# Patient Record
Sex: Male | Born: 1965
Health system: Southern US, Community
[De-identification: ages and names within clinical notes are randomized; demographics above are authoritative.]

## PROBLEM LIST (undated history)

## (undated) DIAGNOSIS — I6523 Occlusion and stenosis of bilateral carotid arteries: Secondary | ICD-10-CM

## (undated) DIAGNOSIS — M170 Bilateral primary osteoarthritis of knee: Secondary | ICD-10-CM

## (undated) DIAGNOSIS — C801 Malignant (primary) neoplasm, unspecified: Secondary | ICD-10-CM

## (undated) DIAGNOSIS — R7303 Prediabetes: Secondary | ICD-10-CM

## (undated) DIAGNOSIS — K760 Fatty (change of) liver, not elsewhere classified: Secondary | ICD-10-CM

## (undated) DIAGNOSIS — F32A Depression, unspecified: Secondary | ICD-10-CM

## (undated) DIAGNOSIS — E119 Type 2 diabetes mellitus without complications: Secondary | ICD-10-CM

## (undated) DIAGNOSIS — F419 Anxiety disorder, unspecified: Secondary | ICD-10-CM

## (undated) DIAGNOSIS — G43909 Migraine, unspecified, not intractable, without status migrainosus: Secondary | ICD-10-CM

## (undated) DIAGNOSIS — G4730 Sleep apnea, unspecified: Secondary | ICD-10-CM

## (undated) DIAGNOSIS — R0789 Other chest pain: Secondary | ICD-10-CM

## (undated) DIAGNOSIS — K2970 Gastritis, unspecified, without bleeding: Secondary | ICD-10-CM

## (undated) DIAGNOSIS — K219 Gastro-esophageal reflux disease without esophagitis: Secondary | ICD-10-CM

## (undated) DIAGNOSIS — I428 Other cardiomyopathies: Secondary | ICD-10-CM

## (undated) DIAGNOSIS — E785 Hyperlipidemia, unspecified: Secondary | ICD-10-CM

## (undated) DIAGNOSIS — I509 Heart failure, unspecified: Secondary | ICD-10-CM

## (undated) DIAGNOSIS — D131 Benign neoplasm of stomach: Secondary | ICD-10-CM

## (undated) DIAGNOSIS — I10 Essential (primary) hypertension: Secondary | ICD-10-CM

## (undated) DIAGNOSIS — G894 Chronic pain syndrome: Secondary | ICD-10-CM

## (undated) DIAGNOSIS — R0602 Shortness of breath: Secondary | ICD-10-CM

## (undated) DIAGNOSIS — E669 Obesity, unspecified: Secondary | ICD-10-CM

## (undated) DIAGNOSIS — R7989 Other specified abnormal findings of blood chemistry: Secondary | ICD-10-CM

## (undated) DIAGNOSIS — I2510 Atherosclerotic heart disease of native coronary artery without angina pectoris: Secondary | ICD-10-CM

## (undated) DIAGNOSIS — I639 Cerebral infarction, unspecified: Secondary | ICD-10-CM

## (undated) DIAGNOSIS — G4733 Obstructive sleep apnea (adult) (pediatric): Secondary | ICD-10-CM

## (undated) DIAGNOSIS — R945 Abnormal results of liver function studies: Secondary | ICD-10-CM

## (undated) HISTORY — DX: Essential (primary) hypertension: I10

## (undated) HISTORY — DX: Fatty (change of) liver, not elsewhere classified: K76.0

## (undated) HISTORY — DX: Type 2 diabetes mellitus without complications: E11.9

## (undated) HISTORY — PX: CARDIAC CATHETERIZATION: SHX172

## (undated) HISTORY — PX: COLONOSCOPY: SHX174

## (undated) HISTORY — DX: Benign neoplasm of stomach: D13.1

## (undated) HISTORY — DX: Malignant (primary) neoplasm, unspecified: C80.1

## (undated) HISTORY — DX: Other specified abnormal findings of blood chemistry: R79.89

## (undated) HISTORY — DX: Gastritis, unspecified, without bleeding: K29.70

## (undated) HISTORY — DX: Other cardiomyopathies: I42.8

## (undated) HISTORY — DX: Gastro-esophageal reflux disease without esophagitis: K21.9

## (undated) HISTORY — DX: Abnormal results of liver function studies: R94.5

## (undated) HISTORY — DX: Obesity, unspecified: E66.9

## (undated) HISTORY — DX: Sleep apnea, unspecified: G47.30

## (undated) HISTORY — DX: Hyperlipidemia, unspecified: E78.5

---

## 2003-06-09 ENCOUNTER — Inpatient Hospital Stay (HOSPITAL_COMMUNITY): Admission: EM | Admit: 2003-06-09 | Discharge: 2003-06-13 | Payer: Self-pay | Admitting: *Deleted

## 2003-08-31 ENCOUNTER — Emergency Department (HOSPITAL_COMMUNITY): Admission: EM | Admit: 2003-08-31 | Discharge: 2003-08-31 | Payer: Self-pay | Admitting: Emergency Medicine

## 2006-02-03 DIAGNOSIS — I219 Acute myocardial infarction, unspecified: Secondary | ICD-10-CM

## 2006-02-03 HISTORY — DX: Acute myocardial infarction, unspecified: I21.9

## 2007-03-24 ENCOUNTER — Ambulatory Visit: Payer: Self-pay | Admitting: Gastroenterology

## 2008-05-14 ENCOUNTER — Emergency Department: Payer: Self-pay | Admitting: Emergency Medicine

## 2008-12-20 ENCOUNTER — Ambulatory Visit: Payer: Self-pay | Admitting: Cardiovascular Disease

## 2008-12-20 ENCOUNTER — Observation Stay (HOSPITAL_COMMUNITY): Admission: EM | Admit: 2008-12-20 | Discharge: 2009-01-01 | Payer: Self-pay | Admitting: Emergency Medicine

## 2008-12-22 ENCOUNTER — Encounter (INDEPENDENT_AMBULATORY_CARE_PROVIDER_SITE_OTHER): Payer: Self-pay | Admitting: Internal Medicine

## 2008-12-22 ENCOUNTER — Ambulatory Visit: Payer: Self-pay | Admitting: Vascular Surgery

## 2008-12-25 ENCOUNTER — Encounter: Admission: RE | Admit: 2008-12-25 | Discharge: 2008-12-25 | Payer: Self-pay | Admitting: Internal Medicine

## 2008-12-26 ENCOUNTER — Encounter: Admission: RE | Admit: 2008-12-26 | Discharge: 2008-12-26 | Payer: Self-pay | Admitting: Internal Medicine

## 2009-02-03 ENCOUNTER — Emergency Department: Payer: Self-pay | Admitting: Internal Medicine

## 2009-03-14 ENCOUNTER — Emergency Department: Payer: Self-pay | Admitting: Emergency Medicine

## 2009-04-02 ENCOUNTER — Ambulatory Visit: Payer: Self-pay | Admitting: Gastroenterology

## 2009-04-30 ENCOUNTER — Ambulatory Visit: Payer: Self-pay | Admitting: Gastroenterology

## 2009-10-09 ENCOUNTER — Observation Stay: Payer: Self-pay | Admitting: Internal Medicine

## 2009-10-29 ENCOUNTER — Ambulatory Visit: Payer: Self-pay | Admitting: Gastroenterology

## 2009-10-31 LAB — PATHOLOGY REPORT

## 2010-05-08 LAB — BASIC METABOLIC PANEL
BUN: 14 mg/dL (ref 6–23)
CO2: 26 mEq/L (ref 19–32)
CO2: 27 mEq/L (ref 19–32)
Calcium: 8.6 mg/dL (ref 8.4–10.5)
Calcium: 9.1 mg/dL (ref 8.4–10.5)
GFR calc Af Amer: >60 mL/min (ref >60)
GFR calc non Af Amer: >60 mL/min (ref >60)
GFR calc non Af Amer: >60 mL/min (ref >60)
Glucose, Bld: 155 mg/dL — ABNORMAL HIGH (ref 70–99)
Glucose, Bld: 99 mg/dL (ref 70–99)
Potassium: 4.2 mEq/L (ref 3.5–5.1)
Potassium: 4.5 mEq/L (ref 3.5–5.1)
Sodium: 138 mEq/L (ref 135–145)

## 2010-05-08 LAB — DIFFERENTIAL
Basophils Relative: 1 % (ref 0–1)
Eosinophils Absolute: 0.1 10*3/uL (ref 0.0–0.7)
Lymphocytes Relative: 32 % (ref 12–46)
Lymphs Abs: 2.5 10*3/uL (ref 0.7–4.0)
Monocytes Absolute: 0.7 10*3/uL (ref 0.1–1.0)
Monocytes Relative: 9 % (ref 3–12)

## 2010-05-08 LAB — COMPREHENSIVE METABOLIC PANEL
AST: 115 U/L — ABNORMAL HIGH (ref 0–37)
AST: 45 U/L — ABNORMAL HIGH (ref 0–37)
Albumin: 3.4 g/dL — ABNORMAL LOW (ref 3.5–5.2)
Albumin: 3.6 g/dL (ref 3.5–5.2)
Alkaline Phosphatase: 77 U/L (ref 39–117)
BUN: 14 mg/dL (ref 6–23)
BUN: 15 mg/dL (ref 6–23)
CO2: 24 mEq/L (ref 19–32)
Calcium: 8.4 mg/dL (ref 8.4–10.5)
Calcium: 9.3 mg/dL (ref 8.4–10.5)
Creatinine, Ser: 0.9 mg/dL (ref 0.4–1.5)
Creatinine, Ser: 1.06 mg/dL (ref 0.4–1.5)
Creatinine, Ser: 1.13 mg/dL (ref 0.4–1.5)
GFR calc Af Amer: >60 mL/min (ref >60)
GFR calc non Af Amer: >60 mL/min (ref >60)
Glucose, Bld: 167 mg/dL — ABNORMAL HIGH (ref 70–99)
Glucose, Bld: 92 mg/dL (ref 70–99)
Potassium: 4.7 mEq/L (ref 3.5–5.1)
Sodium: 135 mEq/L (ref 135–145)
Total Bilirubin: 1 mg/dL (ref 0.3–1.2)
Total Protein: 6.4 g/dL (ref 6.0–8.3)
Total Protein: 7.1 g/dL (ref 6.0–8.3)

## 2010-05-08 LAB — CBC
HCT: 40 % (ref 39.0–52.0)
HCT: 41.4 % (ref 39.0–52.0)
HCT: 42.2 % (ref 39.0–52.0)
HCT: 43 % (ref 39.0–52.0)
Hemoglobin: 13.9 g/dL (ref 13.0–17.0)
Hemoglobin: 14.5 g/dL (ref 13.0–17.0)
Hemoglobin: 14.5 g/dL (ref 13.0–17.0)
Hemoglobin: 14.6 g/dL (ref 13.0–17.0)
Hemoglobin: 15.2 g/dL (ref 13.0–17.0)
MCHC: 34.4 g/dL (ref 30.0–36.0)
MCHC: 34.5 g/dL (ref 30.0–36.0)
MCHC: 34.7 g/dL (ref 30.0–36.0)
MCHC: 34.7 g/dL (ref 30.0–36.0)
MCV: 88.9 fL (ref 78.0–100.0)
MCV: 89.2 fL (ref 78.0–100.0)
MCV: 89.2 fL (ref 78.0–100.0)
Platelets: 225 10*3/uL (ref 150–400)
Platelets: 256 10*3/uL (ref 150–400)
RBC: 4.5 MIL/uL (ref 4.22–5.81)
RBC: 4.7 MIL/uL (ref 4.22–5.81)
RBC: 4.73 MIL/uL (ref 4.22–5.81)
RBC: 4.92 MIL/uL (ref 4.22–5.81)
RDW: 13.6 % (ref 11.5–15.5)
RDW: 13.7 % (ref 11.5–15.5)
RDW: 13.9 % (ref 11.5–15.5)
RDW: 14 % (ref 11.5–15.5)
RDW: 14.2 % (ref 11.5–15.5)
WBC: 7.9 10*3/uL (ref 4.0–10.5)

## 2010-05-08 LAB — HEMOGLOBIN A1C: Mean Plasma Glucose: 128 mg/dL

## 2010-05-08 LAB — CARDIAC PANEL(CRET KIN+CKTOT+MB+TROPI)
CK, MB: 1.2 ng/mL (ref 0.3–4.0)
Relative Index: 0.9 (ref 0.0–2.5)
Relative Index: 1.1 (ref 0.0–2.5)
Troponin I: 0.01 ng/mL (ref 0.00–0.06)

## 2010-05-08 LAB — HEPATIC FUNCTION PANEL
ALT: 133 U/L — ABNORMAL HIGH (ref 0–53)
ALT: 190 U/L — ABNORMAL HIGH (ref 0–53)
AST: 107 U/L — ABNORMAL HIGH (ref 0–37)
AST: 46 U/L — ABNORMAL HIGH (ref 0–37)
Albumin: 3.8 g/dL (ref 3.5–5.2)
Alkaline Phosphatase: 78 U/L (ref 39–117)
Bilirubin, Direct: <0.1 mg/dL (ref 0.0–0.3)
Total Bilirubin: 0.7 mg/dL (ref 0.3–1.2)
Total Protein: 6.8 g/dL (ref 6.0–8.3)

## 2010-05-08 LAB — TSH: TSH: 6.901 u[IU]/mL — ABNORMAL HIGH (ref 0.350–4.500)

## 2010-05-08 LAB — LIPID PANEL
HDL: 34 mg/dL — ABNORMAL LOW (ref >39)
LDL Cholesterol: 146 mg/dL — ABNORMAL HIGH (ref 0–99)
Triglycerides: 144 mg/dL (ref <150)

## 2010-05-08 LAB — POCT CARDIAC MARKERS: Troponin i, poc: <0.05 ng/mL (ref 0.00–0.09)

## 2010-05-08 LAB — GAMMA GT: GGT: 45 U/L (ref 7–51)

## 2010-05-08 LAB — CK TOTAL AND CKMB (NOT AT ARMC)
CK, MB: 1.8 ng/mL (ref 0.3–4.0)
Relative Index: 1.2 (ref 0.0–2.5)
Total CK: 152 U/L (ref 7–232)

## 2010-06-21 NOTE — Discharge Summary (Signed)
NAME:  Gerald [name], Gerald [name] NO.:  [id number]   MEDICAL RECORD NO.:  [id number]                   PATIENT TYPE:  INP   LOCATION:  5036                                 FACILITY:  [facility]   PHYSICIAN:  Michael L. Thad Ranger, M.D.           DATE OF BIRTH:  Dec 05, 1965   DATE OF ADMISSION:  06/08/2003  DATE OF DISCHARGE:  06/13/2003                                 DISCHARGE SUMMARY   ADMISSION DIAGNOSES:  1. Headache, nausea, and visual changes, possible subarachnoid hemorrhage     versus migraine.  2. Subconjunctival hemorrhage.   DISCHARGE DIAGNOSES:  1. Paroxysmal unilateral headache, precise etiology uncertain.  Subarachnoid     hemorrhage excluded.  2. Subconjunctival hemorrhage.   CONDITION AT DISCHARGE:  Improved.   DIET:  Regular.   ACTIVITY:  Ad lib.   MEDICATIONS AT DISCHARGE:  1. Depakote ER 500 mg p.o. q.h.s.  2. Indocin 50 mg t.i.d. with food x 10 days.  3. Vicodin one to two q.4-6h. p.r.n. as needed.   STUDIES:  1. CT of the head, noncontrast, on Jun 08, 2003, with no abnormalities.  2. MRI of the brain on Jun 08, 2003, negative.  3. __________ intracranial circulation on Jun 08, 2003.  Slight narrowing in     the left ICA just below the skull base, otherwise negative.  4. Cerebral angiogram on Jun 09, 2003, negative.  5. EKG on Jun 10, 2003, normal.   LABORATORY REVIEW:  CBC unremarkable.  Sedimentation rate 6.  Coagulation  times unremarkable.  Chemistries unremarkable, except for an elevated  glucose likely due to steroids and elevated AST and ALT.  Compliment studies  were normal.  Rickettsia titers negative.  ANA negative.   PROCEDURES:  Lumbar puncture performed in the ER on Jun 08, 2003, with clear  fluid, 0-6 red cells, 1 white cell, and normal protein and glucose.   HOSPITAL COURSE:  Please see the admission H&P for full admission details.  Briefly, this is a 45 year old male who was admitted after experiencing  sudden onset of a  right hemicranial headache.  He simultaneously noted a  subconjunctival hemorrhage in the right eye.  His initially workup in the ER  included an extensive workup for subarachnoid hemorrhage which was negative.  Nevertheless, he was admitted and then underwent cerebral angiography the  next day which demonstrated no evidence of a cerebral aneurysm.  He was  placed on intravenous steroid and valproic with relief of his pain.  He  continued to have problems with severe headaches over the next couple of  days.  He was empirically tried on Indocin, which did result in some  improvement.  He was also felt to have significant underlying anxiety, which  was treated symptomatically.  By Jun 11, 2003, his headaches were getting  somewhat milder.  On Jun 11, 2003, because his headaches persisted, a trial  of DHU was undertaken and this resulted in considerable  improvement.  By Jun 13, 2003, his headache syndrome was well controlled and he was felt stable  for discharge.   FOLLOWUP:  He was advised to call [facility] for an appointment  with Darrol Angel, N.P./Dr. Thad Ranger in two weeks and to call sooner if  need be.                                                Michael L. Thad Ranger, M.D.    MLR/MEDQ  D:  07/28/2003  T:  07/30/2003  Job:  [id number]

## 2010-09-04 HISTORY — PX: CARDIAC CATHETERIZATION: SHX172

## 2010-09-09 ENCOUNTER — Observation Stay: Payer: Self-pay | Admitting: Student

## 2011-03-12 ENCOUNTER — Ambulatory Visit: Payer: Self-pay | Admitting: Cardiology

## 2011-04-15 ENCOUNTER — Ambulatory Visit: Payer: Self-pay | Admitting: Family Medicine

## 2011-05-25 ENCOUNTER — Emergency Department: Payer: Self-pay | Admitting: Emergency Medicine

## 2011-09-04 HISTORY — PX: CARDIAC CATHETERIZATION: SHX172

## 2011-09-12 ENCOUNTER — Observation Stay: Payer: Self-pay | Admitting: Internal Medicine

## 2011-09-12 LAB — CBC
MCH: 29.6 pg (ref 26.0–34.0)
MCHC: 35.1 g/dL (ref 32.0–36.0)
MCV: 84 fL (ref 80–100)
Platelet: 232 10*3/uL (ref 150–440)
RBC: 4.91 10*6/uL (ref 4.40–5.90)
RDW: 14.6 % — ABNORMAL HIGH (ref 11.5–14.5)

## 2011-09-12 LAB — CK TOTAL AND CKMB (NOT AT ARMC)
CK, Total: 164 U/L (ref 35–232)
CK, Total: 101 U/L (ref 35–232)
CK-MB: 1 ng/mL (ref 0.5–3.6)
CK-MB: 0.5 ng/mL (ref 0.5–3.6)

## 2011-09-12 LAB — BASIC METABOLIC PANEL
Calcium, Total: 9 mg/dL (ref 8.5–10.1)
EGFR (African American): >60
EGFR (Non-African Amer.): >60
Glucose: 131 mg/dL — ABNORMAL HIGH (ref 65–99)
Potassium: 3.7 mmol/L (ref 3.5–5.1)
Sodium: 140 mmol/L (ref 136–145)

## 2011-09-12 LAB — TROPONIN I
Troponin-I: <0.02 ng/mL
Troponin-I: <0.02 ng/mL

## 2011-09-12 LAB — APTT: Activated PTT: 30.6 secs (ref 23.6–35.9)

## 2011-09-13 LAB — CBC WITH DIFFERENTIAL/PLATELET
Basophil #: 0.1 10*3/uL (ref 0.0–0.1)
Basophil %: 0.7 %
Eosinophil %: 1 %
Lymphocyte #: 2.5 10*3/uL (ref 1.0–3.6)
MCH: 29.8 pg (ref 26.0–34.0)
MCHC: 34.9 g/dL (ref 32.0–36.0)
MCV: 85 fL (ref 80–100)
Monocyte %: 6.8 %
Neutrophil #: 5.3 10*3/uL (ref 1.4–6.5)
Neutrophil %: 61.6 %
Platelet: 218 10*3/uL (ref 150–440)
RBC: 4.83 10*6/uL (ref 4.40–5.90)
RDW: 15.1 % — ABNORMAL HIGH (ref 11.5–14.5)
WBC: 8.5 10*3/uL (ref 3.8–10.6)

## 2011-09-13 LAB — LIPID PANEL
Ldl Cholesterol, Calc: 115 mg/dL — ABNORMAL HIGH (ref 0–100)
Triglycerides: 137 mg/dL (ref 0–200)

## 2011-09-13 LAB — HEMOGLOBIN A1C: Hemoglobin A1C: 6.8 % — ABNORMAL HIGH (ref 4.2–6.3)

## 2011-09-13 LAB — BASIC METABOLIC PANEL
Anion Gap: 7 (ref 7–16)
Calcium, Total: 8.3 mg/dL — ABNORMAL LOW (ref 8.5–10.1)
Co2: 28 mmol/L (ref 21–32)
EGFR (African American): >60
EGFR (Non-African Amer.): >60
Glucose: 114 mg/dL — ABNORMAL HIGH (ref 65–99)
Osmolality: 272 (ref 275–301)

## 2011-09-13 LAB — MAGNESIUM: Magnesium: 1.9 mg/dL

## 2011-09-13 LAB — APTT: Activated PTT: 68.2 secs — ABNORMAL HIGH (ref 23.6–35.9)

## 2012-03-30 ENCOUNTER — Encounter: Payer: Self-pay | Admitting: Family Medicine

## 2012-03-30 ENCOUNTER — Ambulatory Visit (INDEPENDENT_AMBULATORY_CARE_PROVIDER_SITE_OTHER): Payer: 59 | Admitting: Family Medicine

## 2012-03-30 VITALS — BP 140/90 | HR 76 | Temp 97.7°F | Ht 72.5 in | Wt 323.0 lb

## 2012-03-30 DIAGNOSIS — Z136 Encounter for screening for cardiovascular disorders: Secondary | ICD-10-CM

## 2012-03-30 DIAGNOSIS — G5790 Unspecified mononeuropathy of unspecified lower limb: Secondary | ICD-10-CM

## 2012-03-30 DIAGNOSIS — G894 Chronic pain syndrome: Secondary | ICD-10-CM

## 2012-03-30 DIAGNOSIS — M792 Neuralgia and neuritis, unspecified: Secondary | ICD-10-CM

## 2012-03-30 DIAGNOSIS — I2510 Atherosclerotic heart disease of native coronary artery without angina pectoris: Secondary | ICD-10-CM | POA: Insufficient documentation

## 2012-03-30 DIAGNOSIS — I10 Essential (primary) hypertension: Secondary | ICD-10-CM

## 2012-03-30 LAB — COMPREHENSIVE METABOLIC PANEL
AST: 30 U/L (ref 0–37)
BUN: 15 mg/dL (ref 6–23)
Calcium: 8.8 mg/dL (ref 8.4–10.5)
Chloride: 104 mEq/L (ref 96–112)
Creatinine, Ser: 1 mg/dL (ref 0.4–1.5)
GFR: 82.85 mL/min (ref >60.00)

## 2012-03-30 LAB — LIPID PANEL
Cholesterol: 199 mg/dL (ref 0–200)
Triglycerides: 276 mg/dL — ABNORMAL HIGH (ref 0.0–149.0)
VLDL: 55.2 mg/dL — ABNORMAL HIGH (ref 0.0–40.0)

## 2012-03-30 LAB — LDL CHOLESTEROL, DIRECT: Direct LDL: 132.1 mg/dL

## 2012-03-30 MED ORDER — METOPROLOL SUCCINATE ER 25 MG PO TB24: 25.0000 mg | ORAL_TABLET | Freq: Every day | ORAL | Status: DC

## 2012-03-30 MED ORDER — ASPIRIN 81 MG PO TBEC: 81.0000 mg | DELAYED_RELEASE_TABLET | Freq: Every day | ORAL | Status: DC

## 2012-03-30 NOTE — Patient Instructions (Addendum)
We are starting Metoprolol 25 mg daily and Aspirin 81 mg daily( over the counter).  I will call you with your lab results. We will likely start cholesterol medication.  We will call you with your lab results.  Please come see me in 2 weeks.

## 2012-03-30 NOTE — Progress Notes (Signed)
Subjective:    Patient ID: Gerald [name], male    DOB: 1965/10/16, 47 y.o.   MRN: [id number]  HPI 47 yo male here to establish care.  Has not been to a PCP in years.  CAD- per pt, presented to [facility] in 09/2011.  He was told he had a heart attack and had a heart cath which he states looked "pretty good." Never followed up with cardiology and is not taking any medication.  Since then, he has had intermittent episodes of DOE, fleeting exertional CP. Pos FH of CAD- dad had MI in his 5s. Non smoker.  Under significant stress- wife is on disability for bipolar disorder.  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 They have a 51 year old son.  He denies feeling depressed or anxious- has a strong faith.  Does complain of tingling in his feet bilaterally for years.  Patient Active Problem List  Diagnosis  . CAD (coronary artery disease)  . Chronic pain syndrome  . HTN (hypertension)   Past Medical History  Diagnosis Date  . Myocardial infarction 09/2011  . Hypertension    Past Surgical History  Procedure Laterality Date  . Cardiac catheterization N/A 09/2011   History  Substance Use Topics  . Smoking status: Never Smoker   . Smokeless tobacco: Not on file  . Alcohol Use: Not on file   Family History  Problem Relation Age of Onset  . Heart disease Father 6    MI   No Known Allergies No current outpatient prescriptions on file prior to visit.   No current facility-administered medications on file prior to visit.   The PMH, PSH, Social History, Family History, Medications, and allergies have been reviewed in [facility], and have been updated if relevant.      Review of Systems See HPI Patient reports no  vision/ hearing changes,anorexia, weight change, fever ,adenopathy, persistant / recurrent hoarseness, swallowing issues, , edema,persistant / recurrent cough, hemoptysis, dyspnea(rest, exertional, paroxysmal nocturnal),  gastrointestinal  bleeding (melena, rectal bleeding), abdominal pain, excessive heart burn, GU symptoms(dysuria, hematuria, pyuria, voiding/incontinence  Issues) syncope, focal weakness, severe memory loss, concerning skin lesions, depression, anxiety, abnormal bruising/bleeding, major joint swelling.       Objective:   Physical Exam BP 140/90  Pulse 76  Temp(Src) 97.7 F (36.5 C)  Ht 6' 0.5" (1.842 m)  Wt 323 lb (146.512 kg)  BMI 43.18 kg/m2 General:  overweght male in NAD Eyes:  PERRL Ears:  External ear exam shows no significant lesions or deformities.  Otoscopic examination reveals clear canals, tympanic membranes are intact bilaterally without bulging, retraction, inflammation or discharge. Hearing is grossly normal bilaterally. Nose:  External nasal examination shows no deformity or inflammation. Nasal mucosa are pink and moist without lesions or exudates. Mouth:  Oral mucosa and oropharynx without lesions or exudates.  Teeth in good repair. Neck:  no carotid bruit or thyromegaly no cervical or supraclavicular lymphadenopathy  Lungs:  Normal respiratory effort, chest expands symmetrically. Lungs are clear to auscultation, no crackles or wheezes. Heart:  Normal rate and regular rhythm. S1 and S2 normal without gallop, murmur, click, rub or other extra sounds. Abdomen:  Bowel sounds positive,abdomen soft and non-tender without masses, organomegaly or hernias noted. Pulses:  R and L posterior tibial pulses are full and equal bilaterally  Extremities:  no edema     Assessment & Plan:  1. CAD (coronary artery disease) On no RX! Will start metoprolol 25  mg ER daily, ASA 81 mg daily.  Lipid panel today.  Records from [facility] requested. - Lipid Panel - Comprehensive metabolic panel  2. Screening for ischemic heart disease - Lipid Panel  3. Neuropathic pain of foot, unspecified laterality - Hemoglobin A1c  4. Chronic pain syndrome On no rx  5. HTN (hypertension) On no rx.  Start  Metoprolol today.  Follow up with me in 2 weeks. The patient indicates understanding of these issues and agrees with the plan.

## 2012-04-15 ENCOUNTER — Telehealth: Payer: Self-pay

## 2012-04-15 ENCOUNTER — Ambulatory Visit (INDEPENDENT_AMBULATORY_CARE_PROVIDER_SITE_OTHER): Payer: 59 | Admitting: Family Medicine

## 2012-04-15 ENCOUNTER — Encounter: Payer: Self-pay | Admitting: Family Medicine

## 2012-04-15 VITALS — BP 140/80 | HR 83 | Temp 98.0°F | Ht 72.5 in | Wt 323.0 lb

## 2012-04-15 DIAGNOSIS — E119 Type 2 diabetes mellitus without complications: Secondary | ICD-10-CM

## 2012-04-15 DIAGNOSIS — I10 Essential (primary) hypertension: Secondary | ICD-10-CM

## 2012-04-15 DIAGNOSIS — I2510 Atherosclerotic heart disease of native coronary artery without angina pectoris: Secondary | ICD-10-CM

## 2012-04-15 MED ORDER — METFORMIN HCL 500 MG PO TABS: 500.0000 mg | ORAL_TABLET | Freq: Two times a day (BID) | ORAL | Status: DC

## 2012-04-15 MED ORDER — METOPROLOL SUCCINATE ER 25 MG PO TB24: 25.0000 mg | ORAL_TABLET | Freq: Every day | ORAL | Status: DC

## 2012-04-15 NOTE — Telephone Encounter (Signed)
Noted! Thank you

## 2012-04-15 NOTE — Patient Instructions (Signed)
Good to see you. We are starting Metformin 500 mg twice daily. Continue Metoprolol. Please come back in 3 months- we will recheck you a1c and cholesterol prior to that appointment.

## 2012-04-15 NOTE — Progress Notes (Signed)
Subjective:    Patient ID: Gerald [name], male    DOB: 1965-04-07, 47 y.o.   MRN: [id number]  HPI 47 yo male with h/o CAD and HTN, here for two week follow up.  Established care with me after not having seen a PCP in years.    HTN- BP only mildly improved since we started metoprolol 25 mg daily but he has not taken his medication today and rushed to get here. Denies any HA or blurred vision and states that he feels his BP is better controlled.   CAD- per pt, presented to [facility] in 09/2011. He was told he had a heart attack and had a heart cath which he states looked "pretty good."  Never followed up with cardiology and is not taking any medication. Since then, he has had intermittent episodes of DOE, fleeting exertional CP.  Pos FH of CAD- dad had MI in his 44s.  Non smoker. Was previously on a statin, cannot remember which one- gave him a rash. Lab Results  Component Value Date   CHOL 199 03/30/2012   HDL 30.10* 03/30/2012   LDLCALC  Value: 146        Total Cholesterol/HDL:CHD Risk Coronary Heart Disease Risk Table                     Men   Women  1/2 Average Risk   3.4   3.3  Average Risk       5.0   4.4  2 X Average Risk   9.6   7.1  3 X Average Risk  23.4   11.0        Use the calculated Patient Ratio above and the CHD Risk Table to determine the patient's CHD Risk.        ATP III CLASSIFICATION (LDL):  <100     mg/dL   Optimal  409-811  mg/dL   Near or Above                    Optimal  130-159  mg/dL   Borderline  914-782  mg/dL   High  >956     mg/dL   Very High* 21/30/8657   LDLDIRECT 132.1 03/30/2012   TRIG 276.0* 03/30/2012   CHOLHDL 7 03/30/2012     BP was elevated and he was on no rx for CAD.  We started Metoprolol 25 mg ER daily and ASA.  Here is here for follow up.    DM- now onset.  a1c two weeks ago was 6.8.  Denies any increased thirst or urination. Wife is a diabetic.  He has an extra meter and test strips at home.    Patient Active Problem List  Diagnosis  . CAD (coronary  artery disease)  . Chronic pain syndrome  . HTN (hypertension)   Past Medical History  Diagnosis Date  . Myocardial infarction 09/2011  . Hypertension    Past Surgical History  Procedure Laterality Date  . Cardiac catheterization N/A 09/2011   History  Substance Use Topics  . Smoking status: Never Smoker   . Smokeless tobacco: Not on file  . Alcohol Use: Not on file   Family History  Problem Relation Age of Onset  . Heart disease Father 56    MI   No Known Allergies Current Outpatient Prescriptions on File Prior to Visit  Medication Sig Dispense Refill  . aspirin 81 MG EC tablet Take 1 tablet (81 mg total) by mouth daily. Swallow  whole.  30 tablet  12  . metoprolol succinate (TOPROL-XL) 25 MG 24 hr tablet Take 1 tablet (25 mg total) by mouth daily.  90 tablet  3   No current facility-administered medications on file prior to visit.   The PMH, PSH, Social History, Family History, Medications, and allergies have been reviewed in [facility], and have been updated if relevant.      Review of Systems See HPI See HPI    Objective:   Physical Exam  BP 140/80  Pulse 83  Temp(Src) 98 F (36.7 C) (Oral)  Ht 6' 0.5" (1.842 m)  Wt 323 lb (146.512 kg)  BMI 43.18 kg/m2  SpO2 97%  General:  overweght male in NAD Eyes:  PERRL Ears:  External ear exam shows no significant lesions or deformities.  Otoscopic examination reveals clear canals, tympanic membranes are intact bilaterally without bulging, retraction, inflammation or discharge. Hearing is grossly normal bilaterally. Nose:  External nasal examination shows no deformity or inflammation. Nasal mucosa are pink and moist without lesions or exudates. Mouth:  Oral mucosa and oropharynx without lesions or exudates.  Teeth in good repair. Neck:  no carotid bruit or thyromegaly no cervical or supraclavicular lymphadenopathy  Lungs:  Normal respiratory effort, chest expands symmetrically. Lungs are clear to auscultation, no crackles or  wheezes. Heart:  Normal rate and regular rhythm. S1 and S2 normal without gallop, murmur, click, rub or other extra sounds. Abdomen:  Bowel sounds positive,abdomen soft and non-tender without masses, organomegaly or hernias noted. Pulses:  R and L posterior tibial pulses are full and equal bilaterally  Extremities:  no edema     Assessment & Plan:  1. CAD (coronary artery disease) Continue  metoprolol 25 mg ER daily, ASA 81 mg daily.   Will hold off on starting statin since he had a rash with one- he will call me back once he finds out which one it was.  2. HTN (hypertension) Stable on current dose of Metoprolol.   3. Diabetes mellitus, new onset New- declined diabetic teaching.  Discussed ranges and checking CBGs 3 times week and keeping log. Start Metformin 500 mg twice daily.  Follow up in 3 months. The patient indicates understanding of these issues and agrees with the plan.  - Hemoglobin A1c; Future

## 2012-04-15 NOTE — Telephone Encounter (Signed)
Pt seen this morning and when got home pt said already has Metformin 500 mg at home and will begin to take as instructed. No call back needed.

## 2012-05-07 ENCOUNTER — Encounter: Payer: Self-pay | Admitting: Family Medicine

## 2012-05-07 ENCOUNTER — Ambulatory Visit (INDEPENDENT_AMBULATORY_CARE_PROVIDER_SITE_OTHER): Payer: 59 | Admitting: Family Medicine

## 2012-05-07 ENCOUNTER — Telehealth: Payer: Self-pay

## 2012-05-07 VITALS — BP 130/72 | HR 80 | Temp 97.9°F | Ht 72.5 in | Wt 321.8 lb

## 2012-05-07 DIAGNOSIS — J209 Acute bronchitis, unspecified: Secondary | ICD-10-CM | POA: Insufficient documentation

## 2012-05-07 MED ORDER — AZITHROMYCIN 250 MG PO TABS: ORAL_TABLET | ORAL | Status: DC

## 2012-05-07 MED ORDER — BENZONATATE 100 MG PO CAPS: 200.0000 mg | ORAL_CAPSULE | Freq: Three times a day (TID) | ORAL | Status: DC | PRN

## 2012-05-07 NOTE — Telephone Encounter (Signed)
For 1 1/2 weeks pt had cough; OTC meds not helping. Today pt has consistent prod cough with yellow phlegm and pain on lt side of chest when coughs.No S/T or earache; ? If fever. Pt thinks may have gone to bronchitis and request appt. Dr Dayton Martes does not have available appt today and pt scheduled appt Dr Ermalene Searing today at 10:45 am.

## 2012-05-07 NOTE — Assessment & Plan Note (Addendum)
Treat with antibiotics since > 1 week of symptoms.  Tessalon perles for symptoms.  Zyrtec for any allergy contribution.

## 2012-05-07 NOTE — Patient Instructions (Addendum)
Complete a course of antibiotics.   Use tessalon perles for cough Try claritin or zyrtec fpr possible allergy contribution. Avoid decongestant. Call or follow up with PCP if not improving in next 5-7 days.

## 2012-05-07 NOTE — Progress Notes (Signed)
  Subjective:    Patient ID: Gerald [name], male    DOB: February 14, 1965, 47 y.o.   MRN: [id number]  Cough This is a new problem. The current episode started in the past 7 days (1 week). The problem has been gradually worsening. The problem occurs constantly. The cough is productive of sputum. Pertinent negatives include no chills, ear congestion, ear pain, fever, headaches, nasal congestion, postnasal drip, rhinorrhea, sore throat, shortness of breath or wheezing. Associated symptoms comments: Some SOB with coughing fits where he cannot stop . Exacerbated by: keeping him up when trying to sleep. Risk factors: Has flu in January. Treatments tried: Delsym, took some decongestant.. had BP elevation with this. The treatment provided mild relief. There is no history of asthma, COPD, emphysema, environmental allergies or pneumonia.    When he has been sick in past he gets some wheeze... Tried old albuterol inhaler that did not help with coughing fits.  No reflux, no allergies this time of years. Review of Systems  Constitutional: Negative for fever and chills.  HENT: Negative for ear pain, sore throat, rhinorrhea and postnasal drip.   Respiratory: Positive for cough. Negative for shortness of breath and wheezing.   Allergic/Immunologic: Negative for environmental allergies.  Neurological: Negative for headaches.       Objective:   Physical Exam  Constitutional: Vital signs are normal. He appears well-developed and well-nourished.  Non-toxic appearance. He does not appear ill. No distress.  HENT:  Head: Normocephalic and atraumatic.  Right Ear: Hearing, tympanic membrane, external ear and ear canal normal. No tenderness. No foreign bodies. Tympanic membrane is not retracted and not bulging.  Left Ear: Hearing, tympanic membrane, external ear and ear canal normal. No tenderness. No foreign bodies. Tympanic membrane is not retracted and not bulging.  Nose: Nose normal. No mucosal edema or rhinorrhea.  Right sinus exhibits no maxillary sinus tenderness and no frontal sinus tenderness. Left sinus exhibits no maxillary sinus tenderness and no frontal sinus tenderness.  Mouth/Throat: Uvula is midline, oropharynx is clear and moist and mucous membranes are normal. Normal dentition. No dental caries. No oropharyngeal exudate or tonsillar abscesses.  Eyes: Conjunctivae, EOM and lids are normal. Pupils are equal, round, and reactive to light. No foreign bodies found.  Neck: Trachea normal, normal range of motion and phonation normal. Neck supple. Carotid bruit is not present. No mass and no thyromegaly present.  Cardiovascular: Normal rate, regular rhythm, S1 normal, S2 normal, normal heart sounds, intact distal pulses and normal pulses.  Exam reveals no gallop.   No murmur heard. Pulmonary/Chest: Effort normal and breath sounds normal. No respiratory distress. He has no wheezes. He has no rhonchi. He has no rales.  Abdominal: Soft. Normal appearance and bowel sounds are normal. There is no hepatosplenomegaly. There is no tenderness. There is no rebound, no guarding and no CVA tenderness. No hernia.  Neurological: He is alert. He has normal reflexes.  Skin: Skin is warm, dry and intact. No rash noted.  Psychiatric: He has a normal mood and affect. His speech is normal and behavior is normal. Judgment normal.          Assessment & Plan:

## 2012-05-11 ENCOUNTER — Ambulatory Visit: Payer: 59 | Admitting: Family Medicine

## 2012-05-14 ENCOUNTER — Emergency Department: Payer: Self-pay | Admitting: Emergency Medicine

## 2012-05-14 ENCOUNTER — Telehealth: Payer: Self-pay | Admitting: Family Medicine

## 2012-05-14 NOTE — Telephone Encounter (Signed)
Yes ok to accommodate sooner.

## 2012-05-14 NOTE — Telephone Encounter (Signed)
Pt sch for CPE 05/28/2012

## 2012-05-14 NOTE — Telephone Encounter (Signed)
Pt needs to have a CPE and some paperwork completed for his employer (he works for [employer]) by no later than June 4th, however, your first available CPE is not until 07/08/2012. Can you accommodate him an apptmt prior to June 4th? Thank you.

## 2012-05-20 ENCOUNTER — Other Ambulatory Visit (INDEPENDENT_AMBULATORY_CARE_PROVIDER_SITE_OTHER): Payer: 59

## 2012-05-20 DIAGNOSIS — I2510 Atherosclerotic heart disease of native coronary artery without angina pectoris: Secondary | ICD-10-CM

## 2012-05-20 DIAGNOSIS — E119 Type 2 diabetes mellitus without complications: Secondary | ICD-10-CM

## 2012-05-20 LAB — LIPID PANEL
LDL Cholesterol: 132 mg/dL — ABNORMAL HIGH (ref 0–99)
Total CHOL/HDL Ratio: 7
Triglycerides: 145 mg/dL (ref 0.0–149.0)
VLDL: 29 mg/dL (ref 0.0–40.0)

## 2012-05-24 ENCOUNTER — Encounter: Payer: Self-pay | Admitting: *Deleted

## 2012-05-28 ENCOUNTER — Encounter: Payer: Self-pay | Admitting: Family Medicine

## 2012-05-28 ENCOUNTER — Telehealth: Payer: Self-pay

## 2012-05-28 ENCOUNTER — Ambulatory Visit (INDEPENDENT_AMBULATORY_CARE_PROVIDER_SITE_OTHER): Payer: 59 | Admitting: Family Medicine

## 2012-05-28 VITALS — BP 138/94 | HR 84 | Temp 98.0°F | Ht 73.0 in | Wt 320.0 lb

## 2012-05-28 DIAGNOSIS — E782 Mixed hyperlipidemia: Secondary | ICD-10-CM | POA: Insufficient documentation

## 2012-05-28 DIAGNOSIS — G894 Chronic pain syndrome: Secondary | ICD-10-CM

## 2012-05-28 DIAGNOSIS — E785 Hyperlipidemia, unspecified: Secondary | ICD-10-CM

## 2012-05-28 DIAGNOSIS — I10 Essential (primary) hypertension: Secondary | ICD-10-CM

## 2012-05-28 DIAGNOSIS — Z0000 Encounter for general adult medical examination without abnormal findings: Secondary | ICD-10-CM

## 2012-05-28 DIAGNOSIS — E119 Type 2 diabetes mellitus without complications: Secondary | ICD-10-CM

## 2012-05-28 DIAGNOSIS — I2510 Atherosclerotic heart disease of native coronary artery without angina pectoris: Secondary | ICD-10-CM

## 2012-05-28 MED ORDER — GLUCOSE BLOOD VI STRP: ORAL_STRIP | Status: DC

## 2012-05-28 MED ORDER — ONETOUCH ULTRA SYSTEM W/DEVICE KIT: 1.0000 | PACK | Freq: Once | Status: DC

## 2012-05-28 NOTE — Telephone Encounter (Signed)
Advised patient that he needs to see cardiologist to get cardiac clearance.  Dr. Dayton Martes can then finish filling out the forms.  Pt agreed to go to cardiologist.

## 2012-05-28 NOTE — Progress Notes (Signed)
Subjective:    Patient ID: Gerald [name], male    DOB: December 09, 1965, 47 y.o.   MRN: [id number]  HPI 47 yo male with h/o DM, CAD and HTN, here for CPX.  No family history of colon CA or prostate CA.  HTN- BP improved since we started metoprolol 25 mg daily, almost at goal for diabetic. Denies any HA or blurred vision and states that he feels his BP is better controlled.  CAD- per pt, presented to [facility] in 09/2011. He was told he had a heart attack and had a heart cath which he states looked "pretty good."   Pos FH of CAD- dad had MI in his 32s.   No recent CP or DOE.   Non smoker. Was previously on a statin, cannot remember which one- gave him a rash.  He is now taking ASA. Lab Results  Component Value Date   CHOL 190 05/20/2012   HDL 28.70* 05/20/2012   LDLCALC 132* 05/20/2012   LDLDIRECT 132.1 03/30/2012   TRIG 145.0 05/20/2012   CHOLHDL 7 05/20/2012     DM- now onset.  a1c was 6.8 at diagnosis in March, improved to 6.6 this month.   Denies any increased thirst or urination. On Metformin 500 mg twice daily. Has not been checking his CBGs. Denies episodes of hypoglycemia.  Patient Active Problem List  Diagnosis  . CAD (coronary artery disease)  . Chronic pain syndrome  . HTN (hypertension)  . Diabetes mellitus, new onset  . Routine general medical examination at a health care facility   Past Medical History  Diagnosis Date  . Myocardial infarction 09/2011  . Hypertension    Past Surgical History  Procedure Laterality Date  . Cardiac catheterization N/A 09/2011   History  Substance Use Topics  . Smoking status: Never Smoker   . Smokeless tobacco: Not on file  . Alcohol Use: Not on file   Family History  Problem Relation Age of Onset  . Heart disease Father 86    MI   Allergies  Allergen Reactions  . Statins     Rash    Current Outpatient Prescriptions on File Prior to Visit  Medication Sig Dispense Refill  . aspirin 81 MG EC tablet Take 1 tablet (81 mg total)  by mouth daily. Swallow whole.  30 tablet  12  . azithromycin (ZITHROMAX) 250 MG tablet 2 tab po x 1 day then 1 tab po daily  6 each  0  . benzonatate (TESSALON) 100 MG capsule Take 2 capsules (200 mg total) by mouth 3 (three) times daily as needed for cough.  30 capsule  0  . metFORMIN (GLUCOPHAGE) 500 MG tablet Take 1 tablet (500 mg total) by mouth 2 (two) times daily with a meal.  180 tablet  3  . metoprolol succinate (TOPROL-XL) 25 MG 24 hr tablet Take 1 tablet (25 mg total) by mouth daily.  90 tablet  3   No current facility-administered medications on file prior to visit.   The PMH, PSH, Social History, Family History, Medications, and allergies have been reviewed in [facility], and have been updated if relevant.      Review of Systems See HPI Patient reports no  vision/ hearing changes,anorexia, weight change, fever ,adenopathy, persistant / recurrent hoarseness, swallowing issues, chest pain, edema,persistant / recurrent cough, hemoptysis, dyspnea(rest, exertional, paroxysmal nocturnal), gastrointestinal  bleeding (melena, rectal bleeding), abdominal pain, excessive heart burn, GU symptoms(dysuria, hematuria, pyuria, voiding/incontinence  Issues) syncope, focal weakness, severe memory loss, concerning skin lesions,  depression, anxiety, abnormal bruising/bleeding, major joint swelling.       Objective:   Physical Exam  BP 138/94  Pulse 84  Temp(Src) 98 F (36.7 C)  Ht 6\' 1"  (1.854 m)  Wt 320 lb (145.151 kg)  BMI 42.23 kg/m2  General:  overweght male in NAD Eyes:  PERRL Ears:  External ear exam shows no significant lesions or deformities.  Otoscopic examination reveals clear canals, tympanic membranes are intact bilaterally without bulging, retraction, inflammation or discharge. Hearing is grossly normal bilaterally. Nose:  External nasal examination shows no deformity or inflammation. Nasal mucosa are pink and moist without lesions or exudates. Mouth:  Oral mucosa and oropharynx  without lesions or exudates.  Teeth in good repair. Neck:  no carotid bruit or thyromegaly no cervical or supraclavicular lymphadenopathy  Lungs:  Normal respiratory effort, chest expands symmetrically. Lungs are clear to auscultation, no crackles or wheezes. Heart:  Normal rate and regular rhythm. S1 and S2 normal without gallop, murmur, click, rub or other extra sounds. Abdomen:  Bowel sounds positive,abdomen soft and non-tender without masses, organomegaly or hernias noted. Pulses:  R and L posterior tibial pulses are full and equal bilaterally  Extremities:  no edema     Assessment & Plan:   1. Routine general medical examination at a health care facility Reviewed preventive care protocols, scheduled due services, and updated immunizations Discussed nutrition, exercise, diet, and healthy lifestyle. 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   2. Diabetes mellitus, new onset Continue Metformin 500 mg twice daily. a1c in July. Not checking CBGs because he states he does not have enough strips ( was using his wife's strips)- he will call back with name of meter to get test strips sent in.  3. HTN (hypertension) Almost at goal for diabetic.  Continue Metoprolol at current dose.  4. CAD (coronary artery disease) Continue  metoprolol 25 mg ER daily, ASA 81 mg daily.   Will hold off on starting statin since he had a rash with statins previously.  Refer to cardiology for further management.  - Ambulatory referral to Cardiology  5. HLD (hyperlipidemia) Not at goal for diabetic or CAD but intolerant to statins.  Referred to cards for CAD.  Will await their recs. The patient indicates understanding of these issues and agrees with the plan.  - Ambulatory referral to Cardiology

## 2012-05-28 NOTE — Telephone Encounter (Signed)
Gerald [name] wants to talk with Dr Dayton Martes about paperwork Gerald [name] brought in today; Gerald [name] said had spoken with [employer] about paperwork and big difference in what Gerald [name] has to do depending if box is marked yes or no. Several boxes Dr Dayton Martes did not place a mark and Gerald [name] wants to speak with Dr Dayton Martes or Gerald [name] can bring paperwork back to office to discuss. Gerald [name] request call back ASAP.

## 2012-05-28 NOTE — Telephone Encounter (Signed)
Gerald [name], please call pt to find out what we need to do. Thanks

## 2012-05-28 NOTE — Patient Instructions (Addendum)
Good to see you. Please start checking your blood sugars three times a week if possible. See Shirlee Limerick about your referral before you leave today. Once you see the cardiologist, bring your form back.

## 2012-05-31 ENCOUNTER — Encounter: Payer: Self-pay | Admitting: *Deleted

## 2012-06-01 ENCOUNTER — Encounter: Payer: Self-pay | Admitting: Cardiovascular Disease

## 2012-06-01 ENCOUNTER — Ambulatory Visit (INDEPENDENT_AMBULATORY_CARE_PROVIDER_SITE_OTHER): Payer: 59 | Admitting: Cardiovascular Disease

## 2012-06-01 VITALS — BP 132/100 | HR 73 | Ht 76.0 in | Wt 327.5 lb

## 2012-06-01 DIAGNOSIS — I428 Other cardiomyopathies: Secondary | ICD-10-CM | POA: Insufficient documentation

## 2012-06-01 DIAGNOSIS — E785 Hyperlipidemia, unspecified: Secondary | ICD-10-CM

## 2012-06-01 DIAGNOSIS — I2510 Atherosclerotic heart disease of native coronary artery without angina pectoris: Secondary | ICD-10-CM

## 2012-06-01 DIAGNOSIS — I10 Essential (primary) hypertension: Secondary | ICD-10-CM

## 2012-06-01 NOTE — Assessment & Plan Note (Signed)
Lab Results  Component Value Date   CHOL 190 05/20/2012   HDL 28.70* 05/20/2012   LDLCALC 132* 05/20/2012   LDLDIRECT 132.1 03/30/2012   TRIG 145.0 05/20/2012   CHOLHDL 7 05/20/2012   Unfortunately, he is allergic to statins due to rash. Continue with lifestyle changes at this point. I don't think there is strong evidence for benefit from non-statin medications in this situation.

## 2012-06-01 NOTE — Assessment & Plan Note (Signed)
He was reported to have moderately reduced LV systolic function but there is discrepancy in estimation of this between echocardiogram and cardiac catheterization. He is currently not having any symptoms suggestive of heart failure and does not seem to be fluid overloaded. Continue treatment with Toprol. Consider adding an ACE inhibitor given that he is also diabetic. I will plan on repeating his echocardiogram in about 6 months to evaluate his LV systolic function. He currently appears to be in [place] Heart Association class II with no significant limitations.

## 2012-06-01 NOTE — Patient Instructions (Addendum)
Continue same medications.  Follow up in 6 months.  

## 2012-06-01 NOTE — Progress Notes (Signed)
HPI  This is a 47 year old male who was referred by Dr. Dayton Martes to establish cardiovascular care. He use to see Dr.Khan in the past. He had cardiac catheterization done twice one in August of 2012 and most recently in August of 2013 for atypical chest pain. Both showed mild mid LAD stenosis without evidence of obstructive disease. He was reported to have reduced ejection fraction. Echocardiogram from last year showed an ejection fraction of 35-40%. By cardiac cath ejection fraction was 50%. The patient denies any chest pain, dyspnea, orthopnea or PND. There is no lower extremity edema. He is not aware of any previous history of congestive heart. He was diagnosed recently with diabetes. He has known history of hypertension and currently on small dose Toprol.  Allergies  Allergen Reactions  . Statins     Rash      Current Outpatient Prescriptions on File Prior to Visit  Medication Sig Dispense Refill  . aspirin 81 MG EC tablet Take 1 tablet (81 mg total) by mouth daily. Swallow whole.  30 tablet  12  . Blood Glucose Monitoring Suppl (ONE TOUCH ULTRA SYSTEM KIT) W/DEVICE KIT 1 kit by Does not apply route once.  1 each  0  . glucose blood test strip Use as instructed  100 each  12  . metFORMIN (GLUCOPHAGE) 500 MG tablet Take 1 tablet (500 mg total) by mouth 2 (two) times daily with a meal.  180 tablet  3  . metoprolol succinate (TOPROL-XL) 25 MG 24 hr tablet Take 1 tablet (25 mg total) by mouth daily.  90 tablet  3   No current facility-administered medications on file prior to visit.     Past Medical History  Diagnosis Date  . Myocardial infarction 09/2011  . Hypertension   . Hyperlipidemia   . Obesity   . Diabetes mellitus without complication     borderline  . Sleep apnea   . Coronary artery disease     Mild nonobstructive coronary artery disease on previous cardiac catheterization most recently in August of 2013  . Nonischemic cardiomyopathy     Previous ejection fraction of  35-40% on echo. 50% on cardiac catheterization in August of 2013     Past Surgical History  Procedure Laterality Date  . Colonoscopy    . Cardiac catheterization N/A 09/2011    [facility]; EF 50% with 30% mid LAD stenosis and no obstructive disease.  . Cardiac catheterization  09/2010    [facility]; Mid LAD 40% stenosis; Mid Circumflex:Normal; Mid RCA; Normal     Family History  Problem Relation Age of Onset  . Heart disease Father 74    MI  . Heart attack Father   . Hypertension Mother      History   Social History  . Marital Status: Single    Spouse Name: N/A    Number of Children: N/A  . Years of Education: N/A   Occupational History  . Not on file.   Social History Main Topics  . Smoking status: Never Smoker   . Smokeless tobacco: Not on file  . Alcohol Use: Yes     Comment: rare  . Drug Use: No  . Sexually Active: Not on file   Other Topics Concern  . Not on file   Social History Narrative   Married.  35 yo son.   Wife on disability for bipolar disorder.       ROS Constitutional: Negative for fever, chills, diaphoresis, activity change, appetite change and fatigue.  HENT: Negative for hearing loss, nosebleeds, congestion, sore throat, facial swelling, drooling, trouble swallowing, neck pain, voice change, sinus pressure and tinnitus.  Eyes: Negative for photophobia, pain, discharge and visual disturbance.  Respiratory: Negative for apnea, cough, chest tightness, shortness of breath and wheezing.  Cardiovascular: Negative for chest pain, palpitations and leg swelling.  Gastrointestinal: Negative for nausea, vomiting, abdominal pain, diarrhea, constipation, blood in stool and abdominal distention.  Genitourinary: Negative for dysuria, urgency, frequency, hematuria and decreased urine volume.  Musculoskeletal: Negative for myalgias, back pain, joint swelling, arthralgias and gait problem.  Skin: Negative for color change, pallor, rash and wound.  Neurological:  Negative for dizziness, tremors, seizures, syncope, speech difficulty, weakness, light-headedness, numbness and headaches.  Psychiatric/Behavioral: Negative for suicidal ideas, hallucinations, behavioral problems and agitation. The patient is not nervous/anxious.     PHYSICAL EXAM   BP 132/100  Pulse 73  Ht 6\' 4"  (1.93 m)  Wt 327 lb 8 oz (148.553 kg)  BMI 39.88 kg/m2 Constitutional: He is oriented to person, place, and time. He appears well-developed and well-nourished. No distress.  HENT: No nasal discharge.  Head: Normocephalic and atraumatic.  Eyes: Pupils are equal and round. Right eye exhibits no discharge. Left eye exhibits no discharge.  Neck: Normal range of motion. Neck supple. No JVD present. No thyromegaly present.  Cardiovascular: Normal rate, regular rhythm, normal heart sounds and. Exam reveals no gallop and no friction rub. No murmur heard.  Pulmonary/Chest: Effort normal and breath sounds normal. No stridor. No respiratory distress. He has no wheezes. He has no rales. He exhibits no tenderness.  Abdominal: Soft. Bowel sounds are normal. He exhibits no distension. There is no tenderness. There is no rebound and no guarding.  Musculoskeletal: Normal range of motion. He exhibits no edema and no tenderness.  Neurological: He is alert and oriented to person, place, and time. Coordination normal.  Skin: Skin is warm and dry. No rash noted. He is not diaphoretic. No erythema. No pallor.  Psychiatric: He has a normal mood and affect. His behavior is normal. Judgment and thought content normal.       EKG: Normal sinus rhythm with no significant ST or T wave changes.   ASSESSMENT AND PLAN

## 2012-06-01 NOTE — Assessment & Plan Note (Signed)
The patient had mild disease in the LAD on previous cardiac catheterization. Continue small dose aspirin. Unfortunately, he is allergic to statins due to rash reported with 3 different statins. He is currently not having any symptoms suggestive of angina.

## 2012-06-01 NOTE — Assessment & Plan Note (Signed)
Consider adding an ACE inhibitor or an ARB as outlined above

## 2012-06-03 ENCOUNTER — Emergency Department (HOSPITAL_COMMUNITY): Payer: 59

## 2012-06-03 ENCOUNTER — Encounter (HOSPITAL_COMMUNITY): Payer: Self-pay

## 2012-06-03 ENCOUNTER — Observation Stay (HOSPITAL_COMMUNITY)
Admission: EM | Admit: 2012-06-03 | Discharge: 2012-06-04 | Disposition: A | Discharge status: Home / Self Care | Payer: 59 | Attending: Cardiology | Admitting: Cardiology

## 2012-06-03 ENCOUNTER — Telehealth: Payer: Self-pay | Admitting: *Deleted

## 2012-06-03 DIAGNOSIS — I10 Essential (primary) hypertension: Secondary | ICD-10-CM | POA: Diagnosis present

## 2012-06-03 DIAGNOSIS — R0609 Other forms of dyspnea: Secondary | ICD-10-CM

## 2012-06-03 DIAGNOSIS — I5023 Acute on chronic systolic (congestive) heart failure: Secondary | ICD-10-CM

## 2012-06-03 DIAGNOSIS — I2510 Atherosclerotic heart disease of native coronary artery without angina pectoris: Secondary | ICD-10-CM | POA: Diagnosis present

## 2012-06-03 DIAGNOSIS — Z6841 Body Mass Index (BMI) 40.0 and over, adult: Secondary | ICD-10-CM | POA: Insufficient documentation

## 2012-06-03 DIAGNOSIS — E669 Obesity, unspecified: Secondary | ICD-10-CM | POA: Insufficient documentation

## 2012-06-03 DIAGNOSIS — E785 Hyperlipidemia, unspecified: Secondary | ICD-10-CM | POA: Insufficient documentation

## 2012-06-03 DIAGNOSIS — I509 Heart failure, unspecified: Secondary | ICD-10-CM

## 2012-06-03 DIAGNOSIS — R0989 Other specified symptoms and signs involving the circulatory and respiratory systems: Secondary | ICD-10-CM | POA: Insufficient documentation

## 2012-06-03 DIAGNOSIS — I428 Other cardiomyopathies: Secondary | ICD-10-CM | POA: Insufficient documentation

## 2012-06-03 DIAGNOSIS — R079 Chest pain, unspecified: Principal | ICD-10-CM | POA: Diagnosis present

## 2012-06-03 DIAGNOSIS — I252 Old myocardial infarction: Secondary | ICD-10-CM | POA: Insufficient documentation

## 2012-06-03 DIAGNOSIS — R609 Edema, unspecified: Secondary | ICD-10-CM | POA: Insufficient documentation

## 2012-06-03 DIAGNOSIS — E782 Mixed hyperlipidemia: Secondary | ICD-10-CM | POA: Diagnosis present

## 2012-06-03 DIAGNOSIS — E119 Type 2 diabetes mellitus without complications: Secondary | ICD-10-CM | POA: Insufficient documentation

## 2012-06-03 LAB — COMPREHENSIVE METABOLIC PANEL
ALT: 57 U/L — ABNORMAL HIGH (ref 0–53)
AST: 30 U/L (ref 0–37)
Albumin: 3.7 g/dL (ref 3.5–5.2)
Alkaline Phosphatase: 90 U/L (ref 39–117)
Potassium: 3.9 mEq/L (ref 3.5–5.1)
Sodium: 140 mEq/L (ref 135–145)
Total Protein: 7 g/dL (ref 6.0–8.3)

## 2012-06-03 LAB — URINALYSIS, ROUTINE W REFLEX MICROSCOPIC
Glucose, UA: NEGATIVE mg/dL
Hgb urine dipstick: NEGATIVE
Ketones, ur: NEGATIVE mg/dL
Leukocytes, UA: NEGATIVE
pH: 5 (ref 5.0–8.0)

## 2012-06-03 LAB — GLUCOSE, CAPILLARY: Glucose-Capillary: 128 mg/dL — ABNORMAL HIGH (ref 70–99)

## 2012-06-03 LAB — CBC
MCHC: 35.6 g/dL (ref 30.0–36.0)
Platelets: 211 10*3/uL (ref 150–400)
RDW: 14 % (ref 11.5–15.5)
WBC: 7 10*3/uL (ref 4.0–10.5)

## 2012-06-03 LAB — POCT I-STAT TROPONIN I

## 2012-06-03 MED ORDER — ASPIRIN 81 MG PO TBEC
81.0000 mg | DELAYED_RELEASE_TABLET | Freq: Every day | ORAL | Status: DC
  Administered 2012-06-04: 81 mg via ORAL
  Filled 2012-06-03: qty 1

## 2012-06-03 MED ORDER — ENOXAPARIN SODIUM 40 MG/0.4ML ~~LOC~~ SOLN
40.0000 mg | SUBCUTANEOUS | Status: DC
  Administered 2012-06-03: 40 mg via SUBCUTANEOUS
  Filled 2012-06-03 (×2): qty 0.4

## 2012-06-03 MED ORDER — MORPHINE SULFATE 4 MG/ML IJ SOLN
4.0000 mg | Freq: Once | INTRAMUSCULAR | Status: AC
  Administered 2012-06-03: 4 mg via INTRAVENOUS
  Filled 2012-06-03: qty 1

## 2012-06-03 MED ORDER — ACETAMINOPHEN 325 MG PO TABS: 650.0000 mg | ORAL_TABLET | ORAL | Status: DC | PRN

## 2012-06-03 MED ORDER — SODIUM CHLORIDE 0.9 % IV SOLN
20.0000 mL | INTRAVENOUS | Status: DC
  Administered 2012-06-03: 1000 mL via INTRAVENOUS
  Administered 2012-06-03: 20 mL via INTRAVENOUS

## 2012-06-03 MED ORDER — FUROSEMIDE 10 MG/ML IJ SOLN
40.0000 mg | Freq: Two times a day (BID) | INTRAMUSCULAR | Status: DC
  Administered 2012-06-03 – 2012-06-04 (×3): 40 mg via INTRAVENOUS
  Filled 2012-06-03 (×5): qty 4

## 2012-06-03 MED ORDER — METOPROLOL SUCCINATE ER 25 MG PO TB24
25.0000 mg | ORAL_TABLET | Freq: Every day | ORAL | Status: DC
  Administered 2012-06-03: 25 mg via ORAL
  Filled 2012-06-03 (×2): qty 1

## 2012-06-03 MED ORDER — NITROGLYCERIN IN D5W 200-5 MCG/ML-% IV SOLN
2.0000 ug/min | INTRAVENOUS | Status: DC
  Administered 2012-06-04: 10 ug/min via INTRAVENOUS
  Filled 2012-06-03: qty 250

## 2012-06-03 MED ORDER — NITROGLYCERIN 0.4 MG SL SUBL
SUBLINGUAL_TABLET | SUBLINGUAL | Status: AC
  Filled 2012-06-03: qty 25

## 2012-06-03 MED ORDER — LISINOPRIL 5 MG PO TABS
5.0000 mg | ORAL_TABLET | Freq: Every day | ORAL | Status: DC
  Administered 2012-06-03: 5 mg via ORAL
  Filled 2012-06-03 (×2): qty 1

## 2012-06-03 MED ORDER — ONDANSETRON HCL 4 MG/2ML IJ SOLN: 4.0000 mg | Freq: Four times a day (QID) | INTRAMUSCULAR | Status: DC | PRN

## 2012-06-03 MED ORDER — MORPHINE SULFATE 4 MG/ML IJ SOLN
4.0000 mg | INTRAMUSCULAR | Status: DC | PRN
  Administered 2012-06-03 – 2012-06-04 (×2): 4 mg via INTRAVENOUS
  Filled 2012-06-03 (×3): qty 1

## 2012-06-03 MED ORDER — POTASSIUM CHLORIDE CRYS ER 20 MEQ PO TBCR
20.0000 meq | EXTENDED_RELEASE_TABLET | Freq: Two times a day (BID) | ORAL | Status: DC
  Administered 2012-06-03 – 2012-06-04 (×3): 20 meq via ORAL
  Filled 2012-06-03 (×5): qty 1

## 2012-06-03 MED ORDER — NITROGLYCERIN 0.4 MG SL SUBL
SUBLINGUAL_TABLET | SUBLINGUAL | Status: AC
  Administered 2012-06-03: 18:00:00
  Filled 2012-06-03: qty 25

## 2012-06-03 NOTE — ED Notes (Signed)
sts went to cardiologist 2 days ago for routine visit.

## 2012-06-03 NOTE — ED Notes (Signed)
Pt sts his chest pain has returned.  md aware.  New orders obtained.

## 2012-06-03 NOTE — ED Notes (Signed)
EMS-pt was at home feeding dog developed worsening chest pressure/shortness of breath.  sts that he has c/p that started yesterday am at work.  Feels more like chest pressure radiating to left arm.  enroute was given 8mg  morphine, nitro x 5, ASA 324 at home.  Pain now 4/10. 18g/rac.  Hx of same last August with cath but no stent was placed.

## 2012-06-03 NOTE — Telephone Encounter (Signed)
Increased sob and chest heaviness. Would like to talk to RN asap

## 2012-06-03 NOTE — Telephone Encounter (Signed)
Pt c/o chest heaviness, "like someone is sitting on my chest" associated with SOB.  Has felt this way since 0200 4/30 Also c/o left arm numbness I discussed with Dr. Kirke Corin who advised to have pt go to ER Pt informed Understanding verb

## 2012-06-03 NOTE — ED Provider Notes (Signed)
History     CSN: [id number]  Arrival date & time 06/03/12  1038   First MD Initiated Contact with Patient 06/03/12 1058      Chief Complaint  Patient presents with  . Chest Pain    (Consider location/radiation/quality/duration/timing/severity/associated sxs/prior treatment) HPI Comments: Gerald [name] is a 47 y.o. Male reports waxing and waning chest sugars since yesterday morning. It gets worse when he is working. Today he is having severe chest pain, 10 out of 10, and call his cardiologist. They recommended that he come to the hospital. Shortly after that he collapsed to the floor. His wife found him shortly thereafter, and he was alert. He was sitting with his back against the wall. She gave him 4 baby aspirin. EMS arrived and gave him nitroglycerin. The chest pain has decreased to 4/10, on arrival.recently he has noticed general tiredness, and dyspnea on exertion when walking. He saw his cardiologist, 2 days ago, to establish care. No interventions were done. His cardiologist plans on doing a cardiac echo in 6 months to followup on an ejection fraction of 50% on his last cardiac catheterization. His last cardiac catheterization was in 2013 and showed nonobstructive coronary artery disease. He works as an Nature conservation officer at [employer]. There's been no recent fever, chills, nausea, vomiting, cough, dysuria, or diarrhea. No other known modifying factors.  Patient is a 47 y.o. male presenting with chest pain. The history is provided by the patient.  Chest Pain   Past Medical History  Diagnosis Date  . Myocardial infarction 09/2011  . Hypertension   . Hyperlipidemia   . Obesity   . Diabetes mellitus without complication     borderline  . Sleep apnea   . Coronary artery disease     Mild nonobstructive coronary artery disease on previous cardiac catheterization most recently in August of 2013  . Nonischemic cardiomyopathy     Previous ejection fraction of 35-40% on echo. 50%  on cardiac catheterization in August of 2013    Past Surgical History  Procedure Laterality Date  . Colonoscopy    . Cardiac catheterization N/A 09/2011    [facility]; EF 50% with 30% mid LAD stenosis and no obstructive disease.  . Cardiac catheterization  09/2010    [facility]; Mid LAD 40% stenosis; Mid Circumflex:Normal; Mid RCA; Normal    Family History  Problem Relation Age of Onset  . Heart disease Father 71    MI  . Heart attack Father   . Hypertension Mother     History  Substance Use Topics  . Smoking status: Never Smoker   . Smokeless tobacco: Not on file  . Alcohol Use: Yes     Comment: rare      Review of Systems  Cardiovascular: Positive for chest pain.  All other systems reviewed and are negative.    Allergies  Statins  Home Medications   Current Outpatient Rx  Name  Route  Sig  Dispense  Refill  . aspirin 81 MG EC tablet   Oral   Take 81 mg by mouth daily. Swallow whole.         . metFORMIN (GLUCOPHAGE) 500 MG tablet   Oral   Take 1 tablet (500 mg total) by mouth 2 (two) times daily with a meal.   180 tablet   3   . metoprolol succinate (TOPROL-XL) 25 MG 24 hr tablet   Oral   Take 1 tablet (25 mg total) by mouth daily.   90 tablet  3   . Blood Glucose Monitoring Suppl (ONE TOUCH ULTRA SYSTEM KIT) W/DEVICE KIT   Does not apply   1 kit by Does not apply route once.   1 each   0   . glucose blood test strip      Use as instructed   100 each   12     BP 154/86  Pulse 77  Temp(Src) 97.8 F (36.6 C) (Oral)  Resp 19  SpO2 96%  Physical Exam  Nursing note and vitals reviewed. Constitutional: He is oriented to person, place, and time. He appears well-developed.  obese  HENT:  Head: Normocephalic and atraumatic.  Right Ear: External ear normal.  Left Ear: External ear normal.  Eyes: Conjunctivae and EOM are normal. Pupils are equal, round, and reactive to light.  Neck: Normal range of motion and phonation normal. Neck supple.   Cardiovascular: Normal rate, regular rhythm, normal heart sounds and intact distal pulses.   Pulmonary/Chest: Effort normal and breath sounds normal. No respiratory distress. He has no wheezes. He has no rales. He exhibits tenderness (Mild left anterior, and left posterior chest wall tenderness). He exhibits no bony tenderness.  Abdominal: Soft. Normal appearance. There is no tenderness.  Musculoskeletal: Normal range of motion. He exhibits edema (2+ bilateral lower legs).  Neurological: He is alert and oriented to person, place, and time. He has normal strength. No cranial nerve deficit or sensory deficit. He exhibits normal muscle tone. Coordination normal.  Skin: Skin is warm, dry and intact.  Psychiatric: His behavior is normal. Judgment and thought content normal.  He talks slowly, and appears depressed    ED Course  Procedures (including critical care time)  Medications  0.9 %  sodium chloride infusion (20 mLs Intravenous New Bag/Given 06/03/12 1215)  morphine 4 MG/ML injection 4 mg (4 mg Intravenous Given 06/03/12 1214)    12:10-patient requested pain medicine, morphine ordered.   Reevaluation- 12:50- he states the morphine helped some, but his pain is still present at 5/10.   Consultation- 12:55- [place] cardiologist, will see the patient.   Date: 10:48  Rate: 76  Rhythm: normal sinus rhythm  QRS Axis: normal  PR and QT Intervals: normal  ST/T Wave abnormalities: normal  PR and QRS Conduction Disutrbances:none  Narrative Interpretation:   Old EKG Reviewed: unchanged- 09/08/10   Labs Reviewed  CBC - Abnormal; Notable for the following:    HCT 37.6 (*)    All other components within normal limits  COMPREHENSIVE METABOLIC PANEL - Abnormal; Notable for the following:    Glucose, Bld 111 (*)    ALT 57 (*)    GFR calc non Af Amer 87 (*)    All other components within normal limits  URINALYSIS, ROUTINE W REFLEX MICROSCOPIC - Abnormal; Notable for the following:    Specific  Gravity, Urine 1.033 (*)    All other components within normal limits  D-DIMER, QUANTITATIVE  POCT I-STAT TROPONIN I   Dg Chest Portable 1 View  06/03/2012  *RADIOLOGY REPORT*  Clinical Data: Chest pain, shortness of breath  PORTABLE CHEST - 1 VIEW  Comparison: 12/20/2008  Findings: Borderline cardiomegaly.  Elevation of the right hemidiaphragm.  No acute infiltrate or pleural effusion.  No pulmonary edema.  IMPRESSION: Borderline cardiomegaly.  No active disease.   Original Report Authenticated By: Natasha Mead, M.D.      1. Chest pain   2. Dyspnea on exertion   3. Peripheral edema       MDM  Nonspecific  chest pain, atypical for coronary artery disease, and infarct pain. Initial evaluation in the ED, negative; pain persists. Cardiology consultation indicated. To see the patient in the ED.  Nursing Notes Reviewed/ Care Coordinated, and agree without changes. Applicable Imaging Reviewed.  Interpretation of Laboratory Data incorporated into ED treatment  Plan: As per cardiology        Flint Melter, MD 06/03/12 1256

## 2012-06-03 NOTE — H&P (Signed)
History and Physical  Patient ID: Gerald [name] MRN: [id number], SOB: 01/07/1966 46 y.o. Date of Encounter: 06/03/2012, 2:28 PM  Primary Physician: Ruthe Mannan, MD Primary Cardiologist: Dr. Kary Kos  Chief Complaint: Chest pain  HPI: 47 y.o. male w/ PMHx significant for HTN, HL, DM2, OSA, CAD, who presented to [facility] on 06/03/2012 with complaints of chest pain.  The patient notes 24 hours of constant, dull, left-sided "pressure"-like chest pain, radiating to his back, 7/10 in severity, mildly worse when sitting down, unchanged by exertion, unchanged by food.  Today, at 9:30 am, while at rest, the patient experienced sudden onset of acute chest pain, described as left-sided chest pain "like someone sitting on my chest", radiating to his left posterior shoulder and left arm with associated left hand numbness, 10/10 in severity, associated with dyspnea, nausea, diaphoresis, and confusion.  The patient took 4 baby aspirin.  The pain persisted until EMS arrived and administered sublingual nitro, which decreased the pain initially to an 8/10, then further to a 5/10, where it has remained since that time.  The patient notes a 1-week history of SOB when walking 1 block, which is new for the patient.  At baseline, the patient notes that his job requires him to walk on flat ground for most of his daily 12-hour shift, and he notes no cp or SOB with this activity typically.  He notes stable 2-pillow orthopnea for > 1 year, and PND about once every 2 weeks (stable for > 1 year).  He notes mild LE edema for the past 1 year.    The patient has a history of CAD, with cardiac cath 09/12/11 showing 30-40% stenosis at the LAD, circumflex, and RAD, with 2 stents noted in LAD and circumflex.    EKG revealed NSR with no acute ST changes. CXR was without acute cardiopulmonary abnormalities. Labs are significant for negative troponin x1.   Past Medical History  Diagnosis Date  . Myocardial infarction 09/2011  .  Hypertension   . Hyperlipidemia   . Obesity   . Diabetes mellitus without complication     borderline  . Sleep apnea   . Coronary artery disease     Mild nonobstructive coronary artery disease on previous cardiac catheterization most recently in August of 2013  . Nonischemic cardiomyopathy     Previous ejection fraction of 35-40% on echo. 50% on cardiac catheterization in August of 2013     Surgical History:  Past Surgical History  Procedure Laterality Date  . Colonoscopy    . Cardiac catheterization N/A 09/2011    [facility]; EF 50% with 30% mid LAD stenosis and no obstructive disease.  . Cardiac catheterization  09/2010    [facility]; Mid LAD 40% stenosis; Mid Circumflex:Normal; Mid RCA; Normal     Home Meds: Prior to Admission medications   Medication Sig Start Date End Date Taking? Authorizing Provider  aspirin 81 MG EC tablet Take 81 mg by mouth daily. Swallow whole. 03/30/12  Yes Dianne Dun, MD  metFORMIN (GLUCOPHAGE) 500 MG tablet Take 1 tablet (500 mg total) by mouth 2 (two) times daily with a meal. 04/15/12  Yes Dianne Dun, MD  metoprolol succinate (TOPROL-XL) 25 MG 24 hr tablet Take 1 tablet (25 mg total) by mouth daily. 04/15/12  Yes Dianne Dun, MD  Blood Glucose Monitoring Suppl (ONE TOUCH ULTRA SYSTEM KIT) W/DEVICE KIT 1 kit by Does not apply route once. 05/28/12   Dianne Dun, MD  glucose blood test strip  Use as instructed 05/28/12   Dianne Dun, MD    Allergies:  Allergies  Allergen Reactions  . Statins     Rash     History   Social History  . Marital Status: Single    Spouse Name: N/A    Number of Children: N/A  . Years of Education: N/A   Occupational History  . Not on file.   Social History Main Topics  . Smoking status: Never Smoker   . Smokeless tobacco: Not on file  . Alcohol Use: Yes     Comment: rare  . Drug Use: No  . Sexually Active: Not on file   Other Topics Concern  . Not on file   Social History Narrative   Married.  21 yo son.   Wife  on disability for bipolar disorder.       Family History  Problem Relation Age of Onset  . Heart disease Father 46    MI  . Heart attack Father   . Hypertension Mother     Review of Systems: General: negative for chills, fever, night sweats or weight changes.  ENT: negative for rhinorrhea or epistaxis Cardiovascular: see HPI Dermatological: negative for rash Respiratory: negative for cough or wheezing GI: +occasional heartburn, negative for nausea, vomiting, diarrhea, bright red blood per rectum, melena, or hematemesis GU: no hematuria, urgency, or frequency Neurologic: negative for visual changes, syncope, headache, or dizziness Heme: no easy bruising or bleeding Endo: negative for excessive thirst, thyroid disorder, or flushing Musculoskeletal: negative for joint pain or swelling, negative for myalgias All other systems reviewed and are otherwise negative except as noted above.  Physical Exam: Blood pressure 154/86, pulse 77, temperature 97.8 F (36.6 C), temperature source Oral, resp. rate 19, SpO2 96.00%. General: Well developed, well nourished, lying in bed, appears uncomfortable HEENT: Normocephalic, atraumatic, sclera non-icteric, no xanthomas, nares are without discharge.  Neck: Supple. Carotids 2+ without bruits. JVP normal Lungs: mildly increased work of respiration, particularly after talking or moving for physical exam.  Clear bilaterally to auscultation without wheezes, rales, or rhonchi. Heart: RRR with normal S1 and S2. No murmurs, rubs, or gallops appreciated. Abdomen: Soft, moderate tenderness to epigastric palpation, non-distended with normoactive bowel sounds. No hepatomegaly. No rebound/guarding. No obvious abdominal masses. Back: No CVA tenderness Msk:  Strength and tone appear normal for age. Extremities: trace bilateral non-pitting edema.  Distal pedal pulses are 2+ and equal bilaterally. Neuro: CNII-XII intact, moves all extremities spontaneously. Psych:   Responds to questions appropriately with a normal affect.   Labs:   Lab Results  Component Value Date   WBC 7.0 06/03/2012   HGB 13.4 06/03/2012   HCT 37.6* 06/03/2012   MCV 82.5 06/03/2012   PLT 211 06/03/2012    Recent Labs Lab 06/03/12 1122  NA 140  K 3.9  CL 104  CO2 28  BUN 16  CREATININE 1.02  CALCIUM 9.3  PROT 7.0  BILITOT 0.6  ALKPHOS 90  ALT 57*  AST 30  GLUCOSE 111*   No results found for this basename: CKTOTAL, CKMB, TROPONINI,  in the last 72 hours Lab Results  Component Value Date   CHOL 190 05/20/2012   HDL 28.70* 05/20/2012   LDLCALC 132* 05/20/2012   TRIG 145.0 05/20/2012   Lab Results  Component Value Date   DDIMER 0.27 06/03/2012  Troponin: 0.00  Hb A1C: 6.6 (05/20/12)  Radiology/Studies:  Dg Chest Portable 1 View  06/03/2012  *RADIOLOGY REPORT*  Clinical Data: Chest pain,  shortness of breath  PORTABLE CHEST - 1 VIEW  Comparison: 12/20/2008  Findings: Borderline cardiomegaly.  Elevation of the right hemidiaphragm.  No acute infiltrate or pleural effusion.  No pulmonary edema.  IMPRESSION: Borderline cardiomegaly.  No active disease.   Original Report Authenticated By: Natasha Mead, M.D.    Echo Report 09/12/11 - EF 35-45%, trace MR, mild septal hypokinesis   EKG: NSR, non-specific T-wave changes in III, avf  ASSESSMENT AND PLAN:  The patient is a 47 yo M, history of HTN, HL, DM, presenting with chest pain.  # Chest pain - the patient notes sudden onset of left-sided chest pain, with anginal qualities (left-sided pressure, radiating to left arm, relieved by nitro).  The patient has risk factors of HTN, HL, FH (father MI age 32), as well as DM (A1C = 6.6, 05/20/12) and prior 30-40% stenosis.  His TIMI score is 2 (CAD RF's, aspirin), giving him an 8.3% risk of adverse event at 14 days.  Differential diagnosis includes unstable angina vs early NSTEMI vs GERD (pt notes heartburn, epigastric tenderness on exam) vs CHF (EF 35-45%, but stable orthopnea, no pulm edema on CXR)  vs pericarditis/pericardial effusion (though no EKG changes to suggest pericarditis).  No evidence of PE (d-dimer negative, low prob wells score).  Unlikely aortic dissection (based on description of pain). -admit to telemetry -continue to cycle troponins -repeat EKG in AM -continue aspirin daily -continue patient's home metoprolol -pt has statin allergy -continue morphine prn, oxygen  # Loss of consciousness - pt notes LOC after bending over while having CP.  The etiology of this is unclear, but may have represented neurocardiogenic syncope in the setting of bending over while having acute pain and distress.  No evidence for arrythmia vs tamponade vs PE. -continue to monitor via telemetry  # HTN - chronic, BP mildly elevated presently -continue home metoprolol -if BP remains elevated, add lisinopril  # HL - recent lipid panel 2 weeks ago shows LDL = 132, HDL = 28.7.  10-year ASCVD risk = 10.4%, patient is a candidate for high intensity statin, but has allergy to statin.  # DM - A1C = 6.6, patient on metformin outpatient therapy -hold metformin -monitor CBG's, no insulin for now, can add later if CBG's elevated  # OSA - patient notes history of sleep study diagnosing him with OSA, though currently not on CPAP  # Prophy - heparin  Signed, Janalyn Harder PGY2 06/03/2012, 2:28 PM  Patient seen with resident, agree with the above note. Patient has had chest heaviness for about a day now, troponin and D dimer negative, ECG unremarkable.   1. Chest heaviness: 2 caths (2012 and 2013) with nonobstructive CAD.  Suspect this is not ACS as he has had prolonged pain with normal enzymes and unremarkable ECG.  I will continue to cycle cardiac enzymes, continue ASA.   2. CHF: Suspect acute on chronic systolic CHF.  Last echo with EF 35-40% (nonischemic cardiomyopathy).  He has elevated JVP and peripheral edema.  CHF may be the cause of his chest heaviness.  - Start Lasix 40 mg IV bid with KCl 20 mEq  bid.  - Echo, BNP - Continue Toprol XL, add lisinopril 5 mg daily.   Marca Ancona 06/03/2012 3:35 PM

## 2012-06-03 NOTE — ED Notes (Signed)
Report given to Emily, rn

## 2012-06-04 DIAGNOSIS — I379 Nonrheumatic pulmonary valve disorder, unspecified: Secondary | ICD-10-CM

## 2012-06-04 LAB — CBC
HCT: 38.6 % — ABNORMAL LOW (ref 39.0–52.0)
Hemoglobin: 13.6 g/dL (ref 13.0–17.0)
MCH: 29.1 pg (ref 26.0–34.0)
MCHC: 35.2 g/dL (ref 30.0–36.0)
MCV: 82.5 fL (ref 78.0–100.0)
Platelets: 222 K/uL (ref 150–400)
RBC: 4.68 MIL/uL (ref 4.22–5.81)
RDW: 14.3 % (ref 11.5–15.5)
WBC: 8.1 K/uL (ref 4.0–10.5)

## 2012-06-04 LAB — TROPONIN I: Troponin I: <0.3 ng/mL

## 2012-06-04 LAB — BASIC METABOLIC PANEL
BUN: 15 mg/dL (ref 6–23)
Chloride: 101 mEq/L (ref 96–112)
Glucose, Bld: 116 mg/dL — ABNORMAL HIGH (ref 70–99)
Potassium: 3.8 mEq/L (ref 3.5–5.1)
Sodium: 139 mEq/L (ref 135–145)

## 2012-06-04 LAB — PROTIME-INR
INR: 1.14 (ref 0.00–1.49)
Prothrombin Time: 14.4 s (ref 11.6–15.2)

## 2012-06-04 MED ORDER — GEMFIBROZIL 600 MG PO TABS: 600.0000 mg | ORAL_TABLET | Freq: Two times a day (BID) | ORAL | Status: DC

## 2012-06-04 MED ORDER — LISINOPRIL 5 MG PO TABS: 5.0000 mg | ORAL_TABLET | Freq: Every day | ORAL | Status: DC

## 2012-06-04 MED ORDER — FUROSEMIDE 20 MG PO TABS: 20.0000 mg | ORAL_TABLET | Freq: Every day | ORAL | Status: DC

## 2012-06-04 MED ORDER — BIOTENE DRY MOUTH MT LIQD
15.0000 mL | Freq: Two times a day (BID) | OROMUCOSAL | Status: DC
  Administered 2012-06-04: 15 mL via OROMUCOSAL

## 2012-06-04 MED ORDER — KETOROLAC TROMETHAMINE 30 MG/ML IJ SOLN
30.0000 mg | Freq: Once | INTRAMUSCULAR | Status: AC
  Administered 2012-06-04: 30 mg via INTRAVENOUS
  Filled 2012-06-04: qty 1

## 2012-06-04 MED ORDER — NITROGLYCERIN 0.4 MG SL SUBL: 0.4000 mg | SUBLINGUAL_TABLET | SUBLINGUAL | Status: DC | PRN

## 2012-06-04 MED ORDER — POTASSIUM CHLORIDE CRYS ER 20 MEQ PO TBCR: 20.0000 meq | EXTENDED_RELEASE_TABLET | Freq: Every day | ORAL | Status: DC

## 2012-06-04 MED ORDER — GEMFIBROZIL 600 MG PO TABS
600.0000 mg | ORAL_TABLET | Freq: Two times a day (BID) | ORAL | Status: DC
  Administered 2012-06-04: 600 mg via ORAL
  Filled 2012-06-04 (×2): qty 1

## 2012-06-04 NOTE — Progress Notes (Signed)
Subjective: Patient is still with CP  Pressure  L arm.  Worse on L side  Stabbing   Objective: Filed Vitals:   06/04/12 0500 06/04/12 0600 06/04/12 0700 06/04/12 0900  BP: 97/57 100/56 114/69 100/55  Pulse:      Temp:    97.6 F (36.4 C)  TempSrc:    Oral  Resp: 16 0 0 0  Height:      Weight:      SpO2: 97% 97% 97%    Weight change:   Intake/Output Summary (Last 24 hours) at 06/04/12 1110 Last data filed at 06/04/12 0900  Gross per 24 hour  Intake 348.25 ml  Output   3000 ml  Net -2651.75 ml    General: Alert, awake, oriented x3, in no acute distress Neck:  JVP is normal Heart: Regular rate and rhythm, without murmurs, rubs, gallops.  Chest  Tender to palpitation L lateral chest.  This brings on/makes worse the pain he has been having Lungs: Clear to auscultation.  No rales or wheezes. Exemities:  No edema.   Neuro: Grossly intact, nonfocal.  Tele:  SR Lab Results: Results for orders placed during the hospital encounter of 06/03/12 (from the past 24 hour(s))  CBC     Status: Abnormal   Collection Time    06/03/12 11:22 AM      Result Value Range   WBC 7.0  4.0 - 10.5 K/uL   RBC 4.56  4.22 - 5.81 MIL/uL   Hemoglobin 13.4  13.0 - 17.0 g/dL   HCT 16.1 (*) 09.6 - 04.5 %   MCV 82.5  78.0 - 100.0 fL   MCH 29.4  26.0 - 34.0 pg   MCHC 35.6  30.0 - 36.0 g/dL   RDW 40.9  81.1 - 91.4 %   Platelets 211  150 - 400 K/uL  COMPREHENSIVE METABOLIC PANEL     Status: Abnormal   Collection Time    06/03/12 11:22 AM      Result Value Range   Sodium 140  135 - 145 mEq/L   Potassium 3.9  3.5 - 5.1 mEq/L   Chloride 104  96 - 112 mEq/L   CO2 28  19 - 32 mEq/L   Glucose, Bld 111 (*) 70 - 99 mg/dL   BUN 16  6 - 23 mg/dL   Creatinine, Ser 7.82  0.50 - 1.35 mg/dL   Calcium 9.3  8.4 - 95.6 mg/dL   Total Protein 7.0  6.0 - 8.3 g/dL   Albumin 3.7  3.5 - 5.2 g/dL   AST 30  0 - 37 U/L   ALT 57 (*) 0 - 53 U/L   Alkaline Phosphatase 90  39 - 117 U/L   Total Bilirubin 0.6  0.3 - 1.2  mg/dL   GFR calc non Af Amer 87 (*) >90 mL/min   GFR calc Af Amer >90  >90 mL/min  D-DIMER, QUANTITATIVE     Status: None   Collection Time    06/03/12 11:22 AM      Result Value Range   D-Dimer, Quant 0.27  0.00 - 0.48 ug/mL-FEU  POCT I-STAT TROPONIN I     Status: None   Collection Time    06/03/12 11:42 AM      Result Value Range   Troponin i, poc 0.00  0.00 - 0.08 ng/mL   Comment 3           URINALYSIS, ROUTINE W REFLEX MICROSCOPIC     Status: Abnormal  Collection Time    06/03/12 12:09 PM      Result Value Range   Color, Urine YELLOW  YELLOW   APPearance CLEAR  CLEAR   Specific Gravity, Urine 1.033 (*) 1.005 - 1.030   pH 5.0  5.0 - 8.0   Glucose, UA NEGATIVE  NEGATIVE mg/dL   Hgb urine dipstick NEGATIVE  NEGATIVE   Bilirubin Urine NEGATIVE  NEGATIVE   Ketones, ur NEGATIVE  NEGATIVE mg/dL   Protein, ur NEGATIVE  NEGATIVE mg/dL   Urobilinogen, UA 1.0  0.0 - 1.0 mg/dL   Nitrite NEGATIVE  NEGATIVE   Leukocytes, UA NEGATIVE  NEGATIVE  PRO B NATRIURETIC PEPTIDE     Status: None   Collection Time    06/03/12  3:45 PM      Result Value Range   Pro B Natriuretic peptide (BNP) 55.8  0 - 125 pg/mL  GLUCOSE, CAPILLARY     Status: Abnormal   Collection Time    06/03/12  4:40 PM      Result Value Range   Glucose-Capillary 124 (*) 70 - 99 mg/dL  MRSA PCR SCREENING     Status: None   Collection Time    06/03/12  4:41 PM      Result Value Range   MRSA by PCR NEGATIVE  NEGATIVE  TROPONIN I     Status: None   Collection Time    06/03/12  6:25 PM      Result Value Range   Troponin I <0.30  <0.30 ng/mL  GLUCOSE, CAPILLARY     Status: Abnormal   Collection Time    06/03/12  7:49 PM      Result Value Range   Glucose-Capillary 128 (*) 70 - 99 mg/dL  TROPONIN I     Status: None   Collection Time    06/04/12 12:05 AM      Result Value Range   Troponin I <0.30  <0.30 ng/mL  BASIC METABOLIC PANEL     Status: Abnormal   Collection Time    06/04/12  4:30 AM      Result Value  Range   Sodium 139  135 - 145 mEq/L   Potassium 3.8  3.5 - 5.1 mEq/L   Chloride 101  96 - 112 mEq/L   CO2 28  19 - 32 mEq/L   Glucose, Bld 116 (*) 70 - 99 mg/dL   BUN 15  6 - 23 mg/dL   Creatinine, Ser 4.69  0.50 - 1.35 mg/dL   Calcium 9.1  8.4 - 62.9 mg/dL   GFR calc non Af Amer 79 (*) >90 mL/min   GFR calc Af Amer >90  >90 mL/min  CBC     Status: Abnormal   Collection Time    06/04/12  4:30 AM      Result Value Range   WBC 8.1  4.0 - 10.5 K/uL   RBC 4.68  4.22 - 5.81 MIL/uL   Hemoglobin 13.6  13.0 - 17.0 g/dL   HCT 52.8 (*) 41.3 - 24.4 %   MCV 82.5  78.0 - 100.0 fL   MCH 29.1  26.0 - 34.0 pg   MCHC 35.2  30.0 - 36.0 g/dL   RDW 01.0  27.2 - 53.6 %   Platelets 222  150 - 400 K/uL  PROTIME-INR     Status: None   Collection Time    06/04/12  4:30 AM      Result Value Range   Prothrombin Time 14.4  11.6 - 15.2 seconds   INR 1.14  0.00 - 1.49    Studies/Results: @RISRSLT24 @  Medications: Reviewed   @PROBHOSP @  1.  CP  Does not appear to be ischemic in origin  Trop neg.  Continued pain which appears more musculoskel on exam Will review echo.  Dcrease NTG.  Treat with IV toradol    2.  NICM  VOlume status improved.  BNP normal.  Switch to PO lasix  Patient admits to eatting too much salt  3.  HL  WIll review  INtolerant to statins with rash  Will use lopid  600 bid   4.  DM  COntinue meds.    LOS: 1 day   Dietrich Pates 06/04/2012, 11:10 AM

## 2012-06-04 NOTE — Progress Notes (Signed)
Utilization Review Completed. 06/04/2012  

## 2012-06-04 NOTE — Progress Notes (Signed)
  Echocardiogram 2D Echocardiogram has been performed.  Gerald [name] [name] 06/04/2012, 11:04 AM

## 2012-06-04 NOTE — Discharge Summary (Signed)
Patient ID: Gerald [name],  MRN: [id number], DOB/AGE: Nov 28, 1965 47 y.o.  Admit date: 06/03/2012 Discharge date: 06/04/2012  Primary Care Provider: Ruthe Mannan Primary Cardiologist: Judie Petit. Kirke Corin, MD  Discharge Diagnoses Principal Problem:   Chest pain Active Problems:   CAD (coronary artery disease)   HTN (hypertension)   Diabetes mellitus, new onset   HLD (hyperlipidemia)  Allergies Allergies  Allergen Reactions  . Statins     Rash    Procedures  2D Echocardiogram 06/04/2012  Study Conclusions  Left ventricle: The cavity size was normal. Wall thickness was increased in a pattern of mild LVH. Systolic function was normal. The estimated ejection fraction was in the range of 55% to 60%.      _____________  History of Present Illness  47 y/o male with h/o nonobstructive CAD and NICM, with EF previously documented in the 35-40% range.  He was in his USOH until the day of admission, when he had sudden onset of chest pain and pressure, radiating to his left shoulder and arm, associated with dyspnea, nausea, diaphoresis,a nd confusion.  When symptoms did not abate despite 4 baby aspirins, he called 911.  Upon EMS arrival, he was treated with sublingual nitroglycerin with partial relief of chest discomfort.  He was taken to [facility] ED where ECG was non-acute and initial cardiac markers were negative.  He was felt to have mild volume overload on exam and was admitted for further evaluation.  Hospital Course  Pt ruled out for MI.  He continued to have intermittent left sided chest pain and stabbing.  This was worse with palpation.  A 2d echocardiogram was performed this AM, which revealed normal LV function without wall motion abnormalities.  His proBNP returned normal and IV lasix was converted to low-dose oral lasix.  In the absence of objective evidence of ischemia, he will be discharged home today in good condition without plan for further ischemic work-up.  Discharge Vitals Blood  pressure 137/52, pulse 73, temperature 97.6 F (36.4 C), temperature source Oral, resp. rate 0, height 6\' 3"  (1.905 m), weight 324 lb 4.8 oz (147.1 kg), SpO2 98.00%.  Filed Weights   06/03/12 1756  Weight: 324 lb 4.8 oz (147.1 kg)   Labs  CBC  Recent Labs  06/03/12 1122 06/04/12 0430  WBC 7.0 8.1  HGB 13.4 13.6  HCT 37.6* 38.6*  MCV 82.5 82.5  PLT 211 222   Basic Metabolic Panel  Recent Labs  06/03/12 1122 06/04/12 0430  NA 140 139  K 3.9 3.8  CL 104 101  CO2 28 28  GLUCOSE 111* 116*  BUN 16 15  CREATININE 1.02 1.11  CALCIUM 9.3 9.1   Liver Function Tests  Recent Labs  06/03/12 1122  AST 30  ALT 57*  ALKPHOS 90  BILITOT 0.6  PROT 7.0  ALBUMIN 3.7   Cardiac Enzymes  Recent Labs  06/03/12 1825 06/04/12 0005  TROPONINI <0.30 <0.30   D-Dimer  Recent Labs  06/03/12 1122  DDIMER 0.27   Disposition  Pt is being discharged home today in good condition.  Follow-up Plans & Appointments  Follow-up Information   Follow up with Lorine Bears, MD. (2 wks - call for appt.)    Contact information:   [address] [place] [address] [phone]       Follow up with Ruthe Mannan, MD. (as scheduled)    Contact information:   [address] [place] [place], [place] [address] [phone]  Discharge Medications    Medication List    TAKE these medications       aspirin 81 MG EC tablet  Take 81 mg by mouth daily. Swallow whole.     furosemide 20 MG tablet  Commonly known as:  LASIX  Take 1 tablet (20 mg total) by mouth daily.  Start taking on:  06/05/2012     gemfibrozil 600 MG tablet  Commonly known as:  LOPID  Take 1 tablet (600 mg total) by mouth 2 (two) times daily before a meal.     glucose blood test strip  Use as instructed     lisinopril 5 MG tablet  Commonly known as:  PRINIVIL,ZESTRIL  Take 1 tablet (5 mg total) by mouth daily.     metFORMIN 500 MG tablet  Commonly known as:   GLUCOPHAGE  Take 1 tablet (500 mg total) by mouth 2 (two) times daily with a meal.     metoprolol succinate 25 MG 24 hr tablet  Commonly known as:  TOPROL-XL  Take 1 tablet (25 mg total) by mouth daily.     nitroGLYCERIN 0.4 MG SL tablet  Commonly known as:  NITROSTAT  Place 1 tablet (0.4 mg total) under the tongue every 5 (five) minutes as needed for chest pain.     ONE TOUCH ULTRA SYSTEM KIT W/DEVICE Kit  1 kit by Does not apply route once.     potassium chloride SA 20 MEQ tablet  Commonly known as:  K-DUR,KLOR-CON  Take 1 tablet (20 mEq total) by mouth daily.      Outstanding Labs/Studies  F/u bmet @ f/u appt.  Duration of Discharge Encounter   Greater than 30 minutes including physician time.  Signed, Nicolasa Ducking NP 06/04/2012, 5:24 PM

## 2012-06-04 NOTE — Progress Notes (Signed)
Pt educated on new medications, teach back method used. Pt verbalized and taught back all information. IV removed. Pt and family walked out to short stay parking. Pt was accompanied by his family.

## 2012-06-30 ENCOUNTER — Telehealth: Payer: Self-pay | Admitting: *Deleted

## 2012-06-30 NOTE — Telephone Encounter (Signed)
Form for DMV is on your desk, lower part has been completed.

## 2012-07-01 NOTE — Telephone Encounter (Signed)
Left message asking patient to call back

## 2012-07-01 NOTE — Telephone Encounter (Signed)
Advised patient form is ready for pick up, copy sent for scanning.

## 2012-07-01 NOTE — Telephone Encounter (Signed)
Form signed and in my box. 

## 2012-07-16 ENCOUNTER — Ambulatory Visit: Payer: 59 | Admitting: Family Medicine

## 2012-07-19 ENCOUNTER — Ambulatory Visit (INDEPENDENT_AMBULATORY_CARE_PROVIDER_SITE_OTHER)
Admission: RE | Admit: 2012-07-19 | Discharge: 2012-07-19 | Disposition: A | Discharge status: Home / Self Care | Payer: 59 | Source: Ambulatory Visit | Attending: Family Medicine | Admitting: Family Medicine

## 2012-07-19 ENCOUNTER — Encounter: Payer: Self-pay | Admitting: Family Medicine

## 2012-07-19 ENCOUNTER — Ambulatory Visit (INDEPENDENT_AMBULATORY_CARE_PROVIDER_SITE_OTHER): Payer: 59 | Admitting: Family Medicine

## 2012-07-19 VITALS — BP 120/80 | HR 80 | Temp 97.9°F | Wt 319.0 lb

## 2012-07-19 DIAGNOSIS — M25569 Pain in unspecified knee: Secondary | ICD-10-CM

## 2012-07-19 DIAGNOSIS — I10 Essential (primary) hypertension: Secondary | ICD-10-CM

## 2012-07-19 DIAGNOSIS — R079 Chest pain, unspecified: Secondary | ICD-10-CM

## 2012-07-19 DIAGNOSIS — M25561 Pain in right knee: Secondary | ICD-10-CM

## 2012-07-19 DIAGNOSIS — E785 Hyperlipidemia, unspecified: Secondary | ICD-10-CM

## 2012-07-19 DIAGNOSIS — E119 Type 2 diabetes mellitus without complications: Secondary | ICD-10-CM

## 2012-07-19 MED ORDER — POTASSIUM CHLORIDE CRYS ER 20 MEQ PO TBCR: 20.0000 meq | EXTENDED_RELEASE_TABLET | Freq: Every day | ORAL | Status: DC

## 2012-07-19 MED ORDER — GEMFIBROZIL 600 MG PO TABS: 600.0000 mg | ORAL_TABLET | Freq: Two times a day (BID) | ORAL | Status: DC

## 2012-07-19 MED ORDER — FUROSEMIDE 20 MG PO TABS: 20.0000 mg | ORAL_TABLET | Freq: Every day | ORAL | Status: DC

## 2012-07-19 MED ORDER — LISINOPRIL 5 MG PO TABS: 5.0000 mg | ORAL_TABLET | Freq: Every day | ORAL | Status: DC

## 2012-07-19 NOTE — Patient Instructions (Addendum)
Good to see you. We will call you with your xray and labs results.   

## 2012-07-19 NOTE — Progress Notes (Signed)
Subjective:    Patient ID: Gerald [name], male    DOB: 02-12-1965, 47 y.o.   MRN: [id number]  HPI 47 yo male with h/o DM, CAD and HTN, here for follow up.  Was admitted to [facility] last month for CP.  2 decho, EKG, CE neg.  He was d/c'd home and advised to follow up with Dr. Kirke Corin for further ischemic work up.  He has not yet seen him. Has had no further CP. Has not used his NTG.  HTN- BP improved since we started metoprolol 25 mg daily, almost at goal for diabetic. Denies any HA or blurred vision and states that he feels his BP is better controlled. On ACEI.    DM- now onset.  a1c was 6.8 at diagnosis in March, improved to 6.6.  Lab Results  Component Value Date   HGBA1C 6.6* 05/20/2012     Denies any increased thirst or urination. On Metformin 500 mg twice daily. Has not been checking his CBGs.  Per pt, FSBS have been great- was 108 this am. Denies episodes of hypoglycemia.  Right knee pain- ongoing issue for past several years that is progressing.  Really feels he is losing mobility.  Patient Active Problem List   Diagnosis Date Noted  . Chest pain 06/03/2012  . Nonischemic cardiomyopathy   . Routine general medical examination at a health care facility 05/28/2012  . HLD (hyperlipidemia) 05/28/2012  . Diabetes mellitus, new onset 04/15/2012  . CAD (coronary artery disease) 03/30/2012  . Chronic pain syndrome 03/30/2012  . HTN (hypertension) 03/30/2012   Past Medical History  Diagnosis Date  . Myocardial infarction 09/2011  . Hypertension   . Hyperlipidemia   . Obesity   . Diabetes mellitus without complication     borderline  . Sleep apnea   . Coronary artery disease     Mild nonobstructive coronary artery disease on previous cardiac catheterization most recently in August of 2013  . Nonischemic cardiomyopathy     Previous ejection fraction of 35-40% on echo. 50% on cardiac catheterization in August of 2013   Past Surgical History  Procedure Laterality Date  .  Colonoscopy    . Cardiac catheterization N/A 09/2011    [facility]; EF 50% with 30% mid LAD stenosis and no obstructive disease.  . Cardiac catheterization  09/2010    [facility]; Mid LAD 40% stenosis; Mid Circumflex:Normal; Mid RCA; Normal   History  Substance Use Topics  . Smoking status: Never Smoker   . Smokeless tobacco: Not on file  . Alcohol Use: Yes     Comment: rare   Family History  Problem Relation Age of Onset  . Heart disease Father 53    MI  . Heart attack Father   . Hypertension Mother    Allergies  Allergen Reactions  . Statins     Rash    Current Outpatient Prescriptions on File Prior to Visit  Medication Sig Dispense Refill  . aspirin 81 MG EC tablet Take 81 mg by mouth daily. Swallow whole.      . Blood Glucose Monitoring Suppl (ONE TOUCH ULTRA SYSTEM KIT) W/DEVICE KIT 1 kit by Does not apply route once.  1 each  0  . glucose blood test strip Use as instructed  100 each  12  . metFORMIN (GLUCOPHAGE) 500 MG tablet Take 1 tablet (500 mg total) by mouth 2 (two) times daily with a meal.  180 tablet  3  . metoprolol succinate (TOPROL-XL) 25 MG 24 hr tablet Take  1 tablet (25 mg total) by mouth daily.  90 tablet  3  . nitroGLYCERIN (NITROSTAT) 0.4 MG SL tablet Place 1 tablet (0.4 mg total) under the tongue every 5 (five) minutes as needed for chest pain.  25 tablet  3   No current facility-administered medications on file prior to visit.   The PMH, PSH, Social History, Family History, Medications, and allergies have been reviewed in [facility], and have been updated if relevant.      Review of Systems See HPI      Objective:   Physical Exam  BP 120/80  Pulse 80  Temp(Src) 97.9 F (36.6 C)  Wt 319 lb (144.697 kg)  BMI 39.87 kg/m2  General:  overweght male in NAD Eyes:  PERRL Ears:  External ear exam shows no significant lesions or deformities.  Otoscopic examination reveals clear canals, tympanic membranes are intact bilaterally without bulging, retraction,  inflammation or discharge. Hearing is grossly normal bilaterally. Nose:  External nasal examination shows no deformity or inflammation. Nasal mucosa are pink and moist without lesions or exudates. Mouth:  Oral mucosa and oropharynx without lesions or exudates.  Teeth in good repair. Neck:  no carotid bruit or thyromegaly no cervical or supraclavicular lymphadenopathy  Lungs:  Normal respiratory effort, chest expands symmetrically. Lungs are clear to auscultation, no crackles or wheezes. Heart:  Normal rate and regular rhythm. S1 and S2 normal without gallop, murmur, click, rub or other extra sounds. Abdomen:  Bowel sounds positive,abdomen soft and non-tender without masses, organomegaly or hernias noted. Pulses:  R and L posterior tibial pulses are full and equal bilaterally  Extremities:  no edema  Right knee- no effusion, +crepitus, no laxity, neg anterior drawer    Assessment & Plan:   1. Chest pain Resolved.  He states that he was given lasix.  I do not see indication of volume overload and 2 d echo was normal. Will check BMET and BNP today. - Brain natriuretic peptide  2. HTN (hypertension) Stable on current meds. - Basic Metabolic Panel  3. Diabetes mellitus, new onset Good control.  Not yet due for follow up a1c.  4. Right knee pain Probable DJD.  Will order xray today. - DG Knee Complete 4 Views Right; Future

## 2012-07-20 LAB — BASIC METABOLIC PANEL
Calcium: 9.6 mg/dL (ref 8.4–10.5)
Creatinine, Ser: 1.2 mg/dL (ref 0.4–1.5)
GFR: 72.13 mL/min (ref >60.00)
Sodium: 139 mEq/L (ref 135–145)

## 2012-08-12 ENCOUNTER — Ambulatory Visit (INDEPENDENT_AMBULATORY_CARE_PROVIDER_SITE_OTHER): Payer: 59 | Admitting: Family Medicine

## 2012-08-12 ENCOUNTER — Encounter: Payer: Self-pay | Admitting: Family Medicine

## 2012-08-12 VITALS — BP 130/82 | HR 66 | Temp 97.8°F | Ht 75.0 in | Wt 314.5 lb

## 2012-08-12 DIAGNOSIS — M25561 Pain in right knee: Secondary | ICD-10-CM

## 2012-08-12 DIAGNOSIS — M2391 Unspecified internal derangement of right knee: Secondary | ICD-10-CM

## 2012-08-12 DIAGNOSIS — M2390 Unspecified internal derangement of unspecified knee: Secondary | ICD-10-CM

## 2012-08-12 DIAGNOSIS — M25569 Pain in unspecified knee: Secondary | ICD-10-CM

## 2012-08-12 NOTE — Progress Notes (Signed)
Nature conservation officer at [facility] [address] [place] [address] Phone: [phone] Fax: [phone]  Date:  08/12/2012   Name:  Gerald [name]   DOB:  [birth year]   MRN:  [id number] Gender: male Age: 47 y.o.  Primary Physician:  Ruthe Mannan, MD  Evaluating MD: Hannah Beat, MD   Chief Complaint: Knee Pain   History of Present Illness:  Gerald [name] is a 47 y.o. pleasant patient who presents with the following:  Pleasant gentleman 2-3 year history of deep pain on the right side, more on the anteromedial aspect. He does have some intermittent catching, where he will have severe pain and feel as if he is going to fall. He denies any specific trauma. He has done physical therapy in the past. He is also done multiple anti-inflammatories, Tylenol, and pain medications. He also had one intra-articular knee injection in the past from orthopedics which helped for approximately one week. He is currently fairly limited and has pain during his 12 hour work day and is limited in his ability to get physical exercise. The patient does weigh 314 pounds.  Constant popping. Can be walking down the dock andd it will be giving way. Right medially. Feels like a knife. The last couple of weeks, little toe and has had some unbearable pain. Now cannot run like he used to. Sounds like a popping machine with movement. Went to PT for knee. Saw Lendon Colonel before from [facility].    Patient Active Problem List   Diagnosis Date Noted  . Chest pain 06/03/2012  . Nonischemic cardiomyopathy   . Routine general medical examination at a health care facility 05/28/2012  . HLD (hyperlipidemia) 05/28/2012  . Diabetes mellitus, new onset 04/15/2012  . CAD (coronary artery disease) 03/30/2012  . Chronic pain syndrome 03/30/2012  . HTN (hypertension) 03/30/2012    Past Medical History  Diagnosis Date  . Myocardial infarction 09/2011  . Hypertension   . Hyperlipidemia   . Obesity   .  Diabetes mellitus without complication     borderline  . Sleep apnea   . Coronary artery disease     Mild nonobstructive coronary artery disease on previous cardiac catheterization most recently in August of 2013  . Nonischemic cardiomyopathy     Previous ejection fraction of 35-40% on echo. 50% on cardiac catheterization in August of 2013    Past Surgical History  Procedure Laterality Date  . Colonoscopy    . Cardiac catheterization N/A 09/2011    [facility]; EF 50% with 30% mid LAD stenosis and no obstructive disease.  . Cardiac catheterization  09/2010    [facility]; Mid LAD 40% stenosis; Mid Circumflex:Normal; Mid RCA; Normal    History   Social History  . Marital Status: Single    Spouse Name: N/A    Number of Children: N/A  . Years of Education: N/A   Occupational History  . Not on file.   Social History Main Topics  . Smoking status: Never Smoker   . Smokeless tobacco: Not on file  . Alcohol Use: Yes     Comment: rare  . Drug Use: No  . Sexually Active: Yes   Other Topics Concern  . Not on file   Social History Narrative   Married.  32 yo son.   Wife on disability for bipolar disorder.      Family History  Problem Relation Age of Onset  . Heart disease Father 70    MI  . Heart attack  Father   . Hypertension Mother     Allergies  Allergen Reactions  . Statins     Rash     Medication list has been reviewed and updated.  Outpatient Prescriptions Prior to Visit  Medication Sig Dispense Refill  . aspirin 81 MG EC tablet Take 81 mg by mouth daily. Swallow whole.      . Blood Glucose Monitoring Suppl (ONE TOUCH ULTRA SYSTEM KIT) W/DEVICE KIT 1 kit by Does not apply route once.  1 each  0  . furosemide (LASIX) 20 MG tablet Take 1 tablet (20 mg total) by mouth daily.  90 tablet  1  . gemfibrozil (LOPID) 600 MG tablet Take 1 tablet (600 mg total) by mouth 2 (two) times daily before a meal.  180 tablet  1  . glucose blood test strip Use as instructed  100 each  12    . lisinopril (PRINIVIL,ZESTRIL) 5 MG tablet Take 1 tablet (5 mg total) by mouth daily.  90 tablet  1  . metFORMIN (GLUCOPHAGE) 500 MG tablet Take 1 tablet (500 mg total) by mouth 2 (two) times daily with a meal.  180 tablet  3  . metoprolol succinate (TOPROL-XL) 25 MG 24 hr tablet Take 1 tablet (25 mg total) by mouth daily.  90 tablet  3  . nitroGLYCERIN (NITROSTAT) 0.4 MG SL tablet Place 1 tablet (0.4 mg total) under the tongue every 5 (five) minutes as needed for chest pain.  25 tablet  3  . potassium chloride SA (K-DUR,KLOR-CON) 20 MEQ tablet Take 1 tablet (20 mEq total) by mouth daily.  90 tablet  1   No facility-administered medications prior to visit.    Review of Systems:   GEN: No fevers, chills. Nontoxic. Primarily MSK c/o today. MSK: Detailed in the HPI GI: tolerating PO intake without difficulty Neuro: No numbness, parasthesias, or tingling associated. Otherwise the pertinent positives of the ROS are noted above.    Physical Examination: BP 130/82  Pulse 66  Temp(Src) 97.8 F (36.6 C) (Oral)  Ht 6\' 3"  (1.905 m)  Wt 314 lb 8 oz (142.656 kg)  BMI 39.31 kg/m2  SpO2 96%  Ideal Body Weight: Weight in (lb) to have BMI = 25: 199.6   GEN: WDWN, NAD, Non-toxic, Alert & Oriented x 3 HEENT: Atraumatic, Normocephalic.  Ears and Nose: No external deformity. EXTR: No clubbing/cyanosis/edema NEURO: Normal gait.  PSYCH: Normally interactive. Conversant. Not depressed or anxious appearing.  Calm demeanor.   Knee:  R Gait: Normal heel toe pattern ROM: 0-115 Effusion: minimal Echymosis or edema: none Patellar tendon NT Painful PLICA: neg Patellar grind: pos Medial and lateral patellar facet loading: pos medial and lateral joint lines:medial joint line pain Mcmurray's pos for pain Flexion-pinch pos Bounce home pos Varus and valgus stress: stable Lachman: neg Ant and Post drawer: neg Hip abduction, IR, ER: WNL Hip flexion str: 5/5 Hip abd: 5/5 Quad: 5/5 VMO  atrophy:No Hamstring concentric and eccentric: 5/5   Dg Knee Complete 4 Views Right  07/19/2012   *RADIOLOGY REPORT*  Clinical Data: Right knee pain  RIGHT KNEE - COMPLETE 4+ VIEW  Comparison: None.  Findings: No fracture or dislocation is noted. Mild narrowing of medial joint space is noted.  Minimal spurring of tibial spines is noted.  No joint effusion is noted.  Spurring of superior aspect of patella is noted.  IMPRESSION: Mild degenerative joint disease.  No fracture or dislocation seen in right knee.   Original Report Authenticated By: Roque Lias  Montez Hageman.,  M.D.   Independent review: There is evidence of some mild degenerative changes, most notable in the medial compartment as well as in the patellofemoral compartment, and there is a notable superior osteophyte on the patella.  Hannah Beat, MD 08/12/2012, 8:50 AM   Assessment and Plan:  Internal derangement of right knee - Plan: MR Knee Right Wo Contrast  Right knee pain - Plan: MR Knee Right Wo Contrast  3 year history of right-sided knee pain with mechanical symptoms and symptomatic giving way. Failure of conservative management. Failure of multiple anti-inflammatories, pain medications, and intra-articular steroids. Failure of physical therapy. Obtain an MRI of the right knee to evaluate for potential internal derangement and meniscal pathology was concerned given a positive McMurray's test for pain, positive bounce home test, and positive flexion pinch test, as well as a positive pain sign on his medial joint line.  Orders Today:  Orders Placed This Encounter  Procedures  . MR Knee Right Wo Contrast    Standing Status: Future     Number of Occurrences:      Standing Expiration Date: 10/12/2013    Order Specific Question:  Does the patient have a pacemaker, internal devices, implants, aneury    Answer:  No    Order Specific Question:  Preferred imaging location?    Answer:  [id number]    Order Specific Question:  Reason for  exam:    Answer:  concern for meniscal tear, R knee    Updated Medication List: (Includes new medications, updates to list, dose adjustments) No orders of the defined types were placed in this encounter.    Medications Discontinued: There are no discontinued medications.    Signed, Elpidio Galea. [name], MD 08/12/2012 8:06 AM

## 2012-08-12 NOTE — Patient Instructions (Addendum)
REFERRAL: GO THE THE FRONT ROOM AT THE ENTRANCE OF OUR CLINIC, NEAR CHECK IN. ASK FOR MARION. SHE WILL HELP YOU SET UP YOUR REFERRAL. DATE: TIME:  

## 2012-08-14 ENCOUNTER — Ambulatory Visit
Admission: RE | Admit: 2012-08-14 | Discharge: 2012-08-14 | Disposition: A | Discharge status: Home / Self Care | Payer: 59 | Source: Ambulatory Visit | Attending: Family Medicine | Admitting: Family Medicine

## 2012-08-14 DIAGNOSIS — M2391 Unspecified internal derangement of right knee: Secondary | ICD-10-CM

## 2012-08-14 DIAGNOSIS — M25561 Pain in right knee: Secondary | ICD-10-CM

## 2012-08-18 ENCOUNTER — Telehealth: Payer: Self-pay

## 2012-08-18 NOTE — Telephone Encounter (Signed)
LMOM

## 2012-08-18 NOTE — Telephone Encounter (Signed)
Pt left v/m requesting cb at [phone] with MRI results.

## 2012-08-20 ENCOUNTER — Telehealth: Payer: Self-pay | Admitting: Family Medicine

## 2012-08-20 ENCOUNTER — Other Ambulatory Visit: Payer: Self-pay | Admitting: Family Medicine

## 2012-08-20 DIAGNOSIS — M25569 Pain in unspecified knee: Secondary | ICD-10-CM

## 2012-08-20 NOTE — Telephone Encounter (Addendum)
Pt scheduled for surgical consult with Dr. Richardson Landry Tuesday 08/24/12 at 10am.  Pt needs to pick up CD of XRAYs from our office prior to appt per Ricki Miller.  MRI results need to be discussed before appt info is given.

## 2012-08-20 NOTE — Telephone Encounter (Signed)
Patient given results in other phone note

## 2012-08-23 NOTE — Telephone Encounter (Signed)
Error

## 2012-08-23 NOTE — Telephone Encounter (Signed)
Gerald [name] can pull up our Xrays and MRI in his office. No CD is needed.

## 2012-09-10 ENCOUNTER — Encounter (HOSPITAL_BASED_OUTPATIENT_CLINIC_OR_DEPARTMENT_OTHER): Payer: Self-pay | Admitting: *Deleted

## 2012-09-10 NOTE — Progress Notes (Signed)
Pt has had 3 cardiac caths-last 8/13-last er for chest pains 5/14-work up negative-echo better at 50-60EF-has been down to 40% Pt denies any stents yet hx mentions stents- To come in for bmet-ekg done 5/14-has sleep apnea-cannot afford cpap-to bring all meds and overnight bag-may need to stay-reviewed bnotes with dr crews-oked him for dsc

## 2012-09-14 ENCOUNTER — Ambulatory Visit (INDEPENDENT_AMBULATORY_CARE_PROVIDER_SITE_OTHER): Payer: 59 | Admitting: Physician Assistant

## 2012-09-14 ENCOUNTER — Encounter: Payer: Self-pay | Admitting: Physician Assistant

## 2012-09-14 VITALS — BP 130/90 | HR 66 | Ht 75.0 in | Wt 314.5 lb

## 2012-09-14 DIAGNOSIS — I428 Other cardiomyopathies: Secondary | ICD-10-CM

## 2012-09-14 DIAGNOSIS — I2510 Atherosclerotic heart disease of native coronary artery without angina pectoris: Secondary | ICD-10-CM

## 2012-09-14 DIAGNOSIS — I10 Essential (primary) hypertension: Secondary | ICD-10-CM

## 2012-09-14 DIAGNOSIS — Z01810 Encounter for preprocedural cardiovascular examination: Secondary | ICD-10-CM

## 2012-09-14 DIAGNOSIS — E119 Type 2 diabetes mellitus without complications: Secondary | ICD-10-CM

## 2012-09-14 DIAGNOSIS — E785 Hyperlipidemia, unspecified: Secondary | ICD-10-CM

## 2012-09-14 NOTE — Patient Instructions (Addendum)
Please resume aspirin as soon as possible after your surgery. Continue to hold this leading up to the surgery.   Continue to take lisinopril and metoprolol succinate.   You have been cleared for surgery from a cardiac standpoint. This will be faxed to the orthopedic practice.   We will see you back for a regularly scheduled follow-up appointment in 1 month.

## 2012-09-14 NOTE — Progress Notes (Addendum)
Patient ID: Gerald [name], male   DOB: [birth year], 47 y.o.   MRN: [id number]            Date:  09/14/2012   ID:  Gerald [name], DOB 1965/09/06, MRN [id number]  PCP:  Ruthe Mannan, MD  Primary Cardiologist:  Judie Petit. Kirke Corin, MD   History of Present Illness:  Gerald [name] is a 47 y.o. male with PMHx s/f nonobstructive CAD, previously documented NICM (EF 35-40%), DM2, HTN, HLD (statin intolerant) and family history of premature CAD who follows up today for pre-op evaluation prior to undergoing R knee arthroscopy.   He has had two prior cardiac catheterizations performed for atypical chest pain in August of 2012 & 2013. The latter study revealed tubular 30% LAD, 40% distal LAD, 30% mid LCx, 40% mid RCA stenoses; EF 60%. He transitioned cardiac care to Dr. Kirke Corin 05/2012. He was briefly hospitalized at [facility] for atypical chest pain (sharp, reproducible on palpation) in 06/2012. He did have an episode of syncope while bending over which induced an episode of chest pain, felt to be vasovagal. He ruled out overnight. D-dimer returned WNL. 2D echo revealed mild LVH, LVEF 55-60%. He was diagnosed with new onset type 2 DM, and discharged on metformin. No further ischemic work-up indicated. He did not follow-up 2 weeks post-discharge despite this recommendation.   He has been having R knee pain which has progressively worsened for the past 2-3 months. Follow-up R knee MRI revealed medial and lateral meniscus fraying w/o tear and osteoarthritis. He is scheduled to undergo R knee arthroscopy pending cardiac pre-op evaluation.   He has felt well otherwise. He has lost 15-20 lbs by removing salt from his diet. He denies chest pain, SOB/DOE, PND, orthopnea, LE edema, palpitations, lightheadedness or syncope. He is able to climb a flight of stairs and walk on flat ground for 2-3 blocks w/o incident. He continues to take his cardiac meds. He has held ASA since Saturday as recommended by ortho.   EKG: NSR, 1st  degree AVB, 69 bpm, no ST/T changes, IVCD III, aVF  Wt Readings from Last 3 Encounters:  09/14/12 142.656 kg (314 lb 8 oz)  09/10/12 136.079 kg (300 lb)  08/12/12 142.656 kg (314 lb 8 oz)     Past Medical History  Diagnosis Date  . Myocardial infarction 09/2011  . Hypertension   . Hyperlipidemia   . Obesity   . Diabetes mellitus without complication     borderline  . Nonischemic cardiomyopathy     Previous ejection fraction of 35-40% on echo. 50% on cardiac catheterization in August of 2013  . Sleep apnea   . Coronary artery disease     Mild nonobstructive coronary artery disease on previous cardiac catheterization most recently in August of 2013    Current Outpatient Prescriptions  Medication Sig Dispense Refill  . aspirin 81 MG EC tablet Take 81 mg by mouth daily. Swallow whole.      . Blood Glucose Monitoring Suppl (ONE TOUCH ULTRA SYSTEM KIT) W/DEVICE KIT 1 kit by Does not apply route once.  1 each  0  . furosemide (LASIX) 20 MG tablet Take 1 tablet (20 mg total) by mouth daily.  90 tablet  1  . gemfibrozil (LOPID) 600 MG tablet Take 1 tablet (600 mg total) by mouth 2 (two) times daily before a meal.  180 tablet  1  . glucose blood test strip Use as instructed  100 each  12  . lisinopril (PRINIVIL,ZESTRIL) 5 MG tablet Take  1 tablet (5 mg total) by mouth daily.  90 tablet  1  . metFORMIN (GLUCOPHAGE) 500 MG tablet Take 500 mg by mouth daily with breakfast.      . metoprolol succinate (TOPROL-XL) 25 MG 24 hr tablet Take 1 tablet (25 mg total) by mouth daily.  90 tablet  3  . nitroGLYCERIN (NITROSTAT) 0.4 MG SL tablet Place 1 tablet (0.4 mg total) under the tongue every 5 (five) minutes as needed for chest pain.  25 tablet  3  . potassium chloride SA (K-DUR,KLOR-CON) 20 MEQ tablet Take 1 tablet (20 mEq total) by mouth daily.  90 tablet  1   No current facility-administered medications for this visit.    Allergies:    Allergies  Allergen Reactions  . Statins     Rash      Social History:  The patient  reports that he has never smoked. He does not have any smokeless tobacco history on file. He reports that  drinks alcohol. He reports that he does not use illicit drugs.   Family History:  Family History  Problem Relation Age of Onset  . Heart disease Father 19    MI  . Heart attack Father   . Hypertension Mother     Review of Systems: General: negative for chills, fever, night sweats or weight changes.  Cardiovascular: negative for chest pain, dyspnea on exertion, edema, orthopnea, palpitations, paroxysmal nocturnal dyspnea or shortness of breath Dermatological: negative for rash Respiratory: negative for cough or wheezing Urologic: negative for hematuria Abdominal: negative for nausea, vomiting, diarrhea, bright red blood per rectum, melena, or hematemesis Neurologic: negative for visual changes, syncope, or dizziness Musculoskeletal: positive for R knee pain All other systems reviewed and are otherwise negative except as noted above.  PHYSICAL EXAM: VS:  BP 130/90  Ht 6\' 3"  (1.905 m)  Wt 142.656 kg (314 lb 8 oz)  BMI 39.31 kg/m2  Well developed obese male, in no acute distress HEENT: normal, PERRL Neck: no JVD or bruits Cardiac:  normal S1, S2; RRR; no murmur or gallops Lungs:  clear to auscultation bilaterally, no wheezing, rhonchi or rales Abd: soft, nontender, no hepatomegaly, normoactive BS x 4 quads Ext: no edema, cyanosis or clubbing Skin: warm and dry, cap refill < 2 sec Neuro:  CNs 2-12 intact, no focal abnormalities noted Musculoskeletal: strength and tone appropriate for age  Psych: normal affect

## 2012-09-14 NOTE — Assessment & Plan Note (Signed)
Stable

## 2012-09-14 NOTE — Assessment & Plan Note (Signed)
Compensated. Preserved EF.

## 2012-09-14 NOTE — Assessment & Plan Note (Deleted)
Follow-up PCP for continued management.

## 2012-09-14 NOTE — Assessment & Plan Note (Signed)
Statin intolerant. Followed by PCP. Encouraged continued lifestyle modifications.

## 2012-09-14 NOTE — Assessment & Plan Note (Signed)
Well-controlled. Continue current antihypertensives.  

## 2012-09-14 NOTE — H&P (Signed)
  Gerald [name]/Gerald [name] [address]   [address] Gerald [name], Gerald [name] [phone] A Division of [name]  Gerald [name], M.D.   Robert A. Thurston Hole, M.D.   Burnell Blanks, M.D.   Eulas Post, M.D.   Lunette Stands, M.D Jewel Baize. Eulah Pont, M.D.  Buford Dresser, M.D.  Charlsie Quest, M.D.  Estell Harpin, M.D.   Melina Fiddler, M.D. Kirstin A. Shepperson, PA-C  Josh [place], PA-C [place], [place]   RE: Gerald [name], Gerald [name]   [id number]      DOB: [birth year] PROGRESS NOTE: 08-27-12 Gerald [name] comes in for evaluation of his right knee. Prior to being seen he has had x-rays and an MRI scan. I have reviewed the reports and films themselves. He has been seen previously in our office 2013 for right knee pain mechanical symptoms and aching. X-rays at that time showed moderate changes medial compartment and patellofemoral joint. He had a cortisone injection with transient relief.  Films ordered by Gerald [name] and Gerald [name].  Non-weightbearing films of the knee showed no fracture and minimal change. MRI of his right knee showed tearing midportion both medial and lateral meniscus more degenerative fraying than large displaced fragments. Degenerative change medial compartment with some subchondral edema. Ligaments intact. Symptoms are mechanical in nature medial aspect right knee but there is some component of global aching and soreness.  Remaining history general exam is outlined included in the chart. This is significant for cardiac disease with a cardiac cath done last year. I don't have those results but no current cardiac symptoms.   EXAMINATION: 47 years  old. His weight is down to 320 from 350. Antalgic gait on the right a little bit of a varus thrust on the right. Negative log roll both hips negative straight leg raise both sides. Left knee has reasonable alignment a little sore medially negative McMurray's stable ligaments good motion.  Right knee a little more varus very tender medial joint line popping with McMurray's medial greater than lateral. Fairly good motion trace effusion stable ligaments. He is neurovascularly intact distally.  X-RAYS: Repeat standing 4 view x-rays shows 50%% narrowing medial compartment on the right. Some changes patellofemoral joint certainly not bone on bone.   DISPOSITION: I reviewed all workup and treatment to date with Gerald [name]. I showed him the weightbearing x-rays as well. We discussed options. Sufficient longevity and magnitude of symptoms to do something. We discussed exam under anesthesia arthroscopy chondroplasty meniscectomy. Outcome will be dependent on degree of degenerative changes present. I reinforced strongly that I will find a moderate amount of arthritis and at some point he could progress to total knee replacement. I do feel after review of everything that it is worth trying debriding arthroscopy. He understands and agrees. All questions answered. Discussed risks benefits and possible complications in detail. We will need pre-op cardiac clearance.  Gerald [name], M.D.  Electronically verified by Gerald [name], M.D. DFM:kah D 08-30-12 T [id number]

## 2012-09-14 NOTE — Assessment & Plan Note (Signed)
Continue follow-up/management with PCP.

## 2012-09-14 NOTE — Assessment & Plan Note (Signed)
This is a 47 yo male with a history of nonobstructive CAD, DM2, h/o NICM/chronic systolic CHF, HTN, HLD and obesity. He denies exertional chest pain, SOB/DOE, PND, orthopnea, LE edema, palpitations, lightheadedness or syncope. He has actually lost weight. He is able to complete > 4 METs at baseline. He is scheduled to undergo a R arhroscopic knee procedure 8/14. CAD is stable. No anginal symptoms. EKG NSR w/o evidence of ischemia today. The patient has a preserved EF and denies CHF-type symptoms. He is at an acceptable cardiac risk. No further work-up warranted at this time. Advised to resume low-dose aspirin post-op as soon as deemed safe from a orthopedic surgery standpoint. He will continue metoprolol in the peri-op period.

## 2012-09-15 ENCOUNTER — Telehealth: Payer: Self-pay | Admitting: *Deleted

## 2012-09-15 ENCOUNTER — Encounter (HOSPITAL_BASED_OUTPATIENT_CLINIC_OR_DEPARTMENT_OTHER)
Admission: RE | Admit: 2012-09-15 | Discharge: 2012-09-15 | Disposition: A | Discharge status: Home / Self Care | Payer: 59 | Source: Ambulatory Visit | Attending: Orthopedic Surgery | Admitting: Orthopedic Surgery

## 2012-09-15 ENCOUNTER — Other Ambulatory Visit: Payer: Self-pay | Admitting: Orthopedic Surgery

## 2012-09-15 NOTE — Telephone Encounter (Signed)
Patient needs a one month f/u appt

## 2012-09-16 ENCOUNTER — Encounter (HOSPITAL_BASED_OUTPATIENT_CLINIC_OR_DEPARTMENT_OTHER)
Admission: RE | Disposition: A | Discharge status: Home / Self Care | Payer: Self-pay | Source: Ambulatory Visit | Attending: Orthopedic Surgery

## 2012-09-16 ENCOUNTER — Encounter (HOSPITAL_BASED_OUTPATIENT_CLINIC_OR_DEPARTMENT_OTHER): Payer: Self-pay | Admitting: Anesthesiology

## 2012-09-16 ENCOUNTER — Ambulatory Visit (HOSPITAL_BASED_OUTPATIENT_CLINIC_OR_DEPARTMENT_OTHER)
Admission: RE | Admit: 2012-09-16 | Discharge: 2012-09-17 | Disposition: A | Discharge status: Home / Self Care | Payer: 59 | Source: Ambulatory Visit | Attending: Orthopedic Surgery | Admitting: Orthopedic Surgery

## 2012-09-16 ENCOUNTER — Ambulatory Visit (HOSPITAL_BASED_OUTPATIENT_CLINIC_OR_DEPARTMENT_OTHER): Payer: 59 | Admitting: Anesthesiology

## 2012-09-16 ENCOUNTER — Encounter (HOSPITAL_BASED_OUTPATIENT_CLINIC_OR_DEPARTMENT_OTHER): Payer: Self-pay | Admitting: *Deleted

## 2012-09-16 DIAGNOSIS — M2340 Loose body in knee, unspecified knee: Secondary | ICD-10-CM | POA: Insufficient documentation

## 2012-09-16 DIAGNOSIS — M23305 Other meniscus derangements, unspecified medial meniscus, unspecified knee: Secondary | ICD-10-CM | POA: Insufficient documentation

## 2012-09-16 DIAGNOSIS — M23302 Other meniscus derangements, unspecified lateral meniscus, unspecified knee: Secondary | ICD-10-CM | POA: Insufficient documentation

## 2012-09-16 DIAGNOSIS — I252 Old myocardial infarction: Secondary | ICD-10-CM | POA: Insufficient documentation

## 2012-09-16 DIAGNOSIS — Z6839 Body mass index (BMI) 39.0-39.9, adult: Secondary | ICD-10-CM | POA: Insufficient documentation

## 2012-09-16 DIAGNOSIS — I10 Essential (primary) hypertension: Secondary | ICD-10-CM | POA: Insufficient documentation

## 2012-09-16 DIAGNOSIS — M6580 Other synovitis and tenosynovitis, unspecified site: Secondary | ICD-10-CM | POA: Insufficient documentation

## 2012-09-16 DIAGNOSIS — M2240 Chondromalacia patellae, unspecified knee: Secondary | ICD-10-CM | POA: Insufficient documentation

## 2012-09-16 DIAGNOSIS — I2510 Atherosclerotic heart disease of native coronary artery without angina pectoris: Secondary | ICD-10-CM | POA: Insufficient documentation

## 2012-09-16 DIAGNOSIS — E119 Type 2 diabetes mellitus without complications: Secondary | ICD-10-CM | POA: Insufficient documentation

## 2012-09-16 HISTORY — PX: KNEE ARTHROSCOPY WITH MEDIAL MENISECTOMY: SHX5651

## 2012-09-16 LAB — POCT I-STAT, CHEM 8
BUN: 17 mg/dL (ref 6–23)
Calcium, Ion: 1.11 mmol/L — ABNORMAL LOW (ref 1.12–1.23)
Chloride: 102 mEq/L (ref 96–112)
Chloride: 105 mEq/L (ref 96–112)
Creatinine, Ser: 1.1 mg/dL (ref 0.50–1.35)
Glucose, Bld: 101 mg/dL — ABNORMAL HIGH (ref 70–99)
HCT: 44 % (ref 39.0–52.0)
HCT: 41 % (ref 39.0–52.0)
Hemoglobin: 15 g/dL (ref 13.0–17.0)
Potassium: 4 mEq/L (ref 3.5–5.1)
Potassium: 4 mEq/L (ref 3.5–5.1)
Sodium: 142 mEq/L (ref 135–145)

## 2012-09-16 LAB — GLUCOSE, CAPILLARY: Glucose-Capillary: 102 mg/dL — ABNORMAL HIGH (ref 70–99)

## 2012-09-16 SURGERY — ARTHROSCOPY, KNEE, WITH MEDIAL MENISCECTOMY
Anesthesia: General | Site: Knee | Laterality: Right | Wound class: Clean

## 2012-09-16 MED ORDER — ONDANSETRON HCL 4 MG/2ML IJ SOLN: 4.0000 mg | Freq: Once | INTRAMUSCULAR | Status: DC | PRN

## 2012-09-16 MED ORDER — DEXTROSE-NACL 5-0.45 % IV SOLN
INTRAVENOUS | Status: AC
  Administered 2012-09-16: 14:00:00 via INTRAVENOUS

## 2012-09-16 MED ORDER — OXYCODONE HCL 5 MG PO TABS
5.0000 mg | ORAL_TABLET | Freq: Once | ORAL | Status: AC | PRN
  Administered 2012-09-16: 5 mg via ORAL

## 2012-09-16 MED ORDER — CEFAZOLIN SODIUM-DEXTROSE 2-3 GM-% IV SOLR
2.0000 g | Freq: Four times a day (QID) | INTRAVENOUS | Status: AC
  Administered 2012-09-16 – 2012-09-17 (×3): 2 g via INTRAVENOUS

## 2012-09-16 MED ORDER — ONDANSETRON HCL 4 MG PO TABS: 4.0000 mg | ORAL_TABLET | Freq: Four times a day (QID) | ORAL | Status: DC | PRN

## 2012-09-16 MED ORDER — POTASSIUM CHLORIDE CRYS ER 20 MEQ PO TBCR
20.0000 meq | EXTENDED_RELEASE_TABLET | Freq: Every day | ORAL | Status: DC
  Administered 2012-09-16: 20 meq via ORAL

## 2012-09-16 MED ORDER — MIDAZOLAM HCL 2 MG/2ML IJ SOLN: 1.0000 mg | INTRAMUSCULAR | Status: DC | PRN

## 2012-09-16 MED ORDER — METOCLOPRAMIDE HCL 5 MG/ML IJ SOLN: 5.0000 mg | Freq: Three times a day (TID) | INTRAMUSCULAR | Status: DC | PRN

## 2012-09-16 MED ORDER — BUPIVACAINE HCL (PF) 0.5 % IJ SOLN
INTRAMUSCULAR | Status: DC | PRN
  Administered 2012-09-16: 15 mL

## 2012-09-16 MED ORDER — FUROSEMIDE 20 MG PO TABS
20.0000 mg | ORAL_TABLET | Freq: Every day | ORAL | Status: DC
  Administered 2012-09-16: 20 mg via ORAL

## 2012-09-16 MED ORDER — METFORMIN HCL 500 MG PO TABS
500.0000 mg | ORAL_TABLET | Freq: Every day | ORAL | Status: DC
  Administered 2012-09-16: 500 mg via ORAL

## 2012-09-16 MED ORDER — ASPIRIN 81 MG PO TBEC
81.0000 mg | DELAYED_RELEASE_TABLET | Freq: Every day | ORAL | Status: DC
  Administered 2012-09-16: 325 mg via ORAL

## 2012-09-16 MED ORDER — CEFAZOLIN SODIUM-DEXTROSE 2-3 GM-% IV SOLR
2.0000 g | INTRAVENOUS | Status: AC
  Administered 2012-09-16: 3 g via INTRAVENOUS

## 2012-09-16 MED ORDER — LIDOCAINE HCL (CARDIAC) 20 MG/ML IV SOLN
INTRAVENOUS | Status: DC | PRN
  Administered 2012-09-16: 40 mg via INTRAVENOUS

## 2012-09-16 MED ORDER — METHYLPREDNISOLONE ACETATE 80 MG/ML IJ SUSP
INTRAMUSCULAR | Status: DC | PRN
  Administered 2012-09-16: 80 mg

## 2012-09-16 MED ORDER — FENTANYL CITRATE 0.05 MG/ML IJ SOLN: 50.0000 ug | INTRAMUSCULAR | Status: DC | PRN

## 2012-09-16 MED ORDER — LISINOPRIL 5 MG PO TABS
5.0000 mg | ORAL_TABLET | Freq: Every day | ORAL | Status: DC
  Administered 2012-09-16: 5 mg via ORAL

## 2012-09-16 MED ORDER — OXYCODONE HCL 5 MG/5ML PO SOLN: 5.0000 mg | Freq: Once | ORAL | Status: AC | PRN

## 2012-09-16 MED ORDER — ZOLPIDEM TARTRATE 5 MG PO TABS: 5.0000 mg | ORAL_TABLET | Freq: Every evening | ORAL | Status: DC | PRN

## 2012-09-16 MED ORDER — LACTATED RINGERS IV SOLN
INTRAVENOUS | Status: DC
  Administered 2012-09-16: 11:00:00 via INTRAVENOUS

## 2012-09-16 MED ORDER — MORPHINE SULFATE 2 MG/ML IJ SOLN
1.0000 mg | INTRAMUSCULAR | Status: DC | PRN
  Administered 2012-09-16 – 2012-09-17 (×2): 1 mg via INTRAVENOUS

## 2012-09-16 MED ORDER — ONDANSETRON HCL 4 MG/2ML IJ SOLN: 4.0000 mg | Freq: Four times a day (QID) | INTRAMUSCULAR | Status: DC | PRN

## 2012-09-16 MED ORDER — METOCLOPRAMIDE HCL 5 MG PO TABS: 5.0000 mg | ORAL_TABLET | Freq: Three times a day (TID) | ORAL | Status: DC | PRN

## 2012-09-16 MED ORDER — PROPOFOL 10 MG/ML IV BOLUS
INTRAVENOUS | Status: DC | PRN
  Administered 2012-09-16: 200 mg via INTRAVENOUS

## 2012-09-16 MED ORDER — HYDROMORPHONE HCL PF 1 MG/ML IJ SOLN
0.2500 mg | INTRAMUSCULAR | Status: DC | PRN
  Administered 2012-09-16 (×4): 0.5 mg via INTRAVENOUS

## 2012-09-16 MED ORDER — FENTANYL CITRATE 0.05 MG/ML IJ SOLN
INTRAMUSCULAR | Status: DC | PRN
  Administered 2012-09-16: 50 ug via INTRAVENOUS
  Administered 2012-09-16: 25 ug via INTRAVENOUS
  Administered 2012-09-16: 50 ug via INTRAVENOUS

## 2012-09-16 MED ORDER — METOPROLOL SUCCINATE ER 25 MG PO TB24
25.0000 mg | ORAL_TABLET | Freq: Every day | ORAL | Status: DC
  Administered 2012-09-16: 25 mg via ORAL

## 2012-09-16 MED ORDER — GEMFIBROZIL 600 MG PO TABS
600.0000 mg | ORAL_TABLET | Freq: Two times a day (BID) | ORAL | Status: DC
  Administered 2012-09-16: 600 mg via ORAL

## 2012-09-16 MED ORDER — CHLORHEXIDINE GLUCONATE 4 % EX LIQD: 60.0000 mL | Freq: Once | CUTANEOUS | Status: DC

## 2012-09-16 MED ORDER — NITROGLYCERIN 0.4 MG SL SUBL: 0.4000 mg | SUBLINGUAL_TABLET | SUBLINGUAL | Status: DC | PRN

## 2012-09-16 MED ORDER — MIDAZOLAM HCL 5 MG/5ML IJ SOLN
INTRAMUSCULAR | Status: DC | PRN
  Administered 2012-09-16: 2 mg via INTRAVENOUS

## 2012-09-16 MED ORDER — LACTATED RINGERS IV SOLN
INTRAVENOUS | Status: DC
  Administered 2012-09-16: 10:00:00 via INTRAVENOUS

## 2012-09-16 MED ORDER — SODIUM CHLORIDE 0.9 % IR SOLN
Status: DC | PRN
  Administered 2012-09-16: 6000 mL

## 2012-09-16 MED ORDER — OXYCODONE-ACETAMINOPHEN 5-325 MG PO TABS
1.0000 | ORAL_TABLET | ORAL | Status: DC | PRN
  Administered 2012-09-16: 2 via ORAL
  Administered 2012-09-16: 1 via ORAL
  Administered 2012-09-17 (×2): 2 via ORAL

## 2012-09-16 MED ORDER — ONDANSETRON HCL 4 MG/2ML IJ SOLN
INTRAMUSCULAR | Status: DC | PRN
  Administered 2012-09-16: 4 mg via INTRAVENOUS

## 2012-09-16 SURGICAL SUPPLY — 38 items
BANDAGE ELASTIC 6 VELCRO ST LF (GAUZE/BANDAGES/DRESSINGS) ×2 IMPLANT
BLADE CUDA 5.5 (BLADE) IMPLANT
BLADE CUDA GRT WHITE 3.5 (BLADE) IMPLANT
BLADE CUTTER GATOR 3.5 (BLADE) ×2 IMPLANT
BLADE CUTTER MENIS 5.5 (BLADE) IMPLANT
BLADE GREAT WHITE 4.2 (BLADE) ×2 IMPLANT
BUR OVAL 4.0 (BURR) IMPLANT
CANISTER OMNI JUG 16 LITER (MISCELLANEOUS) ×2 IMPLANT
CANISTER SUCTION 2500CC (MISCELLANEOUS) IMPLANT
CLOTH BEACON ORANGE TIMEOUT ST (SAFETY) ×2 IMPLANT
CUTTER MENISCUS  4.2MM (BLADE)
CUTTER MENISCUS 4.2MM (BLADE) IMPLANT
DRAPE ARTHROSCOPY W/POUCH 90 (DRAPES) ×2 IMPLANT
DURAPREP 26ML APPLICATOR (WOUND CARE) ×2 IMPLANT
ELECT MENISCUS 165MM 90D (ELECTRODE) ×2 IMPLANT
ELECT REM PT RETURN 9FT ADLT (ELECTROSURGICAL) ×2
ELECTRODE REM PT RTRN 9FT ADLT (ELECTROSURGICAL) ×1 IMPLANT
GAUZE XEROFORM 1X8 LF (GAUZE/BANDAGES/DRESSINGS) ×2 IMPLANT
GLOVE BIOGEL PI IND STRL 7.5 (GLOVE) ×1 IMPLANT
GLOVE BIOGEL PI INDICATOR 7.5 (GLOVE) ×1
GLOVE EXAM NITRILE LRG STRL (GLOVE) ×2 IMPLANT
GLOVE ORTHO TXT STRL SZ7.5 (GLOVE) ×4 IMPLANT
GLOVE SURG SS PI 7.5 STRL IVOR (GLOVE) ×2 IMPLANT
GOWN PREVENTION PLUS XLARGE (GOWN DISPOSABLE) ×2 IMPLANT
GOWN STRL REIN 2XL XLG LVL4 (GOWN DISPOSABLE) ×2 IMPLANT
HOLDER KNEE FOAM BLUE (MISCELLANEOUS) ×2 IMPLANT
IV NS IRRIG 3000ML ARTHROMATIC (IV SOLUTION) ×4 IMPLANT
KNEE WRAP E Z 3 GEL PACK (MISCELLANEOUS) ×2 IMPLANT
PACK ARTHROSCOPY DSU (CUSTOM PROCEDURE TRAY) ×2 IMPLANT
PACK BASIN DAY SURGERY FS (CUSTOM PROCEDURE TRAY) ×2 IMPLANT
PENCIL BUTTON HOLSTER BLD 10FT (ELECTRODE) ×2 IMPLANT
SET ARTHROSCOPY TUBING (MISCELLANEOUS) ×1
SET ARTHROSCOPY TUBING LN (MISCELLANEOUS) ×1 IMPLANT
SPONGE GAUZE 4X4 12PLY (GAUZE/BANDAGES/DRESSINGS) ×4 IMPLANT
SUT ETHILON 3 0 PS 1 (SUTURE) ×2 IMPLANT
SUT VIC AB 3-0 FS2 27 (SUTURE) IMPLANT
TOWEL OR 17X24 6PK STRL BLUE (TOWEL DISPOSABLE) ×2 IMPLANT
WATER STERILE IRR 1000ML POUR (IV SOLUTION) ×2 IMPLANT

## 2012-09-16 NOTE — Transfer of Care (Signed)
Immediate Anesthesia Transfer of Care Note  Patient: Gerald [name]  Procedure(s) Performed: Procedure(s) (LRB): RIGHT KNEE ARTHROSCOPY WITH MEDIAL AND LATERAL MENISECTOMY, CHONDROPLASTY (Right)  Patient Location: PACU  Anesthesia Type: General  Level of Consciousness: awake, oriented, sedated and patient cooperative  Airway & Oxygen Therapy: Patient Spontanous Breathing and Patient connected to face mask oxygen  Post-op Assessment: Report given to PACU RN and Post -op Vital signs reviewed and stable  Post vital signs: Reviewed and stable  Complications: No apparent anesthesia complications

## 2012-09-16 NOTE — Op Note (Deleted)
NAMEMarland Kitchen  [name], Gerald [name] NO.:  [id number]  MEDICAL RECORD NO.:  [id number]  LOCATION:  2920                         FACILITY:  [facility]  PHYSICIAN:  Loreta Ave, M.D. DATE OF BIRTH:  [birth year]  DATE OF PROCEDURE:  09/16/2012 DATE OF DISCHARGE:  06/04/2012                              OPERATIVE REPORT   PREOPERATIVE DIAGNOSIS:  Right knee medial and lateral meniscus tears.  POSTOPERATIVE DIAGNOSIS:  Right knee medial and lateral meniscus tears with tricompartmental chondromalacia, grade 3, all 3 compartments with chondral loose bodies and reactive synovitis.  PROCEDURE:  Right knee exam under anesthesia, arthroscopy.  Debridement of medial and lateral meniscus tears.  Tricompartmental chondroplasty and synovectomy.  SURGEON:  Loreta Ave, M.D.  ANESTHESIA:  General.  BLOOD LOSS:  Minimal.  SPECIMENS:  None.  CULTURES:  None.  COMPLICATION:  None.  DRESSING:  Soft compressive.  TOURNIQUET:  Not employed.  DESCRIPTION OF PROCEDURE:  Patient was brought to operating room and placed on the operating table in supine position.  After adequate anesthesia had been obtained, leg holder applied.  Leg prepped and draped in usual sterile fashion.  Two portals, one each medial and lateral parapatellar.  Arthroscope was induced.  Knee was distended and inspected.  Good tracking patellofemoral joint but grade 2-3 changes throughout.  Chondroplasty to a stable surface.  A lot of reactive synovitis throughout debrided.  Cruciate ligaments intact.  Medial compartment grade 3 changes on the condyle debrided.  Large flap off the anterior horn as well as radial tearing throughout.  All of the tears removed, tapered into remaining meniscus throughout.  Laterally, extensive grade 3 changes over the entire weightbearing dome.  Radial tears throughout.  Saucerized out to a stable rim.  Chondroplasty to a stable surface.  Entire knee examined.  No other findings were  appreciated. Instruments were removed.  Knee injected with Depo-Medrol and Marcaine. Portals were closed with nylon.  Sterile compressive dressing applied. Anesthesia reversed.  Brought to the recovery room.  Tolerated the surgery well.  No complications.     Loreta Ave, M.D.     DFM/MEDQ  D:  09/16/2012  T:  09/16/2012  Job:  [id number]

## 2012-09-16 NOTE — Anesthesia Procedure Notes (Signed)
Procedure Name: LMA Insertion Date/Time: 09/16/2012 11:12 AM Performed by: Renella Cunas D Pre-anesthesia Checklist: Patient identified, Emergency Drugs available, Suction available and Patient being monitored Patient Re-evaluated:Patient Re-evaluated prior to inductionOxygen Delivery Method: Circle System Utilized Preoxygenation: Pre-oxygenation with 100% oxygen Intubation Type: IV induction Ventilation: Mask ventilation without difficulty LMA: LMA inserted LMA Size: 5.0 Number of attempts: 1 Airway Equipment and Method: bite block Placement Confirmation: positive ETCO2 Tube secured with: Tape Dental Injury: Teeth and Oropharynx as per pre-operative assessment

## 2012-09-16 NOTE — Op Note (Signed)
NAME:  Valeri, Yossef             ACCOUNT NO.:  [id number]  MEDICAL RECORD NO.:  [id number]  LOCATION:  2920                         FACILITY:  [facility]  PHYSICIAN:  Demitrious Mccannon F. Aiesha Leland, M.D. DATE OF BIRTH:  03/10/1965  DATE OF PROCEDURE:  09/16/2012 DATE OF DISCHARGE:  06/04/2012                              OPERATIVE REPORT   PREOPERATIVE DIAGNOSIS:  Right knee medial and lateral meniscus tears.  POSTOPERATIVE DIAGNOSIS:  Right knee medial and lateral meniscus tears with tricompartmental chondromalacia, grade 3, all 3 compartments with chondral loose bodies and reactive synovitis.  PROCEDURE:  Right knee exam under anesthesia, arthroscopy.  Debridement of medial and lateral meniscus tears.  Tricompartmental chondroplasty and synovectomy.  SURGEON:  Flavius Repsher F. Berkleigh Beckles, M.D.  ANESTHESIA:  General.  BLOOD LOSS:  Minimal.  SPECIMENS:  None.  CULTURES:  None.  COMPLICATION:  None.  DRESSING:  Soft compressive.  TOURNIQUET:  Not employed.  DESCRIPTION OF PROCEDURE:  Patient was brought to operating room and placed on the operating table in supine position.  After adequate anesthesia had been obtained, leg holder applied.  Leg prepped and draped in usual sterile fashion.  Two portals, one each medial and lateral parapatellar.  Arthroscope was induced.  Knee was distended and inspected.  Good tracking patellofemoral joint but grade 2-3 changes throughout.  Chondroplasty to a stable surface.  A lot of reactive synovitis throughout debrided.  Cruciate ligaments intact.  Medial compartment grade 3 changes on the condyle debrided.  Large flap off the anterior horn as well as radial tearing throughout.  All of the tears removed, tapered into remaining meniscus throughout.  Laterally, extensive grade 3 changes over the entire weightbearing dome.  Radial tears throughout.  Saucerized out to a stable rim.  Chondroplasty to a stable surface.  Entire knee examined.  No other findings were  appreciated. Instruments were removed.  Knee injected with Depo-Medrol and Marcaine. Portals were closed with nylon.  Sterile compressive dressing applied. Anesthesia reversed.  Brought to the recovery room.  Tolerated the surgery well.  No complications.     Jynesis Nakamura F. Kru Allman, M.D.     DFM/MEDQ  D:  09/16/2012  T:  09/16/2012  Job:  [id number] 

## 2012-09-16 NOTE — Interval H&P Note (Signed)
History and Physical Interval Note:  09/16/2012 7:26 AM  Gerald [name]  has presented today for surgery, with the diagnosis of RIGHT KNEE TEAR MENISCUS MEDIAL KNEE  The various methods of treatment have been discussed with the patient and family. After consideration of risks, benefits and other options for treatment, the patient has consented to  Procedure(s) with comments: RIGHT KNEE ARTHROSCOPY WITH MEDIAL MENISECTOMY (Right) - RIGHT KNEE SCOPE MEDIAL MENISCECTOMY as a surgical intervention .  The patient's history has been reviewed, patient examined, no change in status, stable for surgery.  I have reviewed the patient's chart and labs.  Questions were answered to the patient's satisfaction.     [name] F

## 2012-09-16 NOTE — Anesthesia Preprocedure Evaluation (Signed)
Anesthesia Evaluation  Patient identified by MRN, date of birth, ID band Patient awake    Reviewed: Allergy & Precautions, H&P , NPO status , Patient's Chart, lab work & pertinent test results, reviewed documented beta blocker date and time   Airway Mallampati: I TM Distance: >3 FB Neck ROM: Full    Dental  (+) Teeth Intact and Dental Advisory Given   Pulmonary  breath sounds clear to auscultation        Cardiovascular hypertension, Pt. on medications and Pt. on home beta blockers + CAD and + Past MI Rhythm:Regular Rate:Normal     Neuro/Psych    GI/Hepatic   Endo/Other  diabetes, Well Controlled, Type 2, Oral Hypoglycemic AgentsMorbid obesity  Renal/GU      Musculoskeletal   Abdominal   Peds  Hematology   Anesthesia Other Findings   Reproductive/Obstetrics                           Anesthesia Physical Anesthesia Plan  ASA: III  Anesthesia Plan: General   Post-op Pain Management:    Induction: Intravenous  Airway Management Planned: LMA  Additional Equipment:   Intra-op Plan:   Post-operative Plan: Extubation in OR  Informed Consent: I have reviewed the patients History and Physical, chart, labs and discussed the procedure including the risks, benefits and alternatives for the proposed anesthesia with the patient or authorized representative who has indicated his/her understanding and acceptance.   Dental advisory given  Plan Discussed with: CRNA, Anesthesiologist and Surgeon  Anesthesia Plan Comments:         Anesthesia Quick Evaluation

## 2012-09-16 NOTE — Anesthesia Postprocedure Evaluation (Signed)
  Anesthesia Post-op Note  Patient: Gerald [name]  Procedure(s) Performed: Procedure(s) with comments: RIGHT KNEE ARTHROSCOPY WITH MEDIAL AND LATERAL MENISECTOMY, CHONDROPLASTY (Right) - RIGHT KNEE SCOPE MEDIAL MENISCECTOMY  Patient Location: PACU  Anesthesia Type:General  Level of Consciousness: awake, alert  and oriented  Airway and Oxygen Therapy: Patient Spontanous Breathing and Patient connected to face mask oxygen  Post-op Pain: mild  Post-op Assessment: Post-op Vital signs reviewed  Post-op Vital Signs: Reviewed  Complications: No apparent anesthesia complications

## 2012-09-17 ENCOUNTER — Encounter (HOSPITAL_BASED_OUTPATIENT_CLINIC_OR_DEPARTMENT_OTHER): Payer: Self-pay | Admitting: Orthopedic Surgery

## 2012-10-15 ENCOUNTER — Encounter (HOSPITAL_COMMUNITY): Payer: Self-pay | Admitting: Cardiology

## 2012-10-15 ENCOUNTER — Emergency Department (HOSPITAL_COMMUNITY)
Admission: EM | Admit: 2012-10-15 | Discharge: 2012-10-15 | Disposition: A | Discharge status: Home / Self Care | Payer: 59 | Attending: Emergency Medicine | Admitting: Emergency Medicine

## 2012-10-15 ENCOUNTER — Encounter (INDEPENDENT_AMBULATORY_CARE_PROVIDER_SITE_OTHER): Payer: 59

## 2012-10-15 ENCOUNTER — Telehealth: Payer: Self-pay | Admitting: *Deleted

## 2012-10-15 DIAGNOSIS — E119 Type 2 diabetes mellitus without complications: Secondary | ICD-10-CM | POA: Insufficient documentation

## 2012-10-15 DIAGNOSIS — Z9889 Other specified postprocedural states: Secondary | ICD-10-CM | POA: Insufficient documentation

## 2012-10-15 DIAGNOSIS — E669 Obesity, unspecified: Secondary | ICD-10-CM | POA: Insufficient documentation

## 2012-10-15 DIAGNOSIS — M2550 Pain in unspecified joint: Secondary | ICD-10-CM | POA: Insufficient documentation

## 2012-10-15 DIAGNOSIS — M79609 Pain in unspecified limb: Secondary | ICD-10-CM

## 2012-10-15 DIAGNOSIS — Z9861 Coronary angioplasty status: Secondary | ICD-10-CM | POA: Insufficient documentation

## 2012-10-15 DIAGNOSIS — Z79899 Other long term (current) drug therapy: Secondary | ICD-10-CM | POA: Insufficient documentation

## 2012-10-15 DIAGNOSIS — Z8669 Personal history of other diseases of the nervous system and sense organs: Secondary | ICD-10-CM | POA: Insufficient documentation

## 2012-10-15 DIAGNOSIS — Z7982 Long term (current) use of aspirin: Secondary | ICD-10-CM | POA: Insufficient documentation

## 2012-10-15 DIAGNOSIS — R209 Unspecified disturbances of skin sensation: Secondary | ICD-10-CM | POA: Insufficient documentation

## 2012-10-15 DIAGNOSIS — I252 Old myocardial infarction: Secondary | ICD-10-CM | POA: Insufficient documentation

## 2012-10-15 DIAGNOSIS — E785 Hyperlipidemia, unspecified: Secondary | ICD-10-CM | POA: Insufficient documentation

## 2012-10-15 DIAGNOSIS — I2510 Atherosclerotic heart disease of native coronary artery without angina pectoris: Secondary | ICD-10-CM | POA: Insufficient documentation

## 2012-10-15 DIAGNOSIS — R599 Enlarged lymph nodes, unspecified: Secondary | ICD-10-CM | POA: Insufficient documentation

## 2012-10-15 DIAGNOSIS — R590 Localized enlarged lymph nodes: Secondary | ICD-10-CM

## 2012-10-15 DIAGNOSIS — M7989 Other specified soft tissue disorders: Secondary | ICD-10-CM

## 2012-10-15 DIAGNOSIS — I10 Essential (primary) hypertension: Secondary | ICD-10-CM | POA: Insufficient documentation

## 2012-10-15 MED ORDER — DOXYCYCLINE HYCLATE 100 MG PO CAPS: 100.0000 mg | ORAL_CAPSULE | Freq: Two times a day (BID) | ORAL | Status: DC

## 2012-10-15 MED ORDER — DOXYCYCLINE HYCLATE 100 MG PO TABS
100.0000 mg | ORAL_TABLET | Freq: Once | ORAL | Status: AC
  Administered 2012-10-15: 100 mg via ORAL
  Filled 2012-10-15: qty 1

## 2012-10-15 NOTE — Telephone Encounter (Signed)
Patient called back, he is going to [place] to be evaluated.

## 2012-10-15 NOTE — Telephone Encounter (Signed)
Agreed. Spoke with PA who had already told him to go to ER.

## 2012-10-15 NOTE — ED Notes (Signed)
Pt reports he had knee surgery back in august 15th. States the lower leg started to become painful and today the area became very swollen and numb. States he was sent for a [place] today and told he does not have a clot but that his lymph nodes are swollen.

## 2012-10-15 NOTE — ED Provider Notes (Addendum)
CSN: [id number]     Arrival date & time 10/15/12  1531 History   First MD Initiated Contact with Patient 10/15/12 1849     Chief Complaint  Patient presents with  . Numbness  . Leg Swelling   (Consider location/radiation/quality/duration/timing/severity/associated sxs/prior Treatment) HPI Comments: Pt had arthroscopic surgery of right knee 3-4 weeks ago, has been walking, but still limping some.  Pt has felt some tightness to it, now feels like it is throbbing and numb together.  No h/o recent trauma, laceration, abrasions, bites.  No h/o neuropathy in the past.  Skin got very red per pt's mother earlier today.  Not hot that he knows of, no fevers, chills.  He spoke to on call physician for [facility] and had an U/S done earlier today.  Reportedly no DVT, however lymph nodes were enlarged.  He spoke to PCP office and assistant directed the patient to the ED.  His mother has had problems with lymphadenopathy which concerned him.    Patient is a 47 y.o. male presenting with leg pain. The history is provided by the patient and a relative.  Leg Pain Location:  Leg Leg location:  R lower leg Pain details:    Quality:  Throbbing and tingling   Radiates to:  Does not radiate   Severity:  Moderate   Onset quality:  Gradual   Duration:  2 weeks   Progression:  Worsening Associated symptoms: swelling and tingling   Associated symptoms: no back pain, no fever and no muscle weakness   Risk factors: obesity     Past Medical History  Diagnosis Date  . Myocardial infarction 09/2011  . Hypertension   . Hyperlipidemia   . Obesity   . Diabetes mellitus without complication     borderline  . Nonischemic cardiomyopathy     Previous ejection fraction of 35-40% on echo. 50% on cardiac catheterization in August of 2013  . Sleep apnea   . Coronary artery disease     Mild nonobstructive coronary artery disease on previous cardiac catheterization most recently in August of 2013   Past Surgical  History  Procedure Laterality Date  . Colonoscopy    . Cardiac catheterization N/A 09/2011    [facility]; EF 50% with 30% mid LAD stenosis and no obstructive disease.  . Cardiac catheterization  09/2010    [facility]; Mid LAD 40% stenosis; Mid Circumflex:Normal; Mid RCA; Normal  . Knee arthroscopy with medial menisectomy Right 09/16/2012    Procedure: RIGHT KNEE ARTHROSCOPY WITH MEDIAL AND LATERAL MENISECTOMY, CHONDROPLASTY;  Surgeon: Loreta Ave, MD;  Location: [facility];  Service: Orthopedics;  Laterality: Right;  RIGHT KNEE SCOPE MEDIAL MENISCECTOMY   Family History  Problem Relation Age of Onset  . Heart disease Father 41    MI  . Heart attack Father   . Hypertension Mother    History  Substance Use Topics  . Smoking status: Never Smoker   . Smokeless tobacco: Not on file  . Alcohol Use: Yes     Comment: rare    Review of Systems  Constitutional: Negative for fever and chills.  Respiratory: Negative for cough and shortness of breath.   Cardiovascular: Positive for leg swelling. Negative for chest pain.  Musculoskeletal: Positive for arthralgias. Negative for back pain.  Skin: Positive for color change. Negative for wound.  Neurological: Positive for numbness. Negative for weakness.    Allergies  Statins  Home Medications   Current Outpatient Rx  Name  Route  Sig  Dispense  Refill  . aspirin 81 MG EC tablet   Oral   Take 81 mg by mouth every evening. Swallow whole.         . furosemide (LASIX) 20 MG tablet   Oral   Take 1 tablet (20 mg total) by mouth daily.   90 tablet   1   . gemfibrozil (LOPID) 600 MG tablet   Oral   Take 1 tablet (600 mg total) by mouth 2 (two) times daily before a meal.   180 tablet   1   . lisinopril (PRINIVIL,ZESTRIL) 5 MG tablet   Oral   Take 1 tablet (5 mg total) by mouth daily.   90 tablet   1   . metFORMIN (GLUCOPHAGE) 500 MG tablet   Oral   Take 500 mg by mouth daily with breakfast.         . metoprolol  succinate (TOPROL-XL) 25 MG 24 hr tablet   Oral   Take 1 tablet (25 mg total) by mouth daily.   90 tablet   3   . nitroGLYCERIN (NITROSTAT) 0.4 MG SL tablet   Sublingual   Place 1 tablet (0.4 mg total) under the tongue every 5 (five) minutes as needed for chest pain.   25 tablet   3   . potassium chloride SA (K-DUR,KLOR-CON) 20 MEQ tablet   Oral   Take 1 tablet (20 mEq total) by mouth daily.   90 tablet   1   . Blood Glucose Monitoring Suppl (ONE TOUCH ULTRA SYSTEM KIT) W/DEVICE KIT   Does not apply   1 kit by Does not apply route once.   1 each   0   . doxycycline (VIBRAMYCIN) 100 MG capsule   Oral   Take 1 capsule (100 mg total) by mouth 2 (two) times daily.   20 capsule   0   . glucose blood test strip      Use as instructed   100 each   12    BP 160/93  Pulse 85  Temp(Src) 98 F (36.7 C) (Oral)  Resp 18  Ht 6\' 3"  (1.905 m)  Wt 320 lb (145.151 kg)  BMI 40 kg/m2  SpO2 98% Physical Exam  Nursing note and vitals reviewed. Constitutional: He is oriented to person, place, and time. He appears well-developed and well-nourished. No distress.  Neck: Neck supple.  Cardiovascular: Intact distal pulses.   Pulses:      Dorsalis pedis pulses are 1+ on the right side, and 1+ on the left side.  Pulmonary/Chest: Effort normal. No respiratory distress.  Abdominal: Soft.  Musculoskeletal:       Right lower leg: He exhibits tenderness, swelling and edema.  LLE is 2+ pitting, RLE is also 2-3+ pitting, skin seems more firm, somewhat shinier.  However compartments are soft, good color, not cyanotic, slightly erythematous without significant heat or warmth  Neurological: He is alert and oriented to person, place, and time. He displays no tremor. A sensory deficit is present. He exhibits normal muscle tone.  Decreased subjective sensation, but intact, gross sensation is present to soft touch.  Ambulatory with mild limping on right lower leg, able to dorsiflex against weight   Skin: Skin is warm. No rash noted. He is not diaphoretic.  Psychiatric: He has a normal mood and affect.    ED Course  Procedures (including critical care time) Labs Review Labs Reviewed - No data to display Imaging Review No results found.  RA sat is 98%  and I interpret to be normal  MDM   1. Right leg swelling   2. Lymphadenopathy, inguinal    Pt with intact distal pulses, no abd discoloration, although skin is mildly shiny, soft compartments, 2-3+ pitting edema.  No signs of sig cellulitis, pt can dorsi flex and plantar flex with no sig change in pain.  Pulses are symmetric.  Given report shows multiple enlarged lymph nodes, will treat as cellulitis with doxycyline.  No DVT seen based on report.  Pt and family are in agreement and pt can follow up with Dr. Dayton Martes next week for close recheck.      Gavin Pound. Oletta Lamas, MD 10/15/12 1931  Gavin Pound. Tyria Springer, MD 10/16/12 1041

## 2012-10-15 NOTE — Telephone Encounter (Signed)
Phone call from patient.  He had ultrasound this morning to rule out clot in his leg.  The test showed no clots but pt states they told him they are concerned that something is going on with his lymph nodes and that he needs to see you.  He states his leg and foot are still swollen and hurting.  Advised him to call the office where he had the ultrasound and ask them to send you a copy of the report.  Also advised him to have the PA call you back to discuss.  Advised patient that he may need to go to ER for evaluation.

## 2012-10-15 NOTE — Discharge Instructions (Signed)
 Lymphadenopathy Lymphadenopathy means disease of the lymph glands. But the term is usually used to describe swollen or enlarged lymph glands, also called lymph nodes. These are the bean-shaped organs found in many locations including the neck, underarm, and groin. Lymph glands are part of the immune system, which fights infections in your body. Lymphadenopathy can occur in just one area of the body, such as the neck, or it can be generalized, with lymph node enlargement in several areas. The nodes found in the neck are the most common sites of lymphadenopathy. CAUSES  When your immune system responds to germs (such as viruses or bacteria ), infection-fighting cells and fluid build up. This causes the glands to grow in size. This is usually not something to worry about. Sometimes, the glands themselves can become infected and inflamed. This is called lymphadenitis. Enlarged lymph nodes can be caused by many diseases:  Bacterial disease, such as strep throat or a skin infection.  Viral disease, such as a common cold.  Other germs, such as lyme disease, tuberculosis, or sexually transmitted diseases.  Cancers, such as lymphoma (cancer of the lymphatic system) or leukemia (cancer of the white blood cells).  Inflammatory diseases such as lupus or rheumatoid arthritis.  Reactions to medications. Many of the diseases above are rare, but important. This is why you should see your caregiver if you have lymphadenopathy. SYMPTOMS   Swollen, enlarged lumps in the neck, back of the head or other locations.  Tenderness.  Warmth or redness of the skin over the lymph nodes.  Fever. DIAGNOSIS  Enlarged lymph nodes are often near the source of infection. They can help healthcare providers diagnose your illness. For instance:   Swollen lymph nodes around the jaw might be caused by an infection in the mouth.  Enlarged glands in the neck often signal a throat infection.  Lymph nodes that are swollen  in more than one area often indicate an illness caused by a virus. Your caregiver most likely will know what is causing your lymphadenopathy after listening to your history and examining you. Blood tests, x-rays or other tests may be needed. If the cause of the enlarged lymph node cannot be found, and it does not go away by itself, then a biopsy may be needed. Your caregiver will discuss this with you. TREATMENT  Treatment for your enlarged lymph nodes will depend on the cause. Many times the nodes will shrink to normal size by themselves, with no treatment. Antibiotics or other medicines may be needed for infection. Only take over-the-counter or prescription medicines for pain, discomfort or fever as directed by your caregiver. HOME CARE INSTRUCTIONS  Swollen lymph glands usually return to normal when the underlying medical condition goes away. If they persist, contact your health-care provider. He/she might prescribe antibiotics or other treatments, depending on the diagnosis. Take any medications exactly as prescribed. Keep any follow-up appointments made to check on the condition of your enlarged nodes.  SEEK MEDICAL CARE IF:   Swelling lasts for more than two weeks.  You have symptoms such as weight loss, night sweats, fatigue or fever that does not go away.  The lymph nodes are hard, seem fixed to the skin or are growing rapidly.  Skin over the lymph nodes is red and inflamed. This could mean there is an infection. SEEK IMMEDIATE MEDICAL CARE IF:   Fluid starts leaking from the area of the enlarged lymph node.  You develop a fever of 102 F (38.9 C) or greater.  Severe  pain develops (not necessarily at the site of a large lymph node).  You develop chest pain or shortness of breath.  You develop worsening abdominal pain. MAKE SURE YOU:   Understand these instructions.  Will watch your condition.  Will get help right away if you are not doing well or get worse. Document  Released: 10/30/2007 Document Revised: 04/14/2011 Document Reviewed: 10/30/2007 [facility] Patient Information 2014 [place], [place].    Edema Edema is an abnormal build-up of fluids in tissues. Because this is partly dependent on gravity (water flows to the lowest place), it is more common in the legs and thighs (lower extremities). It is also common in the looser tissues, like around the eyes. Painless swelling of the feet and ankles is common and increases as a person ages. It may affect both legs and may include the calves or even thighs. When squeezed, the fluid may move out of the affected area and may leave a dent for a few moments. CAUSES   Prolonged standing or sitting in one place for extended periods of time. Movement helps pump tissue fluid into the veins, and absence of movement prevents this, resulting in edema.  Varicose veins. The valves in the veins do not work as well as they should. This causes fluid to leak into the tissues.  Fluid and salt overload.  Injury, burn, or surgery to the leg, ankle, or foot, may damage veins and allow fluid to leak out.  Sunburn damages vessels. Leaky vessels allow fluid to go out into the sunburned tissues.  Allergies (from insect bites or stings, medications or chemicals) cause swelling by allowing vessels to become leaky.  Protein in the blood helps keep fluid in your vessels. Low protein, as in malnutrition, allows fluid to leak out.  Hormonal changes, including pregnancy and menstruation, cause fluid retention. This fluid may leak out of vessels and cause edema.  Medications that cause fluid retention. Examples are sex hormones, blood pressure medications, steroid treatment, or anti-depressants.  Some illnesses cause edema, especially heart failure, kidney disease, or liver disease.  Surgery that cuts veins or lymph nodes, such as surgery done for the heart or for breast cancer, may result in edema. DIAGNOSIS  Your caregiver is usually  easily able to determine what is causing your swelling (edema) by simply asking what is wrong (getting a history) and examining you (doing a physical). Sometimes x-rays, EKG (electrocardiogram or heart tracing), and blood work may be done to evaluate for underlying medical illness. TREATMENT  General treatment includes:  Leg elevation (or elevation of the affected body part).  Restriction of fluid intake.  Prevention of fluid overload.  Compression of the affected body part. Compression with elastic bandages or support stockings squeezes the tissues, preventing fluid from entering and forcing it back into the blood vessels.  Diuretics (also called water pills or fluid pills) pull fluid out of your body in the form of increased urination. These are effective in reducing the swelling, but can have side effects and must be used only under your caregiver's supervision. Diuretics are appropriate only for some types of edema. The specific treatment can be directed at any underlying causes discovered. Heart, liver, or kidney disease should be treated appropriately. HOME CARE INSTRUCTIONS   Elevate the legs (or affected body part) above the level of the heart, while lying down.  Avoid sitting or standing still for prolonged periods of time.  Avoid putting anything directly under the knees when lying down, and do not wear constricting  clothing or garters on the upper legs.  Exercising the legs causes the fluid to work back into the veins and lymphatic channels. This may help the swelling go down.  The pressure applied by elastic bandages or support stockings can help reduce ankle swelling.  A low-salt diet may help reduce fluid retention and decrease the ankle swelling.  Take any medications exactly as prescribed. SEEK MEDICAL CARE IF:  Your edema is not responding to recommended treatments. SEEK IMMEDIATE MEDICAL CARE IF:   You develop shortness of breath or chest pain.  You cannot breathe  when you lay down; or if, while lying down, you have to get up and go to the window to get your breath.  You are having increasing swelling without relief from treatment.  You develop a fever over 102 F (38.9 C).  You develop pain or redness in the areas that are swollen.  Tell your caregiver right away if you have gained 3 lb/1.4 kg in 1 day or 5 lb/2.3 kg in a week. MAKE SURE YOU:   Understand these instructions.  Will watch your condition.  Will get help right away if you are not doing well or get worse. Document Released: 01/20/2005 Document Revised: 07/22/2011 Document Reviewed: 09/08/2007 [facility] Patient Information 2014 [place], [place].

## 2012-11-30 ENCOUNTER — Ambulatory Visit: Payer: 59

## 2013-01-13 ENCOUNTER — Encounter: Payer: Self-pay | Admitting: Internal Medicine

## 2013-01-13 ENCOUNTER — Ambulatory Visit (INDEPENDENT_AMBULATORY_CARE_PROVIDER_SITE_OTHER): Payer: 59 | Admitting: Internal Medicine

## 2013-01-13 VITALS — BP 158/102 | HR 78 | Temp 98.2°F | Wt 322.0 lb

## 2013-01-13 DIAGNOSIS — F329 Major depressive disorder, single episode, unspecified: Secondary | ICD-10-CM | POA: Insufficient documentation

## 2013-01-13 MED ORDER — CITALOPRAM HYDROBROMIDE 20 MG PO TABS: 20.0000 mg | ORAL_TABLET | Freq: Every day | ORAL | Status: DC

## 2013-01-13 NOTE — Progress Notes (Signed)
Pre-visit discussion using our clinic review tool. No additional management support is needed unless otherwise documented below in the visit note.  

## 2013-01-13 NOTE — Patient Instructions (Signed)

## 2013-01-13 NOTE — Progress Notes (Signed)
Subjective:    Patient ID: Gerald [name], male    DOB: 17-May-1965, 47 y.o.   MRN: [id number]  HPI  Pt presents to the clinic today with c/o anxiety and depression. This is being caused by a stressful life situation. His wife is sick. He has to do everything around the house, take care of the kids and work a full time job which adds to the stress. He feels like he is not coping very well and has little support. He feels like there is no one he can talk to about his situation. He has never been treated for anxiety, depression or stress in the past. He tries to cope with prayer, but feels at this point he needs a little more help. He denies SI/HI.   Review of Systems  Past Medical History  Diagnosis Date  . Myocardial infarction 09/2011  . Hypertension   . Hyperlipidemia   . Obesity   . Diabetes mellitus without complication     borderline  . Nonischemic cardiomyopathy     Previous ejection fraction of 35-40% on echo. 50% on cardiac catheterization in August of 2013  . Sleep apnea   . Coronary artery disease     Mild nonobstructive coronary artery disease on previous cardiac catheterization most recently in August of 2013    Current Outpatient Prescriptions  Medication Sig Dispense Refill  . aspirin 81 MG EC tablet Take 81 mg by mouth every evening. Swallow whole.      . furosemide (LASIX) 20 MG tablet Take 1 tablet (20 mg total) by mouth daily.  90 tablet  1  . gemfibrozil (LOPID) 600 MG tablet Take 1 tablet (600 mg total) by mouth 2 (two) times daily before a meal.  180 tablet  1  . glucose blood test strip Use as instructed  100 each  12  . lisinopril (PRINIVIL,ZESTRIL) 5 MG tablet Take 1 tablet (5 mg total) by mouth daily.  90 tablet  1  . metoprolol succinate (TOPROL-XL) 25 MG 24 hr tablet Take 1 tablet (25 mg total) by mouth daily.  90 tablet  3  . nitroGLYCERIN (NITROSTAT) 0.4 MG SL tablet Place 1 tablet (0.4 mg total) under the tongue every 5 (five) minutes as needed for  chest pain.  25 tablet  3  . potassium chloride SA (K-DUR,KLOR-CON) 20 MEQ tablet Take 1 tablet (20 mEq total) by mouth daily.  90 tablet  1  . citalopram (CELEXA) 20 MG tablet Take 1 tablet (20 mg total) by mouth daily.  30 tablet  3   No current facility-administered medications for this visit.    Allergies  Allergen Reactions  . Statins Rash         Family History  Problem Relation Age of Onset  . Heart disease Father 61    MI  . Heart attack Father   . Hypertension Mother     History   Social History  . Marital Status: Single    Spouse Name: N/A    Number of Children: N/A  . Years of Education: N/A   Occupational History  . Not on file.   Social History Main Topics  . Smoking status: Never Smoker   . Smokeless tobacco: Not on file  . Alcohol Use: Yes     Comment: rare  . Drug Use: No  . Sexual Activity: Yes   Other Topics Concern  . Not on file   Social History Narrative   Married.  53 yo son.  Wife on disability for bipolar disorder.       Constitutional: Denies fever, malaise, fatigue, headache or abrupt weight changes.  Psych: Pt reports anxiety and depression. Denies SI/HI.  No other specific complaints in a complete review of systems (except as listed in HPI above).     Objective:   Physical Exam   BP 158/102  Pulse 78  Temp(Src) 98.2 F (36.8 C) (Oral)  Wt 322 lb (146.058 kg)  SpO2 98% Wt Readings from Last 3 Encounters:  01/13/13 322 lb (146.058 kg)  10/15/12 320 lb (145.151 kg)  09/16/12 314 lb 3.2 oz (142.52 kg)    General: Appears his stated age, obese but well developed, well nourished in NAD. Cardiovascular: Normal rate and rhythm. S1,S2 noted.  No murmur, rubs or gallops noted. No JVD or BLE edema. No carotid bruits noted. Pulmonary/Chest: Normal effort and positive vesicular breath sounds. No respiratory distress. No wheezes, rales or ronchi noted.  Psychiatric: Mood tearful and affect flat. Behavior is normal. Judgment and  thought content normal.     BMET    Component Value Date/Time   NA 142 09/16/2012 0932   K 4.0 09/16/2012 0932   CL 105 09/16/2012 0932   CO2 23 07/19/2012 1535   GLUCOSE 101* 09/16/2012 0932   BUN 17 09/16/2012 0932   CREATININE 1.00 09/16/2012 0932   CALCIUM 9.6 07/19/2012 1535   GFRNONAA 79* 06/04/2012 0430   GFRAA >90 06/04/2012 0430    Lipid Panel     Component Value Date/Time   CHOL 190 05/20/2012 0801   TRIG 145.0 05/20/2012 0801   HDL 28.70* 05/20/2012 0801   CHOLHDL 7 05/20/2012 0801   VLDL 29.0 05/20/2012 0801   LDLCALC 132* 05/20/2012 0801    CBC    Component Value Date/Time   WBC 8.1 06/04/2012 0430   RBC 4.68 06/04/2012 0430   HGB 13.9 09/16/2012 0932   HCT 41.0 09/16/2012 0932   PLT 222 06/04/2012 0430   MCV 82.5 06/04/2012 0430   MCH 29.1 06/04/2012 0430   MCHC 35.2 06/04/2012 0430   RDW 14.3 06/04/2012 0430   LYMPHSABS 2.5 12/20/2008 2002   MONOABS 0.7 12/20/2008 2002   EOSABS 0.1 12/20/2008 2002   BASOSABS 0.0 12/20/2008 2002    Hgb A1C Lab Results  Component Value Date   HGBA1C 6.6* 05/20/2012        Assessment & Plan:

## 2013-01-13 NOTE — Assessment & Plan Note (Signed)
Moderate at this point with lack of social support Support offered today I think pt would benefit from just discussing his situation with a therapist- he declines at this time Will trial celexa-discussed risk and benefits of therapy  RTC in 1 month or sooner if needed  More than 50% of time with pt spent counseling, offering support or coordinating care, approx 15 minutes

## 2013-01-19 ENCOUNTER — Ambulatory Visit: Payer: 59 | Admitting: Family Medicine

## 2013-01-24 ENCOUNTER — Ambulatory Visit (INDEPENDENT_AMBULATORY_CARE_PROVIDER_SITE_OTHER): Payer: 59 | Admitting: Internal Medicine

## 2013-01-24 ENCOUNTER — Encounter: Payer: Self-pay | Admitting: Internal Medicine

## 2013-01-24 VITALS — BP 142/88 | HR 81 | Temp 98.5°F | Wt 319.0 lb

## 2013-01-24 DIAGNOSIS — J019 Acute sinusitis, unspecified: Secondary | ICD-10-CM

## 2013-01-24 DIAGNOSIS — B9689 Other specified bacterial agents as the cause of diseases classified elsewhere: Secondary | ICD-10-CM

## 2013-01-24 MED ORDER — AMOXICILLIN-POT CLAVULANATE 875-125 MG PO TABS: 1.0000 | ORAL_TABLET | Freq: Two times a day (BID) | ORAL | Status: DC

## 2013-01-24 MED ORDER — METHYLPREDNISOLONE ACETATE 80 MG/ML IJ SUSP
80.0000 mg | Freq: Once | INTRAMUSCULAR | Status: AC
  Administered 2013-01-24: 80 mg via INTRAMUSCULAR

## 2013-01-24 NOTE — Progress Notes (Signed)
Pre-visit discussion using our clinic review tool. No additional management support is needed unless otherwise documented below in the visit note.  

## 2013-01-24 NOTE — Patient Instructions (Signed)

## 2013-01-24 NOTE — Progress Notes (Signed)
HPI    Review of Systems    Past Medical History  Diagnosis Date  . Myocardial infarction 09/2011  . Hypertension   . Hyperlipidemia   . Obesity   . Diabetes mellitus without complication     borderline  . Nonischemic cardiomyopathy     Previous ejection fraction of 35-40% on echo. 50% on cardiac catheterization in August of 2013  . Sleep apnea   . Coronary artery disease     Mild nonobstructive coronary artery disease on previous cardiac catheterization most recently in August of 2013    Family History  Problem Relation Age of Onset  . Heart disease Father 83    MI  . Heart attack Father   . Hypertension Mother     History   Social History  . Marital Status: Single    Spouse Name: N/A    Number of Children: N/A  . Years of Education: N/A   Occupational History  . Not on file.   Social History Main Topics  . Smoking status: Never Smoker   . Smokeless tobacco: Not on file  . Alcohol Use: Yes     Comment: rare  . Drug Use: No  . Sexual Activity: Yes   Other Topics Concern  . Not on file   Social History Narrative   Married.  35 yo son.   Wife on disability for bipolar disorder.      Allergies  Allergen Reactions  . Statins Rash          Constitutional: Positive headache, fatigue and fever. Denies abrupt weight changes.  HEENT:  Positive eye pain, pressure behind the eyes, facial pain, nasal congestion and sore throat. Denies eye redness, ear pain, ringing in the ears, wax buildup, runny nose or bloody nose. Respiratory: Positive cough and thick green sputum production. Denies difficulty breathing or shortness of breath.  Cardiovascular: Denies chest pain, chest tightness, palpitations or swelling in the hands or feet.   No other specific complaints in a complete review of systems (except as listed in HPI above).  Objective:    BP 142/88  Pulse 81  Temp(Src) 98.5 F (36.9 C) (Oral)  Wt 319 lb (144.697 kg)  SpO2 98% Wt Readings from Last 3  Encounters:  01/24/13 319 lb (144.697 kg)  01/13/13 322 lb (146.058 kg)  10/15/12 320 lb (145.151 kg)    General: Appears his stated age, well developed, well nourished in NAD. HEENT: Head: normal shape and size; Eyes: sclera white, no icterus, conjunctiva pink, PERRLA and EOMs intact; Ears: Tm's gray and intact, normal light reflex; Nose: mucosa pink and moist, septum midline; Throat/Mouth: + PND. Teeth present, mucosa pink and moist, no exudate noted, no lesions or ulcerations noted.  Neck: Mild cervical lymphadenopathy. Neck supple, trachea midline. No massses, lumps or thyromegaly present.  Cardiovascular: Normal rate and rhythm. S1,S2 noted.  No murmur, rubs or gallops noted. No JVD or BLE edema. No carotid bruits noted. Pulmonary/Chest: Normal effort and positive vesicular breath sounds. No respiratory distress. No wheezes, rales or ronchi noted.      Assessment & Plan:   Acute bacterial sinusitis  Can use a Neti Pot which can be purchased from your local drug store. Augmentin BID for 10 days 80 mg Depo IM today  RTC as needed or if symptoms persist.

## 2013-03-14 ENCOUNTER — Ambulatory Visit (INDEPENDENT_AMBULATORY_CARE_PROVIDER_SITE_OTHER): Payer: 59 | Admitting: Family Medicine

## 2013-03-14 ENCOUNTER — Encounter: Payer: Self-pay | Admitting: Family Medicine

## 2013-03-14 VITALS — BP 138/82 | HR 84 | Temp 97.3°F | Ht 72.0 in | Wt 330.5 lb

## 2013-03-14 DIAGNOSIS — R21 Rash and other nonspecific skin eruption: Secondary | ICD-10-CM

## 2013-03-14 DIAGNOSIS — R32 Unspecified urinary incontinence: Secondary | ICD-10-CM

## 2013-03-14 LAB — COMPREHENSIVE METABOLIC PANEL
ALBUMIN: 3.9 g/dL (ref 3.5–5.2)
ALK PHOS: 68 U/L (ref 39–117)
ALT: 38 U/L (ref 0–53)
AST: 23 U/L (ref 0–37)
BUN: 19 mg/dL (ref 6–23)
CO2: 25 mEq/L (ref 19–32)
Calcium: 8.9 mg/dL (ref 8.4–10.5)
Chloride: 100 mEq/L (ref 96–112)
Creatinine, Ser: 1 mg/dL (ref 0.4–1.5)
GFR: 82.5 mL/min (ref >60.00)
GLUCOSE: 224 mg/dL — AB (ref 70–99)
POTASSIUM: 3.8 meq/L (ref 3.5–5.1)
SODIUM: 135 meq/L (ref 135–145)
TOTAL PROTEIN: 7.3 g/dL (ref 6.0–8.3)
Total Bilirubin: 0.6 mg/dL (ref 0.3–1.2)

## 2013-03-14 LAB — POCT URINALYSIS DIPSTICK
Bilirubin, UA: NEGATIVE
Glucose, UA: 250
KETONES UA: NEGATIVE
LEUKOCYTES UA: NEGATIVE
Nitrite, UA: NEGATIVE
PH UA: 6
Spec Grav, UA: 1.02
Urobilinogen, UA: 0.2

## 2013-03-14 LAB — HEMOGLOBIN A1C: Hgb A1c MFr Bld: 6.5 % (ref 4.6–6.5)

## 2013-03-14 NOTE — Assessment & Plan Note (Signed)
Resolved.  History consistent with shingles. No tx necessary at this point. The patient indicates understanding of these issues and agrees with the plan.

## 2013-03-14 NOTE — Progress Notes (Signed)
Pre-visit discussion using our clinic review tool. No additional management support is needed unless otherwise documented below in the visit note.  

## 2013-03-14 NOTE — Patient Instructions (Signed)
Good to see you. I will call you as soon as I get your blood work results.  Get some rest.

## 2013-03-14 NOTE — Assessment & Plan Note (Signed)
UA neg for LE and nitrites but positive for blood and glucose. I suspect this is due to worsening DM. Will check a1c and send urine for cx given microscopic hematuria.  Advised to take a dose of his Metformin that he has at home when he gets home. The patient indicates understanding of these issues and agrees with the plan.

## 2013-03-14 NOTE — Progress Notes (Signed)
Subjective:   Patient ID: Gerald [name], male    DOB: [birth year], 48 y.o.   MRN: [id number]  Gerald [name] is a pleasant 48 y.o. year old male with h/o HTN, CAD, DM, HLD who presents to clinic today with lump in breast and Urinary Incontinence  on 03/14/2013  HPI:  1.  Urinary incontinence- having difficulty making to bathroom for past several weeks. Also notes increased thirst and urination.  No dysuria.  No fevers.  He has been more fatigued.  H/o diet controlled diabetes- previously on Metformin.   Lab Results  Component Value Date   HGBA1C 6.6* 05/20/2012   2.  Rash under left breast- felt a bumpy rash last week.  Rash has since scabbed over and resolved but area still sore. He cannot remember if the rash had fluid filled vesicles.  Patient Active Problem List   Diagnosis Date Noted  . Urinary incontinence 03/14/2013  . Rash and nonspecific skin eruption 03/14/2013  . Depression 01/13/2013  . Type 2 diabetes mellitus 09/14/2012  . Nonischemic cardiomyopathy   . HLD (hyperlipidemia) 05/28/2012  . CAD (coronary artery disease) 03/30/2012  . Chronic pain syndrome 03/30/2012  . HTN (hypertension) 03/30/2012   Past Medical History  Diagnosis Date  . Myocardial infarction 09/2011  . Hypertension   . Hyperlipidemia   . Obesity   . Diabetes mellitus without complication     borderline  . Nonischemic cardiomyopathy     Previous ejection fraction of 35-40% on echo. 50% on cardiac catheterization in August of 2013  . Sleep apnea   . Coronary artery disease     Mild nonobstructive coronary artery disease on previous cardiac catheterization most recently in August of 2013   Past Surgical History  Procedure Laterality Date  . Colonoscopy    . Cardiac catheterization N/A 09/2011    [facility]; EF 50% with 30% mid LAD stenosis and no obstructive disease.  . Cardiac catheterization  09/2010    [facility]; Mid LAD 40% stenosis; Mid Circumflex:Normal; Mid RCA; Normal  . Knee arthroscopy  with medial menisectomy Right 09/16/2012    Procedure: RIGHT KNEE ARTHROSCOPY WITH MEDIAL AND LATERAL MENISECTOMY, CHONDROPLASTY;  Surgeon: Ninetta Lights, MD;  Location: [facility];  Service: Orthopedics;  Laterality: Right;  RIGHT KNEE SCOPE MEDIAL MENISCECTOMY   History  Substance Use Topics  . Smoking status: Never Smoker   . Smokeless tobacco: Not on file  . Alcohol Use: Yes     Comment: rare   Family History  Problem Relation Age of Onset  . Heart disease Father 43    MI  . Heart attack Father   . Hypertension Mother    Allergies  Allergen Reactions  . Statins Rash        Current Outpatient Prescriptions on File Prior to Visit  Medication Sig Dispense Refill  . aspirin 81 MG EC tablet Take 81 mg by mouth every evening. Swallow whole.      . citalopram (CELEXA) 20 MG tablet Take 1 tablet (20 mg total) by mouth daily.  30 tablet  3  . furosemide (LASIX) 20 MG tablet Take 1 tablet (20 mg total) by mouth daily.  90 tablet  1  . gemfibrozil (LOPID) 600 MG tablet Take 1 tablet (600 mg total) by mouth 2 (two) times daily before a meal.  180 tablet  1  . glucose blood test strip Use as instructed  100 each  12  . lisinopril (PRINIVIL,ZESTRIL) 5 MG tablet Take 1 tablet (  5 mg total) by mouth daily.  90 tablet  1  . metoprolol succinate (TOPROL-XL) 25 MG 24 hr tablet Take 1 tablet (25 mg total) by mouth daily.  90 tablet  3  . nitroGLYCERIN (NITROSTAT) 0.4 MG SL tablet Place 1 tablet (0.4 mg total) under the tongue every 5 (five) minutes as needed for chest pain.  25 tablet  3  . potassium chloride SA (K-DUR,KLOR-CON) 20 MEQ tablet Take 1 tablet (20 mEq total) by mouth daily.  90 tablet  1   No current facility-administered medications on file prior to visit.   The PMH, PSH, Social History, Family History, Medications, and allergies have been reviewed in [facility], and have been updated if relevant.  Review of Systems    See HPI  No fevers No abdominal pain No  n/v/d Objective:    BP 138/82  Pulse 84  Temp(Src) 97.3 F (36.3 C) (Oral)  Ht 6' (1.829 m)  Wt 330 lb 8 oz (149.914 kg)  BMI 44.81 kg/m2  SpO2 97%   Physical Exam  Gen:  Alert, obese, NAD Skin:  No rash evident but pointing to area on right rib that could be a dermatomal distribution Abd: soft, NT Ext:  No edema      Assessment & Plan:   Urinary incontinence - Plan: Urinalysis Dipstick No Follow-up on file.

## 2013-03-15 ENCOUNTER — Telehealth: Payer: Self-pay

## 2013-03-15 LAB — URINE CULTURE
COLONY COUNT: NO GROWTH
Organism ID, Bacteria: NO GROWTH

## 2013-03-15 NOTE — Telephone Encounter (Signed)
pts wife said pt was asleep when took call about lab results. Patient's wife notified as instructed by 03/14/13 result note.

## 2013-05-18 ENCOUNTER — Emergency Department (HOSPITAL_BASED_OUTPATIENT_CLINIC_OR_DEPARTMENT_OTHER): Payer: Worker's Compensation

## 2013-05-18 ENCOUNTER — Encounter (HOSPITAL_BASED_OUTPATIENT_CLINIC_OR_DEPARTMENT_OTHER): Payer: Self-pay | Admitting: Emergency Medicine

## 2013-05-18 ENCOUNTER — Emergency Department (HOSPITAL_BASED_OUTPATIENT_CLINIC_OR_DEPARTMENT_OTHER)
Admission: EM | Admit: 2013-05-18 | Discharge: 2013-05-19 | Disposition: A | Discharge status: Home / Self Care | Payer: Worker's Compensation | Attending: Emergency Medicine | Admitting: Emergency Medicine

## 2013-05-18 DIAGNOSIS — Z79899 Other long term (current) drug therapy: Secondary | ICD-10-CM | POA: Insufficient documentation

## 2013-05-18 DIAGNOSIS — E785 Hyperlipidemia, unspecified: Secondary | ICD-10-CM | POA: Insufficient documentation

## 2013-05-18 DIAGNOSIS — X500XXA Overexertion from strenuous movement or load, initial encounter: Secondary | ICD-10-CM | POA: Insufficient documentation

## 2013-05-18 DIAGNOSIS — S50311A Abrasion of right elbow, initial encounter: Secondary | ICD-10-CM

## 2013-05-18 DIAGNOSIS — S93402A Sprain of unspecified ligament of left ankle, initial encounter: Secondary | ICD-10-CM

## 2013-05-18 DIAGNOSIS — E669 Obesity, unspecified: Secondary | ICD-10-CM | POA: Insufficient documentation

## 2013-05-18 DIAGNOSIS — Y9389 Activity, other specified: Secondary | ICD-10-CM | POA: Insufficient documentation

## 2013-05-18 DIAGNOSIS — S93409A Sprain of unspecified ligament of unspecified ankle, initial encounter: Secondary | ICD-10-CM | POA: Insufficient documentation

## 2013-05-18 DIAGNOSIS — I10 Essential (primary) hypertension: Secondary | ICD-10-CM | POA: Insufficient documentation

## 2013-05-18 DIAGNOSIS — Z9889 Other specified postprocedural states: Secondary | ICD-10-CM | POA: Insufficient documentation

## 2013-05-18 DIAGNOSIS — I252 Old myocardial infarction: Secondary | ICD-10-CM | POA: Insufficient documentation

## 2013-05-18 DIAGNOSIS — Z7982 Long term (current) use of aspirin: Secondary | ICD-10-CM | POA: Insufficient documentation

## 2013-05-18 DIAGNOSIS — Y990 Civilian activity done for income or pay: Secondary | ICD-10-CM | POA: Insufficient documentation

## 2013-05-18 DIAGNOSIS — Y9289 Other specified places as the place of occurrence of the external cause: Secondary | ICD-10-CM | POA: Insufficient documentation

## 2013-05-18 DIAGNOSIS — I2510 Atherosclerotic heart disease of native coronary artery without angina pectoris: Secondary | ICD-10-CM | POA: Insufficient documentation

## 2013-05-18 DIAGNOSIS — Z9089 Acquired absence of other organs: Secondary | ICD-10-CM | POA: Insufficient documentation

## 2013-05-18 DIAGNOSIS — IMO0002 Reserved for concepts with insufficient information to code with codable children: Secondary | ICD-10-CM | POA: Insufficient documentation

## 2013-05-18 NOTE — ED Provider Notes (Addendum)
CSN: [id number]     Arrival date & time 05/18/13  2146 History  This chart was scribed for No att. providers found by Sydell Axon, ED Scribe. This patient was seen in room MH11/MH11 and the patient's care was started at 10:34 PM.  HPI This is a 58 rolled male who was at work yesterday evening. Another employee accidentally knocked into him and he fell over. He twisted his left ankle as he fell. He also has an abrasion to his right elbow. He is having moderate pain in his left ankle when he ambulates. He is able to bear weight. There is swelling over the lateral aspect of the joint. There is no functional deficit or sensory deficit distally. He denies other injury.  Past Medical History  Diagnosis Date  . Myocardial infarction 09/2011  . Hypertension   . Hyperlipidemia   . Obesity   . Diabetes mellitus without complication     borderline  . Nonischemic cardiomyopathy     Previous ejection fraction of 35-40% on echo. 50% on cardiac catheterization in August of 2013  . Sleep apnea   . Coronary artery disease     Mild nonobstructive coronary artery disease on previous cardiac catheterization most recently in August of 2013   Past Surgical History  Procedure Laterality Date  . Colonoscopy    . Cardiac catheterization N/A 09/2011    [facility]; EF 50% with 30% mid LAD stenosis and no obstructive disease.  . Cardiac catheterization  09/2010    [facility]; Mid LAD 40% stenosis; Mid Circumflex:Normal; Mid RCA; Normal  . Knee arthroscopy with medial menisectomy Right 09/16/2012    Procedure: RIGHT KNEE ARTHROSCOPY WITH MEDIAL AND LATERAL MENISECTOMY, CHONDROPLASTY;  Surgeon: Ninetta Lights, MD;  Location: [place];  Service: Orthopedics;  Laterality: Right;  RIGHT KNEE SCOPE MEDIAL MENISCECTOMY   Family History  Problem Relation Age of Onset  . Heart disease Father 46    MI  . Heart attack Father   . Hypertension Mother    History  Substance Use Topics  . Smoking status: Never Smoker    . Smokeless tobacco: Not on file  . Alcohol Use: Yes     Comment: rare    Review of Systems A complete 10 system review of systems was obtained and all systems are negative except as noted in the HPI and PMH.   Allergies  Statins  Home Medications   Prior to Admission medications   Medication Sig Start Date End Date Taking? Authorizing Provider  aspirin 81 MG EC tablet Take 81 mg by mouth every evening. Swallow whole. 03/30/12   Lucille Passy, MD  citalopram (CELEXA) 20 MG tablet Take 1 tablet (20 mg total) by mouth daily. 01/13/13   Webb Silversmith, NP  furosemide (LASIX) 20 MG tablet Take 1 tablet (20 mg total) by mouth daily. 07/19/12   Lucille Passy, MD  gemfibrozil (LOPID) 600 MG tablet Take 1 tablet (600 mg total) by mouth 2 (two) times daily before a meal. 07/19/12   Lucille Passy, MD  glucose blood test strip Use as instructed 05/28/12   Lucille Passy, MD  lisinopril (PRINIVIL,ZESTRIL) 5 MG tablet Take 1 tablet (5 mg total) by mouth daily. 07/19/12   Lucille Passy, MD  metoprolol succinate (TOPROL-XL) 25 MG 24 hr tablet Take 1 tablet (25 mg total) by mouth daily. 04/15/12   Lucille Passy, MD  nitroGLYCERIN (NITROSTAT) 0.4 MG SL tablet Place 1 tablet (0.4 mg total) under  the tongue every 5 (five) minutes as needed for chest pain. 06/04/12   Rogelia Mire, NP  potassium chloride SA (K-DUR,KLOR-CON) 20 MEQ tablet Take 1 tablet (20 mEq total) by mouth daily. 07/19/12   Lucille Passy, MD   Triage Vitals: BP 162/86  Pulse 80  Temp(Src) 98.2 F (36.8 C)  Resp 20  Ht 6\' 3"  (1.905 m)  Wt 325 lb (147.419 kg)  BMI 40.62 kg/m2  SpO2 98%  Physical Exam  Nursing note and vitals reviewed. General: Well-developed, well-nourished male in no acute distress; appearance consistent with age of record HENT: normocephalic; atraumatic Eyes: pupils equal, round and reactive to light; extraocular muscles intact Neck: supple; nontender Heart: regular rate and rhythm Lungs: clear to auscultation  bilaterally Abdomen: soft; nondistended; nontender Extremities: No deformity; full range of motion; pulses normal; trace edema of lower legs; tenderness and swelling over left lateral malleolus; left ankle stable; left foot distally neurovascularly intact Neurologic: Awake, alert and oriented; motor function intact in all extremities and symmetric; no facial droop Skin: Warm and dry; superficial abrasion of right elbow Psychiatric: Normal mood and affect  ED Course  Procedures (including critical care time)  Nursing notes and vitals signs, including pulse oximetry, reviewed.  Summary of this visit's results, reviewed by myself:  Labs:  No results found for this or any previous visit (from the past 24 hour(s)).  Imaging Studies: Dg Ankle Complete Left  06/10/2013   CLINICAL DATA:  Left ankle injury, pain  EXAM: LEFT ANKLE COMPLETE - 3+ VIEW  COMPARISON:  None.  FINDINGS: There is no evidence of fracture, dislocation, or joint effusion. There is a small plantar calcaneal spur. There is no evidence of arthropathy or other focal bone abnormality. Soft tissues are unremarkable.  IMPRESSION: No acute osseous injury of the left ankle.   Electronically Signed   By: Kathreen Devoid   On: 06-10-2013 23:14        Wynetta Fines, MD 05/19/13 0022  Wynetta Fines, MD 05/19/13 [id number]

## 2013-05-18 NOTE — ED Notes (Signed)
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

## 2013-05-19 NOTE — Discharge Instructions (Signed)

## 2013-05-22 ENCOUNTER — Observation Stay: Payer: Self-pay | Admitting: Internal Medicine

## 2013-05-22 DIAGNOSIS — R079 Chest pain, unspecified: Secondary | ICD-10-CM

## 2013-05-22 LAB — BASIC METABOLIC PANEL WITH GFR
Anion Gap: 7
BUN: 9 mg/dL
Calcium, Total: 8.8 mg/dL
Chloride: 102 mmol/L
Co2: 27 mmol/L
Creatinine: 1.05 mg/dL
EGFR (African American): >60
EGFR (Non-African Amer.): >60
Glucose: 160 mg/dL — ABNORMAL HIGH
Osmolality: 274
Potassium: 3.5 mmol/L
Sodium: 136 mmol/L

## 2013-05-22 LAB — CBC
HCT: 42.3 %
HGB: 14.1 g/dL
MCH: 29 pg
MCHC: 33.2 g/dL
MCV: 87 fL
Platelet: 245 x10 3/mm 3
RBC: 4.84 x10 6/mm 3
RDW: 14.3 %
WBC: 8.8 x10 3/mm 3

## 2013-05-22 LAB — CK-MB
CK-MB: 1 ng/mL (ref 0.5–3.6)
CK-MB: 0.9 ng/mL (ref 0.5–3.6)

## 2013-05-22 LAB — TROPONIN I
Troponin-I: <0.02 ng/mL
Troponin-I: <0.02 ng/mL

## 2013-05-22 LAB — CK TOTAL AND CKMB (NOT AT ARMC)
CK, Total: 135 U/L
CK-MB: 1.2 ng/mL (ref 0.5–3.6)

## 2013-05-22 LAB — PRO B NATRIURETIC PEPTIDE: B-TYPE NATIURETIC PEPTID: 128 pg/mL — AB (ref 0–125)

## 2013-05-23 LAB — CBC WITH DIFFERENTIAL/PLATELET
BASOS ABS: 0.1 10*3/uL (ref 0.0–0.1)
BASOS PCT: 0.7 %
EOS PCT: 2.5 %
Eosinophil #: 0.2 10*3/uL (ref 0.0–0.7)
HCT: 40.9 % (ref 40.0–52.0)
HGB: 13.5 g/dL (ref 13.0–18.0)
LYMPHS ABS: 2.7 10*3/uL (ref 1.0–3.6)
Lymphocyte %: 34.7 %
MCH: 29.1 pg (ref 26.0–34.0)
MCHC: 33 g/dL (ref 32.0–36.0)
MCV: 88 fL (ref 80–100)
Monocyte #: 0.7 x10 3/mm (ref 0.2–1.0)
Monocyte %: 8.7 %
Neutrophil #: 4.1 10*3/uL (ref 1.4–6.5)
Neutrophil %: 53.4 %
Platelet: 219 10*3/uL (ref 150–440)
RBC: 4.63 10*6/uL (ref 4.40–5.90)
RDW: 14.4 % (ref 11.5–14.5)
WBC: 7.7 10*3/uL (ref 3.8–10.6)

## 2013-05-23 LAB — BASIC METABOLIC PANEL
Anion Gap: 4 — ABNORMAL LOW (ref 7–16)
BUN: 12 mg/dL (ref 7–18)
CALCIUM: 8.6 mg/dL (ref 8.5–10.1)
Chloride: 103 mmol/L (ref 98–107)
Co2: 29 mmol/L (ref 21–32)
Creatinine: 0.96 mg/dL (ref 0.60–1.30)
EGFR (African American): >60
EGFR (Non-African Amer.): >60
Glucose: 122 mg/dL — ABNORMAL HIGH (ref 65–99)
Osmolality: 273 (ref 275–301)
Potassium: 4 mmol/L (ref 3.5–5.1)
SODIUM: 136 mmol/L (ref 136–145)

## 2013-05-23 LAB — MAGNESIUM: Magnesium: 2 mg/dL

## 2013-05-24 ENCOUNTER — Encounter: Payer: Self-pay | Admitting: *Deleted

## 2013-05-26 ENCOUNTER — Telehealth: Payer: Self-pay

## 2013-05-26 NOTE — Telephone Encounter (Signed)
Attempted to contact pt regarding discharge from [facility] on 05/22/13.  Left detailed message for pt to call w/ any questions about discharge instructions or medications.

## 2013-05-27 ENCOUNTER — Ambulatory Visit: Payer: Self-pay | Admitting: Internal Medicine

## 2013-05-27 ENCOUNTER — Telehealth: Payer: Self-pay | Admitting: Family Medicine

## 2013-05-27 NOTE — Telephone Encounter (Signed)
Patient Information:  Caller Name: Lattie Haw  Phone: [phone]  Patient: Gerald [name], Gerald [name]  Gender: Male  DOB: [birth year]  Age: 48 Years  PCP: Arnette Norris [facility])  Office Follow Up:  Does the office need to follow up with this patient?: No  Instructions For The Office: N/A   Symptoms  Reason For Call & Symptoms: Spouse/ Lattie Haw calling about Pain and pressure in left eye upon waking 05/27/13.  Nausea is associated with this.  Eye pain rated at  5 of 10.  Emergent symptoms ruled out.  Go to Office Now per Eye Pain guideline due to Eye pain/discomfort and more than mild.  Reviewed Health History In EMR: Yes  Reviewed Medications In EMR: Yes  Reviewed Allergies In EMR: Yes  Reviewed Surgeries / Procedures: Yes  Date of Onset of Symptoms: 05/27/2013  Treatments Tried: Ranitidine - decreased nausea  Treatments Tried Worked: Yes  Guideline(s) Used:  Eye Pain  Disposition Per Guideline:   Go to Office Now  Reason For Disposition Reached:   Eye pain/discomfort and more than mild  Advice Given:  Avoid Rubbing:   Rubbing your eyes irritates them more. Rubbing can also cause a corneal scratch if a small particle is present. It can also make an existing corneal scratch worse.  Cool [employer] Cloth:   Apply a cool wet cloth to the closed eyes for 10 minutes as needed.  Call Back If:  Pain increases  Pus or yellow/green discharge occurs  Blurred vision occurs  You become worse.  Patient Will Follow Care Advice:  YES  Appointment Scheduled:  05/27/2013 10:30:00 Appointment Scheduled Provider:  Webb Silversmith

## 2013-05-31 ENCOUNTER — Encounter: Payer: 59 | Admitting: Cardiovascular Disease

## 2013-09-05 ENCOUNTER — Telehealth: Payer: Self-pay

## 2013-09-05 NOTE — Telephone Encounter (Signed)
Pt left v/m requesting refill of med; did not specify name of med needed. Left v/m requesting cb. Pt last seen 03/14/13 and last CPX 05/28/12. Pt has already scheduled CPX on 11/08/13.

## 2013-09-06 MED ORDER — METOPROLOL SUCCINATE ER 25 MG PO TB24: 25.0000 mg | ORAL_TABLET | Freq: Every day | ORAL | Status: DC

## 2013-09-06 NOTE — Telephone Encounter (Signed)
Pt left message needing refill metoprolol to [facility]; spoke with pt s wife and advised refill done and get refills updated at next appt.

## 2013-09-08 ENCOUNTER — Encounter: Payer: Self-pay | Admitting: Family Medicine

## 2013-11-08 ENCOUNTER — Ambulatory Visit (INDEPENDENT_AMBULATORY_CARE_PROVIDER_SITE_OTHER): Payer: 59 | Admitting: Family Medicine

## 2013-11-08 ENCOUNTER — Encounter: Payer: Self-pay | Admitting: Family Medicine

## 2013-11-08 VITALS — BP 132/74 | HR 79 | Temp 97.8°F | Ht 72.75 in | Wt 337.8 lb

## 2013-11-08 DIAGNOSIS — E8770 Fluid overload, unspecified: Secondary | ICD-10-CM

## 2013-11-08 DIAGNOSIS — E118 Type 2 diabetes mellitus with unspecified complications: Secondary | ICD-10-CM

## 2013-11-08 DIAGNOSIS — Z125 Encounter for screening for malignant neoplasm of prostate: Secondary | ICD-10-CM

## 2013-11-08 DIAGNOSIS — Z0000 Encounter for general adult medical examination without abnormal findings: Secondary | ICD-10-CM | POA: Insufficient documentation

## 2013-11-08 DIAGNOSIS — F32A Depression, unspecified: Secondary | ICD-10-CM

## 2013-11-08 DIAGNOSIS — F329 Major depressive disorder, single episode, unspecified: Secondary | ICD-10-CM

## 2013-11-08 DIAGNOSIS — R609 Edema, unspecified: Secondary | ICD-10-CM

## 2013-11-08 DIAGNOSIS — I25119 Atherosclerotic heart disease of native coronary artery with unspecified angina pectoris: Secondary | ICD-10-CM

## 2013-11-08 DIAGNOSIS — G894 Chronic pain syndrome: Secondary | ICD-10-CM

## 2013-11-08 DIAGNOSIS — E785 Hyperlipidemia, unspecified: Secondary | ICD-10-CM

## 2013-11-08 DIAGNOSIS — Z0189 Encounter for other specified special examinations: Secondary | ICD-10-CM

## 2013-11-08 LAB — MICROALBUMIN / CREATININE URINE RATIO
Creatinine,U: 304.5 mg/dL
MICROALB UR: 3 mg/dL — AB (ref 0.0–1.9)
Microalb Creat Ratio: 1 mg/g (ref 0.0–30.0)

## 2013-11-08 LAB — CBC WITH DIFFERENTIAL/PLATELET
BASOS ABS: 0.1 10*3/uL (ref 0.0–0.1)
Basophils Relative: 0.6 % (ref 0.0–3.0)
EOS ABS: 0.1 10*3/uL (ref 0.0–0.7)
Eosinophils Relative: 1.2 % (ref 0.0–5.0)
HCT: 44.2 % (ref 39.0–52.0)
Hemoglobin: 14.7 g/dL (ref 13.0–17.0)
Lymphocytes Relative: 36.2 % (ref 12.0–46.0)
Lymphs Abs: 3.5 10*3/uL (ref 0.7–4.0)
MCHC: 33.3 g/dL (ref 30.0–36.0)
MCV: 88.3 fl (ref 78.0–100.0)
MONO ABS: 0.8 10*3/uL (ref 0.1–1.0)
Monocytes Relative: 8.3 % (ref 3.0–12.0)
NEUTROS PCT: 53.7 % (ref 43.0–77.0)
Neutro Abs: 5.2 10*3/uL (ref 1.4–7.7)
PLATELETS: 244 10*3/uL (ref 150.0–400.0)
RBC: 5.01 Mil/uL (ref 4.22–5.81)
RDW: 14.8 % (ref 11.5–15.5)
WBC: 9.7 10*3/uL (ref 4.0–10.5)

## 2013-11-08 LAB — HEMOGLOBIN A1C: Hgb A1c MFr Bld: 7.5 % — ABNORMAL HIGH (ref 4.6–6.5)

## 2013-11-08 LAB — BRAIN NATRIURETIC PEPTIDE: PRO B NATRI PEPTIDE: 19 pg/mL (ref 0.0–100.0)

## 2013-11-08 MED ORDER — CITALOPRAM HYDROBROMIDE 20 MG PO TABS: 20.0000 mg | ORAL_TABLET | Freq: Every day | ORAL | Status: DC

## 2013-11-08 MED ORDER — GEMFIBROZIL 600 MG PO TABS: 600.0000 mg | ORAL_TABLET | Freq: Two times a day (BID) | ORAL | Status: DC

## 2013-11-08 MED ORDER — NITROGLYCERIN 0.4 MG SL SUBL: 0.4000 mg | SUBLINGUAL_TABLET | SUBLINGUAL | Status: DC | PRN

## 2013-11-08 MED ORDER — METOPROLOL SUCCINATE ER 25 MG PO TB24: 25.0000 mg | ORAL_TABLET | Freq: Every day | ORAL | Status: DC

## 2013-11-08 MED ORDER — FUROSEMIDE 20 MG PO TABS: 20.0000 mg | ORAL_TABLET | Freq: Every day | ORAL | Status: DC

## 2013-11-08 MED ORDER — POTASSIUM CHLORIDE CRYS ER 20 MEQ PO TBCR: 20.0000 meq | EXTENDED_RELEASE_TABLET | Freq: Every day | ORAL | Status: DC

## 2013-11-08 MED ORDER — LISINOPRIL 5 MG PO TABS: 5.0000 mg | ORAL_TABLET | Freq: Every day | ORAL | Status: DC

## 2013-11-08 NOTE — Assessment & Plan Note (Signed)
Asymptomatic. Continue ASA, betablocker. Statin intolerant.

## 2013-11-08 NOTE — Progress Notes (Signed)
Pre visit review using our clinic review tool, if applicable. No additional management support is needed unless otherwise documented below in the visit note. 

## 2013-11-08 NOTE — Patient Instructions (Signed)
Great to see you. I will call you with your lab and urine results from today.

## 2013-11-08 NOTE — Assessment & Plan Note (Signed)
New- admits to being more sedentary and not watching his diet. Abdomen a little distended today. Will start work up with labs, may need abdomen US. The patient indicates understanding of these issues and agrees with the plan.

## 2013-11-08 NOTE — Assessment & Plan Note (Signed)
Statin intolerant.  On lopid. Check lipid panel today.

## 2013-11-08 NOTE — Progress Notes (Signed)
Subjective:    Patient ID: Gerald [name], male    DOB: Jun 29, 1965, 48 y.o.   MRN: [id number]  HPI 48 yo male with h/o DM, CAD and HTN, here for CPX.  No family history of colon CA or prostate CA.  HTN- BP improved since we started metoprolol 25 mg daily, almost at goal for diabetic. Denies any HA or blurred vision and states that he feels his BP is better controlled.  CAD-  Statin intolerant.  He is taking ASA and beta blocker.   Followed by Dr. Fletcher Anon. Last seen in 09/14/12 by Danella Sensing.  Note reviewed.  Also has a pos FH of CAD- dad had MI in his 48s.   No recent CP or DOE.   Non smoker.  Lab Results  Component Value Date   CHOL 190 05/20/2012   HDL 28.70* 05/20/2012   LDLCALC 132* 05/20/2012   LDLDIRECT 132.1 03/30/2012   TRIG 145.0 05/20/2012   CHOLHDL 7 05/20/2012     DM-   Lab Results  Component Value Date   HGBA1C 6.5 03/14/2013    Was supposed to be taking Metformin 500 mg twice daily.  Has not been taking because he was supposed to stop it. Has not been checking his FSBS. Denies episodes of hypoglycemia.  He does feel like he is "retaining fluid," especially in his abdomen. Taking Lasix 20 mg daily.  Wants to know if he can take more. Denies CP or SOB. Denies LE edema.  Lab Results  Component Value Date   CREATININE 1.0 03/14/2013     Depression- saw Webb Silversmith in 12/14 for worsening symptoms of depression given increased life stressors with wife's chronic medical conditions.  Was started on Celexa 20 mg daily.  Has felt like celexa has helped him to relax and "rest."  Has not noticed any side effects. Patient Active Problem List   Diagnosis Date Noted  . Encounter for wellness examination 11/08/2013  . Urinary incontinence 03/14/2013  . Rash and nonspecific skin eruption 03/14/2013  . Depression 01/13/2013  . Type 2 diabetes mellitus 09/14/2012  . Nonischemic cardiomyopathy   . HLD (hyperlipidemia) 05/28/2012  . CAD (coronary artery disease)  03/30/2012  . Chronic pain syndrome 03/30/2012  . HTN (hypertension) 03/30/2012   Past Medical History  Diagnosis Date  . Myocardial infarction 09/2011  . Hypertension   . Hyperlipidemia   . Obesity   . Diabetes mellitus without complication     borderline  . Nonischemic cardiomyopathy     Previous ejection fraction of 35-40% on echo. 50% on cardiac catheterization in August of 2013  . Sleep apnea   . Coronary artery disease     Mild nonobstructive coronary artery disease on previous cardiac catheterization most recently in August of 2013  . Gastritis    Past Surgical History  Procedure Laterality Date  . Colonoscopy    . Cardiac catheterization N/A 09/2011    [facility]; EF 50% with 30% mid LAD stenosis and no obstructive disease.  . Cardiac catheterization  09/2010    [facility]; Mid LAD 40% stenosis; Mid Circumflex:Normal; Mid RCA; Normal  . Knee arthroscopy with medial menisectomy Right 09/16/2012    Procedure: RIGHT KNEE ARTHROSCOPY WITH MEDIAL AND LATERAL MENISECTOMY, CHONDROPLASTY;  Surgeon: Ninetta Lights, MD;  Location: [place];  Service: Orthopedics;  Laterality: Right;  RIGHT KNEE SCOPE MEDIAL MENISCECTOMY   History  Substance Use Topics  . Smoking status: Never Smoker   . Smokeless tobacco: Not on file  .  Alcohol Use: Yes     Comment: rare   Family History  Problem Relation Age of Onset  . Heart disease Father 80    MI  . Heart attack Father   . Hypertension Mother    Allergies  Allergen Reactions  . Statins Rash        Current Outpatient Prescriptions on File Prior to Visit  Medication Sig Dispense Refill  . aspirin 81 MG EC tablet Take 81 mg by mouth every evening. Swallow whole.      . citalopram (CELEXA) 20 MG tablet Take 1 tablet (20 mg total) by mouth daily.  30 tablet  3  . furosemide (LASIX) 20 MG tablet Take 1 tablet (20 mg total) by mouth daily.  90 tablet  1  . gemfibrozil (LOPID) 600 MG tablet Take 1 tablet (600 mg total) by mouth 2  (two) times daily before a meal.  180 tablet  1  . glucose blood test strip Use as instructed  100 each  12  . lisinopril (PRINIVIL,ZESTRIL) 5 MG tablet Take 1 tablet (5 mg total) by mouth daily.  90 tablet  1  . metoprolol succinate (TOPROL-XL) 25 MG 24 hr tablet Take 1 tablet (25 mg total) by mouth daily.  90 tablet  0  . nitroGLYCERIN (NITROSTAT) 0.4 MG SL tablet Place 1 tablet (0.4 mg total) under the tongue every 5 (five) minutes as needed for chest pain.  25 tablet  3  . potassium chloride SA (K-DUR,KLOR-CON) 20 MEQ tablet Take 1 tablet (20 mEq total) by mouth daily.  90 tablet  1   No current facility-administered medications on file prior to visit.   The PMH, PSH, Social History, Family History, Medications, and allergies have been reviewed in [facility], and have been updated if relevant.      Review of Systems See HPI Patient reports no  vision/ hearing changes,anorexia, weight change, fever ,adenopathy, persistant / recurrent hoarseness, swallowing issues, chest pain, edema,persistant / recurrent cough, hemoptysis, dyspnea(rest, exertional, paroxysmal nocturnal), gastrointestinal  bleeding (melena, rectal bleeding), abdominal pain, excessive heart burn, GU symptoms(dysuria, hematuria, pyuria, voiding/incontinence  Issues) syncope, focal weakness, severe memory loss, concerning skin lesions, depression, anxiety, abnormal bruising/bleeding, major joint swelling.       Objective:   Physical Exam  BP 132/74  Pulse 79  Temp(Src) 97.8 F (36.6 C) (Oral)  Ht 6' 0.75" (1.848 m)  Wt 337 lb 12 oz (153.202 kg)  BMI 44.86 kg/m2  SpO2 98% Wt Readings from Last 3 Encounters:  11/08/13 337 lb 12 oz (153.202 kg)  05/18/13 325 lb (147.419 kg)  03/14/13 330 lb 8 oz (149.914 kg)     General:  overweght male in NAD Eyes:  PERRL Ears:  External ear exam shows no significant lesions or deformities.  Otoscopic examination reveals clear canals, tympanic membranes are intact bilaterally without  bulging, retraction, inflammation or discharge. Hearing is grossly normal bilaterally. Nose:  External nasal examination shows no deformity or inflammation. Nasal mucosa are pink and moist without lesions or exudates. Mouth:  Oral mucosa and oropharynx without lesions or exudates.  Teeth in good repair. Neck:  no carotid bruit or thyromegaly no cervical or supraclavicular lymphadenopathy  Lungs:  Normal respiratory effort, chest expands symmetrically. Lungs are clear to auscultation, no crackles or wheezes. Heart:  Normal rate and regular rhythm. S1 and S2 normal without gallop, murmur, click, rub or other extra sounds. Abdomen:  Bowel sounds positive, abdomen does appear a little more distended with some mild epigastric  tenderness, no rebound or guarding Pulses:  R and L posterior tibial pulses are full and equal bilaterally  Extremities:  no edema     Assessment & Plan:

## 2013-11-08 NOTE — Assessment & Plan Note (Signed)
Improved on current dose of Celexa. eRx refills sent.

## 2013-11-08 NOTE — Assessment & Plan Note (Signed)
Recheck a1c. Has not been taking metformin. Check urine micro as well. On ACEI.

## 2013-11-08 NOTE — Assessment & Plan Note (Signed)
Reviewed preventive care protocols, scheduled due services, and updated immunizations Discussed nutrition, exercise, diet, and healthy lifestyle.  Orders Placed This Encounter  Procedures  . CBC with Differential  . Comprehensive metabolic panel  . Lipid panel  . PSA  . Lipase  . Microalbumin / creatinine urine ratio  . Brain natriuretic peptide

## 2013-11-08 NOTE — Addendum Note (Signed)
Addended by: Modena Nunnery on: 11/08/2013 09:06 AM   Modules accepted: Orders

## 2013-11-09 LAB — COMPREHENSIVE METABOLIC PANEL
ALBUMIN: 3.9 g/dL (ref 3.5–5.2)
ALK PHOS: 82 U/L (ref 39–117)
ALT: 83 U/L — ABNORMAL HIGH (ref 0–53)
AST: 46 U/L — AB (ref 0–37)
BUN: 17 mg/dL (ref 6–23)
CO2: 27 mEq/L (ref 19–32)
Calcium: 9.1 mg/dL (ref 8.4–10.5)
Chloride: 102 mEq/L (ref 96–112)
Creatinine, Ser: 1 mg/dL (ref 0.4–1.5)
GFR: 82.26 mL/min (ref >60.00)
Glucose, Bld: 150 mg/dL — ABNORMAL HIGH (ref 70–99)
POTASSIUM: 4.5 meq/L (ref 3.5–5.1)
SODIUM: 139 meq/L (ref 135–145)
TOTAL PROTEIN: 7.5 g/dL (ref 6.0–8.3)
Total Bilirubin: 0.7 mg/dL (ref 0.2–1.2)

## 2013-11-09 LAB — PSA: PSA: 2.21 ng/mL (ref 0.10–4.00)

## 2013-11-09 LAB — LIPID PANEL
CHOL/HDL RATIO: 7
Cholesterol: 212 mg/dL — ABNORMAL HIGH (ref 0–200)
HDL: 32.4 mg/dL — ABNORMAL LOW (ref >39.00)
NonHDL: 179.6
Triglycerides: 201 mg/dL — ABNORMAL HIGH (ref 0.0–149.0)
VLDL: 40.2 mg/dL — ABNORMAL HIGH (ref 0.0–40.0)

## 2013-11-09 LAB — LIPASE: LIPASE: 19 U/L (ref 11.0–59.0)

## 2013-11-09 LAB — LDL CHOLESTEROL, DIRECT: Direct LDL: 145.9 mg/dL

## 2013-11-18 ENCOUNTER — Telehealth: Payer: Self-pay

## 2013-11-18 NOTE — Telephone Encounter (Signed)
Spoke to [place] and advised him that the pt has been on K+ and received it from [facility]. I advised him to attach to the pts file, that whenever pt receives K+ Rx, to fill has he has been previously receiving, which is showing to be KDur. Brad apologized and states that there should not have been any discrepancy regarding refill, and that [facility] did not view pts refill Hx before filling. Leroy Sea has noted the pts account to avoid future delays

## 2013-11-18 NOTE — Telephone Encounter (Signed)
Gerald [name] with [facility] left v/m on 11/08/13 refill for potassium was [facility] needs clarification which brand is to be used; if K Dur or Kloricon is being ordered. Ref # [id number].Gerald [name] request cb.

## 2013-11-22 ENCOUNTER — Telehealth: Payer: Self-pay | Admitting: Family Medicine

## 2013-11-22 ENCOUNTER — Emergency Department: Payer: Self-pay | Admitting: Emergency Medicine

## 2013-11-22 LAB — COMPREHENSIVE METABOLIC PANEL
ALBUMIN: 3.6 g/dL (ref 3.4–5.0)
ALK PHOS: 114 U/L
AST: 65 U/L — AB (ref 15–37)
Anion Gap: 8 (ref 7–16)
BILIRUBIN TOTAL: 0.4 mg/dL (ref 0.2–1.0)
BUN: 9 mg/dL (ref 7–18)
CO2: 27 mmol/L (ref 21–32)
Calcium, Total: 8.8 mg/dL (ref 8.5–10.1)
Chloride: 104 mmol/L (ref 98–107)
Creatinine: 0.91 mg/dL (ref 0.60–1.30)
EGFR (African American): >60
EGFR (Non-African Amer.): >60
Glucose: 129 mg/dL — ABNORMAL HIGH (ref 65–99)
OSMOLALITY: 278 (ref 275–301)
Potassium: 4 mmol/L (ref 3.5–5.1)
SGPT (ALT): 114 U/L — ABNORMAL HIGH
Sodium: 139 mmol/L (ref 136–145)
Total Protein: 7.6 g/dL (ref 6.4–8.2)

## 2013-11-22 LAB — CBC
HCT: 45.5 % (ref 40.0–52.0)
HGB: 14.8 g/dL (ref 13.0–18.0)
MCH: 28.7 pg (ref 26.0–34.0)
MCHC: 32.6 g/dL (ref 32.0–36.0)
MCV: 88 fL (ref 80–100)
PLATELETS: 232 10*3/uL (ref 150–440)
RBC: 5.17 10*6/uL (ref 4.40–5.90)
RDW: 14.8 % — AB (ref 11.5–14.5)
WBC: 8 10*3/uL (ref 3.8–10.6)

## 2013-11-22 LAB — URINALYSIS, COMPLETE
BILIRUBIN, UR: NEGATIVE
Bacteria: NONE SEEN
Blood: NEGATIVE
Glucose,UR: NEGATIVE mg/dL (ref 0–75)
Ketone: NEGATIVE
Leukocyte Esterase: NEGATIVE
Nitrite: NEGATIVE
PH: 5 (ref 4.5–8.0)
Protein: NEGATIVE
RBC,UR: 2 /HPF (ref 0–5)
SPECIFIC GRAVITY: 1.02 (ref 1.003–1.030)
Squamous Epithelial: 1
WBC UR: 4 /HPF (ref 0–5)

## 2013-11-22 NOTE — Telephone Encounter (Signed)
Patient Information:  Caller Name: [place]  Phone: [phone]  Patient: Gerald [name], Gerald [name]  Gender: Male  DOB: January 25, 1966  Age: 48 Years  PCP: Arnette Norris [facility])  Office Follow Up:  Does the office need to follow up with this patient?: Yes  Instructions For The Office: Please call and confirm if you want the pt to go to ED. (needs confirmation from office on daytime hours).   Symptoms  Reason For Call & Symptoms: Pt has low back pain. Onset this afternoon. No trauma prior to this. No fever. Pt has urinated and it was urgent. No fever.  Reviewed Health History In EMR: Yes  Reviewed Medications In EMR: Yes  Reviewed Allergies In EMR: Yes  Reviewed Surgeries / Procedures: Yes  Date of Onset of Symptoms: 11/22/2013  Guideline(s) Used:  Back Pain  Disposition Per Guideline:   Go to ED Now (or to Office with PCP Approval)  Reason For Disposition Reached:   Vomiting and pain over lower ribs of back (i.e., flank - kidney area)  Advice Given:  N/A  Patient Will Follow Care Advice:  YES

## 2013-11-22 NOTE — Telephone Encounter (Signed)
Agree with ER.

## 2013-11-22 NOTE — Telephone Encounter (Signed)
Spoke with patient and advised results   

## 2013-11-23 ENCOUNTER — Encounter: Payer: Self-pay | Admitting: Family Medicine

## 2013-11-23 ENCOUNTER — Ambulatory Visit (INDEPENDENT_AMBULATORY_CARE_PROVIDER_SITE_OTHER): Payer: 59 | Admitting: Family Medicine

## 2013-11-23 VITALS — BP 144/90 | HR 76 | Temp 97.7°F | Wt 342.0 lb

## 2013-11-23 DIAGNOSIS — R300 Dysuria: Secondary | ICD-10-CM | POA: Insufficient documentation

## 2013-11-23 DIAGNOSIS — R109 Unspecified abdominal pain: Secondary | ICD-10-CM | POA: Insufficient documentation

## 2013-11-23 LAB — POCT URINALYSIS DIPSTICK
Bilirubin, UA: NEGATIVE
Blood, UA: NEGATIVE
Glucose, UA: NEGATIVE
Ketones, UA: NEGATIVE
LEUKOCYTES UA: NEGATIVE
NITRITE UA: NEGATIVE
PROTEIN UA: NEGATIVE
Spec Grav, UA: 1.025
Urobilinogen, UA: NEGATIVE
pH, UA: 6

## 2013-11-23 MED ORDER — TAMSULOSIN HCL 0.4 MG PO CAPS: 0.4000 mg | ORAL_CAPSULE | Freq: Every day | ORAL | Status: DC

## 2013-11-23 NOTE — Progress Notes (Signed)
Pre visit review using our clinic review tool, if applicable. No additional management support is needed unless otherwise documented below in the visit note. 

## 2013-11-23 NOTE — Progress Notes (Signed)
Subjective:    Patient ID: Gerald [name], male    DOB: 17-Nov-1965, 48 y.o.   MRN: [id number]  HPI  Pleasant 48 yo male with h/o DM, CAD and HTN here for ER follow up.  Notes reviewed- went to [facility] yesterday for several hours of severe right flank pain.  On exam in ER- noted to have severe right CVA tenderness.  UA neg for all- including blood, LE and nitrites.  CBC unremarkable.  CT abd/pelvis without contrast- no urologic calculus or obstructive uropathy.  Normal appendix.  CT angiography of abdomen then performed- Normal CTA of abdomen. No evidence of renal vein thrombosis. Hepatic steatosis noted. Chronic mesenteric edema without ascites of focal inflammatory process.  Told pt he likely passed a stone- given rx for percocet and advised to follow up with me here today.  No h/o kidney stones.  His pain today is 8/10 instead of "10+/10" yesterday.  Intermittent.  Mainly right groin now- was more in right back before.  +nausea, no vomiting.  He thinks he passed stone this am- did not bring it with him today.  Current Outpatient Prescriptions on File Prior to Visit  Medication Sig Dispense Refill  . aspirin 81 MG EC tablet Take 81 mg by mouth every evening. Swallow whole.      . citalopram (CELEXA) 20 MG tablet Take 1 tablet (20 mg total) by mouth daily.  90 tablet  0  . furosemide (LASIX) 20 MG tablet Take 1 tablet (20 mg total) by mouth daily.  90 tablet  1  . gemfibrozil (LOPID) 600 MG tablet Take 1 tablet (600 mg total) by mouth 2 (two) times daily before a meal.  180 tablet  1  . glucose blood test strip Use as instructed  100 each  12  . lisinopril (PRINIVIL,ZESTRIL) 5 MG tablet Take 1 tablet (5 mg total) by mouth daily.  90 tablet  1  . metoprolol succinate (TOPROL-XL) 25 MG 24 hr tablet Take 1 tablet (25 mg total) by mouth daily.  90 tablet  1  . nitroGLYCERIN (NITROSTAT) 0.4 MG SL tablet Place 1 tablet (0.4 mg total) under the tongue every 5 (five) minutes as  needed for chest pain.  75 tablet  1  . potassium chloride SA (K-DUR,KLOR-CON) 20 MEQ tablet Take 1 tablet (20 mEq total) by mouth daily.  90 tablet  1   No current facility-administered medications on file prior to visit.    Allergies  Allergen Reactions  . Statins Rash         Past Medical History  Diagnosis Date  . Myocardial infarction 09/2011  . Hypertension   . Hyperlipidemia   . Obesity   . Diabetes mellitus without complication     borderline  . Nonischemic cardiomyopathy     Previous ejection fraction of 35-40% on echo. 50% on cardiac catheterization in August of 2013  . Sleep apnea   . Coronary artery disease     Mild nonobstructive coronary artery disease on previous cardiac catheterization most recently in August of 2013  . Gastritis     Past Surgical History  Procedure Laterality Date  . Colonoscopy    . Cardiac catheterization N/A 09/2011    [facility]; EF 50% with 30% mid LAD stenosis and no obstructive disease.  . Cardiac catheterization  09/2010    [facility]; Mid LAD 40% stenosis; Mid Circumflex:Normal; Mid RCA; Normal  . Knee arthroscopy with medial menisectomy Right 09/16/2012    Procedure: RIGHT KNEE ARTHROSCOPY WITH  MEDIAL AND LATERAL MENISECTOMY, CHONDROPLASTY;  Surgeon: Ninetta Lights, MD;  Location: [place];  Service: Orthopedics;  Laterality: Right;  RIGHT KNEE SCOPE MEDIAL MENISCECTOMY    Family History  Problem Relation Age of Onset  . Heart disease Father 65    MI  . Heart attack Father   . Hypertension Mother     History   Social History  . Marital Status: Single    Spouse Name: N/A    Number of Children: N/A  . Years of Education: N/A   Occupational History  . Not on file.   Social History Main Topics  . Smoking status: Never Smoker   . Smokeless tobacco: Not on file  . Alcohol Use: Yes     Comment: rare  . Drug Use: No  . Sexual Activity: Not on file   Other Topics Concern  . Not on file   Social History  Narrative   Married.  28 yo son.   Wife on disability for bipolar disorder.     The PMH, PSH, Social History, Family History, Medications, and allergies have been reviewed in [facility], and have been updated if relevant.   Review of Systems  Constitutional: Positive for fatigue. Negative for fever.  Gastrointestinal: Positive for nausea and abdominal pain. Negative for vomiting and diarrhea.  Genitourinary: Positive for dysuria, flank pain and penile pain. Negative for difficulty urinating.  All other systems reviewed and are negative.     See HPI Objective:   Physical Exam  Constitutional: He is oriented to person, place, and time. He appears well-developed.  Appears uncomfortable  HENT:  Head: Normocephalic.  Abdominal: Soft. There is no rebound and no guarding.  Limited by body habitus  Neurological: He is alert and oriented to person, place, and time.  Skin: Skin is warm and dry.  Psychiatric: He has a normal mood and affect. His behavior is normal. Judgment and thought content normal.   BP 144/90  Pulse 76  Temp(Src) 97.7 F (36.5 C) (Oral)  Wt 342 lb (155.13 kg)  SpO2 97%        Assessment & Plan:

## 2013-11-23 NOTE — Assessment & Plan Note (Signed)
New- etiology remains unclear to me because UA, CT and CTA were all neg in the ER. UA here today neg for all components, including RBCs as well. He is clearly complaining of urinary symptoms- I suppose he could have passed a stone but I would expect to see blood, at least microscopically, in his urine. ?prostatitis although not classic presentation. Will refer urgently to urology since he is complaining of dysuria and penile discomfort. Start flomax since he is having some difficulty urinating. The patient indicates understanding of these issues and agrees with the plan.

## 2013-12-12 ENCOUNTER — Emergency Department: Payer: Self-pay | Admitting: Emergency Medicine

## 2013-12-12 ENCOUNTER — Telehealth: Payer: Self-pay

## 2013-12-12 LAB — URINALYSIS, COMPLETE
BACTERIA: NONE SEEN
BILIRUBIN, UR: NEGATIVE
BLOOD: NEGATIVE
Glucose,UR: NEGATIVE mg/dL (ref 0–75)
Ketone: NEGATIVE
Leukocyte Esterase: NEGATIVE
Nitrite: NEGATIVE
PH: 5 (ref 4.5–8.0)
Protein: NEGATIVE
Specific Gravity: 1.018 (ref 1.003–1.030)
Squamous Epithelial: 1
Transitional Epi: <1

## 2013-12-12 LAB — COMPREHENSIVE METABOLIC PANEL
ANION GAP: 6 — AB (ref 7–16)
AST: 54 U/L — AB (ref 15–37)
Albumin: 3.6 g/dL (ref 3.4–5.0)
Alkaline Phosphatase: 124 U/L — ABNORMAL HIGH
BUN: 10 mg/dL (ref 7–18)
Bilirubin,Total: 0.3 mg/dL (ref 0.2–1.0)
CHLORIDE: 101 mmol/L (ref 98–107)
CO2: 29 mmol/L (ref 21–32)
Calcium, Total: 8.5 mg/dL (ref 8.5–10.1)
Creatinine: 1.13 mg/dL (ref 0.60–1.30)
EGFR (Non-African Amer.): >60
Glucose: 226 mg/dL — ABNORMAL HIGH (ref 65–99)
OSMOLALITY: 278 (ref 275–301)
POTASSIUM: 3.7 mmol/L (ref 3.5–5.1)
SGPT (ALT): 111 U/L — ABNORMAL HIGH
SODIUM: 136 mmol/L (ref 136–145)
Total Protein: 7.3 g/dL (ref 6.4–8.2)

## 2013-12-12 LAB — CBC WITH DIFFERENTIAL/PLATELET
BASOS ABS: 0.1 10*3/uL (ref 0.0–0.1)
Basophil %: 0.6 %
EOS ABS: 0.2 10*3/uL (ref 0.0–0.7)
Eosinophil %: 1.9 %
HCT: 44.8 % (ref 40.0–52.0)
HGB: 14.9 g/dL (ref 13.0–18.0)
LYMPHS ABS: 2.9 10*3/uL (ref 1.0–3.6)
Lymphocyte %: 33.7 %
MCH: 29.4 pg (ref 26.0–34.0)
MCHC: 33.3 g/dL (ref 32.0–36.0)
MCV: 89 fL (ref 80–100)
MONOS PCT: 7.4 %
Monocyte #: 0.6 x10 3/mm (ref 0.2–1.0)
NEUTROS ABS: 4.8 10*3/uL (ref 1.4–6.5)
Neutrophil %: 56.4 %
Platelet: 226 10*3/uL (ref 150–440)
RBC: 5.06 10*6/uL (ref 4.40–5.90)
RDW: 14.5 % (ref 11.5–14.5)
WBC: 8.5 10*3/uL (ref 3.8–10.6)

## 2013-12-12 LAB — HEMOGLOBIN: HGB: 13.6 g/dL (ref 13.0–18.0)

## 2013-12-12 NOTE — Telephone Encounter (Signed)
Lm on pts vm requesting a call back 

## 2013-12-12 NOTE — Telephone Encounter (Signed)
Patient Information:  Caller Name: Schneider  Phone: [phone]  Patient: Gerald [name], Gerald [name]  Gender: Male  DOB: [birth year]  Age: 48 Years  PCP: Arnette Norris [facility])  Office Follow Up:  Does the office need to follow up with this patient?: No  Instructions For The Office: N/A   Symptoms  Reason For Call & Symptoms: Stomach pains on 12/04/13 passed jet black stool. Frequent Dark stools and stomach cramps all week and blood with wiping. On 12/10/13  he was having watery dark brown stool and was having large amounts of bleeding with wiping. Tissue soaked with bright red blood but stopped after 6 x wiping. No bm since 12/10/13. Today he feels like needs to pass stool but can't go. He has bad taste in mouth with burping-takes like blood. He is having night sweats. He has been thirsty and is drinking more fluids all week. BG = 110 today and had been normal all week. ED Dispostion and advised to go to [facility] or [facility] ER and call after checked for f/u appnt.  Reviewed Health History In EMR: Yes  Reviewed Medications In EMR: Yes  Reviewed Allergies In EMR: Yes  Reviewed Surgeries / Procedures: Yes  Date of Onset of Symptoms: 12/04/2013  Guideline(s) Used:  Diarrhea  Rectal Bleeding  Disposition Per Guideline:   Go to ED Now (or to Office with PCP Approval)  Reason For Disposition Reached:   Bloody, black, or tarry bowel movements  Advice Given:  Call Back If:  Bleeding increases in amount  Bleeding occurs 3 or more times after treatment begins  You become worse.  Patient Will Follow Care Advice:  YES

## 2013-12-12 NOTE — Telephone Encounter (Signed)
Gerald [name] with [facility] said pt had black stool 12/04/13 and for the following week had frequent dark stools; on 12/10/13 had dark stool with bright red blood. No BM since 12/10/13.. Pt burps metallic taste and having on and off cramping of abd. Sweating at times but blood sugar is good. No available appts at [facility] and [facility] has ED disposition; advised pt can go to ED for eval.

## 2013-12-12 NOTE — Telephone Encounter (Signed)
Spoke to pts wife who states that the pt is still at the hospital and is awaiting results from [facility] at [facility] Wife states that BP is 167/104, at this very moment. She states that the pt is "not feeling to good."

## 2013-12-12 NOTE — Telephone Encounter (Signed)
Agree with ED dispo.  Please call to check on pt.

## 2013-12-12 NOTE — Telephone Encounter (Signed)
Caller: Abhishek/Patient; Phone: [phone]; Reason for Call: Spoke to a nurse a few minutes ago and she advised that he go to [facility] ED.  Pt states that he is near [facility] and wants to know if he can go there.  Advised pt that we advise [place].

## 2013-12-19 ENCOUNTER — Telehealth: Payer: Self-pay | Admitting: Family Medicine

## 2013-12-19 DIAGNOSIS — K922 Gastrointestinal hemorrhage, unspecified: Secondary | ICD-10-CM

## 2013-12-19 NOTE — Telephone Encounter (Signed)
Spoke to pt who states that he was advised by [facility] to f/u with GI for possible colonoscopy and/or endoscopy, and was Dx with GI bleed

## 2013-12-19 NOTE — Telephone Encounter (Signed)
Referral placed.

## 2013-12-19 NOTE — Telephone Encounter (Signed)
Was he diagnosed with a GI bleed?

## 2013-12-19 NOTE — Telephone Encounter (Signed)
Pt was seen at [facility] Monday 12/12/13. He is still is a lot of pain. [facility] told pt to see GI and pt called [facility] and they refused to see him without a referral even though pt states his insurance doesn't require a referral. Can we put referral in for GI? I made hospital follow up visit for this Wednesday. Pt will bring hospital notes.

## 2013-12-20 ENCOUNTER — Encounter: Payer: Self-pay | Admitting: Internal Medicine

## 2013-12-21 ENCOUNTER — Ambulatory Visit: Payer: Self-pay | Admitting: Family Medicine

## 2013-12-23 ENCOUNTER — Telehealth: Payer: Self-pay | Admitting: Family Medicine

## 2013-12-23 NOTE — Telephone Encounter (Signed)
Patient did not come for their scheduled ER follow up appointment 11/18 @ 8:30am.  Please let me know if the patient needs to be contacted immediately for follow up or if no follow up is necessary.

## 2013-12-26 NOTE — Telephone Encounter (Signed)
Yes please schedule follow up

## 2013-12-27 ENCOUNTER — Ambulatory Visit (INDEPENDENT_AMBULATORY_CARE_PROVIDER_SITE_OTHER): Payer: 59 | Admitting: Internal Medicine

## 2013-12-27 ENCOUNTER — Other Ambulatory Visit (INDEPENDENT_AMBULATORY_CARE_PROVIDER_SITE_OTHER): Payer: 59

## 2013-12-27 ENCOUNTER — Encounter: Payer: Self-pay | Admitting: Internal Medicine

## 2013-12-27 VITALS — BP 162/98 | HR 88 | Ht 75.0 in | Wt 340.4 lb

## 2013-12-27 DIAGNOSIS — R1030 Lower abdominal pain, unspecified: Secondary | ICD-10-CM

## 2013-12-27 DIAGNOSIS — R7989 Other specified abnormal findings of blood chemistry: Secondary | ICD-10-CM

## 2013-12-27 DIAGNOSIS — K760 Fatty (change of) liver, not elsewhere classified: Secondary | ICD-10-CM

## 2013-12-27 DIAGNOSIS — K921 Melena: Secondary | ICD-10-CM

## 2013-12-27 DIAGNOSIS — R945 Abnormal results of liver function studies: Secondary | ICD-10-CM

## 2013-12-27 DIAGNOSIS — R634 Abnormal weight loss: Secondary | ICD-10-CM

## 2013-12-27 LAB — COMPREHENSIVE METABOLIC PANEL
ALBUMIN: 4 g/dL (ref 3.5–5.2)
ALT: 73 U/L — ABNORMAL HIGH (ref 0–53)
AST: 41 U/L — AB (ref 0–37)
Alkaline Phosphatase: 91 U/L (ref 39–117)
BUN: 13 mg/dL (ref 6–23)
CHLORIDE: 101 meq/L (ref 96–112)
CO2: 27 mEq/L (ref 19–32)
Calcium: 8.9 mg/dL (ref 8.4–10.5)
Creatinine, Ser: 1 mg/dL (ref 0.4–1.5)
GFR: 87.08 mL/min (ref >60.00)
GLUCOSE: 211 mg/dL — AB (ref 70–99)
POTASSIUM: 3.8 meq/L (ref 3.5–5.1)
Sodium: 136 mEq/L (ref 135–145)
TOTAL PROTEIN: 7.2 g/dL (ref 6.0–8.3)
Total Bilirubin: 0.6 mg/dL (ref 0.2–1.2)

## 2013-12-27 LAB — CBC WITH DIFFERENTIAL/PLATELET
Basophils Absolute: 0 10*3/uL (ref 0.0–0.1)
Basophils Relative: 0.4 % (ref 0.0–3.0)
EOS ABS: 0.1 10*3/uL (ref 0.0–0.7)
Eosinophils Relative: 1.6 % (ref 0.0–5.0)
HCT: 44.6 % (ref 39.0–52.0)
Hemoglobin: 15 g/dL (ref 13.0–17.0)
Lymphocytes Relative: 32.3 % (ref 12.0–46.0)
Lymphs Abs: 2.8 10*3/uL (ref 0.7–4.0)
MCHC: 33.6 g/dL (ref 30.0–36.0)
MCV: 87 fl (ref 78.0–100.0)
MONO ABS: 0.5 10*3/uL (ref 0.1–1.0)
Monocytes Relative: 6.1 % (ref 3.0–12.0)
NEUTROS PCT: 59.6 % (ref 43.0–77.0)
Neutro Abs: 5.1 10*3/uL (ref 1.4–7.7)
Platelets: 236 10*3/uL (ref 150.0–400.0)
RBC: 5.12 Mil/uL (ref 4.22–5.81)
RDW: 13.8 % (ref 11.5–15.5)
WBC: 8.5 10*3/uL (ref 4.0–10.5)

## 2013-12-27 MED ORDER — OMEPRAZOLE 40 MG PO CPDR: 40.0000 mg | DELAYED_RELEASE_CAPSULE | Freq: Every day | ORAL | Status: DC

## 2013-12-27 NOTE — Progress Notes (Signed)
HISTORY OF PRESENT ILLNESS:  Gerald [name] is a 48 y.o. male with multiple significant medical problems including morbid obesity, sleep apnea, hypertension, hyperlipidemia, type 2 diabetes mellitus, and hepatic steatosis. The patient is sent today by his primary care physician regarding problems with abdominal pain and dark stools. Patient has had previous GI care in [facility] with Dr. Humphrey Rolls (no records, but requested). He states that his current history began about 4-5 weeks ago when he developed lower abdominal pain. Review of outside records shows complaints of right flank pain for which she underwent noncontrast enhanced CT scan of the abdomen and pelvis 11/22/2013. This was negative except for hepatic steatosis. The patient had blood work on 11/08/2013. Mildly elevated hepatic transaminases with AST 46 and ALT 83. CBC was normal. Patient is also reported more recent problems with goal intermittent lower abdominal discomfort occasionally exacerbated by meals. He also reports several weeks of dark stools which resolved 3 days ago. Now with brown stools. He states he has lost about 15-20 pounds in the past 3 weeks. Other complaints include bloating, decreased appetite, and occasional urgent stools. He takes daily aspirin as well as NSAID several times weekly. He is also concerned because of a family history of gastric cancer in his father who is deceased. He cannot elaborate on prior GI history but tells me he probably had colonoscopy 6 years ago with "polyps" being found. He was having some type of problem with pain at that time.  REVIEW OF SYSTEMS:  All non-GI ROS negative except for arthritis, back pain, visual change, fatigue, muscle cramps, night sweats, sleeping problems, ankle edema, urinary leakage  Past Medical History  Diagnosis Date  . Myocardial infarction 09/2011  . Hypertension   . Hyperlipidemia   . Obesity   . Diabetes mellitus without complication     borderline  .  Nonischemic cardiomyopathy     Previous ejection fraction of 35-40% on echo. 50% on cardiac catheterization in August of 2013  . Sleep apnea   . Coronary artery disease     Mild nonobstructive coronary artery disease on previous cardiac catheterization most recently in August of 2013  . Gastritis   . Hepatic steatosis     Past Surgical History  Procedure Laterality Date  . Colonoscopy    . Cardiac catheterization N/A 09/2011    [facility]; EF 50% with 30% mid LAD stenosis and no obstructive disease.  . Cardiac catheterization  09/2010    [facility]; Mid LAD 40% stenosis; Mid Circumflex:Normal; Mid RCA; Normal  . Knee arthroscopy with medial menisectomy Right 09/16/2012    Procedure: RIGHT KNEE ARTHROSCOPY WITH MEDIAL AND LATERAL MENISECTOMY, CHONDROPLASTY;  Surgeon: Ninetta Lights, MD;  Location: [facility];  Service: Orthopedics;  Laterality: Right;  RIGHT KNEE SCOPE MEDIAL MENISCECTOMY    Social History Gerald [name]  reports that he has never smoked. He has never used smokeless tobacco. He reports that he does not drink alcohol or use illicit drugs.  family history includes Breast cancer in his mother; Heart attack in his father; Heart disease (age of onset: 87) in his father; Hypertension in his mother; Stomach cancer in his father.  Allergies  Allergen Reactions  . Statins Rash            PHYSICAL EXAMINATION: Vital signs: BP 162/98 mmHg  Pulse 88  Ht 6\' 3"  (1.905 m)  Wt 340 lb 6.4 oz (154.404 kg)  BMI 42.55 kg/m2  Constitutional: Obese, generally well-appearing, no acute distress Psychiatric:  Anxious and somewhat depressed-appearing, alert and oriented x3, cooperative Eyes: extraocular movements intact, anicteric, conjunctiva pink Mouth: oral pharynx moist, no lesions Neck: supple no lymphadenopathy Cardiovascular: heart regular rate and rhythm, no murmur Lungs: clear to auscultation bilaterally Abdomen: soft, moderately tender throughout the lower abdomen  with palpation, nondistended, no obvious ascites, no peritoneal signs, normal bowel sounds, no organomegaly Rectal: Omitted. Extremities: Trace lower extremity edema bilaterally Skin: no lesions on visible extremities Neuro: No focal deficits. No asterixis.   ASSESSMENT:  #1. Lower abdominal pain. Intermittent. Recent noncontrast CT negative moderate tenderness on exam today. Now with persistent worsening pain and weight loss. #2. Reports of dark stools. Rule out peptic ulcer #3. Reports prior colonoscopy with polyps. No details #4. Abnormal liver tests. Likely secondary to fatty liver #5. Multiple medical problems   PLAN:  #1. CBC and comprehensive metabolic panel today #2. Prescribe omeprazole 40 mg daily #3. Repeat CT scan of the abdomen and pelvis. This time, with contrast. Worsening persistent lower abdominal pain, weight loss and tenderness on exam, evaluate #4. Upper endoscopy to rule out ulcer disease.The nature of the procedure, as well as the risks, benefits, and alternatives were carefully and thoroughly reviewed with the patient. Ample time for discussion and questions allowed. The patient understood, was satisfied, and agreed to proceed. #5. Stop NSAIDs #6. Weight loss #7. Obtain outside records for review

## 2013-12-27 NOTE — Patient Instructions (Addendum)
Your physician has requested that you go to the basement for the following lab work before leaving today:  CBC, CMET  We have sent the following medications to your pharmacy for you to pick up at your convenience:  Omeprazole   You have been scheduled for a CT scan of the abdomen and pelvis at [facility] ([address]---this is in the same building as [facility]).   You are scheduled on  12/28/2013 at 11:00am. You should arrive 15 minutes prior to your appointment time for registration. Please follow the written instructions below on the day of your exam:  WARNING: IF YOU ARE ALLERGIC TO IODINE/X-RAY DYE, PLEASE NOTIFY RADIOLOGY IMMEDIATELY AT [phone]! YOU WILL BE GIVEN A 13 HOUR PREMEDICATION PREP.  1) Do not eat or drink anything after 7:00am (4 hours prior to your test) 2) You have been given 2 bottles of oral contrast to drink. The solution may taste               better if refrigerated, but do NOT add ice or any other liquid to this solution. Shake             well before drinking.    Drink 1 bottle of contrast @ 9:00am (2 hours prior to your exam)  Drink 1 bottle of contrast @ 10:00am (1 hour prior to your exam)  You may take any medications as prescribed with a small amount of water except for the following: Metformin, Glucophage, Glucovance, Avandamet, Riomet, Fortamet, Actoplus Met, Janumet, Glumetza or Metaglip. The above medications must be held the day of the exam AND 48 hours after the exam.  The purpose of you drinking the oral contrast is to aid in the visualization of your intestinal tract. The contrast solution may cause some diarrhea. Before your exam is started, you will be given a small amount of fluid to drink. Depending on your individual set of symptoms, you may also receive an intravenous injection of x-ray contrast/dye. Plan on being at [facility] for 30 minutes or long, depending on the type of exam you are having performed.  If you  have any questions regarding your exam or if you need to reschedule, you may call the CT department at [phone] between the hours of 8:00 am and 5:00 pm, Monday-Friday.  ________________________________________________________________________  Dennis Bast have been scheduled for an endoscopy. Please follow written instructions given to you at your visit today. If you use inhalers (even only as needed), please bring them with you on the day of your procedure. Your physician has requested that you go to www.startemmi.com and enter the access code given to you at your visit today. This web site gives a general overview about your procedure. However, you should still follow specific instructions given to you by our office regarding your preparation for the procedure.

## 2013-12-28 ENCOUNTER — Inpatient Hospital Stay: Admission: RE | Admit: 2013-12-28 | Payer: Self-pay | Source: Ambulatory Visit

## 2014-01-02 ENCOUNTER — Ambulatory Visit (INDEPENDENT_AMBULATORY_CARE_PROVIDER_SITE_OTHER)
Admission: RE | Admit: 2014-01-02 | Discharge: 2014-01-02 | Disposition: A | Discharge status: Home / Self Care | Payer: 59 | Source: Ambulatory Visit | Attending: Internal Medicine | Admitting: Internal Medicine

## 2014-01-02 DIAGNOSIS — R1030 Lower abdominal pain, unspecified: Secondary | ICD-10-CM

## 2014-01-02 MED ORDER — IOHEXOL 300 MG/ML  SOLN
100.0000 mL | Freq: Once | INTRAMUSCULAR | Status: AC | PRN
  Administered 2014-01-02: 100 mL via INTRAVENOUS

## 2014-01-03 ENCOUNTER — Encounter: Payer: Self-pay | Admitting: Internal Medicine

## 2014-01-03 ENCOUNTER — Ambulatory Visit (AMBULATORY_SURGERY_CENTER): Payer: 59 | Admitting: Internal Medicine

## 2014-01-03 ENCOUNTER — Telehealth: Payer: Self-pay | Admitting: *Deleted

## 2014-01-03 VITALS — BP 144/88 | HR 81 | Temp 98.8°F | Resp 24 | Ht 75.0 in | Wt 340.0 lb

## 2014-01-03 DIAGNOSIS — K2100 Gastro-esophageal reflux disease with esophagitis, without bleeding: Secondary | ICD-10-CM

## 2014-01-03 DIAGNOSIS — K921 Melena: Secondary | ICD-10-CM

## 2014-01-03 LAB — GLUCOSE, CAPILLARY
Glucose-Capillary: 142 mg/dL — ABNORMAL HIGH (ref 70–99)
Glucose-Capillary: 117 mg/dL — ABNORMAL HIGH (ref 70–99)

## 2014-01-03 MED ORDER — SODIUM CHLORIDE 0.9 % IV SOLN: 500.0000 mL | INTRAVENOUS | Status: DC

## 2014-01-03 NOTE — Patient Instructions (Signed)
STAY ON THE OMEPRAZOLE DAILY.  AVOID NSAIDS(ANTIINFLAMMATORIES----MOTRIN, ALEVE, ADVIL ETC)  CALL DR PERRY'S OFFICE FOR A FOLLOW UP APPOINTMENT IN 6-8 WEEKS.    YOU HAD AN ENDOSCOPIC PROCEDURE TODAY AT [facility]: Refer to the procedure report that was given to you for any specific questions about what was found during the examination.  If the procedure report does not answer your questions, please call your gastroenterologist to clarify.  If you requested that your care partner not be given the details of your procedure findings, then the procedure report has been included in a sealed envelope for you to review at your convenience later.  YOU SHOULD EXPECT: Some feelings of bloating in the abdomen. Passage of more gas than usual.  Walking can help get rid of the air that was put into your GI tract during the procedure and reduce the bloating. If you had a lower endoscopy (such as a colonoscopy or flexible sigmoidoscopy) you may notice spotting of blood in your stool or on the toilet paper. If you underwent a bowel prep for your procedure, then you may not have a normal bowel movement for a few days.  DIET: Your first meal following the procedure should be a light meal and then it is ok to progress to your normal diet.  A half-sandwich or bowl of soup is an example of a good first meal.  Heavy or fried foods are harder to digest and may make you feel nauseous or bloated.  Likewise meals heavy in dairy and vegetables can cause extra gas to form and this can also increase the bloating.  Drink plenty of fluids but you should avoid alcoholic beverages for 24 hours.  ACTIVITY: Your care partner should take you home directly after the procedure.  You should plan to take it easy, moving slowly for the rest of the day.  You can resume normal activity the day after the procedure however you should NOT DRIVE or use heavy machinery for 24 hours (because of the sedation medicines used during the  test).    SYMPTOMS TO REPORT IMMEDIATELY: A gastroenterologist can be reached at any hour.  During normal business hours, 8:30 AM to 5:00 PM Monday through Friday, call [phone].  After hours and on weekends, please call the GI answering service at [phone] who will take a message and have the physician on call contact you.   Following upper endoscopy (EGD)  Vomiting of blood or coffee ground material  New chest pain or pain under the shoulder blades  Painful or persistently difficult swallowing  New shortness of breath  Fever of 100F or higher  Black, tarry-looking stools  FOLLOW UP: If any biopsies were taken you will be contacted by phone or by letter within the next 1-3 weeks.  Call your gastroenterologist if you have not heard about the biopsies in 3 weeks.  Our staff will call the home number listed on your records the next business day following your procedure to check on you and address any questions or concerns that you may have at that time regarding the information given to you following your procedure. This is a courtesy call and so if there is no answer at the home number and we have not heard from you through the emergency physician on call, we will assume that you have returned to your regular daily activities without incident.  SIGNATURES/CONFIDENTIALITY: You and/or your care partner have signed paperwork which will be entered into your electronic medical  record.  These signatures attest to the fact that that the information above on your After Visit Summary has been reviewed and is understood.  Full responsibility of the confidentiality of this discharge information lies with you and/or your care-partner. 

## 2014-01-03 NOTE — Progress Notes (Signed)
Pt. States he has a very bad smell of "sulpher" when he belches.  Will ask Dr. Henrene Pastor if he can explain why, and contact pt.

## 2014-01-03 NOTE — Telephone Encounter (Signed)
Pt. Advised that Dr. Henrene Pastor had no answer for belching, "sulpher odor".  He was advised that Dr. Henrene Pastor did not attribute this to any of his findings On EGD today.

## 2014-01-03 NOTE — Op Note (Signed)
[facility]  [address] [place], [id number]   ENDOSCOPY PROCEDURE REPORT  PATIENT: Gerald, [name]  MR#: [id number] BIRTHDATE: [birth year] , 75  yrs. old GENDER: male ENDOSCOPIST: Eustace Quail, MD REFERRED BY:  Arnette Norris, M.D. PROCEDURE DATE:  01/03/2014 PROCEDURE:  EGD w/ biopsy ASA CLASS:     Class II INDICATIONS:  ? melena and abdominal pain. MEDICATIONS: Monitored anesthesia care and Propofol 160 mg IV TOPICAL ANESTHETIC: none  DESCRIPTION OF PROCEDURE: After the risks benefits and alternatives of the procedure were thoroughly explained, informed consent was obtained.  The LB [id number] endoscope was introduced through the mouth and advanced to the second portion of the duodenum , Without limitations.  The instrument was slowly withdrawn as the mucosa was fully examined.    The esophagus revealed mild esophagitis at the mucosal Z line.  The stomach revealed several superficial antral erosions.  CLO biopsy taken.  Incidental benign appearing fundic gland polyps noted.  The duodenum was normal.  Retroflexed views revealed no abnormalities. The scope was then withdrawn from the patient and the procedure completed.  COMPLICATIONS: There were no immediate complications.  ENDOSCOPIC IMPRESSION: 1. GERD with esophagitis 2. Mild antral erosions likely secondary to NSAIDs 3. Incidental fundic gland polyps. Otherwise normal EGD  RECOMMENDATIONS: 1.  Continue omeprazole 40 mg daily 2.  Avoid NSAIDS (anti-inflammatories) and possible 3.  Rx CLO if positive 4.  Call for office visit to be seen by Dr. Henrene Pastor in about 6 or 8 weeks  REPEAT EXAM:  eSigned:  Eustace Quail, MD 01/03/2014 12:28 PM    CC:The Patient and Arnette Norris MD

## 2014-01-03 NOTE — Progress Notes (Signed)
Procedure ends, to recovery, report given and VSS. 

## 2014-01-03 NOTE — Progress Notes (Signed)
Called to room to assist during endoscopic procedure.  Patient ID and intended procedure confirmed with present staff. Received instructions for my participation in the procedure from the performing physician.  

## 2014-01-04 ENCOUNTER — Telehealth: Payer: Self-pay

## 2014-01-04 NOTE — Telephone Encounter (Signed)
  Follow up Call-  Call back number 01/03/2014  Post procedure Call Back phone  # [phone]  Permission to leave phone message Yes     Patient questions:  Do you have a fever, pain , or abdominal swelling? No. Pain Score  0 *  Have you tolerated food without any problems? Yes.    Have you been able to return to your normal activities? Yes.    Do you have any questions about your discharge instructions: Diet   No. Medications  No. Follow up visit  No.  Do you have questions or concerns about your Care? No.  Actions: * If pain score is 4 or above: No action needed, pain <4.

## 2014-01-05 LAB — HELICOBACTER PYLORI SCREEN-BIOPSY: UREASE: NEGATIVE

## 2014-01-31 ENCOUNTER — Telehealth: Payer: Self-pay

## 2014-01-31 DIAGNOSIS — F32A Depression, unspecified: Secondary | ICD-10-CM

## 2014-01-31 DIAGNOSIS — F329 Major depressive disorder, single episode, unspecified: Secondary | ICD-10-CM

## 2014-01-31 MED ORDER — CITALOPRAM HYDROBROMIDE 20 MG PO TABS: 20.0000 mg | ORAL_TABLET | Freq: Every day | ORAL | Status: DC

## 2014-01-31 NOTE — Telephone Encounter (Signed)
What medication

## 2014-01-31 NOTE — Addendum Note (Signed)
Addended by: Lurlean Nanny on: 01/31/2014 11:41 AM   Modules accepted: Orders

## 2014-01-31 NOTE — Telephone Encounter (Signed)
Last OV with Dr Deborra Medina 11/23/13--last filled 11/08/13 # 90--please advise

## 2014-01-31 NOTE — Telephone Encounter (Signed)
celexa--sorry

## 2014-02-09 NOTE — Telephone Encounter (Signed)
[place] with [facility] left v/m requesting cb [phone] to clarify celexa rx and who supervisory physician is; ref # [id number].

## 2014-02-10 NOTE — Telephone Encounter (Signed)
Called [facility] and problem has been resolved

## 2014-02-27 ENCOUNTER — Ambulatory Visit: Payer: Self-pay | Admitting: Internal Medicine

## 2014-03-27 ENCOUNTER — Ambulatory Visit: Payer: Self-pay | Admitting: Internal Medicine

## 2014-05-22 ENCOUNTER — Ambulatory Visit (INDEPENDENT_AMBULATORY_CARE_PROVIDER_SITE_OTHER): Payer: 59 | Admitting: Family Medicine

## 2014-05-22 ENCOUNTER — Observation Stay
Admit: 2014-05-22 | Disposition: A | Discharge status: Home / Self Care | Payer: Self-pay | Attending: Internal Medicine | Admitting: Internal Medicine

## 2014-05-22 ENCOUNTER — Encounter: Payer: Self-pay | Admitting: Family Medicine

## 2014-05-22 VITALS — BP 178/105 | HR 81 | Temp 98.1°F | Wt 340.5 lb

## 2014-05-22 DIAGNOSIS — R0789 Other chest pain: Secondary | ICD-10-CM | POA: Diagnosis not present

## 2014-05-22 DIAGNOSIS — R220 Localized swelling, mass and lump, head: Secondary | ICD-10-CM

## 2014-05-22 DIAGNOSIS — I10 Essential (primary) hypertension: Secondary | ICD-10-CM

## 2014-05-22 DIAGNOSIS — I25119 Atherosclerotic heart disease of native coronary artery with unspecified angina pectoris: Secondary | ICD-10-CM

## 2014-05-22 DIAGNOSIS — M278 Other specified diseases of jaws: Secondary | ICD-10-CM | POA: Insufficient documentation

## 2014-05-22 LAB — CBC
HCT: 43 % (ref 40.0–52.0)
HGB: 14.5 g/dL (ref 13.0–18.0)
MCH: 28.9 pg (ref 26.0–34.0)
MCHC: 33.7 g/dL (ref 32.0–36.0)
MCV: 86 fL (ref 80–100)
PLATELETS: 230 10*3/uL (ref 150–440)
RBC: 5.01 10*6/uL (ref 4.40–5.90)
RDW: 14.5 % (ref 11.5–14.5)
WBC: 10.6 10*3/uL (ref 3.8–10.6)

## 2014-05-22 LAB — BASIC METABOLIC PANEL
ANION GAP: 7 (ref 7–16)
BUN: 16 mg/dL
Calcium, Total: 9.1 mg/dL
Chloride: 104 mmol/L
Co2: 26 mmol/L
Creatinine: 0.99 mg/dL
EGFR (Non-African Amer.): >60
Glucose: 168 mg/dL — ABNORMAL HIGH
POTASSIUM: 3.9 mmol/L
Sodium: 137 mmol/L

## 2014-05-22 LAB — TROPONIN I

## 2014-05-22 NOTE — Assessment & Plan Note (Signed)
?  parotiditis If not addressed in ER, he will call me tomorrow to discuss further work up/tx. The patient indicates understanding of these issues and agrees with the plan.

## 2014-05-22 NOTE — Progress Notes (Signed)
Subjective:   Patient ID: Gerald [name], male    DOB: 03/22/1965, 49 y.o.   MRN: [id number]  Gerald [name] is a pleasant 49 y.o. year old male who presents to clinic today with Neck Pain  on 05/22/2014  HPI:  Presented here with neck pain but blood pressure is very elevated and has been at home for the past week. He has noted left sided chest pressure and diaphoresis.  Chest pain is not worsened by exertion or relieved by rest- just "random." Vomited once this weekend.  No longer nauseated.  "It does feel a little like my past heart attack."  Followed by cardiology- Dr. Fletcher Anon but has not been seen by them since last year.  Taking ASA 81 mg daily, Lisinopril 5 mg daily and Toprol XL 25 mg daily. Intolerant to statins.  Takes Lopid.  Did not take NTG for the chest pain because "I am stubborn I guess."  Current Outpatient Prescriptions on File Prior to Visit  Medication Sig Dispense Refill  . aspirin 81 MG EC tablet Take 81 mg by mouth every evening. Swallow whole.    . citalopram (CELEXA) 20 MG tablet Take 1 tablet (20 mg total) by mouth daily. 90 tablet 0  . furosemide (LASIX) 20 MG tablet Take 1 tablet (20 mg total) by mouth daily. 90 tablet 1  . gemfibrozil (LOPID) 600 MG tablet Take 1 tablet (600 mg total) by mouth 2 (two) times daily before a meal. 180 tablet 1  . glucose blood test strip Use as instructed 100 each 12  . lisinopril (PRINIVIL,ZESTRIL) 5 MG tablet Take 1 tablet (5 mg total) by mouth daily. 90 tablet 1  . metFORMIN (GLUCOPHAGE) 500 MG tablet Take 500 mg by mouth 2 (two) times daily with a meal.    . metoprolol succinate (TOPROL-XL) 25 MG 24 hr tablet Take 1 tablet (25 mg total) by mouth daily. 90 tablet 1  . nitroGLYCERIN (NITROSTAT) 0.4 MG SL tablet Place 1 tablet (0.4 mg total) under the tongue every 5 (five) minutes as needed for chest pain. 75 tablet 1  . omeprazole (PRILOSEC) 40 MG capsule Take 1 capsule (40 mg total) by mouth daily. 30 capsule 6  .  ondansetron (ZOFRAN-ODT) 4 MG disintegrating tablet     . potassium chloride SA (K-DUR,KLOR-CON) 20 MEQ tablet Take 1 tablet (20 mEq total) by mouth daily. 90 tablet 1  . tamsulosin (FLOMAX) 0.4 MG CAPS capsule Take 1 capsule (0.4 mg total) by mouth daily. 30 capsule 0   No current facility-administered medications on file prior to visit.    Allergies  Allergen Reactions  . Statins Rash         Past Medical History  Diagnosis Date  . Myocardial infarction 09/2011  . Hypertension   . Hyperlipidemia   . Obesity   . Diabetes mellitus without complication     borderline  . Nonischemic cardiomyopathy     Previous ejection fraction of 35-40% on echo. 50% on cardiac catheterization in August of 2013  . Sleep apnea   . Coronary artery disease     Mild nonobstructive coronary artery disease on previous cardiac catheterization most recently in August of 2013  . Gastritis   . Hepatic steatosis   . Abnormal LFTs   . GERD (gastroesophageal reflux disease)   . Fundic gland polyps of stomach, benign     Past Surgical History  Procedure Laterality Date  . Colonoscopy    . Cardiac catheterization N/A 09/2011  [facility]; EF 50% with 30% mid LAD stenosis and no obstructive disease.  . Cardiac catheterization  09/2010    [facility]; Mid LAD 40% stenosis; Mid Circumflex:Normal; Mid RCA; Normal  . Knee arthroscopy with medial menisectomy Right 09/16/2012    Procedure: RIGHT KNEE ARTHROSCOPY WITH MEDIAL AND LATERAL MENISECTOMY, CHONDROPLASTY;  Surgeon: Ninetta Lights, MD;  Location: [facility];  Service: Orthopedics;  Laterality: Right;  RIGHT KNEE SCOPE MEDIAL MENISCECTOMY    Family History  Problem Relation Age of Onset  . Heart disease Father 29    MI  . Heart attack Father   . Stomach cancer Father   . Hypertension Mother   . Breast cancer Mother     History   Social History  . Marital Status: Married    Spouse Name: N/A  . Number of Children: 3  . Years of Education: N/A    Occupational History  . 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 Smoking status: Never Smoker   . Smokeless tobacco: Never Used  . Alcohol Use: No     Comment: rare  . Drug Use: No  . Sexual Activity: Not on file   Other Topics Concern  . Not on file   Social History Narrative   Married.  67 yo son.   Wife on disability for bipolar disorder.     The PMH, PSH, Social History, Family History, Medications, and allergies have been reviewed in [facility], and have been updated if relevant.    Review of Systems  Constitutional: Positive for diaphoresis and fatigue.  Respiratory: Positive for chest tightness and shortness of breath.   Cardiovascular: Positive for chest pain. Negative for palpitations.  Gastrointestinal: Positive for nausea and vomiting.  Musculoskeletal: Negative.   Skin: Negative.        Objective:    BP 178/105 mmHg  Pulse 81  Temp(Src) 98.1 F (36.7 C) (Oral)  Wt 340 lb 8 oz (154.45 kg)  SpO2 93%   Physical Exam  Constitutional: He is oriented to person, place, and time. He appears well-developed and well-nourished. No distress.  HENT:  Head: Normocephalic.  Eyes: Conjunctivae are normal.  Neck:  Firm mass, upper jaw line, right  Cardiovascular: Normal rate and regular rhythm.   Pulmonary/Chest: Effort normal.  Musculoskeletal: He exhibits no edema.  Neurological: He is alert and oriented to person, place, and time. No cranial nerve deficit.  Skin: Skin is warm and dry.  Psychiatric: He has a normal mood and affect. His behavior is normal. Thought content normal.  Nursing note and vitals reviewed.         Assessment & Plan:   Essential hypertension  Coronary artery disease involving native coronary artery of native heart with angina pectoris  Other chest pain No Follow-up on file.

## 2014-05-22 NOTE — Assessment & Plan Note (Signed)
EKG reassuring but given his constellation of other concerning symptoms, I did advise his son to take him to ER right now.  He needs further work up- rule out PE, troponins, etc. The patient indicates understanding of these issues and agrees with the plan.

## 2014-05-22 NOTE — Progress Notes (Signed)
Pre visit review using our clinic review tool, if applicable. No additional management support is needed unless otherwise documented below in the visit note. 

## 2014-05-23 DIAGNOSIS — I25110 Atherosclerotic heart disease of native coronary artery with unstable angina pectoris: Secondary | ICD-10-CM | POA: Diagnosis not present

## 2014-05-23 DIAGNOSIS — I2510 Atherosclerotic heart disease of native coronary artery without angina pectoris: Secondary | ICD-10-CM | POA: Diagnosis not present

## 2014-05-23 DIAGNOSIS — I200 Unstable angina: Secondary | ICD-10-CM | POA: Diagnosis not present

## 2014-05-23 LAB — TROPONIN I

## 2014-05-23 LAB — HEMOGLOBIN A1C: HEMOGLOBIN A1C: 7.1 % — AB

## 2014-05-23 NOTE — Discharge Summary (Signed)
PATIENT NAME:  Gerald [name], ROBARTS MR#:  [id number] DATE OF BIRTH:  [birth year]  DATE OF ADMISSION:  09/12/2011 DATE OF DISCHARGE:  09/13/2011  PRESENTING COMPLAINT: Chest pain.   DISCHARGE DIAGNOSES:  1. Chest pain, appears noncardiac at this time.  2. Mild coronary artery disease per catheterization, 25% mid LAD lesion. No change from previous cath of 2012.  3. Hypertension.  4. Morbid obesity.   PROCEDURES: Cardiac catheterization, done by Dr. Clayborn Bigness, showed mid LAD 25 to 30% stenosis.   DISCHARGE MEDICATIONS:  1. Metoprolol XL 25 mg p.o. daily.  2. Aspirin 81 mg daily.  3. Atorvastatin 10 mg daily.  4. Lisinopril 5 mg p.o. daily.   DISCHARGE INSTRUCTIONS/FOLLOWUP: Followup with your primary cardiologist, Dr. Neoma Laming. The patient is advised to make appointment. The patient is also advised to call and make an appointment with a PCP in the area. He was given a list of PCPs.   LABS/RADIOLOGIC STUDIES: CBC within normal limits. Basic metabolic panel within normal limits. Magnesium 1.9. Hemoglobin A1c 6.8. LDL 115, HDL 34, and cholesterol 176. Cardiac enzymes x3 negative.   Echo Doppler showed ejection fraction of around 35 to 45%. Left ventricular function is low normal.   BRIEF SUMMARY OF HOSPITAL COURSE: Gerald [name] is a 49 year old morbidly obese Caucasian gentleman with history of hypertension and hyperlipidemia who came in with:  1. Chest pain. Given the patient's risk factors he was admitted. His cardiac enzymes remained negative. He was in sinus rhythm. He underwent cardiac catheterization by Dr. Clayborn Bigness and has an ejection fraction of around 50% by cardiac catheterization, however, an echo showed an ejection fraction around 35 to 45%. His cardiac catheterization showed mid LAD 25 to 30% stenosis. There was no change from previous echocardiogram, which was done in 2012. The patient was advised to continue his beta blockers, aspirin, and statins. He was also advised weight  reduction.  2. Hypertension. Better controlled with metoprolol.  3. Hyperlipidemia. The patient is on statins.   His hospital stay otherwise remained stable. The patient remained a FULL CODE. He will followup with his primary cardiologist, Dr. Neoma Laming, as an outpatient.   TIME SPENT: 40 minutes. ____________________________ Hart Rochester Posey Pronto, MD sap:slb D: 09/14/2011 07:15:27 ET T: 09/15/2011 11:16:13 ET JOB#: [id number]  cc: Shemicka Cohrs A. Posey Pronto, MD, <Dictator> Dionisio David, MD Dwayne D. Clayborn Bigness, MD Ilda Basset MD ELECTRONICALLY SIGNED 09/15/2011 16:06

## 2014-05-23 NOTE — H&P (Signed)
PATIENT NAME:  Gerald [name], Gerald [name] MR#:  [id number] DATE OF BIRTH:  [birth year]  DATE OF ADMISSION:  09/12/2011  REFERRING PHYSICIAN: Dr. Renee Ramus   PRIMARY CARE PHYSICIAN: None.   CHIEF COMPLAINT: Chest pain.   HISTORY OF PRESENT ILLNESS: The patient is a 49 year old Caucasian male with history of hypertension, obesity, hyperlipidemia, history positive for cath in 2012 showing 40% stenosis in mid LAD with depressed EF in August of 2012 who presents with chest pain. The patient has been having off and on chest pain for two weeks which is dull, could last up to an hour, associated with radiation to the left arm, sometimes to the neck with diaphoresis and nausea. There is no vomiting. Of note the patient had a cath as described above last year and was discharged on p.o. medicines including aspirin, beta-blocker, lisinopril, and a statin. Of note, he has been noncompliant with these. He stated that his PCP before retiring took him off of these medicines. He has not been taking his aspirin either. He came in today as the chest pain persisted and progressed. The pain could come with exertion or without. While here he has a negative troponin and no acute ST changes, however, he has had several doses of morphine. He has also been given an aspirin. The hospitalist service was contacted for further evaluation and management.   PAST MEDICAL HISTORY:  1. Coronary artery disease status post cath in August 2012. 2. History of positive stress test.  3. Hypertension.  4. Hyperlipidemia.  5. Obesity.    FAMILY HISTORY: Positive for coronary artery disease and MI. Mom with breast cancer. Stomach cancer in father. History of strokes in the family as well.   SOCIAL HISTORY: No tobacco. Positive for occasional alcohol. No other drug use. Works for [employer].   MEDICATIONS: Does not take any medications.   ALLERGIES: Per chart, pantoprazole.  REVIEW OF SYSTEMS: CONSTITUTIONAL: No fever or fatigue.  EYES: Has occasional blurry vision with episode of chest pain. No double vision. ENT: No tinnitus or hearing loss. RESPIRATORY: No cough, wheezing, or hemoptysis. Positive for shortness of breath. CARDIOVASCULAR: Positive for chest pain as above. History of hypertension. No palpitations or syncope. GI: Positive for nausea. No vomiting. Episode of diarrhea three days ago. No abdominal pain, hematemesis, or bloody stools. GU: No dysuria or hematuria. HEME/LYMPH: No anemia or easy bruising. SKIN: No new rashes. MUSCULOSKELETAL: Denies arthritis. NEUROLOGIC: Numbness in left great toe. PSYCH: No anxiety or depression.   PHYSICAL EXAMINATION:   VITAL SIGNS: On arrival temperature 97.8, pulse 79, respiratory rate 18, blood pressure 172/107, last blood pressure 145/83, oxygen sat 96% on room air.   GENERAL: The patient is an obese Caucasian male laying in bed in mild distress.   HEENT: Normocephalic, atraumatic. Pupils are equal and reactive. Extraocular muscles intact. Moist mucous membranes.   NECK: Supple. No JVD. No thyroid tenderness.   CARDIOVASCULAR: S1, S2. No murmurs, rubs, or gallops.   LUNGS: Clear to auscultation without wheezing or rhonchi.   ABDOMEN: Soft, nontender, nondistended. Positive bowel sounds in all quadrants. No organomegaly could be appreciated.   EXTREMITIES: No significant lower extremity edema.   NEUROLOGICAL: Cranial nerves II through XII grossly intact. Strength is 5 out of 5 in all extremities. Sensation intact to light touch.   PSYCH: Awake, alert, oriented x3. Cooperative.   SKIN: No obvious rashes.   LABORATORY, DIAGNOSTIC, AND RADIOLOGICAL DATA: Glucose 131, BUN 12, creatinine 1.12, sodium 140, potassium 3.7. Troponin negative x1.  WBC 9, hemoglobin 14.5, hematocrit 41.2, platelets 232.  Echocardiogram last year showing moderate to severe LV dysfunction with EF of 35 to 45%, moderate dilation of the LV. No thrombus. Mild concentric left ventricular  hypertrophy.  X-ray of the chest PA and lateral this morning show no acute cardiopulmonary disease.   EKG normal sinus rhythm, septal infarct, Q waves in V1, V2, and V3. No acute ST elevations or depressions. There are some T wave inversions in 3, some flattening in aVF.   ASSESSMENT AND PLAN: We have a 49 year old Caucasian male with history of hypertension, obesity, hyperlipidemia, coronary artery disease status post positive stress test and a cath in August of last year showing some LAD disease who is not taking medications for several months presents with probable unstable angina. The patient has a negative troponin. He will be admitted to the hospital and initiated on heparin drip, aspirin, and nitro paste. Will also start him on morphine p.r.n. and oxygen. I would also load him with Plavix and start him on Plavix daily afterwards. Would obtain an urgent Cardiology consult. It is likely the patient is experiencing progression of his cardiovascular disease. He also has a depressed EF of 35 to 45% as well. He does not appear to be in a volume overload state. Will cycle the troponins and trend. We would await further Cardiology input. This patient has had a positive stress test and the patient might benefit from cath.   In regards to his high blood pressure history, he has been having elevated blood pressure here in the ER from 170's to 140's and will start him on a low dose beta-blocker and given his LV dysfunction also a low dose ACE inhibitor and start him on a statin as well. Would also check a hemoglobin A1c and lipid profile.   TOTAL TIME SPENT: 50 minutes.   CODE STATUS: The patient is FULL CODE.    ____________________________ Vivien Presto, MD sa:drc D: 09/12/2011 08:54:52 ET T: 09/12/2011 09:21:55 ET JOB#: [id number] Vivien Presto MD ELECTRONICALLY SIGNED 09/26/2011 15:43

## 2014-05-24 ENCOUNTER — Encounter: Payer: Self-pay | Admitting: Cardiovascular Disease

## 2014-05-24 ENCOUNTER — Telehealth: Payer: Self-pay | Admitting: *Deleted

## 2014-05-24 ENCOUNTER — Telehealth: Payer: Self-pay

## 2014-05-24 DIAGNOSIS — R5383 Other fatigue: Secondary | ICD-10-CM

## 2014-05-24 DIAGNOSIS — R0683 Snoring: Secondary | ICD-10-CM

## 2014-05-24 NOTE — Telephone Encounter (Signed)
Please request ER records.  Referral placed.

## 2014-05-24 NOTE — Telephone Encounter (Signed)
[facility] records requested for most recent visit

## 2014-05-24 NOTE — Telephone Encounter (Signed)
l mom to inform pt of TCM/PH appt/sg

## 2014-05-24 NOTE — Telephone Encounter (Signed)
Pt left voicemail at triage saying that he needs a referral for a sleep study after Dr. Deborra Medina sent him to the hospital, pt request call back

## 2014-05-27 NOTE — Consult Note (Signed)
PATIENT NAME:  Gerald [name], VICTORY MR#:  [id number] DATE OF BIRTH:  1965/10/25  DATE OF CONSULTATION:  05/22/2013  REFERRING PHYSICIAN:  Albertine Patricia, MD CONSULTING PHYSICIAN:  Champ Mungo. Lovena Le, MD  INDICATION FOR CONSULTATION: Evaluation of chest pain.   HISTORY OF PRESENT ILLNESS: The patient is a very pleasant, morbidly obese 49 year old man with known nonobstructive coronary disease, hypertension and diabetes. He is dyslipidemic and obese. He underwent catheterization a couple of years ago which demonstrated a 75% mid LAD lesion. The patient normally does well and continues to work on a regular basis. Previously he has had a positive stress test. He was in his usual state of health until last night, when he developed substernal chest pain radiating to the left arm associated with nausea. It was a sharp pain and associated with diaphoresis and shortness of breath. There was a questionable pleuritic component. He presented to the Emergency Room where he was given nitroglycerin without relief. He had no acute changes on his EKG. His initial blood pressure was quite elevated at 186/112. He is admitted for additional evaluation. His pain has resolved. His initial cardiac enzymes were unremarkable, as was his EKG. It is notable that he has had no syncope and normally is able to work and without chest pain and shortness of breath. His pain has now resolved. The patient does have an exertional component to his pain as well.   PAST MEDICAL HISTORY:  As noted in history of present illness.   FAMILY HISTORY: Notable for coronary artery disease and stroke.   SOCIAL HISTORY:  The patient denies tobacco use. He rarely drinks alcoholic beverages. He is working.   ALLERGIES:  HE GIVES A HISTORY OF ALLERGY TO PANTOPRAZOLE.   REVIEW OF SYSTEMS: All systems were reviewed and negative except as noted in the history of present illness.   ON PHYSICAL EXAMINATION:  GENERAL:  He is pleasant, well-appearing,  morbidly obese, middle-aged man in no distress.  VITAL SIGNS: Blood pressure was 115/70. The pulse was 72 and regular. The saturation was 98%, total respirations were 18, temperature 98.  HEENT: Normocephalic, atraumatic. Pupils equal and round. The oropharynx was moist. Sclerae anicteric.  NECK: No jugular venous distention. There is no thyromegaly. Trachea is midline. The carotids are 2+ and symmetric. JVD could not be assessed secondary to his obesity.  LUNGS: Revealed no wheezes, rales or rhonchi. Notably his palpitation of the left chest would reproduce his pain.  CARDIOVASCULAR: Revealed a regular rate and rhythm.  His heart sounds were distant. There is normal S1 and S2. I did not appreciate an S3 or an S4 gallop.  His PMI was nondisplaced.  ABDOMEN: Obese, nontender, nondistended. There is no organomegaly. Bowel sounds are present. No rebound or guarding.  EXTREMITIES: Demonstrated trace to 1+ peripheral edema bilaterally. There is no clubbing.  SKIN: Normal.  NEURO: Alert and oriented x 3.  Cranial nerves intact. Strength is 5 out of 5 and symmetric.   RESULTS: EKG demonstrates sinus rhythm with poor R wave progression, otherwise negative. Chest x-ray demonstrates mild cardiac enlargement but no active pulmonary disease.   IMPRESSION:  Chest pain. The patient's chest pain is atypical for coronary artery disease but he has multiple cardiac risk factors. He has some typical components with his chest pain.   RECOMMENDATIONS:  Would recommend checking serial cardiac enzymes. Stress testing versus catheterization were discussed. I suspect a stress test would be reasonable as an initial evaluation approach. He has multiple risk factors for ischemic  heart disease.   ____________________________ Champ Mungo. Lovena Le, MD gwt:cs D: 05/22/2013 14:26:05 ET T: 05/22/2013 15:36:48 ET JOB#: [id number]  cc: Champ Mungo. Lovena Le, MD, <Dictator> Muhammad A. Fletcher Anon, MD Dr. Cristopher Peru, M.D. ELECTRONICALLY SIGNED  07/07/2013 15:10

## 2014-05-27 NOTE — H&P (Signed)
PATIENT NAME:  Gerald [name], Gerald [name] MR#:  [id number] DATE OF BIRTH:  February 11, 1965  DATE OF ADMISSION:  05/22/2013  REFERRING PHYSICIAN: Harvest Dark, MD  PRIMARY CARE PHYSICIAN: [facility].  PRIMARY CARDIOLOGIST: Kathlyn Sacramento, MD at [facility].   CHIEF COMPLAINT: Chest pain and shortness of breath.   HISTORY OF PRESENT ILLNESS: This is a 49 year old male with known history of hypertension, obesity, hyperlipidemia, diabetes mellitus, with a known history of mild coronary artery disease, 75% mid LAD lesion, who presents with complaints of chest pain and shortness of breath. The patient reports he has been having occasional chest pain for the last few weeks. He reports yesterday night at 10 p.m., he had chest pain at rest, midsternal, radiating to the left arm. He described it initially as sharp quality then as a pressure quality, it was accompanied by shortness of breath, by nausea and vomiting and diaphoresis as well. The patient received 3 sublingual nitroglycerin without such relief, as well he was given 324 mg of aspirin. In the ED, upon presentation, the patient's blood pressure was slightly elevated at 186/112. The patient reports he had a similar episode of chest pain last week, it was upon climbing of stairs and resolved upon rest. Hospitalist service is requested to admit the patient for further evaluation.   PAST MEDICAL HISTORY: 1. Coronary artery disease.  2. History of positive stress test.  3. Hypertension.  4. Obesity.  5. Hyperlipidemia.  6. Diabetes mellitus.   FAMILY HISTORY: Significant for coronary artery disease and MI in the family, stomach cancer in his father and history of CVA in the family as well.   SOCIAL HISTORY: No tobacco. Occasional alcohol. No drugs.   ALLERGIES: PANTOPRAZOLE.   HOME MEDICATIONS: 1. Aspirin 81 mg oral daily.  2. Gemfibrozil 600 mg 2 times a day.  3. Lisinopril 5 mg daily.  4. Escitalopram 20 mg oral daily.  5.  Metformin 100 mg oral 2 times a day.  6. Metoprolol succinate extended release 25 mg oral daily.  7. Lasix 20 mg oral daily.   REVIEW OF SYSTEMS: CONSTITUTIONAL: Denies fever, chills, fatigue, weakness, weight gain or weight loss.  EYES: Denies blurry vision, double vision, inflammation, glaucoma.  ENT: Denies tinnitus, ear pain, hearing loss, epistaxis or discharge.  RESPIRATORY: Denies cough, wheezing, hemoptysis. Reports shortness of breath.  CARDIOVASCULAR: Reports chest pain and edema. Denies palpitations, syncope.  GASTROINTESTINAL: Reports nausea, vomiting. Denies diarrhea, abdominal pain, hematemesis, melena.  GENITOURINARY: Denies dysuria, hematuria, renal colic.  ENDOCRINE: Denies polyuria, polydipsia, heat or cold intolerance.  HEMATOLOGY: Denies anemia, easy bruising, bleeding diathesis.  INTEGUMENTARY: Denies acne, rash or skin lesion.  MUSCULOSKELETAL: Denies any arthritis, gout, back pain.  NEUROLOGIC: Denies any history of CVA, TIA, tremors, vertigo, focal deficits, tingling, numbness.  PSYCHIATRIC: Denies any substance abuse, alcohol abuse, any anxiety, insomnia or depression.   PHYSICAL EXAMINATION: VITAL SIGNS: Temperature 98.1, pulse 69, blood pressure 110/66, saturating 97% on room air.  GENERAL: Well-nourished male, looks comfortable and in no apparent distress.  HEENT: Head is atraumatic, normocephalic. Pupils equal, reactive to light. Pink conjunctivae. Anicteric sclerae. Moist oral mucosa.  NECK: Supple. No thyromegaly. No JVD.  CHEST: Good air entry bilaterally. No wheezing, rales or rhonchi. Has reproducible chest pain on palpation on the left side and midsternal.  CARDIOVASCULAR: S1, S2 heard. No rubs, murmur or gallops. PMI is nondisplaced.  ABDOMEN: Obese, soft, nontender, nondistended. Bowel sounds present.  EXTREMITIES: Has +1 edema bilaterally. Pedal and radial pulses +2 bilaterally. No  clubbing. No cyanosis.  SKIN: Normal skin turgor. Warm and dry.   PSYCHIATRIC: Appropriate affect. Awake, alert, oriented x3. Intact judgment and insight.  NEUROLOGIC: Cranial nerves grossly intact. Motor 5/5. No focal deficits.  SKIN: Normal skin turgor. Warm and dry.  MUSCULOSKELETAL: No joint effusion or erythema.  LYMPHATIC: No cervical lymphadenopathy could be appreciated.   PERTINENT LABORATORIES: Glucose 160. BNP 128. BUN 9, creatinine 1.05, sodium 136, potassium 3.5, chloride 102, CO2 of 27. Troponin less than 0.02. White blood cell 8.8, hemoglobin 14.1, hematocrit 42.3, platelets 245.   EKG showing normal sinus rhythm at 77 beats per minute without any significant ST or T-wave abnormalities.   IMAGING: Chest x-ray showing shallow inspiration with mild cardiac enlargement. No active pulmonary disease.   ASSESSMENT AND PLAN: 1. Chest pain, appears to be pleuritic chest pain as it is worsened by deep inspiration and by movement. We will need to rule out pulmonary embolism, so we will obtain CT chest with IV contrast, as well we will to evaluate for cardiac origin of his chest pain, especially with his history, so he will be admitted to telemetry unit. We will continue to cycle his cardiac enzymes and follow the trend. We will start him on nitroglycerin paste, already given aspirin. Will consult cardiology, Dr. Fletcher Anon, who is familiar with the patient to see if there is any further workup indicated at this point, especially it is Sunday and we will not be able to do a stress test for the patient. We will continue to cycle his cardiac enzymes as well.  2. History of coronary artery disease. The patient is on aspirin, lisinopril, gemfibrozil and metoprolol.  3. Hyperlipidemia. Continue with his gemfibrozil.  4. Hypertension. Blood pressure is acceptable now, improved since he came to the ED. We will continue metoprolol and lisinopril. We will add nitroglycerin paste as well.  5. Diabetes mellitus. We will hold his metformin, especially he is anticipated to  take IV contrast. We will start him on insulin sliding scale.  6. Deep vein thrombosis prophylaxis. Sequential compression device and TED hose. It depends on the workup if the patient will need the treatment versus prophylaxis dose anticoagulation.   CODE STATUS: FULL CODE.   TOTAL TIME SPENT ON ADMISSION AND PATIENT CARE: 50 minutes.    ____________________________ Albertine Patricia, MD dse:dr D: 05/22/2013 02:51:17 ET T: 05/22/2013 04:46:32 ET JOB#: [id number]  cc: Albertine Patricia, MD, <Dictator> Zeta Bucy Graciela Husbands MD ELECTRONICALLY SIGNED 05/28/2013 1:00

## 2014-05-27 NOTE — Discharge Summary (Signed)
PATIENT NAME:  Gerald [name], Gerald [name] MR#:  [id number] DATE OF BIRTH:  03-Mar-1965  DATE OF ADMISSION:  05/22/2013 DATE OF DISCHARGE:  05/23/2013  PRESENTING COMPLAINT: Chest pain.   DISCHARGE DIAGNOSES:  1.  Chest pain, appears atypical.  2.  Gastritis.  3.  Suspected musculoskeletal chest pain.  4.  Hypertension.  5.  Morbid obesity.   CONDITION ON DISCHARGE: Fair.   CODE STATUS: Full code.   MEDICATIONS:  1.  Aspirin 81 mg daily.  2.  Metoprolol ER 25 mg p.o. daily.  3.  Citalopram 20 mg daily.  4.  Gemfibrozil 600 mg b.i.d.  5.  Metformin 500 mg b.i.d.  6.  Lasix 20 mg daily.  7.  Lisinopril 5 mg b.i.d.  8.  Famotidine 20 mg b.i.d.  9.  Zofran 4 mg 1 tablet 4 times a day as needed for nausea.   FOLLOWUP: With Dr. Fletcher Anon in 10 days.   Cardiac enzymes x 3 negative. CBC and basic metabolic panel within normal limits.   CARDIOLOGY CONSULTATION: With Dr. Fletcher Anon and Dr. Lovena Le.   BRIEF SUMMARY OF HOSPITAL COURSE: [name] is a 49 year old morbidly obese Caucasian gentleman with history of hypertension, diabetes and CAD, who comes in with:  1.  Chest pain, which appeared atypical per cardiology evaluation. His cardiac enzymes remained negative. EKG did not show anything acute. CTA showed no PE. The patient was seen by Dr. Fletcher Anon, who recommended no acute work-up needed and will be followed up as outpatient. His home meds were continued. 2.  Nausea with epigastric pain. Could be due to gastritis. The patient was started on Pepcid before meals b.i.d.   Overall, he remained relatively stable. He is being discharged to home in stable condition.  TIME SPENT: 40 minutes.  ____________________________ Hart Rochester Posey Pronto, MD sap:aw D: 05/24/2013 07:04:18 ET T: 05/24/2013 07:30:52 ET JOB#: [id number]  cc: Terriona Horlacher A. Posey Pronto, MD, <Dictator> Muhammad A. Fletcher Anon, MD DR. Linzie Collin MD ELECTRONICALLY SIGNED 05/30/2013 9:10

## 2014-06-04 NOTE — H&P (Signed)
PATIENT NAME:  Gerald [name], Gerald [name] MR#:  [id number] DATE OF BIRTH:  12/03/1965  DATE OF ADMISSION:  05/22/2014  PRIMARY CARE PHYSICIAN:  [facility].   REFERRING PHYSICIAN:  Dr. Edd Fabian.    CHIEF COMPLAINT:  Chest pain for two weeks, worsening one day.   HISTORY OF PRESENT ILLNESS:  A 49 year old Caucasian male with a history of hypertension, diabetes, and CAD, presented to the ED with the above chief complaint.  The patient is alert, awake, oriented, in no acute distress.  The patient has had chest pain on and off for the past two weeks and worsening today.  He describes chest pain as pressure-like in the substernal area radiating to the left arm.  The patient also complains of nausea, vomiting, and diaphoresis.  In addition, the patient complains of a headache, dizziness, and generalized weakness.  He also complains of bilateral leg swelling but denies any cough, sputum, shortness of breath, or hemoptysis; denies any orthopnea, nocturnal dyspnea.  The patient has some stress from family and work recently.  He has depression and anxiety but no suicidal ideation.   PAST MEDICAL HISTORY:  CAD, hypertension, diabetes, hyperlipidemia, obesity, CHF.   SOCIAL HISTORY:  No smoking, alcohol drinking, or illicit drugs.   PAST SURGICAL HISTORY:  Right knee surgery, cardiac catheterization in 2013.   FAMILY HISTORY:  CAD, MI, stomach cancer in his father, history of CVA in his family, and mother has breast cancer.   ALLERGIES:  TO PANTOPRAZOLE.   HOME MEDICATIONS:  Potassium 20 mEq p.o. daily, Lopressor 25 mg p.o. daily, metformin 500 mg p.o. b.i.d., lisinopril 5 mg p.o. daily, Lasix 20 mg p.o. daily, aspirin 81 mg p.o. daily.   REVIEW OF SYSTEMS:  CONSTITUTIONAL:  The patient denies any fever or chills but has a headache, dizziness, generalized weakness, and poor oral intake.   EYES:  No double vision or blurred vision.  EARS, NOSE, AND THROAT:  No postnasal drip, slurred speech, or dysphagia.   CARDIOVASCULAR:  Positive for chest pain.  No palpitations.  No orthopnea or nocturnal dyspnea, but has leg edema.  PULMONARY:  No cough, sputum, shortness of breath, or hemoptysis.  GASTROINTESTINAL:  No abdominal pain, nausea, vomiting, or diarrhea.  No melena or bloody stool.  GENITOURINARY:  No dysuria, hematuria, or incontinence.  SKIN:  No rash or jaundice.  NEUROLOGIC:  No syncope, loss of consciousness, or seizure.  ENDOCRINOLOGIC:  No polyuria, polydipsia, heat or cold intolerance.  HEMATOLOGIC:  No easy bleeding or bleeding.   PHYSICAL EXAMINATION:  VITAL SIGNS:  Temperature 98.4, blood pressure 158/83, pulse 85, O2 saturation 97% on room air.  GENERAL:  The patient is alert, awake, oriented, in no acute distress.  HEENT:  Pupils round, equal, reactive to light and accommodation.  Moist oral mucosa.  Clear oropharynx.  NECK:  Supple.  No JVD or carotid bruit.  No lymphadenopathy.  No thyromegaly.  CARDIOVASCULAR:  S1 and S2.  Regular rate and rhythm.  No murmurs or gallops.  PULMONARY:  Bilateral air entry.  No wheezing or rales.  No use of accessory muscles to breathe.   ABDOMEN:  Soft.  No distention or tenderness.  No organomegaly.  Bowel sounds present. Obese.  EXTREMITIES:  Bilateral lower extremity 1+ edema.  No clubbing or cyanosis.  No calf tenderness.  Bilateral pedal pulses present.  SKIN:  No rash or jaundice.  NEUROLOGIC:  A and O x 3.  No focal deficit.  Power 5/5.  Sensation intact.  LABORATORY DATA:  CT angiogram of chest did not show any PE or dissection; mild cardiomyopathy.  Chest x-ray:  No active cardiopulmonary disease.  Troponin less than 0.03. CBC in normal range.  Glucose 168, BUN 16, creatinine 0.99, electrolytes normal.  EKG showed normal sinus rhythm at 77 BPM.   IMPRESSIONS:  1. Chest pain with a history of coronary artery disease.  Need to rule out acute coronary syndrome.  2. Hypertension.  3. Diabetes.  4. Morbid obesity.  5. Hyperlipidemia.    6. Depression and anxiety.   PLAN OF TREATMENT:  1. The patient will be placed for observation.  We will continue telemonitoring.  The patient was treated with aspirin 325 mg in the ED.  We will continue the same dose of aspirin and start nitroglycerin p.r.n. and start statin.   2. For hypertension, continue lisinopril and Lopressor.  3. For chronic systolic CHF with ejection fraction 35% to 40%, continue Lasix.  4. In addition, I will get a cardiology consult from Dr. Fletcher Anon, followup troponin level.  5. For diabetes, we will start sliding scale, hold metformin.   I discussed the patient's condition and plan of treatment with the patient and the patient's wife.   CODE STATUS:  Patient wants full code.   TIME SPENT:  About 55 minutes.    ____________________________ Demetrios Loll, MD qc:kc D: 05/22/2014 21:28:11 ET T: 05/22/2014 22:24:15 ET JOB#: [id number]  cc: Demetrios Loll, MD, <Dictator> Demetrios Loll MD ELECTRONICALLY SIGNED 05/24/2014 17:25

## 2014-06-04 NOTE — Discharge Summary (Signed)
PATIENT NAME:  Gerald [name], ICE MR#:  [id number] DATE OF BIRTH:  1965/06/21  DATE OF ADMISSION:  05/22/2014 DATE OF DISCHARGE:  05/23/2014  ADMISSION DIAGNOSIS:  Chest pain.   DISCHARGE DIAGNOSES:   1. Chest pain, likely musculoskeletal in nature.  2. Hypertension.  3. Likely sleep apnea.  4. Type 2 diabetes.   CONSULTATIONS:  Dr. Rockey Situ.   PROCEDURES:  The patient underwent a cardiac catheterization which showed right dominant coronary system with nonobstructive coronary artery disease, 30% proximal-to-mid LAD disease.  No significant progression when compared to films from 2014.  Ejection fraction was 55%.   PERTINENT LABORATORIES:  Troponins x 3 were negative.   HOSPITAL COURSE:  A 49 year old male with known history of nonobstructive coronary artery disease who presented with chest pain.  For details, please refer to the H and P.   1. Chest pain.  The patient was significantly tender to palpation at the chest wall.  However, he underwent a cardiac workup, including troponins which were negative, telemetry monitoring which was negative for acute changes, and then underwent a cardiac catheterization by Dr. Rockey Situ which showed no significant change from his previous cardiac catheterization in 2014.  It showed basically a normal ejection fraction of 55% with nonobstructive coronary artery disease.  He continues to have pain in his chest wall which is again significant for tenderness on palpation and thought to be secondary to musculoskeletal issues.  He also underwent a CT angiogram to rule out dissection which was negative for dissection and pulmonary emboli.  2. Hypertension.  The patient was continued on his outpatient medications.  He is encouraged to get close followup for his hypertension.  3. Type 2 diabetes.  The patient will continue his outpatient medications.  4. Probable OSA.  The patient has signs and symptoms of OSA.  He needs an outpatient sleep study.   DISCHARGE  MEDICATIONS: 1. Aspirin 81 mg daily.  2. Lasix 20 mg daily.  3. Lisinopril 5 mg daily. 4. Potassium chloride 20 mEq daily.  5. Metoprolol 25 daily.  6. Metformin 500 b.i.d.  7. Nitroglycerin sublingual 0.4 mg q. 5 minutes p.r.n. chest pain.  8. Metformin will be resumed on 05/25/2014 as the patient did have a cardiac catheterization on 05/23/2014.     DISCHARGE DIET:  Low sodium, ADA diet.   DISCHARGE ACTIVITY:  As tolerated.  DISCHARGE FOLLOWUP:  Patient will follow up with cardiology in one week and his primary care physician in one week.   CONDITION:  Patient was stable for discharge.    TIME SPENT:  Forty minutes.    ____________________________ Donell Beers. Benjie Karvonen, MD spm:kc D: 05/23/2014 18:54:42 ET T: 05/23/2014 21:12:52 ET JOB#: [id number]  cc: Mareta Chesnut P. Benjie Karvonen, MD, <Dictator> Minna Merritts, MD Donell Beers Keyante Durio MD ELECTRONICALLY SIGNED 05/24/2014 21:31

## 2014-06-04 NOTE — Consult Note (Signed)
General Aspect Primry Cardiologist: Dr. Fletcher Anon, MD ___________________  49 year old male with history of nonobstrutive CAD by cardiac cath in 2012 and 2013, previously documented NICM (EF 35-40%) now improved of 55-60% by cath and echo in 2014, DM2, HTN, HLD (statin intolerant), family history of premature CAD, obesity, and anxiety, who presented to [facility] on 4/18 with a one day history of chest pain.  __________________  PMH: 1. Nonobstrutive CAD by cardiac cath in 2012 and 2013 2. Previously documented NICM (EF 35-40%) now improved of 55-60% by cath and echo in 2014 3. DM2 4. HTN 5. HLD (satin intolerant) 6. Family history of premature CAD 7. Obesity  8. Anxiety ____________________   Present Illness 49 year old male with the above problem list who presented to [facility] on 4/18 with the above CC.  He has had two prior cardiac catheterizations performed for atypical chest pain in August of 2012 & 2013. The latter study revealed tubular 30% LAD, 40% distal LAD, 30% mid LCx, 40% mid RCA stenoses; EF 60%. He transitioned cardiac care to Dr. Fletcher Anon 05/2012. He was briefly hospitalized at [facility] for atypical chest pain (sharp, reproducible on palpation) in 06/2012. He did have an episode of syncope while bending over which induced an episode of chest pain, felt to be vasovagal. He ruled out overnight. D-dimer returned WNL. 2D echo revealed mild LVH, LVEF 55-60%. He was diagnosed with new onset type 2 DM, and discharged on metformin. No further ischemic work-up indicated.   He was admitted to [facility] 05/2013 for atypial chest pain. Ruled out. He wsa advised to have close outpatient follow up, he did not follow up.   He presented to [facility] on 4/18 with complaints of one day history of worsening intermittent left-sided chest pain. Associated fatigue, SOB, diaphoresis, nausea, vomting, and palptiations. States has not been able to do anything, then his mother states later in [place] "didn't you get SOB while  you were trimming my hedges yesterday." It was also reported he has been out on the yard helping his son, though at other times not able to do anything. States "I know something is wrong and I want it fixed." Notes radiation down his entire left side, including "my left leg." + snoring.   Still with chest pain. The patient, his wife, and his mother all want him to have a cardiac cath.   Upon his arrival to [facility] he was found to have troponin negative x 3. CTA chest negative for dissection, aneurysm, and PE. CXR negative. Blood pressure has been in the 027X-412I systolic. Echo has been ordered.   Physical Exam:  GEN no acute distress, obese, anxious   HEENT PERRL, hearing intact to voice, moist oral mucosa   NECK supple  trachea midline  difficult to assess 2/2 body habitus   RESP normal resp effort  clear BS   CARD Regular rate and rhythm  No murmur   ABD denies tenderness  soft  obese   EXTR negative edema   SKIN normal to palpation   NEURO cranial nerves intact   PSYCH alert, anxious   Review of Systems:  Subjective/Chief Complaint Chest pain   General: Fatigue  Weakness   Skin: No Complaints   ENT: No Complaints   Eyes: No Complaints   Neck: No Complaints   Respiratory: Short of breath   Cardiovascular: Chest pain or discomfort  Palpitations  Edema   Gastrointestinal: Nausea   Genitourinary: No Complaints   Vascular: No Complaints  Musculoskeletal: Muscle or joint pain   Neurologic: Dizzness  Headache   Hematologic: No Complaints   Endocrine: No Complaints   Psychiatric: Nervousness  Anxiety   Review of Systems: All other systems were reviewed and found to be negative   Medications/Allergies Reviewed Medications/Allergies reviewed   Family & Social History:  Family and Social History:  Family History Coronary Artery Disease  Father: CAD s/p MI, stomach CA, Mother; HTN, breast CA   Social History negative tobacco, negative ETOH, negative  Illicit drugs   Place of Living Home  lives with wife and son     CHF:    erectile dysfunction:    HTN:    Hypercholesterolemia:    Knee Surgery - Right:   Home Medications: Medication Instructions Status  aspirin 81 mg oral tablet 1 tab(s) orally once a day (in the morning) Active  furosemide 20 mg oral tablet 1 tab(s) orally once a day (in the morning) Active  lisinopril 5 mg oral tablet 1 tab(s) orally once a day (in the morning) Active  potassium chloride 20 mEq oral tablet, extended release 1 tab(s) orally once a day (in the morning) Active  metoprolol succinate 25 mg oral tablet, extended release 1 tab(s) orally once a day (in the morning) Active  metFORMIN 500 mg oral tablet 1 tab(s) orally 2 times a day Active   Lab Results:  Routine Chem:  18-Apr-16 16:09   Glucose, Serum  168 (65-99 NOTE: New Reference Range  04/11/14)  BUN 16 (6-20 NOTE: New Reference Range  04/11/14)  Creatinine (comp) 0.99 (0.61-1.24 NOTE: New Reference Range  04/11/14)  Sodium, Serum 137 (135-145 NOTE: New Reference Range  04/11/14)  Potassium, Serum 3.9 (3.5-5.1 NOTE: New Reference Range  04/11/14)  Chloride, Serum 104 (101-111 NOTE: New Reference Range  04/11/14)  CO2, Serum 26 (22-32 NOTE: New Reference Range  04/11/14)  Calcium (Total), Serum 9.1 (8.9-10.3 NOTE: New Reference Range  04/11/14)  Anion Gap 7  eGFR (African American) >60  eGFR (Non-African American) >60 (eGFR values <14m/min/1.73 m2 may be an indication of chronic kidney disease (CKD). Calculated eGFR is useful in patients with stable renal function. The eGFR calculation will not be reliable in acutely ill patients when serum creatinine is changing rapidly. It is not useful in patients on dialysis. The eGFR calculation may not be applicable to patients at the low and high extremes of body sizes, pregnant women, and vegetarians.)  Cardiac:  18-Apr-16 16:09   Troponin I <0.03 (0.00-0.03 0.03 ng/mL or less:  NEGATIVE  Repeat testing in 3-6 hrs  if clinically indicated. >0.05 ng/mL: POTENTIAL  MYOCARDIAL INJURY. Repeat  testing in 3-6 hrs if  clinically indicated. NOTE: An increase or decrease  of 30% or more on serial  testing suggests a  clinically important change NOTE: New Reference Range  04/11/14)    22:30   Troponin I <0.03 (0.00-0.03 0.03 ng/mL or less: NEGATIVE  Repeat testing in 3-6 hrs  if clinically indicated. >0.05 ng/mL: POTENTIAL  MYOCARDIAL INJURY. Repeat  testing in 3-6 hrs if  clinically indicated. NOTE: An increase or decrease  of 30% or more on serial  testing suggests a  clinically important change NOTE: New Reference Range  04/11/14)  19-Apr-16 02:25   Troponin I <0.03 (0.00-0.03 0.03 ng/mL or less: NEGATIVE  Repeat testing in 3-6 hrs  if clinically indicated. >0.05 ng/mL: POTENTIAL  MYOCARDIAL INJURY. Repeat  testing in 3-6 hrs if  clinically indicated. NOTE: An increase or decrease  of 30%  or more on serial  testing suggests a  clinically important change NOTE: New Reference Range  04/11/14)  Routine Hem:  18-Apr-16 16:09   WBC (CBC) 10.6  RBC (CBC) 5.01  Hemoglobin (CBC) 14.5  Hematocrit (CBC) 43.0  Platelet Count (CBC) 230 (Result(s) reported on 22 May 2014 at 04:49PM.)  MCV 86  MCH 28.9  MCHC 33.7  RDW 14.5   EKG:  EKG Interp. by me   Interpretation EKG shows NSR, 77 bpm, TWI III   Radiology Results: XRay:    18-Apr-16 16:38, Chest PA and Lateral  Chest PA and Lateral   REASON FOR EXAM:    chest pain  COMMENTS:       PROCEDURE: DXR - DXR CHEST PA (OR AP) AND LATERAL  - May 22 2014  4:38PM     CLINICAL DATA:  Chest pain    EXAM:  CHEST  2 VIEW    COMPARISON:  05/21/2013    FINDINGS:  Cardiomediastinal silhouette is stable. No acute infiltrate or  pleural effusion. No pulmonary edema. Mild degenerative changes  lower thoracic spine.     IMPRESSION:  No active cardiopulmonary disease.      Electronically  Signed    By: Lahoma Crocker M.D.    On: 05/22/2014 16:47         Verified By: Ephraim Hamburger, M.D.,    pantoprazole: GI Distress  Simvastatin: Hives, Itching  Vital Signs/Nurse's Notes: **Vital Signs.:   19-Apr-16 04:35  Vital Signs Type Routine  Temperature Temperature (F) 97  Celsius 36.1  Temperature Source oral  Pulse Pulse 69  Respirations Respirations 21  Systolic BP Systolic BP 920  Diastolic BP (mmHg) Diastolic BP (mmHg) 89  Mean BP 112  Pulse Ox % Pulse Ox % 95  Pulse Ox Activity Level  At rest  Oxygen Delivery Room Air/ 21 %    Impression 49 year old male with history of nonobstrutive CAD by cardiac cath in 2012 and 2013, previously documented NICM (EF 35-40%) now improved of 55-60% by cath and echo in 2014, DM2, HTN, HLD (statin intolerant), family history of premature CAD, obesity, and anxiety, who presented to [facility] on 4/18 with a one day history of chest pain.  1. Chest pain: -Unstable angina vs GERD vs MSK vs anxiety -Patient and all present adult members of his family wish to proceed with cardiac cath at this time, patient is scheduled for procedure this afternoon. Risks of procedure were described to all members of the family including bleeding, bruising, infection, kidney damage, stroke, and death. They understand these risks and the patient wishes to proceed with the procedure. -Check echo, prior EF 35-40%, improved to 55-60% as of 2014 echo at [facility] -If cath continues to show nonobstructive CAD would pursue other etiologies including: GERD, MSK, and anxiety  2. Nonobstructive CAD: -Cath as above -Continue aspirin, statin intolerant (has failed two) considertrial of different statin, Toprol  3. Fatigue/SOB: -Likely multifactorial including deconditioning, obesity, untreated OSA/OHS, possible NICM (await echo) -Advised follow up with PCP  4. HTN: -Continue outpatient medications -Blood pressure has been elevated at home recently -Titrate as needed prior to  discharge  5. DM2: -SSI while inpatient -Hold metformin  6. Anixety/depression -Follow up with PCP   Electronic Signatures: Rise Mu (PA-C)  (Signed 19-Apr-16 11:47)  Authored: General Aspect/Present Illness, History and Physical Exam, Review of System, Family & Social History, Past Medical History, Home Medications, Labs, EKG , Radiology, Allergies, Vital Signs/Nurse's Notes, Impression/Plan Rockey Situ, Christia Reading (  MD)  (Signed 19-Apr-16 17:51)  Authored: General Aspect/Present Illness, Review of System, Family & Social History, Past Medical History, Home Medications, EKG , Impression/Plan  Co-Signer: General Aspect/Present Illness, Home Medications, Allergies   Last Updated: 19-Apr-16 17:51 by Ida Rogue (MD)

## 2014-06-13 ENCOUNTER — Emergency Department (HOSPITAL_COMMUNITY): Payer: 59

## 2014-06-13 ENCOUNTER — Encounter (HOSPITAL_COMMUNITY): Payer: Self-pay | Admitting: General Practice

## 2014-06-13 ENCOUNTER — Emergency Department (HOSPITAL_COMMUNITY)
Admission: EM | Admit: 2014-06-13 | Discharge: 2014-06-13 | Disposition: A | Discharge status: Home / Self Care | Payer: 59 | Attending: Emergency Medicine | Admitting: Emergency Medicine

## 2014-06-13 DIAGNOSIS — Z79899 Other long term (current) drug therapy: Secondary | ICD-10-CM | POA: Insufficient documentation

## 2014-06-13 DIAGNOSIS — R110 Nausea: Secondary | ICD-10-CM | POA: Diagnosis not present

## 2014-06-13 DIAGNOSIS — R1011 Right upper quadrant pain: Secondary | ICD-10-CM | POA: Diagnosis not present

## 2014-06-13 DIAGNOSIS — F419 Anxiety disorder, unspecified: Secondary | ICD-10-CM | POA: Diagnosis not present

## 2014-06-13 DIAGNOSIS — R51 Headache: Secondary | ICD-10-CM | POA: Diagnosis not present

## 2014-06-13 DIAGNOSIS — Z7982 Long term (current) use of aspirin: Secondary | ICD-10-CM | POA: Insufficient documentation

## 2014-06-13 DIAGNOSIS — K219 Gastro-esophageal reflux disease without esophagitis: Secondary | ICD-10-CM | POA: Insufficient documentation

## 2014-06-13 DIAGNOSIS — R079 Chest pain, unspecified: Secondary | ICD-10-CM | POA: Diagnosis present

## 2014-06-13 DIAGNOSIS — E119 Type 2 diabetes mellitus without complications: Secondary | ICD-10-CM | POA: Diagnosis not present

## 2014-06-13 DIAGNOSIS — E669 Obesity, unspecified: Secondary | ICD-10-CM | POA: Insufficient documentation

## 2014-06-13 DIAGNOSIS — R0789 Other chest pain: Secondary | ICD-10-CM | POA: Insufficient documentation

## 2014-06-13 DIAGNOSIS — I509 Heart failure, unspecified: Secondary | ICD-10-CM | POA: Diagnosis not present

## 2014-06-13 DIAGNOSIS — Z9889 Other specified postprocedural states: Secondary | ICD-10-CM | POA: Diagnosis not present

## 2014-06-13 DIAGNOSIS — I2510 Atherosclerotic heart disease of native coronary artery without angina pectoris: Secondary | ICD-10-CM | POA: Insufficient documentation

## 2014-06-13 DIAGNOSIS — Z8669 Personal history of other diseases of the nervous system and sense organs: Secondary | ICD-10-CM | POA: Insufficient documentation

## 2014-06-13 DIAGNOSIS — I10 Essential (primary) hypertension: Secondary | ICD-10-CM | POA: Insufficient documentation

## 2014-06-13 DIAGNOSIS — I252 Old myocardial infarction: Secondary | ICD-10-CM | POA: Insufficient documentation

## 2014-06-13 DIAGNOSIS — R0602 Shortness of breath: Secondary | ICD-10-CM | POA: Diagnosis not present

## 2014-06-13 LAB — CBC WITH DIFFERENTIAL/PLATELET
Basophils Absolute: 0 10*3/uL (ref 0.0–0.1)
Basophils Relative: 0 % (ref 0–1)
EOS ABS: 0.1 10*3/uL (ref 0.0–0.7)
Eosinophils Relative: 1 % (ref 0–5)
HEMATOCRIT: 40.3 % (ref 39.0–52.0)
Hemoglobin: 13.9 g/dL (ref 13.0–17.0)
LYMPHS ABS: 3 10*3/uL (ref 0.7–4.0)
Lymphocytes Relative: 29 % (ref 12–46)
MCH: 29.3 pg (ref 26.0–34.0)
MCHC: 34.5 g/dL (ref 30.0–36.0)
MCV: 85 fL (ref 78.0–100.0)
Monocytes Absolute: 0.6 10*3/uL (ref 0.1–1.0)
Monocytes Relative: 6 % (ref 3–12)
NEUTROS ABS: 6.8 10*3/uL (ref 1.7–7.7)
Neutrophils Relative %: 64 % (ref 43–77)
PLATELETS: 196 10*3/uL (ref 150–400)
RBC: 4.74 MIL/uL (ref 4.22–5.81)
RDW: 13.7 % (ref 11.5–15.5)
WBC: 10.4 10*3/uL (ref 4.0–10.5)

## 2014-06-13 LAB — BASIC METABOLIC PANEL
ANION GAP: 9 (ref 5–15)
BUN: 12 mg/dL (ref 6–20)
CO2: 24 mmol/L (ref 22–32)
Calcium: 8.8 mg/dL — ABNORMAL LOW (ref 8.9–10.3)
Chloride: 104 mmol/L (ref 101–111)
Creatinine, Ser: 0.95 mg/dL (ref 0.61–1.24)
Glucose, Bld: 124 mg/dL — ABNORMAL HIGH (ref 70–99)
POTASSIUM: 4 mmol/L (ref 3.5–5.1)
Sodium: 137 mmol/L (ref 135–145)

## 2014-06-13 LAB — BRAIN NATRIURETIC PEPTIDE: B Natriuretic Peptide: 47.3 pg/mL (ref 0.0–100.0)

## 2014-06-13 LAB — I-STAT TROPONIN, ED: Troponin i, poc: 0 ng/mL (ref 0.00–0.08)

## 2014-06-13 MED ORDER — NITROGLYCERIN 0.4 MG SL SUBL
0.4000 mg | SUBLINGUAL_TABLET | SUBLINGUAL | Status: DC | PRN
  Administered 2014-06-13 (×3): 0.4 mg via SUBLINGUAL
  Filled 2014-06-13: qty 1

## 2014-06-13 NOTE — ED Provider Notes (Signed)
CSN: [id number]     Arrival date & time 06/13/14  [id number] History   First MD Initiated Contact with Patient 06/13/14 [phone]     Chief Complaint  Patient presents with  . Chest Pain   Gerald [name] is a 49 y.o. male with a history of MI, hypertension, hyperlipidemia, obesity, diabetes, nonischemic cardiomyopathy and sleep apnea who presents to the ED by EMS complaining of substernal chest pain that radiates to his left chest wall since 4 am this morning. The patient reports awaking at 4 AM with substernal chest pain that is sharp and dull. He reports his pain fluctuates in intensity even while sitting. He reports his chest pain is associated with shortness of breath and that his shortness of breath is when he takes a deep breath.  He reports his pain is better sitting up and worse lying down. He currently rates his chest pain of a 7 out of 10 substernal chest. Patient reports his pain feels similar to his previous heart attacks except that he is having shortness of breath. The patient takes Lasix for CHF he reports. He reports taking 2 doses of nitroglycerin at home prior to EMS arrival. EMS provided the patient with 3 rounds of nitroglycerin, 8 mg of morphine and 324 mg of aspirin PTA. He denies any changes to his pain with nitro. He reports having previous cardiac catheterizations with no stents placed. He reports his last cardiac cath was 05/23/14 which showed 30% proximal to mid LAD disease and an EF of 55%. Patient also had a CT angiogram to rule out dissection or PE at that time. Cardiology notes and the patient report that they were thinking his pain was more musculoskeletal. The patient denies fevers, cough, abdominal pain, leg pain, leg swelling, vomiting, diarrhea, heavy lifting, rashes.  (Consider location/radiation/quality/duration/timing/severity/associated sxs/prior Treatment) HPI  Past Medical History  Diagnosis Date  . Myocardial infarction 09/2011  . Hypertension   . Hyperlipidemia   .  Obesity   . Diabetes mellitus without complication     borderline  . Nonischemic cardiomyopathy     Previous ejection fraction of 35-40% on echo. 50% on cardiac catheterization in August of 2013  . Sleep apnea   . Coronary artery disease     Mild nonobstructive coronary artery disease on previous cardiac catheterization most recently in August of 2013  . Gastritis   . Hepatic steatosis   . Abnormal LFTs   . GERD (gastroesophageal reflux disease)   . Fundic gland polyps of stomach, benign    Past Surgical History  Procedure Laterality Date  . Colonoscopy    . Cardiac catheterization N/A 09/2011    [facility]; EF 50% with 30% mid LAD stenosis and no obstructive disease.  . Cardiac catheterization  09/2010    [facility]; Mid LAD 40% stenosis; Mid Circumflex:Normal; Mid RCA; Normal  . Knee arthroscopy with medial menisectomy Right 09/16/2012    Procedure: RIGHT KNEE ARTHROSCOPY WITH MEDIAL AND LATERAL MENISECTOMY, CHONDROPLASTY;  Surgeon: Ninetta Lights, MD;  Location: [facility];  Service: Orthopedics;  Laterality: Right;  RIGHT KNEE SCOPE MEDIAL MENISCECTOMY  . Cardiac catheterization     Family History  Problem Relation Age of Onset  . Heart disease Father 46    MI  . Heart attack Father   . Stomach cancer Father   . Hypertension Mother   . Breast cancer Mother    History  Substance Use Topics  . Smoking status: Never Smoker   . Smokeless tobacco: Never  Used  . Alcohol Use: No     Comment: rare    Review of Systems  Constitutional: Negative for fever and chills.  HENT: Negative for congestion, ear pain and sore throat.   Eyes: Negative for visual disturbance.  Respiratory: Positive for shortness of breath. Negative for cough and wheezing.   Cardiovascular: Positive for chest pain. Negative for palpitations and leg swelling.  Gastrointestinal: Positive for nausea. Negative for vomiting, abdominal pain and diarrhea.  Genitourinary: Negative for dysuria.   Musculoskeletal: Negative for back pain and neck pain.  Skin: Negative for rash.  Neurological: Positive for headaches. Negative for weakness, light-headedness and numbness.  Psychiatric/Behavioral: The patient is nervous/anxious.       Allergies  Statins  Home Medications   Prior to Admission medications   Medication Sig Start Date End Date Taking? Authorizing Provider  aspirin 81 MG EC tablet Take 81 mg by mouth every evening. Swallow whole. 03/30/12  Yes Lucille Passy, MD  citalopram (CELEXA) 20 MG tablet Take 1 tablet (20 mg total) by mouth daily. 01/31/14  Yes Jearld Fenton, NP  Cyanocobalamin (VITAMIN B-12) 5000 MCG SUBL Place 2 tablets under the tongue daily.   Yes Historical Provider, MD  furosemide (LASIX) 20 MG tablet Take 1 tablet (20 mg total) by mouth daily. 11/08/13  Yes Lucille Passy, MD  lisinopril (PRINIVIL,ZESTRIL) 5 MG tablet Take 1 tablet (5 mg total) by mouth daily. 11/08/13  Yes Lucille Passy, MD  metFORMIN (GLUCOPHAGE) 500 MG tablet Take 500 mg by mouth 2 (two) times daily with a meal.   Yes Historical Provider, MD  metoprolol succinate (TOPROL-XL) 25 MG 24 hr tablet Take 1 tablet (25 mg total) by mouth daily. 11/08/13  Yes Lucille Passy, MD  nitroGLYCERIN (NITROSTAT) 0.4 MG SL tablet Place 1 tablet (0.4 mg total) under the tongue every 5 (five) minutes as needed for chest pain. 11/08/13  Yes Lucille Passy, MD  omeprazole (PRILOSEC) 40 MG capsule Take 1 capsule (40 mg total) by mouth daily. 12/27/13  Yes Irene Shipper, MD  potassium chloride SA (K-DUR,KLOR-CON) 20 MEQ tablet Take 1 tablet (20 mEq total) by mouth daily. 11/08/13  Yes Lucille Passy, MD  gemfibrozil (LOPID) 600 MG tablet Take 1 tablet (600 mg total) by mouth 2 (two) times daily before a meal. Patient not taking: Reported on 06/13/2014 11/08/13   Lucille Passy, MD  glucose blood test strip Use as instructed 05/28/12   Lucille Passy, MD  ondansetron (ZOFRAN-ODT) 4 MG disintegrating tablet  11/28/13   Historical  Provider, MD  tamsulosin (FLOMAX) 0.4 MG CAPS capsule Take 1 capsule (0.4 mg total) by mouth daily. Patient not taking: Reported on 06/13/2014 11/23/13   Lucille Passy, MD   BP 100/59 mmHg  Pulse 58  Temp(Src) 98.2 F (36.8 C) (Oral)  Resp 12  Ht 6\' 3"  (1.905 m)  Wt 329 lb (149.233 kg)  BMI 41.12 kg/m2  SpO2 94% Physical Exam  Constitutional: He appears well-developed and well-nourished. No distress.  Obese male. Nontoxic appearing.  HENT:  Head: Normocephalic and atraumatic.  Mouth/Throat: Oropharynx is clear and moist. No oropharyngeal exudate.  Eyes: Conjunctivae are normal. Pupils are equal, round, and reactive to light. Right eye exhibits no discharge. Left eye exhibits no discharge.  Neck: Normal range of motion. Neck supple. No JVD present. No tracheal deviation present.  Cardiovascular: Normal rate, regular rhythm, normal heart sounds and intact distal pulses.  Exam reveals no gallop and  no friction rub.   No murmur heard. Bilateral radial, posterior tibialis and dorsalis pedis pulses are intact.    Pulmonary/Chest: Effort normal and breath sounds normal. No respiratory distress. He has no wheezes. He has no rales. He exhibits tenderness.  Lungs are clear to auscultation bilaterally. Substernal chest wall tenderness which reproduces his chest pain.  Abdominal: Soft. Bowel sounds are normal. There is no tenderness. There is no rebound.  Musculoskeletal: He exhibits no edema or tenderness.  No lower extremity edema or tenderness.  Lymphadenopathy:    He has no cervical adenopathy.  Neurological: He is alert. Coordination normal.  Skin: Skin is warm and dry. No rash noted. He is not diaphoretic. No erythema. No pallor.  Psychiatric: He has a normal mood and affect. His behavior is normal.  Nursing note and vitals reviewed.   ED Course  Procedures (including critical care time) Labs Review Labs Reviewed  BASIC METABOLIC PANEL - Abnormal; Notable for the following:     Glucose, Bld 124 (*)    Calcium 8.8 (*)    All other components within normal limits  CBC WITH DIFFERENTIAL/PLATELET  BRAIN NATRIURETIC PEPTIDE  I-STAT TROPOININ, ED    Imaging Review Dg Chest 2 View  06/13/2014   CLINICAL DATA:  49 year old male with shortness of breath and chest pain this morning. Initial encounter.  EXAM: CHEST  2 VIEW  COMPARISON:  Chest CTA 05/22/2014 and earlier.  FINDINGS: Stable low lung volumes. Stable cardiac size and mediastinal contours. Visualized tracheal air column is within normal limits. Stable increased interstitial markings. No pneumothorax, pulmonary edema, pleural effusion or consolidation. No acute osseous abnormality identified.  IMPRESSION: Stable low lung volumes.  No acute cardiopulmonary abnormality.   Electronically Signed   By: Genevie Ann M.D.   On: 06/13/2014 11:41   US Abdomen Limited  06/13/2014   CLINICAL DATA:  49 year old male with right upper quadrant abdominal pain for 7 hrs. Initial encounter.  EXAM: US ABDOMEN LIMITED - RIGHT UPPER QUADRANT  COMPARISON:  CT Abdomen and Pelvis 01/02/2014 and earlier.  FINDINGS: Gallbladder:  Wall thickness at the upper limits of normal, 3 mm. No echogenic sludge or stones. No pericholecystic fluid, or sonographic Murphy's sign elicited.  Common bile duct:  Diameter: 5 mm, normal  Liver:  Chronically echogenic and difficult penetrate. No liver lesion or intrahepatic biliary ductal dilatation.  Other findings: Negative visible right kidney.  IMPRESSION: Chronic hepatic steatosis.  Negative gallbladder.   Electronically Signed   By: Genevie Ann M.D.   On: 06/13/2014 12:16     EKG Interpretation   Date/Time:  Tuesday Jun 13 2014 09:53:31 EDT Ventricular Rate:  69 PR Interval:  194 QRS Duration: 96 QT Interval:  427 QTC Calculation: 457 R Axis:   65 Text Interpretation:  Sinus rhythm Low voltage, precordial leads Probable  anteroseptal infarct, old Confirmed by DELO  MD, DOUGLAS ([id number]) on  06/13/2014 12:07:40  PM      Filed Vitals:   06/13/14 1200 06/13/14 1215 06/13/14 1230 06/13/14 1327  BP: 109/62 114/60 107/61 100/59  Pulse: 58 61 58 58  Temp:    98.2 F (36.8 C)  TempSrc:    Oral  Resp: 18 17 21 12   Height:      Weight:      SpO2: 97% 97% 96% 94%     MDM   Meds given in ED:  Medications  nitroGLYCERIN (NITROSTAT) SL tablet 0.4 mg (0.4 mg Sublingual Given 06/13/14 1031)    New Prescriptions  No medications on file    Final diagnoses:  Right upper quadrant abdominal pain  Chest wall pain  Chest pain, unspecified chest pain type    This is a 49 y.o. male with a history of MI, hypertension, hyperlipidemia, obesity, diabetes, nonischemic cardiomyopathy and sleep apnea who presents to the ED by EMS complaining of substernal chest pain that radiates to his left chest wall since 4 am this morning. The patient reports awaking at 4 AM with substernal chest pain that is sharp and dull. He reports his pain fluctuates in intensity even while sitting. He reports his chest pain is associated with shortness of breath and that his shortness of breath is when he takes a deep breath.  The patient received 324 ASA and 3 rounds of NTG by EMS PTA. Patient also had [place] and on arrival to the emergency department. He denies any changes to his pain is nitroglycerin. On exam the patient is afebrile and nontoxic appearing. His lungs are clear to auscultation bilaterally. He has significant substernal chest wall tenderness which reproduces his chest pain. The patient is concerned about it being his gallbladder. Dr. Stark Jock suggested ultrasound with his cardiac work up.  The patient had a recent admission for chest pain 20 days ago to [facility]. At that time he had a cardiac cath which showed 30% proximal to mid LAD disease and an ejection fraction of 55%. He also had a CT angiogram to which ruled out dissection and PE at that time. Cardiology notes indicate they believe his pain is more  musculoskeletal and anxiety versus cardiac pain. He has never had stents placed.  Today the patient has a negative troponin which is obtained greater than 6 hours from onset of his chest pain. The patient's BNP is normal. His BMP is unremarkable. His CBC is within normal limits. His chest x-ray is stable. His right upper quadrant ultrasound shows hepatic steatosis with a negative gallbladder. After discussing with my attending refill of this patient does not require further workup at this time due to recent extensive work up and significant chest wall pain. The patient reports his pain is down to 6/10. His oxygen saturation is 96% on room air. The patient reports he has a follow-up with his cardiologist in 2 days. I advised him to keep this appointment. I advised the patient to follow-up with their primary care provider this week. I advised the patient to return to the emergency department with new or worsening symptoms or new concerns. The patient verbalized understanding and agreement with plan.    This patient was discussed with and evaluated by Dr. Stark Jock who agrees with assessment and plan.      Waynetta Pean, PA-C 06/13/14 1339  Veryl Speak, MD 06/13/14 1440  Veryl Speak, MD 06/13/14 1444

## 2014-06-13 NOTE — ED Notes (Signed)
Pt brought in via [facility] with complaints of left sided chest pain that started around 4 am this morning. Pt describes pain as sharp that turns into a dull constant pain. Pt reporting difficulty breathing, sweating, and nausea. Pt denies any vomiting. Pt is A/O. Pt rates pain a 7/10 which is worse when lying flat. EMS gave pt nitro X's 3, 324 mg of ASA, and 8 mg of IV morphine.

## 2014-06-13 NOTE — ED Notes (Signed)
PA Will at the bedside   

## 2014-06-13 NOTE — ED Notes (Signed)
Pt in xray

## 2014-06-13 NOTE — Discharge Instructions (Signed)
Chest Pain (Nonspecific) °It is often hard to give a specific diagnosis for the cause of chest pain. There is always a chance that your pain could be related to something serious, such as a heart attack or a blood clot in the lungs. You need to follow up with your health care provider for further evaluation. °CAUSES  °· Heartburn. °· Pneumonia or bronchitis. °· Anxiety or stress. °· Inflammation around your heart (pericarditis) or lung (pleuritis or pleurisy). °· A blood clot in the lung. °· A collapsed lung (pneumothorax). It can develop suddenly on its own (spontaneous pneumothorax) or from trauma to the chest. °· Shingles infection (herpes zoster virus). °The chest wall is composed of bones, muscles, and cartilage. Any of these can be the source of the pain. °· The bones can be bruised by injury. °· The muscles or cartilage can be strained by coughing or overwork. °· The cartilage can be affected by inflammation and become sore (costochondritis). °DIAGNOSIS  °Lab tests or other studies may be needed to find the cause of your pain. Your health care provider may have you take a test called an ambulatory electrocardiogram (ECG). An ECG records your heartbeat patterns over a 24-hour period. You may also have other tests, such as: °· Transthoracic echocardiogram (TTE). During echocardiography, sound waves are used to evaluate how blood flows through your heart. °· Transesophageal echocardiogram (TEE). °· Cardiac monitoring. This allows your health care provider to monitor your heart rate and rhythm in real time. °· Holter monitor. This is a portable device that records your heartbeat and can help diagnose heart arrhythmias. It allows your health care provider to track your heart activity for several days, if needed. °· Stress tests by exercise or by giving medicine that makes the heart beat faster. °TREATMENT  °· Treatment depends on what may be causing your chest pain. Treatment may include: °· Acid blockers for  heartburn. °· Anti-inflammatory medicine. °· Pain medicine for inflammatory conditions. °· Antibiotics if an infection is present. °· You may be advised to change lifestyle habits. This includes stopping smoking and avoiding alcohol, caffeine, and chocolate. °· You may be advised to keep your head raised (elevated) when sleeping. This reduces the chance of acid going backward from your stomach into your esophagus. °Most of the time, nonspecific chest pain will improve within 2-3 days with rest and mild pain medicine.  °HOME CARE INSTRUCTIONS  °· If antibiotics were prescribed, take them as directed. Finish them even if you start to feel better. °· For the next few days, avoid physical activities that bring on chest pain. Continue physical activities as directed. °· Do not use any tobacco products, including cigarettes, chewing tobacco, or electronic cigarettes. °· Avoid drinking alcohol. °· Only take medicine as directed by your health care provider. °· Follow your health care provider's suggestions for further testing if your chest pain does not go away. °· Keep any follow-up appointments you made. If you do not go to an appointment, you could develop lasting (chronic) problems with pain. If there is any problem keeping an appointment, call to reschedule. °SEEK MEDICAL CARE IF:  °· Your chest pain does not go away, even after treatment. °· You have a rash with blisters on your chest. °· You have a fever. °SEEK IMMEDIATE MEDICAL CARE IF:  °· You have increased chest pain or pain that spreads to your arm, neck, jaw, back, or abdomen. °· You have shortness of breath. °· You have an increasing cough, or you cough   up blood.  You have severe back or abdominal pain.  You feel nauseous or vomit.  You have severe weakness.  You faint.  You have chills. This is an emergency. Do not wait to see if the pain will go away. Get medical help at once. Call your local emergency services (911 in [place] Do not drive  yourself to the hospital. MAKE SURE YOU:   Understand these instructions.  Will watch your condition.  Will get help right away if you are not doing well or get worse. Document Released: 10/30/2004 Document Revised: 01/25/2013 Document Reviewed: 08/26/2007 [facility] Patient Information 2015 [place], [place]. This information is not intended to replace advice given to you by your health care provider. Make sure you discuss any questions you have with your health care provider. Chest Wall Pain Chest wall pain is pain in or around the bones and muscles of your chest. It may take up to 6 weeks to get better. It may take longer if you must stay physically active in your work and activities.  CAUSES  Chest wall pain may happen on its own. However, it may be caused by:  A viral illness like the flu.  Injury.  Coughing.  Exercise.  Arthritis.  Fibromyalgia.  Shingles. HOME CARE INSTRUCTIONS   Avoid overtiring physical activity. Try not to strain or perform activities that cause pain. This includes any activities using your chest or your abdominal and side muscles, especially if heavy weights are used.  Put ice on the sore area.  Put ice in a plastic bag.  Place a towel between your skin and the bag.  Leave the ice on for 15-20 minutes per hour while awake for the first 2 days.  Only take over-the-counter or prescription medicines for pain, discomfort, or fever as directed by your caregiver. SEEK IMMEDIATE MEDICAL CARE IF:   Your pain increases, or you are very uncomfortable.  You have a fever.  Your chest pain becomes worse.  You have new, unexplained symptoms.  You have nausea or vomiting.  You feel sweaty or lightheaded.  You have a cough with phlegm (sputum), or you cough up blood. MAKE SURE YOU:   Understand these instructions.  Will watch your condition.  Will get help right away if you are not doing well or get worse. Document Released: 01/20/2005 Document  Revised: 04/14/2011 Document Reviewed: 09/16/2010 [facility] Patient Information 2015 [place], [place]. This information is not intended to replace advice given to you by your health care provider. Make sure you discuss any questions you have with your health care provider.  Abdominal Pain Many things can cause abdominal pain. Usually, abdominal pain is not caused by a disease and will improve without treatment. It can often be observed and treated at home. Your health care provider will do a physical exam and possibly order blood tests and X-rays to help determine the seriousness of your pain. However, in many cases, more time must pass before a clear cause of the pain can be found. Before that point, your health care provider may not know if you need more testing or further treatment. HOME CARE INSTRUCTIONS  Monitor your abdominal pain for any changes. The following actions may help to alleviate any discomfort you are experiencing:  Only take over-the-counter or prescription medicines as directed by your health care provider.  Do not take laxatives unless directed to do so by your health care provider.  Try a clear liquid diet (broth, tea, or water) as directed by your health  care provider. Slowly move to a bland diet as tolerated. SEEK MEDICAL CARE IF:  You have unexplained abdominal pain.  You have abdominal pain associated with nausea or diarrhea.  You have pain when you urinate or have a bowel movement.  You experience abdominal pain that wakes you in the night.  You have abdominal pain that is worsened or improved by eating food.  You have abdominal pain that is worsened with eating fatty foods.  You have a fever. SEEK IMMEDIATE MEDICAL CARE IF:   Your pain does not go away within 2 hours.  You keep throwing up (vomiting).  Your pain is felt only in portions of the abdomen, such as the right side or the left lower portion of the abdomen.  You pass bloody or black tarry  stools. MAKE SURE YOU:  Understand these instructions.   Will watch your condition.   Will get help right away if you are not doing well or get worse.  Document Released: 10/30/2004 Document Revised: 01/25/2013 Document Reviewed: 09/29/2012 [facility] Patient Information 2015 [place], [place]. This information is not intended to replace advice given to you by your health care provider. Make sure you discuss any questions you have with your health care provider.

## 2014-06-15 ENCOUNTER — Encounter: Payer: Self-pay | Admitting: Cardiovascular Disease

## 2014-07-28 ENCOUNTER — Institutional Professional Consult (permissible substitution): Payer: Self-pay | Admitting: Internal Medicine

## 2014-08-15 ENCOUNTER — Emergency Department
Admission: EM | Admit: 2014-08-15 | Discharge: 2014-08-15 | Disposition: A | Discharge status: Home / Self Care | Payer: 59 | Attending: Emergency Medicine | Admitting: Emergency Medicine

## 2014-08-15 ENCOUNTER — Encounter: Payer: Self-pay | Admitting: Emergency Medicine

## 2014-08-15 ENCOUNTER — Emergency Department: Payer: 59

## 2014-08-15 DIAGNOSIS — S83422A Sprain of lateral collateral ligament of left knee, initial encounter: Secondary | ICD-10-CM

## 2014-08-15 DIAGNOSIS — Y9289 Other specified places as the place of occurrence of the external cause: Secondary | ICD-10-CM | POA: Insufficient documentation

## 2014-08-15 DIAGNOSIS — Z7982 Long term (current) use of aspirin: Secondary | ICD-10-CM | POA: Insufficient documentation

## 2014-08-15 DIAGNOSIS — S8992XA Unspecified injury of left lower leg, initial encounter: Secondary | ICD-10-CM | POA: Diagnosis present

## 2014-08-15 DIAGNOSIS — E119 Type 2 diabetes mellitus without complications: Secondary | ICD-10-CM | POA: Insufficient documentation

## 2014-08-15 DIAGNOSIS — X58XXXA Exposure to other specified factors, initial encounter: Secondary | ICD-10-CM | POA: Diagnosis not present

## 2014-08-15 DIAGNOSIS — Y998 Other external cause status: Secondary | ICD-10-CM | POA: Diagnosis not present

## 2014-08-15 DIAGNOSIS — S86912A Strain of unspecified muscle(s) and tendon(s) at lower leg level, left leg, initial encounter: Secondary | ICD-10-CM | POA: Diagnosis not present

## 2014-08-15 DIAGNOSIS — Y9353 Activity, golf: Secondary | ICD-10-CM | POA: Diagnosis not present

## 2014-08-15 DIAGNOSIS — Z79899 Other long term (current) drug therapy: Secondary | ICD-10-CM | POA: Insufficient documentation

## 2014-08-15 DIAGNOSIS — I10 Essential (primary) hypertension: Secondary | ICD-10-CM | POA: Diagnosis not present

## 2014-08-15 NOTE — Discharge Instructions (Signed)
It appears he had a strain of the LCL. Wear the immobilizer for stability. He may take it off for gentle range of motion exercises. Follow-up with your orthopedic doctor in [place] or follow with Dr. Sabra Heck locally. He may take ibuprofen, 800 mg, 3 times a day, for discomfort. 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 Allow the need to rest and for you to have a sense of what you'll be capable of. He may return to work the following night if he feel comfortable.  Knee Sprain A knee sprain is a tear in the strong bands of tissue that connect the bones (ligaments) of your knee. HOME CARE  Raise (elevate) your injured knee to lessen puffiness (swelling).  To ease pain and puffiness, put ice on the injured area.  Put ice in a plastic bag.  Place a towel between your skin and the bag.  Leave the ice on for 20 minutes, 2-3 times a day.  Only take medicine as told by your doctor.  Do not leave your knee unprotected until pain and stiffness go away (usually 4-6 weeks).  If you have a cast or splint, do not get it wet. If your doctor told you to not take it off, cover it with a plastic bag when you shower or bathe. Do not swim.  Your doctor may have you do exercises to prevent or limit permanent weakness and stiffness. GET HELP RIGHT AWAY IF:   Your cast or splint becomes damaged.  Your pain gets worse.  You have a lot of pain, puffiness, or numbness below the cast or splint. MAKE SURE YOU:   Understand these instructions.  Will watch your condition.  Will get help right away if you are not doing well or get worse. Document Released: 01/08/2009 Document Revised: 01/25/2013 Document Reviewed: 09/28/2012 [facility] Patient Information 2015 [place], [place]. This information is not intended to replace advice given to you by your health care provider. Make sure you discuss any questions you have with your health care provider.

## 2014-08-15 NOTE — ED Notes (Signed)
MD at bedside. 

## 2014-08-15 NOTE — ED Notes (Signed)
Patient verbalized understanding of instructions given. Patient discharged with family.

## 2014-08-15 NOTE — ED Provider Notes (Signed)
[facility] Emergency Department Provider Note  ____________________________________________  Time seen: 1500  I have reviewed the triage vital signs and the nursing notes.   HISTORY  Chief Complaint Knee Pain     HPI Gerald [name] is a 49 y.o. male who was playing golf today.  At one point, during a swing, he felt the outside part of his left knee pop and felt pain. Since then he has had ongoing pain and he feels the knee is less stable. He reports he feels the knee once to move or buccal from underneath him.   The patient reports he had an injury to this knee once a long time ago, but he does not give further detail.  Of note, he is a lefty.   Past Medical History  Diagnosis Date  . Myocardial infarction 09/2011  . Hypertension   . Hyperlipidemia   . Obesity   . Diabetes mellitus without complication     borderline  . Nonischemic cardiomyopathy     Previous ejection fraction of 35-40% on echo. 50% on cardiac catheterization in August of 2013  . Sleep apnea   . Coronary artery disease     Mild nonobstructive coronary artery disease on previous cardiac catheterization most recently in August of 2013  . Gastritis   . Hepatic steatosis   . Abnormal LFTs   . GERD (gastroesophageal reflux disease)   . Fundic gland polyps of stomach, benign     Patient Active Problem List   Diagnosis Date Noted  . Other chest pain 05/22/2014  . Mass of jaw 05/22/2014  . Right flank pain 11/23/2013  . Dysuria 11/23/2013  . Encounter for wellness examination 11/08/2013  . Retaining fluid 11/08/2013  . Urinary incontinence 03/14/2013  . Depression 01/13/2013  . Type 2 diabetes mellitus 09/14/2012  . Nonischemic cardiomyopathy   . HLD (hyperlipidemia) 05/28/2012  . CAD (coronary artery disease) 03/30/2012  . Chronic pain syndrome 03/30/2012  . HTN (hypertension) 03/30/2012    Past Surgical History  Procedure Laterality Date  . Colonoscopy    . Cardiac  catheterization N/A 09/2011    [facility]; EF 50% with 30% mid LAD stenosis and no obstructive disease.  . Cardiac catheterization  09/2010    [facility]; Mid LAD 40% stenosis; Mid Circumflex:Normal; Mid RCA; Normal  . Knee arthroscopy with medial menisectomy Right 09/16/2012    Procedure: RIGHT KNEE ARTHROSCOPY WITH MEDIAL AND LATERAL MENISECTOMY, CHONDROPLASTY;  Surgeon: Ninetta Lights, MD;  Location: [facility];  Service: Orthopedics;  Laterality: Right;  RIGHT KNEE SCOPE MEDIAL MENISCECTOMY  . Cardiac catheterization      Current Outpatient Rx  Name  Route  Sig  Dispense  Refill  . aspirin 81 MG EC tablet   Oral   Take 81 mg by mouth every evening. Swallow whole.         . citalopram (CELEXA) 20 MG tablet   Oral   Take 1 tablet (20 mg total) by mouth daily.   90 tablet   0   . Cyanocobalamin (VITAMIN B-12) 5000 MCG SUBL   Sublingual   Place 2 tablets under the tongue daily.         . furosemide (LASIX) 20 MG tablet   Oral   Take 1 tablet (20 mg total) by mouth daily.   90 tablet   1   . gemfibrozil (LOPID) 600 MG tablet   Oral   Take 1 tablet (600 mg total) by mouth 2 (two) times  daily before a meal. Patient not taking: Reported on 06/13/2014   180 tablet   1   . glucose blood test strip      Use as instructed   100 each   12   . lisinopril (PRINIVIL,ZESTRIL) 5 MG tablet   Oral   Take 1 tablet (5 mg total) by mouth daily.   90 tablet   1   . metFORMIN (GLUCOPHAGE) 500 MG tablet   Oral   Take 500 mg by mouth 2 (two) times daily with a meal.         . metoprolol succinate (TOPROL-XL) 25 MG 24 hr tablet   Oral   Take 1 tablet (25 mg total) by mouth daily.   90 tablet   1   . nitroGLYCERIN (NITROSTAT) 0.4 MG SL tablet   Sublingual   Place 1 tablet (0.4 mg total) under the tongue every 5 (five) minutes as needed for chest pain.   75 tablet   1   . omeprazole (PRILOSEC) 40 MG capsule   Oral   Take 1 capsule (40 mg total) by mouth daily.   30  capsule   6   . ondansetron (ZOFRAN-ODT) 4 MG disintegrating tablet               . potassium chloride SA (K-DUR,KLOR-CON) 20 MEQ tablet   Oral   Take 1 tablet (20 mEq total) by mouth daily.   90 tablet   1   . tamsulosin (FLOMAX) 0.4 MG CAPS capsule   Oral   Take 1 capsule (0.4 mg total) by mouth daily. Patient not taking: Reported on 06/13/2014   30 capsule   0     Allergies Statins  Family History  Problem Relation Age of Onset  . Heart disease Father 34    MI  . Heart attack Father   . Stomach cancer Father   . Hypertension Mother   . Breast cancer Mother     Social History History  Substance Use Topics  . Smoking status: Never Smoker   . Smokeless tobacco: Never Used  . Alcohol Use: No     Comment: rare    Review of Systems  Constitutional: Negative for fever. ENT: Negative for sore throat. Cardiovascular: Negative for chest pain. Respiratory: Negative for shortness of breath. Gastrointestinal: Negative for abdominal pain, vomiting and diarrhea. Genitourinary: Negative for dysuria. Musculoskeletal: No myalgias or injuries. Skin: Negative for rash. Neurological: Negative for headaches   10-point ROS otherwise negative.  ____________________________________________   PHYSICAL EXAM:  VITAL SIGNS: ED Triage Vitals  Enc Vitals Group     BP --      Pulse --      Resp --      Temp --      Temp src --      SpO2 --      Weight --      Height --      Head Cir --      Peak Flow --      Pain Score --      Pain Loc --      Pain Edu? --      Excl. in [place]? --     Constitutional:  Alert and oriented. Patient appears a bit uncomfortable due to the pain in his left knee. ENT   Head: Normocephalic and atraumatic.. Cardiovascular: Normal rate, regular rhythm, no murmur noted Respiratory:  Normal respiratory effort, no tachypnea.    Breath sounds are clear  and equal bilaterally.  Gastrointestinal: Soft and nontender. No distention.  Back:  No muscle spasm, no tenderness, no CVA tenderness. Musculoskeletal: No deformity noted. The patient does have tenderness in the lateral left knee area there is no crepitus, popping or clicking, with range of motion. This includes passive motion with varus and valgus pressure. There is increased pain with valgus pressure. There is no tenderness over the patella or the medial side of the knee. The patient has some restriction in his range of motion going from 0 to approximately 75 or 80. There is no notable joint effusion. There is no deformity. Neurologic:  Normal speech and language. No gross focal neurologic deficits are appreciated.  Skin:  Skin is warm, dry. No rash noted. Psychiatric: Mood and affect are normal. Speech and behavior are normal.  ____________________________________________   Radiology: IMPRESSION: No acute findings. Mild degenerative changes involving the patella and medial joint space. ____________________________________________   INITIAL IMPRESSION / ASSESSMENT AND PLAN / ED COURSE  Pertinent labs & imaging results that were available during my care of the patient were reviewed by me and considered in my medical decision making (see chart for details).  I suspect the patient has sustained a strain or tear to his lateral collateral ligament of the left knee. We will obtain an x-ray. If negative, we will place him in a knee immobilizer. We've discussed follow-up with orthopedics.  ----------------------------------------- 4:05 PM on 08/15/2014 -----------------------------------------  X-ray of the knee was negative for acute injury. We will discharge the patient as planned with the knee immobilizer and with orthopedic follow-up.  ____________________________________________   FINAL CLINICAL IMPRESSION(S) / ED DIAGNOSES  Final diagnoses:  Sprain and strain of lateral collateral ligament of knee, left, initial encounter      Ahmed Prima, MD 08/15/14  [id number]

## 2014-08-15 NOTE — ED Notes (Signed)
Pt comes in POV c/o left knee pain on the outer side.  He was playing golf this morning and he felt pain as he took a swing at the golf ball.  States "I heard it pop".

## 2014-08-18 IMAGING — CR LEFT WRIST - COMPLETE 3+ VIEW
1 series · 4 of 4 positions shown · non-contrast
Comparison: none

REASON FOR EXAM: painful wrist
COMMENTS:

PROCEDURE:     DXR - DXR WRIST LT COMP WITH OBLIQUES  - May 14, 2012  [DATE]
RESULT:     Comparison: None.

[Series 1: pa · 0.17mm/px · 4 of 4 slices shown]
[im 1/4]
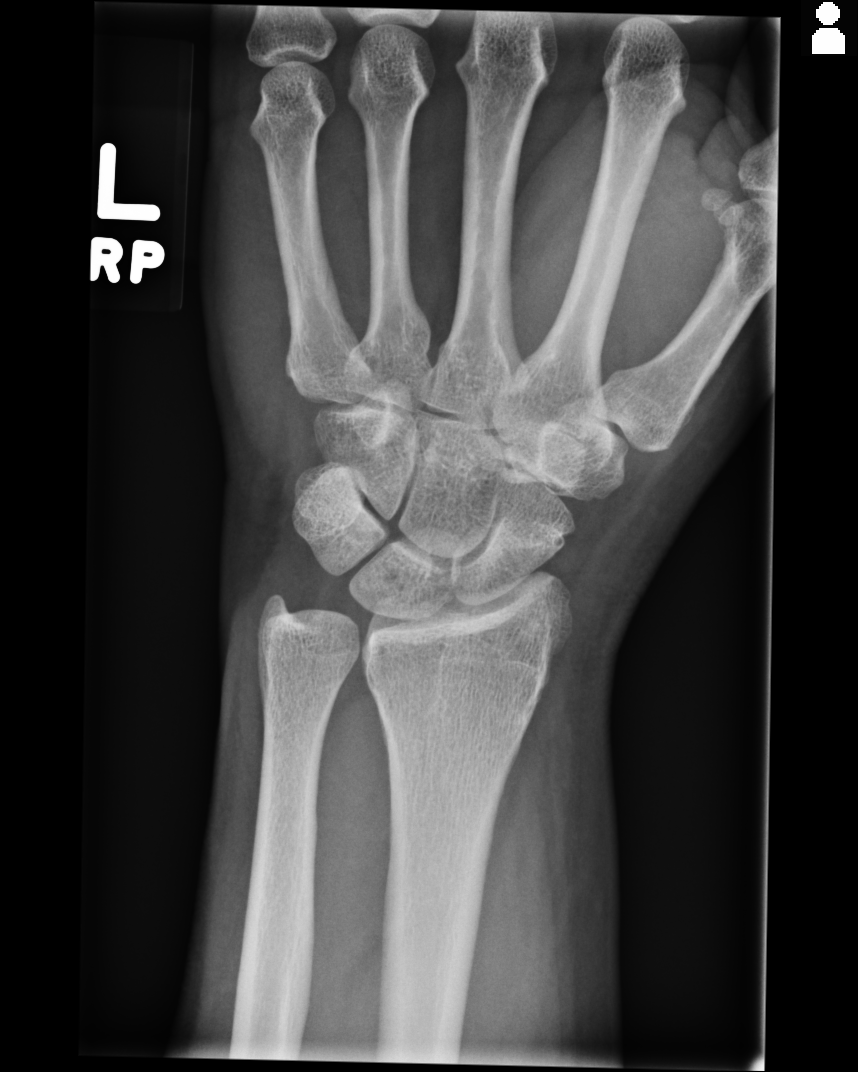
[im 2/4]
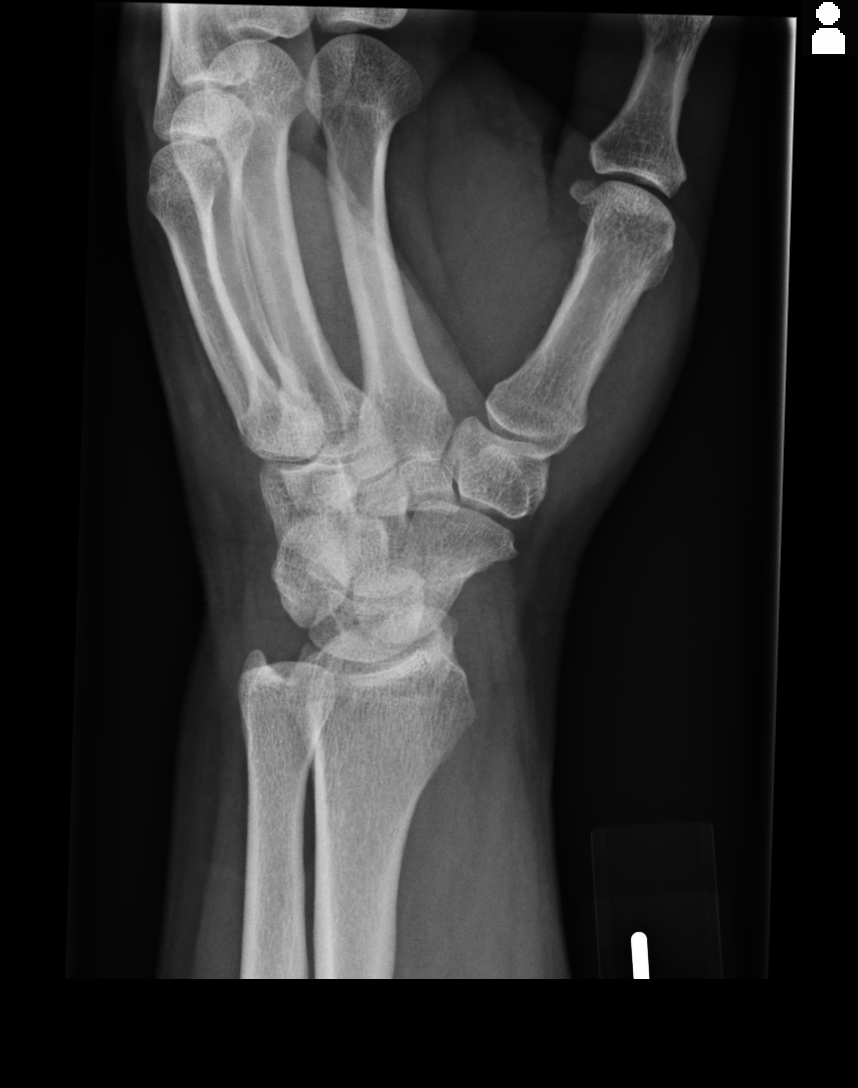
[im 3/4]
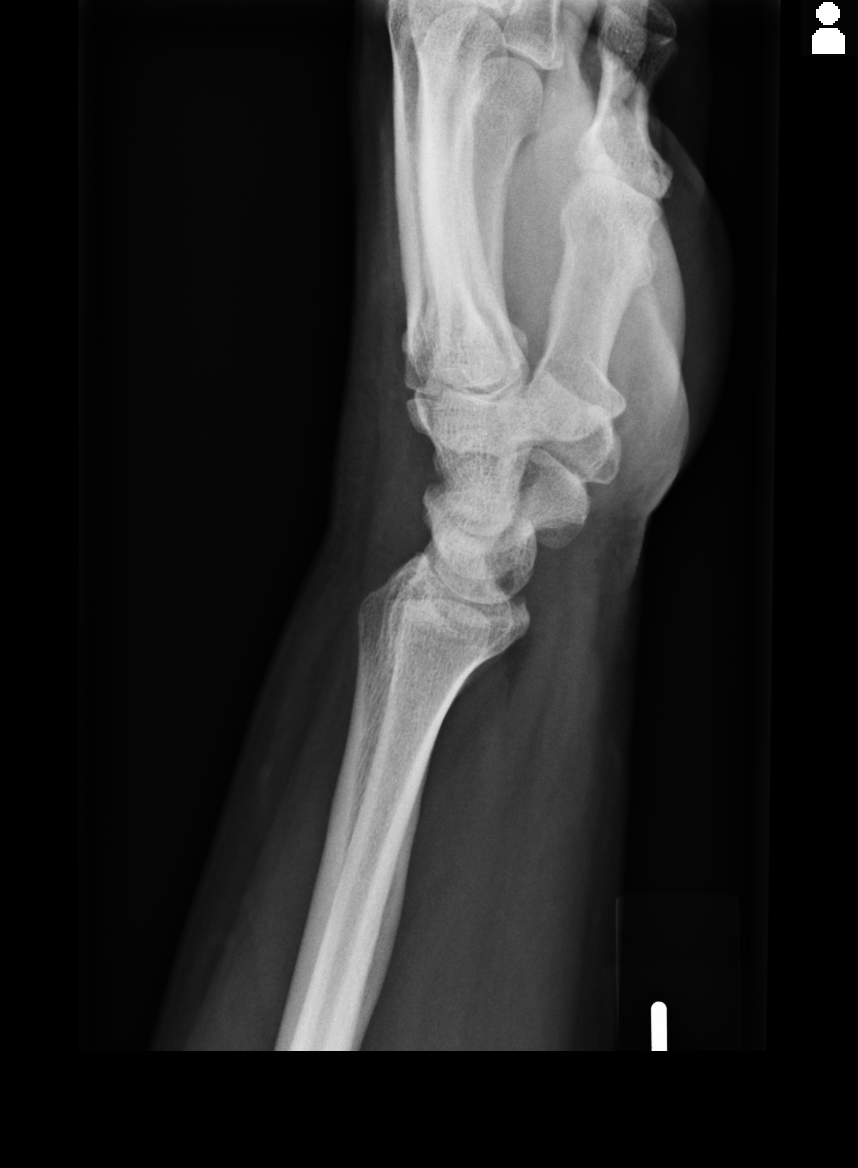
[im 4/4]
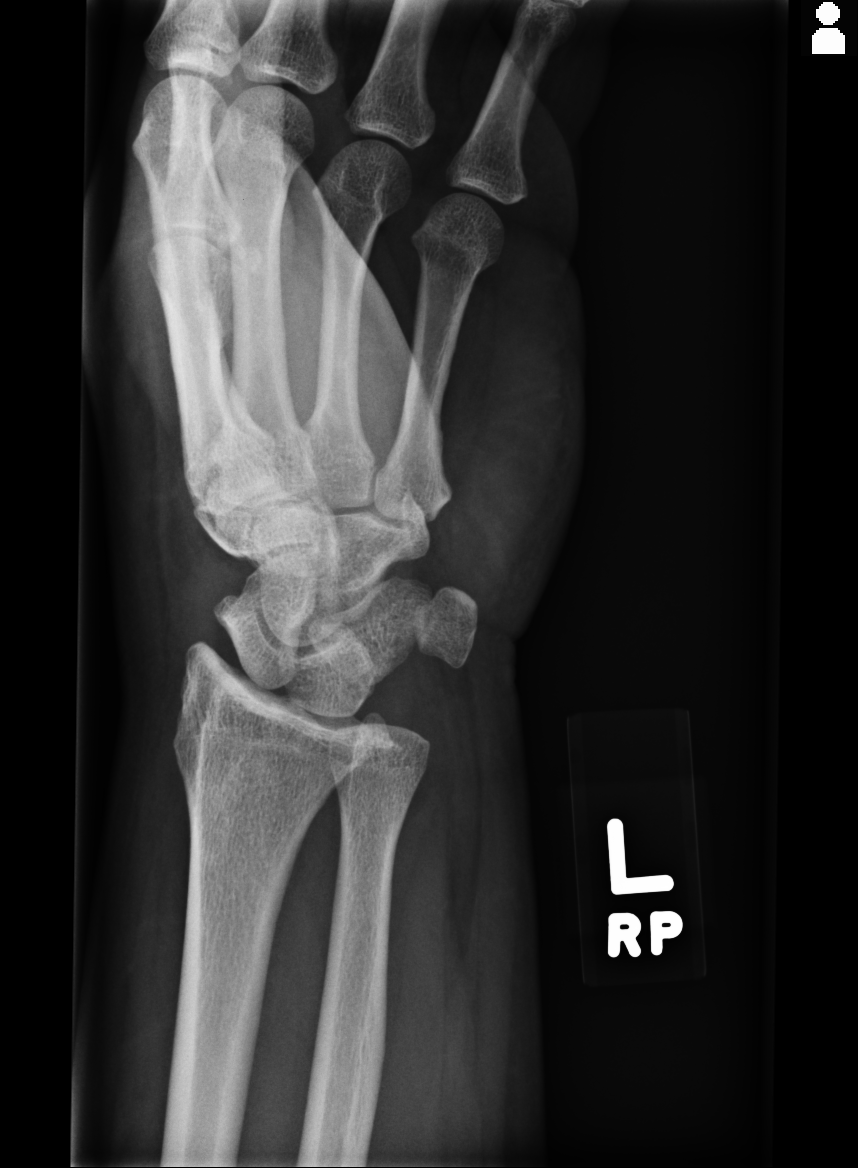

[4 of 4 positions shown; findings below may reference images not displayed]

FINDINGS: No acute fracture seen. Normal alignment. Small lucencies in the lunate are
likely secondary to carpal cyst.
IMPRESSION: No acute fracture seen.

If there is continued clinical concern for a radiographically occult
scaphoid fracture, such as snuff box tenderness, further evaluation with MRI
or immobilization and followup radiographs is recommended.

[REDACTED]

## 2014-11-05 ENCOUNTER — Other Ambulatory Visit: Payer: Self-pay | Admitting: Internal Medicine

## 2014-12-30 ENCOUNTER — Encounter: Payer: Self-pay | Admitting: Emergency Medicine

## 2014-12-30 ENCOUNTER — Emergency Department: Payer: 59

## 2014-12-30 ENCOUNTER — Emergency Department
Admission: EM | Admit: 2014-12-30 | Discharge: 2014-12-30 | Disposition: A | Discharge status: Home / Self Care | Payer: 59 | Attending: Emergency Medicine | Admitting: Emergency Medicine

## 2014-12-30 DIAGNOSIS — E119 Type 2 diabetes mellitus without complications: Secondary | ICD-10-CM | POA: Insufficient documentation

## 2014-12-30 DIAGNOSIS — Z7984 Long term (current) use of oral hypoglycemic drugs: Secondary | ICD-10-CM | POA: Insufficient documentation

## 2014-12-30 DIAGNOSIS — Z7982 Long term (current) use of aspirin: Secondary | ICD-10-CM | POA: Insufficient documentation

## 2014-12-30 DIAGNOSIS — I10 Essential (primary) hypertension: Secondary | ICD-10-CM | POA: Diagnosis not present

## 2014-12-30 DIAGNOSIS — Z79899 Other long term (current) drug therapy: Secondary | ICD-10-CM | POA: Diagnosis not present

## 2014-12-30 DIAGNOSIS — R69 Illness, unspecified: Secondary | ICD-10-CM

## 2014-12-30 DIAGNOSIS — J111 Influenza due to unidentified influenza virus with other respiratory manifestations: Secondary | ICD-10-CM | POA: Diagnosis not present

## 2014-12-30 DIAGNOSIS — R079 Chest pain, unspecified: Secondary | ICD-10-CM | POA: Diagnosis present

## 2014-12-30 DIAGNOSIS — J209 Acute bronchitis, unspecified: Secondary | ICD-10-CM | POA: Insufficient documentation

## 2014-12-30 LAB — CBC
HCT: 43.7 % (ref 40.0–52.0)
Hemoglobin: 14.8 g/dL (ref 13.0–18.0)
MCH: 28.9 pg (ref 26.0–34.0)
MCHC: 33.9 g/dL (ref 32.0–36.0)
MCV: 85.4 fL (ref 80.0–100.0)
PLATELETS: 224 10*3/uL (ref 150–440)
RBC: 5.12 MIL/uL (ref 4.40–5.90)
RDW: 14.5 % (ref 11.5–14.5)
WBC: 9.5 10*3/uL (ref 3.8–10.6)

## 2014-12-30 LAB — BASIC METABOLIC PANEL
Anion gap: 6 (ref 5–15)
BUN: 12 mg/dL (ref 6–20)
CHLORIDE: 102 mmol/L (ref 101–111)
CO2: 28 mmol/L (ref 22–32)
CREATININE: 1.07 mg/dL (ref 0.61–1.24)
Calcium: 9.1 mg/dL (ref 8.9–10.3)
GFR calc Af Amer: >60 mL/min (ref >60)
GFR calc non Af Amer: >60 mL/min (ref >60)
Glucose, Bld: 156 mg/dL — ABNORMAL HIGH (ref 65–99)
Potassium: 4 mmol/L (ref 3.5–5.1)
SODIUM: 136 mmol/L (ref 135–145)

## 2014-12-30 LAB — TROPONIN I: Troponin I: <0.03 ng/mL (ref <0.031)

## 2014-12-30 MED ORDER — NAPROXEN 500 MG PO TABS: 500.0000 mg | ORAL_TABLET | Freq: Two times a day (BID) | ORAL | Status: DC

## 2014-12-30 MED ORDER — KETOROLAC TROMETHAMINE 30 MG/ML IJ SOLN
30.0000 mg | Freq: Once | INTRAMUSCULAR | Status: AC
  Administered 2014-12-30: 30 mg via INTRAVENOUS
  Filled 2014-12-30: qty 1

## 2014-12-30 MED ORDER — IPRATROPIUM-ALBUTEROL 0.5-2.5 (3) MG/3ML IN SOLN
3.0000 mL | Freq: Once | RESPIRATORY_TRACT | Status: AC
  Administered 2014-12-30: 3 mL via RESPIRATORY_TRACT
  Filled 2014-12-30: qty 3

## 2014-12-30 MED ORDER — ALBUTEROL SULFATE HFA 108 (90 BASE) MCG/ACT IN AERS: 2.0000 | INHALATION_SPRAY | RESPIRATORY_TRACT | Status: DC | PRN

## 2014-12-30 MED ORDER — SODIUM CHLORIDE 0.9 % IV BOLUS (SEPSIS)
1000.0000 mL | Freq: Once | INTRAVENOUS | Status: AC
  Administered 2014-12-30: 1000 mL via INTRAVENOUS

## 2014-12-30 MED ORDER — DEXAMETHASONE SODIUM PHOSPHATE 10 MG/ML IJ SOLN
10.0000 mg | Freq: Once | INTRAMUSCULAR | Status: AC
  Administered 2014-12-30: 10 mg via INTRAVENOUS
  Filled 2014-12-30: qty 1

## 2014-12-30 NOTE — ED Notes (Signed)
Patient transported to CT 

## 2014-12-30 NOTE — ED Notes (Signed)
Pt presents to ED with c/o chest tightness 7/10 at this time that started yesterday even at approx 11pm. Pt also c/o flu like symptoms at this time including coughing, body aches, chills, weakness, and difficulty swallowing. Pt's wife states she has noticed increased weakness. Pt states hx of pneumonia.

## 2014-12-30 NOTE — Discharge Instructions (Signed)
Acute Bronchitis Bronchitis is inflammation of the airways that extend from the windpipe into the lungs (bronchi). The inflammation often causes mucus to develop. This leads to a cough, which is the most common symptom of bronchitis.  In acute bronchitis, the condition usually develops suddenly and goes away over time, usually in a couple weeks. Smoking, allergies, and asthma can make bronchitis worse. Repeated episodes of bronchitis may cause further lung problems.  CAUSES Acute bronchitis is most often caused by the same virus that causes a cold. The virus can spread from person to person (contagious) through coughing, sneezing, and touching contaminated objects. SIGNS AND SYMPTOMS   Cough.   Fever.   Coughing up mucus.   Body aches.   Chest congestion.   Chills.   Shortness of breath.   Sore throat.  DIAGNOSIS  Acute bronchitis is usually diagnosed through a physical exam. Your health care provider will also ask you questions about your medical history. Tests, such as chest X-rays, are sometimes done to rule out other conditions.  TREATMENT  Acute bronchitis usually goes away in a couple weeks. Oftentimes, no medical treatment is necessary. Medicines are sometimes given for relief of fever or cough. Antibiotic medicines are usually not needed but may be prescribed in certain situations. In some cases, an inhaler may be recommended to help reduce shortness of breath and control the cough. A cool mist vaporizer may also be used to help thin bronchial secretions and make it easier to clear the chest.  HOME CARE INSTRUCTIONS  Get plenty of rest.   Drink enough fluids to keep your urine clear or pale yellow (unless you have a medical condition that requires fluid restriction). Increasing fluids may help thin your respiratory secretions (sputum) and reduce chest congestion, and it will prevent dehydration.   Take medicines only as directed by your health care provider.  If  you were prescribed an antibiotic medicine, finish it all even if you start to feel better.  Avoid smoking and secondhand smoke. Exposure to cigarette smoke or irritating chemicals will make bronchitis worse. If you are a smoker, consider using nicotine gum or skin patches to help control withdrawal symptoms. Quitting smoking will help your lungs heal faster.   Reduce the chances of another bout of acute bronchitis by washing your hands frequently, avoiding people with cold symptoms, and trying not to touch your hands to your mouth, nose, or eyes.   Keep all follow-up visits as directed by your health care provider.  SEEK MEDICAL CARE IF: Your symptoms do not improve after 1 week of treatment.  SEEK IMMEDIATE MEDICAL CARE IF:  You develop an increased fever or chills.   You have chest pain.   You have severe shortness of breath.  You have bloody sputum.   You develop dehydration.  You faint or repeatedly feel like you are going to pass out.  You develop repeated vomiting.  You develop a severe headache. MAKE SURE YOU:   Understand these instructions.  Will watch your condition.  Will get help right away if you are not doing well or get worse.   This information is not intended to replace advice given to you by your health care provider. Make sure you discuss any questions you have with your health care provider.   Document Released: 02/28/2004 Document Revised: 02/10/2014 Document Reviewed: 07/13/2012 Elsevier Interactive Patient Education 2016 [employer]  Viral Infections A viral infection can be caused by different types of viruses.Most viral infections are not serious  and resolve on their own. However, some infections may cause severe symptoms and may lead to further complications. SYMPTOMS Viruses can frequently cause:  Minor sore throat.  Aches and pains.  Headaches.  Runny nose.  Different types of rashes.  Watery  eyes.  Tiredness.  Cough.  Loss of appetite.  Gastrointestinal infections, resulting in nausea, vomiting, and diarrhea. These symptoms do not respond to antibiotics because the infection is not caused by bacteria. However, you might catch a bacterial infection following the viral infection. This is sometimes called a "superinfection." Symptoms of such a bacterial infection may include:  Worsening sore throat with pus and difficulty swallowing.  Swollen neck glands.  Chills and a high or persistent fever.  Severe headache.  Tenderness over the sinuses.  Persistent overall ill feeling (malaise), muscle aches, and tiredness (fatigue).  Persistent cough.  Yellow, green, or brown mucus production with coughing. HOME CARE INSTRUCTIONS   Only take over-the-counter or prescription medicines for pain, discomfort, diarrhea, or fever as directed by your caregiver.  Drink enough water and fluids to keep your urine clear or pale yellow. Sports drinks can provide valuable electrolytes, sugars, and hydration.  Get plenty of rest and maintain proper nutrition. Soups and broths with crackers or rice are fine. SEEK IMMEDIATE MEDICAL CARE IF:   You have severe headaches, shortness of breath, chest pain, neck pain, or an unusual rash.  You have uncontrolled vomiting, diarrhea, or you are unable to keep down fluids.  You or your child has an oral temperature above 102 F (38.9 C), not controlled by medicine.  Your baby is older than 3 months with a rectal temperature of 102 F (38.9 C) or higher.  Your baby is 37 months old or younger with a rectal temperature of 100.4 F (38 C) or higher. MAKE SURE YOU:   Understand these instructions.  Will watch your condition.  Will get help right away if you are not doing well or get worse.   This information is not intended to replace advice given to you by your health care provider. Make sure you discuss any questions you have with your health  care provider.   Document Released: 10/30/2004 Document Revised: 04/14/2011 Document Reviewed: 06/28/2014 Elsevier Interactive Patient Education [employer].

## 2014-12-30 NOTE — ED Provider Notes (Signed)
[facility] Emergency Department Provider Note  ____________________________________________  Time seen: 6:05 PM  I have reviewed the triage vital signs and the nursing notes.   HISTORY  Chief Complaint Chest Pain and Flu Like Symptoms     HPI Gerald [name] is a 49 y.o. male who complains of chest tightness and wheezing for 2 days associated with body aches, chills, generalized weakness, sore throat, rhinorrhea, nonproductive cough. No exertional symptoms. Not pleuritic. I compliant with medications. No syncope.     Past Medical History  Diagnosis Date  . Myocardial infarction ([place]) 09/2011  . Hypertension   . Hyperlipidemia   . Obesity   . Diabetes mellitus without complication (HCC)     borderline  . Nonischemic cardiomyopathy (HCC)     Previous ejection fraction of 35-40% on echo. 50% on cardiac catheterization in August of 2013  . Sleep apnea   . Coronary artery disease     Mild nonobstructive coronary artery disease on previous cardiac catheterization most recently in August of 2013  . Gastritis   . Hepatic steatosis   . Abnormal LFTs   . GERD (gastroesophageal reflux disease)   . Fundic gland polyps of stomach, benign      Patient Active Problem List   Diagnosis Date Noted  . Other chest pain 05/22/2014  . Mass of jaw 05/22/2014  . Right flank pain 11/23/2013  . Dysuria 11/23/2013  . Encounter for wellness examination 11/08/2013  . Retaining fluid 11/08/2013  . Urinary incontinence 03/14/2013  . Depression 01/13/2013  . Type 2 diabetes mellitus ([place]) 09/14/2012  . Nonischemic cardiomyopathy ([place])   . HLD (hyperlipidemia) 05/28/2012  . CAD (coronary artery disease) 03/30/2012  . Chronic pain syndrome 03/30/2012  . HTN (hypertension) 03/30/2012     Past Surgical History  Procedure Laterality Date  . Colonoscopy    . Cardiac catheterization N/A 09/2011    [facility]; EF 50% with 30% mid LAD stenosis and no obstructive disease.   . Cardiac catheterization  09/2010    [facility]; Mid LAD 40% stenosis; Mid Circumflex:Normal; Mid RCA; Normal  . Knee arthroscopy with medial menisectomy Right 09/16/2012    Procedure: RIGHT KNEE ARTHROSCOPY WITH MEDIAL AND LATERAL MENISECTOMY, CHONDROPLASTY;  Surgeon: Ninetta Lights, MD;  Location: [place];  Service: Orthopedics;  Laterality: Right;  RIGHT KNEE SCOPE MEDIAL MENISCECTOMY  . Cardiac catheterization       Current Outpatient Rx  Name  Route  Sig  Dispense  Refill  . albuterol (PROVENTIL HFA) 108 (90 BASE) MCG/ACT inhaler   Inhalation   Inhale 2 puffs into the lungs every 4 (four) hours as needed for wheezing or shortness of breath.   1 Inhaler   0   . aspirin 81 MG EC tablet   Oral   Take 81 mg by mouth every evening. Swallow whole.         . citalopram (CELEXA) 20 MG tablet   Oral   Take 1 tablet (20 mg total) by mouth daily.   90 tablet   0   . Cyanocobalamin (VITAMIN B-12) 5000 MCG SUBL   Sublingual   Place 2 tablets under the tongue daily.         . furosemide (LASIX) 20 MG tablet   Oral   Take 1 tablet (20 mg total) by mouth daily.   90 tablet   1   . gemfibrozil (LOPID) 600 MG tablet   Oral   Take 1 tablet (600 mg total) by  mouth 2 (two) times daily before a meal. Patient not taking: Reported on 06/13/2014   180 tablet   1   . glucose blood test strip      Use as instructed   100 each   12   . lisinopril (PRINIVIL,ZESTRIL) 5 MG tablet   Oral   Take 1 tablet (5 mg total) by mouth daily.   90 tablet   1   . metFORMIN (GLUCOPHAGE) 500 MG tablet   Oral   Take 500 mg by mouth 2 (two) times daily with a meal.         . metoprolol succinate (TOPROL-XL) 25 MG 24 hr tablet   Oral   Take 1 tablet (25 mg total) by mouth daily.   90 tablet   1   . naproxen (NAPROSYN) 500 MG tablet   Oral   Take 1 tablet (500 mg total) by mouth 2 (two) times daily with a meal.   20 tablet   0   . nitroGLYCERIN (NITROSTAT) 0.4 MG SL  tablet   Sublingual   Place 1 tablet (0.4 mg total) under the tongue every 5 (five) minutes as needed for chest pain.   75 tablet   1   . omeprazole (PRILOSEC) 40 MG capsule      TAKE 1 CAPSULE (40 MG TOTAL) BY MOUTH DAILY.   30 capsule   6   . ondansetron (ZOFRAN-ODT) 4 MG disintegrating tablet               . potassium chloride SA (K-DUR,KLOR-CON) 20 MEQ tablet   Oral   Take 1 tablet (20 mEq total) by mouth daily.   90 tablet   1   . tamsulosin (FLOMAX) 0.4 MG CAPS capsule   Oral   Take 1 capsule (0.4 mg total) by mouth daily. Patient not taking: Reported on 06/13/2014   30 capsule   0      Allergies Statins   Family History  Problem Relation Age of Onset  . Heart disease Father 90    MI  . Heart attack Father   . Stomach cancer Father   . Hypertension Mother   . Breast cancer Mother     Social History Social History  Substance Use Topics  . Smoking status: Never Smoker   . Smokeless tobacco: Never Used  . Alcohol Use: No     Comment: rare    Review of Systems  Constitutional:   No fever positive chills. No weight changes Eyes:   No blurry vision or double vision.  ENT:   Positive sore throat. Cardiovascular:   No chest pain. Respiratory:   Positive dyspnea with cough. Gastrointestinal:   Negative for abdominal pain, vomiting and diarrhea.  No BRBPR or melena. Positive nausea Genitourinary:   Negative for dysuria, urinary retention, bloody urine, or difficulty urinating. Musculoskeletal:   Positive diffuse myalgia Skin:   Negative for rash. Neurological:   Negative for headaches, focal weakness or numbness. Psychiatric:  No anxiety or depression.   Endocrine:  No hot/cold intolerance, changes in energy, or sleep difficulty.  10-point ROS otherwise negative.  ____________________________________________   PHYSICAL EXAM:  VITAL SIGNS: ED Triage Vitals  Enc Vitals Group     BP 12/30/14 1646 157/85 mmHg     Pulse Rate 12/30/14 1646 85      Resp 12/30/14 1646 22     Temp 12/30/14 1646 98.2 F (36.8 C)     Temp Source 12/30/14 1646 Oral  SpO2 12/30/14 1646 93 %     Weight 12/30/14 1646 329 lb (149.233 kg)     Height 12/30/14 1646 6\' 2"  (1.88 m)     Head Cir --      Peak Flow --      Pain Score 12/30/14 1646 7     Pain Loc --      Pain Edu? --      Excl. in [place]? --      Constitutional:   Alert and oriented. Well appearing and in no distress. Eyes:   No scleral icterus. No conjunctival pallor. PERRL. EOMI ENT   Head:   Normocephalic and atraumatic.   Nose:   No congestion/rhinnorhea. No septal hematoma   Mouth/Throat:   MMM, no pharyngeal erythema. No peritonsillar mass. No uvula shift.   Neck:   No stridor. No SubQ emphysema. No meningismus. Hematological/Lymphatic/Immunilogical:   No cervical lymphadenopathy. Cardiovascular:   RRR. Normal and symmetric distal pulses are present in all extremities. No murmurs, rubs, or gallops. Respiratory:   Normal respiratory effort without tachypnea nor retractions. Diffuse end expiratory wheezing.  Gastrointestinal:   Soft and nontender. No distention. There is no CVA tenderness.  No rebound, rigidity, or guarding. Genitourinary:   deferred Musculoskeletal:   Nontender with normal range of motion in all extremities. No joint effusions.  No lower extremity tenderness.  No edema. Neurologic:   Normal speech and language.  CN 2-10 normal. Motor grossly intact. No pronator drift.  Normal gait. No gross focal neurologic deficits are appreciated.  Skin:    Skin is warm, dry and intact. No rash noted.  No petechiae, purpura, or bullae. Psychiatric:   Mood and affect are normal. Speech and behavior are normal. Patient exhibits appropriate insight and judgment.  ____________________________________________    LABS (pertinent positives/negatives) (all labs ordered are listed, but only abnormal results are displayed) Labs Reviewed  BASIC METABOLIC PANEL - Abnormal;  Notable for the following:    Glucose, Bld 156 (*)    All other components within normal limits  CBC  TROPONIN I   ____________________________________________   EKG  Interpreted by me Sinus rhythm rate of 79, normal axis. Prolonged PR interval with 210 ms. Poor R-wave progression in anterior precordial leads. Normal ST segments and T waves.  ____________________________________________    RADIOLOGY  Chest x-ray unremarkable  ____________________________________________   PROCEDURES   ____________________________________________   INITIAL IMPRESSION / ASSESSMENT AND PLAN / ED COURSE  Pertinent labs & imaging results that were available during my care of the patient were reviewed by me and considered in my medical decision making (see chart for details).  Patient presents with influenza-like illness and acute bronchitis. Labs and chest x-ray are unremarkable. Exam is reassuring. Vital signs are unremarkable. Give the patient Decadron nebulizer treatment and NSAIDs. Overall well-appearing no acute distress. Low suspicion for pneumothorax PE ACS carditis mediastinitis pneumonia or sepsis.   ____________________________________________   FINAL CLINICAL IMPRESSION(S) / ED DIAGNOSES  Final diagnoses:  Influenza-like illness  Acute bronchitis, unspecified organism      Carrie Mew, MD 12/30/14 1935

## 2015-01-06 ENCOUNTER — Emergency Department (HOSPITAL_COMMUNITY)
Admission: EM | Admit: 2015-01-06 | Discharge: 2015-01-07 | Disposition: A | Discharge status: Home / Self Care | Payer: 59 | Attending: Emergency Medicine | Admitting: Emergency Medicine

## 2015-01-06 DIAGNOSIS — I10 Essential (primary) hypertension: Secondary | ICD-10-CM | POA: Insufficient documentation

## 2015-01-06 DIAGNOSIS — K219 Gastro-esophageal reflux disease without esophagitis: Secondary | ICD-10-CM | POA: Insufficient documentation

## 2015-01-06 DIAGNOSIS — K2970 Gastritis, unspecified, without bleeding: Secondary | ICD-10-CM | POA: Diagnosis not present

## 2015-01-06 DIAGNOSIS — R531 Weakness: Secondary | ICD-10-CM | POA: Insufficient documentation

## 2015-01-06 DIAGNOSIS — R519 Headache, unspecified: Secondary | ICD-10-CM

## 2015-01-06 DIAGNOSIS — R51 Headache: Secondary | ICD-10-CM | POA: Diagnosis present

## 2015-01-06 DIAGNOSIS — Z791 Long term (current) use of non-steroidal anti-inflammatories (NSAID): Secondary | ICD-10-CM | POA: Insufficient documentation

## 2015-01-06 DIAGNOSIS — I252 Old myocardial infarction: Secondary | ICD-10-CM | POA: Insufficient documentation

## 2015-01-06 DIAGNOSIS — Z8669 Personal history of other diseases of the nervous system and sense organs: Secondary | ICD-10-CM | POA: Diagnosis not present

## 2015-01-06 DIAGNOSIS — E669 Obesity, unspecified: Secondary | ICD-10-CM | POA: Diagnosis not present

## 2015-01-06 DIAGNOSIS — I2510 Atherosclerotic heart disease of native coronary artery without angina pectoris: Secondary | ICD-10-CM | POA: Insufficient documentation

## 2015-01-06 DIAGNOSIS — Z7982 Long term (current) use of aspirin: Secondary | ICD-10-CM | POA: Diagnosis not present

## 2015-01-06 DIAGNOSIS — Z9889 Other specified postprocedural states: Secondary | ICD-10-CM | POA: Insufficient documentation

## 2015-01-06 DIAGNOSIS — E119 Type 2 diabetes mellitus without complications: Secondary | ICD-10-CM | POA: Insufficient documentation

## 2015-01-06 DIAGNOSIS — Z79899 Other long term (current) drug therapy: Secondary | ICD-10-CM | POA: Insufficient documentation

## 2015-01-06 DIAGNOSIS — I639 Cerebral infarction, unspecified: Secondary | ICD-10-CM

## 2015-01-06 DIAGNOSIS — I160 Hypertensive urgency: Secondary | ICD-10-CM | POA: Diagnosis not present

## 2015-01-06 NOTE — ED Provider Notes (Signed)
CSN: [id number]     Arrival date & time 01/06/15  2356 History  By signing my name below, I, Jolayne Panther, attest that this documentation has been prepared under the direction and in the presence of Jola Schmidt, MD. Electronically Signed: Jolayne Panther, [place]. 01/07/2015. 12:12 AM.     Chief Complaint  Patient presents with  . Code Stroke    The history is provided by the patient. No language interpreter was used.    HPI Comments: Gerald [name] is a 49 y.o. male who presents to the Emergency Department complaining of severe headache which began when he was watching a football game.  His wife reports they became up set and then began complaining of a severe headache.  EMS report the patient was poorly responsive than weak in all 4 extremities.  He was brought to the emergency department as a code stroke.  His blood pressure was elevated.  EMS.  Patient reports a history of headaches.  He denies difficulty speaking.  He states this headache is more severe.    Past Medical History  Diagnosis Date  . Myocardial infarction ([place]) 09/2011  . Hypertension   . Hyperlipidemia   . Obesity   . Diabetes mellitus without complication (HCC)     borderline  . Nonischemic cardiomyopathy (HCC)     Previous ejection fraction of 35-40% on echo. 50% on cardiac catheterization in August of 2013  . Sleep apnea   . Coronary artery disease     Mild nonobstructive coronary artery disease on previous cardiac catheterization most recently in August of 2013  . Gastritis   . Hepatic steatosis   . Abnormal LFTs   . GERD (gastroesophageal reflux disease)   . Fundic gland polyps of stomach, benign    Past Surgical History  Procedure Laterality Date  . Colonoscopy    . Cardiac catheterization N/A 09/2011    [facility]; EF 50% with 30% mid LAD stenosis and no obstructive disease.  . Cardiac catheterization  09/2010    [facility]; Mid LAD 40% stenosis; Mid Circumflex:Normal; Mid RCA; Normal  . Knee  arthroscopy with medial menisectomy Right 09/16/2012    Procedure: RIGHT KNEE ARTHROSCOPY WITH MEDIAL AND LATERAL MENISECTOMY, CHONDROPLASTY;  Surgeon: Ninetta Lights, MD;  Location: [place];  Service: Orthopedics;  Laterality: Right;  RIGHT KNEE SCOPE MEDIAL MENISCECTOMY  . Cardiac catheterization     Family History  Problem Relation Age of Onset  . Heart disease Father 45    MI  . Heart attack Father   . Stomach cancer Father   . Hypertension Mother   . Breast cancer Mother    Social History  Substance Use Topics  . Smoking status: Never Smoker   . Smokeless tobacco: Never Used  . Alcohol Use: No     Comment: rare    Review of Systems  All other systems reviewed and are negative.  A complete 10 system review of systems was obtained and all systems are negative except as noted in the HPI and PMH.   Allergies  Statins  Home Medications   Prior to Admission medications   Medication Sig Start Date End Date Taking? Authorizing Provider  albuterol (PROVENTIL HFA) 108 (90 BASE) MCG/ACT inhaler Inhale 2 puffs into the lungs every 4 (four) hours as needed for wheezing or shortness of breath. 12/30/14   Carrie Mew, MD  aspirin 81 MG EC tablet Take 81 mg by mouth every evening. Swallow whole. 03/30/12   Talia  Nicki Reaper, MD  citalopram (CELEXA) 20 MG tablet Take 1 tablet (20 mg total) by mouth daily. 01/31/14   Jearld Fenton, NP  Cyanocobalamin (VITAMIN B-12) 5000 MCG SUBL Place 2 tablets under the tongue daily.    Historical Provider, MD  furosemide (LASIX) 20 MG tablet Take 1 tablet (20 mg total) by mouth daily. 11/08/13   Lucille Passy, MD  gemfibrozil (LOPID) 600 MG tablet Take 1 tablet (600 mg total) by mouth 2 (two) times daily before a meal. Patient not taking: Reported on 06/13/2014 11/08/13   Lucille Passy, MD  glucose blood test strip Use as instructed 05/28/12   Lucille Passy, MD  lisinopril (PRINIVIL,ZESTRIL) 5 MG tablet Take 1 tablet (5 mg total) by mouth  daily. 11/08/13   Lucille Passy, MD  metFORMIN (GLUCOPHAGE) 500 MG tablet Take 500 mg by mouth 2 (two) times daily with a meal.    Historical Provider, MD  metoprolol succinate (TOPROL-XL) 25 MG 24 hr tablet Take 1 tablet (25 mg total) by mouth daily. 11/08/13   Lucille Passy, MD  naproxen (NAPROSYN) 500 MG tablet Take 1 tablet (500 mg total) by mouth 2 (two) times daily with a meal. 12/30/14   Carrie Mew, MD  nitroGLYCERIN (NITROSTAT) 0.4 MG SL tablet Place 1 tablet (0.4 mg total) under the tongue every 5 (five) minutes as needed for chest pain. 11/08/13   Lucille Passy, MD  omeprazole (PRILOSEC) 40 MG capsule TAKE 1 CAPSULE (40 MG TOTAL) BY MOUTH DAILY. 11/06/14   Irene Shipper, MD  ondansetron (ZOFRAN-ODT) 4 MG disintegrating tablet  11/28/13   Historical Provider, MD  potassium chloride SA (K-DUR,KLOR-CON) 20 MEQ tablet Take 1 tablet (20 mEq total) by mouth daily. 11/08/13   Lucille Passy, MD  tamsulosin (FLOMAX) 0.4 MG CAPS capsule Take 1 capsule (0.4 mg total) by mouth daily. Patient not taking: Reported on 06/13/2014 11/23/13   Lucille Passy, MD   There were no vitals taken for this visit. Physical Exam  Constitutional: He is oriented to person, place, and time. He appears well-developed and well-nourished.  HENT:  Head: Normocephalic and atraumatic.  Eyes: EOM are normal. Pupils are equal, round, and reactive to light.  Neck: Normal range of motion.  Cardiovascular: Normal rate, regular rhythm, normal heart sounds and intact distal pulses.   Pulmonary/Chest: Effort normal and breath sounds normal. No respiratory distress.  Abdominal: Soft. He exhibits no distension. There is no tenderness.  Musculoskeletal: Normal range of motion.  Neurological: He is alert and oriented to person, place, and time.  Poor effort in muscle strength testing her upper lower extremities Speech normal. No facial asymetry.   Skin: Skin is warm and dry.  Psychiatric: He has a normal mood and affect. Judgment  normal.  Nursing note and vitals reviewed.   ED Course  Procedures  DIAGNOSTIC STUDIES:    Oxygen Saturation is 92% on RA, low by my interpretation.   COORDINATION OF CARE:  12:12 AM  Discussed treatment plan with pt at bedside and pt agreed to plan.     Labs Review Labs Reviewed  CBC - Abnormal; Notable for the following:    WBC 10.7 (*)    All other components within normal limits  DIFFERENTIAL - Abnormal; Notable for the following:    Lymphs Abs 4.8 (*)    All other components within normal limits  COMPREHENSIVE METABOLIC PANEL - Abnormal; Notable for the following:    Glucose, Bld 212 (*)  All other components within normal limits  URINALYSIS, ROUTINE W REFLEX MICROSCOPIC (NOT AT [facility]) - Abnormal; Notable for the following:    Hgb urine dipstick TRACE (*)    All other components within normal limits  URINE MICROSCOPIC-ADD ON - Abnormal; Notable for the following:    Squamous Epithelial / LPF 0-5 (*)    All other components within normal limits  I-STAT CHEM 8, ED - Abnormal; Notable for the following:    Chloride 100 (*)    Glucose, Bld 212 (*)    Calcium, Ion 1.11 (*)    All other components within normal limits  ETHANOL  PROTIME-INR  APTT  URINE RAPID DRUG SCREEN, HOSP PERFORMED  I-STAT TROPOININ, ED    Imaging Review Ct Head Wo Contrast  01/07/2015  CLINICAL DATA:  49 year old male with headache dizziness altered mental status and left jaw pain. Code stroke. EXAM: CT HEAD WITHOUT CONTRAST TECHNIQUE: Contiguous axial images were obtained from the base of the skull through the vertex without intravenous contrast. COMPARISON:  Head CT dated 05/25/2011 FINDINGS: The ventricles and the sulci are appropriate in size for the patient's age. There is no intracranial hemorrhage. No midline shift or mass effect identified. The gray-white matter differentiation is preserved. The visualized paranasal sinuses and mastoid air cells are well aerated. The calvarium is intact. A  nasogastric tube is partially visualized. IMPRESSION: No acute intracranial pathology. These results were called by telephone at the time of interpretation on 01/07/2015 at 12:14 am to Dr. Jola Schmidt , who verbally acknowledged these results. Electronically Signed   By: Anner Crete M.D.   On: 01/07/2015 00:14   Mr Brain Wo Contrast  01/07/2015  CLINICAL DATA:  Acute onset headache and decreased responsiveness. History of hypertension, diabetes, hyperlipidemia, myocardial infarction. EXAM: MRI HEAD WITHOUT CONTRAST TECHNIQUE: Multiplanar, multiecho pulse sequences of the brain and surrounding structures were obtained without intravenous contrast. COMPARISON:  CT head January 07, 2015 at 0002 hours and MRI brain at March 11, 2003 FINDINGS: The ventricles and sulci are normal for patient's age. No abnormal parenchymal signal, mass lesions, mass effect. No reduced diffusion to suggest acute ischemia. No susceptibility artifact to suggest hemorrhage. No abnormal extra-axial fluid collections. No extra-axial masses though, contrast enhanced sequences would be more sensitive. Normal major intracranial vascular flow voids seen at the skull base. Ocular globes and orbital contents are unremarkable though not tailored for evaluation. No abnormal sellar expansion. Trace ethmoid mucosal thickening without paranasal sinus air-fluid levels. The mastoid air cells are well aerated. No suspicious calvarial bone marrow signal. Craniocervical junction maintained. IMPRESSION: Negative noncontrast MRI brain. Electronically Signed   By: Elon Alas M.D.   On: 01/07/2015 03:49   I have personally reviewed and evaluated these images and lab results as part of my medical decision-making.   EKG Interpretation   Date/Time:  Sunday January 07 2015 00:11:58 EST Ventricular Rate:  81 PR Interval:  227 QRS Duration: 99 QT Interval:  408 QTC Calculation: 474 R Axis:   70 Text Interpretation:  Sinus rhythm Prolonged  PR interval No significant  change was found Confirmed by Mckenzey Parcell  MD, Payton Moder ([id number]) on 01/08/2015  3:48:02 AM      MDM   Final diagnoses:  Headache, unspecified headache type    Patient brought to the emergency department as a code stroke.  My suspicion that this is acute stroke is low.  He was seen by the stroke service as well and underwent stat head CT which was without  abnormalities.  It was recommended by neurology that he undergo MRI imaging.  Much of this may be more, get a migraine in nature.  Patient's headache resolved 12 emergency department is feeling much better at this time.  His MRI is negative.  He's been ambulatory in the ER without difficulty.  Discharge home in good condition.  Primary care follow-up.  He understands to return to the ER for new or worsening symptoms.  His headache is nearly resolved at this time.  I personally performed the services described in this documentation, which was scribed in my presence. The recorded information has been reviewed and is accurate.       Jola Schmidt, MD 01/08/15 [phone]

## 2015-01-07 ENCOUNTER — Encounter (HOSPITAL_COMMUNITY): Payer: Self-pay

## 2015-01-07 ENCOUNTER — Emergency Department (HOSPITAL_COMMUNITY): Payer: 59

## 2015-01-07 DIAGNOSIS — I160 Hypertensive urgency: Secondary | ICD-10-CM

## 2015-01-07 LAB — URINALYSIS, ROUTINE W REFLEX MICROSCOPIC
BILIRUBIN URINE: NEGATIVE
Glucose, UA: NEGATIVE mg/dL
Ketones, ur: NEGATIVE mg/dL
Leukocytes, UA: NEGATIVE
Nitrite: NEGATIVE
PH: 5 (ref 5.0–8.0)
Protein, ur: NEGATIVE mg/dL
SPECIFIC GRAVITY, URINE: 1.014 (ref 1.005–1.030)

## 2015-01-07 LAB — COMPREHENSIVE METABOLIC PANEL
ALBUMIN: 3.8 g/dL (ref 3.5–5.0)
ALK PHOS: 120 U/L (ref 38–126)
ALT: 42 U/L (ref 17–63)
AST: 24 U/L (ref 15–41)
Anion gap: 8 (ref 5–15)
BUN: 12 mg/dL (ref 6–20)
CHLORIDE: 102 mmol/L (ref 101–111)
CO2: 26 mmol/L (ref 22–32)
CREATININE: 1.16 mg/dL (ref 0.61–1.24)
Calcium: 9.3 mg/dL (ref 8.9–10.3)
GFR calc non Af Amer: >60 mL/min (ref >60)
GLUCOSE: 212 mg/dL — AB (ref 65–99)
Potassium: 3.9 mmol/L (ref 3.5–5.1)
SODIUM: 136 mmol/L (ref 135–145)
Total Bilirubin: 0.7 mg/dL (ref 0.3–1.2)
Total Protein: 7.5 g/dL (ref 6.5–8.1)

## 2015-01-07 LAB — DIFFERENTIAL
BASOS PCT: 0 %
Basophils Absolute: 0 10*3/uL (ref 0.0–0.1)
EOS PCT: 1 %
Eosinophils Absolute: 0.2 10*3/uL (ref 0.0–0.7)
LYMPHS PCT: 44 %
Lymphs Abs: 4.8 10*3/uL — ABNORMAL HIGH (ref 0.7–4.0)
Monocytes Absolute: 0.7 10*3/uL (ref 0.1–1.0)
Monocytes Relative: 6 %
NEUTROS ABS: 5.1 10*3/uL (ref 1.7–7.7)
NEUTROS PCT: 49 %

## 2015-01-07 LAB — CBC
HCT: 43.1 % (ref 39.0–52.0)
Hemoglobin: 14.6 g/dL (ref 13.0–17.0)
MCH: 29.3 pg (ref 26.0–34.0)
MCHC: 33.9 g/dL (ref 30.0–36.0)
MCV: 86.5 fL (ref 78.0–100.0)
PLATELETS: 236 10*3/uL (ref 150–400)
RBC: 4.98 MIL/uL (ref 4.22–5.81)
RDW: 13.8 % (ref 11.5–15.5)
WBC: 10.7 10*3/uL — AB (ref 4.0–10.5)

## 2015-01-07 LAB — RAPID URINE DRUG SCREEN, HOSP PERFORMED
AMPHETAMINES: NOT DETECTED
BENZODIAZEPINES: NOT DETECTED
Barbiturates: NOT DETECTED
Cocaine: NOT DETECTED
OPIATES: NOT DETECTED
Tetrahydrocannabinol: NOT DETECTED

## 2015-01-07 LAB — APTT: aPTT: 28 seconds (ref 24–37)

## 2015-01-07 LAB — I-STAT CHEM 8, ED
BUN: 14 mg/dL (ref 6–20)
CHLORIDE: 100 mmol/L — AB (ref 101–111)
Calcium, Ion: 1.11 mmol/L — ABNORMAL LOW (ref 1.12–1.23)
Creatinine, Ser: 1 mg/dL (ref 0.61–1.24)
Glucose, Bld: 212 mg/dL — ABNORMAL HIGH (ref 65–99)
HEMATOCRIT: 47 % (ref 39.0–52.0)
Hemoglobin: 16 g/dL (ref 13.0–17.0)
Potassium: 3.8 mmol/L (ref 3.5–5.1)
SODIUM: 139 mmol/L (ref 135–145)
TCO2: 26 mmol/L (ref 0–100)

## 2015-01-07 LAB — PROTIME-INR
INR: 1.03 (ref 0.00–1.49)
PROTHROMBIN TIME: 13.7 s (ref 11.6–15.2)

## 2015-01-07 LAB — URINE MICROSCOPIC-ADD ON
Bacteria, UA: NONE SEEN
WBC, UA: NONE SEEN WBC/hpf (ref 0–5)

## 2015-01-07 LAB — I-STAT TROPONIN, ED: Troponin i, poc: 0 ng/mL (ref 0.00–0.08)

## 2015-01-07 LAB — ETHANOL: Alcohol, Ethyl (B): <5 mg/dL (ref <5)

## 2015-01-07 MED ORDER — LORAZEPAM 2 MG/ML IJ SOLN
1.0000 mg | Freq: Once | INTRAMUSCULAR | Status: AC
  Administered 2015-01-07: 1 mg via INTRAVENOUS
  Filled 2015-01-07: qty 1

## 2015-01-07 MED ORDER — MORPHINE SULFATE (PF) 4 MG/ML IV SOLN
4.0000 mg | Freq: Once | INTRAVENOUS | Status: AC
  Administered 2015-01-07: 4 mg via INTRAVENOUS
  Filled 2015-01-07: qty 1

## 2015-01-07 MED ORDER — KETOROLAC TROMETHAMINE 30 MG/ML IJ SOLN
30.0000 mg | Freq: Once | INTRAMUSCULAR | Status: AC
Start: 2015-01-07 — End: 2015-01-07
  Administered 2015-01-07: 30 mg via INTRAVENOUS
  Filled 2015-01-07: qty 1

## 2015-01-07 MED ORDER — HYDROMORPHONE HCL 1 MG/ML IJ SOLN
1.0000 mg | Freq: Once | INTRAMUSCULAR | Status: AC
  Administered 2015-01-07: 1 mg via INTRAVENOUS
  Filled 2015-01-07: qty 1

## 2015-01-07 MED ORDER — METOCLOPRAMIDE HCL 5 MG/ML IJ SOLN
10.0000 mg | Freq: Once | INTRAMUSCULAR | Status: AC
  Administered 2015-01-07: 10 mg via INTRAVENOUS
  Filled 2015-01-07: qty 2

## 2015-01-07 NOTE — Discharge Instructions (Signed)

## 2015-01-07 NOTE — Consult Note (Signed)
Referring Physician: ED    Chief Complaint: code stroke, HA, decreased responsiveness  HPI:                                                                                                                                         Gerald [name] is an 49 y.o. male with a past medical history significant for HTN, DM, HLD, CAD, MI, non ischemic cardiomyopathy, OSA, and obesity, brought in by EMS as a code stroke due to acute onset of the above stated symptoms. Patient was home watching a game with his wife when he got upset and started complaining of a severe HA. EMS was called and reportedly patient was flaccid all over, poorly responsive, complaining of left sided HA, with BP>190. NIHSS 5. CT brain was personally reviewed and showed no acute abnormality. SBP>200 in the ED  Date last known well: 01/06/15 Time last known well: 2130 tPA Given: no,minimal deficits NIHSS: 5 MRS: 0  Past Medical History  Diagnosis Date  . Myocardial infarction ([place]) 09/2011  . Hypertension   . Hyperlipidemia   . Obesity   . Diabetes mellitus without complication (HCC)     borderline  . Nonischemic cardiomyopathy (HCC)     Previous ejection fraction of 35-40% on echo. 50% on cardiac catheterization in August of 2013  . Sleep apnea   . Coronary artery disease     Mild nonobstructive coronary artery disease on previous cardiac catheterization most recently in August of 2013  . Gastritis   . Hepatic steatosis   . Abnormal LFTs   . GERD (gastroesophageal reflux disease)   . Fundic gland polyps of stomach, benign     Past Surgical History  Procedure Laterality Date  . Colonoscopy    . Cardiac catheterization N/A 09/2011    [facility]; EF 50% with 30% mid LAD stenosis and no obstructive disease.  . Cardiac catheterization  09/2010    [facility]; Mid LAD 40% stenosis; Mid Circumflex:Normal; Mid RCA; Normal  . Knee arthroscopy with medial menisectomy Right 09/16/2012    Procedure: RIGHT KNEE ARTHROSCOPY WITH MEDIAL AND  LATERAL MENISECTOMY, CHONDROPLASTY;  Surgeon: Ninetta Lights, MD;  Location: [place];  Service: Orthopedics;  Laterality: Right;  RIGHT KNEE SCOPE MEDIAL MENISCECTOMY  . Cardiac catheterization      Family History  Problem Relation Age of Onset  . Heart disease Father 33    MI  . Heart attack Father   . Stomach cancer Father   . Hypertension Mother   . Breast cancer Mother    Social History:  reports that he has never smoked. He has never used smokeless tobacco. He reports that he does not drink alcohol or use illicit drugs. Family history: no epilepsy, MS, or brain tumor. Allergies:  Allergies  Allergen Reactions  . Statins Rash         Medications:  I have reviewed the patient's current medications.  ROS:                                                                                                                                       History obtained from wife, chart review and the patient  General ROS: negative for - chills, fatigue, fever, night sweats, or weight loss Psychological ROS: negative for - behavioral disorder, hallucinations, memory difficulties, mood swings or suicidal ideation Ophthalmic ROS: negative for - blurry vision, double vision, eye pain or loss of vision ENT ROS: negative for - epistaxis, nasal discharge, oral lesions, sore throat, tinnitus or vertigo Allergy and Immunology ROS: negative for - hives or itchy/watery eyes Hematological and Lymphatic ROS: negative for - bleeding problems, bruising or swollen lymph nodes Endocrine ROS: negative for - galactorrhea, hair pattern changes, polydipsia/polyuria or temperature intolerance Respiratory ROS: negative for - cough, hemoptysis, shortness of breath or wheezing Cardiovascular ROS: negative for - chest pain, dyspnea on exertion, edema or irregular  heartbeat Gastrointestinal ROS: negative for - abdominal pain, diarrhea, hematemesis, nausea/vomiting or stool incontinence Genito-Urinary ROS: negative for - dysuria, hematuria, incontinence or urinary frequency/urgency Musculoskeletal ROS: negative for - joint swelling or muscular weakness Neurological ROS: as noted in HPI Dermatological ROS: negative for rash and skin lesion changes  Physical exam:  Constitutional: well developed, pleasant male in no apparent distress. Blood pressure 204/114, pulse 80, temperature 98.7 F (37.1 C), temperature source Oral, resp. rate 20, height 6\' 2"  (1.88 m), weight 141.069 kg (311 lb), SpO2 100 %. Eyes: no jaundice or exophthalmos.  Head: normocephalic. Neck: supple, no bruits, no JVD. Cardiac: no murmurs. Lungs: clear. Abdomen: soft, no tender, no mass. Extremities: no edema, clubbing, or cyanosis.  Skin: no rash  Neurologic Examination:                                                                                                      General: NAD Mental Status: Alert, oriented, thought content appropriate.  Speech fluent without evidence of aphasia.  Able to follow 3 step commands without difficulty. Cranial Nerves: II: Discs flat bilaterally; Visual fields grossly normal, pupils equal, round, reactive to light and accommodation III,IV, VI: ptosis not present, extra-ocular motions intact bilaterally V,VII: smile symmetric, facial light touch sensation normal bilaterally VIII: hearing normal bilaterally IX,X: uvula rises symmetrically XI: bilateral shoulder shrug XII: midline tongue extension without atrophy or fasciculations Motor: Mild left LE weakness but no full effort by patient Tone and  bulk:normal tone throughout; no atrophy noted Sensory: Pinprick and light touch intact diminished in the left side Deep Tendon Reflexes:  Right: Upper Extremity   Left: Upper extremity   biceps (C-5 to C-6) 2/4   biceps (C-5 to C-6) 2/4 tricep (C7)  2/4    triceps (C7) 2/4 Brachioradialis (C6) 2/4  Brachioradialis (C6) 2/4  Lower Extremity Lower Extremity  quadriceps (L-2 to L-4) 2/4   quadriceps (L-2 to L-4) 2/4 Achilles (S1) 2/4   Achilles (S1) 2/4  Plantars: Right: downgoing   Left: downgoing Cerebellar: normal finger-to-nose,  normal heel-to-shin test Gait:  No tested due to multiple leads    Results for orders placed or performed during the hospital encounter of 01/06/15 (from the past 48 hour(s))  Protime-INR     Status: None   Collection Time: 01/06/15  8:51 PM  Result Value Ref Range   Prothrombin Time 13.7 11.6 - 15.2 seconds   INR 1.03 0.00 - 1.49  APTT     Status: None   Collection Time: 01/06/15  8:51 PM  Result Value Ref Range   aPTT 28 24 - 37 seconds  CBC     Status: Abnormal   Collection Time: 01/06/15  8:51 PM  Result Value Ref Range   WBC 10.7 (H) 4.0 - 10.5 K/uL   RBC 4.98 4.22 - 5.81 MIL/uL   Hemoglobin 14.6 13.0 - 17.0 g/dL   HCT 43.1 39.0 - 52.0 %   MCV 86.5 78.0 - 100.0 fL   MCH 29.3 26.0 - 34.0 pg   MCHC 33.9 30.0 - 36.0 g/dL   RDW 13.8 11.5 - 15.5 %   Platelets 236 150 - 400 K/uL  Differential     Status: Abnormal   Collection Time: 01/06/15  8:51 PM  Result Value Ref Range   Neutrophils Relative % 49 %   Neutro Abs 5.1 1.7 - 7.7 K/uL   Lymphocytes Relative 44 %   Lymphs Abs 4.8 (H) 0.7 - 4.0 K/uL   Monocytes Relative 6 %   Monocytes Absolute 0.7 0.1 - 1.0 K/uL   Eosinophils Relative 1 %   Eosinophils Absolute 0.2 0.0 - 0.7 K/uL   Basophils Relative 0 %   Basophils Absolute 0.0 0.0 - 0.1 K/uL  I-stat troponin, ED (not at [facility], [facility])     Status: None   Collection Time: 01/07/15 12:02 AM  Result Value Ref Range   Troponin i, poc 0.00 0.00 - 0.08 ng/mL   Comment 3            Comment: Due to the release kinetics of cTnI, a negative result within the first hours of the onset of symptoms does not rule out myocardial infarction with certainty. If myocardial infarction is still  suspected, repeat the test at appropriate intervals.   I-Stat Chem 8, ED  (not at [facility], [facility])     Status: Abnormal   Collection Time: 01/07/15 12:05 AM  Result Value Ref Range   Sodium 139 135 - 145 mmol/L   Potassium 3.8 3.5 - 5.1 mmol/L   Chloride 100 (L) 101 - 111 mmol/L   BUN 14 6 - 20 mg/dL   Creatinine, Ser 1.00 0.61 - 1.24 mg/dL   Glucose, Bld 212 (H) 65 - 99 mg/dL   Calcium, Ion 1.11 (L) 1.12 - 1.23 mmol/L   TCO2 26 0 - 100 mmol/L   Hemoglobin 16.0 13.0 - 17.0 g/dL   HCT 47.0 39.0 - 52.0 %   Ct Head Wo Contrast  01/07/2015  CLINICAL DATA:  49 year old male with headache dizziness altered mental status and left jaw pain. Code stroke. EXAM: CT HEAD WITHOUT CONTRAST TECHNIQUE: Contiguous axial images were obtained from the base of the skull through the vertex without intravenous contrast. COMPARISON:  Head CT dated 05/25/2011 FINDINGS: The ventricles and the sulci are appropriate in size for the patient's age. There is no intracranial hemorrhage. No midline shift or mass effect identified. The gray-white matter differentiation is preserved. The visualized paranasal sinuses and mastoid air cells are well aerated. The calvarium is intact. A nasogastric tube is partially visualized. IMPRESSION: No acute intracranial pathology. These results were called by telephone at the time of interpretation on 01/07/2015 at 12:14 am to Dr. Jola Schmidt , who verbally acknowledged these results. Electronically Signed   By: Anner Crete M.D.   On: 01/07/2015 00:14    Assessment: 49 y.o. male with multiple risk factors for stroke, brought in as a code stroke due to acute onset severe HA, decreased responsiveness. SBP>200 in the ED. NIHSS 5 but some inconsistencies on exam. CT brain without acute abnormality. Likely hypertensive urgency, but can not ruled out ischemic stroke due to patient multiple risk factors or PRES. MRI brain with further work up if evidence of acute stroke. Will follow up after  MRI.  Stroke Risk Factors - HTN, DM, HLD, CAD, cardiomyopathy, and obesity    Dorian Pod, MD [facility] [phone]  01/07/2015, 12:27 AM

## 2015-01-07 NOTE — ED Notes (Signed)
Pt from home by [facility]. Pt last know well at 2130 on Saturday evening. EMS found pt unresponsive but maintain his own air way, pt came around about 47mins after being there.  Pt complains of 10/10 headache. Pt also complains of numbness on the left side of face.

## 2015-01-07 NOTE — ED Notes (Signed)
Pt.walked no assitances just remind pt to look up not lookdown while walking easy to fall.and get off balance

## 2015-01-09 ENCOUNTER — Encounter: Payer: Self-pay | Admitting: Medical Oncology

## 2015-01-09 ENCOUNTER — Emergency Department: Payer: 59

## 2015-01-09 ENCOUNTER — Emergency Department
Admission: EM | Admit: 2015-01-09 | Discharge: 2015-01-09 | Disposition: A | Discharge status: Home / Self Care | Payer: 59 | Attending: Emergency Medicine | Admitting: Emergency Medicine

## 2015-01-09 DIAGNOSIS — I10 Essential (primary) hypertension: Secondary | ICD-10-CM | POA: Diagnosis not present

## 2015-01-09 DIAGNOSIS — Z7982 Long term (current) use of aspirin: Secondary | ICD-10-CM | POA: Diagnosis not present

## 2015-01-09 DIAGNOSIS — E119 Type 2 diabetes mellitus without complications: Secondary | ICD-10-CM | POA: Diagnosis not present

## 2015-01-09 DIAGNOSIS — R51 Headache: Secondary | ICD-10-CM | POA: Diagnosis not present

## 2015-01-09 DIAGNOSIS — R531 Weakness: Secondary | ICD-10-CM | POA: Diagnosis not present

## 2015-01-09 DIAGNOSIS — H5712 Ocular pain, left eye: Secondary | ICD-10-CM | POA: Diagnosis not present

## 2015-01-09 DIAGNOSIS — Z79899 Other long term (current) drug therapy: Secondary | ICD-10-CM | POA: Insufficient documentation

## 2015-01-09 DIAGNOSIS — R519 Headache, unspecified: Secondary | ICD-10-CM

## 2015-01-09 LAB — BASIC METABOLIC PANEL
Anion gap: 8 (ref 5–15)
BUN: 15 mg/dL (ref 6–20)
CHLORIDE: 104 mmol/L (ref 101–111)
CO2: 25 mmol/L (ref 22–32)
CREATININE: 0.92 mg/dL (ref 0.61–1.24)
Calcium: 9.3 mg/dL (ref 8.9–10.3)
GFR calc Af Amer: >60 mL/min (ref >60)
GFR calc non Af Amer: >60 mL/min (ref >60)
Glucose, Bld: 148 mg/dL — ABNORMAL HIGH (ref 65–99)
POTASSIUM: 3.8 mmol/L (ref 3.5–5.1)
Sodium: 137 mmol/L (ref 135–145)

## 2015-01-09 LAB — CBC
HEMATOCRIT: 42.7 % (ref 40.0–52.0)
HEMOGLOBIN: 14.7 g/dL (ref 13.0–18.0)
MCH: 29.5 pg (ref 26.0–34.0)
MCHC: 34.4 g/dL (ref 32.0–36.0)
MCV: 85.8 fL (ref 80.0–100.0)
Platelets: 222 10*3/uL (ref 150–440)
RBC: 4.98 MIL/uL (ref 4.40–5.90)
RDW: 14.6 % — ABNORMAL HIGH (ref 11.5–14.5)
WBC: 9.9 10*3/uL (ref 3.8–10.6)

## 2015-01-09 LAB — TROPONIN I

## 2015-01-09 MED ORDER — LISINOPRIL 5 MG PO TABS: 5.0000 mg | ORAL_TABLET | Freq: Every day | ORAL | Status: DC

## 2015-01-09 MED ORDER — TETRACAINE HCL 0.5 % OP SOLN
2.0000 [drp] | Freq: Once | OPHTHALMIC | Status: AC
  Administered 2015-01-09: 2 [drp] via OPHTHALMIC
  Filled 2015-01-09: qty 2

## 2015-01-09 MED ORDER — KETOROLAC TROMETHAMINE 30 MG/ML IJ SOLN
30.0000 mg | Freq: Once | INTRAMUSCULAR | Status: AC
  Administered 2015-01-09: 30 mg via INTRAVENOUS
  Filled 2015-01-09: qty 1

## 2015-01-09 MED ORDER — DIPHENHYDRAMINE HCL 50 MG/ML IJ SOLN
50.0000 mg | Freq: Once | INTRAMUSCULAR | Status: AC
  Administered 2015-01-09: 50 mg via INTRAVENOUS
  Filled 2015-01-09: qty 1

## 2015-01-09 MED ORDER — SODIUM CHLORIDE 0.9 % IV BOLUS (SEPSIS)
1000.0000 mL | Freq: Once | INTRAVENOUS | Status: AC
  Administered 2015-01-09: 1000 mL via INTRAVENOUS

## 2015-01-09 MED ORDER — LISINOPRIL 10 MG PO TABS
10.0000 mg | ORAL_TABLET | Freq: Once | ORAL | Status: AC
  Administered 2015-01-09: 10 mg via ORAL
  Filled 2015-01-09: qty 1

## 2015-01-09 MED ORDER — HYDROCHLOROTHIAZIDE 25 MG PO TABS: 25.0000 mg | ORAL_TABLET | Freq: Every day | ORAL | Status: DC

## 2015-01-09 MED ORDER — METOCLOPRAMIDE HCL 5 MG/ML IJ SOLN
10.0000 mg | Freq: Once | INTRAMUSCULAR | Status: AC
  Administered 2015-01-09: 10 mg via INTRAVENOUS
  Filled 2015-01-09: qty 2

## 2015-01-09 NOTE — ED Provider Notes (Addendum)
[facility] Emergency Department Provider Note  Time seen: 4:28 PM  I have reviewed the triage vital signs and the nursing notes.   HISTORY  Chief Complaint Headache and Hypertension    HPI Gerald [name] is a 49 y.o. male for the past medical history of hypertension, hyperlipidemia, MI, obesity, diabetes, cardiomyopathy, presents the emergency department with headache, left eye pain, and weakness. According to the patient had a similar headache 3 days ago, at that time he states he took his blood pressure and it was 170/115, and he became "unresponsive." He was taken by EMS to [facility] where he was evaluated for CVA. In reviewing the patient's records he had a normal workup with normal MRI, the patient was ultimately discharged home. States he has been well at home, the headache had largely resolved, I pain had largely resolved, however his blood pressure has remained elevated in the 140s/100 range. Today he states his headache returned, along with left eye pain, he called his primary care doctor but they cannot see him until next week and they referred him to the emergency department for immediate evaluation. Patient denies any focal weakness or numbness. Does state generalized fatigue. Describes the headache as moderate, left eye pain/pressure as moderate.     Past Medical History  Diagnosis Date  . Myocardial infarction ([place]) 09/2011  . Hypertension   . Hyperlipidemia   . Obesity   . Diabetes mellitus without complication (HCC)     borderline  . Nonischemic cardiomyopathy (HCC)     Previous ejection fraction of 35-40% on echo. 50% on cardiac catheterization in August of 2013  . Sleep apnea   . Coronary artery disease     Mild nonobstructive coronary artery disease on previous cardiac catheterization most recently in August of 2013  . Gastritis   . Hepatic steatosis   . Abnormal LFTs   . GERD (gastroesophageal reflux disease)   . Fundic gland  polyps of stomach, benign     Patient Active Problem List   Diagnosis Date Noted  . Other chest pain 05/22/2014  . Mass of jaw 05/22/2014  . Right flank pain 11/23/2013  . Dysuria 11/23/2013  . Encounter for wellness examination 11/08/2013  . Retaining fluid 11/08/2013  . Urinary incontinence 03/14/2013  . Depression 01/13/2013  . Type 2 diabetes mellitus ([place]) 09/14/2012  . Nonischemic cardiomyopathy ([place])   . HLD (hyperlipidemia) 05/28/2012  . CAD (coronary artery disease) 03/30/2012  . Chronic pain syndrome 03/30/2012  . HTN (hypertension) 03/30/2012    Past Surgical History  Procedure Laterality Date  . Colonoscopy    . Cardiac catheterization N/A 09/2011    [facility]; EF 50% with 30% mid LAD stenosis and no obstructive disease.  . Cardiac catheterization  09/2010    [facility]; Mid LAD 40% stenosis; Mid Circumflex:Normal; Mid RCA; Normal  . Knee arthroscopy with medial menisectomy Right 09/16/2012    Procedure: RIGHT KNEE ARTHROSCOPY WITH MEDIAL AND LATERAL MENISECTOMY, CHONDROPLASTY;  Surgeon: Ninetta Lights, MD;  Location: [place];  Service: Orthopedics;  Laterality: Right;  RIGHT KNEE SCOPE MEDIAL MENISCECTOMY  . Cardiac catheterization      Current Outpatient Rx  Name  Route  Sig  Dispense  Refill  . albuterol (PROVENTIL HFA) 108 (90 BASE) MCG/ACT inhaler   Inhalation   Inhale 2 puffs into the lungs every 4 (four) hours as needed for wheezing or shortness of breath.   1 Inhaler   0   . aspirin  81 MG EC tablet   Oral   Take 81 mg by mouth every evening. Swallow whole.         . citalopram (CELEXA) 20 MG tablet   Oral   Take 1 tablet (20 mg total) by mouth daily.   90 tablet   0   . Cyanocobalamin (VITAMIN B-12) 5000 MCG SUBL   Sublingual   Place 2 tablets under the tongue daily.         . furosemide (LASIX) 20 MG tablet   Oral   Take 1 tablet (20 mg total) by mouth daily.   90 tablet   1   . gemfibrozil (LOPID) 600 MG tablet   Oral    Take 1 tablet (600 mg total) by mouth 2 (two) times daily before a meal. Patient not taking: Reported on 06/13/2014   180 tablet   1   . glucose blood test strip      Use as instructed   100 each   12   . Liraglutide 18 MG/3ML SOPN   Subcutaneous   Inject 1.8 mg into the skin daily.         Marland Kitchen lisinopril (PRINIVIL,ZESTRIL) 5 MG tablet   Oral   Take 1 tablet (5 mg total) by mouth daily. Patient not taking: Reported on 01/07/2015   90 tablet   1   . metoprolol succinate (TOPROL-XL) 25 MG 24 hr tablet   Oral   Take 1 tablet (25 mg total) by mouth daily.   90 tablet   1   . naproxen (NAPROSYN) 500 MG tablet   Oral   Take 1 tablet (500 mg total) by mouth 2 (two) times daily with a meal. Patient not taking: Reported on 01/07/2015   20 tablet   0   . nitroGLYCERIN (NITROSTAT) 0.4 MG SL tablet   Sublingual   Place 1 tablet (0.4 mg total) under the tongue every 5 (five) minutes as needed for chest pain.   75 tablet   1   . omeprazole (PRILOSEC) 40 MG capsule      TAKE 1 CAPSULE (40 MG TOTAL) BY MOUTH DAILY. Patient not taking: Reported on 01/07/2015   30 capsule   6   . potassium chloride SA (K-DUR,KLOR-CON) 20 MEQ tablet   Oral   Take 1 tablet (20 mEq total) by mouth daily.   90 tablet   1   . tamsulosin (FLOMAX) 0.4 MG CAPS capsule   Oral   Take 1 capsule (0.4 mg total) by mouth daily. Patient not taking: Reported on 06/13/2014   30 capsule   0     Allergies Statins  Family History  Problem Relation Age of Onset  . Heart disease Father 85    MI  . Heart attack Father   . Stomach cancer Father   . Hypertension Mother   . Breast cancer Mother     Social History Social History  Substance Use Topics  . Smoking status: Never Smoker   . Smokeless tobacco: Never Used  . Alcohol Use: No     Comment: rare    Review of Systems Constitutional: Negative for fever. Eyes:  Blood vessels busted in left eye 3 days. Cardiovascular: Negative for chest  pain. Respiratory: Negative for shortness of breath. Gastrointestinal: Negative for abdominal pain Genitourinary: Negative for dysuria. Musculoskeletal: Negative for back pain. Neurological: Negative for headache 10-point ROS otherwise negative.  ____________________________________________   PHYSICAL EXAM:  VITAL SIGNS: ED Triage Vitals  Enc Vitals Group  BP 01/09/15 1608 142/97 mmHg     Pulse Rate 01/09/15 1608 86     Resp 01/09/15 1608 18     Temp 01/09/15 1608 98.3 F (36.8 C)     Temp Source 01/09/15 1608 Oral     SpO2 01/09/15 1608 97 %     Weight 01/09/15 1608 311 lb (141.069 kg)     Height 01/09/15 1608 6\' 2"  (1.88 m)     Head Cir --      Peak Flow --      Pain Score 01/09/15 1608 7     Pain Loc --      Pain Edu? --      Excl. in [place]? --     Constitutional: Alert and oriented. Well appearing and in no distress. Eyes: Normal exam ENT   Head: Normocephalic and atraumatic.   Mouth/Throat: Mucous membranes are moist. Cardiovascular: Normal rate, regular rhythm. No murmur Respiratory: Normal respiratory effort without tachypnea nor retractions. Breath sounds are clear and equal bilaterally. No wheezes/rales/rhonchi. Gastrointestinal: Soft and nontender. No distention.   Musculoskeletal: Nontender with normal range of motion in all extremities. Neurologic:  Normal speech and language. No gross focal neurologic deficits are appreciated. Speech is normal. equal grip strengths bilaterally. PERRL, no facial droop noted. No pronator drift. Skin:  Skin is warm, dry and intact.  Psychiatric: Mood and affect are normal. Speech and behavior are normal.  ____________________________________________    EKG  EKG reviewed and interpreted by myself shows normal sinus rhythm at 79 bpm, narrow QRS, mild right axis deviation, nonspecific ST changes.  ____________________________________________    RADIOLOGY  CT  negative  ____________________________________________    INITIAL IMPRESSION / ASSESSMENT AND PLAN / ED COURSE  Pertinent labs & imaging results that were available during my care of the patient were reviewed by me and considered in my medical decision making (see chart for details).  Patient with headache, left eye pressure, recent workup at [facility] 3 days ago with negative MRI. Given his increased headache today, we will obtain a CT head. We will check basic labs, treat the patient's headache with Toradol, Reglan, Benadryl. Patient's blood pressure is currently 140/97. Patient was taking lisinopril for his blood pressure in addition to metoprolol, but his primary care doctor took him off this several years ago. We'll place the patient back on lisinopril. He denies any cough or angioedema history.  Tonometry pressures of the left eye are 13, 14. He states his eye pressure is much improved as well.  CT and labs are within normal limits. Patient states his headache is much improved. We will discharge home with new blood pressure medication, lisinopril 5 mg daily. Patient is follow up with his primary care physician in 2-3 days for recheck.  ____________________________________________   FINAL CLINICAL IMPRESSION(S) / ED DIAGNOSES  Hypertension Headache  Harvest Dark, MD 01/09/15 1815  Harvest Dark, MD 01/09/15 [id number]

## 2015-01-09 NOTE — ED Notes (Signed)
MD Paduchowski at bedside  

## 2015-01-09 NOTE — ED Notes (Signed)
Pt was seen at [place] Saturday for possible stroke and discharged, pt was told to continue taking BP meds and monitor BP. Pt reports he has continued to have headaches and today checked his bp and it was still elevated.

## 2015-01-09 NOTE — Discharge Instructions (Signed)
General Headache Without Cause A headache is pain or discomfort felt around the head or neck area. The specific cause of a headache may not be found. There are many causes and types of headaches. A few common ones are:  Tension headaches.  Migraine headaches.  Cluster headaches.  Chronic daily headaches. HOME CARE INSTRUCTIONS  Watch your condition for any changes. Take these steps to help with your condition: Managing Pain  Take over-the-counter and prescription medicines only as told by your health care provider.  Lie down in a dark, quiet room when you have a headache.  If directed, apply ice to the head and neck area:  Put ice in a plastic bag.  Place a towel between your skin and the bag.  Leave the ice on for 20 minutes, 2-3 times per day.  Use a heating pad or hot shower to apply heat to the head and neck area as told by your health care provider.  Keep lights dim if bright lights bother you or make your headaches worse. Eating and Drinking  Eat meals on a regular schedule.  Limit alcohol use.  Decrease the amount of caffeine you drink, or stop drinking caffeine. General Instructions  Keep all follow-up visits as told by your health care provider. This is important.  Keep a headache journal to help find out what may trigger your headaches. For example, write down:  What you eat and drink.  How much sleep you get.  Any change to your diet or medicines.  Try massage or other relaxation techniques.  Limit stress.  Sit up straight, and do not tense your muscles.  Do not use tobacco products, including cigarettes, chewing tobacco, or e-cigarettes. If you need help quitting, ask your health care provider.  Exercise regularly as told by your health care provider.  Sleep on a regular schedule. Get 7-9 hours of sleep, or the amount recommended by your health care provider. SEEK MEDICAL CARE IF:   Your symptoms are not helped by medicine.  You have a  headache that is different from the usual headache.  You have nausea or you vomit.  You have a fever. SEEK IMMEDIATE MEDICAL CARE IF:   Your headache becomes severe.  You have repeated vomiting.  You have a stiff neck.  You have a loss of vision.  You have problems with speech.  You have pain in the eye or ear.  You have muscular weakness or loss of muscle control.  You lose your balance or have trouble walking.  You feel faint or pass out.  You have confusion.   This information is not intended to replace advice given to you by your health care provider. Make sure you discuss any questions you have with your health care provider.   Document Released: 01/20/2005 Document Revised: 10/11/2014 Document Reviewed: 05/15/2014 Elsevier Interactive Patient Education 2016 [employer].  Hypertension Hypertension is another name for high blood pressure. High blood pressure forces your heart to work harder to pump blood. A blood pressure reading has two numbers, which includes a higher number over a lower number (example: 110/72). HOME CARE   Have your blood pressure rechecked by your doctor.  Only take medicine as told by your doctor. Follow the directions carefully. The medicine does not work as well if you skip doses. Skipping doses also puts you at risk for problems.  Do not smoke.  Monitor your blood pressure at home as told by your doctor. GET HELP IF:  You think you  are having a reaction to the medicine you are taking.  You have repeat headaches or feel dizzy.  You have puffiness (swelling) in your ankles.  You have trouble with your vision. GET HELP RIGHT AWAY IF:   You get a very bad headache and are confused.  You feel weak, numb, or faint.  You get chest or belly (abdominal) pain.  You throw up (vomit).  You cannot breathe very well. MAKE SURE YOU:   Understand these instructions.  Will watch your condition.  Will get help right away if you are  not doing well or get worse.   This information is not intended to replace advice given to you by your health care provider. Make sure you discuss any questions you have with your health care provider.   Document Released: 07/09/2007 Document Revised: 01/25/2013 Document Reviewed: 11/12/2012 Elsevier Interactive Patient Education [employer].

## 2015-06-12 ENCOUNTER — Emergency Department: Payer: 59

## 2015-06-12 ENCOUNTER — Emergency Department
Admission: EM | Admit: 2015-06-12 | Discharge: 2015-06-12 | Disposition: A | Discharge status: Home / Self Care | Payer: 59 | Attending: Emergency Medicine | Admitting: Emergency Medicine

## 2015-06-12 ENCOUNTER — Telehealth: Payer: Self-pay

## 2015-06-12 ENCOUNTER — Encounter: Payer: Self-pay | Admitting: Emergency Medicine

## 2015-06-12 DIAGNOSIS — F329 Major depressive disorder, single episode, unspecified: Secondary | ICD-10-CM | POA: Diagnosis not present

## 2015-06-12 DIAGNOSIS — I2510 Atherosclerotic heart disease of native coronary artery without angina pectoris: Secondary | ICD-10-CM | POA: Insufficient documentation

## 2015-06-12 DIAGNOSIS — R079 Chest pain, unspecified: Secondary | ICD-10-CM

## 2015-06-12 DIAGNOSIS — J40 Bronchitis, not specified as acute or chronic: Secondary | ICD-10-CM

## 2015-06-12 DIAGNOSIS — E669 Obesity, unspecified: Secondary | ICD-10-CM | POA: Insufficient documentation

## 2015-06-12 DIAGNOSIS — I252 Old myocardial infarction: Secondary | ICD-10-CM | POA: Diagnosis not present

## 2015-06-12 DIAGNOSIS — Z7982 Long term (current) use of aspirin: Secondary | ICD-10-CM | POA: Insufficient documentation

## 2015-06-12 DIAGNOSIS — E119 Type 2 diabetes mellitus without complications: Secondary | ICD-10-CM | POA: Diagnosis not present

## 2015-06-12 DIAGNOSIS — E785 Hyperlipidemia, unspecified: Secondary | ICD-10-CM | POA: Diagnosis not present

## 2015-06-12 DIAGNOSIS — R0789 Other chest pain: Secondary | ICD-10-CM | POA: Diagnosis present

## 2015-06-12 DIAGNOSIS — Z79899 Other long term (current) drug therapy: Secondary | ICD-10-CM | POA: Insufficient documentation

## 2015-06-12 DIAGNOSIS — I10 Essential (primary) hypertension: Secondary | ICD-10-CM | POA: Insufficient documentation

## 2015-06-12 LAB — COMPREHENSIVE METABOLIC PANEL
ALBUMIN: 3.8 g/dL (ref 3.5–5.0)
ALT: 90 U/L — ABNORMAL HIGH (ref 17–63)
ANION GAP: 8 (ref 5–15)
AST: 61 U/L — AB (ref 15–41)
Alkaline Phosphatase: 68 U/L (ref 38–126)
BILIRUBIN TOTAL: 0.4 mg/dL (ref 0.3–1.2)
BUN: 15 mg/dL (ref 6–20)
CHLORIDE: 100 mmol/L — AB (ref 101–111)
CO2: 27 mmol/L (ref 22–32)
Calcium: 9 mg/dL (ref 8.9–10.3)
Creatinine, Ser: 1.03 mg/dL (ref 0.61–1.24)
GFR calc Af Amer: >60 mL/min (ref >60)
GFR calc non Af Amer: >60 mL/min (ref >60)
GLUCOSE: 191 mg/dL — AB (ref 65–99)
POTASSIUM: 3.9 mmol/L (ref 3.5–5.1)
Sodium: 135 mmol/L (ref 135–145)
TOTAL PROTEIN: 7 g/dL (ref 6.5–8.1)

## 2015-06-12 LAB — CBC
HEMATOCRIT: 38.8 % — AB (ref 40.0–52.0)
HEMOGLOBIN: 13.4 g/dL (ref 13.0–18.0)
MCH: 30.7 pg (ref 26.0–34.0)
MCHC: 34.6 g/dL (ref 32.0–36.0)
MCV: 88.7 fL (ref 80.0–100.0)
PLATELETS: 203 10*3/uL (ref 150–440)
RBC: 4.38 MIL/uL — AB (ref 4.40–5.90)
RDW: 14.4 % (ref 11.5–14.5)
WBC: 8 10*3/uL (ref 3.8–10.6)

## 2015-06-12 LAB — TROPONIN I: Troponin I: <0.03 ng/mL (ref <0.031)

## 2015-06-12 MED ORDER — ASPIRIN 81 MG PO CHEW
324.0000 mg | CHEWABLE_TABLET | Freq: Once | ORAL | Status: AC
  Administered 2015-06-12: 324 mg via ORAL
  Filled 2015-06-12: qty 4

## 2015-06-12 MED ORDER — OXYCODONE-ACETAMINOPHEN 5-325 MG PO TABS
2.0000 | ORAL_TABLET | Freq: Once | ORAL | Status: AC
  Administered 2015-06-12: 2 via ORAL
  Filled 2015-06-12: qty 2

## 2015-06-12 MED ORDER — NITROGLYCERIN 0.4 MG SL SUBL
0.4000 mg | SUBLINGUAL_TABLET | SUBLINGUAL | Status: DC | PRN
  Administered 2015-06-12: 0.4 mg via SUBLINGUAL
  Filled 2015-06-12: qty 1

## 2015-06-12 MED ORDER — HYDROCOD POLST-CPM POLST ER 10-8 MG/5ML PO SUER: 5.0000 mL | Freq: Two times a day (BID) | ORAL | Status: DC

## 2015-06-12 MED ORDER — AZITHROMYCIN 250 MG PO TABS: ORAL_TABLET | ORAL | Status: DC

## 2015-06-12 NOTE — Telephone Encounter (Signed)
Spoke w/ pt.  He would prefer to proceed w/ stress test ASAP. Pt sched for ETT [facility] 06/15/15 @ 7:30.   Pt understands instructions and would like to set up new pt appt after his test.

## 2015-06-12 NOTE — Consult Note (Signed)
[facility] NAME: Gerald [name]    MR#:  [id number]  DATE OF BIRTH:  1965-03-06  DATE OF ADMISSION:  06/12/2015  PRIMARY CARE PHYSICIAN: Ronnell Freshwater, NP   REQUESTING/REFERRING PHYSICIAN: Dr. Lenise Arena    Chief Complaint  Patient presents with  . Chest Pain    HISTORY OF PRESENT ILLNESS:  Gerald [name]  is a 50 y.o. male with a known history of essential hypertension, anxiety came in because of chest pain. Patient had lots of cough and pleuritic chest pain since 2 days. Troponins are negative for 2 times. Patient chest pain is in the below of the chest does not radiate into her neck or shoulders. Did have lots of cough over the weekend and her shortness of breath. No fevers. No nausea no vomiting. Wife recently recovered from pneumonia and she got him here thinking that he might be having pneumonia. Her consulted for chest pain and evaluate  angina.. Pt was given aspirin, nitrates without relief in the emergency room.  PAST MEDICAL HISTORY:   Past Medical History  Diagnosis Date  . Myocardial infarction ([place]) 09/2011  . Hypertension   . Hyperlipidemia   . Obesity   . Diabetes mellitus without complication (HCC)     borderline  . Nonischemic cardiomyopathy (HCC)     Previous ejection fraction of 35-40% on echo. 50% on cardiac catheterization in August of 2013  . Sleep apnea   . Coronary artery disease     Mild nonobstructive coronary artery disease on previous cardiac catheterization most recently in August of 2013  . Gastritis   . Hepatic steatosis   . Abnormal LFTs   . GERD (gastroesophageal reflux disease)   . Fundic gland polyps of stomach, benign     PAST SURGICAL HISTOIRY:   Past Surgical History  Procedure Laterality Date  . Colonoscopy    . Cardiac catheterization N/A 09/2011    [facility]; EF 50% with 30% mid LAD stenosis and no obstructive disease.  . Cardiac catheterization  09/2010    [facility];  Mid LAD 40% stenosis; Mid Circumflex:Normal; Mid RCA; Normal  . Knee arthroscopy with medial menisectomy Right 09/16/2012    Procedure: RIGHT KNEE ARTHROSCOPY WITH MEDIAL AND LATERAL MENISECTOMY, CHONDROPLASTY;  Surgeon: Ninetta Lights, MD;  Location: [place];  Service: Orthopedics;  Laterality: Right;  RIGHT KNEE SCOPE MEDIAL MENISCECTOMY  . Cardiac catheterization      SOCIAL HISTORY:   Social History  Substance Use Topics  . Smoking status: Never Smoker   . Smokeless tobacco: Never Used  . Alcohol Use: No     Comment: rare    FAMILY HISTORY:   Family History  Problem Relation Age of Onset  . Heart disease Father 17    MI  . Heart attack Father   . Stomach cancer Father   . Hypertension Mother   . Breast cancer Mother     DRUG ALLERGIES:   Allergies  Allergen Reactions  . Statins Rash         REVIEW OF SYSTEMS:  CONSTITUTIONAL:Has fatigue.  EYES: No blurred or double vision.  EARS, NOSE, AND THROAT: No tinnitus or ear pain.  RESPIRATORY:dry cough, pleuritic chest pain for 2 days.  CARDIOVASCULAR: No chest pain, orthopnea, edema.  GASTROINTESTINAL: No nausea, vomiting, diarrhea or abdominal pain.  GENITOURINARY: No dysuria, hematuria.  ENDOCRINE: No polyuria, nocturia,  HEMATOLOGY: No anemia, easy bruising or bleeding SKIN: No rash or lesion.  MUSCULOSKELETAL: No joint pain or arthritis.   NEUROLOGIC: No tingling, numbness, weakness.  PSYCHIATRY: No anxiety or depression.   MEDICATIONS AT HOME:   Prior to Admission medications   Medication Sig Start Date End Date Taking? Authorizing Provider  albuterol (PROVENTIL HFA) 108 (90 BASE) MCG/ACT inhaler Inhale 2 puffs into the lungs every 4 (four) hours as needed for wheezing or shortness of breath. 12/30/14  Yes Carrie Mew, MD  aspirin EC 81 MG tablet Take 81 mg by mouth daily.   Yes Historical Provider, MD  Cholecalciferol (VITAMIN D3) 5000 UNITS CAPS Take 5,000 Units by mouth daily.   Yes  Historical Provider, MD  citalopram (CELEXA) 20 MG tablet Take 1 tablet (20 mg total) by mouth daily. 01/31/14  Yes Jearld Fenton, NP  Cyanocobalamin (VITAMIN B-12) 5000 MCG SUBL Place 5,000 mcg under the tongue daily.    Yes Historical Provider, MD  furosemide (LASIX) 20 MG tablet Take 1 tablet (20 mg total) by mouth daily. 11/08/13  Yes Lucille Passy, MD  Liraglutide (VICTOZA) 18 MG/3ML SOPN Inject 1.2 mg into the skin daily.   Yes Historical Provider, MD  lisinopril (PRINIVIL,ZESTRIL) 5 MG tablet Take 1 tablet (5 mg total) by mouth daily. 01/09/15 01/09/16 Yes Harvest Dark, MD  lisinopril-hydrochlorothiazide (PRINZIDE,ZESTORETIC) 10-12.5 MG tablet Take 1 tablet by mouth daily. 05/31/15  Yes Historical Provider, MD  metoprolol succinate (TOPROL-XL) 25 MG 24 hr tablet Take 1 tablet (25 mg total) by mouth daily. 11/08/13  Yes Lucille Passy, MD  nitroGLYCERIN (NITROSTAT) 0.4 MG SL tablet Place 1 tablet (0.4 mg total) under the tongue every 5 (five) minutes as needed for chest pain. 11/08/13  Yes Lucille Passy, MD  omeprazole (PRILOSEC) 40 MG capsule Take 40 mg by mouth daily.   Yes Historical Provider, MD  ondansetron (ZOFRAN-ODT) 4 MG disintegrating tablet Take 4 mg by mouth every 8 (eight) hours as needed for nausea or vomiting.  11/28/13  Yes Historical Provider, MD  potassium chloride SA (K-DUR,KLOR-CON) 20 MEQ tablet Take 1 tablet (20 mEq total) by mouth daily. 11/08/13  Yes Lucille Passy, MD      VITAL SIGNS:  Blood pressure 116/70, pulse 74, temperature 98.2 F (36.8 C), temperature source Oral, resp. rate 16, height 6\' 2"  (1.88 m), weight 158.759 kg (350 lb), SpO2 97 %.  PHYSICAL EXAMINATION:  GENERAL:  50 y.o.-year-old patient lying in the bed with no acute distress.  EYES: Pupils equal, round, reactive to light and accommodation. No scleral icterus. Extraocular muscles intact.  HEENT: Head atraumatic, normocephalic. Oropharynx and nasopharynx clear.  NECK:  Supple, no jugular venous  distention. No thyroid enlargement, no tenderness.  LUNGS: Normal breath sounds bilaterally, no wheezing, rales,rhonchi or crepitation. No use of accessory muscles of respiration.  CARDIOVASCULAR: S1, S2 normal. No murmurs, rubs, or gallops. Has tenderness on the chest wall on palpation. In Mid sternal area.  ABDOMEN: Soft, nontender, nondistended. Bowel sounds present. No organomegaly or mass.  EXTREMITIES: No pedal edema, cyanosis, or clubbing.  NEUROLOGIC: Cranial nerves II through XII are intact. Muscle strength 5/5 in all extremities. Sensation intact. Gait not checked.  PSYCHIATRIC: The patient is alert and oriented x 3.  SKIN: No obvious rash, lesion, or ulcer.   LABORATORY PANEL:   CBC  Recent Labs Lab 06/12/15 0816  WBC 8.0  HGB 13.4  HCT 38.8*  PLT 203   ------------------------------------------------------------------------------------------------------------------  Chemistries   Recent Labs Lab 06/12/15 0816  NA 135  K 3.9  CL 100*  CO2 27  GLUCOSE 191*  BUN 15  CREATININE 1.03  CALCIUM 9.0  AST 61*  ALT 90*  ALKPHOS 68  BILITOT 0.4   ------------------------------------------------------------------------------------------------------------------  Cardiac Enzymes  Recent Labs Lab 06/12/15 1130  TROPONINI <0.03   ------------------------------------------------------------------------------------------------------------------  RADIOLOGY:  Dg Chest Port 1 View  06/12/2015  CLINICAL DATA:  Intermittently chest pain midsternal region starting Thursday, cough EXAM: PORTABLE CHEST 1 VIEW COMPARISON:  12/30/2014 FINDINGS: Cardiomediastinal silhouette is stable. No acute infiltrate or pleural effusion. No pulmonary edema. Bony thorax is unremarkable. IMPRESSION: No active disease. Electronically Signed   By: Lahoma Crocker M.D.   On: 06/12/2015 08:10    EKG:   Orders placed or performed during the hospital encounter of 06/12/15  . ED EKG  . ED EKG  . EKG  12-Lead  . EKG 12-Lead  . EKG 12-Lead  . EKG 12-Lead   Normal sinus rhythm 80 bpm no ST-T changes.  IMPRESSION AND PLAN:  50 year old the obese male with pleuritic chest pain and cough due to acute bronchitis rather than angina. Patient has been having a lot of dry cough and chest  pain when he coughs. Patient troponins are negative for 2 times.EKG did not show any ST_T changes. Patient had a cardiac catheterization last year, I spoke with [place]  cardiologist on-call ,he recommended no further work up and he felt  patient can be discharged home . Patient can have a stress test as an outpatient and his office will call him. #2 acute bronchitis with cough, pleuritic chest pain: Wife requested antibiotics, patient can get her have azithromycin. Work note  is requested by patient. As he  Works  overnight from 5 PM to 5 AM ,We will give work note for 2 days. #3 morbid obesity with diabetes mellitus type 2, essential hypertension, anxiety: Continue his home medications.   Patient  can be discharged home. Discussed this with  patient's wife, patient they both are in agreement.  All the records are reviewed and case discussed with ED provider. Management plans discussed with the patient, family and they are in agreement.  CODE STATUS: full  TOTAL TIME TAKING CARE OF THIS PATIENT: 36minutes.    Epifanio Lesches M.D on 06/12/2015 at 1:36 PM  Between 7am to 6pm - Pager - [phone]  After 6pm go to [email or web address] - password [email or web address] [facility]  Office  [phone]  CC: Primary care physician; Ronnell Freshwater, NP  Note: This dictation was prepared with Dragon dictation along with smaller phrase technology. Any transcriptional errors that result from this process are unintentional.

## 2015-06-12 NOTE — ED Notes (Signed)
Pt to ed with c/o chest pain intermittently since Thursday.  Pt reports chest pain associated with cough, congestion and sob.  Pt reports it feels like he did when he had pneumonia.  Pt alert and oriented at this time. Skin warm and dry, dry cough noted.  Pt placed on monitor and ekg done.  Dr Jimmye Norman at bedside.

## 2015-06-12 NOTE — ED Provider Notes (Addendum)
[facility] Emergency Department Provider Note        Time seen: ----------------------------------------- 7:53 AM on 06/12/2015 -----------------------------------------    I have reviewed the triage vital signs and the nursing notes.   HISTORY  Chief Complaint No chief complaint on file.    HPI Gerald [name] is a 50 y.o. male who presents to ER for chest tightnessand cough. Patient states it feels like when he had pneumonia previously. She states it also feels similar to when he had a heart attack. Symptoms been going on for several days, nothing makes it better. He also has pain in his neck with a cough.   Past Medical History  Diagnosis Date  . Myocardial infarction ([place]) 09/2011  . Hypertension   . Hyperlipidemia   . Obesity   . Diabetes mellitus without complication (HCC)     borderline  . Nonischemic cardiomyopathy (HCC)     Previous ejection fraction of 35-40% on echo. 50% on cardiac catheterization in August of 2013  . Sleep apnea   . Coronary artery disease     Mild nonobstructive coronary artery disease on previous cardiac catheterization most recently in August of 2013  . Gastritis   . Hepatic steatosis   . Abnormal LFTs   . GERD (gastroesophageal reflux disease)   . Fundic gland polyps of stomach, benign     Patient Active Problem List   Diagnosis Date Noted  . Other chest pain 05/22/2014  . Mass of jaw 05/22/2014  . Right flank pain 11/23/2013  . Dysuria 11/23/2013  . Encounter for wellness examination 11/08/2013  . Retaining fluid 11/08/2013  . Urinary incontinence 03/14/2013  . Depression 01/13/2013  . Type 2 diabetes mellitus ([place]) 09/14/2012  . Nonischemic cardiomyopathy ([place])   . HLD (hyperlipidemia) 05/28/2012  . CAD (coronary artery disease) 03/30/2012  . Chronic pain syndrome 03/30/2012  . HTN (hypertension) 03/30/2012    Past Surgical History  Procedure Laterality Date  . Colonoscopy    . Cardiac  catheterization N/A 09/2011    [facility]; EF 50% with 30% mid LAD stenosis and no obstructive disease.  . Cardiac catheterization  09/2010    [facility]; Mid LAD 40% stenosis; Mid Circumflex:Normal; Mid RCA; Normal  . Knee arthroscopy with medial menisectomy Right 09/16/2012    Procedure: RIGHT KNEE ARTHROSCOPY WITH MEDIAL AND LATERAL MENISECTOMY, CHONDROPLASTY;  Surgeon: Ninetta Lights, MD;  Location: [place];  Service: Orthopedics;  Laterality: Right;  RIGHT KNEE SCOPE MEDIAL MENISCECTOMY  . Cardiac catheterization      Allergies Statins  Social History Social History  Substance Use Topics  . Smoking status: Never Smoker   . Smokeless tobacco: Never Used  . Alcohol Use: No     Comment: rare    Review of Systems Constitutional: Negative for fever. Eyes: Negative for visual changes. ENT: Negative for sore throat. Cardiovascular: Positive for chest tightness Respiratory: Positive for shortness of breath and cough Gastrointestinal: Negative for abdominal pain, vomiting and diarrhea. Genitourinary: Negative for dysuria. Musculoskeletal: Negative for back pain. Positive for neck pain Skin: Negative for rash. Neurological: Negative for headaches, focal weakness or numbness.  10-point ROS otherwise negative.  ____________________________________________   PHYSICAL EXAM:  VITAL SIGNS: ED Triage Vitals  Enc Vitals Group     BP --      Pulse --      Resp --      Temp --      Temp src --      SpO2 --  Weight --      Height --      Head Cir --      Peak Flow --      Pain Score --      Pain Loc --      Pain Edu? --      Excl. in [place]? --     Constitutional: Alert and oriented. Well appearing and in no distress. Eyes: Conjunctivae are normal. PERRL. Normal extraocular movements. ENT   Head: Normocephalic and atraumatic.   Nose: No congestion/rhinnorhea.   Mouth/Throat: Mucous membranes are moist.   Neck: No stridor. Cardiovascular: Normal rate,  regular rhythm. No murmurs, rubs, or gallops. Respiratory: Normal respiratory effort without tachypnea nor retractions. Breath sounds are clear and equal bilaterally. No wheezes/rales/rhonchi. Gastrointestinal: Soft and nontender. Normal bowel sounds Musculoskeletal: Nontender with normal range of motion in all extremities. No lower extremity tenderness nor edema. Neurologic:  Normal speech and language. No gross focal neurologic deficits are appreciated.  Skin:  Skin is warm, dry and intact. No rash noted. Psychiatric: Mood and affect are normal. Speech and behavior are normal.  ____________________________________________  EKG: Interpreted by me. Sinus rhythm with a rate of 80 bpm, septal infarct, normal axis, normal QT  ____________________________________________  ED COURSE:  Pertinent labs & imaging results that were available during my care of the patient were reviewed by me and considered in my medical decision making (see chart for details). Patient is in no acute distress, will check basic labs and imaging and reevaluate. ____________________________________________    LABS (pertinent positives/negatives)  Labs Reviewed  CBC - Abnormal; Notable for the following:    RBC 4.38 (*)    HCT 38.8 (*)    All other components within normal limits  COMPREHENSIVE METABOLIC PANEL - Abnormal; Notable for the following:    Chloride 100 (*)    Glucose, Bld 191 (*)    AST 61 (*)    ALT 90 (*)    All other components within normal limits  TROPONIN I    RADIOLOGY Images were viewed by me  Chest x-ray  IMPRESSION: No active disease. ____________________________________________  FINAL ASSESSMENT AND PLAN  Chest pain  Plan: Patient with labs and imaging as dictated above. Patient resents with chest pain like prior MI. Chest pain was relieved with aspirin and nitroglycerin. I will discuss with the hospitalist for cardiac rule out and possible stress testing.   [name], MD  Patient's been evaluated by the hospitalist and was felt not to need admission after discussion with cardiologist. His catheter from last year was reviewed. Repeat troponin was negative. This was thought to be more respiratory related, he'll be given a Z-Pak and cough medication. He is stable for outpatient follow-up. Patient and family agree with plan. Note: This dictation was prepared with Dragon dictation. Any transcriptional errors that result from this process are unintentional   [name], MD 06/12/15 [place], MD 06/12/15 1400

## 2015-06-12 NOTE — Telephone Encounter (Signed)
-----   Message from Minna Merritts, MD sent at 06/12/2015  1:28 PM EDT ----- May need outpt appt, or outpt stress test

## 2015-06-12 NOTE — Discharge Instructions (Signed)
Nonspecific Chest Pain  Chest pain can be caused by many different conditions. There is always a chance that your pain could be related to something serious, such as a heart attack or a blood clot in your lungs. Chest pain can also be caused by conditions that are not life-threatening. If you have chest pain, it is very important to follow up with your health care provider. CAUSES  Chest pain can be caused by:  Heartburn.  Pneumonia or bronchitis.  Anxiety or stress.  Inflammation around your heart (pericarditis) or lung (pleuritis or pleurisy).  A blood clot in your lung.  A collapsed lung (pneumothorax). It can develop suddenly on its own (spontaneous pneumothorax) or from trauma to the chest.  Shingles infection (varicella-zoster virus).  Heart attack.  Damage to the bones, muscles, and cartilage that make up your chest wall. This can include:  Bruised bones due to injury.  Strained muscles or cartilage due to frequent or repeated coughing or overwork.  Fracture to one or more ribs.  Sore cartilage due to inflammation (costochondritis). RISK FACTORS  Risk factors for chest pain may include:  Activities that increase your risk for trauma or injury to your chest.  Respiratory infections or conditions that cause frequent coughing.  Medical conditions or overeating that can cause heartburn.  Heart disease or family history of heart disease.  Conditions or health behaviors that increase your risk of developing a blood clot.  Having had chicken pox (varicella zoster). SIGNS AND SYMPTOMS Chest pain can feel like:  Burning or tingling on the surface of your chest or deep in your chest.  Crushing, pressure, aching, or squeezing pain.  Dull or sharp pain that is worse when you move, cough, or take a deep breath.  Pain that is also felt in your back, neck, shoulder, or arm, or pain that spreads to any of these areas. Your chest pain may come and go, or it may stay  constant. DIAGNOSIS Lab tests or other studies may be needed to find the cause of your pain. Your health care provider may have you take a test called an ambulatory ECG (electrocardiogram). An ECG records your heartbeat patterns at the time the test is performed. You may also have other tests, such as:  Transthoracic echocardiogram (TTE). During echocardiography, sound waves are used to create a picture of all of the heart structures and to look at how blood flows through your heart.  Transesophageal echocardiogram (TEE).This is a more advanced imaging test that obtains images from inside your body. It allows your health care provider to see your heart in finer detail.  Cardiac monitoring. This allows your health care provider to monitor your heart rate and rhythm in real time.  Holter monitor. This is a portable device that records your heartbeat and can help to diagnose abnormal heartbeats. It allows your health care provider to track your heart activity for several days, if needed.  Stress tests. These can be done through exercise or by taking medicine that makes your heart beat more quickly.  Blood tests.  Imaging tests. TREATMENT  Your treatment depends on what is causing your chest pain. Treatment may include:  Medicines. These may include:  Acid blockers for heartburn.  Anti-inflammatory medicine.  Pain medicine for inflammatory conditions.  Antibiotic medicine, if an infection is present.  Medicines to dissolve blood clots.  Medicines to treat coronary artery disease.  Supportive care for conditions that do not require medicines. This may include:  Resting.  Applying heat  or cold packs to injured areas.  Limiting activities until pain decreases. HOME CARE INSTRUCTIONS  If you were prescribed an antibiotic medicine, finish it all even if you start to feel better.  Avoid any activities that bring on chest pain.  Do not use any tobacco products, including  cigarettes, chewing tobacco, or electronic cigarettes. If you need help quitting, ask your health care provider.  Do not drink alcohol.  Take medicines only as directed by your health care provider.  Keep all follow-up visits as directed by your health care provider. This is important. This includes any further testing if your chest pain does not go away.  If heartburn is the cause for your chest pain, you may be told to keep your head raised (elevated) while sleeping. This reduces the chance that acid will go from your stomach into your esophagus.  Make lifestyle changes as directed by your health care provider. These may include:  Getting regular exercise. Ask your health care provider to suggest some activities that are safe for you.  Eating a heart-healthy diet. A registered dietitian can help you to learn healthy eating options.  Maintaining a healthy weight.  Managing diabetes, if necessary.  Reducing stress. SEEK MEDICAL CARE IF:  Your chest pain does not go away after treatment.  You have a rash with blisters on your chest.  You have a fever. SEEK IMMEDIATE MEDICAL CARE IF:   Your chest pain is worse.  You have an increasing cough, or you cough up blood.  You have severe abdominal pain.  You have severe weakness.  You faint.  You have chills.  You have sudden, unexplained chest discomfort.  You have sudden, unexplained discomfort in your arms, back, neck, or jaw.  You have shortness of breath at any time.  You suddenly start to sweat, or your skin gets clammy.  You feel nauseous or you vomit.  You suddenly feel light-headed or dizzy.  Your heart begins to beat quickly, or it feels like it is skipping beats. These symptoms may represent a serious problem that is an emergency. Do not wait to see if the symptoms will go away. Get medical help right away. Call your local emergency services (911 in the [place] Do not drive yourself to the hospital.   This  information is not intended to replace advice given to you by your health care provider. Make sure you discuss any questions you have with your health care provider.   Document Released: 10/30/2004 Document Revised: 02/10/2014 Document Reviewed: 08/26/2013 Elsevier Interactive Patient Education 2016 Else[employer]. Acute Bronchitis Bronchitis is inflammation of the airways that extend from the windpipe into the lungs (bronchi). The inflammation often causes mucus to develop. This leads to a cough, which is the most common symptom of bronchitis.  In acute bronchitis, the condition usually develops suddenly and goes away over time, usually in a couple weeks. Smoking, allergies, and asthma can make bronchitis worse. Repeated episodes of bronchitis may cause further lung problems.  CAUSES Acute bronchitis is most often caused by the same virus that causes a cold. The virus can spread from person to person (contagious) through coughing, sneezing, and touching contaminated objects. SIGNS AND SYMPTOMS   Cough.   Fever.   Coughing up mucus.   Body aches.   Chest congestion.   Chills.   Shortness of breath.   Sore throat.  DIAGNOSIS  Acute bronchitis is usually diagnosed through a physical exam. Your health care provider will also ask you questions  about your medical history. Tests, such as chest X-rays, are sometimes done to rule out other conditions.  TREATMENT  Acute bronchitis usually goes away in a couple weeks. Oftentimes, no medical treatment is necessary. Medicines are sometimes given for relief of fever or cough. Antibiotic medicines are usually not needed but may be prescribed in certain situations. In some cases, an inhaler may be recommended to help reduce shortness of breath and control the cough. A cool mist vaporizer may also be used to help thin bronchial secretions and make it easier to clear the chest.  HOME CARE INSTRUCTIONS  Get plenty of rest.   Drink enough  fluids to keep your urine clear or pale yellow (unless you have a medical condition that requires fluid restriction). Increasing fluids may help thin your respiratory secretions (sputum) and reduce chest congestion, and it will prevent dehydration.   Take medicines only as directed by your health care provider.  If you were prescribed an antibiotic medicine, finish it all even if you start to feel better.  Avoid smoking and secondhand smoke. Exposure to cigarette smoke or irritating chemicals will make bronchitis worse. If you are a smoker, consider using nicotine gum or skin patches to help control withdrawal symptoms. Quitting smoking will help your lungs heal faster.   Reduce the chances of another bout of acute bronchitis by washing your hands frequently, avoiding people with cold symptoms, and trying not to touch your hands to your mouth, nose, or eyes.   Keep all follow-up visits as directed by your health care provider.  SEEK MEDICAL CARE IF: Your symptoms do not improve after 1 week of treatment.  SEEK IMMEDIATE MEDICAL CARE IF:  You develop an increased fever or chills.   You have chest pain.   You have severe shortness of breath.  You have bloody sputum.   You develop dehydration.  You faint or repeatedly feel like you are going to pass out.  You develop repeated vomiting.  You develop a severe headache. MAKE SURE YOU:   Understand these instructions.  Will watch your condition.  Will get help right away if you are not doing well or get worse.   This information is not intended to replace advice given to you by your health care provider. Make sure you discuss any questions you have with your health care provider.   Document Released: 02/28/2004 Document Revised: 02/10/2014 Document Reviewed: 07/13/2012 Elsevier Interactive Patient Education [employer].

## 2015-06-13 ENCOUNTER — Telehealth: Payer: Self-pay | Admitting: Cardiovascular Disease

## 2015-06-13 NOTE — Telephone Encounter (Signed)
Pt called, states that he has bronchitis, and is not sure if he can do his stress test on 5/12. Please call to discuss

## 2015-06-13 NOTE — Telephone Encounter (Signed)
Spoke w/ pt.  He asks to call back to reschedule ETT Myoview for when he is feeling better.

## 2015-06-15 ENCOUNTER — Ambulatory Visit: Payer: 59

## 2015-08-05 ENCOUNTER — Other Ambulatory Visit: Payer: Self-pay | Admitting: Internal Medicine

## 2015-10-17 ENCOUNTER — Encounter: Payer: Self-pay | Admitting: Emergency Medicine

## 2015-10-17 ENCOUNTER — Emergency Department: Payer: 59

## 2015-10-17 ENCOUNTER — Emergency Department
Admission: EM | Admit: 2015-10-17 | Discharge: 2015-10-18 | Disposition: A | Discharge status: Home / Self Care | Payer: 59 | Attending: Emergency Medicine | Admitting: Emergency Medicine

## 2015-10-17 DIAGNOSIS — I2510 Atherosclerotic heart disease of native coronary artery without angina pectoris: Secondary | ICD-10-CM | POA: Insufficient documentation

## 2015-10-17 DIAGNOSIS — I252 Old myocardial infarction: Secondary | ICD-10-CM | POA: Insufficient documentation

## 2015-10-17 DIAGNOSIS — I10 Essential (primary) hypertension: Secondary | ICD-10-CM | POA: Insufficient documentation

## 2015-10-17 DIAGNOSIS — Z792 Long term (current) use of antibiotics: Secondary | ICD-10-CM | POA: Insufficient documentation

## 2015-10-17 DIAGNOSIS — Z79899 Other long term (current) drug therapy: Secondary | ICD-10-CM | POA: Insufficient documentation

## 2015-10-17 DIAGNOSIS — E119 Type 2 diabetes mellitus without complications: Secondary | ICD-10-CM | POA: Insufficient documentation

## 2015-10-17 DIAGNOSIS — Z7982 Long term (current) use of aspirin: Secondary | ICD-10-CM | POA: Diagnosis not present

## 2015-10-17 DIAGNOSIS — R5383 Other fatigue: Secondary | ICD-10-CM | POA: Diagnosis not present

## 2015-10-17 DIAGNOSIS — R51 Headache: Secondary | ICD-10-CM | POA: Diagnosis not present

## 2015-10-17 DIAGNOSIS — R519 Headache, unspecified: Secondary | ICD-10-CM

## 2015-10-17 LAB — CBC
HCT: 42.4 % (ref 40.0–52.0)
Hemoglobin: 14.7 g/dL (ref 13.0–18.0)
MCH: 29.8 pg (ref 26.0–34.0)
MCHC: 34.8 g/dL (ref 32.0–36.0)
MCV: 85.8 fL (ref 80.0–100.0)
PLATELETS: 223 10*3/uL (ref 150–440)
RBC: 4.94 MIL/uL (ref 4.40–5.90)
RDW: 14.2 % (ref 11.5–14.5)
WBC: 9.3 10*3/uL (ref 3.8–10.6)

## 2015-10-17 LAB — URINE DRUG SCREEN, QUALITATIVE (ARMC ONLY)
Amphetamines, Ur Screen: NOT DETECTED
BARBITURATES, UR SCREEN: NOT DETECTED
BENZODIAZEPINE, UR SCRN: NOT DETECTED
CANNABINOID 50 NG, UR ~~LOC~~: NOT DETECTED
Cocaine Metabolite,Ur ~~LOC~~: NOT DETECTED
MDMA (Ecstasy)Ur Screen: NOT DETECTED
Methadone Scn, Ur: NOT DETECTED
Opiate, Ur Screen: NOT DETECTED
PHENCYCLIDINE (PCP) UR S: NOT DETECTED
TRICYCLIC, UR SCREEN: NOT DETECTED

## 2015-10-17 LAB — BASIC METABOLIC PANEL
Anion gap: 7 (ref 5–15)
BUN: 17 mg/dL (ref 6–20)
CALCIUM: 9.3 mg/dL (ref 8.9–10.3)
CO2: 27 mmol/L (ref 22–32)
CREATININE: 1.05 mg/dL (ref 0.61–1.24)
Chloride: 100 mmol/L — ABNORMAL LOW (ref 101–111)
GFR calc Af Amer: >60 mL/min (ref >60)
GLUCOSE: 164 mg/dL — AB (ref 65–99)
Potassium: 3.7 mmol/L (ref 3.5–5.1)
Sodium: 134 mmol/L — ABNORMAL LOW (ref 135–145)

## 2015-10-17 LAB — URINALYSIS COMPLETE WITH MICROSCOPIC (ARMC ONLY)
Bacteria, UA: NONE SEEN
Bilirubin Urine: NEGATIVE
GLUCOSE, UA: NEGATIVE mg/dL
Hgb urine dipstick: NEGATIVE
KETONES UR: NEGATIVE mg/dL
Leukocytes, UA: NEGATIVE
Nitrite: NEGATIVE
PROTEIN: NEGATIVE mg/dL
Specific Gravity, Urine: 1.013 (ref 1.005–1.030)
pH: 6 (ref 5.0–8.0)

## 2015-10-17 LAB — TROPONIN I

## 2015-10-17 NOTE — ED Triage Notes (Addendum)
Pt present to ED with spouse, who reports pt has been lethargic today and c/o chest pain, headache, nausea and weakness. Pt states saw PCP for routine physical, states was told right leg was swollen and discolored and is scheduled for an [place] 9/20. Pt appears drowsy during triage, reports has not taken any medications to make him drowsy. Pt denies photosensitive.

## 2015-10-18 ENCOUNTER — Emergency Department: Payer: 59

## 2015-10-18 LAB — BLOOD GAS, ARTERIAL
Acid-Base Excess: 5 mmol/L — ABNORMAL HIGH (ref 0.0–2.0)
BICARBONATE: 29.1 mmol/L — AB (ref 20.0–28.0)
FIO2: 0.21
O2 Saturation: 95.3 %
PH ART: 7.47 — AB (ref 7.350–7.450)
PO2 ART: 72 mmHg — AB (ref 83.0–108.0)
Patient temperature: 37
pCO2 arterial: 40 mmHg (ref 32.0–48.0)

## 2015-10-18 LAB — CARBOXYHEMOGLOBIN
Carboxyhemoglobin: 2.2 % — ABNORMAL HIGH (ref 0.5–1.5)
Methemoglobin: 1.2 % (ref 0.0–1.5)
O2 Saturation: 95 %
TOTAL OXYGEN CONTENT: 92.6 mL/dL

## 2015-10-18 LAB — AMMONIA: AMMONIA: 20 umol/L (ref 9–35)

## 2015-10-18 MED ORDER — SODIUM CHLORIDE 0.9 % IV BOLUS (SEPSIS)
1000.0000 mL | Freq: Once | INTRAVENOUS | Status: AC
  Administered 2015-10-18: 1000 mL via INTRAVENOUS

## 2015-10-18 MED ORDER — PROCHLORPERAZINE EDISYLATE 5 MG/ML IJ SOLN
5.0000 mg | Freq: Once | INTRAMUSCULAR | Status: AC
  Administered 2015-10-18: 5 mg via INTRAVENOUS
  Filled 2015-10-18: qty 2

## 2015-10-18 MED ORDER — DIPHENHYDRAMINE HCL 50 MG/ML IJ SOLN
12.5000 mg | Freq: Once | INTRAMUSCULAR | Status: AC
  Administered 2015-10-18: 12.5 mg via INTRAVENOUS
  Filled 2015-10-18: qty 1

## 2015-10-18 MED ORDER — PROCHLORPERAZINE MALEATE 10 MG PO TABS: 10.0000 mg | ORAL_TABLET | Freq: Four times a day (QID) | ORAL | 0 refills | Status: DC | PRN

## 2015-10-18 NOTE — Discharge Instructions (Signed)
1. You may take Compazine as needed for headache. 2. Rest and drink plenty of fluids this weekend. 3. Return to the ER for worsening symptoms, persistent vomiting, difficulty breathing or other concerns.

## 2015-10-18 NOTE — ED Provider Notes (Signed)
[facility] Emergency Department Provider Note   ____________________________________________   First MD Initiated Contact with Patient 10/18/15 0025     (approximate)  I have reviewed the triage vital signs and the nursing notes.   HISTORY  Chief Complaint Headache    HPI Gerald [name] is a 50 y.o. male who presents to the ED from home with a chief complaint of headache. Patient works third shift and reports having headache when he got off of work at 5 AM. Works in a warehouse and sometimes smells truck fumes. Complains of gradual onset global headache which starts posteriorly and works as way forward. Symptoms associated with nausea only. Also reports generalized weakness. Self reports lethargy. States yesterday he had an appointment with his PCP who is scheduled him for a outpatient ultrasound of his right leg. He was told that his right leg was swollen discolored. States today his leg is not swollen or discolored. Denies fever, chills, chest pain, shortness of breath, photophobia, abdominal pain, dysuria, diarrhea. Denies recent travel trauma. Nothing makes his symptoms better or worse. Patient has not taking any medications for his symptoms.   Past Medical History:  Diagnosis Date  . Abnormal LFTs   . Coronary artery disease    Mild nonobstructive coronary artery disease on previous cardiac catheterization most recently in August of 2013  . Diabetes mellitus without complication (HCC)    borderline  . Fundic gland polyps of stomach, benign   . Gastritis   . GERD (gastroesophageal reflux disease)   . Hepatic steatosis   . Hyperlipidemia   . Hypertension   . Myocardial infarction ([place]) 09/2011  . Nonischemic cardiomyopathy (HCC)    Previous ejection fraction of 35-40% on echo. 50% on cardiac catheterization in August of 2013  . Obesity   . Sleep apnea     Patient Active Problem List   Diagnosis Date Noted  . Other chest pain 05/22/2014  .  Mass of jaw 05/22/2014  . Right flank pain 11/23/2013  . Dysuria 11/23/2013  . Encounter for wellness examination 11/08/2013  . Retaining fluid 11/08/2013  . Urinary incontinence 03/14/2013  . Depression 01/13/2013  . Type 2 diabetes mellitus ([place]) 09/14/2012  . Nonischemic cardiomyopathy ([place])   . HLD (hyperlipidemia) 05/28/2012  . CAD (coronary artery disease) 03/30/2012  . Chronic pain syndrome 03/30/2012  . HTN (hypertension) 03/30/2012    Past Surgical History:  Procedure Laterality Date  . CARDIAC CATHETERIZATION N/A 09/2011   [facility]; EF 50% with 30% mid LAD stenosis and no obstructive disease.  Marland Kitchen CARDIAC CATHETERIZATION  09/2010   [facility]; Mid LAD 40% stenosis; Mid Circumflex:Normal; Mid RCA; Normal  . CARDIAC CATHETERIZATION    . COLONOSCOPY    . KNEE ARTHROSCOPY WITH MEDIAL MENISECTOMY Right 09/16/2012   Procedure: RIGHT KNEE ARTHROSCOPY WITH MEDIAL AND LATERAL MENISECTOMY, CHONDROPLASTY;  Surgeon: Ninetta Lights, MD;  Location: [place];  Service: Orthopedics;  Laterality: Right;  RIGHT KNEE SCOPE MEDIAL MENISCECTOMY    Prior to Admission medications   Medication Sig Start Date End Date Taking? Authorizing Provider  albuterol (PROVENTIL HFA) 108 (90 BASE) MCG/ACT inhaler Inhale 2 puffs into the lungs every 4 (four) hours as needed for wheezing or shortness of breath. 12/30/14  Yes Carrie Mew, MD  aspirin EC 81 MG tablet Take 81 mg by mouth daily.   Yes Historical Provider, MD  Cholecalciferol (VITAMIN D3) 5000 UNITS CAPS Take 5,000 Units by mouth daily.   Yes Historical Provider, MD  citalopram (CELEXA) 20 MG tablet Take 1 tablet (20 mg total) by mouth daily. 01/31/14  Yes Jearld Fenton, NP  Dulaglutide (TRULICITY) 1.5 0000000 SOPN Inject 1.5 mg into the skin once a week.   Yes Historical Provider, MD  furosemide (LASIX) 20 MG tablet Take 1 tablet (20 mg total) by mouth daily. 11/08/13  Yes Lucille Passy, MD  glipiZIDE (GLUCOTROL) 5 MG tablet Take 5 mg by  mouth daily before breakfast.   Yes Historical Provider, MD  lisinopril-hydrochlorothiazide (PRINZIDE,ZESTORETIC) 10-12.5 MG tablet Take 1 tablet by mouth daily. 05/31/15  Yes Historical Provider, MD  metoprolol succinate (TOPROL-XL) 25 MG 24 hr tablet Take 1 tablet (25 mg total) by mouth daily. 11/08/13  Yes Lucille Passy, MD  nitroGLYCERIN (NITROSTAT) 0.4 MG SL tablet Place 1 tablet (0.4 mg total) under the tongue every 5 (five) minutes as needed for chest pain. 11/08/13  Yes Lucille Passy, MD  omeprazole (PRILOSEC) 40 MG capsule TAKE 1 CAPSULE (40 MG TOTAL) BY MOUTH DAILY. 08/06/15  Yes Irene Shipper, MD  azithromycin (ZITHROMAX Z-PAK) 250 MG tablet Take 2 tablets (500 mg) on  Day 1,  followed by 1 tablet (250 mg) once daily on Days 2 through 5. Patient not taking: Reported on 10/18/2015 06/12/15   [name], MD  chlorpheniramine-HYDROcodone [facility] ER) 10-8 MG/5ML SUER Take 5 mLs by mouth 2 (two) times daily. Patient not taking: Reported on 10/18/2015 06/12/15   [name], MD  lisinopril (PRINIVIL,ZESTRIL) 5 MG tablet Take 1 tablet (5 mg total) by mouth daily. Patient not taking: Reported on 10/18/2015 01/09/15 01/09/16  Harvest Dark, MD  potassium chloride SA (K-DUR,KLOR-CON) 20 MEQ tablet Take 1 tablet (20 mEq total) by mouth daily. Patient not taking: Reported on 10/18/2015 11/08/13   Lucille Passy, MD  prochlorperazine (COMPAZINE) 10 MG tablet Take 1 tablet (10 mg total) by mouth every 6 (six) hours as needed for nausea. 10/18/15   Paulette Blanch, MD    Allergies Statins  Family History  Problem Relation Age of Onset  . Heart disease Father 12    MI  . Heart attack Father   . Stomach cancer Father   . Hypertension Mother   . Breast cancer Mother     Social History Social History  Substance Use Topics  . Smoking status: Never Smoker  . Smokeless tobacco: Never Used  . Alcohol use No     Comment: rare    Review of Systems  Constitutional: No  fever/chills. Eyes: No visual changes. ENT: No sore throat. Cardiovascular: Denies chest pain. Respiratory: Denies shortness of breath. Gastrointestinal: No abdominal pain.  Positive for nausea, no vomiting.  No diarrhea.  No constipation. Genitourinary: Negative for dysuria. Musculoskeletal: Positive for RLE swelling and discoloration. Negative for back pain. Skin: Negative for rash. Neurological: Positive for headache. Negative for focal weakness or numbness.  10-point ROS otherwise negative.  ____________________________________________   PHYSICAL EXAM:  VITAL SIGNS: ED Triage Vitals  Enc Vitals Group     BP 10/17/15 2149 (!) 141/84     Pulse Rate 10/17/15 2149 91     Resp 10/17/15 2149 18     Temp 10/17/15 2149 98.1 F (36.7 C)     Temp Source 10/17/15 2149 Oral     SpO2 10/17/15 2149 96 %     Weight 10/17/15 2150 (!) 339 lb (153.8 kg)     Height 10/17/15 2150 6\' 3"  (1.905 m)     Head Circumference --  Peak Flow --      Pain Score 10/17/15 2150 9     Pain Loc --      Pain Edu? --      Excl. in [place]? --     Constitutional: Alert and oriented. Well appearing and in no acute distress. Intentionally keeps eyes closed for exam. Opens on command. Eyes: Conjunctivae are normal. PERRL. EOMI. Head: Atraumatic. Nose: No congestion/rhinnorhea. Mouth/Throat: Mucous membranes are moist.  Oropharynx non-erythematous. Neck: No stridor.  Supple neck without meningismus. No carotid bruits. Cardiovascular: Normal rate, regular rhythm. Grossly normal heart sounds.  Good peripheral circulation. Respiratory: Normal respiratory effort.  No retractions. Lungs CTAB. Gastrointestinal: Obese. Soft and nontender. No distention. No abdominal bruits. No CVA tenderness. Musculoskeletal: No lower extremity tenderness nor edema.  BLE symmetrical without discoloration. No joint effusions. Neurologic:  Alert and oriented 4. Normal speech and language. CN II-XII mostly intact. No gross focal  neurologic deficits are appreciated.  Skin:  Skin is warm, dry and intact. No rash noted. No petechiae. Psychiatric: Mood and affect are normal. Speech and behavior are normal.  ____________________________________________   LABS (all labs ordered are listed, but only abnormal results are displayed)  Labs Reviewed  BASIC METABOLIC PANEL - Abnormal; Notable for the following:       Result Value   Sodium 134 (*)    Chloride 100 (*)    Glucose, Bld 164 (*)    All other components within normal limits  URINALYSIS COMPLETEWITH MICROSCOPIC ([facility] ONLY) - Abnormal; Notable for the following:    Color, Urine YELLOW (*)    APPearance CLEAR (*)    Squamous Epithelial / LPF 0-5 (*)    All other components within normal limits  BLOOD GAS, ARTERIAL - Abnormal; Notable for the following:    pH, Arterial 7.47 (*)    pO2, Arterial 72 (*)    Bicarbonate 29.1 (*)    Acid-Base Excess 5.0 (*)    All other components within normal limits  CARBOXYHEMOGLOBIN - Abnormal; Notable for the following:    Carboxyhemoglobin 2.2 (*)    All other components within normal limits  CBC  TROPONIN I  URINE DRUG SCREEN, QUALITATIVE ([facility] ONLY)  AMMONIA   ____________________________________________  EKG  ED ECG REPORT I, Neyra Pettie J, the attending physician, personally viewed and interpreted this ECG.   Date: 10/18/2015  EKG Time: 2159  Rate: 80  Rhythm: normal EKG, normal sinus rhythm  Axis: Normal  Intervals:none  ST&T Change: Nonspecific  ____________________________________________  RADIOLOGY  RLE Doppler interpreted per Dr. Marisue Humble: No evidence of right lower extremity deep venous thrombosis.  Chest 2 view (viewed by me, interpreted per Dr. Marisue Humble): No active cardiopulmonary disease.  CT head interpreted per Dr. Collins Scotland: Normal head CT. ____________________________________________   PROCEDURES  Procedure(s) performed: None  Procedures  Critical Care performed:  No  ____________________________________________   INITIAL IMPRESSION / ASSESSMENT AND PLAN / ED COURSE  Pertinent labs & imaging results that were available during my care of the patient were reviewed by me and considered in my medical decision making (see chart for details).  50 year old male who presents with multiple medical complaints including headache and RLE swelling and discoloration. No swelling or discoloration appreciated on exam. No focal neurological deficits. Will infuse IV fluids, IV analgesia with antiemetic, ABG with co-oximetry and reassess.  Clinical Course  Comment By Time  Updated patient and spouse of negative CT imaging and minimally elevated carboxyhemoglobin. Will place on nonrebreather oxygen for 30 minutes and reassess.  Paulette Blanch, MD 09/14 [phone]  Patient resting in no acute distress. No focal neurological deficits on reexamination. Updated patient and spouse of negative ammonia level. Strict return precautions given. Both verbalize understanding and agree with plan of care. Paulette Blanch, MD 09/14 0217     ____________________________________________   FINAL CLINICAL IMPRESSION(S) / ED DIAGNOSES  Final diagnoses:  Acute nonintractable headache, unspecified headache type  Other fatigue      NEW MEDICATIONS STARTED DURING THIS VISIT:  New Prescriptions   PROCHLORPERAZINE (COMPAZINE) 10 MG TABLET    Take 1 tablet (10 mg total) by mouth every 6 (six) hours as needed for nausea.     Note:  This document was prepared using Dragon voice recognition software and may include unintentional dictation errors.    Paulette Blanch, MD 10/18/15 [phone]

## 2015-10-22 ENCOUNTER — Other Ambulatory Visit: Payer: Self-pay | Admitting: Internal Medicine

## 2015-12-01 ENCOUNTER — Encounter: Payer: Self-pay | Admitting: Emergency Medicine

## 2015-12-01 ENCOUNTER — Emergency Department
Admission: EM | Admit: 2015-12-01 | Discharge: 2015-12-01 | Disposition: A | Discharge status: Home / Self Care | Payer: 59 | Attending: Emergency Medicine | Admitting: Emergency Medicine

## 2015-12-01 ENCOUNTER — Emergency Department: Payer: 59

## 2015-12-01 DIAGNOSIS — Z792 Long term (current) use of antibiotics: Secondary | ICD-10-CM | POA: Insufficient documentation

## 2015-12-01 DIAGNOSIS — I252 Old myocardial infarction: Secondary | ICD-10-CM | POA: Diagnosis not present

## 2015-12-01 DIAGNOSIS — I2510 Atherosclerotic heart disease of native coronary artery without angina pectoris: Secondary | ICD-10-CM | POA: Diagnosis not present

## 2015-12-01 DIAGNOSIS — Z7984 Long term (current) use of oral hypoglycemic drugs: Secondary | ICD-10-CM | POA: Diagnosis not present

## 2015-12-01 DIAGNOSIS — Z79899 Other long term (current) drug therapy: Secondary | ICD-10-CM | POA: Diagnosis not present

## 2015-12-01 DIAGNOSIS — E119 Type 2 diabetes mellitus without complications: Secondary | ICD-10-CM | POA: Insufficient documentation

## 2015-12-01 DIAGNOSIS — Z7982 Long term (current) use of aspirin: Secondary | ICD-10-CM | POA: Insufficient documentation

## 2015-12-01 DIAGNOSIS — R1030 Lower abdominal pain, unspecified: Secondary | ICD-10-CM | POA: Insufficient documentation

## 2015-12-01 DIAGNOSIS — I10 Essential (primary) hypertension: Secondary | ICD-10-CM | POA: Diagnosis not present

## 2015-12-01 DIAGNOSIS — R110 Nausea: Secondary | ICD-10-CM | POA: Diagnosis not present

## 2015-12-01 LAB — COMPREHENSIVE METABOLIC PANEL
ALBUMIN: 3.8 g/dL (ref 3.5–5.0)
ALK PHOS: 113 U/L (ref 38–126)
ALT: 74 U/L — ABNORMAL HIGH (ref 17–63)
ANION GAP: 8 (ref 5–15)
AST: 48 U/L — AB (ref 15–41)
BUN: 15 mg/dL (ref 6–20)
CO2: 24 mmol/L (ref 22–32)
Calcium: 8.7 mg/dL — ABNORMAL LOW (ref 8.9–10.3)
Chloride: 105 mmol/L (ref 101–111)
Creatinine, Ser: 0.96 mg/dL (ref 0.61–1.24)
GFR calc Af Amer: >60 mL/min (ref >60)
GFR calc non Af Amer: >60 mL/min (ref >60)
GLUCOSE: 217 mg/dL — AB (ref 65–99)
POTASSIUM: 3.7 mmol/L (ref 3.5–5.1)
SODIUM: 137 mmol/L (ref 135–145)
Total Bilirubin: 0.3 mg/dL (ref 0.3–1.2)
Total Protein: 7.4 g/dL (ref 6.5–8.1)

## 2015-12-01 LAB — URINALYSIS COMPLETE WITH MICROSCOPIC (ARMC ONLY)
BILIRUBIN URINE: NEGATIVE
Glucose, UA: NEGATIVE mg/dL
HGB URINE DIPSTICK: NEGATIVE
KETONES UR: NEGATIVE mg/dL
LEUKOCYTES UA: NEGATIVE
Nitrite: NEGATIVE
PH: 5 (ref 5.0–8.0)
PROTEIN: NEGATIVE mg/dL
Specific Gravity, Urine: 1.019 (ref 1.005–1.030)

## 2015-12-01 LAB — DIFFERENTIAL
BASOS ABS: 0 10*3/uL (ref 0–0.1)
BASOS PCT: 0 %
Eosinophils Absolute: 0.3 10*3/uL (ref 0–0.7)
Eosinophils Relative: 3 %
LYMPHS PCT: 25 %
Lymphs Abs: 2.6 10*3/uL (ref 1.0–3.6)
MONOS PCT: 7 %
Monocytes Absolute: 0.7 10*3/uL (ref 0.2–1.0)
NEUTROS ABS: 6.6 10*3/uL — AB (ref 1.4–6.5)
Neutrophils Relative %: 65 %

## 2015-12-01 LAB — TROPONIN I: Troponin I: <0.03 ng/mL (ref <0.03)

## 2015-12-01 LAB — POCT RAPID STREP A: Streptococcus, Group A Screen (Direct): NEGATIVE

## 2015-12-01 LAB — CBC
HEMATOCRIT: 42.5 % (ref 40.0–52.0)
HEMOGLOBIN: 14.4 g/dL (ref 13.0–18.0)
MCH: 29.3 pg (ref 26.0–34.0)
MCHC: 33.8 g/dL (ref 32.0–36.0)
MCV: 86.5 fL (ref 80.0–100.0)
Platelets: 236 10*3/uL (ref 150–440)
RBC: 4.92 MIL/uL (ref 4.40–5.90)
RDW: 14 % (ref 11.5–14.5)
WBC: 10.4 10*3/uL (ref 3.8–10.6)

## 2015-12-01 LAB — LIPASE, BLOOD: LIPASE: 18 U/L (ref 11–51)

## 2015-12-01 LAB — LACTIC ACID, PLASMA: Lactic Acid, Venous: 1.7 mmol/L (ref 0.5–1.9)

## 2015-12-01 MED ORDER — ONDANSETRON HCL 4 MG/2ML IJ SOLN
4.0000 mg | Freq: Once | INTRAMUSCULAR | Status: AC
  Administered 2015-12-01: 4 mg via INTRAVENOUS

## 2015-12-01 MED ORDER — SODIUM CHLORIDE 0.9 % IV BOLUS (SEPSIS)
1000.0000 mL | Freq: Once | INTRAVENOUS | Status: AC
  Administered 2015-12-01: 1000 mL via INTRAVENOUS

## 2015-12-01 MED ORDER — IOPAMIDOL (ISOVUE-300) INJECTION 61%
30.0000 mL | Freq: Once | INTRAVENOUS | Status: AC | PRN
  Administered 2015-12-01: 30 mL via ORAL

## 2015-12-01 MED ORDER — METOCLOPRAMIDE HCL 5 MG/ML IJ SOLN
10.0000 mg | Freq: Once | INTRAMUSCULAR | Status: AC
  Administered 2015-12-01: 10 mg via INTRAVENOUS
  Filled 2015-12-01: qty 2

## 2015-12-01 MED ORDER — IOPAMIDOL (ISOVUE-300) INJECTION 61%
125.0000 mL | Freq: Once | INTRAVENOUS | Status: AC | PRN
  Administered 2015-12-01: 125 mL via INTRAVENOUS

## 2015-12-01 MED ORDER — MORPHINE SULFATE (PF) 2 MG/ML IV SOLN
4.0000 mg | Freq: Once | INTRAVENOUS | Status: AC
Start: 2015-12-01 — End: 2015-12-01
  Administered 2015-12-01: 4 mg via INTRAVENOUS

## 2015-12-01 MED ORDER — ONDANSETRON 4 MG PO TBDP: 4.0000 mg | ORAL_TABLET | Freq: Three times a day (TID) | ORAL | 0 refills | Status: DC | PRN

## 2015-12-01 MED ORDER — MORPHINE SULFATE (PF) 2 MG/ML IV SOLN
INTRAVENOUS | Status: AC
  Administered 2015-12-01: 4 mg via INTRAVENOUS
  Filled 2015-12-01: qty 2

## 2015-12-01 MED ORDER — ONDANSETRON HCL 4 MG/2ML IJ SOLN
INTRAMUSCULAR | Status: AC
  Administered 2015-12-01: 4 mg via INTRAVENOUS
  Filled 2015-12-01: qty 2

## 2015-12-01 MED ORDER — MORPHINE SULFATE (PF) 2 MG/ML IV SOLN
4.0000 mg | Freq: Once | INTRAVENOUS | Status: AC
  Administered 2015-12-01: 4 mg via INTRAVENOUS
  Filled 2015-12-01: qty 2

## 2015-12-01 NOTE — ED Provider Notes (Signed)
[facility] Emergency Department Provider Note   ____________________________________________   None    (approximate)  I have reviewed the triage vital signs and the nursing notes.   HISTORY  Chief Complaint Abdominal Pain; Nausea; and Weakness   HPI Gerald [name] is a 50 y.o. male who reports sudden onset of abdominal pain yesterday after going to work. Pain is worse in the left upper quadrant across lower abdomen. He told the nurse he was little bit nauseated. He told me just not hungry. Takes a few bites and can't eat anymore. No vomiting. Pain is not worse with eating. Pain is worse with palpation or when he hit some bumps in the road coming here. Pain is severe at times reaching maximum intensity of 10 out of Past Medical History:  Diagnosis Date  . Abnormal LFTs   . Coronary artery disease    Mild nonobstructive coronary artery disease on previous cardiac catheterization most recently in August of 2013  . Diabetes mellitus without complication (HCC)    borderline  . Fundic gland polyps of stomach, benign   . Gastritis   . GERD (gastroesophageal reflux disease)   . Hepatic steatosis   . Hyperlipidemia   . Hypertension   . Myocardial infarction 09/2011  . Nonischemic cardiomyopathy (HCC)    Previous ejection fraction of 35-40% on echo. 50% on cardiac catheterization in August of 2013  . Obesity   . Sleep apnea     Patient Active Problem List   Diagnosis Date Noted  . Other chest pain 05/22/2014  . Mass of jaw 05/22/2014  . Right flank pain 11/23/2013  . Dysuria 11/23/2013  . Encounter for wellness examination 11/08/2013  . Retaining fluid 11/08/2013  . Urinary incontinence 03/14/2013  . Depression 01/13/2013  . Type 2 diabetes mellitus ([place]) 09/14/2012  . Nonischemic cardiomyopathy ([place])   . HLD (hyperlipidemia) 05/28/2012  . CAD (coronary artery disease) 03/30/2012  . Chronic pain syndrome 03/30/2012  . HTN (hypertension)  03/30/2012    Past Surgical History:  Procedure Laterality Date  . CARDIAC CATHETERIZATION N/A 09/2011   [facility]; EF 50% with 30% mid LAD stenosis and no obstructive disease.  Marland Kitchen CARDIAC CATHETERIZATION  09/2010   [facility]; Mid LAD 40% stenosis; Mid Circumflex:Normal; Mid RCA; Normal  . CARDIAC CATHETERIZATION    . COLONOSCOPY    . KNEE ARTHROSCOPY WITH MEDIAL MENISECTOMY Right 09/16/2012   Procedure: RIGHT KNEE ARTHROSCOPY WITH MEDIAL AND LATERAL MENISECTOMY, CHONDROPLASTY;  Surgeon: Ninetta Lights, MD;  Location: [place];  Service: Orthopedics;  Laterality: Right;  RIGHT KNEE SCOPE MEDIAL MENISCECTOMY    Prior to Admission medications   Medication Sig Start Date End Date Taking? Authorizing Provider  albuterol (PROVENTIL HFA) 108 (90 BASE) MCG/ACT inhaler Inhale 2 puffs into the lungs every 4 (four) hours as needed for wheezing or shortness of breath. 12/30/14   Carrie Mew, MD  aspirin EC 81 MG tablet Take 81 mg by mouth daily.    Historical Provider, MD  azithromycin (ZITHROMAX Z-PAK) 250 MG tablet Take 2 tablets (500 mg) on  Day 1,  followed by 1 tablet (250 mg) once daily on Days 2 through 5. Patient not taking: Reported on 10/18/2015 06/12/15   [name], MD  chlorpheniramine-HYDROcodone [facility] ER) 10-8 MG/5ML SUER Take 5 mLs by mouth 2 (two) times daily. Patient not taking: Reported on 10/18/2015 06/12/15   [name], MD  Cholecalciferol (VITAMIN D3) 5000 UNITS CAPS Take 5,000 Units by  mouth daily.    Historical Provider, MD  citalopram (CELEXA) 20 MG tablet Take 1 tablet (20 mg total) by mouth daily. 01/31/14   Jearld Fenton, NP  Dulaglutide (TRULICITY) 1.5 0000000 SOPN Inject 1.5 mg into the skin once a week.    Historical Provider, MD  furosemide (LASIX) 20 MG tablet Take 1 tablet (20 mg total) by mouth daily. 11/08/13   Lucille Passy, MD  glipiZIDE (GLUCOTROL) 5 MG tablet Take 5 mg by mouth daily before breakfast.    Historical  Provider, MD  lisinopril (PRINIVIL,ZESTRIL) 5 MG tablet Take 1 tablet (5 mg total) by mouth daily. Patient not taking: Reported on 10/18/2015 01/09/15 01/09/16  Harvest Dark, MD  lisinopril-hydrochlorothiazide (PRINZIDE,ZESTORETIC) 10-12.5 MG tablet Take 1 tablet by mouth daily. 05/31/15   Historical Provider, MD  metoprolol succinate (TOPROL-XL) 25 MG 24 hr tablet Take 1 tablet (25 mg total) by mouth daily. 11/08/13   Lucille Passy, MD  nitroGLYCERIN (NITROSTAT) 0.4 MG SL tablet Place 1 tablet (0.4 mg total) under the tongue every 5 (five) minutes as needed for chest pain. 11/08/13   Lucille Passy, MD  omeprazole (PRILOSEC) 40 MG capsule TAKE 1 CAPSULE (40 MG TOTAL) BY MOUTH DAILY. 10/22/15   Irene Shipper, MD  potassium chloride SA (K-DUR,KLOR-CON) 20 MEQ tablet Take 1 tablet (20 mEq total) by mouth daily. Patient not taking: Reported on 10/18/2015 11/08/13   Lucille Passy, MD  prochlorperazine (COMPAZINE) 10 MG tablet Take 1 tablet (10 mg total) by mouth every 6 (six) hours as needed for nausea. 10/18/15   Paulette Blanch, MD    Allergies Statins  Family History  Problem Relation Age of Onset  . Heart disease Father 12    MI  . Heart attack Father   . Stomach cancer Father   . Hypertension Mother   . Breast cancer Mother     Social History Social History  Substance Use Topics  . Smoking status: Never Smoker  . Smokeless tobacco: Never Used  . Alcohol use No     Comment: rare    Review of Systems Constitutional: No fever/chills Eyes: No visual changes. ENT: No sore throat. Cardiovascular: Denies chest pain. Respiratory: Denies shortness of breath. Gastrointestinal See history of present illness. Genitourinary: Negative for dysuria. Musculoskeletal: Negative for back pain. Skin: Negative for rash. Neurological: Negative for headaches, focal weakness or numbness.  10-point ROS otherwise negative.  ____________________________________________   PHYSICAL EXAM:  VITAL SIGNS: ED  Triage Vitals [12/01/15 0419]  Enc Vitals Group     BP 134/71     Pulse Rate 94     Resp 20     Temp 98.9 F (37.2 C)     Temp Source Oral     SpO2 96 %     Weight (!) 330 lb (149.7 kg)     Height 6\' 3"  (1.905 m)     Head Circumference      Peak Flow      Pain Score 10     Pain Loc      Pain Edu?      Excl. in [place]?     Constitutional: Alert and oriented.Ill appearing and in no acute distress. Eyes: Conjunctivae are normal. PERRL. EOMI. Head: Atraumatic. Nose: No congestion/rhinnorhea. Mouth/Throat: Mucous membranes are moist.  Oropharynx non-erythematous. Neck: No stridor Cardiovascular: Normal rate, regular rhythm. Grossly normal heart sounds.  Good peripheral circulation. Respiratory: Normal respiratory effort.  No retractions. Lungs CTAB. Gastrointestinal: Abdomen is soft  but tender to palpation percussion especially across lower abdomen somewhat more in the left upper quadrant as well. Bowel sounds are positive but somewhat decreased. There is no organomegaly.Marland Kitchen }Musculoskeletal: No lower extremity tenderness nor edema.  No joint effusions. Neurologic:  Normal speech and language. No gross focal neurologic deficits are appreciated. No gait instability.  ____________________________________________   LABS (all labs ordered are listed, but only abnormal results are displayed)  Labs Reviewed  COMPREHENSIVE METABOLIC PANEL - Abnormal; Notable for the following:       Result Value   Glucose, Bld 217 (*)    Calcium 8.7 (*)    AST 48 (*)    ALT 74 (*)    All other components within normal limits  CULTURE, GROUP A STREP ([place])  CULTURE, GROUP A STREP ([place])  LIPASE, BLOOD  CBC  TROPONIN I  LACTIC ACID, PLASMA  LACTIC ACID, PLASMA  CBC WITH DIFFERENTIAL/PLATELET  URINALYSIS COMPLETEWITH MICROSCOPIC ([facility] ONLY)   ____________________________________________  EKG  EKG read and interpreted by me shows normal sinus rhythm rate of 91 normal axis no acute ST-T wave  changes similar to an EKG from 10/17/2015 ____________________________________________  RADIOLOGY  Pending ____________________________________________   PROCEDURES  Procedure(s) performed:  Procedures  Critical Care performed:   ____________________________________________   INITIAL IMPRESSION / ASSESSMENT AND PLAN / ED COURSE  Pertinent labs & imaging results that were available during my care of the patient were reviewed by me and considered in my medical decision making (see chart for details).   Clinical Course   Patient awaiting CT scan. Dr. Alfred Levins will follow the results and determine management based on those.  ____________________________________________   FINAL CLINICAL IMPRESSION(S) / ED DIAGNOSES  Final diagnoses:  Lower abdominal pain      NEW MEDICATIONS STARTED DURING THIS VISIT:  New Prescriptions   No medications on file     Note:  This document was prepared using Dragon voice recognition software and may include unintentional dictation errors.    Nena Polio, MD 12/01/15 [phone]

## 2015-12-01 NOTE — ED Triage Notes (Signed)
Pt presents to ED with worsening generalized abd and nausea. Pt states he had a sudden onset of his pain this afternoon just prior to going to work. Pt reports pain with palpation and states he is usually a "big eater" but currently is not wanting to eat. Denies similar symptoms previously.

## 2015-12-01 NOTE — ED Provider Notes (Signed)
-----------------------------------------   9:05 AM on 12/01/2015 -----------------------------------------   Blood pressure 122/81, pulse 84, temperature 98.9 F (37.2 C), temperature source Oral, resp. rate 16, height 6\' 3"  (1.905 m), weight (!) 330 lb (149.7 kg), SpO2 100 %.  Assuming care from Dr. Cinda Quest of MYRICK GRIFFO is a 50 y.o. male with a chief complaint of Abdominal Pain; Nausea; and Weakness .    In summary, 32M who presented for evaluation of lower abdominal pain that started suddenly yesterday associated with nausea. Patient endorses dysuria that has been present for months. Saw PCP for this with no diagnosis.  VS WNL  Blood work showing normal CMP, lipase, lactate, lipase, CBC. Troponin and EKG with no ischemia.   Plan to follow up CT a/p and UA.  CT shows no acute pathology. Re-evaluated patient who is very tender on the suprapubic region. Bladder scan showing 100cc. UA pending. Will redose pain and nausea meds.   _________________________ 10:41 AM on 12/01/2015 -----------------------------------------  UA showing rare bacteria but no other evidence of a urinary tract infection. Urine culture has been sent. Patient's pain has improved. He is tolerating by mouth. We'll discharge home with strict return precautions and close follow-up with PCP in 24 hours for recheck. I explained to the patient that since we are unable to find an etiology for his pain that he needs to return if the pain gets different, worse, if he develops fever, or any new symptoms. Patient agrees to return. Otherwise he will follow up with his PCP Monday morning.   Rudene Re, MD 12/01/15 [phone]

## 2015-12-01 NOTE — Discharge Instructions (Signed)

## 2015-12-01 NOTE — ED Notes (Signed)
Pt transported to CT ?

## 2015-12-02 LAB — URINE CULTURE: Culture: <10000 — AB

## 2015-12-03 LAB — CULTURE, GROUP A STREP (THRC)

## 2015-12-23 ENCOUNTER — Other Ambulatory Visit: Payer: Self-pay | Admitting: Internal Medicine

## 2016-02-25 DIAGNOSIS — G4733 Obstructive sleep apnea (adult) (pediatric): Secondary | ICD-10-CM | POA: Diagnosis not present

## 2016-02-28 ENCOUNTER — Ambulatory Visit
Admission: RE | Admit: 2016-02-28 | Discharge: 2016-02-28 | Disposition: A | Discharge status: Home / Self Care | Payer: 59 | Source: Ambulatory Visit | Attending: Nurse Practitioner | Admitting: Nurse Practitioner

## 2016-02-28 ENCOUNTER — Other Ambulatory Visit: Payer: Self-pay | Admitting: Nurse Practitioner

## 2016-02-28 DIAGNOSIS — J9811 Atelectasis: Secondary | ICD-10-CM | POA: Insufficient documentation

## 2016-02-28 DIAGNOSIS — R0602 Shortness of breath: Secondary | ICD-10-CM | POA: Diagnosis not present

## 2016-02-28 DIAGNOSIS — D509 Iron deficiency anemia, unspecified: Secondary | ICD-10-CM | POA: Diagnosis not present

## 2016-02-28 DIAGNOSIS — E119 Type 2 diabetes mellitus without complications: Secondary | ICD-10-CM | POA: Diagnosis not present

## 2016-02-28 DIAGNOSIS — J069 Acute upper respiratory infection, unspecified: Secondary | ICD-10-CM | POA: Diagnosis not present

## 2016-02-28 DIAGNOSIS — G4710 Hypersomnia, unspecified: Secondary | ICD-10-CM | POA: Diagnosis not present

## 2016-02-28 DIAGNOSIS — I517 Cardiomegaly: Secondary | ICD-10-CM | POA: Insufficient documentation

## 2016-02-28 DIAGNOSIS — R5383 Other fatigue: Secondary | ICD-10-CM | POA: Diagnosis not present

## 2016-03-25 DIAGNOSIS — E1165 Type 2 diabetes mellitus with hyperglycemia: Secondary | ICD-10-CM | POA: Diagnosis not present

## 2016-03-25 DIAGNOSIS — I10 Essential (primary) hypertension: Secondary | ICD-10-CM | POA: Diagnosis not present

## 2016-04-07 DIAGNOSIS — G4733 Obstructive sleep apnea (adult) (pediatric): Secondary | ICD-10-CM | POA: Diagnosis not present

## 2016-04-15 DIAGNOSIS — G4733 Obstructive sleep apnea (adult) (pediatric): Secondary | ICD-10-CM | POA: Diagnosis not present

## 2016-04-15 DIAGNOSIS — G4726 Circadian rhythm sleep disorder, shift work type: Secondary | ICD-10-CM | POA: Diagnosis not present

## 2016-04-15 DIAGNOSIS — R0602 Shortness of breath: Secondary | ICD-10-CM | POA: Diagnosis not present

## 2016-04-29 ENCOUNTER — Other Ambulatory Visit: Payer: Self-pay | Admitting: Internal Medicine

## 2016-05-07 DIAGNOSIS — G4733 Obstructive sleep apnea (adult) (pediatric): Secondary | ICD-10-CM | POA: Diagnosis not present

## 2016-06-06 DIAGNOSIS — G4733 Obstructive sleep apnea (adult) (pediatric): Secondary | ICD-10-CM | POA: Diagnosis not present

## 2016-06-08 ENCOUNTER — Emergency Department: Payer: 59

## 2016-06-08 ENCOUNTER — Observation Stay
Admission: EM | Admit: 2016-06-08 | Discharge: 2016-06-11 | Disposition: A | Discharge status: Home / Self Care | Payer: 59 | Attending: Specialist | Admitting: Specialist

## 2016-06-08 ENCOUNTER — Encounter: Payer: Self-pay | Admitting: Emergency Medicine

## 2016-06-08 DIAGNOSIS — G4733 Obstructive sleep apnea (adult) (pediatric): Secondary | ICD-10-CM | POA: Insufficient documentation

## 2016-06-08 DIAGNOSIS — I428 Other cardiomyopathies: Secondary | ICD-10-CM | POA: Diagnosis not present

## 2016-06-08 DIAGNOSIS — E785 Hyperlipidemia, unspecified: Secondary | ICD-10-CM | POA: Insufficient documentation

## 2016-06-08 DIAGNOSIS — E119 Type 2 diabetes mellitus without complications: Secondary | ICD-10-CM | POA: Insufficient documentation

## 2016-06-08 DIAGNOSIS — Z6841 Body Mass Index (BMI) 40.0 and over, adult: Secondary | ICD-10-CM | POA: Diagnosis not present

## 2016-06-08 DIAGNOSIS — K760 Fatty (change of) liver, not elsewhere classified: Secondary | ICD-10-CM | POA: Insufficient documentation

## 2016-06-08 DIAGNOSIS — I429 Cardiomyopathy, unspecified: Secondary | ICD-10-CM | POA: Diagnosis not present

## 2016-06-08 DIAGNOSIS — K219 Gastro-esophageal reflux disease without esophagitis: Secondary | ICD-10-CM | POA: Insufficient documentation

## 2016-06-08 DIAGNOSIS — R0789 Other chest pain: Secondary | ICD-10-CM | POA: Diagnosis not present

## 2016-06-08 DIAGNOSIS — K2970 Gastritis, unspecified, without bleeding: Secondary | ICD-10-CM | POA: Insufficient documentation

## 2016-06-08 DIAGNOSIS — I2510 Atherosclerotic heart disease of native coronary artery without angina pectoris: Secondary | ICD-10-CM | POA: Insufficient documentation

## 2016-06-08 DIAGNOSIS — I10 Essential (primary) hypertension: Secondary | ICD-10-CM | POA: Diagnosis not present

## 2016-06-08 DIAGNOSIS — I252 Old myocardial infarction: Secondary | ICD-10-CM | POA: Insufficient documentation

## 2016-06-08 DIAGNOSIS — Z794 Long term (current) use of insulin: Secondary | ICD-10-CM | POA: Insufficient documentation

## 2016-06-08 DIAGNOSIS — F329 Major depressive disorder, single episode, unspecified: Secondary | ICD-10-CM | POA: Diagnosis not present

## 2016-06-08 DIAGNOSIS — R079 Chest pain, unspecified: Secondary | ICD-10-CM

## 2016-06-08 DIAGNOSIS — Z7982 Long term (current) use of aspirin: Secondary | ICD-10-CM | POA: Insufficient documentation

## 2016-06-08 DIAGNOSIS — Z8249 Family history of ischemic heart disease and other diseases of the circulatory system: Secondary | ICD-10-CM | POA: Insufficient documentation

## 2016-06-08 LAB — CBC
HEMATOCRIT: 43.4 % (ref 40.0–52.0)
HEMOGLOBIN: 14.4 g/dL (ref 13.0–18.0)
MCH: 28.7 pg (ref 26.0–34.0)
MCHC: 33.2 g/dL (ref 32.0–36.0)
MCV: 86.5 fL (ref 80.0–100.0)
Platelets: 241 10*3/uL (ref 150–440)
RBC: 5.02 MIL/uL (ref 4.40–5.90)
RDW: 14.4 % (ref 11.5–14.5)
WBC: 8.3 10*3/uL (ref 3.8–10.6)

## 2016-06-08 LAB — BASIC METABOLIC PANEL
Anion gap: 9 (ref 5–15)
BUN: 11 mg/dL (ref 6–20)
CHLORIDE: 101 mmol/L (ref 101–111)
CO2: 27 mmol/L (ref 22–32)
Calcium: 9.5 mg/dL (ref 8.9–10.3)
Creatinine, Ser: 0.79 mg/dL (ref 0.61–1.24)
GFR calc non Af Amer: >60 mL/min (ref >60)
Glucose, Bld: 204 mg/dL — ABNORMAL HIGH (ref 65–99)
POTASSIUM: 3.8 mmol/L (ref 3.5–5.1)
SODIUM: 137 mmol/L (ref 135–145)

## 2016-06-08 LAB — GLUCOSE, CAPILLARY
GLUCOSE-CAPILLARY: 151 mg/dL — AB (ref 65–99)
Glucose-Capillary: 153 mg/dL — ABNORMAL HIGH (ref 65–99)
Glucose-Capillary: 136 mg/dL — ABNORMAL HIGH (ref 65–99)

## 2016-06-08 LAB — TROPONIN I

## 2016-06-08 LAB — BRAIN NATRIURETIC PEPTIDE: B NATRIURETIC PEPTIDE 5: 16 pg/mL (ref 0.0–100.0)

## 2016-06-08 MED ORDER — MORPHINE SULFATE (PF) 2 MG/ML IV SOLN
2.0000 mg | Freq: Once | INTRAVENOUS | Status: AC
  Administered 2016-06-08: 2 mg via INTRAVENOUS
  Filled 2016-06-08: qty 1

## 2016-06-08 MED ORDER — SODIUM CHLORIDE 0.9 % IV SOLN: 250.0000 mL | INTRAVENOUS | Status: DC | PRN

## 2016-06-08 MED ORDER — ASPIRIN 81 MG PO CHEW
243.0000 mg | CHEWABLE_TABLET | Freq: Once | ORAL | Status: AC
  Administered 2016-06-08: 243 mg via ORAL

## 2016-06-08 MED ORDER — NITROGLYCERIN 0.2 MG/HR TD PT24
0.2000 mg | MEDICATED_PATCH | Freq: Every day | TRANSDERMAL | Status: DC
  Administered 2016-06-08 – 2016-06-10 (×3): 0.2 mg via TRANSDERMAL
  Filled 2016-06-08 (×4): qty 1

## 2016-06-08 MED ORDER — ENOXAPARIN SODIUM 40 MG/0.4ML ~~LOC~~ SOLN
40.0000 mg | Freq: Two times a day (BID) | SUBCUTANEOUS | Status: DC
  Administered 2016-06-08 – 2016-06-10 (×4): 40 mg via SUBCUTANEOUS
  Filled 2016-06-08 (×4): qty 0.4

## 2016-06-08 MED ORDER — ASPIRIN 81 MG PO CHEW
263.0000 mg | CHEWABLE_TABLET | Freq: Once | ORAL | Status: DC
  Filled 2016-06-08: qty 3

## 2016-06-08 MED ORDER — METOPROLOL SUCCINATE ER 25 MG PO TB24
25.0000 mg | ORAL_TABLET | Freq: Every day | ORAL | Status: DC
  Administered 2016-06-08: 25 mg via ORAL
  Filled 2016-06-08: qty 1

## 2016-06-08 MED ORDER — PANTOPRAZOLE SODIUM 40 MG PO TBEC
40.0000 mg | DELAYED_RELEASE_TABLET | Freq: Every day | ORAL | Status: DC
  Administered 2016-06-09 – 2016-06-10 (×2): 40 mg via ORAL
  Filled 2016-06-08 (×2): qty 1

## 2016-06-08 MED ORDER — MORPHINE BOLUS VIA INFUSION: 2.0000 mg | Freq: Once | INTRAVENOUS | Status: DC

## 2016-06-08 MED ORDER — ALBUTEROL SULFATE (2.5 MG/3ML) 0.083% IN NEBU
2.5000 mg | INHALATION_SOLUTION | RESPIRATORY_TRACT | Status: DC | PRN
  Administered 2016-06-08: 2.5 mg via RESPIRATORY_TRACT
  Filled 2016-06-08: qty 3

## 2016-06-08 MED ORDER — CITALOPRAM HYDROBROMIDE 20 MG PO TABS
20.0000 mg | ORAL_TABLET | Freq: Every day | ORAL | Status: DC
  Administered 2016-06-09 – 2016-06-10 (×2): 20 mg via ORAL
  Filled 2016-06-08 (×2): qty 1

## 2016-06-08 MED ORDER — LISINOPRIL 20 MG PO TABS
20.0000 mg | ORAL_TABLET | Freq: Every day | ORAL | Status: DC
  Administered 2016-06-09 – 2016-06-10 (×2): 20 mg via ORAL
  Filled 2016-06-08 (×2): qty 1

## 2016-06-08 MED ORDER — ONDANSETRON HCL 4 MG/2ML IJ SOLN
4.0000 mg | Freq: Once | INTRAMUSCULAR | Status: AC
  Administered 2016-06-08: 4 mg via INTRAVENOUS
  Filled 2016-06-08: qty 2

## 2016-06-08 MED ORDER — INSULIN ASPART 100 UNIT/ML ~~LOC~~ SOLN
0.0000 [IU] | Freq: Three times a day (TID) | SUBCUTANEOUS | Status: DC
  Administered 2016-06-08 – 2016-06-10 (×5): 2 [IU] via SUBCUTANEOUS
  Administered 2016-06-10: 1 [IU] via SUBCUTANEOUS
  Administered 2016-06-10: 2 [IU] via SUBCUTANEOUS
  Filled 2016-06-08: qty 2
  Filled 2016-06-08: qty 1
  Filled 2016-06-08 (×4): qty 2

## 2016-06-08 MED ORDER — LISINOPRIL-HYDROCHLOROTHIAZIDE 20-25 MG PO TABS: 1.0000 | ORAL_TABLET | Freq: Every day | ORAL | Status: DC

## 2016-06-08 MED ORDER — SODIUM CHLORIDE 0.9% FLUSH
3.0000 mL | Freq: Two times a day (BID) | INTRAVENOUS | Status: DC
  Administered 2016-06-08 – 2016-06-10 (×5): 3 mL via INTRAVENOUS

## 2016-06-08 MED ORDER — INSULIN DETEMIR 100 UNIT/ML ~~LOC~~ SOLN
26.0000 [IU] | Freq: Every morning | SUBCUTANEOUS | Status: DC
  Administered 2016-06-09 – 2016-06-10 (×2): 26 [IU] via SUBCUTANEOUS
  Filled 2016-06-08 (×3): qty 0.26

## 2016-06-08 MED ORDER — HYDROCHLOROTHIAZIDE 25 MG PO TABS
25.0000 mg | ORAL_TABLET | Freq: Every day | ORAL | Status: DC
  Administered 2016-06-09 – 2016-06-10 (×2): 25 mg via ORAL
  Filled 2016-06-08 (×2): qty 1

## 2016-06-08 MED ORDER — VITAMIN D 1000 UNITS PO TABS
5000.0000 [IU] | ORAL_TABLET | Freq: Every day | ORAL | Status: DC
  Administered 2016-06-09 – 2016-06-10 (×2): 5000 [IU] via ORAL
  Filled 2016-06-08 (×2): qty 5

## 2016-06-08 MED ORDER — NITROGLYCERIN 0.4 MG SL SUBL
SUBLINGUAL_TABLET | SUBLINGUAL | Status: AC
Start: 2016-06-08 — End: 2016-06-08
  Administered 2016-06-08: 0.4 mg via SUBLINGUAL
  Filled 2016-06-08: qty 3

## 2016-06-08 MED ORDER — ONDANSETRON 4 MG PO TBDP
4.0000 mg | ORAL_TABLET | Freq: Three times a day (TID) | ORAL | Status: DC | PRN
  Administered 2016-06-08: 4 mg via ORAL
  Filled 2016-06-08 (×2): qty 1

## 2016-06-08 MED ORDER — MORPHINE SULFATE (PF) 2 MG/ML IV SOLN
2.0000 mg | INTRAVENOUS | Status: DC | PRN
  Administered 2016-06-08 – 2016-06-11 (×11): 2 mg via INTRAVENOUS
  Filled 2016-06-08 (×11): qty 1

## 2016-06-08 MED ORDER — KETOROLAC TROMETHAMINE 30 MG/ML IJ SOLN
30.0000 mg | Freq: Once | INTRAMUSCULAR | Status: AC
  Administered 2016-06-08: 30 mg via INTRAVENOUS
  Filled 2016-06-08: qty 1

## 2016-06-08 MED ORDER — NITROGLYCERIN 0.4 MG SL SUBL
0.4000 mg | SUBLINGUAL_TABLET | SUBLINGUAL | Status: DC | PRN
  Administered 2016-06-08 – 2016-06-11 (×3): 0.4 mg via SUBLINGUAL
  Filled 2016-06-08: qty 1

## 2016-06-08 MED ORDER — ASPIRIN EC 81 MG PO TBEC
81.0000 mg | DELAYED_RELEASE_TABLET | Freq: Every day | ORAL | Status: DC
Start: 2016-06-09 — End: 2016-06-11
  Administered 2016-06-09 – 2016-06-10 (×2): 81 mg via ORAL
  Filled 2016-06-08 (×2): qty 1

## 2016-06-08 MED ORDER — SODIUM CHLORIDE 0.9% FLUSH
3.0000 mL | INTRAVENOUS | Status: DC | PRN
  Administered 2016-06-08: 3 mL via INTRAVENOUS
  Filled 2016-06-08: qty 3

## 2016-06-08 MED ORDER — MORPHINE SULFATE (PF) 4 MG/ML IV SOLN
4.0000 mg | Freq: Once | INTRAVENOUS | Status: AC
  Administered 2016-06-08: 4 mg via INTRAVENOUS
  Filled 2016-06-08: qty 1

## 2016-06-08 MED ORDER — DULAGLUTIDE 1.5 MG/0.5ML ~~LOC~~ SOAJ
1.5000 mg | SUBCUTANEOUS | Status: DC
  Filled 2016-06-08: qty 0.5

## 2016-06-08 NOTE — H&P (Signed)
Gerald [name] is an 51 y.o. male.   Chief Complaint: Chest pain HPI: This is a 51 year old with history of nonobstructive coronary artery disease. Earlier this morning he awoke with nausea and then chest pressure which lasted for several hours. He became short of breath during some of this. He also said he had some numbness down his left leg. He states that these are exactly the same symptoms when he had an MI in 2013. And since then he has not had symptoms like this. Currently he is pain-free.  Past Medical History:  Diagnosis Date  . Abnormal LFTs   . Coronary artery disease    Mild nonobstructive coronary artery disease on previous cardiac catheterization most recently in August of 2013  . Diabetes mellitus without complication (HCC)    borderline  . Fundic gland polyps of stomach, benign   . Gastritis   . GERD (gastroesophageal reflux disease)   . Hepatic steatosis   . Hyperlipidemia   . Hypertension   . Myocardial infarction ([place]) 09/2011  . Nonischemic cardiomyopathy (HCC)    Previous ejection fraction of 35-40% on echo. 50% on cardiac catheterization in August of 2013  . Obesity   . Sleep apnea     Past Surgical History:  Procedure Laterality Date  . CARDIAC CATHETERIZATION N/A 09/2011   [facility]; EF 50% with 30% mid LAD stenosis and no obstructive disease.  Marland Kitchen CARDIAC CATHETERIZATION  09/2010   [facility]; Mid LAD 40% stenosis; Mid Circumflex:Normal; Mid RCA; Normal  . CARDIAC CATHETERIZATION    . COLONOSCOPY    . KNEE ARTHROSCOPY WITH MEDIAL MENISECTOMY Right 09/16/2012   Procedure: RIGHT KNEE ARTHROSCOPY WITH MEDIAL AND LATERAL MENISECTOMY, CHONDROPLASTY;  Surgeon: Ninetta Lights, MD;  Location: [facility];  Service: Orthopedics;  Laterality: Right;  RIGHT KNEE SCOPE MEDIAL MENISCECTOMY    Family History  Problem Relation Age of Onset  . Heart disease Father 16    MI  . Heart attack Father   . Stomach cancer Father   . Hypertension Mother   . Breast cancer  Mother    Social History:  reports that he has never smoked. He has never used smokeless tobacco. He reports that he does not drink alcohol or use drugs.  Allergies:  Allergies  Allergen Reactions  . Statins Rash          (Not in a hospital admission)  Results for orders placed or performed during the hospital encounter of 06/08/16 (from the past 48 hour(s))  Basic metabolic panel     Status: Abnormal   Collection Time: 06/08/16 11:48 AM  Result Value Ref Range   Sodium 137 135 - 145 mmol/L   Potassium 3.8 3.5 - 5.1 mmol/L   Chloride 101 101 - 111 mmol/L   CO2 27 22 - 32 mmol/L   Glucose, Bld 204 (H) 65 - 99 mg/dL   BUN 11 6 - 20 mg/dL   Creatinine, Ser 0.79 0.61 - 1.24 mg/dL   Calcium 9.5 8.9 - 10.3 mg/dL   GFR calc non Af Amer >60 >60 mL/min   GFR calc Af Amer >60 >60 mL/min    Comment: (NOTE) The eGFR has been calculated using the CKD EPI equation. This calculation has not been validated in all clinical situations. eGFR's persistently <60 mL/min signify possible Chronic Kidney Disease.    Anion gap 9 5 - 15  CBC     Status: None   Collection Time: 06/08/16 11:48 AM  Result Value Ref Range  WBC 8.3 3.8 - 10.6 K/uL   RBC 5.02 4.40 - 5.90 MIL/uL   Hemoglobin 14.4 13.0 - 18.0 g/dL   HCT 43.4 40.0 - 52.0 %   MCV 86.5 80.0 - 100.0 fL   MCH 28.7 26.0 - 34.0 pg   MCHC 33.2 32.0 - 36.0 g/dL   RDW 14.4 11.5 - 14.5 %   Platelets 241 150 - 440 K/uL  Troponin I     Status: None   Collection Time: 06/08/16 11:48 AM  Result Value Ref Range   Troponin I <0.03 <0.03 ng/mL  Brain natriuretic peptide     Status: None   Collection Time: 06/08/16 11:48 AM  Result Value Ref Range   B Natriuretic Peptide 16.0 0.0 - 100.0 pg/mL   Dg Chest 2 View  Result Date: 06/08/2016 CLINICAL DATA:  Acute chest pain. EXAM: CHEST  2 VIEW COMPARISON:  02/28/2016 and prior exam FINDINGS: Cardiomegaly identified. There is no evidence of focal airspace disease, pulmonary edema, suspicious  pulmonary nodule/mass, pleural effusion, or pneumothorax. No acute bony abnormalities are identified. IMPRESSION: Cardiomegaly without evidence of acute cardiopulmonary disease. Electronically Signed   By: Margarette Canada M.D.   On: 06/08/2016 12:41    Review of Systems  Constitutional: Negative for fever.  HENT: Negative for hearing loss.   Eyes: Negative for blurred vision.  Respiratory: Positive for shortness of breath.   Cardiovascular: Positive for chest pain.  Gastrointestinal: Positive for nausea.  Genitourinary: Negative for dysuria.  Musculoskeletal: Negative for joint pain.  Skin: Negative for rash.  Neurological: Negative for dizziness.    Blood pressure 136/73, pulse 78, temperature 98.3 F (36.8 C), temperature source Oral, resp. rate 17, height 6' 2"  (1.88 m), weight (!) 150.6 kg (332 lb), SpO2 95 %. Physical Exam  Constitutional: He is oriented to person, place, and time. He appears well-developed and well-nourished. No distress.  HENT:  Head: Normocephalic and atraumatic.  Mouth/Throat: Oropharynx is clear and moist. No oropharyngeal exudate.  Eyes: Pupils are equal, round, and reactive to light. No scleral icterus.  Neck: Normal range of motion. Neck supple. No JVD present. No tracheal deviation present. No thyromegaly present.  Cardiovascular: Normal rate and regular rhythm.   No murmur heard. Respiratory: Effort normal and breath sounds normal. He exhibits no tenderness.  GI: Soft. Bowel sounds are normal. He exhibits no mass. There is no tenderness.  Musculoskeletal: Normal range of motion. He exhibits no edema or tenderness.  Lymphadenopathy:    He has no cervical adenopathy.  Neurological: He is alert and oriented to person, place, and time. No cranial nerve deficit.  Skin: Skin is warm and dry.     Assessment/Plan 1. Chest pain. With his history of nonobstructive coronary artery disease and in having the same symptoms he had when he had an MI years ago have to  be concerned this could be an acute coronary syndrome. His EKG is normal and his first set of troponin is. We'll go ahead and place him on aspirin. When restart the beta blocker that he hasn't been taking. If he rules out we'll set him up for nuclear stress testing. If rules him and will consult cardiology for further workup.  2. Nonobstructive coronary artery disease. Continue aspirin he is on ACE inhibitor. We'll restart his beta blocker that he hasn't been taking. Cardiac catheter in 2013 showed a 30% LAD lesion.  3. Nonobstructive cardiomyopathy. His cardiac catheter in 2013 showed an EF around 35-40%. However on echocardiogram done a year later  was read as an EF 50%. He is on ACE inhibitor and we are restarting a beta blocker. The stress test should give [place] another indication of his EF. Can adjust treatment from that point.  4. Hypertension. Continue his current medications blood pressure is elevated in the ER but he hasn't had his daily medications yet.  Total time spent 45 minutes  Baxter Hire, MD 06/08/2016, 1:56 PM

## 2016-06-08 NOTE — Progress Notes (Signed)
Informed physician about ongoing chest pain after medication administration, Dr.Willis placed order for toradol.

## 2016-06-08 NOTE — Progress Notes (Signed)
Patient is complaining of chest pain 8/10. VSS. ECG normal. Patient does not appear to be in distress but states he does not feel weill. Will continue to monitor and assess.

## 2016-06-08 NOTE — Progress Notes (Signed)
Patient admitted to room 257 from the ED. A&o x4, VSS, complaining of 8/10 chest pain at this time - Dr. Edwina Barth made aware. Heart monitor applied to the patient, verified with CCMD. Head to toe skin assessment completed with Vicenta Dunning, RN - no skin issues noted at this time. PRN medication given for pain, will continue to reassess and manage appropriately.

## 2016-06-08 NOTE — Progress Notes (Signed)
Patient still complaining of 8/10 chest pain radiating to the left arm, nausea, weakness, shaky, mildly diaphoretic. Dr. Edwina Barth updated on patient status - no new orders. Will continue to reassess.

## 2016-06-08 NOTE — Progress Notes (Signed)
Anticoagulation monitoring(Lovenox):  51 yo male ordered Lovenox 40 mg Q24h  Filed Weights   06/08/16 1147  Weight: (!) 332 lb (150.6 kg)   BMI 42.7   Lab Results  Component Value Date   CREATININE 0.79 06/08/2016   CREATININE 0.96 12/01/2015   CREATININE 1.05 10/17/2015   Estimated Creatinine Clearance: 173.2 mL/min (by C-G formula based on SCr of 0.79 mg/dL). Hemoglobin & Hematocrit     Component Value Date/Time   HGB 14.4 06/08/2016 1148   HGB 14.5 05/22/2014 1609   HCT 43.4 06/08/2016 1148   HCT 43.0 05/22/2014 1609     Per Protocol for Patient with estCrcl > 30 ml/min and BMI > 40, will transition to Lovenox 40 mg Q12h.

## 2016-06-08 NOTE — Progress Notes (Signed)
Patient called this nurse into the room, had complaints of chest pain, diaphoresis, shaking. Upon assessment, FSBS 153, BP 112/57, HR 73, O2 90% on room air. Placed on 2 L O2 via nasal cannula, instructed patient on deep breathing exercises. Will reassess patient comfort level.

## 2016-06-08 NOTE — ED Provider Notes (Signed)
[facility] Emergency Department Provider Note  Time seen: 1:10 PM  I have reviewed the triage vital signs and the nursing notes.   HISTORY  Chief Complaint Chest Pain    HPI Gerald [name] is a 51 y.o. male with a past medical history of diabetes, gastric reflux, hypertension, hyperlipidemia, previous MI, who presents to the emergency department for chest pain. According to the patient he had a small MI several years ago, with no stent placed, but per patient a blockage on the cardiac catheterization.Patient states since that time he has not had chest pain until 10:00 this morning when he describes significant central to left-sided chest pressure, along with significant nausea several episodes of vomiting, diaphoresis and shortness of breath. Patient states currently he has mild-to-moderate chest discomfort which he rates as a 3/10 continues to feel nauseated but denies any current shortness of breath. Denies any increased lower extremity edema.  Past Medical History:  Diagnosis Date  . Abnormal LFTs   . Coronary artery disease    Mild nonobstructive coronary artery disease on previous cardiac catheterization most recently in August of 2013  . Diabetes mellitus without complication (HCC)    borderline  . Fundic gland polyps of stomach, benign   . Gastritis   . GERD (gastroesophageal reflux disease)   . Hepatic steatosis   . Hyperlipidemia   . Hypertension   . Myocardial infarction ([place]) 09/2011  . Nonischemic cardiomyopathy (HCC)    Previous ejection fraction of 35-40% on echo. 50% on cardiac catheterization in August of 2013  . Obesity   . Sleep apnea     Patient Active Problem List   Diagnosis Date Noted  . Other chest pain 05/22/2014  . Mass of jaw 05/22/2014  . Right flank pain 11/23/2013  . Dysuria 11/23/2013  . Encounter for wellness examination 11/08/2013  . Retaining fluid 11/08/2013  . Urinary incontinence 03/14/2013  . Depression  01/13/2013  . Type 2 diabetes mellitus ([place]) 09/14/2012  . Nonischemic cardiomyopathy ([place])   . HLD (hyperlipidemia) 05/28/2012  . CAD (coronary artery disease) 03/30/2012  . Chronic pain syndrome 03/30/2012  . HTN (hypertension) 03/30/2012    Past Surgical History:  Procedure Laterality Date  . CARDIAC CATHETERIZATION N/A 09/2011   [facility]; EF 50% with 30% mid LAD stenosis and no obstructive disease.  Marland Kitchen CARDIAC CATHETERIZATION  09/2010   [facility]; Mid LAD 40% stenosis; Mid Circumflex:Normal; Mid RCA; Normal  . CARDIAC CATHETERIZATION    . COLONOSCOPY    . KNEE ARTHROSCOPY WITH MEDIAL MENISECTOMY Right 09/16/2012   Procedure: RIGHT KNEE ARTHROSCOPY WITH MEDIAL AND LATERAL MENISECTOMY, CHONDROPLASTY;  Surgeon: Ninetta Lights, MD;  Location: [facility];  Service: Orthopedics;  Laterality: Right;  RIGHT KNEE SCOPE MEDIAL MENISCECTOMY    Prior to Admission medications   Medication Sig Start Date End Date Taking? Authorizing Provider  albuterol (PROVENTIL HFA) 108 (90 BASE) MCG/ACT inhaler Inhale 2 puffs into the lungs every 4 (four) hours as needed for wheezing or shortness of breath. 12/30/14   Carrie Mew, MD  aspirin EC 81 MG tablet Take 81 mg by mouth daily.    [provider]  azithromycin (ZITHROMAX Z-PAK) 250 MG tablet Take 2 tablets (500 mg) on  Day 1,  followed by 1 tablet (250 mg) once daily on Days 2 through 5. Patient not taking: Reported on 10/18/2015 06/12/15   [name], MD  chlorpheniramine-HYDROcodone [facility] PENNKINETIC ER) 10-8 MG/5ML SUER Take 5 mLs by mouth 2 (two)  times daily. Patient not taking: Reported on 10/18/2015 06/12/15   [name], MD  Cholecalciferol (VITAMIN D3) 5000 UNITS CAPS Take 5,000 Units by mouth daily.    [provider]  citalopram (CELEXA) 20 MG tablet Take 1 tablet (20 mg total) by mouth daily. 01/31/14   Jearld Fenton, NP  Dulaglutide (TRULICITY) 1.5 TW/6.5KC SOPN Inject 1.5 mg into the skin  once a week.    [provider]  furosemide (LASIX) 20 MG tablet Take 1 tablet (20 mg total) by mouth daily. 11/08/13   Lucille Passy, MD  glipiZIDE (GLUCOTROL) 5 MG tablet Take 5 mg by mouth daily before breakfast.    [provider]  lisinopril-hydrochlorothiazide (PRINZIDE,ZESTORETIC) 10-12.5 MG tablet Take 1 tablet by mouth daily. 05/31/15   [provider]  metoprolol succinate (TOPROL-XL) 25 MG 24 hr tablet Take 1 tablet (25 mg total) by mouth daily. 11/08/13   Lucille Passy, MD  nitroGLYCERIN (NITROSTAT) 0.4 MG SL tablet Place 1 tablet (0.4 mg total) under the tongue every 5 (five) minutes as needed for chest pain. 11/08/13   Lucille Passy, MD  omeprazole (PRILOSEC) 40 MG capsule TAKE 1 CAPSULE (40 MG TOTAL) BY MOUTH DAILY. 04/29/16   Irene Shipper, MD  ondansetron (ZOFRAN ODT) 4 MG disintegrating tablet Take 1 tablet (4 mg total) by mouth every 8 (eight) hours as needed for nausea or vomiting. 12/01/15   Rudene Re, MD  potassium chloride SA (K-DUR,KLOR-CON) 20 MEQ tablet Take 1 tablet (20 mEq total) by mouth daily. Patient not taking: Reported on 10/18/2015 11/08/13   Lucille Passy, MD  prochlorperazine (COMPAZINE) 10 MG tablet Take 1 tablet (10 mg total) by mouth every 6 (six) hours as needed for nausea. 10/18/15   Paulette Blanch, MD    Allergies  Allergen Reactions  . Statins Rash         Family History  Problem Relation Age of Onset  . Heart disease Father 60    MI  . Heart attack Father   . Stomach cancer Father   . Hypertension Mother   . Breast cancer Mother     Social History Social History  Substance Use Topics  . Smoking status: Never Smoker  . Smokeless tobacco: Never Used  . Alcohol use No     Comment: rare    Review of Systems Constitutional: Negative for fever. Eyes: Negative for visual changes. ENT: Negative for congestion Cardiovascular: Left-sided chest pain. Respiratory: Positive for shortness of breath. Gastrointestinal:  Negative for abdominal pain. Positive for nausea with several episodes of vomiting this morning. Negative for diarrhea. Genitourinary: Negative for dysuria. Musculoskeletal: Negative for back pain. Skin: Negative for rash. Neurological: Negative for headache All other ROS negative  ____________________________________________   PHYSICAL EXAM:  VITAL SIGNS: ED Triage Vitals  Enc Vitals Group     BP 06/08/16 1146 (!) 148/68     Pulse Rate 06/08/16 1146 84     Resp 06/08/16 1146 16     Temp 06/08/16 1146 98.3 F (36.8 C)     Temp Source 06/08/16 1146 Oral     SpO2 06/08/16 1146 97 %     Weight 06/08/16 1147 (!) 332 lb (150.6 kg)     Height 06/08/16 1147 6\' 2"  (1.88 m)     Head Circumference --      Peak Flow --      Pain Score 06/08/16 1146 7     Pain Loc --  Pain Edu? --      Excl. in [place]? --     Constitutional: Alert and oriented. Well appearing and in no distress. Eyes: Normal exam ENT   Head: Normocephalic and atraumatic   Mouth/Throat: Mucous membranes are moist. Cardiovascular: Normal rate, regular rhythm. No murmur Respiratory: Normal respiratory effort without tachypnea nor retractions. Breath sounds are clear Gastrointestinal: Soft and nontender. No distention.  Musculoskeletal: Nontender with normal range of motion in all extremities. No lower extremity tenderness or edema. Neurologic:  Normal speech and language. No gross focal neurologic deficits Skin:  Skin is warm, dry and intact.  Psychiatric: Mood and affect are normal.   ____________________________________________    EKG  EKG reviewed and interpreted by myself shows normal sinus rhythm at 85 bpm, narrow QRS, normal axis, normal intervals, nonspecific but no concerning ST changes.  ____________________________________________    RADIOLOGY  Chest x-ray negative for acute disease.  ____________________________________________   INITIAL IMPRESSION / ASSESSMENT AND PLAN / ED  COURSE  Pertinent labs & imaging results that were available during my care of the patient were reviewed by me and considered in my medical decision making (see chart for details).  Patient presents to the emergency department with chest pain, nausea, vomiting, diaphoresis and shortness of breath beginning at 10:00 this morning. States the chest pain is still present but much improved. Has a history of a myocardial infarction in the past, states he has not had chest pain since that time. Patient's labs including troponin are normal currently. Chest x-ray is negative. EKG shows no acute concerning findings. We will admit the patient to the hospitalist service given the patient's significant comorbidities and concerning story for ACS. We will dose aspirin, morphine for pain control. We'll continue to close to monitor.  ____________________________________________   FINAL CLINICAL IMPRESSION(S) / ED DIAGNOSES  Chest pain    Harvest Dark, MD 06/08/16 1321

## 2016-06-08 NOTE — ED Triage Notes (Signed)
C/O feeling a dull pain in chest, nausea, vomited x 1, and cold sweats.  Onset of symptoms this morning at around 0900-1000.

## 2016-06-09 ENCOUNTER — Encounter: Payer: Self-pay | Admitting: Radiology

## 2016-06-09 ENCOUNTER — Observation Stay (HOSPITAL_BASED_OUTPATIENT_CLINIC_OR_DEPARTMENT_OTHER): Payer: 59

## 2016-06-09 DIAGNOSIS — I429 Cardiomyopathy, unspecified: Secondary | ICD-10-CM | POA: Diagnosis not present

## 2016-06-09 DIAGNOSIS — I10 Essential (primary) hypertension: Secondary | ICD-10-CM | POA: Diagnosis not present

## 2016-06-09 DIAGNOSIS — R079 Chest pain, unspecified: Secondary | ICD-10-CM | POA: Diagnosis not present

## 2016-06-09 LAB — GLUCOSE, CAPILLARY
GLUCOSE-CAPILLARY: 158 mg/dL — AB (ref 65–99)
GLUCOSE-CAPILLARY: 207 mg/dL — AB (ref 65–99)
Glucose-Capillary: 154 mg/dL — ABNORMAL HIGH (ref 65–99)
Glucose-Capillary: 165 mg/dL — ABNORMAL HIGH (ref 65–99)

## 2016-06-09 LAB — TROPONIN I: Troponin I: <0.03 ng/mL (ref <0.03)

## 2016-06-09 MED ORDER — TECHNETIUM TC 99M TETROFOSMIN IV KIT
30.0110 | PACK | Freq: Once | INTRAVENOUS | Status: AC | PRN
  Administered 2016-06-09: 30.011 via INTRAVENOUS

## 2016-06-09 NOTE — Plan of Care (Signed)
Problem: Pain Managment: Goal: General experience of comfort will improve Outcome: Not Progressing Patient continues to have ongoing chest pain with minimal relief.

## 2016-06-09 NOTE — Progress Notes (Signed)
[place] at [facility]                                                                                                                                                                                  Patient Demographics   Gerald [name], is a 51 y.o. male, DOB - [birth year], VOH:[id number]  Admit date - 06/08/2016   Admitting Physician Baxter Hire, MD  Outpatient Primary MD for the patient is Ronnell Freshwater, NP   LOS - 0  Subjective: Patient still complains of chest pain.     Re also complains of some shortness of breathview of Systems:   CONSTITUTIONAL: No documented fever. No fatigue, weakness. No weight gain, no weight loss.  EYES: No blurry or double vision.  ENT: No tinnitus. No postnasal drip. No redness of the oropharynx.  RESPIRATORY: No cough, no wheeze, no hemoptysis. No dyspnea.  CARDIOVASCULAR: +chest pain. No orthopnea. No palpitations. No syncope.  GASTROINTESTINAL: No nausea, no vomiting or diarrhea. No abdominal pain. No melena or hematochezia.  GENITOURINARY: No dysuria or hematuria.  ENDOCRINE: No polyuria or nocturia. No heat or cold intolerance.  HEMATOLOGY: No anemia. No bruising. No bleeding.  INTEGUMENTARY: No rashes. No lesions.  MUSCULOSKELETAL: No arthritis. No swelling. No gout.  NEUROLOGIC: No numbness, tingling, or ataxia. No seizure-type activity.  PSYCHIATRIC: No anxiety. No insomnia. No ADD.    Vitals:   Vitals:   06/08/16 1942 06/09/16 0441 06/09/16 0741 06/09/16 1346  BP: (!) 103/49 118/67 113/69 117/69  Pulse: 71 70 61 72  Resp:  18 18 16   Temp: 98.3 F (36.8 C) 97.6 F (36.4 C) 97.5 F (36.4 C) 97.9 F (36.6 C)  TempSrc: Oral Oral Oral Oral  SpO2: 97% 95% 96% 92%  Weight:      Height:        Wt Readings from Last 3 Encounters:  06/08/16 (!) 330 lb (149.7 kg)  12/01/15 (!) 330 lb (149.7 kg)  10/17/15 (!) 339 lb (153.8 kg)     Intake/Output Summary (Last 24 hours) at 06/09/16 1556 Last data  filed at 06/09/16 1500  Gross per 24 hour  Intake              480 ml  Output             1250 ml  Net             -770 ml    Physical Exam:   GENERAL: Pleasant-appearing in no apparent distress.  HEAD, EYES, EARS, NOSE AND THROAT: Atraumatic, normocephalic. Extraocular muscles are intact. Pupils equal and reactive to light. Sclerae anicteric. No conjunctival injection. No oro-pharyngeal erythema.  NECK: Supple. There is no jugular venous distention. No bruits, no lymphadenopathy, no thyromegaly.  HEART: Regular rate and rhythm,. No murmurs, no rubs, no clicks.  LUNGS: Clear to auscultation bilaterally. No rales or rhonchi. No wheezes.  ABDOMEN: Soft, flat, nontender, nondistended. Has good bowel sounds. No hepatosplenomegaly appreciated.  EXTREMITIES: No evidence of any cyanosis, clubbing, or peripheral edema.  +2 pedal and radial pulses bilaterally.  NEUROLOGIC: The patient is alert, awake, and oriented x3 with no focal motor or sensory deficits appreciated bilaterally.  SKIN: Moist and warm with no rashes appreciated.  Psych: Not anxious, depressed LN: No inguinal LN enlargement    Antibiotics   Anti-infectives    None      Medications   Scheduled Meds: . aspirin EC  81 mg Oral Daily  . cholecalciferol  5,000 Units Oral Daily  . citalopram  20 mg Oral Daily  . [START ON 06/10/2016] Dulaglutide  1.5 mg Subcutaneous Weekly  . enoxaparin (LOVENOX) injection  40 mg Subcutaneous Q12H  . lisinopril  20 mg Oral Daily   And  . hydrochlorothiazide  25 mg Oral Daily  . insulin aspart  0-9 Units Subcutaneous TID WC  . insulin detemir  26 Units Subcutaneous q morning - 10a  . metoprolol succinate  25 mg Oral Daily  . nitroGLYCERIN  0.2 mg Transdermal Daily  . pantoprazole  40 mg Oral Daily  . sodium chloride flush  3 mL Intravenous Q12H   Continuous Infusions: . sodium chloride     PRN Meds:.sodium chloride, albuterol, morphine injection, nitroGLYCERIN, ondansetron, sodium  chloride flush   Data Review:   Micro Results No results found for this or any previous visit (from the past 240 hour(s)).  Radiology Reports Dg Chest 2 View  Result Date: 06/08/2016 CLINICAL DATA:  Acute chest pain. EXAM: CHEST  2 VIEW COMPARISON:  02/28/2016 and prior exam FINDINGS: Cardiomegaly identified. There is no evidence of focal airspace disease, pulmonary edema, suspicious pulmonary nodule/mass, pleural effusion, or pneumothorax. No acute bony abnormalities are identified. IMPRESSION: Cardiomegaly without evidence of acute cardiopulmonary disease. Electronically Signed   By: Margarette Canada M.D.   On: 06/08/2016 12:41     CBC  Recent Labs Lab 06/08/16 1148  WBC 8.3  HGB 14.4  HCT 43.4  PLT 241  MCV 86.5  MCH 28.7  MCHC 33.2  RDW 14.4    Chemistries   Recent Labs Lab 06/08/16 1148  NA 137  K 3.8  CL 101  CO2 27  GLUCOSE 204*  BUN 11  CREATININE 0.79  CALCIUM 9.5   ------------------------------------------------------------------------------------------------------------------ estimated creatinine clearance is 172.5 mL/min (by C-G formula based on SCr of 0.79 mg/dL). ------------------------------------------------------------------------------------------------------------------ No results for input(s): HGBA1C in the last 72 hours. ------------------------------------------------------------------------------------------------------------------ No results for input(s): CHOL, HDL, LDLCALC, TRIG, CHOLHDL, LDLDIRECT in the last 72 hours. ------------------------------------------------------------------------------------------------------------------ No results for input(s): TSH, T4TOTAL, T3FREE, THYROIDAB in the last 72 hours.  Invalid input(s): FREET3 ------------------------------------------------------------------------------------------------------------------ No results for input(s): VITAMINB12, FOLATE, FERRITIN, TIBC, IRON, RETICCTPCT in the last 72  hours.  Coagulation profile No results for input(s): INR, PROTIME in the last 168 hours.  No results for input(s): DDIMER in the last 72 hours.  Cardiac Enzymes  Recent Labs Lab 06/08/16 1546 06/08/16 2107 06/09/16 0319  TROPONINI <0.03 <0.03 <0.03   ------------------------------------------------------------------------------------------------------------------ Invalid input(s): POCBNP    Assessment & Plan   1. Chest pain. With his history of nonobstructive coronary artery disease a Cardiac enzymes are negative. Due to his weight he has a  2 day stress tests and will await results.   continue aspirin therapy and beta blocker  2. Nonobstructive coronary artery disease. Continue aspirias well as ACE inhibitor. We'll restart his beta blocker that he hasn't been taking. Cardiac catheter in 2013 showed a 30% LAD lesion.  3. Nonobstructive cardiomyopathy. His cardiac catheter in 2013 showed an EF around 35-40%. However on echocardiogram done a year later was read as an EF 50%. He is on ACE inhibitor and we are restarting a beta blocker. The stress test should give [place] another indication of his EF. Can adjust treatment from that point.  4. Hypertension. Continue his current medications blood pressure is stable     Code Status Orders        Start     Ordered   06/08/16 1522  Full code  Continuous     06/08/16 1521    Code Status History    Date Active Date Inactive Code Status Order ID Comments User Context   This patient has a current code status but no historical code status.           Consul None  DVT Prophylaxis  Lovenox  Lab Results  Component Value Date   PLT 241 06/08/2016     Time Spent in minutes   80min  Greater than 50% of time spent in care coordination and counseling patient regarding the condition and plan of care.   Dustin Flock M.D on 06/09/2016 at 3:56 PM  Between 7am to 6pm - Pager - [phone]  After 6pm go to [email or web address] -  password [email or web address] [facility]   Office  [phone]

## 2016-06-09 NOTE — Progress Notes (Signed)
   06/09/16 2300  Clinical Encounter Type  Visited With Patient  Visit Type Initial;Psychological support;Spiritual support;Social support  Referral From Nurse  Spiritual Encounters  Spiritual Needs Emotional;Grief support;Prayer  Stress Factors  Patient Stress Factors Major life changes;Loss of control;Health changes  [place] paged to come offer prayer with patient; [place] sat at bedside with patient and talked about life stressors and identified some focus areas and reflect; [place] offered spiritual, social and emotional support along with prayer. Candis Schatz Kimori Tartaglia 12:00 AM

## 2016-06-10 ENCOUNTER — Encounter: Payer: Self-pay | Admitting: Internal Medicine

## 2016-06-10 DIAGNOSIS — I429 Cardiomyopathy, unspecified: Secondary | ICD-10-CM | POA: Diagnosis not present

## 2016-06-10 DIAGNOSIS — I200 Unstable angina: Secondary | ICD-10-CM | POA: Diagnosis not present

## 2016-06-10 DIAGNOSIS — R9439 Abnormal result of other cardiovascular function study: Secondary | ICD-10-CM

## 2016-06-10 DIAGNOSIS — I10 Essential (primary) hypertension: Secondary | ICD-10-CM

## 2016-06-10 DIAGNOSIS — R079 Chest pain, unspecified: Secondary | ICD-10-CM | POA: Insufficient documentation

## 2016-06-10 DIAGNOSIS — E782 Mixed hyperlipidemia: Secondary | ICD-10-CM | POA: Diagnosis not present

## 2016-06-10 LAB — GLUCOSE, CAPILLARY
Glucose-Capillary: 170 mg/dL — ABNORMAL HIGH (ref 65–99)
Glucose-Capillary: 178 mg/dL — ABNORMAL HIGH (ref 65–99)
Glucose-Capillary: 144 mg/dL — ABNORMAL HIGH (ref 65–99)
Glucose-Capillary: 211 mg/dL — ABNORMAL HIGH (ref 65–99)

## 2016-06-10 LAB — NM MYOCAR MULTI W/SPECT W/WALL MOTION / EF
CHL CUP RESTING HR STRESS: 67 {beats}/min
LV dias vol: 136 mL (ref 62–150)
LV sys vol: 83 mL
NUC STRESS TID: 1.06
Peak HR: 94 {beats}/min

## 2016-06-10 MED ORDER — SODIUM CHLORIDE 0.9 % IV SOLN: 250.0000 mL | INTRAVENOUS | Status: DC | PRN

## 2016-06-10 MED ORDER — ASPIRIN 81 MG PO CHEW
81.0000 mg | CHEWABLE_TABLET | ORAL | Status: AC
  Administered 2016-06-11: 81 mg via ORAL
  Filled 2016-06-10: qty 1

## 2016-06-10 MED ORDER — SODIUM CHLORIDE 0.9% FLUSH: 3.0000 mL | INTRAVENOUS | Status: DC | PRN

## 2016-06-10 MED ORDER — TECHNETIUM TC 99M TETROFOSMIN IV KIT
31.5100 | PACK | Freq: Once | INTRAVENOUS | Status: AC | PRN
  Administered 2016-06-10: 31.51 via INTRAVENOUS

## 2016-06-10 MED ORDER — DULAGLUTIDE 1.5 MG/0.5ML ~~LOC~~ SOAJ: 1.5000 mg | SUBCUTANEOUS | Status: DC

## 2016-06-10 MED ORDER — SODIUM CHLORIDE 0.9 % IV SOLN
INTRAVENOUS | Status: DC
  Administered 2016-06-11: 04:00:00 via INTRAVENOUS

## 2016-06-10 MED ORDER — SODIUM CHLORIDE 0.9% FLUSH
3.0000 mL | Freq: Two times a day (BID) | INTRAVENOUS | Status: DC
  Administered 2016-06-10: 3 mL via INTRAVENOUS

## 2016-06-10 NOTE — Consult Note (Signed)
Cardiology Consultation Note    Patient ID: Gerald [name], MRN: [id number], DOB/AGE: 02/09/1965 51 y.o. Admit date: 06/08/2016   Date of Consult: 06/10/2016 Primary Physician: Ronnell Freshwater, NP Primary Cardiologist: Fletcher Anon - Last seen in 2014  Chief Complaint: Chest pain Reason for Consultation: Unstable angina and abnormal stress test Requesting MD: Dustin Flock, MD  HPI: Gerald [name] is a 51 y.o. male who is being seen today for the evaluation of chest pain and abnormal stress test at the request of Dr. Posey Pronto.Patient has a history of nonobstructive CAD with 30% mid LAD stenosis by catheterization in 2016 (stable from 2013), nonischemic cardiomyopathy with normalization of LV function, type 2 diabetes mellitus, hypertension, hyperlipidemia, obstructive sleep apnea on CPAP, and morbid obesity. He reports exertional chest pain over the last several weeks that he describes as heaviness. However, 3 days ago, the pain occurred at rest and has been persistent but worsened with activity. The patient first experienced nausea and emesis, after which the chest pain started. He has continued to feel intermittently nauseated. He also has shortness of breath and orthopnea despite using CPAP on a nightly basis. About 1.5 weeks ago, he had lower extremity edema, right greater than left, for unclear reasons. However, this spontaneously returned back to normal. He has not had any significant edema over the last few days. He notes occasional palpitations but no lightheadedness or syncope.  [name] was admitted to the hospitalist service and was found to have troponin I 3 less than 0.03. He underwent myocardial perfusion stress test (2 day study), which revealed moderately reduced LV function with global hypokinesis as well as concern for anterior and anterolateral ischemia/scar versus artifact. He notes that this chest pain is currently still present but has improved considerably with the addition of  nitroglycerin patch.   Past Medical History:  Diagnosis Date  . Abnormal LFTs   . Coronary artery disease    Mild nonobstructive coronary artery disease on previous cardiac catheterization most recently in August of 2013  . Diabetes mellitus without complication (HCC)    borderline  . Fundic gland polyps of stomach, benign   . Gastritis   . GERD (gastroesophageal reflux disease)   . Hepatic steatosis   . Hyperlipidemia   . Hypertension   . Myocardial infarction ([place]) 09/2011  . Nonischemic cardiomyopathy (HCC)    Previous ejection fraction of 35-40% on echo. 50% on cardiac catheterization in August of 2013  . Obesity   . Sleep apnea       Surgical History:  Past Surgical History:  Procedure Laterality Date  . CARDIAC CATHETERIZATION N/A 09/2011   [facility]; EF 50% with 30% mid LAD stenosis and no obstructive disease.  Marland Kitchen CARDIAC CATHETERIZATION  09/2010   [facility]; Mid LAD 40% stenosis; Mid Circumflex:Normal; Mid RCA; Normal  . CARDIAC CATHETERIZATION    . COLONOSCOPY    . KNEE ARTHROSCOPY WITH MEDIAL MENISECTOMY Right 09/16/2012   Procedure: RIGHT KNEE ARTHROSCOPY WITH MEDIAL AND LATERAL MENISECTOMY, CHONDROPLASTY;  Surgeon: Ninetta Lights, MD;  Location: [place];  Service: Orthopedics;  Laterality: Right;  RIGHT KNEE SCOPE MEDIAL MENISCECTOMY     Home Meds: Prior to Admission medications   Medication Sig Start Date Annasophia Crocker Date Taking? Authorizing Provider  albuterol (PROVENTIL HFA) 108 (90 BASE) MCG/ACT inhaler Inhale 2 puffs into the lungs every 4 (four) hours as needed for wheezing or shortness of breath. 12/30/14  Yes Carrie Mew, MD  aspirin EC 81 MG tablet Take  81 mg by mouth daily.   Yes [provider]  Cholecalciferol (VITAMIN D3) 5000 UNITS CAPS Take 5,000 Units by mouth daily.   Yes [provider]  citalopram (CELEXA) 20 MG tablet Take 1 tablet (20 mg total) by mouth daily. 01/31/14  Yes Baity, Coralie Keens, NP  Dulaglutide (TRULICITY) 1.5  QJ/1.9ER SOPN Inject 1.5 mg into the skin once a week.   Yes [provider]  insulin detemir (LEVEMIR) 100 UNIT/ML injection Inject 26 Units into the skin every morning.   Yes [provider]  lisinopril-hydrochlorothiazide (PRINZIDE,ZESTORETIC) 20-25 MG tablet Take 1 tablet by mouth daily. 05/25/16  Yes [provider]  omeprazole (PRILOSEC) 40 MG capsule TAKE 1 CAPSULE (40 MG TOTAL) BY MOUTH DAILY. 04/29/16  Yes Irene Shipper, MD  ondansetron (ZOFRAN ODT) 4 MG disintegrating tablet Take 1 tablet (4 mg total) by mouth every 8 (eight) hours as needed for nausea or vomiting. 12/01/15  Yes Alfred Levins, [place], MD  azithromycin (ZITHROMAX Z-PAK) 250 MG tablet Take 2 tablets (500 mg) on  Day 1,  followed by 1 tablet (250 mg) once daily on Days 2 through 5. Patient not taking: Reported on 10/18/2015 06/12/15   [name], MD  chlorpheniramine-HYDROcodone [facility] PENNKINETIC ER) 10-8 MG/5ML SUER Take 5 mLs by mouth 2 (two) times daily. Patient not taking: Reported on 10/18/2015 06/12/15   [name], MD  furosemide (LASIX) 20 MG tablet Take 1 tablet (20 mg total) by mouth daily. Patient not taking: Reported on 06/08/2016 11/08/13   Lucille Passy, MD  metoprolol succinate (TOPROL-XL) 25 MG 24 hr tablet Take 1 tablet (25 mg total) by mouth daily. Patient not taking: Reported on 06/08/2016 11/08/13   Lucille Passy, MD  nitroGLYCERIN (NITROSTAT) 0.4 MG SL tablet Place 1 tablet (0.4 mg total) under the tongue every 5 (five) minutes as needed for chest pain. Patient not taking: Reported on 06/08/2016 11/08/13   Lucille Passy, MD  potassium chloride SA (K-DUR,KLOR-CON) 20 MEQ tablet Take 1 tablet (20 mEq total) by mouth daily. Patient not taking: Reported on 10/18/2015 11/08/13   Lucille Passy, MD  prochlorperazine (COMPAZINE) 10 MG tablet Take 1 tablet (10 mg total) by mouth every 6 (six) hours as needed for nausea. Patient not taking: Reported on 06/08/2016 10/18/15   Paulette Blanch,  MD    Inpatient Medications:  . aspirin EC  81 mg Oral Daily  . cholecalciferol  5,000 Units Oral Daily  . citalopram  20 mg Oral Daily  . Dulaglutide  1.5 mg Subcutaneous Weekly  . enoxaparin (LOVENOX) injection  40 mg Subcutaneous Q12H  . lisinopril  20 mg Oral Daily   And  . hydrochlorothiazide  25 mg Oral Daily  . insulin aspart  0-9 Units Subcutaneous TID WC  . insulin detemir  26 Units Subcutaneous q morning - 10a  . metoprolol succinate  25 mg Oral Daily  . nitroGLYCERIN  0.2 mg Transdermal Daily  . pantoprazole  40 mg Oral Daily  . sodium chloride flush  3 mL Intravenous Q12H   . sodium chloride      Allergies:  Allergies  Allergen Reactions  . Statins Rash         Social History   Social History  . Marital status: Married    Spouse name: N/A  . Number of children: 3  . Years of education: N/A   Occupational History  . 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  Smoking status: Never Smoker  . Smokeless tobacco: Never Used  . Alcohol use No     Comment: rare  . Drug use: No  . Sexual activity: Not on file   Other Topics Concern  . Not on file   Social History Narrative   Married.  80 yo son.   Wife on disability for bipolar disorder.       Family History  Problem Relation Age of Onset  . Heart disease Father 74    MI  . Heart attack Father   . Stomach cancer Father   . Hypertension Mother   . Breast cancer Mother      Review of Systems: A 12-system review of systems was performed and is negative except as noted in the HPI.  Labs:  Recent Labs  06/08/16 1148 06/08/16 1546 06/08/16 2107 06/09/16 0319  TROPONINI <0.03 <0.03 <0.03 <0.03   Lab Results  Component Value Date   WBC 8.3 06/08/2016   HGB 14.4 06/08/2016   HCT 43.4 06/08/2016   MCV 86.5 06/08/2016   PLT 241 06/08/2016    Recent Labs Lab 06/08/16 1148  NA 137  K 3.8  CL 101  CO2 27  BUN 11  CREATININE 0.79  CALCIUM 9.5  GLUCOSE 204*   Lab Results    Component Value Date   CHOL 212 (H) 11/08/2013   HDL 32.40 (L) 11/08/2013   LDLCALC 132 (H) 05/20/2012   TRIG 201.0 (H) 11/08/2013   Lab Results  Component Value Date   DDIMER 0.27 06/03/2012    Radiology/Studies:  Dg Chest 2 View  Result Date: 06/08/2016 CLINICAL DATA:  Acute chest pain. EXAM: CHEST  2 VIEW COMPARISON:  02/28/2016 and prior exam FINDINGS: Cardiomegaly identified. There is no evidence of focal airspace disease, pulmonary edema, suspicious pulmonary nodule/mass, pleural effusion, or pneumothorax. No acute bony abnormalities are identified. IMPRESSION: Cardiomegaly without evidence of acute cardiopulmonary disease. Electronically Signed   By: Margarette Canada M.D.   On: 06/08/2016 12:41    Wt Readings from Last 3 Encounters:  06/08/16 (!) 330 lb (149.7 kg)  12/01/15 (!) 330 lb (149.7 kg)  10/17/15 (!) 339 lb (153.8 kg)    EKG: Tracing from 06/08/16 at 11:30 6 AM demonstrates normal sinus rhythm without significant abnormalities.  Physical Exam: Blood pressure 124/69, pulse 73, temperature 98.4 F (36.9 C), temperature source Oral, resp. rate 18, height 6\' 2"  (1.88 m), weight (!) 330 lb (149.7 kg), SpO2 92 %. Body mass index is 42.37 kg/m. General: Morbidly obese man, seated in a recliner Head: Normocephalic, atraumatic, sclera non-icteric, no xanthomas, nares are without discharge.  Neck: Negative for carotid bruits. JVD not elevated, though evaluation is limited by body habitus. Lungs: Mildly diminished breath sounds throughout without wheezes or crackles. Normal work of breathing. Heart: RRR with S1 S2. No murmurs, rubs, or gallops appreciated. Abdomen: Obese, soft, non-tender, non-distended with normoactive bowel sounds. No hepatomegaly. No rebound/guarding. No obvious abdominal masses. Msk:  Strength and tone appear normal for age. Extremities: No clubbing or cyanosis. Trace pretibial edema bilaterally.  Distal pedal pulses are 2+ and equal bilaterally. Neuro: Alert  and oriented X 3. No facial asymmetry. No focal deficit. Moves all extremities spontaneously. Psych:  Responds to questions appropriately with a normal affect.   Assessment and Plan  51 year old man with nonobstructive CAD, NICM with prior normalization of EF, hypertension, hyperlipidemia, diabetes mellitus, and sleep apnea, admitted with worsening chest pain now present at rest, concerning for unstable angina.  Unstable  angina Chest pain has both typical and atypical features. He has had some exertional discomfort over the preceding few weeks. Interestingly, however, current episode of chest pain began after the episode of emesis and has been persistent. This suggests a possible concurrent GI pathology. My cardial perfusion stress test was abnormal, though limited by motion and attenuation artifact.  Given ongoing chest pain and abnormal stress test with possible ischemia in the same distribution where his previous mid LAD stenosis was found, I think it would be prudent to proceed with coronary angiography. I have reviewed the risks, indications, and alternatives to cardiac catheterization, possible angioplasty, and stenting with the patient. Risks include but are not limited to bleeding, infection, vascular injury, stroke, myocardial infection, arrhythmia, kidney injury, radiation-related injury in the case of prolonged fluoroscopy use, emergency cardiac surgery, and death. The patient understands the risks of serious complication is 1-2 in 3151 with diagnostic cardiac cath and 1-2% or less with angioplasty/stenting. Patient will be made nothing by mouth after midnight tonight.  Continue nitroglycerin patch, aspirin, and metoprolol.  Continue pantoprazole for possible GI component.  Cardiomyopathy LVEF moderately diminished on nuclear stress test. Mr. Summerlin has a history of systolic heart failure in the past, though his LVEF had normalized in 2016.  Continue metoprolol and lisinopril.  Defer  diuresis at this time pending cardiac catheterization tomorrow.  Obtain transthoracic echocardiogram to better evaluate LV function.  Hypertension  Blood pressure well-controlled at this time. Continue current medications.  Hyperlipidemia Most recent lipid profile from 2015 is notable for an LDL of 146. Given his comorbidities, including known CAD and diabetes mellitus, he would benefit from lipid lowering therapy.  Given history of statin allergy, patient could be considered for PCSK9 inhibitor in the future. This will need to be addressed as an outpatient.  Verlan Friends Titianna Loomis MD 06/10/2016, 3:07 PM Pager: [phone]

## 2016-06-10 NOTE — Progress Notes (Signed)
[place] at [facility]                                                                                                                                                                                  Patient Demographics   Gerald [name], is a 51 y.o. male, DOB - 1965-04-20, QAS:[id number]  Admit date - 06/08/2016   Admitting Physician Baxter Hire, MD  Outpatient Primary MD for the patient is Ronnell Freshwater, NP   LOS - 0  Subjective: Patient continues to have dull ache. He finished his stress test there was an abnormality on the stress test noted    Re also complains of some shortness of breathview of Systems:   CONSTITUTIONAL: No documented fever. No fatigue, weakness. No weight gain, no weight loss.  EYES: No blurry or double vision.  ENT: No tinnitus. No postnasal drip. No redness of the oropharynx.  RESPIRATORY: No cough, no wheeze, no hemoptysis. No dyspnea.  CARDIOVASCULAR: +chest pain. No orthopnea. No palpitations. No syncope.  GASTROINTESTINAL: No nausea, no vomiting or diarrhea. No abdominal pain. No melena or hematochezia.  GENITOURINARY: No dysuria or hematuria.  ENDOCRINE: No polyuria or nocturia. No heat or cold intolerance.  HEMATOLOGY: No anemia. No bruising. No bleeding.  INTEGUMENTARY: No rashes. No lesions.  MUSCULOSKELETAL: No arthritis. No swelling. No gout.  NEUROLOGIC: No numbness, tingling, or ataxia. No seizure-type activity.  PSYCHIATRIC: No anxiety. No insomnia. No ADD.    Vitals:   Vitals:   06/10/16 0551 06/10/16 0553 06/10/16 0821 06/10/16 1146  BP: (!) 68/26 121/68 115/64 124/69  Pulse: 60 62 67 73  Resp: 18  18 18   Temp: 97.7 F (36.5 C)  97.9 F (36.6 C) 98.4 F (36.9 C)  TempSrc: Oral  Oral Oral  SpO2: 95% 96% 93% 92%  Weight:      Height:        Wt Readings from Last 3 Encounters:  06/08/16 (!) 330 lb (149.7 kg)  12/01/15 (!) 330 lb (149.7 kg)  10/17/15 (!) 339 lb (153.8 kg)      Intake/Output Summary (Last 24 hours) at 06/10/16 1621 Last data filed at 06/10/16 1400  Gross per 24 hour  Intake              240 ml  Output             1200 ml  Net             -960 ml    Physical Exam:   GENERAL: Pleasant-appearing in no apparent distress.  HEAD, EYES, EARS, NOSE AND THROAT: Atraumatic, normocephalic. Extraocular muscles are intact. Pupils equal and reactive to  light. Sclerae anicteric. No conjunctival injection. No oro-pharyngeal erythema.  NECK: Supple. There is no jugular venous distention. No bruits, no lymphadenopathy, no thyromegaly.  HEART: Regular rate and rhythm,. No murmurs, no rubs, no clicks.  LUNGS: Clear to auscultation bilaterally. No rales or rhonchi. No wheezes.  ABDOMEN: Soft, flat, nontender, nondistended. Has good bowel sounds. No hepatosplenomegaly appreciated.  EXTREMITIES: No evidence of any cyanosis, clubbing, or peripheral edema.  +2 pedal and radial pulses bilaterally.  NEUROLOGIC: The patient is alert, awake, and oriented x3 with no focal motor or sensory deficits appreciated bilaterally.  SKIN: Moist and warm with no rashes appreciated.  Psych: Not anxious, depressed LN: No inguinal LN enlargement    Antibiotics   Anti-infectives    None      Medications   Scheduled Meds: . aspirin EC  81 mg Oral Daily  . cholecalciferol  5,000 Units Oral Daily  . citalopram  20 mg Oral Daily  . Dulaglutide  1.5 mg Subcutaneous Weekly  . enoxaparin (LOVENOX) injection  40 mg Subcutaneous Q12H  . lisinopril  20 mg Oral Daily   And  . hydrochlorothiazide  25 mg Oral Daily  . insulin aspart  0-9 Units Subcutaneous TID WC  . insulin detemir  26 Units Subcutaneous q morning - 10a  . metoprolol succinate  25 mg Oral Daily  . nitroGLYCERIN  0.2 mg Transdermal Daily  . pantoprazole  40 mg Oral Daily  . sodium chloride flush  3 mL Intravenous Q12H   Continuous Infusions: . sodium chloride     PRN Meds:.sodium chloride, albuterol,  morphine injection, nitroGLYCERIN, ondansetron, sodium chloride flush   Data Review:   Micro Results No results found for this or any previous visit (from the past 240 hour(s)).  Radiology Reports Dg Chest 2 View  Result Date: 06/08/2016 CLINICAL DATA:  Acute chest pain. EXAM: CHEST  2 VIEW COMPARISON:  02/28/2016 and prior exam FINDINGS: Cardiomegaly identified. There is no evidence of focal airspace disease, pulmonary edema, suspicious pulmonary nodule/mass, pleural effusion, or pneumothorax. No acute bony abnormalities are identified. IMPRESSION: Cardiomegaly without evidence of acute cardiopulmonary disease. Electronically Signed   By: Margarette Canada M.D.   On: 06/08/2016 12:41   Nm Myocar Multi W/spect W/wall Motion / Ef  Result Date: 06/10/2016  Abnormal, potentially high-risk study.  There is a large in size, mild in severity, partially reversible defect involving the anterior and anterolateral walls. While this could represent artifact (motion and attenuation), ischemia and an element of scar cannot be excluded.  The left ventricular ejection fraction is moderately decreased (39%).      CBC  Recent Labs Lab 06/08/16 1148  WBC 8.3  HGB 14.4  HCT 43.4  PLT 241  MCV 86.5  MCH 28.7  MCHC 33.2  RDW 14.4    Chemistries   Recent Labs Lab 06/08/16 1148  NA 137  K 3.8  CL 101  CO2 27  GLUCOSE 204*  BUN 11  CREATININE 0.79  CALCIUM 9.5   ------------------------------------------------------------------------------------------------------------------ estimated creatinine clearance is 172.5 mL/min (by C-G formula based on SCr of 0.79 mg/dL). ------------------------------------------------------------------------------------------------------------------ No results for input(s): HGBA1C in the last 72 hours. ------------------------------------------------------------------------------------------------------------------ No results for input(s): CHOL, HDL, LDLCALC, TRIG,  CHOLHDL, LDLDIRECT in the last 72 hours. ------------------------------------------------------------------------------------------------------------------ No results for input(s): TSH, T4TOTAL, T3FREE, THYROIDAB in the last 72 hours.  Invalid input(s): FREET3 ------------------------------------------------------------------------------------------------------------------ No results for input(s): VITAMINB12, FOLATE, FERRITIN, TIBC, IRON, RETICCTPCT in the last 72 hours.  Coagulation profile No results for input(s):  INR, PROTIME in the last 168 hours.  No results for input(s): DDIMER in the last 72 hours.  Cardiac Enzymes  Recent Labs Lab 06/08/16 1546 06/08/16 2107 06/09/16 0319  TROPONINI <0.03 <0.03 <0.03   ------------------------------------------------------------------------------------------------------------------ Invalid input(s): POCBNP    Assessment & Plan   1. Chest pain. With his history of nonobstructive coronary artery disease  Cardiac enzymes are negative. continue aspirin therapy and beta blocker Patient's stress test is abnormal cardiology will be seeing the patient  2. Nonobstructive coronary artery disease. Continue aspirias well as ACE inhibitor. continue beta blocker    3. Nonobstructive cardiomyopathy. His cardiac catheter in 2013 showed an EF around 35-40%.  Continue beta blockers  4. Hypertension. Blood pressure is stable continue lisinopril and HCTZ as well as metoprolol     Code Status Orders        Start     Ordered   06/08/16 1522  Full code  Continuous     06/08/16 1521    Code Status History    Date Active Date Inactive Code Status Order ID Comments User Context   This patient has a current code status but no historical code status.           Consul None  DVT Prophylaxis  Lovenox  Lab Results  Component Value Date   PLT 241 06/08/2016     Time Spent in minutes   75min  Greater than 50% of time spent in care  coordination and counseling patient regarding the condition and plan of care.   Dustin Flock M.D on 06/10/2016 at 4:21 PM  Between 7am to 6pm - Pager - [phone]  After 6pm go to [email or web address] - password [email or web address] [facility]   Office  [phone]

## 2016-06-11 ENCOUNTER — Encounter
Admission: EM | Disposition: A | Discharge status: Home / Self Care | Payer: Self-pay | Source: Home / Self Care | Attending: Emergency Medicine

## 2016-06-11 ENCOUNTER — Observation Stay (HOSPITAL_BASED_OUTPATIENT_CLINIC_OR_DEPARTMENT_OTHER)
Admit: 2016-06-11 | Discharge: 2016-06-11 | Disposition: A | Discharge status: Home / Self Care | Payer: 59 | Attending: Internal Medicine | Admitting: Internal Medicine

## 2016-06-11 ENCOUNTER — Encounter: Payer: Self-pay | Admitting: Cardiovascular Disease

## 2016-06-11 DIAGNOSIS — R079 Chest pain, unspecified: Secondary | ICD-10-CM | POA: Diagnosis not present

## 2016-06-11 DIAGNOSIS — I25110 Atherosclerotic heart disease of native coronary artery with unstable angina pectoris: Secondary | ICD-10-CM

## 2016-06-11 DIAGNOSIS — I10 Essential (primary) hypertension: Secondary | ICD-10-CM | POA: Diagnosis not present

## 2016-06-11 DIAGNOSIS — I2510 Atherosclerotic heart disease of native coronary artery without angina pectoris: Secondary | ICD-10-CM | POA: Diagnosis not present

## 2016-06-11 HISTORY — PX: LEFT HEART CATH AND CORONARY ANGIOGRAPHY: CATH118249

## 2016-06-11 LAB — CBC
HCT: 41.4 % (ref 40.0–52.0)
Hemoglobin: 14.2 g/dL (ref 13.0–18.0)
MCH: 29.4 pg (ref 26.0–34.0)
MCHC: 34.2 g/dL (ref 32.0–36.0)
MCV: 85.9 fL (ref 80.0–100.0)
PLATELETS: 227 10*3/uL (ref 150–440)
RBC: 4.83 MIL/uL (ref 4.40–5.90)
RDW: 14.4 % (ref 11.5–14.5)
WBC: 7.9 10*3/uL (ref 3.8–10.6)

## 2016-06-11 LAB — CREATININE, SERUM
CREATININE: 0.91 mg/dL (ref 0.61–1.24)
GFR calc Af Amer: >60 mL/min (ref >60)
GFR calc non Af Amer: >60 mL/min (ref >60)

## 2016-06-11 LAB — GLUCOSE, CAPILLARY
GLUCOSE-CAPILLARY: 155 mg/dL — AB (ref 65–99)
GLUCOSE-CAPILLARY: 145 mg/dL — AB (ref 65–99)

## 2016-06-11 LAB — ECHOCARDIOGRAM COMPLETE
Height: 74 in
Weight: 5280 oz

## 2016-06-11 LAB — LIPID PANEL
Cholesterol: 194 mg/dL (ref 0–200)
HDL: 35 mg/dL — ABNORMAL LOW (ref >40)
LDL CALC: 123 mg/dL — AB (ref 0–99)
TRIGLYCERIDES: 179 mg/dL — AB (ref <150)
Total CHOL/HDL Ratio: 5.5 RATIO
VLDL: 36 mg/dL (ref 0–40)

## 2016-06-11 LAB — CARDIAC CATHETERIZATION: Cath EF Quantitative: 60 %

## 2016-06-11 SURGERY — LEFT HEART CATH AND CORONARY ANGIOGRAPHY
Anesthesia: Moderate Sedation

## 2016-06-11 MED ORDER — SODIUM CHLORIDE 0.9 % IV SOLN: 250.0000 mL | INTRAVENOUS | Status: DC | PRN

## 2016-06-11 MED ORDER — MIDAZOLAM HCL 2 MG/2ML IJ SOLN
INTRAMUSCULAR | Status: AC
  Filled 2016-06-11: qty 2

## 2016-06-11 MED ORDER — FENTANYL CITRATE (PF) 100 MCG/2ML IJ SOLN
INTRAMUSCULAR | Status: DC | PRN
  Administered 2016-06-11: 50 ug via INTRAVENOUS

## 2016-06-11 MED ORDER — HEPARIN (PORCINE) IN NACL 2-0.9 UNIT/ML-% IJ SOLN
INTRAMUSCULAR | Status: AC
  Filled 2016-06-11: qty 500

## 2016-06-11 MED ORDER — ONDANSETRON HCL 4 MG/2ML IJ SOLN: 4.0000 mg | Freq: Four times a day (QID) | INTRAMUSCULAR | Status: DC | PRN

## 2016-06-11 MED ORDER — IOPAMIDOL (ISOVUE-300) INJECTION 61%
INTRAVENOUS | Status: DC | PRN
  Administered 2016-06-11: 105 mL via INTRAVENOUS

## 2016-06-11 MED ORDER — PRAVASTATIN SODIUM 20 MG PO TABS: 20.0000 mg | ORAL_TABLET | Freq: Every day | ORAL | 1 refills | Status: DC

## 2016-06-11 MED ORDER — SODIUM CHLORIDE 0.9% FLUSH: 3.0000 mL | INTRAVENOUS | Status: DC | PRN

## 2016-06-11 MED ORDER — MIDAZOLAM HCL 2 MG/2ML IJ SOLN
INTRAMUSCULAR | Status: DC | PRN
  Administered 2016-06-11: 1 mg via INTRAVENOUS

## 2016-06-11 MED ORDER — FENTANYL CITRATE (PF) 100 MCG/2ML IJ SOLN
INTRAMUSCULAR | Status: AC
  Filled 2016-06-11: qty 2

## 2016-06-11 MED ORDER — SODIUM CHLORIDE 0.9 % WEIGHT BASED INFUSION
1.0000 mL/kg/h | INTRAVENOUS | Status: DC
  Administered 2016-06-11: 1 mL/kg/h via INTRAVENOUS

## 2016-06-11 MED ORDER — ACETAMINOPHEN 325 MG PO TABS: 650.0000 mg | ORAL_TABLET | ORAL | Status: DC | PRN

## 2016-06-11 MED ORDER — SODIUM CHLORIDE 0.9% FLUSH: 3.0000 mL | Freq: Two times a day (BID) | INTRAVENOUS | Status: DC

## 2016-06-11 SURGICAL SUPPLY — 9 items
CATH 5FR JL4 DIAGNOSTIC (CATHETERS) ×3 IMPLANT
CATH INFINITI 5FR ANG PIGTAIL (CATHETERS) ×3 IMPLANT
CATH INFINITI JR4 5F (CATHETERS) ×3 IMPLANT
DEVICE CLOSURE MYNXGRIP 5F (Vascular Products) ×3 IMPLANT
GUIDEWIRE 3MM J TIP .035 145 (WIRE) ×3 IMPLANT
KIT MANI 3VAL PERCEP (MISCELLANEOUS) ×3 IMPLANT
NEEDLE PERC 18GX7CM (NEEDLE) ×3 IMPLANT
PACK CARDIAC CATH (CUSTOM PROCEDURE TRAY) ×3 IMPLANT
SHEATH AVANTI 5FR X 11CM (SHEATH) ×3 IMPLANT

## 2016-06-11 NOTE — Progress Notes (Signed)
Patient discharged via wheelchair and private vehicle. IV removed and catheter intact. All discharge instructions given and patient verbalizes understanding. Tele removed and returned. No prescriptions given to patient No distress noted.   

## 2016-06-11 NOTE — Progress Notes (Signed)
Patient back from special procedures status post heart cath. Right groin site stable no signs of bleeding or hematoma noted. VSS. Tele box on and verified. Will continue to monitor

## 2016-06-11 NOTE — Progress Notes (Signed)
Gerald [name] was admitted to the Hospital on 06/08/2016 and Discharged  06/11/2016 and should be excused from work/school   for  8  days starting 06/08/2016 , may return to work/school without any restrictions.  Call Gerald Presto MD, [facility]  [phone] with questions.  Henreitta Leber M.D on 06/11/2016,at 2:50 PM

## 2016-06-11 NOTE — Progress Notes (Signed)
*  PRELIMINARY RESULTS* Echocardiogram 2D Echocardiogram has been performed.  Sherrie Sport 06/11/2016, 8:25 AM

## 2016-06-11 NOTE — Progress Notes (Signed)
Cardiac cath Nonobstructive disease 30 to 40% proximal to mid LAD, unchanged from previous study No further cardiac workup needed Chest pain likely noncardiac in nature Continue asa 81 mg daily with aggressive statin therapy, Goal LDL <70  Signed, Esmond Plants, MD, Ph.D [facility]

## 2016-06-11 NOTE — Discharge Instructions (Signed)
Femoral Site Care Refer to this sheet in the next few weeks. These instructions provide you with information about caring for yourself after your procedure. Your health care provider may also give you more specific instructions. Your treatment has been planned according to current medical practices, but problems sometimes occur. Call your health care provider if you have any problems or questions after your procedure. What can I expect after the procedure? After your procedure, it is typical to have the following:  Bruising at the site that usually fades within 1-2 weeks.  Blood collecting in the tissue (hematoma) that may be painful to the touch. It should usually decrease in size and tenderness within 1-2 weeks. Follow these instructions at home:  Take medicines only as directed by your health care provider.  You may shower 24-48 hours after the procedure or as directed by your health care provider. Remove the bandage (dressing) and gently wash the site with plain soap and water. Pat the area dry with a clean towel. Do not rub the site, because this may cause bleeding.  Do not take baths, swim, or use a hot tub until your health care provider approves.  Check your insertion site every day for redness, swelling, or drainage.  Do not apply powder or lotion to the site.  Limit use of stairs to twice a day for the first 2-3 days or as directed by your health care provider.  Do not squat for the first 2-3 days or as directed by your health care provider.  Do not lift over 10 lb (4.5 kg) for 5 days after your procedure or as directed by your health care provider.  Ask your health care provider when it is okay to:  Return to work or school.  Resume usual physical activities or sports.  Resume sexual activity.  Do not drive home if you are discharged the same day as the procedure. Have someone else drive you.  You may drive 24 hours after the procedure unless otherwise instructed by  your health care provider.  Do not operate machinery or power tools for 24 hours after the procedure or as directed by your health care provider.  If your procedure was done as an outpatient procedure, which means that you went home the same day as your procedure, a responsible adult should be with you for the first 24 hours after you arrive home.  Keep all follow-up visits as directed by your health care provider. This is important. Contact a health care provider if:  You have a fever.  You have chills.  You have increased bleeding from the site. Hold pressure on the site. Get help right away if:  You have unusual pain at the site.  You have redness, warmth, or swelling at the site.  You have drainage (other than a small amount of blood on the dressing) from the site.  The site is bleeding, and the bleeding does not stop after 30 minutes of holding steady pressure on the site.  Your leg or foot becomes pale, cool, tingly, or numb. This information is not intended to replace advice given to you by your health care provider. Make sure you discuss any questions you have with your health care provider. Document Released: 09/23/2013 Document Revised: 06/28/2015 Document Reviewed: 08/09/2013 Elsevier Interactive Patient Education  2017 [facility] [employer]:  Cardiac diet  DISCHARGE CONDITION:  Stable  ACTIVITY:  Activity as tolerated  OXYGEN:  Home Oxygen:  No.   Oxygen Delivery: room air  DISCHARGE LOCATION:  home    ADDITIONAL DISCHARGE INSTRUCTION:   If you experience worsening of your admission symptoms, develop shortness of breath, life threatening emergency, suicidal or homicidal thoughts you must seek medical attention immediately by calling 911 or calling your MD immediately  if symptoms less severe.  You Must read complete instructions/literature along with all the possible adverse reactions/side effects for  all the Medicines you take and that have been prescribed to you. Take any new Medicines after you have completely understood and accpet all the possible adverse reactions/side effects.   Please note  You were cared for by a hospitalist during your hospital stay. If you have any questions about your discharge medications or the care you received while you were in the hospital after you are discharged, you can call the unit and asked to speak with the hospitalist on call if the hospitalist that took care of you is not available. Once you are discharged, your primary care physician will handle any further medical issues. Please note that NO REFILLS for any discharge medications will be authorized once you are discharged, as it is imperative that you return to your primary care physician (or establish a relationship with a primary care physician if you do not have one) for your aftercare needs so that they can reassess your need for medications and monitor your lab values.

## 2016-06-11 NOTE — Discharge Summary (Signed)
[place] at [place] NAME: Gerald [name]    MR#:  [id number]  DATE OF BIRTH:  Oct 09, 1965  DATE OF ADMISSION:  06/08/2016 ADMITTING PHYSICIAN: Baxter Hire, MD  DATE OF DISCHARGE: 06/11/2016  PRIMARY CARE PHYSICIAN: Ronnell Freshwater, NP    ADMISSION DIAGNOSIS:  Chest pain, unspecified type [R07.9]  DISCHARGE DIAGNOSIS:  Active Problems:   Chest pain   SECONDARY DIAGNOSIS:   Past Medical History:  Diagnosis Date  . Abnormal LFTs   . Coronary artery disease    Mild nonobstructive coronary artery disease on previous cardiac catheterization most recently in August of 2013  . Diabetes mellitus without complication (HCC)    borderline  . Fundic gland polyps of stomach, benign   . Gastritis   . GERD (gastroesophageal reflux disease)   . Hepatic steatosis   . Hyperlipidemia   . Hypertension   . Myocardial infarction ([place]) 09/2011  . Nonischemic cardiomyopathy (HCC)    Previous ejection fraction of 35-40% on echo. 50% on cardiac catheterization in August of 2013  . Obesity   . Sleep apnea     HOSPITAL COURSE:   51 year old male with past medical history of diabetes, obesity, GERD, gastritis, hypertension, hyperlipidemia, obstructive sleep apnea who was admitted to the hospital due to chest pain.  1. Chest pain-patient was observed in the hospital on telemetry, had 3 sets of cardiac markers checked which were negative. -He underwent a nuclear medicine stress test which was abnormal and therefore subsequently underwent cardiac catheterization which showed no evidence of significant coronary artery disease. -He is currently chest pain-free and hemodynamically stable and therefore being discharged home. -We will continue aspirin and statin.  2. Essential HTN - pt. Will cont. Lisinopril/HCTZ, Toprol.   3. DM type II w/out complication - pt. Will resume his Trulicity.   4. GERD-Patient will resume his omeprazole.  5.  Depression-patient will continue his Celexa.  DISCHARGE CONDITIONS:   Stable.   CONSULTS OBTAINED:  Treatment Team:  Nelva Bush, MD  DRUG ALLERGIES:   Allergies  Allergen Reactions  . Statins Rash         DISCHARGE MEDICATIONS:   Allergies as of 06/11/2016      Reactions   Statins Rash         Medication List    STOP taking these medications   azithromycin 250 MG tablet Commonly known as:  ZITHROMAX Z-PAK   furosemide 20 MG tablet Commonly known as:  LASIX     TAKE these medications   albuterol 108 (90 Base) MCG/ACT inhaler Commonly known as:  PROVENTIL HFA Inhale 2 puffs into the lungs every 4 (four) hours as needed for wheezing or shortness of breath.   aspirin EC 81 MG tablet Take 81 mg by mouth daily.   chlorpheniramine-HYDROcodone 10-8 MG/5ML Suer Commonly known as:  TUSSIONEX PENNKINETIC ER Take 5 mLs by mouth 2 (two) times daily.   citalopram 20 MG tablet Commonly known as:  CELEXA Take 1 tablet (20 mg total) by mouth daily.   insulin detemir 100 UNIT/ML injection Commonly known as:  LEVEMIR Inject 26 Units into the skin every morning.   lisinopril-hydrochlorothiazide 20-25 MG tablet Commonly known as:  PRINZIDE,ZESTORETIC Take 1 tablet by mouth daily.   metoprolol succinate 25 MG 24 hr tablet Commonly known as:  TOPROL-XL Take 1 tablet (25 mg total) by mouth daily.   nitroGLYCERIN 0.4 MG SL tablet Commonly known as:  NITROSTAT Place 1 tablet (0.4 mg total)  under the tongue every 5 (five) minutes as needed for chest pain.   omeprazole 40 MG capsule Commonly known as:  PRILOSEC TAKE 1 CAPSULE (40 MG TOTAL) BY MOUTH DAILY.   ondansetron 4 MG disintegrating tablet Commonly known as:  ZOFRAN ODT Take 1 tablet (4 mg total) by mouth every 8 (eight) hours as needed for nausea or vomiting.   potassium chloride SA 20 MEQ tablet Commonly known as:  K-DUR,KLOR-CON Take 1 tablet (20 mEq total) by mouth daily.   pravastatin 20 MG  tablet Commonly known as:  PRAVACHOL Take 1 tablet (20 mg total) by mouth daily.   prochlorperazine 10 MG tablet Commonly known as:  COMPAZINE Take 1 tablet (10 mg total) by mouth every 6 (six) hours as needed for nausea.   TRULICITY 1.5 JS/2.8BT Sopn Generic drug:  Dulaglutide Inject 1.5 mg into the skin once a week.   Vitamin D3 5000 units Caps Take 5,000 Units by mouth daily.         DISCHARGE INSTRUCTIONS:   DIET:  Cardiac diet and Diabetic diet  DISCHARGE CONDITION:  Stable  ACTIVITY:  Activity as tolerated  OXYGEN:  Home Oxygen: No.   Oxygen Delivery: room air  DISCHARGE LOCATION:  home   If you experience worsening of your admission symptoms, develop shortness of breath, life threatening emergency, suicidal or homicidal thoughts you must seek medical attention immediately by calling 911 or calling your MD immediately  if symptoms less severe.  You Must read complete instructions/literature along with all the possible adverse reactions/side effects for all the Medicines you take and that have been prescribed to you. Take any new Medicines after you have completely understood and accpet all the possible adverse reactions/side effects.   Please note  You were cared for by a hospitalist during your hospital stay. If you have any questions about your discharge medications or the care you received while you were in the hospital after you are discharged, you can call the unit and asked to speak with the hospitalist on call if the hospitalist that took care of you is not available. Once you are discharged, your primary care physician will handle any further medical issues. Please note that NO REFILLS for any discharge medications will be authorized once you are discharged, as it is imperative that you return to your primary care physician (or establish a relationship with a primary care physician if you do not have one) for your aftercare needs so that they can reassess  your need for medications and monitor your lab values.     Today   No Chest Pain.  s/p Cardiac Cath showing no evidence of CAD. Will d/c home today.   VITAL SIGNS:  Blood pressure 140/78, pulse 73, temperature 97.8 F (36.6 C), temperature source Oral, resp. rate 14, height 6\' 2"  (1.88 m), weight (!) 149.7 kg (330 lb), SpO2 95 %.  I/O:    Intake/Output Summary (Last 24 hours) at 06/11/16 1456 Last data filed at 06/11/16 1402  Gross per 24 hour  Intake              480 ml  Output             1125 ml  Net             -645 ml    PHYSICAL EXAMINATION:  GENERAL:  51 y.o.-year-old obese patient lying in bed in no acute distress.  EYES: Pupils equal, round, reactive to light and accommodation. No scleral icterus.  Extraocular muscles intact.  HEENT: Head atraumatic, normocephalic. Oropharynx and nasopharynx clear.  NECK:  Supple, no jugular venous distention. No thyroid enlargement, no tenderness.  LUNGS: Normal breath sounds bilaterally, no wheezing, rales,rhonchi. No use of accessory muscles of respiration.  CARDIOVASCULAR: S1, S2 normal. No murmurs, rubs, or gallops.  ABDOMEN: Soft, non-tender, non-distended. Bowel sounds present. No organomegaly or mass.  EXTREMITIES: No pedal edema, cyanosis, or clubbing.  NEUROLOGIC: Cranial nerves II through XII are intact. No focal motor or sensory defecits b/l.  PSYCHIATRIC: The patient is alert and oriented x 3.  SKIN: No obvious rash, lesion, or ulcer.   DATA REVIEW:   CBC  Recent Labs Lab 06/11/16 0442  WBC 7.9  HGB 14.2  HCT 41.4  PLT 227    Chemistries   Recent Labs Lab 06/08/16 1148 06/11/16 0442  NA 137  --   K 3.8  --   CL 101  --   CO2 27  --   GLUCOSE 204*  --   BUN 11  --   CREATININE 0.79 0.91  CALCIUM 9.5  --     Cardiac Enzymes  Recent Labs Lab 06/09/16 0319  TROPONINI <0.03      RADIOLOGY:  Nm Myocar Multi W/spect W/wall Motion / Ef  Result Date: 06/10/2016  Abnormal, potentially high-risk  study.  There is a large in size, mild in severity, partially reversible defect involving the anterior and anterolateral walls. While this could represent artifact (motion and attenuation), ischemia and an element of scar cannot be excluded.  The left ventricular ejection fraction is moderately decreased (39%).       Management plans discussed with the patient, family and they are in agreement.  CODE STATUS:     Code Status Orders        Start     Ordered   06/11/16 1105  Full code  Continuous     06/11/16 1104    Code Status History    Date Active Date Inactive Code Status Order ID Comments User Context   06/08/2016  3:22 PM 06/11/2016 11:04 AM Full Code [id number]  Baxter Hire, MD Inpatient      TOTAL TIME TAKING CARE OF THIS PATIENT: 40 minutes.    Henreitta Leber M.D on 06/11/2016 at 2:56 PM  Between 7am to 6pm - Pager - [phone]  After 6pm go to [email or web address] - [employer] [facility]  Office  [phone]  CC: Primary care physician; Ronnell Freshwater, NP

## 2016-06-13 LAB — HIV ANTIBODY (ROUTINE TESTING W REFLEX): HIV SCREEN 4TH GENERATION: NONREACTIVE

## 2016-06-23 DIAGNOSIS — E1165 Type 2 diabetes mellitus with hyperglycemia: Secondary | ICD-10-CM | POA: Diagnosis not present

## 2016-06-23 DIAGNOSIS — I10 Essential (primary) hypertension: Secondary | ICD-10-CM | POA: Diagnosis not present

## 2016-06-23 DIAGNOSIS — R079 Chest pain, unspecified: Secondary | ICD-10-CM | POA: Diagnosis not present

## 2016-07-06 NOTE — Progress Notes (Signed)
Cardiology Office Note  Date:  07/07/2016   ID:  Gerald [name], DOB [birth year], MRN [id number]  PCP:  Ronnell Freshwater, NP   Chief Complaint  Patient presents with  . other    hospital follow up. Patient denies chest pain and SOB. Patient was in the hospital 06/08/16 and heart cath on 06/11/16. Meds reviewed verbally with.     HPI:  Gerald [name] is a 51 y.o. male  with a history of Obesity Chronic chest pain nonobstructive CAD with 30% mid LAD stenosis by catheterization in 2013, 2016, 2018 nonischemic cardiomyopathy with normalization of LV function,  type 2 diabetes mellitus,  hypertension,  hyperlipidemia,  obstructive sleep apnea on CPAP,  morbid obesity.  Hospital admission May 2018 for chest pain Cardiac catheterization May 2018 showing nonobstructive coronary disease Who presents to establish care in the [facility] office and for follow-up of his chest pain and CAD  Hospital records reviewed with the patient in detail myocardial perfusion stress test (2 day study),  revealed moderately reduced LV function with global hypokinesis as well as concern for anterior and anterolateral ischemia/scar versus artifact.   Cardiac cath Nonobstructive disease 30 to 40% proximal to mid LAD, unchanged from previous study No further cardiac workup needed Chest pain likely noncardiac in nature Recommendation made to Continue asa 81 mg daily with aggressive statin therapy, Goal LDL <70  He remembers having lab work done recently by primary care HBA1C 7.1 Rash on statins, does not remember which ones he tried  Feels he is back to his normal self, exercising with no symptoms  EKG personally reviewed by myself on todays visit Normal sinus rhythm with rate 80 bpm no significant ST or T-wave changes    PMH:   has a past medical history of Abnormal LFTs; Coronary artery disease; Diabetes mellitus without complication ([place]); Fundic gland polyps of stomach, benign; Gastritis;  GERD (gastroesophageal reflux disease); Hepatic steatosis; Hyperlipidemia; Hypertension; Myocardial infarction [facility]) (09/2011); Nonischemic cardiomyopathy ([place]); Obesity; and Sleep apnea.  PSH:    Past Surgical History:  Procedure Laterality Date  . CARDIAC CATHETERIZATION N/A 09/2011   [facility]; EF 50% with 30% mid LAD stenosis and no obstructive disease.  Marland Kitchen CARDIAC CATHETERIZATION  09/2010   [facility]; Mid LAD 40% stenosis; Mid Circumflex:Normal; Mid RCA; Normal  . CARDIAC CATHETERIZATION    . COLONOSCOPY    . KNEE ARTHROSCOPY WITH MEDIAL MENISECTOMY Right 09/16/2012   Procedure: RIGHT KNEE ARTHROSCOPY WITH MEDIAL AND LATERAL MENISECTOMY, CHONDROPLASTY;  Surgeon: Ninetta Lights, MD;  Location: [place];  Service: Orthopedics;  Laterality: Right;  RIGHT KNEE SCOPE MEDIAL MENISCECTOMY  . LEFT HEART CATH AND CORONARY ANGIOGRAPHY N/A 06/11/2016   Procedure: Left Heart Cath and Coronary Angiography;  Surgeon: Minna Merritts, MD;  Location: [place] [facility];  Service: Cardiovascular;  Laterality: N/A;    Current Outpatient Prescriptions  Medication Sig Dispense Refill  . albuterol (PROVENTIL HFA) 108 (90 BASE) MCG/ACT inhaler Inhale 2 puffs into the lungs every 4 (four) hours as needed for wheezing or shortness of breath. 1 Inhaler 0  . aspirin EC 81 MG tablet Take 81 mg by mouth daily.    . chlorpheniramine-HYDROcodone (TUSSIONEX PENNKINETIC ER) 10-8 MG/5ML SUER Take 5 mLs by mouth 2 (two) times daily. 140 mL 0  . Cholecalciferol (VITAMIN D3) 5000 UNITS CAPS Take 5,000 Units by mouth daily.    . citalopram (CELEXA) 20 MG tablet Take 1 tablet (20 mg total) by mouth daily. 90 tablet 0  .  Dulaglutide (TRULICITY) 1.5 GY/6.9SW SOPN Inject 1.5 mg into the skin once a week.    . insulin detemir (LEVEMIR) 100 UNIT/ML injection Inject 26 Units into the skin every morning.    Marland Kitchen lisinopril-hydrochlorothiazide (PRINZIDE,ZESTORETIC) 20-25 MG tablet Take 1 tablet by mouth daily.    .  nitroGLYCERIN (NITROSTAT) 0.4 MG SL tablet Place 1 tablet (0.4 mg total) under the tongue every 5 (five) minutes as needed for chest pain. 75 tablet 1  . omeprazole (PRILOSEC) 40 MG capsule TAKE 1 CAPSULE (40 MG TOTAL) BY MOUTH DAILY. 30 capsule 3  . ondansetron (ZOFRAN ODT) 4 MG disintegrating tablet Take 1 tablet (4 mg total) by mouth every 8 (eight) hours as needed for nausea or vomiting. 20 tablet 0  . potassium chloride SA (K-DUR,KLOR-CON) 20 MEQ tablet Take 1 tablet (20 mEq total) by mouth daily. 90 tablet 1   No current facility-administered medications for this visit.      Allergies:   Statins   Social History:  The patient  reports that he has never smoked. He has never used smokeless tobacco. He reports that he does not drink alcohol or use drugs.   Family History:   family history includes Breast cancer in his mother; Heart attack in his father; Heart disease (age of onset: 57) in his father; Hypertension in his mother; Stomach cancer in his father.    Review of Systems: Review of Systems  Constitutional: Negative.   Respiratory: Negative.   Cardiovascular: Negative.   Gastrointestinal: Negative.   Musculoskeletal: Negative.   Neurological: Negative.   Psychiatric/Behavioral: Negative.   All other systems reviewed and are negative.    PHYSICAL EXAM: VS:  BP 116/70 (BP Location: Right Arm, Patient Position: Sitting, Cuff Size: Large)   Pulse 80   Ht 6\' 1"  (1.854 m)   Wt (!) 332 lb (150.6 kg)   BMI 43.80 kg/m  , BMI Body mass index is 43.8 kg/m. GEN: Well nourished, well developed, in no acute distress , obese HEENT: normal  Neck: no JVD, carotid bruits, or masses Cardiac: RRR; no murmurs, rubs, or gallops,no edema  Respiratory:  clear to auscultation bilaterally, normal work of breathing GI: soft, nontender, nondistended, + BS MS: no deformity or atrophy  Skin: warm and dry, no rash Neuro:  Strength and sensation are intact Psych: euthymic mood, full  affect    Recent Labs: 12/01/2015: ALT 74 06/08/2016: B Natriuretic Peptide 16.0; BUN 11; Potassium 3.8; Sodium 137 06/11/2016: Creatinine, Ser 0.91; Hemoglobin 14.2; Platelets 227    Lipid Panel Lab Results  Component Value Date   CHOL 194 06/11/2016   HDL 35 (L) 06/11/2016   LDLCALC 123 (H) 06/11/2016   TRIG 179 (H) 06/11/2016      Wt Readings from Last 3 Encounters:  07/07/16 (!) 332 lb (150.6 kg)  06/08/16 (!) 330 lb (149.7 kg)  12/01/15 (!) 330 lb (149.7 kg)       ASSESSMENT AND PLAN:  Coronary artery disease of native artery of native heart with stable angina pectoris ([place]) - Plan: EKG 12-Lead Minimal LAD disease, stable over the past 5 years Several catheterizations with no significant change in his disease Additional episodes of chest pain in the years ahead could be managed with medications   Essential hypertension - Plan: EKG 12-Lead Blood pressure is well controlled on today's visit. No changes made to the medications.  Mixed hyperlipidemia - Plan: EKG 12-Lead He does not want a statin Suggested he could try Zetia. He will research this  and call us if he would like to start  Type 2 diabetes mellitus with other circulatory complication, without long-term current use of insulin ([place]) - Plan: EKG 12-Lead Recommended low carbohydrate diet Dietary guide provided  Chronic pain syndrome - Plan: EKG 12-Lead He has had 3 cardiac catheterizations in the past 5 years No further cardiac workup needed at this time Atypical chest pain symptoms  Obesity We have encouraged continued exercise, careful diet management in an effort to lose weight.   Disposition:   F/U  12 months when necessary   Orders Placed This Encounter  Procedures  . EKG 12-Lead     Signed, Esmond Plants, M.D., Ph.D. 07/07/2016  [place], [place]

## 2016-07-07 ENCOUNTER — Ambulatory Visit (INDEPENDENT_AMBULATORY_CARE_PROVIDER_SITE_OTHER): Payer: 59 | Admitting: Cardiovascular Disease

## 2016-07-07 ENCOUNTER — Encounter: Payer: Self-pay | Admitting: Cardiovascular Disease

## 2016-07-07 VITALS — BP 116/70 | HR 80 | Ht 73.0 in | Wt 332.0 lb

## 2016-07-07 DIAGNOSIS — I10 Essential (primary) hypertension: Secondary | ICD-10-CM | POA: Diagnosis not present

## 2016-07-07 DIAGNOSIS — E782 Mixed hyperlipidemia: Secondary | ICD-10-CM

## 2016-07-07 DIAGNOSIS — E1159 Type 2 diabetes mellitus with other circulatory complications: Secondary | ICD-10-CM

## 2016-07-07 DIAGNOSIS — G4733 Obstructive sleep apnea (adult) (pediatric): Secondary | ICD-10-CM | POA: Diagnosis not present

## 2016-07-07 DIAGNOSIS — G894 Chronic pain syndrome: Secondary | ICD-10-CM

## 2016-07-07 DIAGNOSIS — I25118 Atherosclerotic heart disease of native coronary artery with other forms of angina pectoris: Secondary | ICD-10-CM

## 2016-07-07 NOTE — Patient Instructions (Signed)
Medication Instructions:   No medication changes made  Research Zetia 10 mg pill once a day for cholesterol Not a statin  Labwork:  No new labs needed  Testing/Procedures:  No further testing at this time   I recommend watching educational videos on topics of interest to you at:       www.goemmi.com  Enter code: HEARTCARE    Follow-Up: It was a pleasure seeing you in the office today. Please call us if you have new issues that need to be addressed before your next appt.  [phone]  Your physician wants you to follow-up in: 12 months.  You will receive a reminder letter in the mail two months in advance. If you don't receive a letter, please call our office to schedule the follow-up appointment.  If you need a refill on your cardiac medications before your next appointment, please call your pharmacy.

## 2016-08-25 DIAGNOSIS — G4733 Obstructive sleep apnea (adult) (pediatric): Secondary | ICD-10-CM | POA: Diagnosis not present

## 2016-09-22 DIAGNOSIS — E1165 Type 2 diabetes mellitus with hyperglycemia: Secondary | ICD-10-CM | POA: Diagnosis not present

## 2016-09-22 DIAGNOSIS — I10 Essential (primary) hypertension: Secondary | ICD-10-CM | POA: Diagnosis not present

## 2016-09-22 DIAGNOSIS — I2583 Coronary atherosclerosis due to lipid rich plaque: Secondary | ICD-10-CM | POA: Diagnosis not present

## 2016-09-24 DIAGNOSIS — G4733 Obstructive sleep apnea (adult) (pediatric): Secondary | ICD-10-CM | POA: Diagnosis not present

## 2016-10-24 DIAGNOSIS — G4733 Obstructive sleep apnea (adult) (pediatric): Secondary | ICD-10-CM | POA: Diagnosis not present

## 2016-11-04 DIAGNOSIS — G4733 Obstructive sleep apnea (adult) (pediatric): Secondary | ICD-10-CM | POA: Diagnosis not present

## 2016-11-24 DIAGNOSIS — G4733 Obstructive sleep apnea (adult) (pediatric): Secondary | ICD-10-CM | POA: Diagnosis not present

## 2016-12-24 DIAGNOSIS — G4733 Obstructive sleep apnea (adult) (pediatric): Secondary | ICD-10-CM | POA: Diagnosis not present

## 2017-01-10 ENCOUNTER — Other Ambulatory Visit: Payer: Self-pay

## 2017-01-10 ENCOUNTER — Encounter: Payer: Self-pay | Admitting: Emergency Medicine

## 2017-01-10 ENCOUNTER — Emergency Department
Admission: EM | Admit: 2017-01-10 | Discharge: 2017-01-10 | Disposition: A | Discharge status: Home / Self Care | Payer: 59 | Attending: Emergency Medicine | Admitting: Emergency Medicine

## 2017-01-10 ENCOUNTER — Emergency Department: Payer: 59

## 2017-01-10 DIAGNOSIS — M545 Low back pain, unspecified: Secondary | ICD-10-CM

## 2017-01-10 DIAGNOSIS — R3915 Urgency of urination: Secondary | ICD-10-CM | POA: Insufficient documentation

## 2017-01-10 DIAGNOSIS — I2510 Atherosclerotic heart disease of native coronary artery without angina pectoris: Secondary | ICD-10-CM | POA: Insufficient documentation

## 2017-01-10 DIAGNOSIS — I252 Old myocardial infarction: Secondary | ICD-10-CM | POA: Insufficient documentation

## 2017-01-10 DIAGNOSIS — M62830 Muscle spasm of back: Secondary | ICD-10-CM | POA: Diagnosis not present

## 2017-01-10 DIAGNOSIS — K760 Fatty (change of) liver, not elsewhere classified: Secondary | ICD-10-CM | POA: Diagnosis not present

## 2017-01-10 DIAGNOSIS — R1031 Right lower quadrant pain: Secondary | ICD-10-CM | POA: Insufficient documentation

## 2017-01-10 DIAGNOSIS — I10 Essential (primary) hypertension: Secondary | ICD-10-CM | POA: Insufficient documentation

## 2017-01-10 DIAGNOSIS — E119 Type 2 diabetes mellitus without complications: Secondary | ICD-10-CM | POA: Diagnosis not present

## 2017-01-10 LAB — TROPONIN I

## 2017-01-10 LAB — CBC
HCT: 39.1 % — ABNORMAL LOW (ref 40.0–52.0)
Hemoglobin: 13.3 g/dL (ref 13.0–18.0)
MCH: 29.4 pg (ref 26.0–34.0)
MCHC: 33.9 g/dL (ref 32.0–36.0)
MCV: 86.7 fL (ref 80.0–100.0)
PLATELETS: 220 10*3/uL (ref 150–440)
RBC: 4.51 MIL/uL (ref 4.40–5.90)
RDW: 14.3 % (ref 11.5–14.5)
WBC: 9.5 10*3/uL (ref 3.8–10.6)

## 2017-01-10 LAB — URINALYSIS, COMPLETE (UACMP) WITH MICROSCOPIC
BACTERIA UA: NONE SEEN
BILIRUBIN URINE: NEGATIVE
Glucose, UA: >=500 mg/dL — AB
HGB URINE DIPSTICK: NEGATIVE
Ketones, ur: NEGATIVE mg/dL
LEUKOCYTES UA: NEGATIVE
Nitrite: NEGATIVE
PROTEIN: NEGATIVE mg/dL
RBC / HPF: NONE SEEN RBC/hpf (ref 0–5)
SPECIFIC GRAVITY, URINE: 1.016 (ref 1.005–1.030)
pH: 5 (ref 5.0–8.0)

## 2017-01-10 LAB — BASIC METABOLIC PANEL
Anion gap: 9 (ref 5–15)
BUN: 14 mg/dL (ref 6–20)
CHLORIDE: 99 mmol/L — AB (ref 101–111)
CO2: 27 mmol/L (ref 22–32)
CREATININE: 0.93 mg/dL (ref 0.61–1.24)
Calcium: 8.9 mg/dL (ref 8.9–10.3)
Glucose, Bld: 275 mg/dL — ABNORMAL HIGH (ref 65–99)
Potassium: 4.1 mmol/L (ref 3.5–5.1)
SODIUM: 135 mmol/L (ref 135–145)

## 2017-01-10 MED ORDER — SODIUM CHLORIDE 0.9 % IV SOLN
Freq: Once | INTRAVENOUS | Status: AC
  Administered 2017-01-10: 21:00:00 via INTRAVENOUS

## 2017-01-10 MED ORDER — IBUPROFEN 800 MG PO TABS: 800.0000 mg | ORAL_TABLET | Freq: Three times a day (TID) | ORAL | 0 refills | Status: DC | PRN

## 2017-01-10 MED ORDER — HYDROMORPHONE HCL 1 MG/ML IJ SOLN
INTRAMUSCULAR | Status: AC
  Filled 2017-01-10: qty 1

## 2017-01-10 MED ORDER — HYDROMORPHONE HCL 1 MG/ML IJ SOLN
1.0000 mg | Freq: Once | INTRAMUSCULAR | Status: AC
  Administered 2017-01-10: 1 mg via INTRAVENOUS
  Filled 2017-01-10: qty 1

## 2017-01-10 MED ORDER — FENTANYL CITRATE (PF) 100 MCG/2ML IJ SOLN
50.0000 ug | INTRAMUSCULAR | Status: DC | PRN
  Administered 2017-01-10: 50 ug via INTRAVENOUS
  Filled 2017-01-10: qty 2

## 2017-01-10 MED ORDER — DIAZEPAM 5 MG PO TABS
5.0000 mg | ORAL_TABLET | Freq: Once | ORAL | Status: AC
  Administered 2017-01-10: 5 mg via ORAL
  Filled 2017-01-10: qty 1

## 2017-01-10 MED ORDER — HYDROMORPHONE HCL 1 MG/ML IJ SOLN
1.0000 mg | Freq: Once | INTRAMUSCULAR | Status: AC
  Administered 2017-01-10: 1 mg via INTRAVENOUS

## 2017-01-10 MED ORDER — TRAMADOL HCL 50 MG PO TABS: 50.0000 mg | ORAL_TABLET | Freq: Four times a day (QID) | ORAL | 0 refills | Status: DC | PRN

## 2017-01-10 MED ORDER — DIAZEPAM 5 MG PO TABS: 5.0000 mg | ORAL_TABLET | Freq: Three times a day (TID) | ORAL | 0 refills | Status: DC | PRN

## 2017-01-10 MED ORDER — ONDANSETRON HCL 4 MG/2ML IJ SOLN
4.0000 mg | Freq: Once | INTRAMUSCULAR | Status: AC
  Administered 2017-01-10: 4 mg via INTRAVENOUS
  Filled 2017-01-10: qty 2

## 2017-01-10 NOTE — ED Notes (Signed)
Pt sleeping. 

## 2017-01-10 NOTE — ED Triage Notes (Signed)
Pt ambulatory to triage w/ difficulty reports right lower back pain starting last night, reports urine darker in color and feels like something may be obstructing.  Wife reports some urgency problems as well.  Pt visibly uncomfortable in triage.

## 2017-01-10 NOTE — ED Notes (Signed)
Family at bedside. 

## 2017-01-10 NOTE — ED Notes (Addendum)
Pt to the er for pain to the right side back that radiates around to the flank. Pt reports difficulty urinating and difficulty making it to the bathroom. Pt says it dribbles out and doesn't stop. Pt laying in bed with eyes closed. No eye contact.

## 2017-01-10 NOTE — ED Notes (Signed)
Pt reports chest pressure w/ back pain as well.

## 2017-01-10 NOTE — ED Provider Notes (Signed)
[facility] Emergency Department Provider Note       Time seen: ----------------------------------------- 8:37 PM on 01/10/2017 -----------------------------------------   I have reviewed the triage vital signs and the nursing notes.  HISTORY   Chief Complaint Back Pain    HPI Gerald [name] is a 51 y.o. male with a history of chronic pain, coronary artery disease, diabetes who presents to the ED for right lower back pain that started last night.  Patient reports his urine is darker in color and feels like something may be obstructing.  Wife reports she has had some urgency problems as well.  He has never had pain like this before.  Pain is currently 6 out of 10 in the right low back.  Nothing makes it better, movement makes it worse.  Past Medical History:  Diagnosis Date  . Abnormal LFTs   . Coronary artery disease    Mild nonobstructive coronary artery disease on previous cardiac catheterization most recently in August of 2013  . Diabetes mellitus without complication (HCC)    borderline  . Fundic gland polyps of stomach, benign   . Gastritis   . GERD (gastroesophageal reflux disease)   . Hepatic steatosis   . Hyperlipidemia   . Hypertension   . Myocardial infarction ([place]) 09/2011  . Nonischemic cardiomyopathy (HCC)    Previous ejection fraction of 35-40% on echo. 50% on cardiac catheterization in August of 2013  . Obesity   . Sleep apnea     Patient Active Problem List   Diagnosis Date Noted  . Morbid obesity ([place]) 07/07/2016  . Chest pain 06/08/2016  . Other chest pain 05/22/2014  . Mass of jaw 05/22/2014  . Right flank pain 11/23/2013  . Dysuria 11/23/2013  . Encounter for wellness examination 11/08/2013  . Retaining fluid 11/08/2013  . Urinary incontinence 03/14/2013  . Depression 01/13/2013  . Type 2 diabetes mellitus ([place]) 09/14/2012  . Nonischemic cardiomyopathy ([place])   . Mixed hyperlipidemia 05/28/2012  . CAD (coronary  artery disease) 03/30/2012  . Chronic pain syndrome 03/30/2012  . HTN (hypertension) 03/30/2012    Past Surgical History:  Procedure Laterality Date  . CARDIAC CATHETERIZATION N/A 09/2011   [facility]; EF 50% with 30% mid LAD stenosis and no obstructive disease.  Marland Kitchen CARDIAC CATHETERIZATION  09/2010   [facility]; Mid LAD 40% stenosis; Mid Circumflex:Normal; Mid RCA; Normal  . CARDIAC CATHETERIZATION    . COLONOSCOPY    . KNEE ARTHROSCOPY WITH MEDIAL MENISECTOMY Right 09/16/2012   Procedure: RIGHT KNEE ARTHROSCOPY WITH MEDIAL AND LATERAL MENISECTOMY, CHONDROPLASTY;  Surgeon: Ninetta Lights, MD;  Location: [place];  Service: Orthopedics;  Laterality: Right;  RIGHT KNEE SCOPE MEDIAL MENISCECTOMY  . LEFT HEART CATH AND CORONARY ANGIOGRAPHY N/A 06/11/2016   Procedure: Left Heart Cath and Coronary Angiography;  Surgeon: Minna Merritts, MD;  Location: [place] [facility];  Service: Cardiovascular;  Laterality: N/A;    Allergies Statins  Social History Social History   Tobacco Use  . Smoking status: Never Smoker  . Smokeless tobacco: Never Used  Substance Use Topics  . Alcohol use: No    Alcohol/week: 0.0 oz    Comment: rare  . Drug use: No    Review of Systems Constitutional: Negative for fever. Eyes: Negative for vision changes ENT:  Negative for congestion, sore throat Cardiovascular: Negative for chest pain. Respiratory: Negative for shortness of breath. Gastrointestinal: Negative for abdominal pain, vomiting and diarrhea. Genitourinary: Positive for urinary hesitancy and dark urine Musculoskeletal:  Positive for back pain Skin: Negative for rash. Neurological: Negative for headaches, focal weakness or numbness.  All systems negative/normal/unremarkable except as stated in the HPI  ____________________________________________   PHYSICAL EXAM:  VITAL SIGNS: ED Triage Vitals [01/10/17 2001]  Enc Vitals Group     BP (!) 195/109     Pulse Rate 86     Resp (!) 24      Temp 98 F (36.7 C)     Temp Source Oral     SpO2 98 %     Weight (!) 328 lb (148.8 kg)     Height 6\' 3"  (1.905 m)     Head Circumference      Peak Flow      Pain Score 10     Pain Loc      Pain Edu?      Excl. in [place]?     Constitutional: Alert and oriented.  Mild distress from pain Eyes: Conjunctivae are normal. Normal extraocular movements. ENT   Head: Normocephalic and atraumatic.   Nose: No congestion/rhinnorhea.   Mouth/Throat: Mucous membranes are moist.   Neck: No stridor. Cardiovascular: Normal rate, regular rhythm. No murmurs, rubs, or gallops. Respiratory: Normal respiratory effort without tachypnea nor retractions. Breath sounds are clear and equal bilaterally. No wheezes/rales/rhonchi. Gastrointestinal: Soft and nontender. Normal bowel sounds Musculoskeletal: Nontender with normal range of motion in extremities.  Tenderness noted in the right lower back Neurologic:  Normal speech and language. No gross focal neurologic deficits are appreciated.  Skin:  Skin is warm, dry and intact. No rash noted. Psychiatric: Mood and affect are normal. Speech and behavior are normal.  ____________________________________________  EKG: Interpreted by me.  Sinus rhythm the rate of 77 bpm, prolonged PR interval, normal QRS, normal QT  ____________________________________________  ED COURSE:  Pertinent labs & imaging results that were available during my care of the patient were reviewed by me and considered in my medical decision making (see chart for details). Patient presents for right-sided low back pain, we will assess with labs and imaging as indicated.   Procedures ____________________________________________   LABS (pertinent positives/negatives)  Labs Reviewed  URINALYSIS, COMPLETE (UACMP) WITH MICROSCOPIC - Abnormal; Notable for the following components:      Result Value   Color, Urine YELLOW (*)    APPearance CLEAR (*)    Glucose, UA >=500 (*)     Squamous Epithelial / LPF 0-5 (*)    All other components within normal limits  BASIC METABOLIC PANEL - Abnormal; Notable for the following components:   Chloride 99 (*)    Glucose, Bld 275 (*)    All other components within normal limits  CBC - Abnormal; Notable for the following components:   HCT 39.1 (*)    All other components within normal limits  TROPONIN I    RADIOLOGY CT renal protocol IMPRESSION: 1. No nephrolithiasis or obstructive uropathy. 2. Chronic stranding at the base the mesenteries as seen on multiple prior studies. 3. Hepatic steatosis.  No discrete lesions are present.   ____________________________________________  DIFFERENTIAL DIAGNOSIS   Renal colic, muscle strain, muscle spasm, pyelonephritis, UTI, degenerative disc disease, shingles  FINAL ASSESSMENT AND PLAN  Flank pain, muscle spasm   Plan: Patient had presented for severe right flank pain. Patient's labs are reassuring. Patient's imaging also did not reveal any acute process.  He has specific tenderness in the right lumbar paraspinous muscle groups.  This seems to be muscular in origin and I will prescribe short supply pain  medicine as well as Valium for muscle relaxation.  He is stable for outpatient follow-up.   [name], MD   Note: This note was generated in part or whole with voice recognition software. Voice recognition is usually quite accurate but there are transcription errors that can and very often do occur. I apologize for any typographical errors that were not detected and corrected.     [name], MD 01/10/17 [id number]

## 2017-01-23 DIAGNOSIS — G4733 Obstructive sleep apnea (adult) (pediatric): Secondary | ICD-10-CM | POA: Diagnosis not present

## 2017-01-29 ENCOUNTER — Other Ambulatory Visit: Payer: Self-pay | Admitting: Nurse Practitioner

## 2017-01-29 ENCOUNTER — Ambulatory Visit: Payer: Self-pay | Admitting: Nurse Practitioner

## 2017-01-29 DIAGNOSIS — J019 Acute sinusitis, unspecified: Secondary | ICD-10-CM

## 2017-01-29 DIAGNOSIS — R059 Cough, unspecified: Secondary | ICD-10-CM

## 2017-01-29 DIAGNOSIS — R05 Cough: Secondary | ICD-10-CM

## 2017-01-29 MED ORDER — HYDROCOD POLST-CPM POLST ER 10-8 MG/5ML PO SUER: 5.0000 mL | Freq: Two times a day (BID) | ORAL | 0 refills | Status: DC

## 2017-01-29 MED ORDER — GUAIFENESIN ER 600 MG PO TB12: 600.0000 mg | ORAL_TABLET | Freq: Two times a day (BID) | ORAL | 0 refills | Status: DC | PRN

## 2017-01-29 MED ORDER — SULFAMETHOXAZOLE-TRIMETHOPRIM 800-160 MG PO TABS: 1.0000 | ORAL_TABLET | Freq: Two times a day (BID) | ORAL | 0 refills | Status: DC

## 2017-01-29 NOTE — Progress Notes (Signed)
Sent in bactrim BID for 10 days, mucinex 600mg  Bid prn, and tussionex bid prn cough to [facility] [address].

## 2017-02-02 ENCOUNTER — Ambulatory Visit: Payer: Self-pay | Admitting: Nurse Practitioner

## 2017-02-02 DIAGNOSIS — R609 Edema, unspecified: Secondary | ICD-10-CM | POA: Insufficient documentation

## 2017-02-02 DIAGNOSIS — R0602 Shortness of breath: Secondary | ICD-10-CM | POA: Insufficient documentation

## 2017-02-02 DIAGNOSIS — M791 Myalgia, unspecified site: Secondary | ICD-10-CM | POA: Insufficient documentation

## 2017-02-02 DIAGNOSIS — M25569 Pain in unspecified knee: Secondary | ICD-10-CM | POA: Insufficient documentation

## 2017-02-02 DIAGNOSIS — R5383 Other fatigue: Secondary | ICD-10-CM | POA: Insufficient documentation

## 2017-02-02 DIAGNOSIS — G4710 Hypersomnia, unspecified: Secondary | ICD-10-CM | POA: Insufficient documentation

## 2017-02-02 DIAGNOSIS — F411 Generalized anxiety disorder: Secondary | ICD-10-CM | POA: Insufficient documentation

## 2017-02-02 DIAGNOSIS — R635 Abnormal weight gain: Secondary | ICD-10-CM | POA: Insufficient documentation

## 2017-02-02 DIAGNOSIS — L700 Acne vulgaris: Secondary | ICD-10-CM | POA: Insufficient documentation

## 2017-02-02 DIAGNOSIS — R945 Abnormal results of liver function studies: Secondary | ICD-10-CM | POA: Insufficient documentation

## 2017-02-02 DIAGNOSIS — D485 Neoplasm of uncertain behavior of skin: Secondary | ICD-10-CM | POA: Insufficient documentation

## 2017-02-02 DIAGNOSIS — I6523 Occlusion and stenosis of bilateral carotid arteries: Secondary | ICD-10-CM | POA: Insufficient documentation

## 2017-02-02 DIAGNOSIS — I739 Peripheral vascular disease, unspecified: Secondary | ICD-10-CM | POA: Insufficient documentation

## 2017-02-02 DIAGNOSIS — G4726 Circadian rhythm sleep disorder, shift work type: Secondary | ICD-10-CM | POA: Insufficient documentation

## 2017-02-23 DIAGNOSIS — G4733 Obstructive sleep apnea (adult) (pediatric): Secondary | ICD-10-CM | POA: Diagnosis not present

## 2017-02-25 ENCOUNTER — Emergency Department: Payer: 59

## 2017-02-25 ENCOUNTER — Other Ambulatory Visit: Payer: Self-pay

## 2017-02-25 ENCOUNTER — Emergency Department
Admission: EM | Admit: 2017-02-25 | Discharge: 2017-02-25 | Disposition: A | Discharge status: Home / Self Care | Payer: 59 | Attending: Emergency Medicine | Admitting: Emergency Medicine

## 2017-02-25 DIAGNOSIS — J984 Other disorders of lung: Secondary | ICD-10-CM | POA: Diagnosis not present

## 2017-02-25 DIAGNOSIS — F411 Generalized anxiety disorder: Secondary | ICD-10-CM | POA: Insufficient documentation

## 2017-02-25 DIAGNOSIS — E119 Type 2 diabetes mellitus without complications: Secondary | ICD-10-CM | POA: Diagnosis not present

## 2017-02-25 DIAGNOSIS — Z794 Long term (current) use of insulin: Secondary | ICD-10-CM | POA: Insufficient documentation

## 2017-02-25 DIAGNOSIS — F329 Major depressive disorder, single episode, unspecified: Secondary | ICD-10-CM | POA: Insufficient documentation

## 2017-02-25 DIAGNOSIS — R0602 Shortness of breath: Secondary | ICD-10-CM | POA: Diagnosis not present

## 2017-02-25 DIAGNOSIS — Z7982 Long term (current) use of aspirin: Secondary | ICD-10-CM | POA: Insufficient documentation

## 2017-02-25 DIAGNOSIS — R059 Cough, unspecified: Secondary | ICD-10-CM

## 2017-02-25 DIAGNOSIS — I252 Old myocardial infarction: Secondary | ICD-10-CM | POA: Insufficient documentation

## 2017-02-25 DIAGNOSIS — I10 Essential (primary) hypertension: Secondary | ICD-10-CM | POA: Insufficient documentation

## 2017-02-25 DIAGNOSIS — R05 Cough: Secondary | ICD-10-CM | POA: Diagnosis not present

## 2017-02-25 DIAGNOSIS — B9789 Other viral agents as the cause of diseases classified elsewhere: Secondary | ICD-10-CM

## 2017-02-25 DIAGNOSIS — Z79899 Other long term (current) drug therapy: Secondary | ICD-10-CM | POA: Insufficient documentation

## 2017-02-25 DIAGNOSIS — J069 Acute upper respiratory infection, unspecified: Secondary | ICD-10-CM | POA: Insufficient documentation

## 2017-02-25 DIAGNOSIS — I2510 Atherosclerotic heart disease of native coronary artery without angina pectoris: Secondary | ICD-10-CM | POA: Diagnosis not present

## 2017-02-25 LAB — GROUP A STREP BY PCR: Group A Strep by PCR: NOT DETECTED

## 2017-02-25 LAB — CBC
HEMATOCRIT: 40 % (ref 40.0–52.0)
Hemoglobin: 13.6 g/dL (ref 13.0–18.0)
MCH: 29.1 pg (ref 26.0–34.0)
MCHC: 33.9 g/dL (ref 32.0–36.0)
MCV: 85.6 fL (ref 80.0–100.0)
Platelets: 222 10*3/uL (ref 150–440)
RBC: 4.67 MIL/uL (ref 4.40–5.90)
RDW: 14.2 % (ref 11.5–14.5)
WBC: 6.9 10*3/uL (ref 3.8–10.6)

## 2017-02-25 LAB — INFLUENZA PANEL BY PCR (TYPE A & B)
INFLAPCR: NEGATIVE
INFLBPCR: NEGATIVE

## 2017-02-25 LAB — BASIC METABOLIC PANEL
ANION GAP: 9 (ref 5–15)
BUN: 15 mg/dL (ref 6–20)
CHLORIDE: 101 mmol/L (ref 101–111)
CO2: 26 mmol/L (ref 22–32)
Calcium: 8.7 mg/dL — ABNORMAL LOW (ref 8.9–10.3)
Creatinine, Ser: 0.93 mg/dL (ref 0.61–1.24)
GFR calc Af Amer: >60 mL/min (ref >60)
GLUCOSE: 280 mg/dL — AB (ref 65–99)
Potassium: 3.9 mmol/L (ref 3.5–5.1)
Sodium: 136 mmol/L (ref 135–145)

## 2017-02-25 LAB — TROPONIN I: Troponin I: <0.03 ng/mL (ref <0.03)

## 2017-02-25 MED ORDER — IPRATROPIUM-ALBUTEROL 0.5-2.5 (3) MG/3ML IN SOLN
3.0000 mL | Freq: Once | RESPIRATORY_TRACT | Status: AC
  Administered 2017-02-25: 3 mL via RESPIRATORY_TRACT

## 2017-02-25 MED ORDER — IPRATROPIUM-ALBUTEROL 0.5-2.5 (3) MG/3ML IN SOLN
RESPIRATORY_TRACT | Status: AC
  Administered 2017-02-25: 3 mL via RESPIRATORY_TRACT
  Filled 2017-02-25: qty 3

## 2017-02-25 MED ORDER — BENZONATATE 100 MG PO CAPS: 100.0000 mg | ORAL_CAPSULE | Freq: Four times a day (QID) | ORAL | 0 refills | Status: DC | PRN

## 2017-02-25 MED ORDER — ALBUTEROL SULFATE HFA 108 (90 BASE) MCG/ACT IN AERS: 2.0000 | INHALATION_SPRAY | Freq: Four times a day (QID) | RESPIRATORY_TRACT | 0 refills | Status: DC | PRN

## 2017-02-25 NOTE — Discharge Instructions (Signed)
Please seek medical attention for any high fevers, chest pain, shortness of breath, change in behavior, persistent vomiting, bloody stool or any other new or concerning symptoms.  

## 2017-02-25 NOTE — ED Notes (Signed)
Pt c/o body aches, sore throat, intermit fevers x few days.

## 2017-02-25 NOTE — ED Provider Notes (Signed)
[facility] Emergency Department Provider Note   ____________________________________________   I have reviewed the triage vital signs and the nursing notes.   HISTORY  Chief Complaint Shortness of Breath and Cough   History limited by: Not Limited   HPI Gerald [name] is a 52 y.o. male who presents to the emergency department today with primary concern for shortness of breath and cough.  The cough has been present for the past few days.  Is progressively gotten worse.  Today the patient has felt more short of breath.  This has been accompanied by body aches and generalized weakness.  Patient denies any known sick contacts.  He also has complaints of throat soreness and feels like it is swelling up.  Wife states that she has had multiple episodes of strep throat.  Patient states he has had multiple episodes of pneumonia.  Patient has been trying some over-the-counter medication without any significant relief.    Per medical record review patient has a history of CAD, DM.  Past Medical History:  Diagnosis Date  . Abnormal LFTs   . Coronary artery disease    Mild nonobstructive coronary artery disease on previous cardiac catheterization most recently in August of 2013  . Diabetes mellitus without complication (HCC)    borderline  . Fundic gland polyps of stomach, benign   . Gastritis   . GERD (gastroesophageal reflux disease)   . Hepatic steatosis   . Hyperlipidemia   . Hypertension   . Myocardial infarction ([place]) 09/2011  . Nonischemic cardiomyopathy (HCC)    Previous ejection fraction of 35-40% on echo. 50% on cardiac catheterization in August of 2013  . Obesity   . Sleep apnea     Patient Active Problem List   Diagnosis Date Noted  . Generalized anxiety disorder 02/02/2017  . Other fatigue 02/02/2017  . Abnormal results of liver function studies 02/02/2017  . Myalgia 02/02/2017  . Edema, unspecified 02/02/2017  . Peripheral vascular disease  ([place]) 02/02/2017  . Circadian rhythm sleep disorder, shift work type 02/02/2017  . Acne vulgaris 02/02/2017  . Occlusion and stenosis of bilateral carotid arteries 02/02/2017  . Abnormal weight gain 02/02/2017  . Pain in unspecified knee 02/02/2017  . Neoplasm of uncertain behavior of skin 02/02/2017  . Hypersomnia, unspecified 02/02/2017  . Shortness of breath 02/02/2017  . Morbid obesity ([place]) 07/07/2016  . Chest pain 06/08/2016  . Other chest pain 05/22/2014  . Mass of jaw 05/22/2014  . Right flank pain 11/23/2013  . Dysuria 11/23/2013  . Encounter for wellness examination 11/08/2013  . Retaining fluid 11/08/2013  . Urinary incontinence 03/14/2013  . Depression 01/13/2013  . Type 2 diabetes mellitus ([place]) 09/14/2012  . Nonischemic cardiomyopathy ([place])   . Mixed hyperlipidemia 05/28/2012  . CAD (coronary artery disease) 03/30/2012  . Chronic pain syndrome 03/30/2012  . HTN (hypertension) 03/30/2012    Past Surgical History:  Procedure Laterality Date  . CARDIAC CATHETERIZATION N/A 09/2011   [facility]; EF 50% with 30% mid LAD stenosis and no obstructive disease.  Marland Kitchen CARDIAC CATHETERIZATION  09/2010   [facility]; Mid LAD 40% stenosis; Mid Circumflex:Normal; Mid RCA; Normal  . CARDIAC CATHETERIZATION    . COLONOSCOPY    . KNEE ARTHROSCOPY WITH MEDIAL MENISECTOMY Right 09/16/2012   Procedure: RIGHT KNEE ARTHROSCOPY WITH MEDIAL AND LATERAL MENISECTOMY, CHONDROPLASTY;  Surgeon: Ninetta Lights, MD;  Location: [place];  Service: Orthopedics;  Laterality: Right;  RIGHT KNEE SCOPE MEDIAL MENISCECTOMY  . LEFT HEART CATH  AND CORONARY ANGIOGRAPHY N/A 06/11/2016   Procedure: Left Heart Cath and Coronary Angiography;  Surgeon: Minna Merritts, MD;  Location: [place] [facility];  Service: Cardiovascular;  Laterality: N/A;    Prior to Admission medications   Medication Sig Start Date End Date Taking? Authorizing Provider  albuterol (PROVENTIL HFA) 108 (90 BASE) MCG/ACT inhaler  Inhale 2 puffs into the lungs every 4 (four) hours as needed for wheezing or shortness of breath. 12/30/14   Carrie Mew, MD  aspirin EC 81 MG tablet Take 81 mg by mouth daily.    [provider]  chlorpheniramine-HYDROcodone (TUSSIONEX PENNKINETIC ER) 10-8 MG/5ML SUER Take 5 mLs by mouth 2 (two) times daily. 01/29/17   Ronnell Freshwater, NP  Cholecalciferol (VITAMIN D3) 5000 UNITS CAPS Take 5,000 Units by mouth daily.    [provider]  citalopram (CELEXA) 20 MG tablet Take 1 tablet (20 mg total) by mouth daily. 01/31/14   Jearld Fenton, NP  diazepam (VALIUM) 5 MG tablet Take 1 tablet (5 mg total) by mouth every 8 (eight) hours as needed for muscle spasms. 01/10/17   [name], MD  Dulaglutide (TRULICITY) 1.5 ZO/1.0RU SOPN Inject 1.5 mg into the skin once a week.    [provider]  guaiFENesin (MUCINEX) 600 MG 12 hr tablet Take 1 tablet (600 mg total) by mouth 2 (two) times daily as needed for cough or to loosen phlegm. 01/29/17   Ronnell Freshwater, NP  ibuprofen (ADVIL,MOTRIN) 800 MG tablet Take 1 tablet (800 mg total) by mouth every 8 (eight) hours as needed. 01/10/17   [name], MD  insulin detemir (LEVEMIR) 100 UNIT/ML injection Inject 26 Units into the skin every morning.    [provider]  lisinopril-hydrochlorothiazide (PRINZIDE,ZESTORETIC) 20-25 MG tablet Take 1 tablet by mouth daily. 05/25/16   [provider]  nitroGLYCERIN (NITROSTAT) 0.4 MG SL tablet Place 1 tablet (0.4 mg total) under the tongue every 5 (five) minutes as needed for chest pain. 11/08/13   Lucille Passy, MD  omeprazole (PRILOSEC) 40 MG capsule TAKE 1 CAPSULE (40 MG TOTAL) BY MOUTH DAILY. 04/29/16   Irene Shipper, MD  ondansetron (ZOFRAN ODT) 4 MG disintegrating tablet Take 1 tablet (4 mg total) by mouth every 8 (eight) hours as needed for nausea or vomiting. 12/01/15   Rudene Re, MD  potassium chloride SA (K-DUR,KLOR-CON) 20 MEQ tablet Take 1  tablet (20 mEq total) by mouth daily. 11/08/13   Lucille Passy, MD  sulfamethoxazole-trimethoprim (BACTRIM DS,SEPTRA DS) 800-160 MG tablet Take 1 tablet by mouth 2 (two) times daily. 01/29/17   Ronnell Freshwater, NP  traMADol (ULTRAM) 50 MG tablet Take 1 tablet (50 mg total) by mouth every 6 (six) hours as needed. 01/10/17 01/10/18  [name], MD    Allergies Statins  Family History  Problem Relation Age of Onset  . Heart disease Father 80       MI  . Heart attack Father   . Stomach cancer Father   . Hypertension Mother   . Breast cancer Mother     Social History Social History   Tobacco Use  . Smoking status: Never Smoker  . Smokeless tobacco: Never Used  Substance Use Topics  . Alcohol use: Yes    Alcohol/week: 0.0 oz    Comment: rare  . Drug use: No    Review of Systems Constitutional: No fever/chills Eyes: No visual changes. ENT: No sore throat. Cardiovascular: Denies chest pain.  Respiratory: Positive for shortness of breath. Positive for cough. Gastrointestinal: No abdominal pain.  No nausea, no vomiting.  No diarrhea.   Genitourinary: Negative for dysuria. Musculoskeletal: Negative for back pain. Skin: Negative for rash. Neurological: Negative for headaches, focal weakness or numbness.  ____________________________________________   PHYSICAL EXAM:  VITAL SIGNS: ED Triage Vitals  Enc Vitals Group     BP 02/25/17 1542 (!) 151/90     Pulse Rate 02/25/17 1542 82     Resp 02/25/17 1542 17     Temp 02/25/17 1542 97.8 F (36.6 C)     Temp Source 02/25/17 1542 Oral     SpO2 02/25/17 1542 98 %     Weight 02/25/17 1539 (!) 330 lb (149.7 kg)     Height 02/25/17 1539 6\' 2"  (1.88 m)     Head Circumference --      Peak Flow --      Pain Score 02/25/17 1539 7    Constitutional: Alert and oriented. Well appearing and in no distress. Eyes: Conjunctivae are normal.  ENT   Head: Normocephalic and atraumatic.   Nose: No congestion/rhinnorhea.    Mouth/Throat: Mucous membranes are moist.   Neck: No stridor. Hematological/Lymphatic/Immunilogical: No cervical lymphadenopathy. Cardiovascular: Normal rate, regular rhythm.  No murmurs, rubs, or gallops.  Respiratory: Normal respiratory effort without tachypnea nor retractions. Breath sounds are clear and equal bilaterally. No wheezes/rales/rhonchi. Gastrointestinal: Soft and non tender. No rebound. No guarding.  Genitourinary: Deferred Musculoskeletal: Normal range of motion in all extremities. No lower extremity edema. Neurologic:  Normal speech and language. No gross focal neurologic deficits are appreciated.  Skin:  Skin is warm, dry and intact. No rash noted. Psychiatric: Mood and affect are normal. Speech and behavior are normal. Patient exhibits appropriate insight and judgment.  ____________________________________________    LABS (pertinent positives/negatives)  Trop <0.03 CBC wnl BMP glu 280  ____________________________________________   EKG  I, Nance Pear, attending physician, personally viewed and interpreted this EKG  EKG Time: 1541 Rate: 80 Rhythm: normal sinus rhythm Axis: normal Intervals: qtc 470 QRS: q waves v1, v2, v3 ST changes: no st elevation Impression: abnormal ekg   ____________________________________________    RADIOLOGY  CXR No acute disease  ____________________________________________   PROCEDURES  Procedures  ____________________________________________   INITIAL IMPRESSION / ASSESSMENT AND PLAN / ED COURSE  Pertinent labs & imaging results that were available during my care of the patient were reviewed by me and considered in my medical decision making (see chart for details).  Patient presents to the emergency department today because of concerns for cough and shortness of breath as well as some body aches.  Also complaining of some throat discomfort.  Differential would be primarily for viral upper respiratory  infection, influenza, possibly strep given the sore throat amongst other etiologies.  Patient certainly does not look toxic on exam.  Influenza and strep both negative.  Patient did feel better after DuoNeb treatment.  At this point I do think viral upper respiratory infection likely.  Do not feel any further emergent workup is necessary at this time.  Will plan on discharging with cough medication as well as albuterol inhaler.  Discussed findings and plan with patient.   ____________________________________________   FINAL CLINICAL IMPRESSION(S) / ED DIAGNOSES  Final diagnoses:  Cough  Viral URI with cough     Note: This dictation was prepared with Dragon dictation. Any transcriptional errors that result from this process are unintentional     Nance Pear, MD 02/25/17 [phone]

## 2017-02-25 NOTE — ED Triage Notes (Signed)
Pt c/o shortness of breath that started today with nonproductive cough for 3-4 days - also c/o body aches

## 2017-02-25 NOTE — ED Notes (Signed)
PT in NAD at time of departure, VS stable, pt ambulatory. PT verbalizes d.c understanding and rx.

## 2017-03-05 ENCOUNTER — Ambulatory Visit: Payer: Self-pay | Admitting: Nurse Practitioner

## 2017-03-17 DIAGNOSIS — G4733 Obstructive sleep apnea (adult) (pediatric): Secondary | ICD-10-CM | POA: Diagnosis not present

## 2017-04-20 DIAGNOSIS — G8929 Other chronic pain: Secondary | ICD-10-CM | POA: Diagnosis not present

## 2017-04-20 DIAGNOSIS — M7731 Calcaneal spur, right foot: Secondary | ICD-10-CM | POA: Diagnosis not present

## 2017-04-20 DIAGNOSIS — M7661 Achilles tendinitis, right leg: Secondary | ICD-10-CM | POA: Diagnosis not present

## 2017-04-20 DIAGNOSIS — M79671 Pain in right foot: Secondary | ICD-10-CM | POA: Diagnosis not present

## 2017-04-23 DIAGNOSIS — G4733 Obstructive sleep apnea (adult) (pediatric): Secondary | ICD-10-CM | POA: Diagnosis not present

## 2017-05-05 DIAGNOSIS — M7731 Calcaneal spur, right foot: Secondary | ICD-10-CM | POA: Diagnosis not present

## 2017-05-05 DIAGNOSIS — M7661 Achilles tendinitis, right leg: Secondary | ICD-10-CM | POA: Diagnosis not present

## 2017-05-05 DIAGNOSIS — G8929 Other chronic pain: Secondary | ICD-10-CM | POA: Diagnosis not present

## 2017-05-05 DIAGNOSIS — M79671 Pain in right foot: Secondary | ICD-10-CM | POA: Diagnosis not present

## 2017-05-07 ENCOUNTER — Ambulatory Visit: Payer: Self-pay | Admitting: Internal Medicine

## 2017-05-19 DIAGNOSIS — M7661 Achilles tendinitis, right leg: Secondary | ICD-10-CM | POA: Diagnosis not present

## 2017-05-19 DIAGNOSIS — M7731 Calcaneal spur, right foot: Secondary | ICD-10-CM | POA: Diagnosis not present

## 2017-05-19 DIAGNOSIS — M79671 Pain in right foot: Secondary | ICD-10-CM | POA: Diagnosis not present

## 2017-05-25 DIAGNOSIS — G4733 Obstructive sleep apnea (adult) (pediatric): Secondary | ICD-10-CM | POA: Diagnosis not present

## 2017-05-26 ENCOUNTER — Ambulatory Visit: Payer: Self-pay | Admitting: Nurse Practitioner

## 2017-05-26 ENCOUNTER — Encounter: Payer: Self-pay | Admitting: Nurse Practitioner

## 2017-05-26 ENCOUNTER — Ambulatory Visit: Payer: 59 | Admitting: Nurse Practitioner

## 2017-05-26 VITALS — BP 117/88 | HR 89 | Temp 97.8°F | Resp 16 | Ht 74.0 in | Wt 329.0 lb

## 2017-05-26 DIAGNOSIS — I10 Essential (primary) hypertension: Secondary | ICD-10-CM

## 2017-05-26 DIAGNOSIS — R300 Dysuria: Secondary | ICD-10-CM

## 2017-05-26 DIAGNOSIS — J019 Acute sinusitis, unspecified: Secondary | ICD-10-CM

## 2017-05-26 DIAGNOSIS — E782 Mixed hyperlipidemia: Secondary | ICD-10-CM

## 2017-05-26 DIAGNOSIS — N390 Urinary tract infection, site not specified: Secondary | ICD-10-CM | POA: Diagnosis not present

## 2017-05-26 LAB — POCT URINALYSIS DIPSTICK
Glucose, UA: NEGATIVE
KETONES UA: NEGATIVE
NITRITE UA: NEGATIVE
PROTEIN UA: 30
SPEC GRAV UA: 1.02 (ref 1.010–1.025)
UROBILINOGEN UA: 0.2 U/dL
pH, UA: 6 (ref 5.0–8.0)

## 2017-05-26 MED ORDER — SULFAMETHOXAZOLE-TRIMETHOPRIM 800-160 MG PO TABS: 1.0000 | ORAL_TABLET | Freq: Two times a day (BID) | ORAL | 0 refills | Status: DC

## 2017-05-26 NOTE — Progress Notes (Signed)
[facility] [address], [place] [address]  Internal MEDICINE  Office Visit Note  Patient Name: Gerald [name]  [id number]  [id number]  Date of Service: 06/14/2017   Pt is here for a sick visit.  Chief Complaint  Patient presents with  . Sinusitis  . Urinary Tract Infection     Sinusitis  This is a new problem. The current episode started in the past 7 days. The problem is unchanged. There has been no fever. His pain is at a severity of 3/10. The pain is mild. Associated symptoms include chills, congestion, coughing, headaches, sinus pressure, sneezing, a sore throat and swollen glands. Pertinent negatives include no neck pain or shortness of breath. Past treatments include acetaminophen, lying down and oral decongestants. The treatment provided mild relief.  Urinary Tract Infection   This is a new problem. The current episode started in the past 7 days. The problem occurs intermittently. The problem has been unchanged. The quality of the pain is described as aching and burning. The pain is at a severity of 2/10. The pain is mild. There has been no fever. The fever has been present for 1 - 2 days. He is sexually active. There is no history of pyelonephritis. Associated symptoms include chills, frequency, hesitancy and urgency. Pertinent negatives include no nausea or vomiting. He has tried increased fluids for the symptoms. The treatment provided mild relief.        Current Medication:  Outpatient Encounter Medications as of 05/26/2017  Medication Sig Note  . albuterol (PROVENTIL HFA;VENTOLIN HFA) 108 (90 Base) MCG/ACT inhaler Inhale 2 puffs into the lungs every 6 (six) hours as needed for wheezing or shortness of breath.   . Dulaglutide (TRULICITY) 1.5 QV/9.5GL SOPN Inject 1.5 mg into the skin once a week. 06/08/2016: TUESDAY  . guaiFENesin (MUCINEX) 600 MG 12 hr tablet Take 1 tablet (600 mg total) by mouth 2 (two) times daily as needed for cough or to  loosen phlegm.   Marland Kitchen lisinopril-hydrochlorothiazide (PRINZIDE,ZESTORETIC) 20-25 MG tablet Take 1 tablet by mouth daily.   . [EXPIRED] meloxicam (MOBIC) 15 MG tablet Take by mouth.   . ondansetron (ZOFRAN ODT) 4 MG disintegrating tablet Take 1 tablet (4 mg total) by mouth every 8 (eight) hours as needed for nausea or vomiting.   . [DISCONTINUED] aspirin EC 81 MG tablet Take 81 mg by mouth daily.   . [DISCONTINUED] Cholecalciferol (VITAMIN D3) 5000 UNITS CAPS Take 5,000 Units by mouth daily.   . [DISCONTINUED] furosemide (LASIX) 20 MG tablet Take 20 mg by mouth daily.    . [DISCONTINUED] ibuprofen (ADVIL,MOTRIN) 800 MG tablet Take 1 tablet (800 mg total) by mouth every 8 (eight) hours as needed. (Patient taking differently: Take 800 mg by mouth every 8 (eight) hours as needed for fever or moderate pain. )   . [DISCONTINUED] omeprazole (PRILOSEC) 40 MG capsule TAKE 1 CAPSULE (40 MG TOTAL) BY MOUTH DAILY. (Patient not taking: Reported on 06/10/2017)   . albuterol (PROVENTIL HFA) 108 (90 BASE) MCG/ACT inhaler Inhale 2 puffs into the lungs every 4 (four) hours as needed for wheezing or shortness of breath. (Patient not taking: Reported on 05/26/2017)   . citalopram (CELEXA) 40 MG tablet TAKE 1 TABLET BY MOUTH EVERY DAY FOR GAD   . nitroGLYCERIN (NITROSTAT) 0.4 MG SL tablet Place 1 tablet (0.4 mg total) under the tongue every 5 (five) minutes as needed for chest pain. (Patient not taking: Reported on 06/10/2017)   . [DISCONTINUED] benzonatate (TESSALON PERLES)  100 MG capsule Take 1 capsule (100 mg total) by mouth every 6 (six) hours as needed for cough. (Patient not taking: Reported on 05/26/2017)   . [DISCONTINUED] chlorpheniramine-HYDROcodone (TUSSIONEX PENNKINETIC ER) 10-8 MG/5ML SUER Take 5 mLs by mouth 2 (two) times daily. (Patient not taking: Reported on 05/26/2017)   . [DISCONTINUED] citalopram (CELEXA) 20 MG tablet Take 1 tablet (20 mg total) by mouth daily. (Patient not taking: Reported on 05/26/2017)   .  [DISCONTINUED] diazepam (VALIUM) 5 MG tablet Take 1 tablet (5 mg total) by mouth every 8 (eight) hours as needed for muscle spasms. (Patient not taking: Reported on 05/26/2017)   . [DISCONTINUED] insulin detemir (LEVEMIR) 100 UNIT/ML injection Inject 26 Units into the skin every morning.   . [DISCONTINUED] potassium chloride SA (K-DUR,KLOR-CON) 20 MEQ tablet Take 1 tablet (20 mEq total) by mouth daily. (Patient not taking: Reported on 05/26/2017)   . [DISCONTINUED] sulfamethoxazole-trimethoprim (BACTRIM DS,SEPTRA DS) 800-160 MG tablet Take 1 tablet by mouth 2 (two) times daily. (Patient not taking: Reported on 05/26/2017)   . [DISCONTINUED] sulfamethoxazole-trimethoprim (BACTRIM DS,SEPTRA DS) 800-160 MG tablet Take 1 tablet by mouth 2 (two) times daily. (Patient not taking: Reported on 06/10/2017)   . [DISCONTINUED] traMADol (ULTRAM) 50 MG tablet Take 1 tablet (50 mg total) by mouth every 6 (six) hours as needed. (Patient not taking: Reported on 05/26/2017)    No facility-administered encounter medications on file as of 05/26/2017.       Medical History: Past Medical History:  Diagnosis Date  . Abnormal LFTs   . Coronary artery disease    Mild nonobstructive coronary artery disease on previous cardiac catheterization most recently in August of 2013  . Diabetes mellitus without complication (HCC)    borderline  . Fundic gland polyps of stomach, benign   . Gastritis   . GERD (gastroesophageal reflux disease)   . Hepatic steatosis   . Hyperlipidemia   . Hypertension   . Myocardial infarction ([place]) 09/2011  . Nonischemic cardiomyopathy (HCC)    Previous ejection fraction of 35-40% on echo. 50% on cardiac catheterization in August of 2013  . Obesity   . Sleep apnea     Today's Vitals   05/26/17 1158  BP: 117/88  Pulse: 89  Resp: 16  Temp: 97.8 F (36.6 C)  SpO2: 94%  Weight: (!) 329 lb (149.2 kg)  Height: 6\' 2"  (1.88 m)    Review of Systems  Constitutional: Positive for chills and  fatigue. Negative for activity change and unexpected weight change.  HENT: Positive for congestion, postnasal drip, sinus pressure, sneezing, sore throat and voice change. Negative for rhinorrhea.   Eyes: Negative.  Negative for redness.  Respiratory: Positive for cough. Negative for chest tightness, shortness of breath and wheezing.   Cardiovascular: Negative for chest pain and palpitations.  Gastrointestinal: Negative for abdominal pain, constipation, diarrhea, nausea and vomiting.  Endocrine: Negative for cold intolerance, heat intolerance, polydipsia, polyphagia and polyuria.  Genitourinary: Positive for decreased urine volume, frequency, hesitancy and urgency. Negative for dysuria and testicular pain.  Musculoskeletal: Positive for back pain. Negative for arthralgias, joint swelling and neck pain.  Skin: Negative for rash.  Allergic/Immunologic: Positive for environmental allergies.  Neurological: Positive for dizziness and headaches. Negative for tremors and numbness.  Hematological: Negative for adenopathy. Does not bruise/bleed easily.  Psychiatric/Behavioral: Negative for behavioral problems (Depression), sleep disturbance and suicidal ideas. The patient is not nervous/anxious.     Physical Exam  Constitutional: He is oriented to person, place, and time. He  appears well-developed and well-nourished. No distress.  HENT:  Head: Normocephalic and atraumatic.  Right Ear: Tympanic membrane is erythematous and bulging.  Left Ear: Tympanic membrane is erythematous and bulging.  Nose: Rhinorrhea present. Right sinus exhibits frontal sinus tenderness. Left sinus exhibits frontal sinus tenderness.  Mouth/Throat: Posterior oropharyngeal erythema present. No oropharyngeal exudate.  Eyes: Pupils are equal, round, and reactive to light. EOM are normal.  Neck: Normal range of motion. Neck supple. No JVD present. No tracheal deviation present. No thyromegaly present.  Cardiovascular: Normal rate,  regular rhythm and normal heart sounds. Exam reveals no gallop and no friction rub.  No murmur heard. Pulmonary/Chest: Effort normal and breath sounds normal. No respiratory distress. He has no wheezes. He has no rales. He exhibits no tenderness.  Congested, non-productive cough present.   Abdominal: Soft. Bowel sounds are normal. There is no tenderness.  Genitourinary:  Genitourinary Comments: Tace WBC and blood in urine. Moderate protein in urine.   Musculoskeletal: Normal range of motion.  Lymphadenopathy:    He has cervical adenopathy.  Neurological: He is alert and oriented to person, place, and time. No cranial nerve deficit.  Skin: Skin is warm and dry. He is not diaphoretic.  Psychiatric: He has a normal mood and affect. His behavior is normal. Judgment and thought content normal.  Nursing note and vitals reviewed.  Assessment/Plan:   1. Urinary tract infection without hematuria, site unspecified Treat with Bactrim DS bid for 10 days. Will adjust abx based on results of culture and sensitivity.   2. Dysuria Treat for UTI. Check psa and refer to urology as indicated.  - POCT Urinalysis Dipstick - CULTURE, URINE COMPREHENSIVE - PSA  3. Acute non-recurrent sinusitis, unspecified location Bactrim DS bid for 10 days. Recommend OTC medication to alleviate symptoms.   4. Essential hypertension Generally stable. Check routine, fasting labs.  - CBC with Differential/Platelet - Comprehensive metabolic panel - Lipid panel - TSH  5. Mixed hyperlipidemia Routine, fasting labs ordered.  - Lipid panel - TSH   General Counseling: Sara verbalizes understanding of the findings of todays visit and agrees with plan of treatment. I have discussed any further diagnostic evaluation that may be needed or ordered today. We also reviewed his medications today. he has been encouraged to call the office with any questions or concerns that should arise related to todays visit.  Rest and  increase fluids. Continue using OTC medication to control symptoms.   This patient was seen by Leretha Pol, FNP- C in Collaboration with Dr Lavera Guise as a part of collaborative care agreement   Orders Placed This Encounter  Procedures  . CULTURE, URINE COMPREHENSIVE  . PSA  . CBC with Differential/Platelet  . Comprehensive metabolic panel  . Lipid panel  . TSH  . POCT Urinalysis Dipstick    Meds ordered this encounter  Medications  . DISCONTD: sulfamethoxazole-trimethoprim (BACTRIM DS,SEPTRA DS) 800-160 MG tablet    Sig: Take 1 tablet by mouth 2 (two) times daily.    Dispense:  20 tablet    Refill:  0    Order Specific Question:   Supervising Provider    Answer:   Lavera Guise [[id number]]    Time spent: 25 Minutes

## 2017-05-29 LAB — CULTURE, URINE COMPREHENSIVE

## 2017-06-08 ENCOUNTER — Other Ambulatory Visit: Payer: Self-pay | Admitting: Nurse Practitioner

## 2017-06-08 ENCOUNTER — Telehealth: Payer: Self-pay

## 2017-06-08 DIAGNOSIS — R059 Cough, unspecified: Secondary | ICD-10-CM

## 2017-06-08 DIAGNOSIS — R05 Cough: Secondary | ICD-10-CM

## 2017-06-08 DIAGNOSIS — J069 Acute upper respiratory infection, unspecified: Secondary | ICD-10-CM

## 2017-06-08 MED ORDER — BENZONATATE 200 MG PO CAPS: 200.0000 mg | ORAL_CAPSULE | Freq: Three times a day (TID) | ORAL | 0 refills | Status: DC | PRN

## 2017-06-08 MED ORDER — LEVOFLOXACIN 500 MG PO TABS: 500.0000 mg | ORAL_TABLET | Freq: Every day | ORAL | 0 refills | Status: DC

## 2017-06-08 NOTE — Telephone Encounter (Signed)
Patient called stating still feeling ill. Sent in levofloxacin 500mg  daily for 10 days. Add tessalon perls 200mg  up to three times daily if needed for cough. Continue to use otc medications to help relieve symptoms.

## 2017-06-08 NOTE — Progress Notes (Signed)
Patient called stating still feeling ill. Sent in levofloxacin 500mg  daily for 10 days. Add tessalon perls 200mg  up to three times daily if needed for cough. Continue to use otc medications to help relieve symptoms.

## 2017-06-08 NOTE — Telephone Encounter (Signed)
PT ADVISED WE SEND ANTIBIOTIC TO  YOUR PHAR

## 2017-06-10 ENCOUNTER — Emergency Department: Payer: 59

## 2017-06-10 ENCOUNTER — Observation Stay: Payer: 59 | Admitting: Anesthesiology

## 2017-06-10 ENCOUNTER — Observation Stay
Admission: EM | Admit: 2017-06-10 | Discharge: 2017-06-11 | Disposition: A | Discharge status: Home / Self Care | Payer: 59 | Attending: Internal Medicine | Admitting: Internal Medicine

## 2017-06-10 ENCOUNTER — Other Ambulatory Visit: Payer: Self-pay

## 2017-06-10 ENCOUNTER — Encounter
Admission: EM | Disposition: A | Discharge status: Home / Self Care | Payer: Self-pay | Source: Home / Self Care | Attending: Emergency Medicine

## 2017-06-10 ENCOUNTER — Encounter: Payer: Self-pay | Admitting: Emergency Medicine

## 2017-06-10 DIAGNOSIS — Z888 Allergy status to other drugs, medicaments and biological substances status: Secondary | ICD-10-CM | POA: Diagnosis not present

## 2017-06-10 DIAGNOSIS — K2950 Unspecified chronic gastritis without bleeding: Secondary | ICD-10-CM | POA: Insufficient documentation

## 2017-06-10 DIAGNOSIS — I2510 Atherosclerotic heart disease of native coronary artery without angina pectoris: Secondary | ICD-10-CM | POA: Diagnosis not present

## 2017-06-10 DIAGNOSIS — E119 Type 2 diabetes mellitus without complications: Secondary | ICD-10-CM | POA: Insufficient documentation

## 2017-06-10 DIAGNOSIS — I252 Old myocardial infarction: Secondary | ICD-10-CM | POA: Insufficient documentation

## 2017-06-10 DIAGNOSIS — K259 Gastric ulcer, unspecified as acute or chronic, without hemorrhage or perforation: Secondary | ICD-10-CM

## 2017-06-10 DIAGNOSIS — R079 Chest pain, unspecified: Secondary | ICD-10-CM

## 2017-06-10 DIAGNOSIS — Z7982 Long term (current) use of aspirin: Secondary | ICD-10-CM | POA: Diagnosis not present

## 2017-06-10 DIAGNOSIS — I25110 Atherosclerotic heart disease of native coronary artery with unstable angina pectoris: Secondary | ICD-10-CM | POA: Diagnosis not present

## 2017-06-10 DIAGNOSIS — I10 Essential (primary) hypertension: Secondary | ICD-10-CM

## 2017-06-10 DIAGNOSIS — Z794 Long term (current) use of insulin: Secondary | ICD-10-CM | POA: Insufficient documentation

## 2017-06-10 DIAGNOSIS — G4733 Obstructive sleep apnea (adult) (pediatric): Secondary | ICD-10-CM | POA: Diagnosis not present

## 2017-06-10 DIAGNOSIS — K922 Gastrointestinal hemorrhage, unspecified: Secondary | ICD-10-CM

## 2017-06-10 DIAGNOSIS — K2970 Gastritis, unspecified, without bleeding: Secondary | ICD-10-CM | POA: Diagnosis not present

## 2017-06-10 DIAGNOSIS — R0789 Other chest pain: Secondary | ICD-10-CM | POA: Diagnosis present

## 2017-06-10 DIAGNOSIS — Z6841 Body Mass Index (BMI) 40.0 and over, adult: Secondary | ICD-10-CM | POA: Diagnosis not present

## 2017-06-10 DIAGNOSIS — Z79899 Other long term (current) drug therapy: Secondary | ICD-10-CM | POA: Diagnosis not present

## 2017-06-10 DIAGNOSIS — E785 Hyperlipidemia, unspecified: Secondary | ICD-10-CM | POA: Diagnosis not present

## 2017-06-10 DIAGNOSIS — K219 Gastro-esophageal reflux disease without esophagitis: Secondary | ICD-10-CM | POA: Diagnosis not present

## 2017-06-10 DIAGNOSIS — I200 Unstable angina: Secondary | ICD-10-CM | POA: Diagnosis present

## 2017-06-10 DIAGNOSIS — K254 Chronic or unspecified gastric ulcer with hemorrhage: Secondary | ICD-10-CM | POA: Insufficient documentation

## 2017-06-10 DIAGNOSIS — K920 Hematemesis: Secondary | ICD-10-CM

## 2017-06-10 DIAGNOSIS — I25118 Atherosclerotic heart disease of native coronary artery with other forms of angina pectoris: Secondary | ICD-10-CM | POA: Diagnosis not present

## 2017-06-10 HISTORY — PX: ESOPHAGOGASTRODUODENOSCOPY (EGD) WITH PROPOFOL: SHX5813

## 2017-06-10 LAB — CBC WITH DIFFERENTIAL/PLATELET
Basophils Absolute: 0 10*3/uL (ref 0–0.1)
Basophils Relative: 1 %
EOS ABS: 0.1 10*3/uL (ref 0–0.7)
EOS PCT: 2 %
HCT: 38.3 % — ABNORMAL LOW (ref 40.0–52.0)
Hemoglobin: 13.1 g/dL (ref 13.0–18.0)
LYMPHS ABS: 2.7 10*3/uL (ref 1.0–3.6)
LYMPHS PCT: 36 %
MCH: 29.5 pg (ref 26.0–34.0)
MCHC: 34.2 g/dL (ref 32.0–36.0)
MCV: 86.1 fL (ref 80.0–100.0)
MONO ABS: 0.6 10*3/uL (ref 0.2–1.0)
MONOS PCT: 8 %
Neutro Abs: 4 10*3/uL (ref 1.4–6.5)
Neutrophils Relative %: 53 %
PLATELETS: 230 10*3/uL (ref 150–440)
RBC: 4.45 MIL/uL (ref 4.40–5.90)
RDW: 14.1 % (ref 11.5–14.5)
WBC: 7.5 10*3/uL (ref 3.8–10.6)

## 2017-06-10 LAB — GLUCOSE, CAPILLARY
Glucose-Capillary: 357 mg/dL — ABNORMAL HIGH (ref 65–99)
Glucose-Capillary: 288 mg/dL — ABNORMAL HIGH (ref 65–99)
Glucose-Capillary: 234 mg/dL — ABNORMAL HIGH (ref 65–99)
Glucose-Capillary: 204 mg/dL — ABNORMAL HIGH (ref 65–99)

## 2017-06-10 LAB — COMPREHENSIVE METABOLIC PANEL
ALK PHOS: 107 U/L (ref 38–126)
ALT: 67 U/L — AB (ref 17–63)
AST: 50 U/L — ABNORMAL HIGH (ref 15–41)
Albumin: 4 g/dL (ref 3.5–5.0)
Anion gap: 9 (ref 5–15)
BILIRUBIN TOTAL: 0.6 mg/dL (ref 0.3–1.2)
BUN: 17 mg/dL (ref 6–20)
CALCIUM: 8.9 mg/dL (ref 8.9–10.3)
CO2: 24 mmol/L (ref 22–32)
CREATININE: 1.1 mg/dL (ref 0.61–1.24)
Chloride: 98 mmol/L — ABNORMAL LOW (ref 101–111)
GFR calc non Af Amer: >60 mL/min (ref >60)
GLUCOSE: 359 mg/dL — AB (ref 65–99)
Potassium: 4 mmol/L (ref 3.5–5.1)
SODIUM: 131 mmol/L — AB (ref 135–145)
TOTAL PROTEIN: 7.5 g/dL (ref 6.5–8.1)

## 2017-06-10 LAB — CBC
HEMATOCRIT: 41.2 % (ref 40.0–52.0)
HEMOGLOBIN: 14.2 g/dL (ref 13.0–18.0)
MCH: 29.7 pg (ref 26.0–34.0)
MCHC: 34.4 g/dL (ref 32.0–36.0)
MCV: 86.4 fL (ref 80.0–100.0)
Platelets: 229 10*3/uL (ref 150–440)
RBC: 4.77 MIL/uL (ref 4.40–5.90)
RDW: 14 % (ref 11.5–14.5)
WBC: 7.7 10*3/uL (ref 3.8–10.6)

## 2017-06-10 LAB — TROPONIN I
Troponin I: <0.03 ng/mL (ref <0.03)
Troponin I: <0.03 ng/mL (ref <0.03)
Troponin I: <0.03 ng/mL (ref <0.03)
Troponin I: <0.03 ng/mL (ref <0.03)

## 2017-06-10 LAB — TSH: TSH: 10.618 u[IU]/mL — ABNORMAL HIGH (ref 0.350–4.500)

## 2017-06-10 LAB — HEMOGLOBIN A1C
Hgb A1c MFr Bld: 11 % — ABNORMAL HIGH (ref 4.8–5.6)
Mean Plasma Glucose: 269 mg/dL

## 2017-06-10 LAB — LIPASE, BLOOD: LIPASE: 20 U/L (ref 11–51)

## 2017-06-10 LAB — HEMOGLOBIN: Hemoglobin: 13.4 g/dL (ref 13.0–18.0)

## 2017-06-10 SURGERY — ESOPHAGOGASTRODUODENOSCOPY (EGD) WITH PROPOFOL
Anesthesia: General

## 2017-06-10 MED ORDER — LEVOFLOXACIN 500 MG PO TABS
500.0000 mg | ORAL_TABLET | Freq: Every day | ORAL | Status: DC
  Administered 2017-06-10 – 2017-06-11 (×2): 500 mg via ORAL
  Filled 2017-06-10 (×2): qty 1

## 2017-06-10 MED ORDER — PROPOFOL 500 MG/50ML IV EMUL
INTRAVENOUS | Status: DC | PRN
  Administered 2017-06-10: 175 ug/kg/min via INTRAVENOUS

## 2017-06-10 MED ORDER — DOCUSATE SODIUM 100 MG PO CAPS
100.0000 mg | ORAL_CAPSULE | Freq: Two times a day (BID) | ORAL | Status: DC
  Administered 2017-06-10: 100 mg via ORAL
  Filled 2017-06-10 (×2): qty 1

## 2017-06-10 MED ORDER — NITROGLYCERIN 0.4 MG SL SUBL
0.4000 mg | SUBLINGUAL_TABLET | SUBLINGUAL | Status: DC | PRN
  Administered 2017-06-10 (×2): 0.4 mg via SUBLINGUAL
  Filled 2017-06-10: qty 1

## 2017-06-10 MED ORDER — PROPOFOL 10 MG/ML IV BOLUS
INTRAVENOUS | Status: DC | PRN
  Administered 2017-06-10: 100 mg via INTRAVENOUS

## 2017-06-10 MED ORDER — INSULIN ASPART 100 UNIT/ML ~~LOC~~ SOLN
0.0000 [IU] | Freq: Every day | SUBCUTANEOUS | Status: DC
  Administered 2017-06-10: 2 [IU] via SUBCUTANEOUS
  Filled 2017-06-10: qty 1

## 2017-06-10 MED ORDER — ASPIRIN 81 MG PO CHEW
162.0000 mg | CHEWABLE_TABLET | Freq: Once | ORAL | Status: AC
  Administered 2017-06-10: 162 mg via ORAL
  Filled 2017-06-10: qty 2

## 2017-06-10 MED ORDER — GUAIFENESIN ER 600 MG PO TB12: 600.0000 mg | ORAL_TABLET | Freq: Two times a day (BID) | ORAL | Status: DC | PRN

## 2017-06-10 MED ORDER — LISINOPRIL-HYDROCHLOROTHIAZIDE 20-25 MG PO TABS: 1.0000 | ORAL_TABLET | Freq: Every day | ORAL | Status: DC

## 2017-06-10 MED ORDER — ONDANSETRON HCL 4 MG/2ML IJ SOLN
4.0000 mg | Freq: Four times a day (QID) | INTRAMUSCULAR | Status: DC | PRN
  Administered 2017-06-10 (×2): 4 mg via INTRAVENOUS
  Filled 2017-06-10 (×2): qty 2

## 2017-06-10 MED ORDER — MORPHINE SULFATE (PF) 2 MG/ML IV SOLN
INTRAVENOUS | Status: AC
  Administered 2017-06-10: 2 mg via INTRAVENOUS
  Filled 2017-06-10: qty 1

## 2017-06-10 MED ORDER — ASPIRIN EC 81 MG PO TBEC: 81.0000 mg | DELAYED_RELEASE_TABLET | Freq: Every day | ORAL | Status: DC

## 2017-06-10 MED ORDER — FENTANYL CITRATE (PF) 100 MCG/2ML IJ SOLN
75.0000 ug | Freq: Once | INTRAMUSCULAR | Status: AC
  Administered 2017-06-10: 75 ug via INTRAVENOUS
  Filled 2017-06-10: qty 2

## 2017-06-10 MED ORDER — ACETAMINOPHEN 650 MG RE SUPP: 650.0000 mg | Freq: Four times a day (QID) | RECTAL | Status: DC | PRN

## 2017-06-10 MED ORDER — GI COCKTAIL ~~LOC~~
30.0000 mL | Freq: Once | ORAL | Status: AC
  Administered 2017-06-10: 30 mL via ORAL
  Filled 2017-06-10: qty 30

## 2017-06-10 MED ORDER — INSULIN ASPART 100 UNIT/ML ~~LOC~~ SOLN
0.0000 [IU] | Freq: Three times a day (TID) | SUBCUTANEOUS | Status: DC
  Administered 2017-06-10: 5 [IU] via SUBCUTANEOUS
  Administered 2017-06-11: 2 [IU] via SUBCUTANEOUS
  Administered 2017-06-11: 5 [IU] via SUBCUTANEOUS
  Filled 2017-06-10 (×3): qty 1

## 2017-06-10 MED ORDER — PANTOPRAZOLE SODIUM 40 MG IV SOLR
40.0000 mg | Freq: Two times a day (BID) | INTRAVENOUS | Status: DC
  Administered 2017-06-10 – 2017-06-11 (×3): 40 mg via INTRAVENOUS
  Filled 2017-06-10 (×3): qty 40

## 2017-06-10 MED ORDER — LISINOPRIL 20 MG PO TABS
20.0000 mg | ORAL_TABLET | Freq: Every day | ORAL | Status: DC
  Administered 2017-06-10 – 2017-06-11 (×2): 20 mg via ORAL
  Filled 2017-06-10 (×2): qty 1

## 2017-06-10 MED ORDER — MORPHINE SULFATE (PF) 2 MG/ML IV SOLN
2.0000 mg | INTRAVENOUS | Status: DC | PRN
  Administered 2017-06-10 – 2017-06-11 (×4): 2 mg via INTRAVENOUS
  Administered 2017-06-11: 4 mg via INTRAVENOUS
  Filled 2017-06-10 (×2): qty 1
  Filled 2017-06-10: qty 2
  Filled 2017-06-10: qty 1

## 2017-06-10 MED ORDER — ONDANSETRON HCL 4 MG/2ML IJ SOLN
4.0000 mg | Freq: Once | INTRAMUSCULAR | Status: AC
  Administered 2017-06-10: 4 mg via INTRAVENOUS
  Filled 2017-06-10: qty 2

## 2017-06-10 MED ORDER — ONDANSETRON HCL 4 MG PO TABS: 4.0000 mg | ORAL_TABLET | Freq: Four times a day (QID) | ORAL | Status: DC | PRN

## 2017-06-10 MED ORDER — HYDROCHLOROTHIAZIDE 25 MG PO TABS
25.0000 mg | ORAL_TABLET | Freq: Every day | ORAL | Status: DC
  Administered 2017-06-10 – 2017-06-11 (×2): 25 mg via ORAL
  Filled 2017-06-10 (×2): qty 1

## 2017-06-10 MED ORDER — BENZONATATE 100 MG PO CAPS: 200.0000 mg | ORAL_CAPSULE | Freq: Three times a day (TID) | ORAL | Status: DC | PRN

## 2017-06-10 MED ORDER — ALBUTEROL SULFATE (2.5 MG/3ML) 0.083% IN NEBU: 2.5000 mg | INHALATION_SOLUTION | RESPIRATORY_TRACT | Status: DC | PRN

## 2017-06-10 MED ORDER — FAMOTIDINE IN NACL 20-0.9 MG/50ML-% IV SOLN
20.0000 mg | Freq: Two times a day (BID) | INTRAVENOUS | Status: DC
Start: 2017-06-10 — End: 2017-06-10
  Filled 2017-06-10: qty 50

## 2017-06-10 MED ORDER — ACETAMINOPHEN 325 MG PO TABS: 650.0000 mg | ORAL_TABLET | Freq: Four times a day (QID) | ORAL | Status: DC | PRN

## 2017-06-10 MED ORDER — ENOXAPARIN SODIUM 40 MG/0.4ML ~~LOC~~ SOLN: 40.0000 mg | SUBCUTANEOUS | Status: DC

## 2017-06-10 MED ORDER — CITALOPRAM HYDROBROMIDE 20 MG PO TABS
40.0000 mg | ORAL_TABLET | Freq: Every day | ORAL | Status: DC
  Administered 2017-06-10 – 2017-06-11 (×2): 40 mg via ORAL
  Filled 2017-06-10 (×2): qty 2

## 2017-06-10 MED ORDER — SODIUM CHLORIDE 0.9 % IV SOLN
INTRAVENOUS | Status: DC
  Administered 2017-06-10: 16:00:00 via INTRAVENOUS

## 2017-06-10 NOTE — ED Provider Notes (Signed)
[facility] Emergency Department Provider Note  ____________________________________________   First MD Initiated Contact with Patient 06/10/17 [phone]     (approximate)  I have reviewed the triage vital signs and the nursing notes.   HISTORY  Chief Complaint Chest Pain   HPI Gerald [name] is a 52 y.o. male who self presents to the emergency department with typical chest pain.  His pain initially began with epigastric discomfort nausea and he vomited several times.  Thereafter he has had pressure-like substernal left upper chest pain radiating towards his back associated with nausea.  Is also associated with shortness of breath.  His symptoms are worse with exertion and mildly improved with rest.  He has a past medical history of obesity, hypertension, and known coronary artery disease.  He is followed by Dr. Rockey Situ and last had a cardiac catheterization roughly 1 year ago.  The pain is not ripping or tearing.  It was not sudden onset.  He denies numbness or weakness.  Past Medical History:  Diagnosis Date  . Abnormal LFTs   . Coronary artery disease    Mild nonobstructive coronary artery disease on previous cardiac catheterization most recently in August of 2013  . Diabetes mellitus without complication (HCC)    borderline  . Fundic gland polyps of stomach, benign   . Gastritis   . GERD (gastroesophageal reflux disease)   . Hepatic steatosis   . Hyperlipidemia   . Hypertension   . Myocardial infarction ([place]) 09/2011  . Nonischemic cardiomyopathy (HCC)    Previous ejection fraction of 35-40% on echo. 50% on cardiac catheterization in August of 2013  . Obesity   . Sleep apnea     Patient Active Problem List   Diagnosis Date Noted  . Generalized anxiety disorder 02/02/2017  . Other fatigue 02/02/2017  . Abnormal results of liver function studies 02/02/2017  . Myalgia 02/02/2017  . Edema, unspecified 02/02/2017  . Peripheral vascular disease ([place])  02/02/2017  . Circadian rhythm sleep disorder, shift work type 02/02/2017  . Acne vulgaris 02/02/2017  . Occlusion and stenosis of bilateral carotid arteries 02/02/2017  . Abnormal weight gain 02/02/2017  . Pain in unspecified knee 02/02/2017  . Neoplasm of uncertain behavior of skin 02/02/2017  . Hypersomnia, unspecified 02/02/2017  . Shortness of breath 02/02/2017  . Morbid obesity ([place]) 07/07/2016  . Chest pain 06/08/2016  . Other chest pain 05/22/2014  . Mass of jaw 05/22/2014  . Right flank pain 11/23/2013  . Dysuria 11/23/2013  . Encounter for wellness examination 11/08/2013  . Retaining fluid 11/08/2013  . Urinary incontinence 03/14/2013  . Depression 01/13/2013  . Type 2 diabetes mellitus ([place]) 09/14/2012  . Nonischemic cardiomyopathy ([place])   . Mixed hyperlipidemia 05/28/2012  . CAD (coronary artery disease) 03/30/2012  . Chronic pain syndrome 03/30/2012  . HTN (hypertension) 03/30/2012    Past Surgical History:  Procedure Laterality Date  . CARDIAC CATHETERIZATION N/A 09/2011   [facility]; EF 50% with 30% mid LAD stenosis and no obstructive disease.  Marland Kitchen CARDIAC CATHETERIZATION  09/2010   [facility]; Mid LAD 40% stenosis; Mid Circumflex:Normal; Mid RCA; Normal  . CARDIAC CATHETERIZATION    . COLONOSCOPY    . KNEE ARTHROSCOPY WITH MEDIAL MENISECTOMY Right 09/16/2012   Procedure: RIGHT KNEE ARTHROSCOPY WITH MEDIAL AND LATERAL MENISECTOMY, CHONDROPLASTY;  Surgeon: Ninetta Lights, MD;  Location: [place];  Service: Orthopedics;  Laterality: Right;  RIGHT KNEE SCOPE MEDIAL MENISCECTOMY  . LEFT HEART CATH AND CORONARY ANGIOGRAPHY N/A  06/11/2016   Procedure: Left Heart Cath and Coronary Angiography;  Surgeon: Minna Merritts, MD;  Location: [place] [facility];  Service: Cardiovascular;  Laterality: N/A;    Prior to Admission medications   Medication Sig Start Date End Date Taking? Authorizing Provider  albuterol (PROVENTIL HFA;VENTOLIN HFA) 108 (90 Base) MCG/ACT  inhaler Inhale 2 puffs into the lungs every 6 (six) hours as needed for wheezing or shortness of breath. 02/25/17  Yes Nance Pear, MD  aspirin EC 81 MG tablet Take 81 mg by mouth daily.   Yes [provider]  benzonatate (TESSALON) 200 MG capsule Take 1 capsule (200 mg total) by mouth 3 (three) times daily as needed for cough. 06/08/17  Yes Boscia, Heather E, NP  citalopram (CELEXA) 40 MG tablet TAKE 1 TABLET BY MOUTH EVERY DAY FOR GAD 04/22/17  Yes [provider]  Dulaglutide (TRULICITY) 1.5 XV/4.0GQ SOPN Inject 1.5 mg into the skin once a week.   Yes [provider]  guaiFENesin (MUCINEX) 600 MG 12 hr tablet Take 1 tablet (600 mg total) by mouth 2 (two) times daily as needed for cough or to loosen phlegm. 01/29/17  Yes Boscia, Greer Ee, NP  ibuprofen (ADVIL,MOTRIN) 800 MG tablet Take 1 tablet (800 mg total) by mouth every 8 (eight) hours as needed. Patient taking differently: Take 800 mg by mouth every 8 (eight) hours as needed for fever or moderate pain.  01/10/17  Yes [name], MD  levofloxacin (LEVAQUIN) 500 MG tablet Take 1 tablet (500 mg total) by mouth daily. 06/08/17  Yes Boscia, Greer Ee, NP  lisinopril-hydrochlorothiazide (PRINZIDE,ZESTORETIC) 20-25 MG tablet Take 1 tablet by mouth daily. 05/25/16  Yes [provider]  ondansetron (ZOFRAN ODT) 4 MG disintegrating tablet Take 1 tablet (4 mg total) by mouth every 8 (eight) hours as needed for nausea or vomiting. 12/01/15  Yes Alfred Levins, [place], MD  albuterol (PROVENTIL HFA) 108 (90 BASE) MCG/ACT inhaler Inhale 2 puffs into the lungs every 4 (four) hours as needed for wheezing or shortness of breath. Patient not taking: Reported on 05/26/2017 12/30/14   Carrie Mew, MD  nitroGLYCERIN (NITROSTAT) 0.4 MG SL tablet Place 1 tablet (0.4 mg total) under the tongue every 5 (five) minutes as needed for chest pain. Patient not taking: Reported on 06/10/2017 11/08/13   Lucille Passy, MD  omeprazole  (PRILOSEC) 40 MG capsule TAKE 1 CAPSULE (40 MG TOTAL) BY MOUTH DAILY. Patient not taking: Reported on 06/10/2017 04/29/16   Irene Shipper, MD  sulfamethoxazole-trimethoprim (BACTRIM DS,SEPTRA DS) 800-160 MG tablet Take 1 tablet by mouth 2 (two) times daily. Patient not taking: Reported on 06/10/2017 05/26/17   Ronnell Freshwater, NP    Allergies Statins  Family History  Problem Relation Age of Onset  . Heart disease Father 61       MI  . Heart attack Father   . Stomach cancer Father   . Hypertension Mother   . Breast cancer Mother     Social History Social History   Tobacco Use  . Smoking status: Never Smoker  . Smokeless tobacco: Never Used  Substance Use Topics  . Alcohol use: Yes    Alcohol/week: 0.0 oz    Comment: rare  . Drug use: No    Review of Systems Constitutional: No fever/chills Eyes: No visual changes. ENT: No sore throat. Cardiovascular: Positive for chest pain. Respiratory: Positive for shortness of breath. Gastrointestinal: Positive for abdominal pain.  Positive for nausea, positive for vomiting.  No  diarrhea.  No constipation. Genitourinary: Negative for dysuria. Musculoskeletal: Negative for back pain. Skin: Negative for rash. Neurological: Negative for headaches, focal weakness or numbness.   ____________________________________________   PHYSICAL EXAM:  VITAL SIGNS: ED Triage Vitals [06/10/17 0526]  Enc Vitals Group     BP      Pulse      Resp      Temp      Temp src      SpO2      Weight (!) 340 lb (154.2 kg)     Height 6\' 2"  (1.88 m)     Head Circumference      Peak Flow      Pain Score 8     Pain Loc      Pain Edu?      Excl. in [place]?     Constitutional: Alert and oriented x4 appears quite uncomfortable mild diaphoresis holding his chest Eyes: PERRL EOMI. Head: Atraumatic. Nose: No congestion/rhinnorhea. Mouth/Throat: No trismus Neck: No stridor.   Cardiovascular: Normal rate, regular rhythm. Grossly normal heart sounds.  Good  peripheral circulation. Respiratory: Increased respiratory effort of the lungs are clear Gastrointestinal: Obese abdomen soft nontender Musculoskeletal: No lower extremity edema   Neurologic:  Normal speech and language. No gross focal neurologic deficits are appreciated. Skin: Mild diaphoresis Psychiatric: Anxious appearing   ____________________________________________   DIFFERENTIAL includes but not limited to  Acute coronary syndrome, aortic dissection, pancreatitis, appendicitis ____________________________________________   LABS (all labs ordered are listed, but only abnormal results are displayed)  Labs Reviewed  COMPREHENSIVE METABOLIC PANEL - Abnormal; Notable for the following components:      Result Value   Sodium 131 (*)    Chloride 98 (*)    Glucose, Bld 359 (*)    AST 50 (*)    ALT 67 (*)    All other components within normal limits  CBC  TROPONIN I  LIPASE, BLOOD    Lab work reviewed by me with no acute disease __________________________________________  EKG  ED ECG REPORT I, Darel Hong, the attending physician, personally viewed and interpreted this ECG.  Date: 06/10/2017 EKG Time: 0534 Rate: 80 Rhythm: normal sinus rhythm QRS Axis: normal Intervals: normal ST/T Wave abnormalities: normal Narrative Interpretation: no evidence of acute ischemia  _______________  ED ECG REPORT I, Darel Hong, the attending physician, personally viewed and interpreted this ECG.  Date: 06/10/2017 EKG Time: 0617 Rate: 85 Rhythm: normal sinus rhythm QRS Axis: normal Intervals: normal ST/T Wave abnormalities: normal Narrative Interpretation: no evidence of acute ischemia _____________________________  RADIOLOGY  Chest x-ray reviewed by me with no acute disease ____________________________________________   PROCEDURES  Procedure(s) performed: no  Procedures  Critical Care performed: no  Observation:  no ____________________________________________   INITIAL IMPRESSION / ASSESSMENT AND PLAN / ED COURSE  Pertinent labs & imaging results that were available during my care of the patient were reviewed by me and considered in my medical decision making (see chart for details).  Patient arrives uncomfortable appearing with mild diaphoresis and clutching his chest.  First EKG is nonischemic.  Given 162 mg of aspirin and 1 tablet of nitroglycerin which helped mildly.  After second tablet of nitro the patient's symptoms worsened and I obtained a repeat EKG which is unchanged.  Appreciate that the patient's first troponin is negative however given his multiple risk factors and known coronary artery disease I am concerned he is having unstable angina.  He requires inpatient admission for further troponins and risk stratification.  I discussed with the patient verbalized understanding and agreement the plan.  I then discussed with the hospitalist who has graciously agreed to admit the patient to his service      ____________________________________________   FINAL CLINICAL IMPRESSION(S) / ED DIAGNOSES  Final diagnoses:  Unstable angina ([place])      NEW MEDICATIONS STARTED DURING THIS VISIT:  New Prescriptions   No medications on file     Note:  This document was prepared using Dragon voice recognition software and may include unintentional dictation errors.     Darel Hong, MD 06/10/17 [phone]

## 2017-06-10 NOTE — ED Notes (Signed)
Hospitalist to bedside.

## 2017-06-10 NOTE — Progress Notes (Signed)
Cardiology Consultation:   Patient ID: ALEKSA [name]; [id number]; 01-07-1966   Admit date: 06/10/2017 Date of Consult: 06/10/2017  Primary Care Provider: Ronnell Freshwater, NP Primary Cardiologist: Esmond Plants Physician requesting consult: Dr. Marcille Blanco Reason for consult: Chest pain, cad    Patient Profile:   Gerald [name] is a 52 y.o. male with a hx of   History of Present Illness:   Gerald [name] a 52 y.o.male with a history of Obesity Chronic chest pain nonobstructive CAD with 30% mid LAD stenosis by catheterization in 2013, 2016, 2018 nonischemic cardiomyopathy with normalization of LV function,  type 2 diabetes mellitus,  hypertension,  hyperlipidemia,  obstructive sleep apnea on CPAP,  morbid obesity.  Hospital admission May 2018 for chest pain Cardiac catheterization May 2018 showing nonobstructive coronary disease Who presents to the hospital with nausea vomiting chest pain bloody emesis/black  Reports that he has been fighting a chronic cough for the past month, dry nonproductive Started on antibiotics by primary care recently Does not think it is getting any better Yesterday at work had vomiting, large volume of the first time Third episode of vomiting had black emesis  He presented to the emergency room Initial cardiac enzymes negative, no significant change in EKG No relief in chest pain with nitro Moderate relief of chest pain with fentanyl  Currently laying supine in bed, mild discomfort, main complaint is left lower abdominal discomfort on palpation.  Reports initially in the ER left lower quadrant discomfort was severe  Previous cardiac work-up including stress testing and cardiac catheterizations in 2018 Cardiac cath Nonobstructive disease 30 to 40% proximal to mid LAD, unchanged from previous study No further cardiac workup needed Chest pain likely noncardiac in nature Recommendation made to Continue asa 81 mg daily with aggressive  statin therapy, Goal LDL <70     Past Medical History:  Diagnosis Date  . Abnormal LFTs   . Coronary artery disease    Mild nonobstructive coronary artery disease on previous cardiac catheterization most recently in August of 2013  . Diabetes mellitus without complication (HCC)    borderline  . Fundic gland polyps of stomach, benign   . Gastritis   . GERD (gastroesophageal reflux disease)   . Hepatic steatosis   . Hyperlipidemia   . Hypertension   . Myocardial infarction ([place]) 09/2011  . Nonischemic cardiomyopathy (HCC)    Previous ejection fraction of 35-40% on echo. 50% on cardiac catheterization in August of 2013  . Obesity   . Sleep apnea     Past Surgical History:  Procedure Laterality Date  . CARDIAC CATHETERIZATION N/A 09/2011   [facility]; EF 50% with 30% mid LAD stenosis and no obstructive disease.  Marland Kitchen CARDIAC CATHETERIZATION  09/2010   [facility]; Mid LAD 40% stenosis; Mid Circumflex:Normal; Mid RCA; Normal  . CARDIAC CATHETERIZATION    . COLONOSCOPY    . KNEE ARTHROSCOPY WITH MEDIAL MENISECTOMY Right 09/16/2012   Procedure: RIGHT KNEE ARTHROSCOPY WITH MEDIAL AND LATERAL MENISECTOMY, CHONDROPLASTY;  Surgeon: Ninetta Lights, MD;  Location: [place];  Service: Orthopedics;  Laterality: Right;  RIGHT KNEE SCOPE MEDIAL MENISCECTOMY  . LEFT HEART CATH AND CORONARY ANGIOGRAPHY N/A 06/11/2016   Procedure: Left Heart Cath and Coronary Angiography;  Surgeon: Minna Merritts, MD;  Location: [place] [facility];  Service: Cardiovascular;  Laterality: N/A;     Home Medications:  Prior to Admission medications   Medication Sig Start Date End Date Taking? Authorizing Provider  albuterol (PROVENTIL HFA;VENTOLIN HFA)  108 (90 Base) MCG/ACT inhaler Inhale 2 puffs into the lungs every 6 (six) hours as needed for wheezing or shortness of breath. 02/25/17  Yes Nance Pear, MD  aspirin EC 81 MG tablet Take 81 mg by mouth daily.   Yes [provider]  benzonatate  (TESSALON) 200 MG capsule Take 1 capsule (200 mg total) by mouth 3 (three) times daily as needed for cough. 06/08/17  Yes Boscia, Heather E, NP  citalopram (CELEXA) 40 MG tablet TAKE 1 TABLET BY MOUTH EVERY DAY FOR GAD 04/22/17  Yes [provider]  Dulaglutide (TRULICITY) 1.5 JH/4.1DE SOPN Inject 1.5 mg into the skin once a week.   Yes [provider]  guaiFENesin (MUCINEX) 600 MG 12 hr tablet Take 1 tablet (600 mg total) by mouth 2 (two) times daily as needed for cough or to loosen phlegm. 01/29/17  Yes Boscia, Greer Ee, NP  ibuprofen (ADVIL,MOTRIN) 800 MG tablet Take 1 tablet (800 mg total) by mouth every 8 (eight) hours as needed. Patient taking differently: Take 800 mg by mouth every 8 (eight) hours as needed for fever or moderate pain.  01/10/17  Yes [name], MD  levofloxacin (LEVAQUIN) 500 MG tablet Take 1 tablet (500 mg total) by mouth daily. 06/08/17  Yes Boscia, Greer Ee, NP  lisinopril-hydrochlorothiazide (PRINZIDE,ZESTORETIC) 20-25 MG tablet Take 1 tablet by mouth daily. 05/25/16  Yes [provider]  ondansetron (ZOFRAN ODT) 4 MG disintegrating tablet Take 1 tablet (4 mg total) by mouth every 8 (eight) hours as needed for nausea or vomiting. 12/01/15  Yes Alfred Levins, [place], MD  albuterol (PROVENTIL HFA) 108 (90 BASE) MCG/ACT inhaler Inhale 2 puffs into the lungs every 4 (four) hours as needed for wheezing or shortness of breath. Patient not taking: Reported on 05/26/2017 12/30/14   Carrie Mew, MD  nitroGLYCERIN (NITROSTAT) 0.4 MG SL tablet Place 1 tablet (0.4 mg total) under the tongue every 5 (five) minutes as needed for chest pain. Patient not taking: Reported on 06/10/2017 11/08/13   Lucille Passy, MD  omeprazole (PRILOSEC) 40 MG capsule TAKE 1 CAPSULE (40 MG TOTAL) BY MOUTH DAILY. Patient not taking: Reported on 06/10/2017 04/29/16   Irene Shipper, MD  sulfamethoxazole-trimethoprim (BACTRIM DS,SEPTRA DS) 800-160 MG tablet Take 1 tablet by mouth 2  (two) times daily. Patient not taking: Reported on 06/10/2017 05/26/17   Ronnell Freshwater, NP    Inpatient Medications: Scheduled Meds: . [START ON 06/11/2017] aspirin EC  81 mg Oral Daily  . citalopram  40 mg Oral Daily  . docusate sodium  100 mg Oral BID  . enoxaparin (LOVENOX) injection  40 mg Subcutaneous Q24H  . gi cocktail  30 mL Oral Once  . hydrochlorothiazide  25 mg Oral Daily  . insulin aspart  0-5 Units Subcutaneous QHS  . insulin aspart  0-9 Units Subcutaneous TID WC  . levofloxacin  500 mg Oral Daily  . lisinopril  20 mg Oral Daily  . pantoprazole (PROTONIX) IV  40 mg Intravenous Q12H   Continuous Infusions: . famotidine (PEPCID) IV     PRN Meds: acetaminophen **OR** acetaminophen, albuterol, benzonatate, guaiFENesin, morphine injection, nitroGLYCERIN, ondansetron **OR** ondansetron (ZOFRAN) IV  Allergies:    Allergies  Allergen Reactions  . Statins Rash         Social History:   Social History   Socioeconomic History  . Marital status: Married    Spouse name: Not on file  . Number of children: 3  . Years of education: Not  on file  . Highest education level: Not on file  Occupational History  . Occupation: Librarian, academic  Social Needs  . Financial resource strain: Not on file  . Food insecurity:    Worry: Not on file    Inability: Not on file  . Transportation needs:    Medical: Not on file    Non-medical: Not on file  Tobacco Use  . Smoking status: Never Smoker  . Smokeless tobacco: Never Used  Substance and Sexual Activity  . Alcohol use: Yes    Alcohol/week: 0.0 oz    Comment: rare  . Drug use: No  . Sexual activity: Not on file  Lifestyle  . Physical activity:    Days per week: Not on file    Minutes per session: Not on file  . Stress: Not on file  Relationships  . Social connections:    Talks on phone: Not on file    Gets together: Not on file    Attends religious service: Not on file    Active member of club or organization: Not  on file    Attends meetings of clubs or organizations: Not on file    Relationship status: Not on file  . Intimate partner violence:    Fear of current or ex partner: Not on file    Emotionally abused: Not on file    Physically abused: Not on file    Forced sexual activity: Not on file  Other Topics Concern  . Not on file  Social History Narrative   Married.  41 yo son.   Wife on disability for bipolar disorder.      Family History:    Family History  Problem Relation Age of Onset  . Heart disease Father 45       MI  . Heart attack Father   . Stomach cancer Father   . Hypertension Mother   . Breast cancer Mother      ROS:  Please see the history of present illness.  Review of Systems  Constitutional: Negative.   Respiratory: Negative.   Cardiovascular: Positive for chest pain.  Gastrointestinal: Positive for nausea and vomiting.  Musculoskeletal: Negative.   Neurological: Negative.   Psychiatric/Behavioral: Negative.   All other systems reviewed and are negative.   Physical Exam/Data:   Vitals:   06/10/17 0600 06/10/17 0700 06/10/17 0730 06/10/17 0806  BP: 107/86 (!) 131/103 128/89   Pulse: 95 86 79   Resp: (!) 24 (!) 24 (!) 22   Temp:      TempSrc:      SpO2: 92% 93% 95%   Weight:    (!) 329 lb 6.4 oz (149.4 kg)  Height:    6\' 2"  (1.88 m)   No intake or output data in the 24 hours ending 06/10/17 0921 Filed Weights   06/10/17 0526 06/10/17 0806  Weight: (!) 340 lb (154.2 kg) (!) 329 lb 6.4 oz (149.4 kg)   Body mass index is 42.29 kg/m.  General:  Well nourished, well developed, in no acute distress HEENT: normal Lymph: no adenopathy Neck: no JVD Endocrine:  No thryomegaly Vascular: No carotid bruits; FA pulses 2+ bilaterally without bruits  Cardiac:  normal S1, S2; RRR; no murmur  Lungs:  clear to auscultation bilaterally, no wheezing, rhonchi or rales  Abd: soft, nontender, no hepatomegaly  Ext: no edema Musculoskeletal:  No deformities, BUE and  BLE strength normal and equal Skin: warm and dry  Neuro:  CNs 2-12 intact, no focal  abnormalities noted Psych:  Normal affect   EKG:  The EKG was personally reviewed and demonstrates: Normal sinus rhythm with rate 85 bpm no significant ST or T wave changes Telemetry:  Telemetry was personally reviewed and demonstrates:  NSR rate 77  Relevant CV Studies:  Echo 06/2016 Procedure narrative: Transthoracic echocardiography. The study   was technically difficult. - Left ventricle: The cavity size was normal. Systolic function was   normal. The estimated ejection fraction was in the range of 55%   to 60%. Regional wall motion abnormalities cannot be excluded.   Doppler parameters are consistent with abnormal left ventricular   relaxation (grade 1 diastolic dysfunction). - Left atrium: The atrium was normal in size. - Right ventricle: Systolic function was normal. - Pulmonary arteries: Systolic pressure could not be accurately   estimated.  Laboratory Data:  Chemistry Recent Labs  Lab 06/10/17 0533  NA 131*  K 4.0  CL 98*  CO2 24  GLUCOSE 359*  BUN 17  CREATININE 1.10  CALCIUM 8.9  GFRNONAA >60  GFRAA >60  ANIONGAP 9    Recent Labs  Lab 06/10/17 0533  PROT 7.5  ALBUMIN 4.0  AST 50*  ALT 67*  ALKPHOS 107  BILITOT 0.6   Hematology Recent Labs  Lab 06/10/17 0533  WBC 7.7  RBC 4.77  HGB 14.2  HCT 41.2  MCV 86.4  MCH 29.7  MCHC 34.4  RDW 14.0  PLT 229   Cardiac Enzymes Recent Labs  Lab 06/10/17 0533  TROPONINI <0.03   No results for input(s): TROPIPOC in the last 168 hours.  BNPNo results for input(s): BNP, PROBNP in the last 168 hours.  DDimer No results for input(s): DDIMER in the last 168 hours.  Radiology/Studies:  Dg Chest 2 View  Result Date: 06/10/2017 CLINICAL DATA:  52 year old male with chest pain. EXAM: CHEST - 2 VIEW COMPARISON:  Chest radiograph dated 02/25/2017 FINDINGS: Shallow inspiration minimal bibasilar atelectasis. There is chronic  bronchitic changes. No focal consolidation, pleural effusion, or pneumothorax. The cardiac silhouette is within normal limits. No acute osseous pathology. IMPRESSION: No active cardiopulmonary disease. Electronically Signed   By: Anner Crete M.D.   On: 06/10/2017 06:42    Assessment and Plan:   1. Nausea vomiting/hematemesis Concerning for upper GI bleed Third round of vomiting noted to have pure black emesis, "filled the toilet" -Needs IV PPI GI consult  Hold aspirin Unable to exclude Mallory-Weiss tear Family reports significant history of ulcers Consider GI cocktail, Zofran as needed N.p.o.  2 chest pain Presenting after vomiting Suspect musculoskeletal, unable to exclude Mallory-Weiss tear Initial cardiac enzymes negative initial EKG no changes Prior cardiac catheterization 1 year ago with minimal 30% LAD disease unchanged over many years No plan for cardiac intervention at this time Would follow-up on troponin x3  3) DM 2 Previously on Trulicity Started on sliding scale  4) obstructive sleep apnea on CPAP Continue CPAP in-house  5) chronic cough Started on Levaquin past several days but he reports no improvement in symptoms Could consider repeat echocardiogram to evaluate EF, right heart pressures   Total encounter time more than 110 minutes  Greater than 50% was spent in counseling and coordination of care with the patient   For questions or updates, please contact [facility] Please consult [email or web address] for contact info under Cardiology/STEMI.   Signed, Ida Rogue, MD  06/10/2017 9:21 AM

## 2017-06-10 NOTE — Anesthesia Post-op Follow-up Note (Signed)
Anesthesia QCDR form completed.        

## 2017-06-10 NOTE — Transfer of Care (Signed)
Immediate Anesthesia Transfer of Care Note  Patient: Gerald [name]  Procedure(s) Performed: ESOPHAGOGASTRODUODENOSCOPY (EGD) WITH PROPOFOL (N/A )  Patient Location: PACU  Anesthesia Type:General  Level of Consciousness: sedated  Airway & Oxygen Therapy: Patient Spontanous Breathing and Patient connected to nasal cannula oxygen  Post-op Assessment: Report given to RN and Post -op Vital signs reviewed and stable  Post vital signs: Reviewed and stable  Last Vitals:  Vitals Value Taken Time  BP    Temp    Pulse    Resp    SpO2      Last Pain:  Vitals:   06/10/17 1504  TempSrc: Tympanic  PainSc: 6          Complications: No apparent anesthesia complications

## 2017-06-10 NOTE — OR Nursing (Signed)
Transport here to take pt back to his room 231.

## 2017-06-10 NOTE — Consult Note (Signed)
Jonathon Bellows , MD [address] [place], [place], [place], [address] [address], [place], [place], [place], [address] Phone: [phone]  Fax: [phone]  Consultation  Referring Provider:Dr Marthann Schiller  Primary Care Physician:  Ronnell Freshwater, NP Primary Gastroenterologist: None       Reason for Consultation:     GI bleed   Date of Admission:  06/10/2017 Date of Consultation:  06/10/2017         HPI:   Gerald [name] is a 52 y.o. male presented to the hospital yesterday with chest pains. He has CAD and a past MI . In addition he had nausea and vomiting and 3 episodes of dark colored vomitus which began yesterday with some preceding nausea. No bowel movements . Hb 14.2 grams on admission. Elevated glucose and normal BUN. No similar episodes in the past . Denies any NSAID use.   Presently has some mild epigastric discomfort. Says he has had ulcers in the past.   Past Medical History:  Diagnosis Date  . Abnormal LFTs   . Coronary artery disease    Mild nonobstructive coronary artery disease on previous cardiac catheterization most recently in August of 2013  . Diabetes mellitus without complication (HCC)    borderline  . Fundic gland polyps of stomach, benign   . Gastritis   . GERD (gastroesophageal reflux disease)   . Hepatic steatosis   . Hyperlipidemia   . Hypertension   . Myocardial infarction ([facility]) 09/2011  . Nonischemic cardiomyopathy (HCC)    Previous ejection fraction of 35-40% on echo. 50% on cardiac catheterization in August of 2013  . Obesity   . Sleep apnea     Past Surgical History:  Procedure Laterality Date  . CARDIAC CATHETERIZATION N/A 09/2011   [facility]; EF 50% with 30% mid LAD stenosis and no obstructive disease.  Marland Kitchen CARDIAC CATHETERIZATION  09/2010   [facility]; Mid LAD 40% stenosis; Mid Circumflex:Normal; Mid RCA; Normal  . CARDIAC CATHETERIZATION    . COLONOSCOPY    . KNEE ARTHROSCOPY WITH MEDIAL MENISECTOMY Right 09/16/2012   Procedure: RIGHT KNEE  ARTHROSCOPY WITH MEDIAL AND LATERAL MENISECTOMY, CHONDROPLASTY;  Surgeon: Ninetta Lights, MD;  Location: [facility];  Service: Orthopedics;  Laterality: Right;  RIGHT KNEE SCOPE MEDIAL MENISCECTOMY  . LEFT HEART CATH AND CORONARY ANGIOGRAPHY N/A 06/11/2016   Procedure: Left Heart Cath and Coronary Angiography;  Surgeon: Minna Merritts, MD;  Location: [facility];  Service: Cardiovascular;  Laterality: N/A;    Prior to Admission medications   Medication Sig Start Date End Date Taking? Authorizing Provider  albuterol (PROVENTIL HFA;VENTOLIN HFA) 108 (90 Base) MCG/ACT inhaler Inhale 2 puffs into the lungs every 6 (six) hours as needed for wheezing or shortness of breath. 02/25/17  Yes Nance Pear, MD  aspirin EC 81 MG tablet Take 81 mg by mouth daily.   Yes [provider]  benzonatate (TESSALON) 200 MG capsule Take 1 capsule (200 mg total) by mouth 3 (three) times daily as needed for cough. 06/08/17  Yes Boscia, Heather E, NP  citalopram (CELEXA) 40 MG tablet TAKE 1 TABLET BY MOUTH EVERY DAY FOR GAD 04/22/17  Yes [provider]  Dulaglutide (TRULICITY) 1.5 ZC/5.8IF SOPN Inject 1.5 mg into the skin once a week.   Yes [provider]  guaiFENesin (MUCINEX) 600 MG 12 hr tablet Take 1 tablet (600 mg total) by mouth 2 (two) times daily as needed for cough or to loosen phlegm. 01/29/17  Yes Boscia, Heather E, NP  ibuprofen (ADVIL,MOTRIN) 800 MG tablet Take 1 tablet (800 mg total) by mouth every 8 (eight) hours as needed. Patient taking differently: Take 800 mg by mouth every 8 (eight) hours as needed for fever or moderate pain.  01/10/17  Yes [name], MD  levofloxacin (LEVAQUIN) 500 MG tablet Take 1 tablet (500 mg total) by mouth daily. 06/08/17  Yes Boscia, Greer Ee, NP  lisinopril-hydrochlorothiazide (PRINZIDE,ZESTORETIC) 20-25 MG tablet Take 1 tablet by mouth daily. 05/25/16  Yes [provider]  ondansetron (ZOFRAN ODT) 4 MG  disintegrating tablet Take 1 tablet (4 mg total) by mouth every 8 (eight) hours as needed for nausea or vomiting. 12/01/15  Yes Alfred Levins, [place], MD  albuterol (PROVENTIL HFA) 108 (90 BASE) MCG/ACT inhaler Inhale 2 puffs into the lungs every 4 (four) hours as needed for wheezing or shortness of breath. Patient not taking: Reported on 05/26/2017 12/30/14   Carrie Mew, MD  nitroGLYCERIN (NITROSTAT) 0.4 MG SL tablet Place 1 tablet (0.4 mg total) under the tongue every 5 (five) minutes as needed for chest pain. Patient not taking: Reported on 06/10/2017 11/08/13   Lucille Passy, MD  omeprazole (PRILOSEC) 40 MG capsule TAKE 1 CAPSULE (40 MG TOTAL) BY MOUTH DAILY. Patient not taking: Reported on 06/10/2017 04/29/16   Irene Shipper, MD  sulfamethoxazole-trimethoprim (BACTRIM DS,SEPTRA DS) 800-160 MG tablet Take 1 tablet by mouth 2 (two) times daily. Patient not taking: Reported on 06/10/2017 05/26/17   Ronnell Freshwater, NP    Family History  Problem Relation Age of Onset  . Heart disease Father 5       MI  . Heart attack Father   . Stomach cancer Father   . Hypertension Mother   . Breast cancer Mother      Social History   Tobacco Use  . Smoking status: Never Smoker  . Smokeless tobacco: Never Used  Substance Use Topics  . Alcohol use: Yes    Alcohol/week: 0.0 oz    Comment: rare  . Drug use: No    Allergies as of 06/10/2017 - Review Complete 06/10/2017  Allergen Reaction Noted  . Statins Rash 04/15/2012    Review of Systems:    All systems reviewed and negative except where noted in HPI.   Physical Exam:  Vital signs in last 24 hours: Temp:  [98.6 F (37 C)] 98.6 F (37 C) (05/08 0548) Pulse Rate:  [79-95] 79 (05/08 0730) Resp:  [11-24] 22 (05/08 0730) BP: (107-138)/(86-103) 128/89 (05/08 0730) SpO2:  [92 %-97 %] 95 % (05/08 0730) Weight:  [329 lb 6.4 oz (149.4 kg)-340 lb (154.2 kg)] 329 lb 6.4 oz (149.4 kg) (05/08 0806)   General:   Pleasant, cooperative in NAD Head:   Normocephalic and atraumatic. Eyes:   No icterus.   Conjunctiva pink. PERRLA. Ears:  Normal auditory acuity. Neck:  Supple; no masses or thyroidomegaly Lungs: Respirations even and unlabored. Lungs clear to auscultation bilaterally.   No wheezes, crackles, or rhonchi.  Heart:  Regular rate and rhythm;  Without murmur, clicks, rubs or gallops Abdomen:  Soft, nondistended, mild epigastric tenderness Normal bowel sounds. No appreciable masses or hepatomegaly.  No rebound or guarding.  Neurologic:  Alert and oriented x3;  grossly normal neurologically. Skin:  Intact without significant lesions or rashes. Cervical Nodes:  No significant cervical adenopathy. Psych:  Alert and cooperative. Normal affect.  LAB RESULTS: Recent Labs    06/10/17 0533  WBC 7.7  HGB 14.2  HCT 41.2  PLT 229   BMET Recent Labs    06/10/17 0533  NA 131*  K 4.0  CL 98*  CO2 24  GLUCOSE 359*  BUN 17  CREATININE 1.10  CALCIUM 8.9   LFT Recent Labs    06/10/17 0533  PROT 7.5  ALBUMIN 4.0  AST 50*  ALT 67*  ALKPHOS 107  BILITOT 0.6   PT/INR No results for input(s): LABPROT, INR in the last 72 hours.  STUDIES: Dg Chest 2 View  Result Date: 06/10/2017 CLINICAL DATA:  52 year old male with chest pain. EXAM: CHEST - 2 VIEW COMPARISON:  Chest radiograph dated 02/25/2017 FINDINGS: Shallow inspiration minimal bibasilar atelectasis. There is chronic bronchitic changes. No focal consolidation, pleural effusion, or pneumothorax. The cardiac silhouette is within normal limits. No acute osseous pathology. IMPRESSION: No active cardiopulmonary disease. Electronically Signed   By: Anner Crete M.D.   On: 06/10/2017 06:42      Impression / Plan:   COPE MARTE is a 52 y.o. y/o male admitted with chest pain , nausea, vomiting and hematemesis. Possible he had some esophagitis vs mallory weiss tear. Hb stable.   Plan  1. PPI 2. EGD today if possible otherwise tomorrow  I have discussed alternative  options, risks & benefits,  which include, but are not limited to, bleeding, infection, perforation,respiratory complication & drug reaction.  The patient agrees with this plan & written consent will be obtained.       Thank you for involving me in the care of this patient.      LOS: 0 days   Jonathon Bellows, MD  06/10/2017, 8:48 AM

## 2017-06-10 NOTE — ED Triage Notes (Addendum)
Pt to triage via w/c, appears uncomfortable; st since 2am while at work having mid CP accomp by [facility], N/V and "cold sweats"; denies hx of same; pt taken to room 26 via w/c by EDT Zach to be placed on card monitor for EKG and further monitoring

## 2017-06-10 NOTE — Anesthesia Postprocedure Evaluation (Signed)
Anesthesia Post Note  Patient: Gerald [name]  Procedure(s) Performed: ESOPHAGOGASTRODUODENOSCOPY (EGD) WITH PROPOFOL (N/A )  Patient location during evaluation: PACU Anesthesia Type: General Level of consciousness: awake and alert and oriented Pain management: pain level controlled Vital Signs Assessment: post-procedure vital signs reviewed and stable Respiratory status: spontaneous breathing Cardiovascular status: blood pressure returned to baseline Anesthetic complications: no     Last Vitals:  Vitals:   06/10/17 1628 06/10/17 1659  BP: 109/61 138/83  Pulse: 68 71  Resp: 17 18  Temp:  36.6 C  SpO2: 97% 96%    Last Pain:  Vitals:   06/10/17 1659  TempSrc: Oral  PainSc:                  [name]

## 2017-06-10 NOTE — ED Notes (Addendum)
Patient reports that he had one episode of nausea with vomiting at 6pm yesterday. States he had a couple of subsequent episodes of N/V with diaphoresis with chest pain starting approximately 2 am. Patient report increased pain with deep breath. States he has had a cold with coughing for last week. Reports similar symptoms with previous MI.

## 2017-06-10 NOTE — Progress Notes (Signed)
Patient back in room status post EGD. No complaints at time time. Will continue to monitor.

## 2017-06-10 NOTE — Anesthesia Preprocedure Evaluation (Addendum)
Anesthesia Evaluation  Patient identified by MRN, date of birth, ID band Patient awake    Reviewed: Allergy & Precautions, H&P , NPO status , Patient's Chart, lab work & pertinent test results, reviewed documented beta blocker date and time   History of Anesthesia Complications Negative for: history of anesthetic complications  Airway Mallampati: III  TM Distance: >3 FB Neck ROM: full    Dental  (+) Dental Advidsory Given, Poor Dentition   Pulmonary shortness of breath and with exertion, sleep apnea and Continuous Positive Airway Pressure Ventilation , neg COPD, neg recent URI,           Cardiovascular Exercise Tolerance: Good hypertension, (-) angina+ CAD, + Past MI and + Peripheral Vascular Disease  (-) Cardiac Stents negative cardio ROS  (-) dysrhythmias (-) Valvular Problems/Murmurs     Neuro/Psych PSYCHIATRIC DISORDERS Anxiety Depression negative neurological ROS     GI/Hepatic GERD  ,Likely NAFLD   Endo/Other  diabetesMorbid obesity  Renal/GU negative Renal ROS  negative genitourinary   Musculoskeletal   Abdominal   Peds  Hematology negative hematology ROS (+)   Anesthesia Other Findings Past Medical History: No date: Abnormal LFTs No date: Coronary artery disease     Comment:  Mild nonobstructive coronary artery disease on previous               cardiac catheterization most recently in August of 2013 No date: Diabetes mellitus without complication (HCC)     Comment:  borderline No date: Fundic gland polyps of stomach, benign No date: Gastritis No date: GERD (gastroesophageal reflux disease) No date: Hepatic steatosis No date: Hyperlipidemia No date: Hypertension 09/2011: Myocardial infarction ([facility]) No date: Nonischemic cardiomyopathy ([place])     Comment:  Previous ejection fraction of 35-40% on echo. 50% on               cardiac catheterization in August of 2013 No date: Obesity No date: Sleep  apnea   Reproductive/Obstetrics negative OB ROS                            Anesthesia Physical Anesthesia Plan  ASA: III  Anesthesia Plan: General   Post-op Pain Management:    Induction: Intravenous  PONV Risk Score and Plan: 2 and Propofol infusion  Airway Management Planned: Natural Airway and Nasal Cannula  Additional Equipment:   Intra-op Plan:   Post-operative Plan:   Informed Consent: I have reviewed the patients History and Physical, chart, labs and discussed the procedure including the risks, benefits and alternatives for the proposed anesthesia with the patient or authorized representative who has indicated his/her understanding and acceptance.   Dental Advisory Given  Plan Discussed with: Anesthesiologist, CRNA and Surgeon  Anesthesia Plan Comments:        Anesthesia Quick Evaluation

## 2017-06-10 NOTE — Op Note (Signed)
[facility] Gastroenterology Patient Name: Gerald [name] Procedure Date: 06/10/2017 3:42 PM MRN: [id number] Account #: [id number] Date of Birth: 1965/03/01 Admit Type: Inpatient Age: 52 Room: [facility] ENDO ROOM 1 Gender: Male Note Status: Finalized Procedure:            Upper GI endoscopy Indications:          Hematemesis Providers:            Jonathon Bellows MD, MD Referring MD:         Lavera Guise, MD (Referring MD) Medicines:            Monitored Anesthesia Care Complications:        No immediate complications. Procedure:            Pre-Anesthesia Assessment:                       - Prior to the procedure, a History and Physical was                        performed, and patient medications, allergies and                        sensitivities were reviewed. The patient's tolerance of                        previous anesthesia was reviewed.                       - The risks and benefits of the procedure and the                        sedation options and risks were discussed with the                        patient. All questions were answered and informed                        consent was obtained.                       - ASA Grade Assessment: II - A patient with mild                        systemic disease.                       After obtaining informed consent, the endoscope was                        passed under direct vision. Throughout the procedure,                        the patient's blood pressure, pulse, and oxygen                        saturations were monitored continuously. The Endoscope                        was introduced through the mouth, and advanced to the  third part of duodenum. The upper GI endoscopy was                        accomplished with ease. The patient tolerated the                        procedure well. Findings:      The examined duodenum was normal.      The esophagus was normal.      Many non-bleeding  superficial gastric ulcers with a clean ulcer base       (Forrest Class III) were found in the gastric antrum and in the       prepyloric region of the stomach. The largest lesion was 5 mm in largest       dimension. Biopsies were taken with a cold forceps for histology.      The cardia and gastric fundus were normal on retroflexion. Impression:           - Normal examined duodenum.                       - Normal esophagus.                       - Non-bleeding gastric ulcers with a clean ulcer base                        (Forrest Class III). Biopsied. Recommendation:       - Return patient to hospital ward for ongoing care.                       - Clear liquid diet today.                       - Continue present medications.                       - Await pathology results.                       - Check stool H pylori antigen                       No NSAID's                       If Hb stable and no further hematemesis can go home                        tomorrow                       - Repeat EGD in 8 weeks to check for healing of ulcers Procedure Code(s):    --- Professional ---                       [phone], Esophagogastroduodenoscopy, flexible, transoral;                        with biopsy, single or multiple Diagnosis Code(s):    --- Professional ---                       K25.9, Gastric ulcer, unspecified as acute  or chronic,                        without hemorrhage or perforation                       K92.0, Hematemesis CPT copyright 2017 [employer] Medical Association. All rights reserved. The codes documented in this report are preliminary and upon coder review may  be revised to meet current compliance requirements. Jonathon Bellows, MD Jonathon Bellows MD, MD 06/10/2017 4:03:57 PM This report has been signed electronically. Number of Addenda: 0 Note Initiated On: 06/10/2017 3:42 PM      [facility]

## 2017-06-10 NOTE — H&P (Signed)
Gerald [name] is an 52 y.o. male.   Chief Complaint: Chest pain HPI: The patient with past medical history of coronary artery disease status post MI, diabetes, hypertension and obstructive sleep apnea presents to the emergency department complaining of chest pain.  The pain began while the patient was at work.  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  It is located over his left chest and radiates to his left shoulder and back.  He also complains of nausea and vomiting.  The patient has had 3 episodes of dark-colored emesis some of which appeared to be blood.  The patient's pain has not eased at all in the 5 hours since it began.  The patient received nitro without relief in the emergency department.  He has been given a dose of fentanyl with mild improvement in pain.  First troponin is negative but due to ongoing chest pain and cardiac risk factors the emergency department staff called the hospitalist service for admission  Past Medical History:  Diagnosis Date  . Abnormal LFTs   . Coronary artery disease    Mild nonobstructive coronary artery disease on previous cardiac catheterization most recently in August of 2013  . Diabetes mellitus without complication (HCC)    borderline  . Fundic gland polyps of stomach, benign   . Gastritis   . GERD (gastroesophageal reflux disease)   . Hepatic steatosis   . Hyperlipidemia   . Hypertension   . Myocardial infarction ([place]) 09/2011  . Nonischemic cardiomyopathy (HCC)    Previous ejection fraction of 35-40% on echo. 50% on cardiac catheterization in August of 2013  . Obesity   . Sleep apnea     Past Surgical History:  Procedure Laterality Date  . CARDIAC CATHETERIZATION N/A 09/2011   [facility]; EF 50% with 30% mid LAD stenosis and no obstructive disease.  Marland Kitchen CARDIAC CATHETERIZATION  09/2010   [facility]; Mid LAD 40% stenosis; Mid Circumflex:Normal; Mid RCA; Normal  . CARDIAC CATHETERIZATION    . COLONOSCOPY    . KNEE  ARTHROSCOPY WITH MEDIAL MENISECTOMY Right 09/16/2012   Procedure: RIGHT KNEE ARTHROSCOPY WITH MEDIAL AND LATERAL MENISECTOMY, CHONDROPLASTY;  Surgeon: Ninetta Lights, MD;  Location: [place];  Service: Orthopedics;  Laterality: Right;  RIGHT KNEE SCOPE MEDIAL MENISCECTOMY  . LEFT HEART CATH AND CORONARY ANGIOGRAPHY N/A 06/11/2016   Procedure: Left Heart Cath and Coronary Angiography;  Surgeon: Minna Merritts, MD;  Location: [facility];  Service: Cardiovascular;  Laterality: N/A;    Family History  Problem Relation Age of Onset  . Heart disease Father 72       MI  . Heart attack Father   . Stomach cancer Father   . Hypertension Mother   . Breast cancer Mother    Social History:  reports that he has never smoked. He has never used smokeless tobacco. He reports that he drinks alcohol. He reports that he does not use drugs.  Allergies:  Allergies  Allergen Reactions  . Statins Rash         Prior to Admission medications   Medication Sig Start Date End Date Taking? Authorizing Provider  albuterol (PROVENTIL HFA;VENTOLIN HFA) 108 (90 Base) MCG/ACT inhaler Inhale 2 puffs into the lungs every 6 (six) hours as needed for wheezing or shortness of breath. 02/25/17  Yes Nance Pear, MD  aspirin EC 81 MG tablet Take 81 mg by mouth daily.   Yes [provider]  benzonatate (TESSALON) 200 MG capsule Take 1 capsule (200 mg total) by mouth 3 (three) times daily as needed for cough. 06/08/17  Yes Boscia, Heather E, NP  citalopram (CELEXA) 40 MG tablet TAKE 1 TABLET BY MOUTH EVERY DAY FOR GAD 04/22/17  Yes [provider]  Dulaglutide (TRULICITY) 1.5 FX/9.0WI SOPN Inject 1.5 mg into the skin once a week.   Yes [provider]  guaiFENesin (MUCINEX) 600 MG 12 hr tablet Take 1 tablet (600 mg total) by mouth 2 (two) times daily as needed for cough or to loosen phlegm. 01/29/17  Yes Boscia, Greer Ee, NP  ibuprofen (ADVIL,MOTRIN) 800 MG tablet Take 1  tablet (800 mg total) by mouth every 8 (eight) hours as needed. Patient taking differently: Take 800 mg by mouth every 8 (eight) hours as needed for fever or moderate pain.  01/10/17  Yes [name], MD  levofloxacin (LEVAQUIN) 500 MG tablet Take 1 tablet (500 mg total) by mouth daily. 06/08/17  Yes Boscia, Greer Ee, NP  lisinopril-hydrochlorothiazide (PRINZIDE,ZESTORETIC) 20-25 MG tablet Take 1 tablet by mouth daily. 05/25/16  Yes [provider]  ondansetron (ZOFRAN ODT) 4 MG disintegrating tablet Take 1 tablet (4 mg total) by mouth every 8 (eight) hours as needed for nausea or vomiting. 12/01/15  Yes Alfred Levins, [place], MD  albuterol (PROVENTIL HFA) 108 (90 BASE) MCG/ACT inhaler Inhale 2 puffs into the lungs every 4 (four) hours as needed for wheezing or shortness of breath. Patient not taking: Reported on 05/26/2017 12/30/14   Carrie Mew, MD  nitroGLYCERIN (NITROSTAT) 0.4 MG SL tablet Place 1 tablet (0.4 mg total) under the tongue every 5 (five) minutes as needed for chest pain. Patient not taking: Reported on 06/10/2017 11/08/13   Lucille Passy, MD  omeprazole (PRILOSEC) 40 MG capsule TAKE 1 CAPSULE (40 MG TOTAL) BY MOUTH DAILY. Patient not taking: Reported on 06/10/2017 04/29/16   Irene Shipper, MD  sulfamethoxazole-trimethoprim (BACTRIM DS,SEPTRA DS) 800-160 MG tablet Take 1 tablet by mouth 2 (two) times daily. Patient not taking: Reported on 06/10/2017 05/26/17   Ronnell Freshwater, NP     Results for orders placed or performed during the hospital encounter of 06/10/17 (from the past 48 hour(s))  CBC     Status: None   Collection Time: 06/10/17  5:33 AM  Result Value Ref Range   WBC 7.7 3.8 - 10.6 K/uL   RBC 4.77 4.40 - 5.90 MIL/uL   Hemoglobin 14.2 13.0 - 18.0 g/dL   HCT 41.2 40.0 - 52.0 %   MCV 86.4 80.0 - 100.0 fL   MCH 29.7 26.0 - 34.0 pg   MCHC 34.4 32.0 - 36.0 g/dL   RDW 14.0 11.5 - 14.5 %   Platelets 229 150 - 440 K/uL    Comment: Performed at [facility], [address] [place], [place] [address]  Comprehensive metabolic panel     Status: Abnormal   Collection Time: 06/10/17  5:33 AM  Result Value Ref Range   Sodium 131 (L) 135 - 145 mmol/L   Potassium 4.0 3.5 - 5.1 mmol/L   Chloride 98 (L) 101 - 111 mmol/L   CO2 24 22 - 32 mmol/L   Glucose, Bld 359 (H) 65 - 99 mg/dL   BUN 17 6 - 20 mg/dL   Creatinine, Ser 1.10 0.61 - 1.24 mg/dL   Calcium 8.9 8.9 - 10.3 mg/dL   Total Protein 7.5 6.5 - 8.1 g/dL   Albumin 4.0 3.5 - 5.0 g/dL  AST 50 (H) 15 - 41 U/L   ALT 67 (H) 17 - 63 U/L   Alkaline Phosphatase 107 38 - 126 U/L   Total Bilirubin 0.6 0.3 - 1.2 mg/dL   GFR calc non Af Amer >60 >60 mL/min   GFR calc Af Amer >60 >60 mL/min    Comment: (NOTE) The eGFR has been calculated using the CKD EPI equation. This calculation has not been validated in all clinical situations. eGFR's persistently <60 mL/min signify possible Chronic Kidney Disease.    Anion gap 9 5 - 15    Comment: Performed at [facility], [address] [place], [place] [address]  Troponin I     Status: None   Collection Time: 06/10/17  5:33 AM  Result Value Ref Range   Troponin I <0.03 <0.03 ng/mL    Comment: Performed at [facility], [address] [place], [place] [address]   Dg Chest 2 View  Result Date: 06/10/2017 CLINICAL DATA:  52 year old male with chest pain. EXAM: CHEST - 2 VIEW COMPARISON:  Chest radiograph dated 02/25/2017 FINDINGS: Shallow inspiration minimal bibasilar atelectasis. There is chronic bronchitic changes. No focal consolidation, pleural effusion, or pneumothorax. The cardiac silhouette is within normal limits. No acute osseous pathology. IMPRESSION: No active cardiopulmonary disease. Electronically Signed   By: Anner Crete M.D.   On: 06/10/2017 06:42    Review of Systems  Constitutional: Negative for chills and fever.  HENT: Negative for sore throat and tinnitus.   Eyes: Negative for blurred vision and redness.   Respiratory: Negative for cough and shortness of breath.   Cardiovascular: Positive for chest pain. Negative for palpitations, orthopnea and PND.  Gastrointestinal: Negative for abdominal pain, diarrhea, nausea and vomiting.  Genitourinary: Negative for dysuria, frequency and urgency.  Musculoskeletal: Negative for joint pain and myalgias.  Skin: Negative for rash.       No lesions  Neurological: Negative for speech change, focal weakness and weakness.  Endo/Heme/Allergies: Does not bruise/bleed easily.       No temperature intolerance  Psychiatric/Behavioral: Negative for depression and suicidal ideas.    Blood pressure (!) 131/103, pulse 86, temperature 98.6 F (37 C), temperature source Oral, resp. rate (!) 24, height 6' 2"  (1.88 m), weight (!) 154.2 kg (340 lb), SpO2 93 %. Physical Exam  Vitals reviewed. Constitutional: He is oriented to person, place, and time. He appears well-developed and well-nourished.  HENT:  Head: Normocephalic and atraumatic.  Mouth/Throat: Oropharynx is clear and moist.  Eyes: Pupils are equal, round, and reactive to light. Conjunctivae and EOM are normal. No scleral icterus.  Neck: Normal range of motion. Neck supple. No JVD present. No tracheal deviation present. No thyromegaly present.  Cardiovascular: Normal rate, regular rhythm and normal heart sounds. Exam reveals no gallop and no friction rub.  No murmur heard. Respiratory: Breath sounds normal. No respiratory distress.  GI: Soft. Bowel sounds are normal. He exhibits no distension. There is no tenderness.  Genitourinary:  Genitourinary Comments: Deferred  Musculoskeletal: Normal range of motion. He exhibits no edema.  Lymphadenopathy:    He has no cervical adenopathy.  Neurological: He is alert and oriented to person, place, and time. No cranial nerve deficit.  Skin: Skin is warm and dry. No rash noted. No erythema.  Psychiatric: He has a normal mood and affect. His behavior is normal. Judgment  and thought content normal.     Assessment/Plan This is a 52 year old male admitted for chest pain. 1.  Chest pain: Ongoing; atypical.  Differential diagnosis includes gastric pain secondary to reflux and/or GI bleeding.  The patient has had questionable hematemesis.  Continue to follow cardiac biomarkers.  Monitor telemetry.  No EKG changes to indicate ischemia at this time.  Consult cardiology. 2.  Hypertension: Uncontrolled; continue lisinopril with hydrochlorothiazide.  Labetalol as needed. 3.  Diabetes mellitus type 2: Hold Trulicity.  Sliding cell insulin while hospitalized. 4.  Obstructive sleep apnea: CPAP at night per home settings. 5.  Cough: The patient has been on Levaquin for a few days.  No evidence of pneumonia on chest x-ray.  Oxygen saturations are normal. 6.  DVT prophylaxis: [place] 7.  GI prophylaxis: IV Pepcid The patient is a full code.  Time spent on admission orders and patient care approximately 45 minutes  Harrie Foreman, MD 06/10/2017, 7:08 AM

## 2017-06-10 NOTE — H&P (Signed)
Gerald Bellows, MD [address] [place], [place], [place], [address] [id number], [place], [place], [place], [address] Phone: [phone]  Fax: [phone]  Primary Care Physician:  Ronnell Freshwater, NP   Pre-Procedure History & Physical: HPI:  LEXANDER [name] is a 52 y.o. male is here for an endoscopy    Past Medical History:  Diagnosis Date  . Abnormal LFTs   . Coronary artery disease    Mild nonobstructive coronary artery disease on previous cardiac catheterization most recently in August of 2013  . Diabetes mellitus without complication (HCC)    borderline  . Fundic gland polyps of stomach, benign   . Gastritis   . GERD (gastroesophageal reflux disease)   . Hepatic steatosis   . Hyperlipidemia   . Hypertension   . Myocardial infarction ([place]) 09/2011  . Nonischemic cardiomyopathy (HCC)    Previous ejection fraction of 35-40% on echo. 50% on cardiac catheterization in August of 2013  . Obesity   . Sleep apnea     Past Surgical History:  Procedure Laterality Date  . CARDIAC CATHETERIZATION N/A 09/2011   [facility]; EF 50% with 30% mid LAD stenosis and no obstructive disease.  Marland Kitchen CARDIAC CATHETERIZATION  09/2010   [facility]; Mid LAD 40% stenosis; Mid Circumflex:Normal; Mid RCA; Normal  . CARDIAC CATHETERIZATION    . COLONOSCOPY    . KNEE ARTHROSCOPY WITH MEDIAL MENISECTOMY Right 09/16/2012   Procedure: RIGHT KNEE ARTHROSCOPY WITH MEDIAL AND LATERAL MENISECTOMY, CHONDROPLASTY;  Surgeon: Ninetta Lights, MD;  Location: [place];  Service: Orthopedics;  Laterality: Right;  RIGHT KNEE SCOPE MEDIAL MENISCECTOMY  . LEFT HEART CATH AND CORONARY ANGIOGRAPHY N/A 06/11/2016   Procedure: Left Heart Cath and Coronary Angiography;  Surgeon: Minna Merritts, MD;  Location: [place] [facility];  Service: Cardiovascular;  Laterality: N/A;    Prior to Admission medications   Medication Sig Start Date End Date Taking? Authorizing Provider  albuterol (PROVENTIL  HFA;VENTOLIN HFA) 108 (90 Base) MCG/ACT inhaler Inhale 2 puffs into the lungs every 6 (six) hours as needed for wheezing or shortness of breath. 02/25/17  Yes Nance Pear, MD  aspirin EC 81 MG tablet Take 81 mg by mouth daily.   Yes [provider]  benzonatate (TESSALON) 200 MG capsule Take 1 capsule (200 mg total) by mouth 3 (three) times daily as needed for cough. 06/08/17  Yes Boscia, Heather E, NP  citalopram (CELEXA) 40 MG tablet TAKE 1 TABLET BY MOUTH EVERY DAY FOR GAD 04/22/17  Yes [provider]  Dulaglutide (TRULICITY) 1.5 GE/3.6OQ SOPN Inject 1.5 mg into the skin once a week.   Yes [provider]  guaiFENesin (MUCINEX) 600 MG 12 hr tablet Take 1 tablet (600 mg total) by mouth 2 (two) times daily as needed for cough or to loosen phlegm. 01/29/17  Yes Boscia, Greer Ee, NP  ibuprofen (ADVIL,MOTRIN) 800 MG tablet Take 1 tablet (800 mg total) by mouth every 8 (eight) hours as needed. Patient taking differently: Take 800 mg by mouth every 8 (eight) hours as needed for fever or moderate pain.  01/10/17  Yes [name], MD  levofloxacin (LEVAQUIN) 500 MG tablet Take 1 tablet (500 mg total) by mouth daily. 06/08/17  Yes Boscia, Greer Ee, NP  lisinopril-hydrochlorothiazide (PRINZIDE,ZESTORETIC) 20-25 MG tablet Take 1 tablet by mouth daily. 05/25/16  Yes [provider]  ondansetron (ZOFRAN ODT) 4 MG disintegrating tablet Take 1 tablet (4 mg total) by mouth every 8 (  eight) hours as needed for nausea or vomiting. 12/01/15  Yes Alfred Levins, [place], MD  albuterol (PROVENTIL HFA) 108 (90 BASE) MCG/ACT inhaler Inhale 2 puffs into the lungs every 4 (four) hours as needed for wheezing or shortness of breath. Patient not taking: Reported on 05/26/2017 12/30/14   Carrie Mew, MD  nitroGLYCERIN (NITROSTAT) 0.4 MG SL tablet Place 1 tablet (0.4 mg total) under the tongue every 5 (five) minutes as needed for chest pain. Patient not taking: Reported on 06/10/2017  11/08/13   Lucille Passy, MD  omeprazole (PRILOSEC) 40 MG capsule TAKE 1 CAPSULE (40 MG TOTAL) BY MOUTH DAILY. Patient not taking: Reported on 06/10/2017 04/29/16   Irene Shipper, MD  sulfamethoxazole-trimethoprim (BACTRIM DS,SEPTRA DS) 800-160 MG tablet Take 1 tablet by mouth 2 (two) times daily. Patient not taking: Reported on 06/10/2017 05/26/17   Ronnell Freshwater, NP    Allergies as of 06/10/2017 - Review Complete 06/10/2017  Allergen Reaction Noted  . Statins Rash 04/15/2012    Family History  Problem Relation Age of Onset  . Heart disease Father 28       MI  . Heart attack Father   . Stomach cancer Father   . Hypertension Mother   . Breast cancer Mother     Social History   Socioeconomic History  . Marital status: Married    Spouse name: Not on file  . Number of children: 3  . Years of education: Not on file  . Highest education level: Not on file  Occupational History  . Occupation: Librarian, academic  Social Needs  . Financial resource strain: Not on file  . Food insecurity:    Worry: Not on file    Inability: Not on file  . Transportation needs:    Medical: Not on file    Non-medical: Not on file  Tobacco Use  . Smoking status: Never Smoker  . Smokeless tobacco: Never Used  Substance and Sexual Activity  . Alcohol use: Yes    Alcohol/week: 0.0 oz    Comment: rare  . Drug use: No  . Sexual activity: Not on file  Lifestyle  . Physical activity:    Days per week: Not on file    Minutes per session: Not on file  . Stress: Not on file  Relationships  . Social connections:    Talks on phone: Not on file    Gets together: Not on file    Attends religious service: Not on file    Active member of club or organization: Not on file    Attends meetings of clubs or organizations: Not on file    Relationship status: Not on file  . Intimate partner violence:    Fear of current or ex partner: Not on file    Emotionally abused: Not on file    Physically abused: Not  on file    Forced sexual activity: Not on file  Other Topics Concern  . Not on file  Social History Narrative   Married.  19 yo son.   Wife on disability for bipolar disorder.      Review of Systems: See HPI, otherwise negative ROS  Physical Exam: BP 138/76   Pulse 70   Temp (!) 96.6 F (35.9 C) (Tympanic)   Resp 20   Ht 6\' 2"  (1.88 m)   Wt (!) 330 lb (149.7 kg)   SpO2 98%   BMI 42.37 kg/m  General:   Alert,  pleasant and cooperative in NAD Head:  Normocephalic and atraumatic. Neck:  Supple; no masses or thyromegaly. Lungs:  Clear throughout to auscultation, normal respiratory effort.    Heart:  +S1, +S2, Regular rate and rhythm, No edema. Abdomen:  Soft, nontender and nondistended. Normal bowel sounds, without guarding, and without rebound.   Neurologic:  Alert and  oriented x4;  grossly normal neurologically.  Impression/Plan: SHASHWAT CLEARY is here for an endoscopy  to be performed for  evaluation of GI bleed    Risks, benefits, limitations, and alternatives regarding endoscopy have been reviewed with the patient.  Questions have been answered.  All parties agreeable.   Gerald Bellows, MD  06/10/2017, 3:46 PM

## 2017-06-10 NOTE — Progress Notes (Signed)
Inpatient Diabetes Program Recommendations  AACE/ADA: New Consensus Statement on Inpatient Glycemic Control (2015)  Target Ranges:  Prepandial:   less than 140 mg/dL      Peak postprandial:   less than 180 mg/dL (1-2 hours)      Critically ill patients:  140 - 180 mg/dL   Results for GILES, CURRIE (MRN [id number]) as of 06/10/2017 09:17  Ref. Range 06/10/2017 05:33  Glucose Latest Ref Range: 65 - 99 mg/dL 359 (H)   Review of Glycemic Control  Diabetes history: DM2 Outpatient Diabetes medications: Trulicity 1.5 mg weekly Current orders for Inpatient glycemic control: Novolog 0-9 units TID with meals, Novolog 0-5 units QHS  Inpatient Diabetes Program Recommendations: Insulin - Basal: Initial glucose 359 mg/dl on 06/10/17. Please consider ordering Lantus 15 units daily (based on 149 kg x 0.2 units). HgbA1C: A1C in process.  Thanks, Barnie Alderman, RN, MSN, CDE Diabetes Coordinator Inpatient Diabetes Program [phone] (Team Pager from 8am to 5pm)

## 2017-06-10 NOTE — Progress Notes (Signed)
[facility] [place] NAME: Gerald [name]    MR#:  [id number]  DATE OF BIRTH:  1965-11-18  SUBJECTIVE:  CHIEF COMPLAINT:   Chief Complaint  Patient presents with  . Chest Pain    History of coronary artery disease but no stent, came with chest pain and had dark-colored vomiting with some blood multiple times since last night.  REVIEW OF SYSTEMS:  CONSTITUTIONAL: No fever, have fatigue or weakness.  EYES: No blurred or double vision.  EARS, NOSE, AND THROAT: No tinnitus or ear pain.  RESPIRATORY: No cough, shortness of breath, wheezing or hemoptysis.  CARDIOVASCULAR: have chest pain,no orthopnea, edema.  GASTROINTESTINAL: No nausea, have dark-colored vomiting,no diarrhea or abdominal pain.  GENITOURINARY: No dysuria, hematuria.  ENDOCRINE: No polyuria, nocturia,  HEMATOLOGY: No anemia, easy bruising or bleeding SKIN: No rash or lesion. MUSCULOSKELETAL: No joint pain or arthritis.   NEUROLOGIC: No tingling, numbness, weakness.  PSYCHIATRY: No anxiety or depression.   ROS  DRUG ALLERGIES:   Allergies  Allergen Reactions  . Statins Rash         VITALS:  Blood pressure 138/76, pulse 70, temperature (!) 96.6 F (35.9 C), temperature source Tympanic, resp. rate 20, height 6\' 2"  (1.88 m), weight (!) 149.7 kg (330 lb), SpO2 98 %.  PHYSICAL EXAMINATION:  GENERAL:  52 y.o.-year-old patient lying in the bed with no acute distress.  EYES: Pupils equal, round, reactive to light and accommodation. No scleral icterus. Extraocular muscles intact.  HEENT: Head atraumatic, normocephalic. Oropharynx and nasopharynx clear.  NECK:  Supple, no jugular venous distention. No thyroid enlargement, no tenderness.  LUNGS: Normal breath sounds bilaterally, no wheezing, rales,rhonchi or crepitation. No use of accessory muscles of respiration.  CARDIOVASCULAR: S1, S2 normal. No murmurs, rubs, or gallops.  ABDOMEN: Soft, epigastric tender, nondistended.  Bowel sounds present. No organomegaly or mass.  EXTREMITIES: No pedal edema, cyanosis, or clubbing.  NEUROLOGIC: Cranial nerves II through XII are intact. Muscle strength 5/5 in all extremities. Sensation intact. Gait not checked.  PSYCHIATRIC: The patient is alert and oriented x 3.  SKIN: No obvious rash, lesion, or ulcer.   Physical Exam LABORATORY PANEL:   CBC Recent Labs  Lab 06/10/17 1128  WBC 7.5  HGB 13.1  HCT 38.3*  PLT 230   ------------------------------------------------------------------------------------------------------------------  Chemistries  Recent Labs  Lab 06/10/17 0533  NA 131*  K 4.0  CL 98*  CO2 24  GLUCOSE 359*  BUN 17  CREATININE 1.10  CALCIUM 8.9  AST 50*  ALT 67*  ALKPHOS 107  BILITOT 0.6   ------------------------------------------------------------------------------------------------------------------  Cardiac Enzymes Recent Labs  Lab 06/10/17 0942 06/10/17 1400  TROPONINI <0.03 <0.03   ------------------------------------------------------------------------------------------------------------------  RADIOLOGY:  Dg Chest 2 View  Result Date: 06/10/2017 CLINICAL DATA:  52 year old male with chest pain. EXAM: CHEST - 2 VIEW COMPARISON:  Chest radiograph dated 02/25/2017 FINDINGS: Shallow inspiration minimal bibasilar atelectasis. There is chronic bronchitic changes. No focal consolidation, pleural effusion, or pneumothorax. The cardiac silhouette is within normal limits. No acute osseous pathology. IMPRESSION: No active cardiopulmonary disease. Electronically Signed   By: Anner Crete M.D.   On: 06/10/2017 06:42    ASSESSMENT AND PLAN:   Active Problems:   Chest pain  *  Chest pain: atypical.  Differential diagnosis includes gastric pain secondary to reflux and/or GI bleeding.  The patient has had questionable hematemesis.  Continue to follow cardiac biomarkers- negative.  Monitor telemetry.  No EKG changes to indicate ischemia  at this time.     Cardiology consult appreciated, he had catheterization done last year with some blockages up to 30% but no further workup needed at this time as per them.  * GI bleed   Most likely peptic ulcer disease, versus Mallory-Weiss tear, esophagitis   I stopped Lovenox and aspirin.   Monitor serial hemoglobin, currently stable.   Started IV PPI, GI consult and possible endoscopy.  *   Hypertension: Uncontrolled; continue lisinopril with hydrochlorothiazide.  Labetalol as needed. *   Diabetes mellitus type 2: Hold Trulicity.  Sliding cell insulin while hospitalized. *  Obstructive sleep apnea: CPAP at night per home settings. *  Cough: The patient has been on Levaquin for a few days.  No evidence of pneumonia on chest x-ray.  Oxygen saturations are normal. *   DVT prophylaxis: we'll hold Lovenox because of active GI bleed. *  GI prophylaxis: IV PPI    All the records are reviewed and case discussed with Care Management/Social Workerr. Management plans discussed with the patient, family and they are in agreement.  CODE STATUS: full.  TOTAL TIME TAKING CARE OF THIS PATIENT: 35 minutes.     POSSIBLE D/C IN 1-2 DAYS, DEPENDING ON CLINICAL CONDITION.   Vaughan Basta M.D on 06/10/2017   Between 7am to 6pm - Pager - [phone]  After 6pm go to [email or web address] - password [email or web address] [facility]  Office  [phone]  CC: Primary care physician; Ronnell Freshwater, NP  Note: This dictation was prepared with Dragon dictation along with smaller phrase technology. Any transcriptional errors that result from this process are unintentional.

## 2017-06-10 NOTE — Anesthesia Procedure Notes (Signed)
Date/Time: 06/10/2017 3:59 PM Performed by: Nelda Marseille, CRNA Pre-anesthesia Checklist: Patient identified, Emergency Drugs available, Suction available, Patient being monitored and Timeout performed Oxygen Delivery Method: Nasal cannula

## 2017-06-11 ENCOUNTER — Encounter: Payer: Self-pay | Admitting: Gastroenterology

## 2017-06-11 DIAGNOSIS — G4733 Obstructive sleep apnea (adult) (pediatric): Secondary | ICD-10-CM | POA: Diagnosis not present

## 2017-06-11 DIAGNOSIS — I25118 Atherosclerotic heart disease of native coronary artery with other forms of angina pectoris: Secondary | ICD-10-CM

## 2017-06-11 DIAGNOSIS — K259 Gastric ulcer, unspecified as acute or chronic, without hemorrhage or perforation: Secondary | ICD-10-CM | POA: Diagnosis not present

## 2017-06-11 DIAGNOSIS — K922 Gastrointestinal hemorrhage, unspecified: Secondary | ICD-10-CM | POA: Diagnosis not present

## 2017-06-11 DIAGNOSIS — R079 Chest pain, unspecified: Secondary | ICD-10-CM | POA: Diagnosis not present

## 2017-06-11 DIAGNOSIS — I10 Essential (primary) hypertension: Secondary | ICD-10-CM | POA: Diagnosis not present

## 2017-06-11 LAB — HEMOGLOBIN
HEMOGLOBIN: 13.5 g/dL (ref 13.0–18.0)
Hemoglobin: 13.9 g/dL (ref 13.0–18.0)

## 2017-06-11 LAB — GLUCOSE, CAPILLARY
Glucose-Capillary: 153 mg/dL — ABNORMAL HIGH (ref 65–99)
Glucose-Capillary: 283 mg/dL — ABNORMAL HIGH (ref 65–99)

## 2017-06-11 MED ORDER — PANTOPRAZOLE SODIUM 40 MG PO TBEC: 40.0000 mg | DELAYED_RELEASE_TABLET | Freq: Two times a day (BID) | ORAL | Status: DC

## 2017-06-11 MED ORDER — PANTOPRAZOLE SODIUM 40 MG PO TBEC: 40.0000 mg | DELAYED_RELEASE_TABLET | Freq: Two times a day (BID) | ORAL | 0 refills | Status: DC

## 2017-06-11 NOTE — Progress Notes (Addendum)
Inpatient Diabetes Program Recommendations  AACE/ADA: New Consensus Statement on Inpatient Glycemic Control (2015)  Target Ranges:  Prepandial:   less than 140 mg/dL      Peak postprandial:   less than 180 mg/dL (1-2 hours)      Critically ill patients:  140 - 180 mg/dL   Lab Results  Component Value Date   GLUCAP 153 (H) 06/11/2017   HGBA1C 11.0 (H) 06/10/2017    Review of Glycemic Control Results for Gerald [name], Gerald [name] (MRN [id number]) as of 06/11/2017 09:43  Ref. Range 06/10/2017 08:23 06/10/2017 11:06 06/10/2017 16:59 06/10/2017 22:52 06/11/2017 07:34  Glucose-Capillary Latest Ref Range: 65 - 99 mg/dL 357 (H) 288 (H) 234 (H) 204 (H) 153 (H)   Diabetes history: DM2 Outpatient Diabetes medications: Trulicity 1.5 mg weekly Current orders for Inpatient glycemic control: Novolog 0-9 units TID with meals, Novolog 0-5 units QHS  Inpatient Diabetes Program Recommendations:   Insulin - Basal: Please consider ordering Lantus 15 units daily (based on 149 kg x 0.2 units). HgbA1C:11.0 (average blood glucose 269 over the past 2-3 months). Based on A1c -consider Lantus 15 units daily for home prescription. Will speak with patient. 11:15 Addendum. Spoke with patient and wife @ bedside. Reviewed A1c is currently 11.0 (average blood glucose 269 over the past 2-3 months). Patient states he has been on Levemir 60-70 units in the past along with Metformin but his PCP took both off of regimen due to A1c dropped to approx. 6.0. Page to Dr. Bridgett Larsson to give update. Patient states he drinks 1/2 cut sweet tea, 8 oz orange juice and water @ home. Reviewed with patient to change sugary drinks to sugar free and limit sugar and carbohydrates. Patient was also on steroids approx. 3 months ago that would also cause increase in blood glucose.  Thank you, Nani Gasser. Gerald Sadlowski, RN, MSN, CDE  Diabetes Coordinator Inpatient Glycemic Control Team Team Pager [phone] (8am-5pm) 06/11/2017 11:40 AM   Thank you, Nani Gasser. Gerald Jeffords, RN,  MSN, CDE  Diabetes Coordinator Inpatient Glycemic Control Team Team Pager [phone] (8am-5pm) 06/11/2017 9:44 AM

## 2017-06-11 NOTE — Discharge Instructions (Signed)
Avoid NSAIDS 

## 2017-06-11 NOTE — Progress Notes (Signed)
Gerald [name] to be D/C'd Home per MD order. Patient given discharge teaching and paperwork regarding medications, diet, follow-up appointments and activity. Patient understanding verbalized. No questions or complaints at this time. Skin condition as charted. IV and telemetry removed prior to leaving.  No further needs by Care Management/Social Work. Prescription handed to patient.  An After Visit Summary was printed and given to the patient.     Terrilyn Saver

## 2017-06-11 NOTE — Progress Notes (Signed)
Progress Note  Patient Name: Gerald [name] Date of Encounter: 06/11/2017  Primary Cardiologist: No primary care provider on file.   Subjective   No chest pain overnight EGD yesterday with nonbleeding ulcers Biopsies taken for H. pylori testing No further emesis  Inpatient Medications    Scheduled Meds: . citalopram  40 mg Oral Daily  . docusate sodium  100 mg Oral BID  . hydrochlorothiazide  25 mg Oral Daily  . insulin aspart  0-5 Units Subcutaneous QHS  . insulin aspart  0-9 Units Subcutaneous TID WC  . levofloxacin  500 mg Oral Daily  . lisinopril  20 mg Oral Daily  . pantoprazole  40 mg Oral BID AC   Continuous Infusions:  PRN Meds: acetaminophen **OR** acetaminophen, albuterol, benzonatate, guaiFENesin, morphine injection, nitroGLYCERIN, ondansetron **OR** ondansetron (ZOFRAN) IV   Vital Signs    Vitals:   06/10/17 1659 06/10/17 2033 06/11/17 0340 06/11/17 0735  BP: 138/83  114/62 133/74  Pulse: 71 80 70 70  Resp: 18 18 18 18   Temp: 97.9 F (36.6 C) 98.2 F (36.8 C) 98.2 F (36.8 C) 98.2 F (36.8 C)  TempSrc: Oral Oral Oral Oral  SpO2: 96% 98% 96% 96%  Weight:      Height:        Intake/Output Summary (Last 24 hours) at 06/11/2017 0857 Last data filed at 06/11/2017 0340 Gross per 24 hour  Intake 200 ml  Output 1200 ml  Net -1000 ml   Filed Weights   06/10/17 0526 06/10/17 0806 06/10/17 1504  Weight: (!) 340 lb (154.2 kg) (!) 329 lb 6.4 oz (149.4 kg) (!) 330 lb (149.7 kg)    Telemetry    Normal sinus rhythm- Personally Reviewed  ECG      Physical Exam   Constitutional:  oriented to person, place, and time. No distress.  HENT:  Head: Normocephalic and atraumatic.  Eyes:  no discharge. No scleral icterus.  Neck: Normal range of motion. Neck supple. No JVD present.  Cardiovascular: Normal rate, regular rhythm, normal heart sounds and intact distal pulses. Exam reveals no gallop and no friction rub. No edema No murmur  heard. Pulmonary/Chest: Effort normal and breath sounds normal. No stridor. No respiratory distress.  no wheezes.  no rales.  no tenderness.  Abdominal: Soft.  no distension.  no tenderness.  Musculoskeletal: Normal range of motion.  no  tenderness or deformity.  Neurological:  normal muscle tone. Coordination normal. No atrophy Skin: Skin is warm and dry. No rash noted. not diaphoretic.  Psychiatric:  normal mood and affect. behavior is normal. Thought content normal.   Labs    Chemistry Recent Labs  Lab 06/10/17 0533  NA 131*  K 4.0  CL 98*  CO2 24  GLUCOSE 359*  BUN 17  CREATININE 1.10  CALCIUM 8.9  PROT 7.5  ALBUMIN 4.0  AST 50*  ALT 67*  ALKPHOS 107  BILITOT 0.6  GFRNONAA >60  GFRAA >60  ANIONGAP 9     Hematology Recent Labs  Lab 06/10/17 0533 06/10/17 1128 06/10/17 1714 06/10/17 2327 06/11/17 0808  WBC 7.7 7.5  --   --   --   RBC 4.77 4.45  --   --   --   HGB 14.2 13.1 13.4 13.5 13.9  HCT 41.2 38.3*  --   --   --   MCV 86.4 86.1  --   --   --   MCH 29.7 29.5  --   --   --  MCHC 34.4 34.2  --   --   --   RDW 14.0 14.1  --   --   --   PLT 229 230  --   --   --     Cardiac Enzymes Recent Labs  Lab 06/10/17 0533 06/10/17 0942 06/10/17 1400 06/10/17 2001  TROPONINI <0.03 <0.03 <0.03 <0.03   No results for input(s): TROPIPOC in the last 168 hours.   BNPNo results for input(s): BNP, PROBNP in the last 168 hours.   DDimer No results for input(s): DDIMER in the last 168 hours.   Radiology    Dg Chest 2 View  Result Date: 06/10/2017 CLINICAL DATA:  52 year old male with chest pain. EXAM: CHEST - 2 VIEW COMPARISON:  Chest radiograph dated 02/25/2017 FINDINGS: Shallow inspiration minimal bibasilar atelectasis. There is chronic bronchitic changes. No focal consolidation, pleural effusion, or pneumothorax. The cardiac silhouette is within normal limits. No acute osseous pathology. IMPRESSION: No active cardiopulmonary disease. Electronically Signed   By:  Anner Crete M.D.   On: 06/10/2017 06:42    Cardiac Studies     Patient Profile     Rhyland Hinderliter Dickensis a 52 y.o.malewith a history of Obesity Chronic chest pain nonobstructive CAD with 30% mid LAD stenosis by catheterization in2013,2016, 2018 nonischemic cardiomyopathy with normalization of LV function,  type 2 diabetes mellitus,  hypertension,  hyperlipidemia,  obstructive sleep apnea on CPAP,  morbid obesity. Hospital admission May 2018 for chest pain Cardiac catheterization May 2018 showing nonobstructive coronary disease Who presents to the hospital with nausea vomiting chest pain bloody emesis/black   Assessment & Plan     1. Nausea vomiting/hematemesis  upper GI bleed Seen by GI had EGD showing nonbleeding ulcers Further work-up per GI  2 chest pain Presenting after vomiting Suspect musculoskeletal, unable to exclude Mallory-Weiss tear No further cardiac work-up needed at this time  3) DM 2 Previously on Trulicity Started on sliding scale Stable  4) obstructive sleep apnea on CPAP Continue CPAP in-house  5) chronic cough Started on Levaquin past several days  will defer to medicine service Less likely cardiac, does not appear to have fluid overload   Total encounter time more than 25 minutes  Greater than 50% was spent in counseling and coordination of care with the patient    For questions or updates, please contact [place] Please consult [email or web address] for contact info under Cardiology/STEMI.      Signed, Ida Rogue, MD  06/11/2017, 8:57 AM

## 2017-06-11 NOTE — Progress Notes (Signed)
[place] [facility] [place] was admitted to the [facility] on 06/10/2017 and Discharged  06/11/2017 and should be excused from work/school   for 6 days starting 06/10/2017 , may return to work/school without any restrictions.  Demetrios Loll M.D on 06/11/2017,at 8:51 AM  [place] [facility]  [phone]

## 2017-06-12 LAB — SURGICAL PATHOLOGY

## 2017-06-12 NOTE — Discharge Summary (Signed)
[place] at [place] NAME: Gerald [name]    MR#:  [id number]  DATE OF BIRTH:  1965-08-16  DATE OF ADMISSION:  06/10/2017   ADMITTING PHYSICIAN: Harrie Foreman, MD  DATE OF DISCHARGE: 06/11/2017  1:00 PM  PRIMARY CARE PHYSICIAN: Ronnell Freshwater, NP   ADMISSION DIAGNOSIS:  Morbid obesity ([place]) [E66.01] Unstable angina ([place]) [I20.0] Coronary artery disease involving native heart with unstable angina pectoris, unspecified vessel or lesion type ([place]) [I25.110] Hypertension, unspecified type [I10] DISCHARGE DIAGNOSIS:  Active Problems:   Chest pain  SECONDARY DIAGNOSIS:   Past Medical History:  Diagnosis Date  . Abnormal LFTs   . Coronary artery disease    Mild nonobstructive coronary artery disease on previous cardiac catheterization most recently in August of 2013  . Diabetes mellitus without complication (HCC)    borderline  . Fundic gland polyps of stomach, benign   . Gastritis   . GERD (gastroesophageal reflux disease)   . Hepatic steatosis   . Hyperlipidemia   . Hypertension   . Myocardial infarction ([place]) 09/2011  . Nonischemic cardiomyopathy (HCC)    Previous ejection fraction of 35-40% on echo. 50% on cardiac catheterization in August of 2013  . Obesity   . Sleep apnea    HOSPITAL COURSE:  * Chest pain: atypical.  The patient has had questionable hematemesis. Cardiac biomarkers- negative. No EKG changes to indicate ischemia.   Cardiology consult appreciated, he had catheterization done last year with some blockages up to 30% but no further workup needed at this time as per them.  * GI bleed due to PUD. Stopped Lovenox and aspirin. Hemoglobin is stable.   Started IV PPI, EGD showed gastric ulcer.  Continue PPI twice daily and to follow-up with GI as outpatient.  *  Hypertension:  continue lisinopril with hydrochlorothiazide. Labetalol as needed. *  Diabetes mellitus type 2: Hold Trulicity. Sliding cell  insulin in the hospital. * Obstructive sleep apnea: CPAP at night per home settings. * Cough: The patient has been on Levaquin for a few days. No evidence of pneumonia on chest x-ray. Oxygen saturations are normal.  DISCHARGE CONDITIONS:  Stable, discharged home. CONSULTS OBTAINED:  Treatment Team:  Minna Merritts, MD DRUG ALLERGIES:   Allergies  Allergen Reactions  . Statins Rash        DISCHARGE MEDICATIONS:   Allergies as of 06/11/2017      Reactions   Statins Rash         Medication List    STOP taking these medications   aspirin EC 81 MG tablet   ibuprofen 800 MG tablet Commonly known as:  ADVIL,MOTRIN   levofloxacin 500 MG tablet Commonly known as:  LEVAQUIN   omeprazole 40 MG capsule Commonly known as:  PRILOSEC   sulfamethoxazole-trimethoprim 800-160 MG tablet Commonly known as:  BACTRIM DS,SEPTRA DS     TAKE these medications   albuterol 108 (90 Base) MCG/ACT inhaler Commonly known as:  PROVENTIL HFA Inhale 2 puffs into the lungs every 4 (four) hours as needed for wheezing or shortness of breath.   albuterol 108 (90 Base) MCG/ACT inhaler Commonly known as:  PROVENTIL HFA;VENTOLIN HFA Inhale 2 puffs into the lungs every 6 (six) hours as needed for wheezing or shortness of breath.   benzonatate 200 MG capsule Commonly known as:  TESSALON Take 1 capsule (200 mg total) by mouth 3 (three) times daily as needed for cough.   citalopram 40 MG tablet Commonly known  as:  CELEXA TAKE 1 TABLET BY MOUTH EVERY DAY FOR GAD   guaiFENesin 600 MG 12 hr tablet Commonly known as:  MUCINEX Take 1 tablet (600 mg total) by mouth 2 (two) times daily as needed for cough or to loosen phlegm.   lisinopril-hydrochlorothiazide 20-25 MG tablet Commonly known as:  PRINZIDE,ZESTORETIC Take 1 tablet by mouth daily.   nitroGLYCERIN 0.4 MG SL tablet Commonly known as:  NITROSTAT Place 1 tablet (0.4 mg total) under the tongue every 5 (five) minutes as needed for chest  pain.   ondansetron 4 MG disintegrating tablet Commonly known as:  ZOFRAN ODT Take 1 tablet (4 mg total) by mouth every 8 (eight) hours as needed for nausea or vomiting.   pantoprazole 40 MG tablet Commonly known as:  PROTONIX Take 1 tablet (40 mg total) by mouth 2 (two) times daily before a meal.   TRULICITY 1.5 FA/2.1HY Sopn Generic drug:  Dulaglutide Inject 1.5 mg into the skin once a week.        DISCHARGE INSTRUCTIONS:  See AVS.  If you experience worsening of your admission symptoms, develop shortness of breath, life threatening emergency, suicidal or homicidal thoughts you must seek medical attention immediately by calling 911 or calling your MD immediately  if symptoms less severe.  You Must read complete instructions/literature along with all the possible adverse reactions/side effects for all the Medicines you take and that have been prescribed to you. Take any new Medicines after you have completely understood and accpet all the possible adverse reactions/side effects.   Please note  You were cared for by a hospitalist during your hospital stay. If you have any questions about your discharge medications or the care you received while you were in the hospital after you are discharged, you can call the unit and asked to speak with the hospitalist on call if the hospitalist that took care of you is not available. Once you are discharged, your primary care physician will handle any further medical issues. Please note that NO REFILLS for any discharge medications will be authorized once you are discharged, as it is imperative that you return to your primary care physician (or establish a relationship with a primary care physician if you do not have one) for your aftercare needs so that they can reassess your need for medications and monitor your lab values.    On the day of Discharge:  VITAL SIGNS:  Blood pressure 133/74, pulse 70, temperature 98.2 F (36.8 C), temperature  source Oral, resp. rate 18, height 6\' 2"  (1.88 m), weight (!) 330 lb (149.7 kg), SpO2 96 %. PHYSICAL EXAMINATION:  GENERAL:  52 y.o.-year-old patient lying in the bed with no acute distress.  EYES: Pupils equal, round, reactive to light and accommodation. No scleral icterus. Extraocular muscles intact.  HEENT: Head atraumatic, normocephalic. Oropharynx and nasopharynx clear.  NECK:  Supple, no jugular venous distention. No thyroid enlargement, no tenderness.  LUNGS: Normal breath sounds bilaterally, no wheezing, rales,rhonchi or crepitation. No use of accessory muscles of respiration.  CARDIOVASCULAR: S1, S2 normal. No murmurs, rubs, or gallops.  ABDOMEN: Soft, non-tender, non-distended. Bowel sounds present. No organomegaly or mass.  EXTREMITIES: No pedal edema, cyanosis, or clubbing.  NEUROLOGIC: Cranial nerves II through XII are intact. Muscle strength 5/5 in all extremities. Sensation intact. Gait not checked.  PSYCHIATRIC: The patient is alert and oriented x 3.  SKIN: No obvious rash, lesion, or ulcer.  DATA REVIEW:   CBC Recent Labs  Lab 06/10/17 1128  06/11/17 0808  WBC 7.5  --   --   HGB 13.1   < > 13.9  HCT 38.3*  --   --   PLT 230  --   --    < > = values in this interval not displayed.    Chemistries  Recent Labs  Lab 06/10/17 0533  NA 131*  K 4.0  CL 98*  CO2 24  GLUCOSE 359*  BUN 17  CREATININE 1.10  CALCIUM 8.9  AST 50*  ALT 67*  ALKPHOS 107  BILITOT 0.6     Microbiology Results  Results for orders placed or performed in visit on 05/26/17  CULTURE, URINE COMPREHENSIVE     Status: None   Collection Time: 05/26/17  1:17 PM  Result Value Ref Range Status   Urine Culture, Comprehensive Final report  Final   Organism ID, Bacteria Comment  Final    Comment: Mixed urogenital flora 10,000-25,000 colony forming units per mL     RADIOLOGY:  No results found.   Management plans discussed with the patient, family and they are in agreement.  CODE  STATUS: Prior   TOTAL TIME TAKING CARE OF THIS PATIENT: 30 minutes.    Demetrios Loll M.D on 06/12/2017 at 2:39 PM  Between 7am to 6pm - Pager - [phone]  After 6pm go to [email or web address] - Proofreader  [employer] [place] [facility]  Office  [phone]  CC: Primary care physician; Ronnell Freshwater, NP   Note: This dictation was prepared with Dragon dictation along with smaller phrase technology. Any transcriptional errors that result from this process are unintentional.

## 2017-06-13 ENCOUNTER — Encounter: Payer: Self-pay | Admitting: Gastroenterology

## 2017-06-14 DIAGNOSIS — J019 Acute sinusitis, unspecified: Secondary | ICD-10-CM | POA: Insufficient documentation

## 2017-06-14 DIAGNOSIS — N390 Urinary tract infection, site not specified: Secondary | ICD-10-CM | POA: Insufficient documentation

## 2017-06-15 ENCOUNTER — Telehealth: Payer: Self-pay | Admitting: Gastroenterology

## 2017-06-15 DIAGNOSIS — M79671 Pain in right foot: Secondary | ICD-10-CM | POA: Diagnosis not present

## 2017-06-15 DIAGNOSIS — M7661 Achilles tendinitis, right leg: Secondary | ICD-10-CM | POA: Diagnosis not present

## 2017-06-15 NOTE — Telephone Encounter (Signed)
Pt is  Calling for Biopsy results

## 2017-06-17 ENCOUNTER — Telehealth: Payer: Self-pay

## 2017-06-17 NOTE — Telephone Encounter (Signed)
LVM for patient callback:   - the biopsies taken during your recent endoscopic examination showed inflammation of the stomach (gastritis).

## 2017-06-22 ENCOUNTER — Ambulatory Visit: Payer: Self-pay | Admitting: Nurse Practitioner

## 2017-06-24 ENCOUNTER — Ambulatory Visit: Payer: Self-pay | Admitting: Internal Medicine

## 2017-06-24 DIAGNOSIS — G4733 Obstructive sleep apnea (adult) (pediatric): Secondary | ICD-10-CM | POA: Diagnosis not present

## 2017-07-16 ENCOUNTER — Ambulatory Visit: Payer: 59 | Admitting: Gastroenterology

## 2017-07-16 NOTE — Progress Notes (Deleted)
He is here today for a hospital follow up. I was consulted to see him on 06/10/17 when he was admitted with chest pains, nausea,vomiting and some dark colored vomitus. Hb on admission was 14.2 grams . I performed an EGD on 06/10/17 that showed multiple clean based tiny ulcer at the antrum of the stomach. Biopsies were taken and showed chronic gastritis with no H pylori. Hb at discharge was 13.9 grams.     Here to follow up to his recent hospital admission in 06/2017 when he was admitted with dark vomitus with EGD showing multiple tiny ulcers.   Plan  1.

## 2017-07-17 ENCOUNTER — Emergency Department (HOSPITAL_COMMUNITY)
Admission: EM | Admit: 2017-07-17 | Discharge: 2017-07-18 | Disposition: A | Discharge status: Home / Self Care | Payer: 59 | Attending: Emergency Medicine | Admitting: Emergency Medicine

## 2017-07-17 ENCOUNTER — Emergency Department (HOSPITAL_COMMUNITY): Payer: 59

## 2017-07-17 ENCOUNTER — Other Ambulatory Visit: Payer: Self-pay

## 2017-07-17 ENCOUNTER — Encounter (HOSPITAL_COMMUNITY): Payer: Self-pay

## 2017-07-17 ENCOUNTER — Telehealth: Payer: Self-pay

## 2017-07-17 DIAGNOSIS — R531 Weakness: Secondary | ICD-10-CM | POA: Diagnosis not present

## 2017-07-17 DIAGNOSIS — R402441 Other coma, without documented Glasgow coma scale score, or with partial score reported, in the field [EMT or ambulance]: Secondary | ICD-10-CM | POA: Diagnosis not present

## 2017-07-17 DIAGNOSIS — Z79899 Other long term (current) drug therapy: Secondary | ICD-10-CM | POA: Insufficient documentation

## 2017-07-17 DIAGNOSIS — R1110 Vomiting, unspecified: Secondary | ICD-10-CM | POA: Diagnosis not present

## 2017-07-17 DIAGNOSIS — M6281 Muscle weakness (generalized): Secondary | ICD-10-CM | POA: Diagnosis not present

## 2017-07-17 DIAGNOSIS — R05 Cough: Secondary | ICD-10-CM | POA: Insufficient documentation

## 2017-07-17 DIAGNOSIS — I252 Old myocardial infarction: Secondary | ICD-10-CM | POA: Diagnosis not present

## 2017-07-17 DIAGNOSIS — I10 Essential (primary) hypertension: Secondary | ICD-10-CM | POA: Insufficient documentation

## 2017-07-17 DIAGNOSIS — R5383 Other fatigue: Secondary | ICD-10-CM | POA: Diagnosis not present

## 2017-07-17 DIAGNOSIS — I6789 Other cerebrovascular disease: Secondary | ICD-10-CM | POA: Diagnosis not present

## 2017-07-17 DIAGNOSIS — E119 Type 2 diabetes mellitus without complications: Secondary | ICD-10-CM | POA: Diagnosis not present

## 2017-07-17 DIAGNOSIS — I2510 Atherosclerotic heart disease of native coronary artery without angina pectoris: Secondary | ICD-10-CM | POA: Diagnosis not present

## 2017-07-17 DIAGNOSIS — R0902 Hypoxemia: Secondary | ICD-10-CM | POA: Diagnosis not present

## 2017-07-17 DIAGNOSIS — R059 Cough, unspecified: Secondary | ICD-10-CM

## 2017-07-17 DIAGNOSIS — R51 Headache: Secondary | ICD-10-CM | POA: Insufficient documentation

## 2017-07-17 LAB — URINALYSIS, ROUTINE W REFLEX MICROSCOPIC
BACTERIA UA: NONE SEEN
BILIRUBIN URINE: NEGATIVE
Glucose, UA: >=500 mg/dL — AB
HGB URINE DIPSTICK: NEGATIVE
KETONES UR: NEGATIVE mg/dL
LEUKOCYTES UA: NEGATIVE
NITRITE: NEGATIVE
PH: 5 (ref 5.0–8.0)
Protein, ur: NEGATIVE mg/dL
Specific Gravity, Urine: 1.024 (ref 1.005–1.030)

## 2017-07-17 LAB — COMPREHENSIVE METABOLIC PANEL
ALK PHOS: 137 U/L — AB (ref 38–126)
ALT: 68 U/L — AB (ref 17–63)
AST: 42 U/L — AB (ref 15–41)
Albumin: 3.6 g/dL (ref 3.5–5.0)
Anion gap: 11 (ref 5–15)
BUN: 14 mg/dL (ref 6–20)
CO2: 23 mmol/L (ref 22–32)
CREATININE: 0.97 mg/dL (ref 0.61–1.24)
Calcium: 8.6 mg/dL — ABNORMAL LOW (ref 8.9–10.3)
Chloride: 102 mmol/L (ref 101–111)
GFR calc Af Amer: >60 mL/min (ref >60)
GLUCOSE: 305 mg/dL — AB (ref 65–99)
Potassium: 4.2 mmol/L (ref 3.5–5.1)
Sodium: 136 mmol/L (ref 135–145)
TOTAL PROTEIN: 6.5 g/dL (ref 6.5–8.1)
Total Bilirubin: 0.7 mg/dL (ref 0.3–1.2)

## 2017-07-17 LAB — DIFFERENTIAL
ABS IMMATURE GRANULOCYTES: 0.2 10*3/uL — AB (ref 0.0–0.1)
Basophils Absolute: 0.1 10*3/uL (ref 0.0–0.1)
Basophils Relative: 1 %
Eosinophils Absolute: 0.1 10*3/uL (ref 0.0–0.7)
Eosinophils Relative: 2 %
Immature Granulocytes: 2 %
LYMPHS PCT: 33 %
Lymphs Abs: 2.3 10*3/uL (ref 0.7–4.0)
MONOS PCT: 6 %
Monocytes Absolute: 0.4 10*3/uL (ref 0.1–1.0)
Neutro Abs: 3.9 10*3/uL (ref 1.7–7.7)
Neutrophils Relative %: 56 %

## 2017-07-17 LAB — CBC
HEMATOCRIT: 41.7 % (ref 39.0–52.0)
Hemoglobin: 13.9 g/dL (ref 13.0–17.0)
MCH: 28.9 pg (ref 26.0–34.0)
MCHC: 33.3 g/dL (ref 30.0–36.0)
MCV: 86.7 fL (ref 78.0–100.0)
PLATELETS: 241 10*3/uL (ref 150–400)
RBC: 4.81 MIL/uL (ref 4.22–5.81)
RDW: 13.1 % (ref 11.5–15.5)
WBC: 7.1 10*3/uL (ref 4.0–10.5)

## 2017-07-17 LAB — RAPID URINE DRUG SCREEN, HOSP PERFORMED
Amphetamines: NOT DETECTED
Benzodiazepines: NOT DETECTED
Cocaine: NOT DETECTED
OPIATES: NOT DETECTED
TETRAHYDROCANNABINOL: NOT DETECTED

## 2017-07-17 LAB — I-STAT CHEM 8, ED
BUN: 16 mg/dL (ref 6–20)
CALCIUM ION: 1.12 mmol/L — AB (ref 1.15–1.40)
CHLORIDE: 102 mmol/L (ref 101–111)
Creatinine, Ser: 0.8 mg/dL (ref 0.61–1.24)
GLUCOSE: 316 mg/dL — AB (ref 65–99)
HCT: 41 % (ref 39.0–52.0)
Hemoglobin: 13.9 g/dL (ref 13.0–17.0)
Potassium: 4 mmol/L (ref 3.5–5.1)
Sodium: 137 mmol/L (ref 135–145)
TCO2: 24 mmol/L (ref 22–32)

## 2017-07-17 LAB — PROTIME-INR
INR: 1.09
PROTHROMBIN TIME: 14.1 s (ref 11.4–15.2)

## 2017-07-17 LAB — APTT: aPTT: 31 seconds (ref 24–36)

## 2017-07-17 MED ORDER — LORAZEPAM 2 MG/ML IJ SOLN
1.0000 mg | INTRAMUSCULAR | Status: DC | PRN
  Administered 2017-07-17 – 2017-07-18 (×2): 1 mg via INTRAVENOUS
  Filled 2017-07-17 (×2): qty 1

## 2017-07-17 NOTE — ED Triage Notes (Signed)
Pt brought in by [facility] from home for left arm and left leg weakness x3 days, LNW >72 hours. Pt also c/o headache, recent fever, chills, and dry cough x30 days. Pt A+Ox4, c/o nausea on arrival.

## 2017-07-17 NOTE — ED Provider Notes (Signed)
[facility] EMERGENCY DEPARTMENT Provider Note   CSN: [id number] Arrival date & time: 07/17/17  1555     History   Chief Complaint Chief Complaint  Patient presents with  . Weakness  . Headache  . Cough    HPI Gerald [name] is a 52 y.o. male.  Patient brought in by EMS.  Patient came in from home had left arm and left leg weakness for the past 3 days.  Patient also complained of headache recent fever chills dry cough has been going on for a month or longer according to his wife.  Patient was alert and oriented when EMS arrived he was complaining of nausea on her on arrival.  Patient according to wife is under a lot of stress.  His wife wanted him to be seen earlier but he refused but did agree to come in today.     Past Medical History:  Diagnosis Date  . Abnormal LFTs   . Coronary artery disease    Mild nonobstructive coronary artery disease on previous cardiac catheterization most recently in August of 2013  . Diabetes mellitus without complication (HCC)    borderline  . Fundic gland polyps of stomach, benign   . Gastritis   . GERD (gastroesophageal reflux disease)   . Hepatic steatosis   . Hyperlipidemia   . Hypertension   . Myocardial infarction ([place]) 09/2011  . Nonischemic cardiomyopathy (HCC)    Previous ejection fraction of 35-40% on echo. 50% on cardiac catheterization in August of 2013  . Obesity   . Sleep apnea     Patient Active Problem List   Diagnosis Date Noted  . Urinary tract infection without hematuria 06/14/2017  . Acute non-recurrent sinusitis 06/14/2017  . Generalized anxiety disorder 02/02/2017  . Other fatigue 02/02/2017  . Abnormal results of liver function studies 02/02/2017  . Myalgia 02/02/2017  . Edema, unspecified 02/02/2017  . Peripheral vascular disease ([place]) 02/02/2017  . Circadian rhythm sleep disorder, shift work type 02/02/2017  . Acne vulgaris 02/02/2017  . Occlusion and stenosis of bilateral carotid  arteries 02/02/2017  . Abnormal weight gain 02/02/2017  . Pain in unspecified knee 02/02/2017  . Neoplasm of uncertain behavior of skin 02/02/2017  . Hypersomnia, unspecified 02/02/2017  . Shortness of breath 02/02/2017  . Morbid obesity ([place]) 07/07/2016  . Chest pain 06/08/2016  . Other chest pain 05/22/2014  . Mass of jaw 05/22/2014  . Right flank pain 11/23/2013  . Dysuria 11/23/2013  . Encounter for wellness examination 11/08/2013  . Retaining fluid 11/08/2013  . Urinary incontinence 03/14/2013  . Depression 01/13/2013  . Type 2 diabetes mellitus ([place]) 09/14/2012  . Nonischemic cardiomyopathy ([place])   . Mixed hyperlipidemia 05/28/2012  . CAD (coronary artery disease) 03/30/2012  . Chronic pain syndrome 03/30/2012  . HTN (hypertension) 03/30/2012    Past Surgical History:  Procedure Laterality Date  . CARDIAC CATHETERIZATION N/A 09/2011   [facility]; EF 50% with 30% mid LAD stenosis and no obstructive disease.  Marland Kitchen CARDIAC CATHETERIZATION  09/2010   [facility]; Mid LAD 40% stenosis; Mid Circumflex:Normal; Mid RCA; Normal  . CARDIAC CATHETERIZATION    . COLONOSCOPY    . ESOPHAGOGASTRODUODENOSCOPY (EGD) WITH PROPOFOL N/A 06/10/2017   Procedure: ESOPHAGOGASTRODUODENOSCOPY (EGD) WITH PROPOFOL;  Surgeon: Jonathon Bellows, MD;  Location: [facility] ENDOSCOPY;  Service: Gastroenterology;  Laterality: N/A;  . KNEE ARTHROSCOPY WITH MEDIAL MENISECTOMY Right 09/16/2012   Procedure: RIGHT KNEE ARTHROSCOPY WITH MEDIAL AND LATERAL MENISECTOMY, CHONDROPLASTY;  Surgeon: Ninetta Lights, MD;  Location: [facility];  Service: Orthopedics;  Laterality: Right;  RIGHT KNEE SCOPE MEDIAL MENISCECTOMY  . LEFT HEART CATH AND CORONARY ANGIOGRAPHY N/A 06/11/2016   Procedure: Left Heart Cath and Coronary Angiography;  Surgeon: Minna Merritts, MD;  Location: [facility] CV LAB;  Service: Cardiovascular;  Laterality: N/A;        Home Medications    Prior to Admission medications   Medication Sig Start Date End  Date Taking? Authorizing Provider  acetaminophen (TYLENOL) 325 MG tablet Take 325-650 mg by mouth every 6 (six) hours as needed for mild pain or headache.   Yes [provider]  albuterol (PROVENTIL HFA;VENTOLIN HFA) 108 (90 Base) MCG/ACT inhaler Inhale 2 puffs into the lungs every 6 (six) hours as needed for wheezing or shortness of breath. 02/25/17  Yes Nance Pear, MD  citalopram (CELEXA) 40 MG tablet TAKE 1 TABLET BY MOUTH EVERY DAY FOR GAD 04/22/17  Yes [provider]  Dulaglutide (TRULICITY) 1.5 MW/4.1LK SOPN Inject 1.5 mg into the skin every Wednesday.    Yes [provider]  lisinopril-hydrochlorothiazide (PRINZIDE,ZESTORETIC) 20-25 MG tablet Take 1 tablet by mouth daily. 05/25/16  Yes [provider]  pantoprazole (PROTONIX) 40 MG tablet Take 1 tablet (40 mg total) by mouth 2 (two) times daily before a meal. 06/11/17  Yes Demetrios Loll, MD  albuterol (PROVENTIL HFA) 108 (90 BASE) MCG/ACT inhaler Inhale 2 puffs into the lungs every 4 (four) hours as needed for wheezing or shortness of breath. Patient not taking: Reported on 07/17/2017 12/30/14   Carrie Mew, MD  benzonatate (TESSALON) 200 MG capsule Take 1 capsule (200 mg total) by mouth 3 (three) times daily as needed for cough. Patient not taking: Reported on 07/17/2017 06/08/17   Ronnell Freshwater, NP  guaiFENesin (MUCINEX) 600 MG 12 hr tablet Take 1 tablet (600 mg total) by mouth 2 (two) times daily as needed for cough or to loosen phlegm. Patient not taking: Reported on 07/17/2017 01/29/17   Ronnell Freshwater, NP  nitroGLYCERIN (NITROSTAT) 0.4 MG SL tablet Place 1 tablet (0.4 mg total) under the tongue every 5 (five) minutes as needed for chest pain. Patient not taking: Reported on 07/17/2017 11/08/13   Lucille Passy, MD  ondansetron (ZOFRAN ODT) 4 MG disintegrating tablet Take 1 tablet (4 mg total) by mouth every 8 (eight) hours as needed for nausea or vomiting. Patient not taking: Reported on 07/17/2017  12/01/15   Rudene Re, MD    Family History Family History  Problem Relation Age of Onset  . Heart disease Father 43       MI  . Heart attack Father   . Stomach cancer Father   . Hypertension Mother   . Breast cancer Mother     Social History Social History   Tobacco Use  . Smoking status: Never Smoker  . Smokeless tobacco: Never Used  Substance Use Topics  . Alcohol use: Yes    Alcohol/week: 0.0 oz    Comment: rare  . Drug use: No     Allergies   Statins   Review of Systems Review of Systems  Constitutional: Positive for chills, fatigue and fever.  HENT: Negative for congestion.   Respiratory: Positive for cough.   Cardiovascular: Negative for chest pain.  Gastrointestinal: Negative for abdominal pain, diarrhea and vomiting.  Genitourinary: Negative for dysuria.  Musculoskeletal: Positive for neck pain. Negative for back pain.  Neurological: Positive for weakness and headaches. Negative for dizziness, facial asymmetry and speech  difficulty.  Hematological: Does not bruise/bleed easily.  Psychiatric/Behavioral: Positive for confusion.     Physical Exam Updated Vital Signs BP 122/80   Pulse 70   Temp 99.6 F (37.6 C) (Oral)   Resp 20   Ht 1.905 m (6\' 3" )   Wt (!) 149.7 kg (330 lb)   SpO2 93%   BMI 41.25 kg/m   Physical Exam  Constitutional: He appears well-developed and well-nourished. No distress.  HENT:  Head: Normocephalic and atraumatic.  Mouth/Throat: Oropharynx is clear and moist.  Eyes: Pupils are equal, round, and reactive to light. Conjunctivae and EOM are normal.  Neck: Neck supple.  Cardiovascular: Normal rate, regular rhythm and intact distal pulses.  Pulmonary/Chest: Effort normal and breath sounds normal. No respiratory distress. He has no wheezes. He has no rales.  Abdominal: Soft. Bowel sounds are normal. There is no tenderness.  Musculoskeletal: Normal range of motion.  Neurological: He is alert. No cranial nerve deficit.  He exhibits abnormal muscle tone.  Patient with significant left upper extremity left lower extremity weakness compared to right.  No facial asymmetry no speech problems.  Patient with some drowsiness.  Skin: Skin is warm. No rash noted.  Nursing note and vitals reviewed.    ED Treatments / Results  Labs (all labs ordered are listed, but only abnormal results are displayed) Labs Reviewed  DIFFERENTIAL - Abnormal; Notable for the following components:      Result Value   Abs Immature Granulocytes 0.2 (*)    All other components within normal limits  COMPREHENSIVE METABOLIC PANEL - Abnormal; Notable for the following components:   Glucose, Bld 305 (*)    Calcium 8.6 (*)    AST 42 (*)    ALT 68 (*)    Alkaline Phosphatase 137 (*)    All other components within normal limits  RAPID URINE DRUG SCREEN, HOSP PERFORMED - Abnormal; Notable for the following components:   Barbiturates   (*)    Value: Result not available. Reagent lot number recalled by manufacturer.   All other components within normal limits  URINALYSIS, ROUTINE W REFLEX MICROSCOPIC - Abnormal; Notable for the following components:   Glucose, UA >=500 (*)    All other components within normal limits  I-STAT CHEM 8, ED - Abnormal; Notable for the following components:   Glucose, Bld 316 (*)    Calcium, Ion 1.12 (*)    All other components within normal limits  PROTIME-INR  APTT  CBC  ETHANOL  I-STAT TROPONIN, ED    EKG EKG Interpretation  Date/Time:  Friday July 17 2017 15:57:04 EDT Ventricular Rate:  89 PR Interval:    QRS Duration: 99 QT Interval:  388 QTC Calculation: 473 R Axis:   71 Text Interpretation:  Sinus rhythm Low voltage, precordial leads Anteroseptal infarct, old Confirmed by Fredia Sorrow [phone]) on 07/17/2017 4:30:30 PM   Radiology Dg Chest 2 View  Result Date: 07/17/2017 CLINICAL DATA:  Cough. EXAM: CHEST - 2 VIEW COMPARISON:  Chest x-ray dated Jun 10, 2017. FINDINGS: Stable borderline  cardiomegaly. Normal pulmonary vascularity. Low lung volumes. No focal consolidation, pleural effusion, or pneumothorax. No acute osseous abnormality. IMPRESSION: No active cardiopulmonary disease. Electronically Signed   By: Titus Dubin M.D.   On: 07/17/2017 17:35   Ct Head Wo Contrast  Result Date: 07/17/2017 CLINICAL DATA:  Headache left-sided numbness.  Neuro deficit EXAM: CT HEAD WITHOUT CONTRAST TECHNIQUE: Contiguous axial images were obtained from the base of the skull through the vertex without intravenous  contrast. COMPARISON:  CT head 10/18/2015 FINDINGS: Brain: No evidence of acute infarction, hemorrhage, hydrocephalus, extra-axial collection or mass lesion/mass effect. Vascular: Negative for hyperdense vessel Skull: Negative Sinuses/Orbits: Mild mucosal edema paranasal sinuses.  Normal orbit Other: None IMPRESSION: Negative CT head Electronically Signed   By: Franchot Gallo M.D.   On: 07/17/2017 17:33   Mr Brain Wo Contrast (neuro Protocol)  Result Date: 07/17/2017 CLINICAL DATA:  Initial evaluation for left-sided weakness for 3 days. EXAM: MRI HEAD WITHOUT CONTRAST TECHNIQUE: Multiplanar, multiecho pulse sequences of the brain and surrounding structures were obtained without intravenous contrast. COMPARISON:  Prior CT from earlier the same day. FINDINGS: Brain: Examination mildly degraded by motion. Cerebral volume normal. No focal parenchymal signal abnormality. No abnormal foci of restricted diffusion to suggest acute infarct. No areas of remote or chronic infarction. No acute or chronic intracranial hemorrhage. No mass lesion, midline shift or mass effect. No hydrocephalus. No extra-axial fluid collection. Normal pituitary gland. Vascular: Major intracranial vascular flow voids maintained. Skull and upper cervical spine: Craniocervical junction normal. Upper cervical spine within normal limits. No focal marrow replacing lesion. Scalp soft tissues unremarkable. Sinuses/Orbits: Globes and  orbital soft tissues within normal limits. Paranasal sinuses and mastoid air cells are clear. Inner ear structures grossly normal. Other: None. IMPRESSION: Normal brain MRI.  No acute intracranial abnormality. Electronically Signed   By: Jeannine Boga M.D.   On: 07/17/2017 20:22    Procedures Procedures (including critical care time)  Medications Ordered in ED Medications  LORazepam (ATIVAN) injection 1 mg (1 mg Intravenous Given 07/17/17 1856)     Initial Impression / Assessment and Plan / ED Course  I have reviewed the triage vital signs and the nursing notes.  Pertinent labs & imaging results that were available during my care of the patient were reviewed by me and considered in my medical decision making (see chart for details).    With extensive work-up for the left-sided weakness as well as the cough.  Chest x-ray negative.  Labs without significant abnormalities.  Patient's head CT negative MRI brain negative.  Consulted neuro hospitalist Dr. Leonel Ramsay.  He recommended getting MR a of head and neck as well as MRI of cervical spine.  These have been ordered.  Patient weakness may be psychosomatic.  However he did want these additional studies.  Additional studies are negative and patient still has weakness may require medical admission.  Patient's wife aware of plan.   Final Clinical Impressions(s) / ED Diagnoses   Final diagnoses:  Left-sided weakness  Cough    ED Discharge Orders    None       Fredia Sorrow, MD 07/18/17 [phone]

## 2017-07-17 NOTE — Telephone Encounter (Signed)
Per Beth patient was advised to go to the urgent care for sick appt; no opening this afternooon.tat

## 2017-07-17 NOTE — ED Notes (Signed)
Patient transported to MRI 

## 2017-07-17 NOTE — Consult Note (Signed)
Neurology Consultation Reason for Consult: Left-sided weakness Referring Physician: Gloris Manchester  CC: Left-sided weakness  History is obtained from: Patient  HPI: Gerald [name] is a 52 y.o. male with a history of abnormal gait for approximately 3 days who developed left-sided numbness today.  He states that he has been coughing for several weeks and has had severe neck pain over the past few days as well.  He states that this started after a cough.  His wife then found him difficult to arouse and brought him into the emergency department where he was evaluated with an MRI of the brain which is negative.  Neurology is been consulted for further guidance.  He states the numbness was relatively abrupt in onset, but the trouble walking was more gradual.  He denies headache other than the posterior neck pain.  When asking him and his wife about stressors, it becomes clear that he has a lot stress at this time.   ROS: A 14 point ROS was performed and is negative except as noted in the HPI.  Past Medical History:  Diagnosis Date  . Abnormal LFTs   . Coronary artery disease    Mild nonobstructive coronary artery disease on previous cardiac catheterization most recently in August of 2013  . Diabetes mellitus without complication (HCC)    borderline  . Fundic gland polyps of stomach, benign   . Gastritis   . GERD (gastroesophageal reflux disease)   . Hepatic steatosis   . Hyperlipidemia   . Hypertension   . Myocardial infarction ([place]) 09/2011  . Nonischemic cardiomyopathy (HCC)    Previous ejection fraction of 35-40% on echo. 50% on cardiac catheterization in August of 2013  . Obesity   . Sleep apnea      Family History  Problem Relation Age of Onset  . Heart disease Father 46       MI  . Heart attack Father   . Stomach cancer Father   . Hypertension Mother   . Breast cancer Mother      Social History:  reports that he has never smoked. He has never used smokeless tobacco.  He reports that he drinks alcohol. He reports that he does not use drugs.   Exam: Current vital signs: BP 122/80   Pulse 70   Temp 99.6 F (37.6 C) (Oral)   Resp 20   Ht 6\' 3"  (1.905 m)   Wt (!) 149.7 kg (330 lb)   SpO2 93%   BMI 41.25 kg/m  Vital signs in last 24 hours: Temp:  [99.6 F (37.6 C)] 99.6 F (37.6 C) (06/14 1559) Pulse Rate:  [70-90] 70 (06/14 2215) Resp:  [15-30] 20 (06/14 2215) BP: (122-185)/(72-121) 122/80 (06/14 2215) SpO2:  [90 %-100 %] 93 % (06/14 2215) Weight:  [149.7 kg (330 lb)] 149.7 kg (330 lb) (06/14 1602)   Physical Exam  Constitutional: Appears well-developed and well-nourished.  Psych: Affect appropriate to situation Eyes: No scleral injection HENT: No OP obstrucion Head: Normocephalic.  Cardiovascular: Normal rate and regular rhythm.  Respiratory: Effort normal, non-labored breathing GI: Soft.  No distension. There is no tenderness.  Skin: WDI  Neuro: Mental Status: Patient is awake, alert, oriented to person, place, month, year, and situation. Patient is able to give a clear and coherent history. No signs of aphasia or neglect Cranial Nerves: II: Visual Fields are full. Pupils are equal, round, and reactive to light.   III,IV, VI: EOMI without ptosis or diploplia.  V: Facial sensation is decreased  on the left side of his face including splitting midline to vibration on the forehead. VII: Facial movement is symmetric.  VIII: hearing is intact to voice X: Uvula elevates symmetrically XI: Shoulder shrug is symmetric. XII: tongue is midline without atrophy or fasciculations.  Motor: Tone is normal. Bulk is normal.  He has 4/5 weakness the left arm and leg with pronator drift Sensory: Sensation is diminished throughout the left side to touch and diminished in the leg to temperature Deep Tendon Reflexes: 2+ and symmetric in the biceps and possibly slightly more brisk in the left patella than the right, but difficult to tell due to  positioning .  Ankle jerks are trace bilaterally Plantars: Toes are downgoing bilaterally.  Cerebellar: FNF intact bilaterally      I have reviewed labs in epic and the results pertinent to this consultation are: CMP-elevated glucose  I have reviewed the images obtained: MRI brain-no signs of infarct  Impression: 52 year old male with neck pain and left-sided weakness and numbness.  There are some concerning findings for possible nonorganic etiology (putting the midline on his forehead to vibration).  But his motor exam his not definitive by any means.  I do think I would favor looking at his cervical spine, both for cervical stenosis as well as looking with an MRI of his neck to make sure he does not have a dissection from his golfing.  Recommendations: 1) MRI brain 2) MRA head and neck 3) if these are negative then given his findings, I think that psychogenic etiology would be the most likely diagnosis.   Roland Rack, MD [facility] [phone]  If 7pm- 7am, please page neurology on call as listed in [place].

## 2017-07-18 ENCOUNTER — Emergency Department (HOSPITAL_COMMUNITY): Payer: 59

## 2017-07-18 DIAGNOSIS — R531 Weakness: Secondary | ICD-10-CM | POA: Diagnosis not present

## 2017-07-18 DIAGNOSIS — R51 Headache: Secondary | ICD-10-CM | POA: Diagnosis not present

## 2017-07-18 LAB — ETHANOL

## 2017-07-18 MED ORDER — GADOBENATE DIMEGLUMINE 529 MG/ML IV SOLN
20.0000 mL | Freq: Once | INTRAVENOUS | Status: AC | PRN
  Administered 2017-07-18: 20 mL via INTRAVENOUS

## 2017-07-18 MED ORDER — PREDNISONE 20 MG PO TABS: ORAL_TABLET | ORAL | 0 refills | Status: DC

## 2017-07-18 NOTE — ED Provider Notes (Signed)
5:26 AM Assumed care from Dr. Rogene Houston, please see their note for full history, physical and decision making until this point. In brief this is a 52 y.o. year old male who presented to the ED tonight with Weakness; Headache; and Cough     Patient here with multiple complaints however the main one is weakness.  Neurology is seen and thinks likely psychogenic however there are possible neurologic causes so pending MRI MRA of head and neck if this is normal patient requires further evaluation.  MRIs were fine.  Patient able to ambulate with some weakness but better than on arrival.  Also with cough and diminished breath sounds concern for possible bronchitis we will treat for that as well.  No indication for antibiotics at this time.  He will follow-up with neurology for further work-up of his weakness.  Discharge instructions, including strict return precautions for new or worsening symptoms, given. Patient and/or family verbalized understanding and agreement with the plan as described.   Labs, studies and imaging reviewed by myself and considered in medical decision making if ordered. Imaging interpreted by radiology.  Labs Reviewed  DIFFERENTIAL - Abnormal; Notable for the following components:      Result Value   Abs Immature Granulocytes 0.2 (*)    All other components within normal limits  COMPREHENSIVE METABOLIC PANEL - Abnormal; Notable for the following components:   Glucose, Bld 305 (*)    Calcium 8.6 (*)    AST 42 (*)    ALT 68 (*)    Alkaline Phosphatase 137 (*)    All other components within normal limits  RAPID URINE DRUG SCREEN, HOSP PERFORMED - Abnormal; Notable for the following components:   Barbiturates   (*)    Value: Result not available. Reagent lot number recalled by manufacturer.   All other components within normal limits  URINALYSIS, ROUTINE W REFLEX MICROSCOPIC - Abnormal; Notable for the following components:   Glucose, UA >=500 (*)    All other components  within normal limits  I-STAT CHEM 8, ED - Abnormal; Notable for the following components:   Glucose, Bld 316 (*)    Calcium, Ion 1.12 (*)    All other components within normal limits  ETHANOL  PROTIME-INR  APTT  CBC  I-STAT TROPONIN, ED    MR Angiogram Neck W or Wo Contrast  Final Result    MR Cervical Spine Wo Contrast  Final Result    MR MRA Head (cerebral arteries)  Final Result    MR Brain Wo Contrast (neuro protocol)  Final Result    CT HEAD WO CONTRAST  Final Result    DG Chest 2 View  Final Result      No follow-ups on file.    Merrily Pew, MD 07/18/17 [phone]

## 2017-07-18 NOTE — ED Notes (Signed)
Patient transported to MRI 

## 2017-07-18 NOTE — ED Notes (Signed)
Pt ambulated in hallway. Pt reports no feeling in his left leg. Denies any dizziness. Slightly unstable would recommend 1 Assist.

## 2017-07-21 ENCOUNTER — Other Ambulatory Visit: Payer: Self-pay | Admitting: Nurse Practitioner

## 2017-07-21 MED ORDER — CITALOPRAM HYDROBROMIDE 40 MG PO TABS: ORAL_TABLET | ORAL | 4 refills | Status: DC

## 2017-07-22 ENCOUNTER — Other Ambulatory Visit: Payer: Self-pay

## 2017-07-22 MED ORDER — CITALOPRAM HYDROBROMIDE 40 MG PO TABS: ORAL_TABLET | ORAL | 4 refills | Status: DC

## 2017-07-24 DIAGNOSIS — G4733 Obstructive sleep apnea (adult) (pediatric): Secondary | ICD-10-CM | POA: Diagnosis not present

## 2017-07-30 ENCOUNTER — Ambulatory Visit: Payer: 59 | Admitting: Nurse Practitioner

## 2017-07-30 ENCOUNTER — Encounter: Payer: Self-pay | Admitting: Nurse Practitioner

## 2017-07-30 VITALS — BP 139/85 | HR 88 | Resp 16 | Ht 74.0 in | Wt 258.0 lb

## 2017-07-30 DIAGNOSIS — I10 Essential (primary) hypertension: Secondary | ICD-10-CM

## 2017-07-30 DIAGNOSIS — E1159 Type 2 diabetes mellitus with other circulatory complications: Secondary | ICD-10-CM | POA: Diagnosis not present

## 2017-07-30 DIAGNOSIS — K219 Gastro-esophageal reflux disease without esophagitis: Secondary | ICD-10-CM

## 2017-07-30 DIAGNOSIS — F411 Generalized anxiety disorder: Secondary | ICD-10-CM | POA: Diagnosis not present

## 2017-07-30 MED ORDER — DULAGLUTIDE 1.5 MG/0.5ML ~~LOC~~ SOAJ: 1.5000 mg | SUBCUTANEOUS | 3 refills | Status: DC

## 2017-07-30 MED ORDER — PANTOPRAZOLE SODIUM 40 MG PO TBEC: 40.0000 mg | DELAYED_RELEASE_TABLET | Freq: Two times a day (BID) | ORAL | 0 refills | Status: DC

## 2017-07-30 MED ORDER — INSULIN DETEMIR 100 UNIT/ML FLEXPEN: PEN_INJECTOR | SUBCUTANEOUS | 11 refills | Status: DC

## 2017-07-30 MED ORDER — LISINOPRIL-HYDROCHLOROTHIAZIDE 20-25 MG PO TABS: 1.0000 | ORAL_TABLET | Freq: Every day | ORAL | 3 refills | Status: DC

## 2017-07-30 MED ORDER — CITALOPRAM HYDROBROMIDE 40 MG PO TABS: ORAL_TABLET | ORAL | 4 refills | Status: DC

## 2017-07-30 NOTE — Progress Notes (Signed)
[facility] [place], [place] [address]  Internal MEDICINE  Office Visit Note  Patient Name: Gerald [name]  [id number]  [id number]  Date of Service: 08/23/2017  This patient was seen by Leretha Pol FNP Collaboration with Dr    Chief Complaint  Patient presents with  . Diabetes  . Numbness    when walking or exercising gets numbness in legs     The patient was seen in ER for headache and weakness. MRI and MRA of the head were both unremarkable. MRI of the neck was essentially normal. Did show some minimal to mild neural foraminal narrowing on C4/C5 and C6/C7 but was otherwise unremarkable. His blood sugar was over 300 and liver functions were elevated.   Diabetes  He presents for his follow-up diabetic visit. He has type 2 diabetes mellitus. No MedicAlert identification noted. His disease course has been improving. Hypoglycemia symptoms include dizziness and headaches. Pertinent negatives for hypoglycemia include no nervousness/anxiousness or tremors. Associated symptoms include blurred vision, fatigue, visual change and weakness. Pertinent negatives for diabetes include no chest pain, no polydipsia, no polyphagia and no polyuria. Hypoglycemia complications include hospitalization. Symptoms are improving. Diabetic complications include autonomic neuropathy. Risk factors for coronary artery disease include diabetes mellitus, dyslipidemia, hypertension, male sex and stress. Current diabetic treatment includes insulin injections (trulicity 1.5mg  weekly ). He is compliant with treatment most of the time. His weight is decreasing steadily. He is following a generally healthy diet. Meal planning includes avoidance of concentrated sweets. He has not had a previous visit with a dietitian. He participates in exercise daily. His home blood glucose trend is decreasing steadily. An ACE inhibitor/angiotensin II receptor blocker is being taken. He does not see a  podiatrist.   Current Medication: Outpatient Encounter Medications as of 07/30/2017  Medication Sig Note  . citalopram (CELEXA) 40 MG tablet TAKE 1 TABLET BY MOUTH EVERY DAY FOR GAD   . Dulaglutide (TRULICITY) 1.5 UM/3.5TI SOPN Inject 1.5 mg into the skin every Wednesday.   Marland Kitchen lisinopril-hydrochlorothiazide (PRINZIDE,ZESTORETIC) 20-25 MG tablet Take 1 tablet by mouth daily.   . pantoprazole (PROTONIX) 40 MG tablet Take 1 tablet (40 mg total) by mouth 2 (two) times daily before a meal.   . [DISCONTINUED] citalopram (CELEXA) 40 MG tablet TAKE 1 TABLET BY MOUTH EVERY DAY FOR GAD   . [DISCONTINUED] Dulaglutide (TRULICITY) 1.5 RW/4.3XV SOPN Inject 1.5 mg into the skin every Wednesday.  07/17/2017: Regimen confirmed to be accurate by the patient's wife  . [DISCONTINUED] lisinopril-hydrochlorothiazide (PRINZIDE,ZESTORETIC) 20-25 MG tablet Take 1 tablet by mouth daily.   . [DISCONTINUED] pantoprazole (PROTONIX) 40 MG tablet Take 1 tablet (40 mg total) by mouth 2 (two) times daily before a meal.   . [DISCONTINUED] pantoprazole (PROTONIX) 40 MG tablet Take 1 tablet (40 mg total) by mouth 2 (two) times daily before a meal.   . acetaminophen (TYLENOL) 325 MG tablet Take 325-650 mg by mouth every 6 (six) hours as needed for mild pain or headache.   . albuterol (PROVENTIL HFA;VENTOLIN HFA) 108 (90 Base) MCG/ACT inhaler Inhale 2 puffs into the lungs every 6 (six) hours as needed for wheezing or shortness of breath. (Patient not taking: Reported on 07/30/2017)   . Insulin Detemir (LEVEMIR FLEXTOUCH) 100 UNIT/ML Pen Inject 50units [place] daily.   . predniSONE (DELTASONE) 20 MG tablet 3 tabs po daily x 3 days, then 2 tabs x 3 days, then 1.5 tabs x 3 days, then 1 tab x 3 days, then 0.5 tabs  x 3 days (Patient not taking: Reported on 07/30/2017)    No facility-administered encounter medications on file as of 07/30/2017.     Surgical History: Past Surgical History:  Procedure Laterality Date  . CARDIAC CATHETERIZATION N/A  09/2011   [facility]; EF 50% with 30% mid LAD stenosis and no obstructive disease.  Marland Kitchen CARDIAC CATHETERIZATION  09/2010   [facility]; Mid LAD 40% stenosis; Mid Circumflex:Normal; Mid RCA; Normal  . CARDIAC CATHETERIZATION    . COLONOSCOPY    . ESOPHAGOGASTRODUODENOSCOPY (EGD) WITH PROPOFOL N/A 06/10/2017   Procedure: ESOPHAGOGASTRODUODENOSCOPY (EGD) WITH PROPOFOL;  Surgeon: Jonathon Bellows, MD;  Location: [facility] ENDOSCOPY;  Service: Gastroenterology;  Laterality: N/A;  . KNEE ARTHROSCOPY WITH MEDIAL MENISECTOMY Right 09/16/2012   Procedure: RIGHT KNEE ARTHROSCOPY WITH MEDIAL AND LATERAL MENISECTOMY, CHONDROPLASTY;  Surgeon: Ninetta Lights, MD;  Location: [facility];  Service: Orthopedics;  Laterality: Right;  RIGHT KNEE SCOPE MEDIAL MENISCECTOMY  . LEFT HEART CATH AND CORONARY ANGIOGRAPHY N/A 06/11/2016   Procedure: Left Heart Cath and Coronary Angiography;  Surgeon: Minna Merritts, MD;  Location: [facility];  Service: Cardiovascular;  Laterality: N/A;    Medical History: Past Medical History:  Diagnosis Date  . Abnormal LFTs   . Coronary artery disease    Mild nonobstructive coronary artery disease on previous cardiac catheterization most recently in August of 2013  . Diabetes mellitus without complication (HCC)    borderline  . Fundic gland polyps of stomach, benign   . Gastritis   . GERD (gastroesophageal reflux disease)   . Hepatic steatosis   . Hyperlipidemia   . Hypertension   . Myocardial infarction ([place]) 09/2011  . Nonischemic cardiomyopathy (HCC)    Previous ejection fraction of 35-40% on echo. 50% on cardiac catheterization in August of 2013  . Obesity   . Sleep apnea     Family History: Family History  Problem Relation Age of Onset  . Heart disease Father 8       MI  . Heart attack Father   . Stomach cancer Father   . Hypertension Mother   . Breast cancer Mother     Social History   Socioeconomic History  . Marital status: Married    Spouse name: Not on  file  . Number of children: 3  . Years of education: Not on file  . Highest education level: Not on file  Occupational History  . Occupation: Librarian, academic  Social Needs  . Financial resource strain: Not on file  . Food insecurity:    Worry: Not on file    Inability: Not on file  . Transportation needs:    Medical: Not on file    Non-medical: Not on file  Tobacco Use  . Smoking status: Never Smoker  . Smokeless tobacco: Never Used  Substance and Sexual Activity  . Alcohol use: Not Currently    Alcohol/week: 0.0 oz    Comment: rare  . Drug use: No  . Sexual activity: Not on file  Lifestyle  . Physical activity:    Days per week: Not on file    Minutes per session: Not on file  . Stress: Not on file  Relationships  . Social connections:    Talks on phone: Not on file    Gets together: Not on file    Attends religious service: Not on file    Active member of club or organization: Not on file    Attends meetings of clubs or organizations: Not on  file    Relationship status: Not on file  . Intimate partner violence:    Fear of current or ex partner: Not on file    Emotionally abused: Not on file    Physically abused: Not on file    Forced sexual activity: Not on file  Other Topics Concern  . Not on file  Social History Narrative   Married.  73 yo son.   Wife on disability for bipolar disorder.        Review of Systems  Constitutional: Positive for activity change and fatigue. Negative for chills and unexpected weight change.  HENT: Negative for congestion, postnasal drip, rhinorrhea, sinus pressure, sneezing, sore throat and voice change.   Eyes: Positive for blurred vision. Negative for redness.  Respiratory: Positive for cough. Negative for chest tightness, shortness of breath and wheezing.   Cardiovascular: Negative for chest pain and palpitations.  Gastrointestinal: Negative for abdominal pain, constipation, diarrhea, nausea and vomiting.       Abdominal  discomfort.   Endocrine: Negative for cold intolerance, heat intolerance, polydipsia, polyphagia and polyuria.       Blood sugar elevated.   Genitourinary: Positive for decreased urine volume, frequency and urgency. Negative for dysuria and testicular pain.  Musculoskeletal: Positive for arthralgias and back pain. Negative for joint swelling and neck pain.  Skin: Negative for rash.  Allergic/Immunologic: Positive for environmental allergies.  Neurological: Positive for dizziness, weakness and headaches. Negative for tremors and numbness.  Hematological: Negative for adenopathy. Does not bruise/bleed easily.  Psychiatric/Behavioral: Negative for behavioral problems (Depression), sleep disturbance and suicidal ideas. The patient is not nervous/anxious.    Today's Vitals   07/30/17 1526  BP: 139/85  Pulse: 88  Resp: 16  SpO2: 96%  Weight: 258 lb (117 kg)  Height: 6\' 2"  (1.88 m)    Physical Exam  Constitutional: He is oriented to person, place, and time. He appears well-developed and well-nourished. No distress.  HENT:  Head: Normocephalic and atraumatic.  Right Ear: Tympanic membrane is not erythematous and not bulging.  Left Ear: Tympanic membrane is not erythematous and not bulging.  Nose: Nose normal. No rhinorrhea. Right sinus exhibits no frontal sinus tenderness. Left sinus exhibits no frontal sinus tenderness.  Mouth/Throat: No oropharyngeal exudate or posterior oropharyngeal erythema.  Eyes: Pupils are equal, round, and reactive to light. Conjunctivae and EOM are normal.  Neck: Normal range of motion. Neck supple. No JVD present. No tracheal deviation present. No thyromegaly present.  Cardiovascular: Normal rate, regular rhythm and normal heart sounds. Exam reveals no gallop and no friction rub.  No murmur heard. Pulmonary/Chest: Effort normal and breath sounds normal. No respiratory distress. He has no wheezes. He has no rales. He exhibits no tenderness.  Congested,  non-productive cough present.   Abdominal: Soft. Bowel sounds are normal. There is no tenderness.  Genitourinary:  Genitourinary Comments: Tace WBC and blood in urine. Moderate protein in urine.   Musculoskeletal: Normal range of motion.  Lymphadenopathy:    He has no cervical adenopathy.  Neurological: He is alert and oriented to person, place, and time. No cranial nerve deficit.  Skin: Skin is warm and dry. Capillary refill takes 2 to 3 seconds. He is not diaphoretic.  Psychiatric: His behavior is normal. Judgment and thought content normal. His mood appears anxious.  Nursing note and vitals reviewed.  Assessment/Plan: 1. Type 2 diabetes mellitus with other circulatory complication, without long-term current use of insulin (HCC) Very elevated blood sugars. Not taking medications as prescribed. Restart  trulicity 1.5mg  weekly. Increase levemir to 50units daily. Patient to closely monitor blood sugars. Reviewed and emphasized improtnace of ADA diet and participation in routine CV exercise.  - Dulaglutide (TRULICITY) 1.5 EH/6.3JS SOPN; Inject 1.5 mg into the skin every Wednesday.  Dispense: 4 pen; Refill: 3 - Insulin Detemir (LEVEMIR FLEXTOUCH) 100 UNIT/ML Pen; Inject 50units [place] daily.  Dispense: 15 mL; Refill: 11  2. Gastroesophageal reflux disease without esophagitis Pantoprazole 40mg  daily. Limit exposure to trigger foods.  - pantoprazole (PROTONIX) 40 MG tablet; Take 1 tablet (40 mg total) by mouth 2 (two) times daily before a meal.  Dispense: 60 tablet; Refill: 0  3. Essential hypertension Increase lisinopril/HCTZ 20/25mg  daily. DASH diet reviewed and recommended.  - lisinopril-hydrochlorothiazide (PRINZIDE,ZESTORETIC) 20-25 MG tablet; Take 1 tablet by mouth daily.  Dispense: 30 tablet; Refill: 3  4. Generalized anxiety disorder celexa to  40mg  daily.  - citalopram (CELEXA) 40 MG tablet; TAKE 1 TABLET BY MOUTH EVERY DAY FOR GAD  Dispense: 90 tablet; Refill: 4  General Counseling:  Dareion verbalizes understanding of the findings of todays visit and agrees with plan of treatment. I have discussed any further diagnostic evaluation that may be needed or ordered today. We also reviewed his medications today. he has been encouraged to call the office with any questions or concerns that should arise related to todays visit.    Counseling:  Counseling: Adherence of Medical Therapy: The patient understands that it is the responsibility of the patient to complete all prescribed medications, all recommended testing, including but not limited to, laboratory studies and imaging. The patient further understands the need to keep all scheduled follow-up visits and to inform the office immediately of any changes in their medical condition. The patient understands that the success of treatment in large part depends on the patient's willingness to complete the therapeutic regimen and to work in partnership with the designated health-care providers.  This patient was seen by Leretha Pol FNP Collaboration with Dr Lavera Guise as a part of collaborative care agreement   I have reviewed all medical records from hospital follow up including radiology reports and consults from other physicians. Appropriate follow up diagnostics will be scheduled as needed. Patient/ Family understands the plan of treatment. Time spent 25 minutes.   Dr Lavera Guise, MD Internal Medicine

## 2017-08-23 DIAGNOSIS — K219 Gastro-esophageal reflux disease without esophagitis: Secondary | ICD-10-CM | POA: Insufficient documentation

## 2017-08-24 DIAGNOSIS — G4733 Obstructive sleep apnea (adult) (pediatric): Secondary | ICD-10-CM | POA: Diagnosis not present

## 2017-08-31 ENCOUNTER — Other Ambulatory Visit: Payer: Self-pay

## 2017-08-31 DIAGNOSIS — K219 Gastro-esophageal reflux disease without esophagitis: Secondary | ICD-10-CM

## 2017-08-31 MED ORDER — PANTOPRAZOLE SODIUM 40 MG PO TBEC: 40.0000 mg | DELAYED_RELEASE_TABLET | Freq: Two times a day (BID) | ORAL | 0 refills | Status: DC

## 2017-09-15 IMAGING — DX DG CHEST 1V PORT
1 series · 1 of 1 positions shown · non-contrast
Comparison: 12/30/2014

CLINICAL DATA: Intermittently chest pain midsternal region starting
[REDACTED], cough

EXAM:
PORTABLE CHEST 1 VIEW

[chest ap]
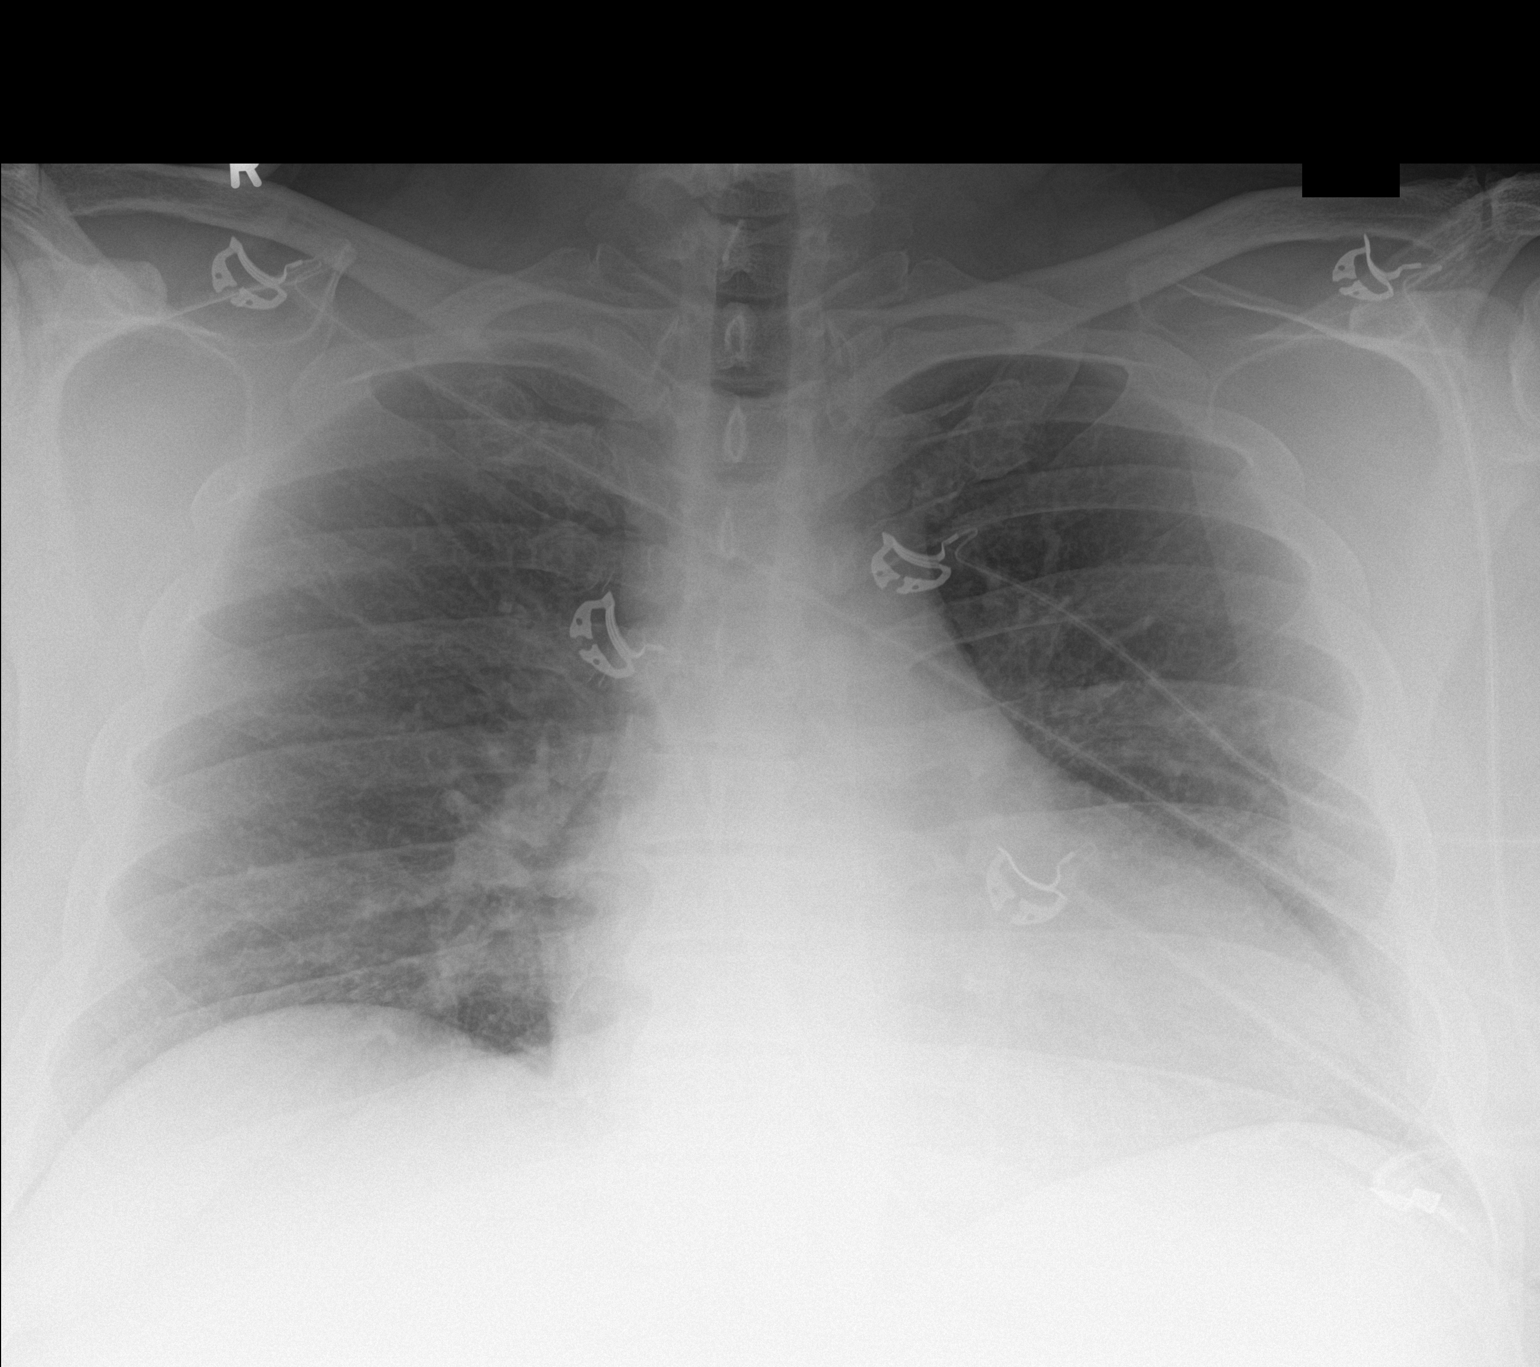

[1 of 1 positions shown; findings below may reference images not displayed]

FINDINGS: Cardiomediastinal silhouette is stable. No acute infiltrate or
pleural effusion. No pulmonary edema. Bony thorax is unremarkable.
IMPRESSION: No active disease.

## 2017-09-22 ENCOUNTER — Ambulatory Visit: Payer: 59 | Admitting: Adult Health

## 2017-09-22 ENCOUNTER — Encounter: Payer: Self-pay | Admitting: Adult Health

## 2017-09-22 VITALS — BP 160/90 | HR 73 | Temp 98.5°F | Resp 16 | Ht 74.0 in | Wt 334.8 lb

## 2017-09-22 DIAGNOSIS — H60311 Diffuse otitis externa, right ear: Secondary | ICD-10-CM | POA: Diagnosis not present

## 2017-09-22 DIAGNOSIS — E1165 Type 2 diabetes mellitus with hyperglycemia: Secondary | ICD-10-CM

## 2017-09-22 DIAGNOSIS — H6121 Impacted cerumen, right ear: Secondary | ICD-10-CM

## 2017-09-22 DIAGNOSIS — I10 Essential (primary) hypertension: Secondary | ICD-10-CM

## 2017-09-22 MED ORDER — CARBAMIDE PEROXIDE 6.5 % OT SOLN: 5.0000 [drp] | Freq: Two times a day (BID) | OTIC | 0 refills | Status: DC

## 2017-09-22 MED ORDER — OFLOXACIN 0.3 % OT SOLN: 5.0000 [drp] | Freq: Every day | OTIC | 0 refills | Status: DC

## 2017-09-22 NOTE — Progress Notes (Signed)
[facility] [place],  [address]  Internal MEDICINE  Office Visit Note  Patient Name: Gerald [name]  [id number]  [id number]  Date of Service: 10/08/2017  Chief Complaint  Patient presents with  . Ear Pain    ear clogged up , swimmers ear right can not seem to unclog   . Quality Metric Gaps    foot exam   HPI Pt is here for a sick visit.  He reports he went swimming in a pool and he has been unable to get the water out of his right ear.  He reports difficulty hearing, and a clogged feeling in his ear.  He denies pain, or fever.  He has been using swimmers ear drops with no relief.  Pt has diabetes and will need diabetic eye exam  Current Medication:  Outpatient Encounter Medications as of 09/22/2017  Medication Sig  . acetaminophen (TYLENOL) 325 MG tablet Take 325-650 mg by mouth every 6 (six) hours as needed for mild pain or headache.  . citalopram (CELEXA) 40 MG tablet TAKE 1 TABLET BY MOUTH EVERY DAY FOR GAD  . Dulaglutide (TRULICITY) 1.5 RD/4.0CX SOPN Inject 1.5 mg into the skin every Wednesday.  . Insulin Detemir (LEVEMIR FLEXTOUCH) 100 UNIT/ML Pen Inject 50units [place] daily.  Marland Kitchen lisinopril-hydrochlorothiazide (PRINZIDE,ZESTORETIC) 20-25 MG tablet Take 1 tablet by mouth daily.  . [DISCONTINUED] pantoprazole (PROTONIX) 40 MG tablet Take 1 tablet (40 mg total) by mouth 2 (two) times daily before a meal.  . albuterol (PROVENTIL HFA;VENTOLIN HFA) 108 (90 Base) MCG/ACT inhaler Inhale 2 puffs into the lungs every 6 (six) hours as needed for wheezing or shortness of breath.  . carbamide peroxide (DEBROX) 6.5 % OTIC solution Place 5 drops into the right ear 2 (two) times daily.  Marland Kitchen ofloxacin (FLOXIN OTIC) 0.3 % OTIC solution Place 5 drops into the right ear daily.  . predniSONE (DELTASONE) 20 MG tablet 3 tabs po daily x 3 days, then 2 tabs x 3 days, then 1.5 tabs x 3 days, then 1 tab x 3 days, then 0.5 tabs x 3 days   No facility-administered encounter  medications on file as of 09/22/2017.       Medical History: Past Medical History:  Diagnosis Date  . Abnormal LFTs   . Coronary artery disease    Mild nonobstructive coronary artery disease on previous cardiac catheterization most recently in August of 2013  . Diabetes mellitus without complication (HCC)    borderline  . Fundic gland polyps of stomach, benign   . Gastritis   . GERD (gastroesophageal reflux disease)   . Hepatic steatosis   . Hyperlipidemia   . Hypertension   . Myocardial infarction ([place]) 09/2011  . Nonischemic cardiomyopathy (HCC)    Previous ejection fraction of 35-40% on echo. 50% on cardiac catheterization in August of 2013  . Obesity   . Sleep apnea      Vital Signs: BP (!) 160/90   Pulse 73   Temp 98.5 F (36.9 C)   Resp 16   Ht 6\' 2"  (1.88 m)   Wt (!) 334 lb 12.8 oz (151.9 kg)   SpO2 96%   BMI 42.99 kg/m    Review of Systems  Constitutional: Negative.  Negative for chills, fatigue and unexpected weight change.  HENT: Positive for hearing loss and tinnitus. Negative for congestion, rhinorrhea, sneezing and sore throat.        Right ear pressure, fullness.   Eyes: Negative for redness.  Respiratory:  Negative.  Negative for cough, chest tightness and shortness of breath.   Cardiovascular: Negative.  Negative for chest pain and palpitations.  Gastrointestinal: Negative.  Negative for abdominal pain, constipation, diarrhea, nausea and vomiting.  Endocrine: Negative.   Genitourinary: Negative.  Negative for dysuria and frequency.  Musculoskeletal: Negative.  Negative for arthralgias, back pain, joint swelling and neck pain.  Skin: Negative.  Negative for rash.  Allergic/Immunologic: Negative.   Neurological: Negative.  Negative for tremors and numbness.  Hematological: Negative for adenopathy. Does not bruise/bleed easily.  Psychiatric/Behavioral: Negative.  Negative for behavioral problems, sleep disturbance and suicidal ideas. The patient is not  nervous/anxious.     Physical Exam  Constitutional: He is oriented to person, place, and time. He appears well-developed and well-nourished. No distress.  HENT:  Head: Normocephalic and atraumatic.  Mouth/Throat: Oropharynx is clear and moist. No oropharyngeal exudate.  Eyes: Pupils are equal, round, and reactive to light. EOM are normal.  Neck: Normal range of motion. Neck supple. No JVD present. No tracheal deviation present. No thyromegaly present.  Cardiovascular: Normal rate, regular rhythm and normal heart sounds. Exam reveals no gallop and no friction rub.  No murmur heard. Pulmonary/Chest: Effort normal and breath sounds normal. No respiratory distress. He has no wheezes. He has no rales. He exhibits no tenderness.  Abdominal: Soft. There is no tenderness. There is no guarding.  Musculoskeletal: Normal range of motion.  Lymphadenopathy:    He has no cervical adenopathy.  Neurological: He is alert and oriented to person, place, and time. No cranial nerve deficit.  Skin: Skin is warm and dry. He is not diaphoretic.  Psychiatric: He has a normal mood and affect. His behavior is normal. Judgment and thought content normal.  Nursing note and vitals reviewed.  Assessment/Plan:  1. Acute diffuse otitis externa of right ear Use drops as directed.  Return to clinic if no better in 4-6 days.  - carbamide peroxide (DEBROX) 6.5 % OTIC solution; Place 5 drops into the right ear 2 (two) times daily.  Dispense: 15 mL; Refill: 0 - ofloxacin (FLOXIN OTIC) 0.3 % OTIC solution; Place 5 drops into the right ear daily.  Dispense: 5 mL; Refill: 0 - Might need oral therapy  2. Uncontrolled type 2 diabetes mellitus with hyperglycemia (HCC) Pt has elevated sugars in the past, can cause pseudomonas infection, might need CX. Needs diabetic foot exam   3. Benign hypertension BP is elevated, will monitor  4. Hearing loss of right ear due to cerumen impaction -Will need ear irrigation  General  Counseling: [name] understanding of the findings of todays visit and agrees with plan of treatment. I have discussed any further diagnostic evaluation that may be needed or ordered today. We also reviewed his medications today. he has been encouraged to call the office with any questions or concerns that should arise related to todays visit.   Meds ordered this encounter  Medications  . carbamide peroxide (DEBROX) 6.5 % OTIC solution    Sig: Place 5 drops into the right ear 2 (two) times daily.    Dispense:  15 mL    Refill:  0    Order Specific Question:   Supervising Provider    Answer:   Lavera Guise [[id number]]  . ofloxacin (FLOXIN OTIC) 0.3 % OTIC solution    Sig: Place 5 drops into the right ear daily.    Dispense:  5 mL    Refill:  0    Order Specific Question:  Supervising Provider    Answer:   Lavera Guise [[id number]]    Time spent: 25 Minutes  This patient was seen by Orson Gear AGNP-C in Collaboration with Dr Lavera Guise as a part of collaborative care agreement

## 2017-09-22 NOTE — Patient Instructions (Signed)

## 2017-09-24 DIAGNOSIS — G4733 Obstructive sleep apnea (adult) (pediatric): Secondary | ICD-10-CM | POA: Diagnosis not present

## 2017-09-28 ENCOUNTER — Encounter: Payer: Self-pay | Admitting: Adult Health

## 2017-09-28 ENCOUNTER — Ambulatory Visit: Payer: 59 | Admitting: Adult Health

## 2017-09-28 VITALS — BP 130/80 | HR 72 | Temp 98.1°F | Resp 16 | Ht 74.0 in | Wt 337.0 lb

## 2017-09-28 DIAGNOSIS — H6121 Impacted cerumen, right ear: Secondary | ICD-10-CM

## 2017-09-28 DIAGNOSIS — H60501 Unspecified acute noninfective otitis externa, right ear: Secondary | ICD-10-CM | POA: Diagnosis not present

## 2017-09-28 MED ORDER — AMOXICILLIN-POT CLAVULANATE 875-125 MG PO TABS: 1.0000 | ORAL_TABLET | Freq: Two times a day (BID) | ORAL | 0 refills | Status: DC

## 2017-09-28 NOTE — Patient Instructions (Signed)
Earwax Buildup, Adult The ears produce a substance called earwax that helps keep bacteria out of the ear and protects the skin in the ear canal. Occasionally, earwax can build up in the ear and cause discomfort or hearing loss. What increases the risk? This condition is more likely to develop in people who:  Are male.  Are elderly.  Naturally produce more earwax.  Clean their ears often with cotton swabs.  Use earplugs often.  Use in-ear headphones often.  Wear hearing aids.  Have narrow ear canals.  Have earwax that is overly thick or sticky.  Have eczema.  Are dehydrated.  Have excess hair in the ear canal.  What are the signs or symptoms? Symptoms of this condition include:  Reduced or muffled hearing.  A feeling of fullness in the ear or feeling that the ear is plugged.  Fluid coming from the ear.  Ear pain.  Ear itch.  Ringing in the ear.  Coughing.  An obvious piece of earwax that can be seen inside the ear canal.  How is this diagnosed? This condition may be diagnosed based on:  Your symptoms.  Your medical history.  An ear exam. During the exam, your health care provider will look into your ear with an instrument called an otoscope.  You may have tests, including a hearing test. How is this treated? This condition may be treated by:  Using ear drops to soften the earwax.  Having the earwax removed by a health care provider. The health care provider may: ? Flush the ear with water. ? Use an instrument that has a loop on the end (curette). ? Use a suction device.  Surgery to remove the wax buildup. This may be done in severe cases.  Follow these instructions at home:  Take over-the-counter and prescription medicines only as told by your health care provider.  Do not put any objects, including cotton swabs, into your ear. You can clean the opening of your ear canal with a washcloth or facial tissue.  Follow instructions from your health  care provider about cleaning your ears. Do not over-clean your ears.  Drink enough fluid to keep your urine clear or pale yellow. This will help to thin the earwax.  Keep all follow-up visits as told by your health care provider. If earwax builds up in your ears often or if you use hearing aids, consider seeing your health care provider for routine, preventive ear cleanings. Ask your health care provider how often you should schedule your cleanings.  If you have hearing aids, clean them according to instructions from the manufacturer and your health care provider. Contact a health care provider if:  You have ear pain.  You develop a fever.  You have blood, pus, or other fluid coming from your ear.  You have hearing loss.  You have ringing in your ears that does not go away.  Your symptoms do not improve with treatment.  You feel like the room is spinning (vertigo). Summary  Earwax can build up in the ear and cause discomfort or hearing loss.  The most common symptoms of this condition include reduced or muffled hearing and a feeling of fullness in the ear or feeling that the ear is plugged.  This condition may be diagnosed based on your symptoms, your medical history, and an ear exam.  This condition may be treated by using ear drops to soften the earwax or by having the earwax removed by a health care provider.  Do   not put any objects, including cotton swabs, into your ear. You can clean the opening of your ear canal with a washcloth or facial tissue. This information is not intended to replace advice given to you by your health care provider. Make sure you discuss any questions you have with your health care provider. Document Released: 02/28/2004 Document Revised: 04/02/2016 Document Reviewed: 04/02/2016 Elsevier Interactive Patient Education  2018 [employer]  

## 2017-09-28 NOTE — Progress Notes (Signed)
[facility] [place], [place] [address]  Internal MEDICINE  Office Visit Note  Patient Name: Gerald [name]  [id number]  [id number]  Date of Service: 10/11/2017  Chief Complaint  Patient presents with  . Ear Pain    right    HPI Pt is here for a sick visit.  He continues to report right ear pain.  He has been using debrox drops and antibiotic drops with no relief.  He reports balance issues, and continued pain.   Current Medication:  Outpatient Encounter Medications as of 09/28/2017  Medication Sig  . acetaminophen (TYLENOL) 325 MG tablet Take 325-650 mg by mouth every 6 (six) hours as needed for mild pain or headache.  . albuterol (PROVENTIL HFA;VENTOLIN HFA) 108 (90 Base) MCG/ACT inhaler Inhale 2 puffs into the lungs every 6 (six) hours as needed for wheezing or shortness of breath.  . carbamide peroxide (DEBROX) 6.5 % OTIC solution Place 5 drops into the right ear 2 (two) times daily.  . citalopram (CELEXA) 40 MG tablet TAKE 1 TABLET BY MOUTH EVERY DAY FOR GAD  . Dulaglutide (TRULICITY) 1.5 BM/8.4XL SOPN Inject 1.5 mg into the skin every Wednesday.  . Insulin Detemir (LEVEMIR FLEXTOUCH) 100 UNIT/ML Pen Inject 50units [place] daily.  Marland Kitchen lisinopril-hydrochlorothiazide (PRINZIDE,ZESTORETIC) 20-25 MG tablet Take 1 tablet by mouth daily.  Marland Kitchen ofloxacin (FLOXIN OTIC) 0.3 % OTIC solution Place 5 drops into the right ear daily.  . predniSONE (DELTASONE) 20 MG tablet 3 tabs po daily x 3 days, then 2 tabs x 3 days, then 1.5 tabs x 3 days, then 1 tab x 3 days, then 0.5 tabs x 3 days  . [DISCONTINUED] pantoprazole (PROTONIX) 40 MG tablet Take 1 tablet (40 mg total) by mouth 2 (two) times daily before a meal.  . amoxicillin-clavulanate (AUGMENTIN) 875-125 MG tablet Take 1 tablet by mouth 2 (two) times daily.   No facility-administered encounter medications on file as of 09/28/2017.       Medical History: Past Medical History:  Diagnosis Date  . Abnormal LFTs   .  Coronary artery disease    Mild nonobstructive coronary artery disease on previous cardiac catheterization most recently in August of 2013  . Diabetes mellitus without complication (HCC)    borderline  . Fundic gland polyps of stomach, benign   . Gastritis   . GERD (gastroesophageal reflux disease)   . Hepatic steatosis   . Hyperlipidemia   . Hypertension   . Myocardial infarction ([place]) 09/2011  . Nonischemic cardiomyopathy (HCC)    Previous ejection fraction of 35-40% on echo. 50% on cardiac catheterization in August of 2013  . Obesity   . Sleep apnea      Vital Signs: BP 130/80   Pulse 72   Temp 98.1 F (36.7 C)   Resp 16   Ht 6\' 2"  (1.88 m)   Wt (!) 337 lb (152.9 kg)   SpO2 97%   BMI 43.27 kg/m    Review of Systems  Constitutional: Negative.  Negative for chills, fatigue and unexpected weight change.  HENT: Positive for ear pain. Negative for congestion, rhinorrhea, sneezing and sore throat.   Eyes: Negative for redness.  Respiratory: Negative.  Negative for cough, chest tightness and shortness of breath.   Cardiovascular: Negative.  Negative for chest pain and palpitations.  Gastrointestinal: Negative.  Negative for abdominal pain, constipation, diarrhea, nausea and vomiting.  Endocrine: Negative.   Genitourinary: Negative.  Negative for dysuria and frequency.  Musculoskeletal: Negative.  Negative  for arthralgias, back pain, joint swelling and neck pain.  Skin: Negative.  Negative for rash.  Allergic/Immunologic: Negative.   Neurological: Negative.  Negative for tremors and numbness.  Hematological: Negative for adenopathy. Does not bruise/bleed easily.  Psychiatric/Behavioral: Negative.  Negative for behavioral problems, sleep disturbance and suicidal ideas. The patient is not nervous/anxious.     Physical Exam  Constitutional: He is oriented to person, place, and time. He appears well-developed and well-nourished. No distress.  HENT:  Head: Normocephalic and  atraumatic.  Right Ear: There is tenderness. Decreased hearing is noted.  Left Ear: Hearing, tympanic membrane, external ear and ear canal normal.  Mouth/Throat: Oropharynx is clear and moist. No oropharyngeal exudate.  Ear Canal before irrigation is blocked by Cerumen.  Post irrigation the canal is clear, TM visible, no bulging, clear and grey in appearance.   Eyes: Pupils are equal, round, and reactive to light. EOM are normal.  Neck: Normal range of motion. Neck supple. No JVD present. No tracheal deviation present. No thyromegaly present.  Cardiovascular: Normal rate, regular rhythm and normal heart sounds. Exam reveals no gallop and no friction rub.  No murmur heard. Pulmonary/Chest: Effort normal and breath sounds normal. No respiratory distress. He has no wheezes. He has no rales. He exhibits no tenderness.  Abdominal: Soft. There is no tenderness. There is no guarding.  Musculoskeletal: Normal range of motion.  Lymphadenopathy:    He has no cervical adenopathy.  Neurological: He is alert and oriented to person, place, and time. No cranial nerve deficit.  Skin: Skin is warm and dry. He is not diaphoretic.  Psychiatric: He has a normal mood and affect. His behavior is normal. Judgment and thought content normal.  Nursing note and vitals reviewed.   Assessment/Plan: 1. Impacted cerumen of right ear Irrigated in clinic.  Canal clear.  Pt reports mild pain of inner ear, TM appears normal.  Watch and wait Augmentin RX sent because patient is going out of town.  - amoxicillin-clavulanate (AUGMENTIN) 875-125 MG tablet; Take 1 tablet by mouth 2 (two) times daily.  Dispense: 20 tablet; Refill: 0  2. Acute otitis externa of right ear, unspecified type Canal appears mildly inflamed beyond impaction.  Continue ofloxacin as directed.   General Counseling: delman goshorn understanding of the findings of todays visit and agrees with plan of treatment. I have discussed any further diagnostic  evaluation that may be needed or ordered today. We also reviewed his medications today. he has been encouraged to call the office with any questions or concerns that should arise related to todays visit.   Orders Placed This Encounter  Procedures  . Ear Lavage    Meds ordered this encounter  Medications  . amoxicillin-clavulanate (AUGMENTIN) 875-125 MG tablet    Sig: Take 1 tablet by mouth 2 (two) times daily.    Dispense:  20 tablet    Refill:  0    Time spent: 30 Minutes  This patient was seen by Orson Gear AGNP-C in Collaboration with Dr Lavera Guise as a part of collaborative care agreement

## 2017-09-30 ENCOUNTER — Other Ambulatory Visit: Payer: Self-pay

## 2017-09-30 DIAGNOSIS — K219 Gastro-esophageal reflux disease without esophagitis: Secondary | ICD-10-CM

## 2017-09-30 MED ORDER — PANTOPRAZOLE SODIUM 40 MG PO TBEC
40.0000 mg | DELAYED_RELEASE_TABLET | Freq: Two times a day (BID) | ORAL | 5 refills | Status: DC
Start: 2017-09-30 — End: 2018-01-09

## 2017-10-12 ENCOUNTER — Ambulatory Visit: Payer: 59 | Admitting: Adult Health

## 2017-10-12 ENCOUNTER — Encounter: Payer: Self-pay | Admitting: Adult Health

## 2017-10-12 VITALS — BP 138/82 | HR 68 | Resp 16 | Ht 74.0 in | Wt 335.0 lb

## 2017-10-12 DIAGNOSIS — I10 Essential (primary) hypertension: Secondary | ICD-10-CM | POA: Diagnosis not present

## 2017-10-12 DIAGNOSIS — E782 Mixed hyperlipidemia: Secondary | ICD-10-CM | POA: Diagnosis not present

## 2017-10-12 DIAGNOSIS — E1165 Type 2 diabetes mellitus with hyperglycemia: Secondary | ICD-10-CM

## 2017-10-12 LAB — POCT GLYCOSYLATED HEMOGLOBIN (HGB A1C): Hemoglobin A1C: 8.2 % — AB (ref 4.0–5.6)

## 2017-10-12 NOTE — Progress Notes (Signed)
[facility] [place], [place] [address]  Internal MEDICINE  Office Visit Note  Patient Name: Gerald [name]  [id number]  [id number]  Date of Service: 10/20/2017  Chief Complaint  Patient presents with  . Diabetes  . Hypertension  . Hyperlipidemia   HPI Pt here for follow up on Diabetes, HTN and HLD.  His A1C is down to 8.2 at this visit.  It was 11.0  4 months ago.  He reports he is taking Levemir 50 units daily and Trulicity 1.5mg  a week. He is checking his blood sugar in the am and is getting around 160.  With a high of 300 and a low of 62.  He also checks it at lunch time and he's typically 175-180 at that time.  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    Current Medication: Outpatient Encounter Medications as of 10/12/2017  Medication Sig  . acetaminophen (TYLENOL) 325 MG tablet Take 325-650 mg by mouth every 6 (six) hours as needed for mild pain or headache.  . albuterol (PROVENTIL HFA;VENTOLIN HFA) 108 (90 Base) MCG/ACT inhaler Inhale 2 puffs into the lungs every 6 (six) hours as needed for wheezing or shortness of breath.  . citalopram (CELEXA) 40 MG tablet TAKE 1 TABLET BY MOUTH EVERY DAY FOR GAD  . Dulaglutide (TRULICITY) 1.5 RF/1.6BW SOPN Inject 1.5 mg into the skin every Wednesday.  . Insulin Detemir (LEVEMIR FLEXTOUCH) 100 UNIT/ML Pen Inject 50units [place] daily.  Marland Kitchen lisinopril-hydrochlorothiazide (PRINZIDE,ZESTORETIC) 20-25 MG tablet Take 1 tablet by mouth daily.  . pantoprazole (PROTONIX) 40 MG tablet Take 1 tablet (40 mg total) by mouth 2 (two) times daily before a meal.  . [DISCONTINUED] amoxicillin-clavulanate (AUGMENTIN) 875-125 MG tablet Take 1 tablet by mouth 2 (two) times daily. (Patient not taking: Reported on 10/12/2017)  . [DISCONTINUED] carbamide peroxide (DEBROX) 6.5 % OTIC solution Place 5 drops into the right ear 2 (two) times daily. (Patient not taking: Reported on 10/12/2017)  . [DISCONTINUED] ofloxacin (FLOXIN OTIC) 0.3 % OTIC  solution Place 5 drops into the right ear daily. (Patient not taking: Reported on 10/12/2017)  . [DISCONTINUED] predniSONE (DELTASONE) 20 MG tablet 3 tabs po daily x 3 days, then 2 tabs x 3 days, then 1.5 tabs x 3 days, then 1 tab x 3 days, then 0.5 tabs x 3 days (Patient not taking: Reported on 10/12/2017)   No facility-administered encounter medications on file as of 10/12/2017.    Surgical History: Past Surgical History:  Procedure Laterality Date  . CARDIAC CATHETERIZATION N/A 09/2011   [facility]; EF 50% with 30% mid LAD stenosis and no obstructive disease.  Marland Kitchen CARDIAC CATHETERIZATION  09/2010   [facility]; Mid LAD 40% stenosis; Mid Circumflex:Normal; Mid RCA; Normal  . CARDIAC CATHETERIZATION    . COLONOSCOPY    . ESOPHAGOGASTRODUODENOSCOPY (EGD) WITH PROPOFOL N/A 06/10/2017   Procedure: ESOPHAGOGASTRODUODENOSCOPY (EGD) WITH PROPOFOL;  Surgeon: Jonathon Bellows, MD;  Location: [facility] ENDOSCOPY;  Service: Gastroenterology;  Laterality: N/A;  . KNEE ARTHROSCOPY WITH MEDIAL MENISECTOMY Right 09/16/2012   Procedure: RIGHT KNEE ARTHROSCOPY WITH MEDIAL AND LATERAL MENISECTOMY, CHONDROPLASTY;  Surgeon: Ninetta Lights, MD;  Location: [facility];  Service: Orthopedics;  Laterality: Right;  RIGHT KNEE SCOPE MEDIAL MENISCECTOMY  . LEFT HEART CATH AND CORONARY ANGIOGRAPHY N/A 06/11/2016   Procedure: Left Heart Cath and Coronary Angiography;  Surgeon: Minna Merritts, MD;  Location: [facility];  Service: Cardiovascular;  Laterality: N/A;   Medical History: Past Medical  History:  Diagnosis Date  . Abnormal LFTs   . Coronary artery disease    Mild nonobstructive coronary artery disease on previous cardiac catheterization most recently in August of 2013  . Diabetes mellitus without complication (HCC)    borderline  . Fundic gland polyps of stomach, benign   . Gastritis   . GERD (gastroesophageal reflux disease)   . Hepatic steatosis   . Hyperlipidemia   . Hypertension   . Myocardial infarction  ([place]) 09/2011  . Nonischemic cardiomyopathy (HCC)    Previous ejection fraction of 35-40% on echo. 50% on cardiac catheterization in August of 2013  . Obesity   . Sleep apnea    Family History: Family History  Problem Relation Age of Onset  . Heart disease Father 58       MI  . Heart attack Father   . Stomach cancer Father   . Hypertension Mother   . Breast cancer Mother    Social History   Socioeconomic History  . Marital status: Married    Spouse name: Not on file  . Number of children: 3  . Years of education: Not on file  . Highest education level: Not on file  Occupational History  . Occupation: Librarian, academic  Social Needs  . Financial resource strain: Not on file  . Food insecurity:    Worry: Not on file    Inability: Not on file  . Transportation needs:    Medical: Not on file    Non-medical: Not on file  Tobacco Use  . Smoking status: Never Smoker  . Smokeless tobacco: Never Used  Substance and Sexual Activity  . Alcohol use: Not Currently    Alcohol/week: 0.0 standard drinks    Comment: rare  . Drug use: No  . Sexual activity: Not on file  Lifestyle  . Physical activity:    Days per week: Not on file    Minutes per session: Not on file  . Stress: Not on file  Relationships  . Social connections:    Talks on phone: Not on file    Gets together: Not on file    Attends religious service: Not on file    Active member of club or organization: Not on file    Attends meetings of clubs or organizations: Not on file    Relationship status: Not on file  . Intimate partner violence:    Fear of current or ex partner: Not on file    Emotionally abused: Not on file    Physically abused: Not on file    Forced sexual activity: Not on file  Other Topics Concern  . Not on file  Social History Narrative   Married.  17 yo son.   Wife on disability for bipolar disorder.     Review of Systems  Constitutional: Negative.  Negative for chills, fatigue and  unexpected weight change.  HENT: Negative.  Negative for congestion, rhinorrhea, sneezing and sore throat.   Eyes: Negative for redness.  Respiratory: Negative.  Negative for cough, chest tightness and shortness of breath.   Cardiovascular: Negative.  Negative for chest pain and palpitations.  Gastrointestinal: Negative.  Negative for abdominal pain, constipation, diarrhea, nausea and vomiting.  Endocrine: Negative.   Genitourinary: Negative.  Negative for dysuria and frequency.  Musculoskeletal: Negative.  Negative for arthralgias, back pain, joint swelling and neck pain.  Skin: Negative.  Negative for rash.  Allergic/Immunologic: Negative.   Neurological: Negative.  Negative for tremors and numbness.  Hematological: Negative  for adenopathy. Does not bruise/bleed easily.  Psychiatric/Behavioral: Negative.  Negative for behavioral problems, sleep disturbance and suicidal ideas. The patient is not nervous/anxious.    Vital Signs: BP 138/82   Pulse 68   Resp 16   Ht 6\' 2"  (1.88 m)   Wt (!) 335 lb (152 kg)   SpO2 97%   BMI 43.01 kg/m   Physical Exam  Constitutional: He is oriented to person, place, and time. He appears well-developed and well-nourished. No distress.  HENT:  Head: Normocephalic and atraumatic.  Mouth/Throat: Oropharynx is clear and moist. No oropharyngeal exudate.  Eyes: Pupils are equal, round, and reactive to light. EOM are normal.  Neck: Normal range of motion. Neck supple. No JVD present. No tracheal deviation present. No thyromegaly present.  Cardiovascular: Normal rate, regular rhythm and normal heart sounds. Exam reveals no gallop and no friction rub.  No murmur heard. Pulmonary/Chest: Effort normal and breath sounds normal. No respiratory distress. He has no wheezes. He has no rales. He exhibits no tenderness.  Abdominal: Soft. There is no tenderness. There is no guarding.  Musculoskeletal: Normal range of motion.  Lymphadenopathy:    He has no cervical  adenopathy.  Neurological: He is alert and oriented to person, place, and time. No cranial nerve deficit.  Skin: Skin is warm and dry. He is not diaphoretic.  Psychiatric: He has a normal mood and affect. His behavior is normal. Judgment and thought content normal.  Nursing note and vitals reviewed.  Assessment/Plan: 1. Uncontrolled type 2 diabetes mellitus with hyperglycemia ([place]) Continue Leveimir at 50 units daily.  Also Trulicity at 1.5mg  weekly.  Will Follow up in 3 months now that patient is complaint with medications.   - POCT HgB A1C  2. Essential hypertension Stable, Will continue to follow.  Continue medications as directed.    3. Mixed hyperlipidemia Pt not taking medications as Statins give him allergic reactions.   General Counseling: selby foisy understanding of the findings of todays visit and agrees with plan of treatment. I have discussed any further diagnostic evaluation that may be needed or ordered today. We also reviewed his medications today. he has been encouraged to call the office with any questions or concerns that should arise related to todays visit.  Orders Placed This Encounter  Procedures  . POCT HgB A1C   Time spent: 25 Minutes  This patient was seen by Orson Gear AGNP-C in Collaboration with Dr Lavera Guise as a part of collaborative care agreement   Dr Lavera Guise Internal medicine

## 2017-10-12 NOTE — Patient Instructions (Signed)
Diabetes Mellitus and Nutrition When you have diabetes (diabetes mellitus), it is very important to have healthy eating habits because your blood sugar (glucose) levels are greatly affected by what you eat and drink. Eating healthy foods in the appropriate amounts, at about the same times every day, can help you:  Control your blood glucose.  Lower your risk of heart disease.  Improve your blood pressure.  Reach or maintain a healthy weight.  Every person with diabetes is different, and each person has different needs for a meal plan. Your health care provider may recommend that you work with a diet and nutrition specialist (dietitian) to make a meal plan that is best for you. Your meal plan may vary depending on factors such as:  The calories you need.  The medicines you take.  Your weight.  Your blood glucose, blood pressure, and cholesterol levels.  Your activity level.  Other health conditions you have, such as heart or kidney disease.  How do carbohydrates affect me? Carbohydrates affect your blood glucose level more than any other type of food. Eating carbohydrates naturally increases the amount of glucose in your blood. Carbohydrate counting is a method for keeping track of how many carbohydrates you eat. Counting carbohydrates is important to keep your blood glucose at a healthy level, especially if you use insulin or take certain oral diabetes medicines. It is important to know how many carbohydrates you can safely have in each meal. This is different for every person. Your dietitian can help you calculate how many carbohydrates you should have at each meal and for snack. Foods that contain carbohydrates include:  Bread, cereal, rice, pasta, and crackers.  Potatoes and corn.  Peas, beans, and lentils.  Milk and yogurt.  Fruit and juice.  Desserts, such as cakes, cookies, ice cream, and candy.  How does alcohol affect me? Alcohol can cause a sudden decrease in blood  glucose (hypoglycemia), especially if you use insulin or take certain oral diabetes medicines. Hypoglycemia can be a life-threatening condition. Symptoms of hypoglycemia (sleepiness, dizziness, and confusion) are similar to symptoms of having too much alcohol. If your health care provider says that alcohol is safe for you, follow these guidelines:  Limit alcohol intake to no more than 1 drink per day for nonpregnant women and 2 drinks per day for men. One drink equals 12 oz of beer, 5 oz of wine, or 1 oz of hard liquor.  Do not drink on an empty stomach.  Keep yourself hydrated with water, diet soda, or unsweetened iced tea.  Keep in mind that regular soda, juice, and other mixers may contain a lot of sugar and must be counted as carbohydrates.  What are tips for following this plan? Reading food labels  Start by checking the serving size on the label. The amount of calories, carbohydrates, fats, and other nutrients listed on the label are based on one serving of the food. Many foods contain more than one serving per package.  Check the total grams (g) of carbohydrates in one serving. You can calculate the number of servings of carbohydrates in one serving by dividing the total carbohydrates by 15. For example, if a food has 30 g of total carbohydrates, it would be equal to 2 servings of carbohydrates.  Check the number of grams (g) of saturated and trans fats in one serving. Choose foods that have low or no amount of these fats.  Check the number of milligrams (mg) of sodium in one serving. Most people   should limit total sodium intake to less than 2,300 mg per day.  Always check the nutrition information of foods labeled as "low-fat" or "nonfat". These foods may be higher in added sugar or refined carbohydrates and should be avoided.  Talk to your dietitian to identify your daily goals for nutrients listed on the label. Shopping  Avoid buying canned, premade, or processed foods. These  foods tend to be high in fat, sodium, and added sugar.  Shop around the outside edge of the grocery store. This includes fresh fruits and vegetables, bulk grains, fresh meats, and fresh dairy. Cooking  Use low-heat cooking methods, such as baking, instead of high-heat cooking methods like deep frying.  Cook using healthy oils, such as olive, canola, or sunflower oil.  Avoid cooking with butter, cream, or high-fat meats. Meal planning  Eat meals and snacks regularly, preferably at the same times every day. Avoid going long periods of time without eating.  Eat foods high in fiber, such as fresh fruits, vegetables, beans, and whole grains. Talk to your dietitian about how many servings of carbohydrates you can eat at each meal.  Eat 4-6 ounces of lean protein each day, such as lean meat, chicken, fish, eggs, or tofu. 1 ounce is equal to 1 ounce of meat, chicken, or fish, 1 egg, or 1/4 cup of tofu.  Eat some foods each day that contain healthy fats, such as avocado, nuts, seeds, and fish. Lifestyle   Check your blood glucose regularly.  Exercise at least 30 minutes 5 or more days each week, or as told by your health care provider.  Take medicines as told by your health care provider.  Do not use any products that contain nicotine or tobacco, such as cigarettes and e-cigarettes. If you need help quitting, ask your health care provider.  Work with a counselor or diabetes educator to identify strategies to manage stress and any emotional and social challenges. What are some questions to ask my health care provider?  Do I need to meet with a diabetes educator?  Do I need to meet with a dietitian?  What number can I call if I have questions?  When are the best times to check my blood glucose? Where to find more information:  American Diabetes Association: diabetes.org/food-and-fitness/food  Academy of Nutrition and Dietetics:  www.eatright.org/resources/health/diseases-and-conditions/diabetes  National Institute of Diabetes and Digestive and Kidney Diseases (NIH): www.niddk.nih.gov/health-information/diabetes/overview/diet-eating-physical-activity Summary  A healthy meal plan will help you control your blood glucose and maintain a healthy lifestyle.  Working with a diet and nutrition specialist (dietitian) can help you make a meal plan that is best for you.  Keep in mind that carbohydrates and alcohol have immediate effects on your blood glucose levels. It is important to count carbohydrates and to use alcohol carefully. This information is not intended to replace advice given to you by your health care provider. Make sure you discuss any questions you have with your health care provider. Document Released: 10/17/2004 Document Revised: 02/25/2016 Document Reviewed: 02/25/2016 Elsevier Interactive Patient Education  2018 [employer]  

## 2017-10-26 DIAGNOSIS — G4733 Obstructive sleep apnea (adult) (pediatric): Secondary | ICD-10-CM | POA: Diagnosis not present

## 2017-11-05 ENCOUNTER — Other Ambulatory Visit: Payer: Self-pay | Admitting: Nurse Practitioner

## 2017-11-05 DIAGNOSIS — E1159 Type 2 diabetes mellitus with other circulatory complications: Secondary | ICD-10-CM

## 2017-11-05 MED ORDER — INSULIN DETEMIR 100 UNIT/ML FLEXPEN: PEN_INJECTOR | SUBCUTANEOUS | 11 refills | Status: DC

## 2017-11-20 ENCOUNTER — Ambulatory Visit: Payer: 59 | Admitting: Adult Health

## 2017-11-20 ENCOUNTER — Encounter: Payer: Self-pay | Admitting: Adult Health

## 2017-11-20 VITALS — BP 142/98 | HR 95 | Temp 98.4°F | Resp 16 | Ht 74.0 in | Wt 335.0 lb

## 2017-11-20 DIAGNOSIS — J029 Acute pharyngitis, unspecified: Secondary | ICD-10-CM | POA: Diagnosis not present

## 2017-11-20 DIAGNOSIS — R05 Cough: Secondary | ICD-10-CM

## 2017-11-20 DIAGNOSIS — J0110 Acute frontal sinusitis, unspecified: Secondary | ICD-10-CM | POA: Diagnosis not present

## 2017-11-20 DIAGNOSIS — R059 Cough, unspecified: Secondary | ICD-10-CM

## 2017-11-20 DIAGNOSIS — R6889 Other general symptoms and signs: Secondary | ICD-10-CM

## 2017-11-20 LAB — POC INFLUENZA TEST: Negative: NEGATIVE

## 2017-11-20 LAB — POCT RAPID STREP A (OFFICE): RAPID STREP A SCREEN: NEGATIVE

## 2017-11-20 MED ORDER — HYDROCOD POLST-CPM POLST ER 10-8 MG/5ML PO SUER: 5.0000 mL | Freq: Two times a day (BID) | ORAL | 0 refills | Status: DC | PRN

## 2017-11-20 MED ORDER — METHYLPREDNISOLONE ACETATE 80 MG/ML IJ SUSP
80.0000 mg | Freq: Once | INTRAMUSCULAR | Status: AC
Start: 2017-11-20 — End: 2017-11-20
  Administered 2017-11-20: 40 mg via INTRAMUSCULAR

## 2017-11-20 MED ORDER — AMOXICILLIN-POT CLAVULANATE 875-125 MG PO TABS
1.0000 | ORAL_TABLET | Freq: Two times a day (BID) | ORAL | 0 refills | Status: DC
Start: 2017-11-20 — End: 2018-01-09

## 2017-11-20 NOTE — Progress Notes (Signed)
[facility] [place], [place] [address]  Internal MEDICINE  Office Visit Note  Patient Name: Gerald [name]  [id number]  [id number]  Date of Service: 11/20/2017  Chief Complaint  Patient presents with  . Sore Throat  . Headache  . Fever     HPI Pt is here for a sick visit, she reports a sore throat, headache, fever, and body aches.  He states the symptoms arrived approximately 3 to 4 days ago.  He is afebrile at this visit.  Patient is visibly ill.  Denies vomiting or diarrhea.  He reports little OTC medications to combat symptoms however none of them seem to be working.  He denies any sick contacts.     Current Medication:  Outpatient Encounter Medications as of 11/20/2017  Medication Sig  . acetaminophen (TYLENOL) 325 MG tablet Take 325-650 mg by mouth every 6 (six) hours as needed for mild pain or headache.  . albuterol (PROVENTIL HFA;VENTOLIN HFA) 108 (90 Base) MCG/ACT inhaler Inhale 2 puffs into the lungs every 6 (six) hours as needed for wheezing or shortness of breath.  . citalopram (CELEXA) 40 MG tablet TAKE 1 TABLET BY MOUTH EVERY DAY FOR GAD  . Dulaglutide (TRULICITY) 1.5 OV/5.6EP SOPN Inject 1.5 mg into the skin every Wednesday.  . Insulin Detemir (LEVEMIR FLEXTOUCH) 100 UNIT/ML Pen Inject 50units [place] daily.  Marland Kitchen lisinopril-hydrochlorothiazide (PRINZIDE,ZESTORETIC) 20-25 MG tablet Take 1 tablet by mouth daily.  . pantoprazole (PROTONIX) 40 MG tablet Take 1 tablet (40 mg total) by mouth 2 (two) times daily before a meal.  . amoxicillin-clavulanate (AUGMENTIN) 875-125 MG tablet Take 1 tablet by mouth 2 (two) times daily.  . chlorpheniramine-HYDROcodone (TUSSIONEX PENNKINETIC ER) 10-8 MG/5ML SUER Take 5 mLs by mouth every 12 (twelve) hours as needed for cough.  . [EXPIRED] methylPREDNISolone acetate (DEPO-MEDROL) injection 80 mg    No facility-administered encounter medications on file as of 11/20/2017.       Medical History: Past  Medical History:  Diagnosis Date  . Abnormal LFTs   . Coronary artery disease    Mild nonobstructive coronary artery disease on previous cardiac catheterization most recently in August of 2013  . Diabetes mellitus without complication (HCC)    borderline  . Fundic gland polyps of stomach, benign   . Gastritis   . GERD (gastroesophageal reflux disease)   . Hepatic steatosis   . Hyperlipidemia   . Hypertension   . Myocardial infarction ([place]) 09/2011  . Nonischemic cardiomyopathy (HCC)    Previous ejection fraction of 35-40% on echo. 50% on cardiac catheterization in August of 2013  . Obesity   . Sleep apnea      Vital Signs: BP (!) 142/98   Pulse 95   Temp 98.4 F (36.9 C)   Resp 16   Ht 6\' 2"  (1.88 m)   Wt (!) 335 lb (152 kg)   SpO2 95%   BMI 43.01 kg/m    Review of Systems  Physical Exam  Constitutional: He appears well-developed.  HENT:  Head: Normocephalic.  Right Ear: Hearing, tympanic membrane and ear canal normal.  Left Ear: Hearing, tympanic membrane and ear canal normal.  Mouth/Throat: Uvula is midline and mucous membranes are normal. Posterior oropharyngeal edema and posterior oropharyngeal erythema present.  Eyes: Pupils are equal, round, and reactive to light.  Neck: Normal range of motion.  Pulmonary/Chest: Effort normal and breath sounds normal.  Abdominal: Soft.  Neurological: He is alert.  Skin: Skin is warm and dry. Capillary refill  takes less than 2 seconds.  Psychiatric: He has a normal mood and affect.   Assessment/Plan: 1. Flu-like symptoms Flu test negative at this visit. - POC INFLUENZA TEST  2. Sore throat Strep negative at this visit. - POCT rapid strep A  3. Acute non-recurrent frontal sinusitis Patient has tenderness over sinuses.  He describes posterior nasal drainage.  Will treat with course of Augmentin.  Also giving injection of steroids in office at this visit due to his cough. - amoxicillin-clavulanate (AUGMENTIN) 875-125 MG  tablet; Take 1 tablet by mouth 2 (two) times daily.  Dispense: 20 tablet; Refill: 0  4. Cough Given prescription for Tussionex cough syrup.  Discussed safety while using cough syrup.  Encourage patient to not drive or operate machinery while using this medication.  He verbalized understanding. - chlorpheniramine-HYDROcodone (TUSSIONEX PENNKINETIC ER) 10-8 MG/5ML SUER; Take 5 mLs by mouth every 12 (twelve) hours as needed for cough.  Dispense: 115 mL; Refill: 0  General Counseling: Hanley verbalizes understanding of the findings of todays visit and agrees with plan of treatment. I have discussed any further diagnostic evaluation that may be needed or ordered today. We also reviewed his medications today. he has been encouraged to call the office with any questions or concerns that should arise related to todays visit.   Orders Placed This Encounter  Procedures  . POC INFLUENZA TEST  . POCT rapid strep A    Meds ordered this encounter  Medications  . amoxicillin-clavulanate (AUGMENTIN) 875-125 MG tablet    Sig: Take 1 tablet by mouth 2 (two) times daily.    Dispense:  20 tablet    Refill:  0  . chlorpheniramine-HYDROcodone (TUSSIONEX PENNKINETIC ER) 10-8 MG/5ML SUER    Sig: Take 5 mLs by mouth every 12 (twelve) hours as needed for cough.    Dispense:  115 mL    Refill:  0  . methylPREDNISolone acetate (DEPO-MEDROL) injection 80 mg    Time spent: 25 Minutes  This patient was seen by Orson Gear AGNP-C in Collaboration with Dr Lavera Guise as a part of collaborative care agreement.  Kendell Bane AGNP-C Internal Medicine

## 2017-11-25 DIAGNOSIS — G4733 Obstructive sleep apnea (adult) (pediatric): Secondary | ICD-10-CM | POA: Diagnosis not present

## 2017-12-25 DIAGNOSIS — G4733 Obstructive sleep apnea (adult) (pediatric): Secondary | ICD-10-CM | POA: Diagnosis not present

## 2018-01-07 ENCOUNTER — Emergency Department: Payer: 59

## 2018-01-07 ENCOUNTER — Inpatient Hospital Stay: Payer: 59

## 2018-01-07 ENCOUNTER — Inpatient Hospital Stay
Admission: EM | Admit: 2018-01-07 | Discharge: 2018-01-09 | DRG: 313 | Disposition: A | Discharge status: Home / Self Care | Payer: 59 | Attending: Internal Medicine | Admitting: Internal Medicine

## 2018-01-07 ENCOUNTER — Encounter: Payer: Self-pay | Admitting: Emergency Medicine

## 2018-01-07 ENCOUNTER — Other Ambulatory Visit: Payer: Self-pay

## 2018-01-07 DIAGNOSIS — Z79899 Other long term (current) drug therapy: Secondary | ICD-10-CM | POA: Diagnosis not present

## 2018-01-07 DIAGNOSIS — I25110 Atherosclerotic heart disease of native coronary artery with unstable angina pectoris: Secondary | ICD-10-CM | POA: Diagnosis present

## 2018-01-07 DIAGNOSIS — Z23 Encounter for immunization: Secondary | ICD-10-CM | POA: Diagnosis not present

## 2018-01-07 DIAGNOSIS — Z888 Allergy status to other drugs, medicaments and biological substances status: Secondary | ICD-10-CM | POA: Diagnosis not present

## 2018-01-07 DIAGNOSIS — G4733 Obstructive sleep apnea (adult) (pediatric): Secondary | ICD-10-CM | POA: Diagnosis present

## 2018-01-07 DIAGNOSIS — K219 Gastro-esophageal reflux disease without esophagitis: Secondary | ICD-10-CM | POA: Diagnosis present

## 2018-01-07 DIAGNOSIS — E118 Type 2 diabetes mellitus with unspecified complications: Secondary | ICD-10-CM | POA: Diagnosis not present

## 2018-01-07 DIAGNOSIS — R079 Chest pain, unspecified: Secondary | ICD-10-CM | POA: Diagnosis not present

## 2018-01-07 DIAGNOSIS — Z8711 Personal history of peptic ulcer disease: Secondary | ICD-10-CM | POA: Diagnosis not present

## 2018-01-07 DIAGNOSIS — R0789 Other chest pain: Secondary | ICD-10-CM | POA: Diagnosis not present

## 2018-01-07 DIAGNOSIS — I252 Old myocardial infarction: Secondary | ICD-10-CM

## 2018-01-07 DIAGNOSIS — E1165 Type 2 diabetes mellitus with hyperglycemia: Secondary | ICD-10-CM | POA: Diagnosis present

## 2018-01-07 DIAGNOSIS — I10 Essential (primary) hypertension: Secondary | ICD-10-CM | POA: Diagnosis present

## 2018-01-07 DIAGNOSIS — G8929 Other chronic pain: Secondary | ICD-10-CM | POA: Diagnosis present

## 2018-01-07 DIAGNOSIS — Z794 Long term (current) use of insulin: Secondary | ICD-10-CM | POA: Diagnosis not present

## 2018-01-07 DIAGNOSIS — N62 Hypertrophy of breast: Secondary | ICD-10-CM | POA: Diagnosis present

## 2018-01-07 DIAGNOSIS — K2950 Unspecified chronic gastritis without bleeding: Secondary | ICD-10-CM | POA: Diagnosis present

## 2018-01-07 DIAGNOSIS — Z8249 Family history of ischemic heart disease and other diseases of the circulatory system: Secondary | ICD-10-CM | POA: Diagnosis not present

## 2018-01-07 DIAGNOSIS — E785 Hyperlipidemia, unspecified: Secondary | ICD-10-CM | POA: Diagnosis present

## 2018-01-07 DIAGNOSIS — K7581 Nonalcoholic steatohepatitis (NASH): Secondary | ICD-10-CM | POA: Diagnosis not present

## 2018-01-07 DIAGNOSIS — I428 Other cardiomyopathies: Secondary | ICD-10-CM | POA: Diagnosis present

## 2018-01-07 DIAGNOSIS — E782 Mixed hyperlipidemia: Secondary | ICD-10-CM

## 2018-01-07 DIAGNOSIS — I200 Unstable angina: Secondary | ICD-10-CM | POA: Diagnosis not present

## 2018-01-07 DIAGNOSIS — R0602 Shortness of breath: Secondary | ICD-10-CM | POA: Diagnosis not present

## 2018-01-07 DIAGNOSIS — G4730 Sleep apnea, unspecified: Secondary | ICD-10-CM | POA: Diagnosis present

## 2018-01-07 DIAGNOSIS — K760 Fatty (change of) liver, not elsewhere classified: Secondary | ICD-10-CM | POA: Diagnosis not present

## 2018-01-07 LAB — CBC WITH DIFFERENTIAL/PLATELET
Abs Immature Granulocytes: 0.18 10*3/uL — ABNORMAL HIGH (ref 0.00–0.07)
BASOS ABS: 0.1 10*3/uL (ref 0.0–0.1)
Basophils Relative: 1 %
EOS PCT: 2 %
Eosinophils Absolute: 0.2 10*3/uL (ref 0.0–0.5)
HCT: 40.7 % (ref 39.0–52.0)
HEMOGLOBIN: 13.8 g/dL (ref 13.0–17.0)
Immature Granulocytes: 2 %
LYMPHS PCT: 38 %
Lymphs Abs: 3.4 10*3/uL (ref 0.7–4.0)
MCH: 28.9 pg (ref 26.0–34.0)
MCHC: 33.9 g/dL (ref 30.0–36.0)
MCV: 85.1 fL (ref 80.0–100.0)
MONO ABS: 0.6 10*3/uL (ref 0.1–1.0)
MONOS PCT: 7 %
Neutro Abs: 4.6 10*3/uL (ref 1.7–7.7)
Neutrophils Relative %: 50 %
PLATELETS: 271 10*3/uL (ref 150–400)
RBC: 4.78 MIL/uL (ref 4.22–5.81)
RDW: 13.4 % (ref 11.5–15.5)
WBC: 9 10*3/uL (ref 4.0–10.5)
nRBC: 0 % (ref 0.0–0.2)

## 2018-01-07 LAB — LIPID PANEL
Cholesterol: 219 mg/dL — ABNORMAL HIGH (ref 0–200)
HDL: 33 mg/dL — ABNORMAL LOW (ref >40)
LDL Cholesterol: 138 mg/dL — ABNORMAL HIGH (ref 0–99)
Total CHOL/HDL Ratio: 6.6 RATIO
Triglycerides: 240 mg/dL — ABNORMAL HIGH (ref <150)
VLDL: 48 mg/dL — AB (ref 0–40)

## 2018-01-07 LAB — COMPREHENSIVE METABOLIC PANEL
ALBUMIN: 3.9 g/dL (ref 3.5–5.0)
ALK PHOS: 82 U/L (ref 38–126)
ALT: 69 U/L — ABNORMAL HIGH (ref 0–44)
AST: 49 U/L — AB (ref 15–41)
Anion gap: 7 (ref 5–15)
BILIRUBIN TOTAL: 0.6 mg/dL (ref 0.3–1.2)
BUN: 15 mg/dL (ref 6–20)
CALCIUM: 8.8 mg/dL — AB (ref 8.9–10.3)
CO2: 30 mmol/L (ref 22–32)
CREATININE: 0.79 mg/dL (ref 0.61–1.24)
Chloride: 100 mmol/L (ref 98–111)
GFR calc Af Amer: >60 mL/min (ref >60)
GLUCOSE: 236 mg/dL — AB (ref 70–99)
Potassium: 3.6 mmol/L (ref 3.5–5.1)
Sodium: 137 mmol/L (ref 135–145)
TOTAL PROTEIN: 7.2 g/dL (ref 6.5–8.1)

## 2018-01-07 LAB — MRSA PCR SCREENING: MRSA BY PCR: NEGATIVE

## 2018-01-07 LAB — INFLUENZA PANEL BY PCR (TYPE A & B)
Influenza A By PCR: NEGATIVE
Influenza B By PCR: NEGATIVE

## 2018-01-07 LAB — TROPONIN I
Troponin I: <0.03 ng/mL (ref <0.03)
Troponin I: <0.03 ng/mL (ref <0.03)
Troponin I: <0.03 ng/mL (ref <0.03)

## 2018-01-07 LAB — MAGNESIUM: Magnesium: 1.8 mg/dL (ref 1.7–2.4)

## 2018-01-07 LAB — GLUCOSE, CAPILLARY
Glucose-Capillary: 170 mg/dL — ABNORMAL HIGH (ref 70–99)
Glucose-Capillary: 157 mg/dL — ABNORMAL HIGH (ref 70–99)
Glucose-Capillary: 180 mg/dL — ABNORMAL HIGH (ref 70–99)

## 2018-01-07 LAB — PROTIME-INR
INR: 1.05
PROTHROMBIN TIME: 13.6 s (ref 11.4–15.2)

## 2018-01-07 LAB — PHOSPHORUS: Phosphorus: 3.2 mg/dL (ref 2.5–4.6)

## 2018-01-07 LAB — BRAIN NATRIURETIC PEPTIDE: B NATRIURETIC PEPTIDE 5: 27 pg/mL (ref 0.0–100.0)

## 2018-01-07 LAB — APTT: aPTT: 32 seconds (ref 24–36)

## 2018-01-07 LAB — HEPARIN LEVEL (UNFRACTIONATED): Heparin Unfractionated: 0.12 IU/mL — ABNORMAL LOW (ref 0.30–0.70)

## 2018-01-07 MED ORDER — HEPARIN BOLUS VIA INFUSION
4000.0000 [IU] | Freq: Once | INTRAVENOUS | Status: AC
  Administered 2018-01-07: 4000 [IU] via INTRAVENOUS
  Filled 2018-01-07: qty 4000

## 2018-01-07 MED ORDER — SENNOSIDES-DOCUSATE SODIUM 8.6-50 MG PO TABS: 1.0000 | ORAL_TABLET | Freq: Every evening | ORAL | Status: DC | PRN

## 2018-01-07 MED ORDER — HYDROCHLOROTHIAZIDE 25 MG PO TABS
25.0000 mg | ORAL_TABLET | Freq: Every day | ORAL | Status: DC
  Administered 2018-01-08 – 2018-01-09 (×2): 25 mg via ORAL
  Filled 2018-01-07 (×3): qty 1

## 2018-01-07 MED ORDER — NITROGLYCERIN IN D5W 200-5 MCG/ML-% IV SOLN
0.0000 ug/min | INTRAVENOUS | Status: DC
  Administered 2018-01-07: 10 ug/min via INTRAVENOUS

## 2018-01-07 MED ORDER — INFLUENZA VAC SPLIT QUAD 0.5 ML IM SUSY
0.5000 mL | PREFILLED_SYRINGE | INTRAMUSCULAR | Status: AC
  Administered 2018-01-09: 0.5 mL via INTRAMUSCULAR
  Filled 2018-01-07: qty 0.5

## 2018-01-07 MED ORDER — NITROGLYCERIN 0.4 MG SL SUBL
SUBLINGUAL_TABLET | SUBLINGUAL | Status: AC
  Administered 2018-01-07: 0.4 mg via SUBLINGUAL
  Filled 2018-01-07: qty 3

## 2018-01-07 MED ORDER — MORPHINE SULFATE (PF) 2 MG/ML IV SOLN
2.0000 mg | INTRAVENOUS | Status: DC | PRN
  Administered 2018-01-07 – 2018-01-09 (×6): 2 mg via INTRAVENOUS
  Filled 2018-01-07 (×6): qty 1

## 2018-01-07 MED ORDER — ALBUTEROL SULFATE (2.5 MG/3ML) 0.083% IN NEBU: 2.5000 mg | INHALATION_SOLUTION | Freq: Four times a day (QID) | RESPIRATORY_TRACT | Status: DC | PRN

## 2018-01-07 MED ORDER — CITALOPRAM HYDROBROMIDE 20 MG PO TABS
40.0000 mg | ORAL_TABLET | Freq: Every day | ORAL | Status: DC
  Administered 2018-01-07 – 2018-01-09 (×3): 40 mg via ORAL
  Filled 2018-01-07 (×4): qty 2

## 2018-01-07 MED ORDER — ACETAMINOPHEN 325 MG PO TABS
650.0000 mg | ORAL_TABLET | ORAL | Status: DC | PRN
  Administered 2018-01-08: 650 mg via ORAL
  Filled 2018-01-07: qty 2

## 2018-01-07 MED ORDER — ONDANSETRON HCL 4 MG/2ML IJ SOLN
4.0000 mg | Freq: Four times a day (QID) | INTRAMUSCULAR | Status: DC | PRN
  Administered 2018-01-08 – 2018-01-09 (×2): 4 mg via INTRAVENOUS
  Filled 2018-01-07 (×3): qty 2

## 2018-01-07 MED ORDER — NITROGLYCERIN 0.4 MG SL SUBL: 0.4000 mg | SUBLINGUAL_TABLET | SUBLINGUAL | Status: DC | PRN

## 2018-01-07 MED ORDER — LIDOCAINE VISCOUS HCL 2 % MT SOLN
15.0000 mL | Freq: Once | OROMUCOSAL | Status: AC
  Administered 2018-01-07: 15 mL via ORAL
  Filled 2018-01-07: qty 15

## 2018-01-07 MED ORDER — LISINOPRIL-HYDROCHLOROTHIAZIDE 20-25 MG PO TABS: 1.0000 | ORAL_TABLET | Freq: Every day | ORAL | Status: DC

## 2018-01-07 MED ORDER — PANTOPRAZOLE SODIUM 40 MG PO TBEC
40.0000 mg | DELAYED_RELEASE_TABLET | Freq: Two times a day (BID) | ORAL | Status: DC
  Administered 2018-01-07 – 2018-01-09 (×4): 40 mg via ORAL
  Filled 2018-01-07 (×4): qty 1

## 2018-01-07 MED ORDER — INSULIN ASPART 100 UNIT/ML ~~LOC~~ SOLN: 0.0000 [IU] | Freq: Every day | SUBCUTANEOUS | Status: DC

## 2018-01-07 MED ORDER — IOHEXOL 350 MG/ML SOLN
75.0000 mL | Freq: Once | INTRAVENOUS | Status: AC | PRN
  Administered 2018-01-07: 75 mL via INTRAVENOUS

## 2018-01-07 MED ORDER — HEPARIN (PORCINE) 25000 UT/250ML-% IV SOLN
1400.0000 [IU]/h | INTRAVENOUS | Status: DC
  Administered 2018-01-07: 1400 [IU]/h via INTRAVENOUS
  Filled 2018-01-07 (×2): qty 250

## 2018-01-07 MED ORDER — INSULIN GLARGINE 100 UNIT/ML ~~LOC~~ SOLN
35.0000 [IU] | Freq: Every day | SUBCUTANEOUS | Status: DC
  Administered 2018-01-07 – 2018-01-08 (×2): 35 [IU] via SUBCUTANEOUS
  Filled 2018-01-07 (×4): qty 0.35

## 2018-01-07 MED ORDER — MORPHINE SULFATE (PF) 4 MG/ML IV SOLN
4.0000 mg | INTRAVENOUS | Status: DC | PRN
  Administered 2018-01-07 – 2018-01-09 (×5): 4 mg via INTRAVENOUS
  Filled 2018-01-07 (×6): qty 1

## 2018-01-07 MED ORDER — INSULIN ASPART 100 UNIT/ML ~~LOC~~ SOLN
0.0000 [IU] | Freq: Three times a day (TID) | SUBCUTANEOUS | Status: DC
  Administered 2018-01-07: 3 [IU] via SUBCUTANEOUS
  Administered 2018-01-08 (×2): 2 [IU] via SUBCUTANEOUS
  Administered 2018-01-08: 5 [IU] via SUBCUTANEOUS
  Filled 2018-01-07 (×4): qty 1

## 2018-01-07 MED ORDER — ROSUVASTATIN CALCIUM 5 MG PO TABS
5.0000 mg | ORAL_TABLET | Freq: Every day | ORAL | Status: DC
  Administered 2018-01-07 – 2018-01-08 (×2): 5 mg via ORAL
  Filled 2018-01-07 (×3): qty 1

## 2018-01-07 MED ORDER — CARVEDILOL 3.125 MG PO TABS
3.1250 mg | ORAL_TABLET | Freq: Two times a day (BID) | ORAL | Status: DC
  Administered 2018-01-07 – 2018-01-09 (×4): 3.125 mg via ORAL
  Filled 2018-01-07 (×4): qty 1

## 2018-01-07 MED ORDER — ASPIRIN EC 81 MG PO TBEC
81.0000 mg | DELAYED_RELEASE_TABLET | Freq: Every day | ORAL | Status: DC
  Administered 2018-01-08 – 2018-01-09 (×2): 81 mg via ORAL
  Filled 2018-01-07 (×2): qty 1

## 2018-01-07 MED ORDER — ENOXAPARIN SODIUM 40 MG/0.4ML ~~LOC~~ SOLN
40.0000 mg | Freq: Two times a day (BID) | SUBCUTANEOUS | Status: DC
  Administered 2018-01-07 – 2018-01-09 (×4): 40 mg via SUBCUTANEOUS
  Filled 2018-01-07 (×4): qty 0.4

## 2018-01-07 MED ORDER — NITROGLYCERIN 0.4 MG SL SUBL
0.4000 mg | SUBLINGUAL_TABLET | SUBLINGUAL | Status: DC | PRN
  Administered 2018-01-07: 0.4 mg via SUBLINGUAL

## 2018-01-07 MED ORDER — ASPIRIN 81 MG PO CHEW
324.0000 mg | CHEWABLE_TABLET | Freq: Once | ORAL | Status: AC
  Administered 2018-01-07: 324 mg via ORAL
  Filled 2018-01-07: qty 4

## 2018-01-07 MED ORDER — LISINOPRIL 20 MG PO TABS
20.0000 mg | ORAL_TABLET | Freq: Every day | ORAL | Status: DC
  Administered 2018-01-07 – 2018-01-09 (×3): 20 mg via ORAL
  Filled 2018-01-07 (×3): qty 1

## 2018-01-07 MED ORDER — BISACODYL 5 MG PO TBEC: 5.0000 mg | DELAYED_RELEASE_TABLET | Freq: Every day | ORAL | Status: DC | PRN

## 2018-01-07 MED ORDER — ALUM & MAG HYDROXIDE-SIMETH 200-200-20 MG/5ML PO SUSP
30.0000 mL | Freq: Once | ORAL | Status: AC
  Administered 2018-01-07: 30 mL via ORAL
  Filled 2018-01-07: qty 30

## 2018-01-07 NOTE — ED Notes (Signed)
IV heparin compatibility with nitro drip verified via micromedix, dose and rate verified with Elmo Putt RN prior to start.

## 2018-01-07 NOTE — Progress Notes (Signed)
Spoke with Dr Rockey Situ. Patient states "Im having sharp and dull chest pain that goes to left arm and leg. At this point Dr Rockey Situ has reviewed results-negative troponin, CT. He will come by to evaluate patient.

## 2018-01-07 NOTE — ED Provider Notes (Signed)
[facility] Emergency Department Provider Note  ____________________________________________   First MD Initiated Contact with Patient 01/07/18 [phone]     (approximate)  I have reviewed the triage vital signs and the nursing notes.   HISTORY  Chief Complaint Chest Pain    HPI Gerald [name] is a 52 y.o. male who self presents to the emergency department with substernal pressure-like chest pain that began around 3 AM roughly 2-1/2 hours prior to arrival.  The patient works third shift and while at work he noticed gradual onset slowly progressive crushing substernal chest pain associated with nausea and he began to sweat.  His symptoms were worsened by exertion but still persisted at rest.  He began to feel somewhat short of breath.  He then went to drive home and while in the car he began to sweat profusely and he had to pull over to vomit.  He now has pain and numbness radiating down his left arm and down his left leg.  His symptoms were not ripping or tearing and not sudden onset.  He has no history of DVT or pulmonary embolism.  No recent surgery travel or immobilization.  No leg swelling.  He does have known single-vessel coronary artery disease and he takes aspirin several days a week.  He has never had a stent before.  He did have a fever to 102 degrees about 5 days ago and he has had a dry cough for the past 2 weeks or so.  He has had no change in his medications recently.    Past Medical History:  Diagnosis Date  . Abnormal LFTs   . Coronary artery disease    Mild nonobstructive coronary artery disease on previous cardiac catheterization most recently in August of 2013  . Diabetes mellitus without complication (HCC)    borderline  . Fundic gland polyps of stomach, benign   . Gastritis   . GERD (gastroesophageal reflux disease)   . Hepatic steatosis   . Hyperlipidemia   . Hypertension   . Myocardial infarction ([place]) 09/2011  . Nonischemic  cardiomyopathy (HCC)    Previous ejection fraction of 35-40% on echo. 50% on cardiac catheterization in August of 2013  . Obesity   . Sleep apnea     Patient Active Problem List   Diagnosis Date Noted  . Gastroesophageal reflux disease without esophagitis 08/23/2017  . Urinary tract infection without hematuria 06/14/2017  . Acute non-recurrent sinusitis 06/14/2017  . Generalized anxiety disorder 02/02/2017  . Other fatigue 02/02/2017  . Abnormal results of liver function studies 02/02/2017  . Myalgia 02/02/2017  . Edema, unspecified 02/02/2017  . Peripheral vascular disease ([place]) 02/02/2017  . Circadian rhythm sleep disorder, shift work type 02/02/2017  . Acne vulgaris 02/02/2017  . Occlusion and stenosis of bilateral carotid arteries 02/02/2017  . Abnormal weight gain 02/02/2017  . Pain in unspecified knee 02/02/2017  . Neoplasm of uncertain behavior of skin 02/02/2017  . Hypersomnia, unspecified 02/02/2017  . Shortness of breath 02/02/2017  . Morbid obesity ([place]) 07/07/2016  . Chest pain 06/08/2016  . Other chest pain 05/22/2014  . Mass of jaw 05/22/2014  . Right flank pain 11/23/2013  . Dysuria 11/23/2013  . Encounter for wellness examination 11/08/2013  . Retaining fluid 11/08/2013  . Urinary incontinence 03/14/2013  . Depression 01/13/2013  . Type 2 diabetes mellitus ([place]) 09/14/2012  . Nonischemic cardiomyopathy ([place])   . Mixed hyperlipidemia 05/28/2012  . CAD (coronary artery disease) 03/30/2012  . Chronic pain syndrome  03/30/2012  . HTN (hypertension) 03/30/2012    Past Surgical History:  Procedure Laterality Date  . CARDIAC CATHETERIZATION N/A 09/2011   [facility]; EF 50% with 30% mid LAD stenosis and no obstructive disease.  Marland Kitchen CARDIAC CATHETERIZATION  09/2010   [facility]; Mid LAD 40% stenosis; Mid Circumflex:Normal; Mid RCA; Normal  . CARDIAC CATHETERIZATION    . COLONOSCOPY    . ESOPHAGOGASTRODUODENOSCOPY (EGD) WITH PROPOFOL N/A 06/10/2017   Procedure:  ESOPHAGOGASTRODUODENOSCOPY (EGD) WITH PROPOFOL;  Surgeon: Jonathon Bellows, MD;  Location: [facility] ENDOSCOPY;  Service: Gastroenterology;  Laterality: N/A;  . KNEE ARTHROSCOPY WITH MEDIAL MENISECTOMY Right 09/16/2012   Procedure: RIGHT KNEE ARTHROSCOPY WITH MEDIAL AND LATERAL MENISECTOMY, CHONDROPLASTY;  Surgeon: Ninetta Lights, MD;  Location: [place];  Service: Orthopedics;  Laterality: Right;  RIGHT KNEE SCOPE MEDIAL MENISCECTOMY  . LEFT HEART CATH AND CORONARY ANGIOGRAPHY N/A 06/11/2016   Procedure: Left Heart Cath and Coronary Angiography;  Surgeon: Minna Merritts, MD;  Location: [place] CV LAB;  Service: Cardiovascular;  Laterality: N/A;    Prior to Admission medications   Medication Sig Start Date End Date Taking? Authorizing Provider  acetaminophen (TYLENOL) 325 MG tablet Take 325-650 mg by mouth every 6 (six) hours as needed for mild pain or headache.   Yes [provider]  albuterol (PROVENTIL HFA;VENTOLIN HFA) 108 (90 Base) MCG/ACT inhaler Inhale 2 puffs into the lungs every 6 (six) hours as needed for wheezing or shortness of breath. 02/25/17  Yes Nance Pear, MD  citalopram (CELEXA) 40 MG tablet TAKE 1 TABLET BY MOUTH EVERY DAY FOR GAD 07/30/17  Yes Ronnell Freshwater, NP  Dulaglutide (TRULICITY) 1.5 RJ/1.8AC SOPN Inject 1.5 mg into the skin every Wednesday. 08/05/17  Yes Boscia, Greer Ee, NP  Insulin Detemir (LEVEMIR FLEXTOUCH) 100 UNIT/ML Pen Inject 50units [place] daily. Patient taking differently: Inject 50 Units into the skin every evening. Inject 50units [place] daily. 11/05/17  Yes Boscia, Greer Ee, NP  lisinopril-hydrochlorothiazide (PRINZIDE,ZESTORETIC) 20-25 MG tablet Take 1 tablet by mouth daily. 07/30/17  Yes Ronnell Freshwater, NP  amoxicillin-clavulanate (AUGMENTIN) 875-125 MG tablet Take 1 tablet by mouth 2 (two) times daily. Patient not taking: Reported on 01/07/2018 11/20/17   Kendell Bane, NP  chlorpheniramine-HYDROcodone [facility] PENNKINETIC ER)  10-8 MG/5ML SUER Take 5 mLs by mouth every 12 (twelve) hours as needed for cough. Patient not taking: Reported on 01/07/2018 11/20/17   Kendell Bane, NP  pantoprazole (PROTONIX) 40 MG tablet Take 1 tablet (40 mg total) by mouth 2 (two) times daily before a meal. Patient not taking: Reported on 01/07/2018 09/30/17   Ronnell Freshwater, NP    Allergies Statins  Family History  Problem Relation Age of Onset  . Heart disease Father 36       MI  . Heart attack Father   . Stomach cancer Father   . Hypertension Mother   . Breast cancer Mother     Social History Social History   Tobacco Use  . Smoking status: Never Smoker  . Smokeless tobacco: Never Used  Substance Use Topics  . Alcohol use: Not Currently    Alcohol/week: 0.0 standard drinks    Comment: rare  . Drug use: No    Review of Systems Constitutional: Positive for fevers Eyes: No visual changes. ENT: No sore throat. Cardiovascular: Positive for chest pain. Respiratory: Positive for shortness of breath. Gastrointestinal: No abdominal pain.  Positive for nausea, positive for vomiting.  No diarrhea.  No constipation. Genitourinary: Negative for  dysuria. Musculoskeletal: Negative for back pain. Skin: Negative for rash. Neurological: Negative for headaches, focal weakness or numbness.   ____________________________________________   PHYSICAL EXAM:  VITAL SIGNS: ED Triage Vitals [01/07/18 0543]  Enc Vitals Group     BP      Pulse      Resp      Temp      Temp src      SpO2      Weight (!) 330 lb (149.7 kg)     Height 6\' 2"  (1.88 m)     Head Circumference      Peak Flow      Pain Score 8     Pain Loc      Pain Edu?      Excl. in [place]?     Constitutional: Alert and oriented x4 appears anxious and tearful obviously quite uncomfortable Eyes: PERRL EOMI. Head: Atraumatic. Nose: No congestion/rhinnorhea. Mouth/Throat: No trismus Neck: No stridor.  Able to lie completely flat with no JVD  appreciated Cardiovascular: Normal rate, regular rhythm. Grossly normal heart sounds.  Good peripheral circulation. Respiratory: Normal respiratory effort.  No retractions. Lungs CTAB and moving good air Gastrointestinal: Obese soft nontender Musculoskeletal: Legs are equal in size.  1+ pitting edema to bilateral mid shin Neurologic:  Normal speech and language. No gross focal neurologic deficits are appreciated. Skin: Mildly diaphoretic Psychiatric: Anxious appearing    ____________________________________________   DIFFERENTIAL includes but not limited to  STEMI, non-STEMI, unstable angina, aortic dissection, pulmonary embolism, Boerhaave syndrome, pancreatitis, influenza, pneumonia ____________________________________________   LABS (all labs ordered are listed, but only abnormal results are displayed)  Labs Reviewed  COMPREHENSIVE METABOLIC PANEL - Abnormal; Notable for the following components:      Result Value   Glucose, Bld 236 (*)    Calcium 8.8 (*)    AST 49 (*)    ALT 69 (*)    All other components within normal limits  CBC WITH DIFFERENTIAL/PLATELET - Abnormal; Notable for the following components:   Abs Immature Granulocytes 0.18 (*)    All other components within normal limits  BRAIN NATRIURETIC PEPTIDE  TROPONIN I  INFLUENZA PANEL BY PCR (TYPE A & B)  MAGNESIUM  PHOSPHORUS  CALCIUM, IONIZED  APTT  PROTIME-INR  HEPARIN LEVEL (UNFRACTIONATED)  LIPID PANEL  TROPONIN I  TROPONIN I    Lab work reviewed by me with a first troponin which is negative __________________________________________  EKG  ED ECG REPORT I, Darel Hong, the attending physician, personally viewed and interpreted this ECG.  Date: 01/07/2018 EKG Time:  Rate: 79 Rhythm: normal sinus rhythm QRS Axis: normal Intervals: normal ST/T Wave abnormalities: normal Narrative Interpretation: no evidence of acute  ischemia  ____________________________________________  RADIOLOGY  Chest x-ray reviewed by me with no acute disease ____________________________________________   PROCEDURES  Procedure(s) performed: yes  Angiocath insertion Performed by: Darel Hong  Consent: Verbal consent obtained. Risks and benefits: risks, benefits and alternatives were discussed Time out: Immediately prior to procedure a "time out" was called to verify the correct patient, procedure, equipment, support staff and site/side marked as required.  Preparation: Patient was prepped and draped in the usual sterile fashion.  Vein Location: right forearm  Gauge: 18  Normal blood return and flush without difficulty Patient tolerance: Patient tolerated the procedure well with no immediate complications.     .Critical Care Performed by: Darel Hong, MD Authorized by: Darel Hong, MD   Critical care provider statement:    Critical care time (minutes):  30   Critical care time was exclusive of:  Separately billable procedures and treating other patients   Critical care was necessary to treat or prevent imminent or life-threatening deterioration of the following conditions:  Cardiac failure   Critical care was time spent personally by me on the following activities:  Development of treatment plan with patient or surrogate, discussions with consultants, evaluation of patient's response to treatment, examination of patient, obtaining history from patient or surrogate, ordering and performing treatments and interventions, ordering and review of laboratory studies, ordering and review of radiographic studies, pulse oximetry, re-evaluation of patient's condition and review of old charts    Critical Care performed: Yes  ____________________________________________   INITIAL IMPRESSION / ASSESSMENT AND PLAN / ED COURSE  Pertinent labs & imaging results that were available during my care of the patient  were reviewed by me and considered in my medical decision making (see chart for details).   As part of my medical decision making, I reviewed the following data within the [place] History obtained from family if available, nursing notes, old chart and ekg, as well as notes from prior ED visits.  The patient arrives with a very concerning story and appears unwell.  He is slightly hypertensive although equal in both arms.  His first EKG is nonischemic.  Given a total of 3 sublingual nitroglycerin each of which improved his symptoms although after the third his symptoms persisted so I have begun him on a triglyceride infusion.  I reviewed the patient's cardiac catheterization from 2018 which shows 30% stenosis in his LAD along with a normal ejection fraction.  At that time he had only single-vessel disease.  His pain was not sudden onset not ripping or tearing and I doubt aortic dissection.  Likewise the sounds considerably more anginal than pulmonary embolism and I do not think he has a clot.  Given aspirin here and I have discussed with the hospitalist regarding admission.  We will also place him on a heparin infusion.  The patient and his family verbalized understanding agreement with the plan.  Chest x-ray with no infiltrates however as the patient did have a fever to 102 degrees about a week ago I will check an influenza as well.      ____________________________________________   FINAL CLINICAL IMPRESSION(S) / ED DIAGNOSES  Final diagnoses:  Unstable angina (HCC)      NEW MEDICATIONS STARTED DURING THIS VISIT:  New Prescriptions   No medications on file     Note:  This document was prepared using Dragon voice recognition software and may include unintentional dictation errors.     Darel Hong, MD 01/07/18 [phone]

## 2018-01-07 NOTE — ED Notes (Signed)
Pt O2 sat remaining at approx 92% and below. Pt placed on O2 2L via nasal cannula and sat now at 96%

## 2018-01-07 NOTE — Progress Notes (Signed)
Per Hinton Dyer Np patient can go to CT without Nursing. Ticket to ride printed.

## 2018-01-07 NOTE — ED Notes (Signed)
Responded to call bell. Pt reported that he had a sharp shooting pain down his L arm that caused his hand to "disfigure". Pts mother who was sitting at bedside stated that she saw his hand contract in a claw like motion. Sx lasted several seconds before subsiding. Vitals stable, cap refill <3 sec, pulses intact.

## 2018-01-07 NOTE — Progress Notes (Signed)
[place] at [facility]                                                                                                                                                                                  Patient Demographics   Gerald [name], is a 52 y.o. male, DOB - 10-24-1965, VHQ:[id number]  Admit date - 01/07/2018   Admitting Physician Dustin Flock, MD  Outpatient Primary MD for the patient is Ronnell Freshwater, NP   LOS - 0  Subjective: Patient admitted with chest pain, continues to complain of chest pain.  Also complains of pain going down his left arm    Review of Systems:   CONSTITUTIONAL: No documented fever. No fatigue, weakness. No weight gain, no weight loss.  EYES: No blurry or double vision.  ENT: No tinnitus. No postnasal drip. No redness of the oropharynx.  RESPIRATORY: No cough, no wheeze, no hemoptysis. No dyspnea.  CARDIOVASCULAR: Positive chest pain. No orthopnea. No palpitations. No syncope.  GASTROINTESTINAL: No nausea, no vomiting or diarrhea. No abdominal pain. No melena or hematochezia.  GENITOURINARY: No dysuria or hematuria.  ENDOCRINE: No polyuria or nocturia. No heat or cold intolerance.  HEMATOLOGY: No anemia. No bruising. No bleeding.  INTEGUMENTARY: No rashes. No lesions.  MUSCULOSKELETAL: No arthritis. No swelling. No gout.  NEUROLOGIC: No numbness, tingling, or ataxia. No seizure-type activity.  PSYCHIATRIC: No anxiety. No insomnia. No ADD.    Vitals:   Vitals:   01/07/18 1215 01/07/18 1230 01/07/18 1245 01/07/18 1324  BP: 116/67 117/71 122/77 134/81  Pulse: 75 73 76 78  Resp: 18 16 19 18   Temp:    98.3 F (36.8 C)  TempSrc:      SpO2: 96% 95% 96% 95%  Weight:      Height:        Wt Readings from Last 3 Encounters:  01/07/18 (!) 149.7 kg  11/20/17 (!) 152 kg  10/12/17 (!) 152 kg     Intake/Output Summary (Last 24 hours) at 01/07/2018 1430 Last data filed at 01/07/2018 1137 Gross per 24 hour  Intake  40.59 ml  Output -  Net 40.59 ml    Physical Exam:   GENERAL: Pleasant-appearing in no apparent distress.  HEAD, EYES, EARS, NOSE AND THROAT: Atraumatic, normocephalic. Extraocular muscles are intact. Pupils equal and reactive to light. Sclerae anicteric. No conjunctival injection. No oro-pharyngeal erythema.  NECK: Supple. There is no jugular venous distention. No bruits, no lymphadenopathy, no thyromegaly.  HEART: Regular rate and rhythm,. No murmurs, no rubs, no clicks.  LUNGS: Clear to auscultation bilaterally. No rales or rhonchi. No wheezes.  ABDOMEN: Soft, flat, nontender, nondistended. Has good bowel sounds.  No hepatosplenomegaly appreciated.  EXTREMITIES: No evidence of any cyanosis, clubbing, or peripheral edema.  +2 pedal and radial pulses bilaterally.  NEUROLOGIC: The patient is alert, awake, and oriented x3 with no focal motor or sensory deficits appreciated bilaterally.  SKIN: Moist and warm with no rashes appreciated.  Psych: Not anxious, depressed LN: No inguinal LN enlargement    Antibiotics   Anti-infectives (From admission, onward)   None      Medications   Scheduled Meds: . [START ON 01/08/2018] aspirin EC  81 mg Oral Daily  . carvedilol  3.125 mg Oral BID WC  . citalopram  40 mg Oral Daily  . lisinopril  20 mg Oral Daily   And  . hydrochlorothiazide  25 mg Oral Daily  . insulin aspart  0-15 Units Subcutaneous TID WC  . insulin aspart  0-5 Units Subcutaneous QHS  . insulin glargine  35 Units Subcutaneous QHS  . pantoprazole  40 mg Oral BID  . rosuvastatin  5 mg Oral q1800   Continuous Infusions: . heparin 1,400 Units/hr (01/07/18 0739)  . nitroGLYCERIN 30 mcg/min (01/07/18 1128)   PRN Meds:.acetaminophen, albuterol, bisacodyl, morphine injection **OR** morphine injection, nitroGLYCERIN, ondansetron (ZOFRAN) IV, senna-docusate   Data Review:   Micro Results No results found for this or any previous visit (from the past 240 hour(s)).  Radiology  Reports Ct Angio Chest Pe W Or Wo Contrast  Result Date: 01/07/2018 CLINICAL DATA:  Chest pain and shortness of breath this morning. EXAM: CT ANGIOGRAPHY CHEST WITH CONTRAST TECHNIQUE: Multidetector CT imaging of the chest was performed using the standard protocol during bolus administration of intravenous contrast. Multiplanar CT image reconstructions and MIPs were obtained to evaluate the vascular anatomy. CONTRAST:  24mL OMNIPAQUE IOHEXOL 350 MG/ML SOLN COMPARISON:  05/22/2014 FINDINGS: Cardiovascular: The heart is mildly enlarged but stable. No pericardial effusion. There is mild tortuosity of the thoracic aorta but no aneurysm or dissection. Scattered atherosclerotic calcifications. The branch vessels are patent. No obvious coronary artery calcifications. Mild pulmonary artery enlargement suggesting pulmonary hypertension. The pulmonary arterial tree is fairly well opacified and no filling defects are identified to suggest pulmonary embolism. Mediastinum/Nodes: Small scattered mediastinal and hilar lymph nodes but no mass or adenopathy. The esophagus is grossly normal. Lungs/Pleura: No acute pulmonary findings. No infiltrates, edema or effusions. No pulmonary lesions. No bronchiectasis or interstitial lung disease. Upper Abdomen: No significant upper abdominal findings. Musculoskeletal: Stable symmetric bilateral gynecomastia. No supraclavicular or axillary adenopathy. The bony thorax is intact. No bone lesions. Review of the MIP images confirms the above findings. IMPRESSION: 1. No CT findings for pulmonary embolism. 2. Mildly enlarged pulmonary artery suggesting pulmonary hypertension. 3. No acute pulmonary findings. 4. Stable bilateral gynecomastia. Aortic Atherosclerosis (ICD10-I70.0). Electronically Signed   By: Marijo Sanes M.D.   On: 01/07/2018 14:19   Dg Chest Port 1 View  Result Date: 01/07/2018 CLINICAL DATA:  52 year old male with shortness of breath and chest pain. EXAM: PORTABLE CHEST 1  VIEW COMPARISON:  Chest radiograph dated 07/17/2017 FINDINGS: There is mild cardiomegaly. No vascular congestion or edema. No focal consolidation, pleural effusion, or pneumothorax. No acute osseous pathology. IMPRESSION: 1. No acute cardiopulmonary process. 2. Mild cardiomegaly. Electronically Signed   By: Anner Crete M.D.   On: 01/07/2018 06:36     CBC Recent Labs  Lab 01/07/18 0554  WBC 9.0  HGB 13.8  HCT 40.7  PLT 271  MCV 85.1  MCH 28.9  MCHC 33.9  RDW 13.4  LYMPHSABS 3.4  MONOABS 0.6  EOSABS 0.2  BASOSABS 0.1    Chemistries  Recent Labs  Lab 01/07/18 0554  NA 137  K 3.6  CL 100  CO2 30  GLUCOSE 236*  BUN 15  CREATININE 0.79  CALCIUM 8.8*  MG 1.8  AST 49*  ALT 69*  ALKPHOS 82  BILITOT 0.6   ------------------------------------------------------------------------------------------------------------------ estimated creatinine clearance is 168.7 mL/min (by C-G formula based on SCr of 0.79 mg/dL). ------------------------------------------------------------------------------------------------------------------ No results for input(s): HGBA1C in the last 72 hours. ------------------------------------------------------------------------------------------------------------------ Recent Labs    01/07/18 0554  CHOL 219*  HDL 33*  LDLCALC 138*  TRIG 240*  CHOLHDL 6.6   ------------------------------------------------------------------------------------------------------------------ No results for input(s): TSH, T4TOTAL, T3FREE, THYROIDAB in the last 72 hours.  Invalid input(s): FREET3 ------------------------------------------------------------------------------------------------------------------ No results for input(s): VITAMINB12, FOLATE, FERRITIN, TIBC, IRON, RETICCTPCT in the last 72 hours.  Coagulation profile Recent Labs  Lab 01/07/18 0730  INR 1.05    No results for input(s): DDIMER in the last 72 hours.  Cardiac Enzymes Recent Labs  Lab  01/07/18 0554 01/07/18 1035 01/07/18 1331  TROPONINI <0.03 <0.03 <0.03   ------------------------------------------------------------------------------------------------------------------ Invalid input(s): POCBNP    Assessment & Plan   Patient is 52 year old with chest pain  1.  Chest pain appears to be atypical I ordered a CT of the chest per PE which is negative Cardiology is going to come evaluate the patient Continue IV heparin and IV nitroglycerin for now  2.  Diabetes type 2 continue Levemir We will place on sliding scale insulin  3.  Hypertension we will obtain patient's home regimen resume once available  4.  GERD continue Protonix      Code Status Orders  (From admission, onward)         Start     Ordered   01/07/18 1322  Full code  Continuous     01/07/18 1321        Code Status History    Date Active Date Inactive Code Status Order ID Comments User Context   06/10/2017 0805 06/11/2017 1621 Full Code [id number]  Harrie Foreman, MD Inpatient   06/11/2016 1104 06/11/2016 1828 Full Code [id number]  Minna Merritts, MD Inpatient   06/08/2016 1522 06/11/2016 1104 Full Code [id number]  Baxter Hire, MD Inpatient           Consults  cards  DVT Prophylaxis  heparin  Lab Results  Component Value Date   PLT 271 01/07/2018     Time Spent in minutes  73min 145pm ro 230pm  Greater than 50% of time spent in care coordination and counseling patient regarding the condition and plan of care.   Dustin Flock M.D on 01/07/2018 at 2:30 PM  Between 7am to 6pm - Pager - [phone]  After 6pm go to [email or web address] - Proofreader  [employer]   Office  [phone]

## 2018-01-07 NOTE — Progress Notes (Signed)
Patient alert and oriented. Patient does state that he has dull/sharp pain to his chest and left arm/leg. Morphine given per orders. Per Dr Rockey Situ discontinued heparin and nitro drip. Troponin and CT negative at this point. Using urinal and diet ordered. Per Dr Rockey Situ place orders for patient to go to 2A. Report given to Amy.

## 2018-01-07 NOTE — Consult Note (Signed)
Cardiology Consultation:   Patient ID: Gerald [name] MRN: [id number]; DOB: 04/03/1965  Admit date: 01/07/2018 Date of Consult: 01/07/2018  Primary Care Provider: Ronnell Freshwater, NP Primary Cardiologist: Dr. Rockey Situ Primary Electrophysiologist:  None    Patient Profile:   Gerald [name] is a 52 y.o. male with a hx of nonobstructive CAD with 30% mid LAD stenosis (LHC, 2013, 2016, 2018), NICM with normal LVEF, DM2, HTN, HLD, OSA on CPAP, hepatic steatosis, GERD, and morbid obesity who is being seen today for the evaluation of chest pain at the request of Dr. Posey Pronto.  History of Present Illness:   Gerald [name] is a 52 yo male with PMH as above and history of annual cardiac catheterizations.  Previous cardiac work-up including stress testing and cardiac catheterization 11/22/2016 showed nonobstructive disease with 30 to 40% proximal to mid LAD, unchanged from previous studies.  06/2017: Patient presented with chest pain and associated nausea and bloody emesis, as well as chronic dry cough for the past month.  He reported at the time that he had been started on Levaquin per PCP but was not getting any better.  He reported 3 episodes of emesis, stating the last episode was black in color.  On presentation to the emergency room, initial cardiac enzymes were found negative with no acute ST elevation.  He reported no alleviation with nitroglycerin, moderate relief of chest pain with fentanyl.  On examination, he reported main complaint of pain in left lower abdominal quadrant and considered more concerning for upper GI bleed.  EGD with nonbleeding ulcers. At discharge, recommendation was for no further cardiac work-up as chest pain was seen likely not cardiac in nature and suspicion documented as high for MSK etiology of CP as well.  Recommendation was for medical management with ASA 81 mg daily and aggressive statin therapy for goal LDL below 70.  07/2017: Patient presented to [facility] ED via EMS from  home with left arm and left leg weakness for 3 days.  He complained of associated headache with fevers, chills, dry cough for approximately 1 month.  He reported the numbness was relatively abrupt in onset and associated with progressive difficult ambulation.  He also reported increased amount of stress at that time.  At presentation, he underwent extensive work-up for left-sided weakness as well as cough.  Chest x-ray negative.  Labs without significant abnormalities.  Patient's head CT negative.  MRI brain negative.  Dr. Leonel Ramsay of neurology was consulted with recommendation for MRI of head and neck as well as MRI of cervical spine.  Further documentation per neurology indicated that ideology likely psychogenic given imaging without acute changes or indications for neurologic causes.  It was recommended that he follow-up with neurology for further work-up of his weakness as an outpatient.  01/07/2018: Patient presented to [facility] ED with reported chest pressure and sharp pain that began earlier the previous night. The chest pain reportedly was sharp in nature and radiated down his left arm without any alleviating factors. 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  Associated symptoms included SOB, DOE, dry cough, diaphoresis, weakness, and left leg pain. He stated that coughing and trying to breathe in the cold air made the chest pain worse, gradually progressing to 10/10 in severity.  He reported this chest pain radiated down the left arm and eventually resulted in left arm numbness that was improved by time of presentation to the ED.  He also noticed his left leg pain was  associated with numbness and swelling. He reportedly fought through these symptoms while at work; however, when heading home, he became short of breath while walking across the parking lot to his car. He rested without much alleviation in his SOB. While driving home, and around 3 AM, he reported the sudden onset  of chest pressure, "as if an elephant was sitting on my chest."  He stated that he pulled over and had emesis x1. He reported he then drove himself home, "but he does not know how" but denied any loss of consciousness.  In the ED 01/07/18 Vitals: HR 76, RR 17, BP 1 63-89, T 90 8.30F, SPO2 99% Labs: NA 137, K3.6, glucose 236, Cr 0.79, BUN 15, alk phosphatase 82, albumin 3.9, AST 49, ALT 69, BNP 27, troponin negative, total cholesterol 219, HDL 33, LDL 138, triglycerides 240, WBC 9.0, RBC 4.78, hemoglobin 13.8, hematocrit 40.7, platelets 271, influenza A/B negative EKG: NSR, HR 79 bpm, no acute ST elevation. IVCD CXR: No acute cardiopulmonary process, mild cardiomegaly. CT NGO chest: Mildly enlarged heart.  No pericardial effusion.  Mild tortuosity of the thoracic aorta but no aneurysm or dissection.  Scattered atherosclerosis calcifications.  Patent branch vessels.  No obvious coronary artery calcifications.  Mild pulmonary artery enlargement suggesting pulmonary hypertension.  Pulmonary arterial tree fairly well opacified no filling defects identified to suggest pulmonary embolism. Meds: Only slight relief of chest pain following 3 sublingual nitroglycerin.  Initiation of IV nitroglycerin.  ASA administered.  Heparin drip started. He was also placed on [place] O2 2L after O2 sat at 92% and below per documentation.  He was admitted to the ICU with [facility] cardiology consulted for further evaluation and workup. While still in the ED, patient continued to c/o pain with Dr. Rockey Situ contacted for further recommendations. Per Dr. Rockey Situ, GI cocktail was administered at that time given patient history of negative cardiac workup and negative troponin and EKG in ED.  On exam today, patient reported no previous history of smoking, no current drug use, and occasional 1-2 alcoholic beverages per month. Patient reported compliance with CPAP.  He denied any changes to his medications.  No recent surgery, travel, or  immobilization.  He does not have history of DVT or pulmonary embolism, and imaging upon presentation to [facility] ED negative for pulmonary embolism.  Of note, he reportedly did have a fever of up to 102 degrees approximately 5 days prior to presentation and associated with dry cough for approximately 14 days.   Past Medical History:  Diagnosis Date  . Abnormal LFTs   . Coronary artery disease    Mild nonobstructive coronary artery disease on previous cardiac catheterization most recently in August of 2013  . Diabetes mellitus without complication (HCC)    borderline  . Fundic gland polyps of stomach, benign   . Gastritis   . GERD (gastroesophageal reflux disease)   . Hepatic steatosis   . Hyperlipidemia   . Hypertension   . Myocardial infarction ([place]) 09/2011  . Nonischemic cardiomyopathy (HCC)    Previous ejection fraction of 35-40% on echo. 50% on cardiac catheterization in August of 2013  . Obesity   . Sleep apnea     Past Surgical History:  Procedure Laterality Date  . CARDIAC CATHETERIZATION N/A 09/2011   [facility]; EF 50% with 30% mid LAD stenosis and no obstructive disease.  Marland Kitchen CARDIAC CATHETERIZATION  09/2010   [facility]; Mid LAD 40% stenosis; Mid Circumflex:Normal; Mid RCA; Normal  . CARDIAC CATHETERIZATION    . COLONOSCOPY    .  ESOPHAGOGASTRODUODENOSCOPY (EGD) WITH PROPOFOL N/A 06/10/2017   Procedure: ESOPHAGOGASTRODUODENOSCOPY (EGD) WITH PROPOFOL;  Surgeon: Jonathon Bellows, MD;  Location: [facility] ENDOSCOPY;  Service: Gastroenterology;  Laterality: N/A;  . KNEE ARTHROSCOPY WITH MEDIAL MENISECTOMY Right 09/16/2012   Procedure: RIGHT KNEE ARTHROSCOPY WITH MEDIAL AND LATERAL MENISECTOMY, CHONDROPLASTY;  Surgeon: Ninetta Lights, MD;  Location: [place];  Service: Orthopedics;  Laterality: Right;  RIGHT KNEE SCOPE MEDIAL MENISCECTOMY  . LEFT HEART CATH AND CORONARY ANGIOGRAPHY N/A 06/11/2016   Procedure: Left Heart Cath and Coronary Angiography;  Surgeon: Minna Merritts, MD;   Location: [place] CV LAB;  Service: Cardiovascular;  Laterality: N/A;     Home Medications:  Prior to Admission medications   Medication Sig Start Date End Date Taking? Authorizing Provider  acetaminophen (TYLENOL) 325 MG tablet Take 325-650 mg by mouth every 6 (six) hours as needed for mild pain or headache.   Yes [provider]  albuterol (PROVENTIL HFA;VENTOLIN HFA) 108 (90 Base) MCG/ACT inhaler Inhale 2 puffs into the lungs every 6 (six) hours as needed for wheezing or shortness of breath. 02/25/17  Yes Nance Pear, MD  citalopram (CELEXA) 40 MG tablet TAKE 1 TABLET BY MOUTH EVERY DAY FOR GAD 07/30/17  Yes Ronnell Freshwater, NP  Dulaglutide (TRULICITY) 1.5 NF/6.2ZH SOPN Inject 1.5 mg into the skin every Wednesday. 08/05/17  Yes Boscia, Greer Ee, NP  Insulin Detemir (LEVEMIR FLEXTOUCH) 100 UNIT/ML Pen Inject 50units [place] daily. Patient taking differently: Inject 50 Units into the skin every evening. Inject 50units [place] daily. 11/05/17  Yes Boscia, Greer Ee, NP  lisinopril-hydrochlorothiazide (PRINZIDE,ZESTORETIC) 20-25 MG tablet Take 1 tablet by mouth daily. 07/30/17  Yes Ronnell Freshwater, NP  amoxicillin-clavulanate (AUGMENTIN) 875-125 MG tablet Take 1 tablet by mouth 2 (two) times daily. Patient not taking: Reported on 01/07/2018 11/20/17   Kendell Bane, NP  chlorpheniramine-HYDROcodone [facility] PENNKINETIC ER) 10-8 MG/5ML SUER Take 5 mLs by mouth every 12 (twelve) hours as needed for cough. Patient not taking: Reported on 01/07/2018 11/20/17   Kendell Bane, NP  pantoprazole (PROTONIX) 40 MG tablet Take 1 tablet (40 mg total) by mouth 2 (two) times daily before a meal. Patient not taking: Reported on 01/07/2018 09/30/17   Ronnell Freshwater, NP    Inpatient Medications: Scheduled Meds: . [START ON 01/08/2018] aspirin EC  81 mg Oral Daily  . carvedilol  3.125 mg Oral BID WC  . citalopram  40 mg Oral Daily  . lisinopril  20 mg Oral Daily   And  . hydrochlorothiazide   25 mg Oral Daily  . insulin aspart  0-15 Units Subcutaneous TID WC  . insulin aspart  0-5 Units Subcutaneous QHS  . insulin glargine  35 Units Subcutaneous QHS  . pantoprazole  40 mg Oral BID  . rosuvastatin  5 mg Oral q1800   Continuous Infusions: . heparin 1,400 Units/hr (01/07/18 0739)  . nitroGLYCERIN 30 mcg/min (01/07/18 1128)   PRN Meds: acetaminophen, albuterol, bisacodyl, morphine injection **OR** morphine injection, nitroGLYCERIN, ondansetron (ZOFRAN) IV, senna-docusate  Allergies:    Allergies  Allergen Reactions  . Statins Rash         Social History:   Social History   Socioeconomic History  . Marital status: Married    Spouse name: Not on file  . Number of children: 3  . Years of education: Not on file  . Highest education level: Not on file  Occupational History  . Occupation: Librarian, academic  Social Needs  . Financial  resource strain: Not on file  . Food insecurity:    Worry: Not on file    Inability: Not on file  . Transportation needs:    Medical: Not on file    Non-medical: Not on file  Tobacco Use  . Smoking status: Never Smoker  . Smokeless tobacco: Never Used  Substance and Sexual Activity  . Alcohol use: Not Currently    Alcohol/week: 0.0 standard drinks    Comment: rare  . Drug use: No  . Sexual activity: Not on file  Lifestyle  . Physical activity:    Days per week: Not on file    Minutes per session: Not on file  . Stress: Not on file  Relationships  . Social connections:    Talks on phone: Not on file    Gets together: Not on file    Attends religious service: Not on file    Active member of club or organization: Not on file    Attends meetings of clubs or organizations: Not on file    Relationship status: Not on file  . Intimate partner violence:    Fear of current or ex partner: Not on file    Emotionally abused: Not on file    Physically abused: Not on file    Forced sexual activity: Not on file  Other Topics Concern    . Not on file  Social History Narrative   Married.  37 yo son.   Wife on disability for bipolar disorder.      Family History:    Family History  Problem Relation Age of Onset  . Heart disease Father 32       MI  . Heart attack Father   . Stomach cancer Father   . Hypertension Mother   . Breast cancer Mother      ROS:  Please see the history of present illness.  Review of Systems  Constitutional: Positive for diaphoresis and malaise/fatigue. Negative for weight loss.  Respiratory: Positive for cough and shortness of breath.   Cardiovascular: Positive for chest pain, orthopnea and leg swelling. Negative for palpitations.  Gastrointestinal: Positive for nausea and vomiting.  Musculoskeletal: Positive for myalgias. Negative for back pain and falls.  Neurological: Positive for dizziness, tingling, focal weakness and weakness. Negative for loss of consciousness.    All other ROS reviewed and negative.     Physical Exam/Data:   Vitals:   01/07/18 1215 01/07/18 1230 01/07/18 1245 01/07/18 1324  BP: 116/67 117/71 122/77 134/81  Pulse: 75 73 76 78  Resp: 18 16 19 18   Temp:    98.3 F (36.8 C)  TempSrc:      SpO2: 96% 95% 96% 95%  Weight:      Height:        Intake/Output Summary (Last 24 hours) at 01/07/2018 1424 Last data filed at 01/07/2018 1137 Gross per 24 hour  Intake 40.59 ml  Output -  Net 40.59 ml   Filed Weights   01/07/18 0543  Weight: (!) 149.7 kg   Body mass index is 42.37 kg/m.  General:  Morbidly obese male joined by his mother HEENT: normal Neck: JVD difficult to assess d/t body habitus Endocrine:  No thryomegaly Vascular: No carotid bruits; FA pulses 2+ bilaterally without bruits  Cardiac:  normal S1, S2; RRR; no murmur  Lungs:  Breath sounds distant but otherwise clear to auscultation bilaterally, no, rhonchi or rales; some wheezing Abd: somewhat firm and distended but not TTP Ext: 1-2+ LEE bilaterally  Musculoskeletal:  No deformities, BUE and  BLE strength normal and equal but patient reporting earlier L sided weakness (patient report) Skin: warm and dry  Neuro:  no focal abnormalities noted Psych:  Normal affect   EKG: Refer to HPI Telemetry:  Telemetry was personally reviewed and demonstrates:  IVCD with rates in the 70s  CV Studies:   Relevant CV Studies: Pending updated TTE  06/11/2016 LHC and coronary angiography  Prox LAD lesion, 30 %stenosed.  The left ventricular ejection fraction is 55-65% by visual estimate.  LV end diastolic pressure is normal.  The left ventricular systolic function is normal.  There is no aortic valve stenosis. There is no aortic valve regurgitation.  There is no mitral valve stenosis, no mitral valve regurgitation and no mitral valve prolapse evident.   06/10/2016 TTE Study Conclusions - Procedure narrative: Transthoracic echocardiography. The study   was technically difficult. - Left ventricle: The cavity size was normal. Systolic function was   normal. The estimated ejection fraction was in the range of 55%   to 60%. Regional wall motion abnormalities cannot be excluded.   Doppler parameters are consistent with abnormal left ventricular   relaxation (grade 1 diastolic dysfunction). - Left atrium: The atrium was normal in size. - Right ventricle: Systolic function was normal. - Pulmonary arteries: Systolic pressure could not be accurately   estimated. Impressions: - Challenging image quality. Laboratory Data:  Chemistry Recent Labs  Lab 01/07/18 0554  NA 137  K 3.6  CL 100  CO2 30  GLUCOSE 236*  BUN 15  CREATININE 0.79  CALCIUM 8.8*  GFRNONAA >60  GFRAA >60  ANIONGAP 7    Recent Labs  Lab 01/07/18 0554  PROT 7.2  ALBUMIN 3.9  AST 49*  ALT 69*  ALKPHOS 82  BILITOT 0.6   Hematology Recent Labs  Lab 01/07/18 0554  WBC 9.0  RBC 4.78  HGB 13.8  HCT 40.7  MCV 85.1  MCH 28.9  MCHC 33.9  RDW 13.4  PLT 271   Cardiac Enzymes Recent Labs  Lab  01/07/18 0554 01/07/18 1035 01/07/18 1331  TROPONINI <0.03 <0.03 <0.03   No results for input(s): TROPIPOC in the last 168 hours.  BNP Recent Labs  Lab 01/07/18 0554  BNP 27.0    DDimer No results for input(s): DDIMER in the last 168 hours.  Radiology/Studies:  Ct Angio Chest Pe W Or Wo Contrast  Result Date: 01/07/2018 CLINICAL DATA:  Chest pain and shortness of breath this morning. EXAM: CT ANGIOGRAPHY CHEST WITH CONTRAST TECHNIQUE: Multidetector CT imaging of the chest was performed using the standard protocol during bolus administration of intravenous contrast. Multiplanar CT image reconstructions and MIPs were obtained to evaluate the vascular anatomy. CONTRAST:  21m OMNIPAQUE IOHEXOL 350 MG/ML SOLN COMPARISON:  05/22/2014 FINDINGS: Cardiovascular: The heart is mildly enlarged but stable. No pericardial effusion. There is mild tortuosity of the thoracic aorta but no aneurysm or dissection. Scattered atherosclerotic calcifications. The branch vessels are patent. No obvious coronary artery calcifications. Mild pulmonary artery enlargement suggesting pulmonary hypertension. The pulmonary arterial tree is fairly well opacified and no filling defects are identified to suggest pulmonary embolism. Mediastinum/Nodes: Small scattered mediastinal and hilar lymph nodes but no mass or adenopathy. The esophagus is grossly normal. Lungs/Pleura: No acute pulmonary findings. No infiltrates, edema or effusions. No pulmonary lesions. No bronchiectasis or interstitial lung disease. Upper Abdomen: No significant upper abdominal findings. Musculoskeletal: Stable symmetric bilateral gynecomastia. No supraclavicular or axillary adenopathy. The bony thorax is intact. No  bone lesions. Review of the MIP images confirms the above findings. IMPRESSION: 1. No CT findings for pulmonary embolism. 2. Mildly enlarged pulmonary artery suggesting pulmonary hypertension. 3. No acute pulmonary findings. 4. Stable bilateral  gynecomastia. Aortic Atherosclerosis (ICD10-I70.0). Electronically Signed   By: Marijo Sanes M.D.   On: 01/07/2018 14:19   Dg Chest Port 1 View  Result Date: 01/07/2018 CLINICAL DATA:  52 year old male with shortness of breath and chest pain. EXAM: PORTABLE CHEST 1 VIEW COMPARISON:  Chest radiograph dated 07/17/2017 FINDINGS: There is mild cardiomegaly. No vascular congestion or edema. No focal consolidation, pleural effusion, or pneumothorax. No acute osseous pathology. IMPRESSION: 1. No acute cardiopulmonary process. 2. Mild cardiomegaly. Electronically Signed   By: Anner Crete M.D.   On: 01/07/2018 06:36    Assessment and Plan:   Atypical Chest Pain  - Reporting current CP with SOB, chronic cough, and L sided pain and numbness. Atypical chest pain story. 06/2017 admission with similar presentation of chest pain and suggestive of MSK etiology. Patient reports numbness and trouble with motor function that also suggest MSK / neurologic component. Patient reports chronic ongoing dry cough, which could contribute to MSK etiology. He reported pleuritic CP, worsening with cool air. Also considered is patient report that he was ill with fever less than 1 week ago, which might also contribute to associated SOB and diaphoresis. Recent admission with gastritis and finding of non-bleeding GI ulcers that may also explain chronic cough and chest pain, as both may be related to a GERD/ reflux etiology. - CT imaging as above without pericardial effusion or signs of pulmonary embolism.  Mild tortuosity of the thoracic aorta but no aneurysm or dissection.  Scattered atherosclerosis calcifications.  Patent branch vessels.  No obvious coronary artery calcifications.  Mild pulmonary artery enlargement suggesting pulmonary hypertension.  - Pending updated TTE. LEE on exam but otherwise does not appear volume overloaded as indicated by BNP of 27.0 - Negative troponin x3, EKG without STE. Low concern for cardiac  etiology given h/o previous cardiac workups including stress testing and cardiac catheterization. R/o for ACS. Discontinued heparin and recommend initiate DVT prophylaxis as no recommendation for invasive ischemic workup including cardiac cathereization at this time. Discontinued nitro. Continue morphine and tylenol as needed for pain. No need for BB, continued ASA at this time.  Emesis - As above, recent admission with nonbleeding ulcers - Per GI, IM  OSA on CPAP - Reported compliance - Per IM  DM2 - SSI - Per IM       For questions or updates, please contact [place] Please consult [email or web address] for contact info under     Signed, Arvil Chaco, PA-C  01/07/2018 2:24 PM

## 2018-01-07 NOTE — ED Triage Notes (Signed)
Pt to triage via w/c, appears uncomfortable; Pt reports left sided CP radiating down left arm accomp by diaphoresis since 3am with some SHOB; st hx MI; also st has had recent nonprod cough

## 2018-01-07 NOTE — ED Notes (Signed)
ED TO INPATIENT HANDOFF REPORT  Name/Age/Gender Gerald [name] 52 y.o. male  Code Status Code Status History    Date Active Date Inactive Code Status Order ID Comments User Context   06/10/2017 0805 06/11/2017 1621 Full Code [id number]  Harrie Foreman, MD Inpatient   06/11/2016 1104 06/11/2016 1828 Full Code [id number]  Minna Merritts, MD Inpatient   06/08/2016 1522 06/11/2016 1104 Full Code [id number]  Baxter Hire, MD Inpatient      Home/SNF/Other Home  Chief Complaint Chest Pain, pain going left Arm and numbness in left knee  Level of Care/Admitting Diagnosis ED Disposition    ED Disposition Condition [facility]: [place] [[id number]]  Level of Care: Stepdown [14]  Diagnosis: Chest pain [[id number]]  Admitting Physician: Arta Silence [[id number]]  Attending Physician: Arta Silence [[id number]]  Estimated length of stay: past midnight tomorrow  Certification:: I certify this patient will need inpatient services for at least 2 midnights  PT Class (Do Not Modify): Inpatient [101]  PT Acc Code (Do Not Modify): Private [1]       Medical History Past Medical History:  Diagnosis Date  . Abnormal LFTs   . Coronary artery disease    Mild nonobstructive coronary artery disease on previous cardiac catheterization most recently in August of 2013  . Diabetes mellitus without complication (HCC)    borderline  . Fundic gland polyps of stomach, benign   . Gastritis   . GERD (gastroesophageal reflux disease)   . Hepatic steatosis   . Hyperlipidemia   . Hypertension   . Myocardial infarction ([place]) 09/2011  . Nonischemic cardiomyopathy (HCC)    Previous ejection fraction of 35-40% on echo. 50% on cardiac catheterization in August of 2013  . Obesity   . Sleep apnea     Allergies Allergies  Allergen Reactions  . Statins Rash         IV Location/Drains/Wounds Patient Lines/Drains/Airways Status   Active Line/Drains/Airways     Name:   Placement date:   Placement time:   Site:   Days:   Peripheral IV 01/07/18 Left Antecubital   01/07/18    0557    Antecubital   less than 1   Peripheral IV 01/07/18 Right Forearm   01/07/18    0614    Forearm   less than 1          Labs/Imaging Results for orders placed or performed during the hospital encounter of 01/07/18 (from the past 48 hour(s))  Comprehensive metabolic panel     Status: Abnormal   Collection Time: 01/07/18  5:54 AM  Result Value Ref Range   Sodium 137 135 - 145 mmol/L   Potassium 3.6 3.5 - 5.1 mmol/L   Chloride 100 98 - 111 mmol/L   CO2 30 22 - 32 mmol/L   Glucose, Bld 236 (H) 70 - 99 mg/dL   BUN 15 6 - 20 mg/dL   Creatinine, Ser 0.79 0.61 - 1.24 mg/dL   Calcium 8.8 (L) 8.9 - 10.3 mg/dL   Total Protein 7.2 6.5 - 8.1 g/dL   Albumin 3.9 3.5 - 5.0 g/dL   AST 49 (H) 15 - 41 U/L   ALT 69 (H) 0 - 44 U/L   Alkaline Phosphatase 82 38 - 126 U/L   Total Bilirubin 0.6 0.3 - 1.2 mg/dL   GFR calc non Af Amer >60 >60 mL/min   GFR calc Af Amer >60 >60 mL/min   Anion gap 7  5 - 15    Comment: Performed at [facility], [address] [place], [place] [address]  Brain natriuretic peptide     Status: None   Collection Time: 01/07/18  5:54 AM  Result Value Ref Range   B Natriuretic Peptide 27.0 0.0 - 100.0 pg/mL    Comment: Performed at [facility], [address] [place], [place] [address]  Troponin I - Once     Status: None   Collection Time: 01/07/18  5:54 AM  Result Value Ref Range   Troponin I <0.03 <0.03 ng/mL    Comment: Performed at [facility], [address] [place], [place] [address]  CBC with Differential     Status: Abnormal   Collection Time: 01/07/18  5:54 AM  Result Value Ref Range   WBC 9.0 4.0 - 10.5 K/uL   RBC 4.78 4.22 - 5.81 MIL/uL   Hemoglobin 13.8 13.0 - 17.0 g/dL   HCT 40.7 39.0 - 52.0 %   MCV 85.1 80.0 - 100.0 fL   MCH 28.9 26.0 - 34.0 pg   MCHC 33.9 30.0 - 36.0 g/dL   RDW 13.4 11.5 - 15.5 %    Platelets 271 150 - 400 K/uL   nRBC 0.0 0.0 - 0.2 %   Neutrophils Relative % 50 %   Neutro Abs 4.6 1.7 - 7.7 K/uL   Lymphocytes Relative 38 %   Lymphs Abs 3.4 0.7 - 4.0 K/uL   Monocytes Relative 7 %   Monocytes Absolute 0.6 0.1 - 1.0 K/uL   Eosinophils Relative 2 %   Eosinophils Absolute 0.2 0.0 - 0.5 K/uL   Basophils Relative 1 %   Basophils Absolute 0.1 0.0 - 0.1 K/uL   Immature Granulocytes 2 %   Abs Immature Granulocytes 0.18 (H) 0.00 - 0.07 K/uL    Comment: Performed at [facility], [address] [place],  [address]  Lipid panel     Status: Abnormal   Collection Time: 01/07/18  5:54 AM  Result Value Ref Range   Cholesterol 219 (H) 0 - 200 mg/dL   Triglycerides 240 (H) <150 mg/dL   HDL 33 (L) >40 mg/dL   Total CHOL/HDL Ratio 6.6 RATIO   VLDL 48 (H) 0 - 40 mg/dL   LDL Cholesterol 138 (H) 0 - 99 mg/dL    Comment:        Total Cholesterol/HDL:CHD Risk Coronary Heart Disease Risk Table                     Men   Women  1/2 Average Risk   3.4   3.3  Average Risk       5.0   4.4  2 X Average Risk   9.6   7.1  3 X Average Risk  23.4   11.0        Use the calculated Patient Ratio above and the CHD Risk Table to determine the patient's CHD Risk.        ATP III CLASSIFICATION (LDL):  <100     mg/dL   Optimal  100-129  mg/dL   Near or Above                    Optimal  130-159  mg/dL   Borderline  160-189  mg/dL   High  >190     mg/dL   Very High Performed at [facility], [address], [place],  [address]   Influenza  panel by PCR (type A & B)     Status: None   Collection Time: 01/07/18  6:04 AM  Result Value Ref Range   Influenza A By PCR NEGATIVE NEGATIVE   Influenza B By PCR NEGATIVE NEGATIVE    Comment: (NOTE) The Xpert Xpress Flu assay is intended as an aid in the diagnosis of  influenza and should not be used as a sole basis for treatment.  This  assay is FDA approved for nasopharyngeal swab specimens only. Nasal  washings  and aspirates are unacceptable for Xpert Xpress Flu testing. Performed at [facility], [address] [place], [place] [address]   APTT     Status: None   Collection Time: 01/07/18  7:30 AM  Result Value Ref Range   aPTT 32 24 - 36 seconds    Comment: Performed at [facility], [address] [place], [place] [address]  Protime-INR     Status: None   Collection Time: 01/07/18  7:30 AM  Result Value Ref Range   Prothrombin Time 13.6 11.4 - 15.2 seconds   INR 1.05     Comment: Performed at [facility], [address] [place], [place] [address]  Troponin I - Once     Status: None   Collection Time: 01/07/18 10:35 AM  Result Value Ref Range   Troponin I <0.03 <0.03 ng/mL    Comment: Performed at [facility], [address] [place],  [address]   Dg Chest Port 1 View  Result Date: 01/07/2018 CLINICAL DATA:  52 year old male with shortness of breath and chest pain. EXAM: PORTABLE CHEST 1 VIEW COMPARISON:  Chest radiograph dated 07/17/2017 FINDINGS: There is mild cardiomegaly. No vascular congestion or edema. No focal consolidation, pleural effusion, or pneumothorax. No acute osseous pathology. IMPRESSION: 1. No acute cardiopulmonary process. 2. Mild cardiomegaly. Electronically Signed   By: Anner Crete M.D.   On: 01/07/2018 06:36    Pending Labs Unresulted Labs (From admission, onward)    Start     Ordered   01/07/18 1500  Heparin level (unfractionated)  Once-Timed,   STAT     01/07/18 0651   01/07/18 1200  Troponin I - Once  Once,   STAT     01/07/18 0712   01/07/18 0632  Calcium, ionized  Once,   STAT    Question:  Specimen collection method  Answer:  Unit=Unit collect   01/07/18 0631   01/07/18 0554  Magnesium  Once,   AD     01/07/18 0554   01/07/18 0554  Phosphorus  Once,   AD     01/07/18 0554   Signed and Held  HIV antibody (Routine Testing)  Once,   R     Signed and Held          Vitals/Pain Today's Vitals    01/07/18 1200 01/07/18 1209 01/07/18 1215 01/07/18 1242  BP: 119/70  116/67   Pulse: 74  75   Resp: 17  18   Temp:      TempSrc:      SpO2: 95%  96%   Weight:      Height:      PainSc:  6   6     Isolation Precautions No active isolations  Medications Medications  nitroGLYCERIN (NITROSTAT) SL tablet 0.4 mg (0.4 mg Sublingual Given 01/07/18 0602)  nitroGLYCERIN 50 mg in dextrose 5 % 250 mL (0.2 mg/mL) infusion (30 mcg/min Intravenous Rate/Dose Change 01/07/18 1128)  aspirin  EC tablet 81 mg (has no administration in time range)  carvedilol (COREG) tablet 3.125 mg (0 mg Oral Hold 01/07/18 0720)  rosuvastatin (CRESTOR) tablet 5 mg (has no administration in time range)  morphine 2 MG/ML injection 2 mg ( Intravenous See Alternative 01/07/18 1129)    Or  morphine 4 MG/ML injection 4 mg (4 mg Intravenous Given 01/07/18 1129)  heparin ADULT infusion 100 units/mL (25000 units/255mL sodium chloride 0.45%) (1,400 Units/hr Intravenous New Bag/Given 01/07/18 0739)  aspirin chewable tablet 324 mg (324 mg Oral Given 01/07/18 0601)  heparin bolus via infusion 4,000 Units (4,000 Units Intravenous Bolus from Bag 01/07/18 0741)  alum & mag hydroxide-simeth (MAALOX/MYLANTA) 200-200-20 MG/5ML suspension 30 mL (30 mLs Oral Given 01/07/18 1031)    And  lidocaine (XYLOCAINE) 2 % viscous mouth solution 15 mL (15 mLs Oral Given 01/07/18 1032)    Mobility walks with person assist  Report called to [facility] on ICU. Pt to transport to bed 7.

## 2018-01-07 NOTE — ED Notes (Signed)
Trialed pt's NTG drip at higher level d/t continued c/o severe pain. Changed from 7mcg to 77mcg and then to 120mcg for approx. 10 min totoal. Pt states NTG does not appear to help pain whatsoever. Pt coughed during assessment and states pain is worst with movement/coughing. Given 4mg  IV Morphine.

## 2018-01-07 NOTE — Progress Notes (Signed)
   01/07/18 2300  Clinical Encounter Type  Visited With Patient  Visit Type Initial  Referral From Patient  Consult/Referral To Chaplain  Spiritual Encounters  Spiritual Needs Prayer;Emotional  Stress Factors  Patient Stress Factors Health changes;Loss  Advance Directives (For Healthcare)  Does Patient Have a Medical Advance Directive? No  Would patient like information on creating a medical advance directive? Yes (Inpatient - patient requests chaplain consult to create a medical advance directive)

## 2018-01-07 NOTE — Consult Note (Signed)
Name: Gerald [name] MRN: [id number] DOB: Dec 04, 1965    ADMISSION DATE:  01/07/2018 CONSULTATION DATE: 01/07/2018  REFERRING MD : Dr. Posey Pronto   CHIEF COMPLAINT: Chest Pain   BRIEF PATIENT DESCRIPTION:  52 yo male admitted with atypical chest pain requiring heparin and nitroglycerin gtts   SIGNIFICANT EVENTS  12/5-Pt admitted to the stepdown unit   HISTORY OF PRESENT ILLNESS:   This is a 52 yo male with a PMH of OSA (CPAP qhs), Obesity, Nonischemic Cardiomyopathy (EF 55%-60% 06/11/2016), MI, HTN, Hyperlipidemia, Hepatic Steatosis, GERD, Gastritis, Borderline Diabetes Mellitus, and CAD. He presented to [facility] ER on 12/5 with c/o left sided chest pain, intermittent cough, diaphoresis, and mild shortness of breath.  Per ER notes the pt developed progressive left sided crushing substernal chest pain radiating down the left arm, nausea, and diaphoresis while at work around 300 am on 12/5 while walking outside in the cold weather.  His symptoms worsened with exertion.  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  He went back inside to rest, however the shortness of breath did not resolve and he developed nausea.  Therefore, he drove himself to the ER for further evaluation. Per ER notes the pt stated 5 days prior to arrival he had a temp of 102 degrees, and has had a dry cough over the past 2 weeks.  In the ER initial EKG revealed no evidence of acute ischemia, however chest pain persisted and he received a total of 3 sublingual nitroglycerin with minimal improvement.  Lab results revealed glucose 236, AST 49, ALT 69, BNP 27, troponin <9.03, cholesterol 219, HDL 33, LDL 138, triglycerides 240, VLDL 48, wbc 9.0, and CXR negative. He was started on a heparin and nitroglycerin gtt Cardiology consulted.  He was subsequently admitted to the stepdown unit for additional workup and treatment.    PAST MEDICAL HISTORY :   has a past  medical history of Abnormal LFTs, Coronary artery disease, Diabetes mellitus without complication ([place]), Fundic gland polyps of stomach, benign, Gastritis, GERD (gastroesophageal reflux disease), Hepatic steatosis, Hyperlipidemia, Hypertension, Myocardial infarction ([place]) (09/2011), Nonischemic cardiomyopathy ([place]), Obesity, and Sleep apnea.  has a past surgical history that includes Colonoscopy; Cardiac catheterization (N/A, 09/2011); Cardiac catheterization (09/2010); Knee arthroscopy with medial menisectomy (Right, 09/16/2012); Cardiac catheterization; LEFT HEART CATH AND CORONARY ANGIOGRAPHY (N/A, 06/11/2016); and Esophagogastroduodenoscopy (egd) with propofol (N/A, 06/10/2017). Prior to Admission medications   Medication Sig Start Date End Date Taking? Authorizing Provider  acetaminophen (TYLENOL) 325 MG tablet Take 325-650 mg by mouth every 6 (six) hours as needed for mild pain or headache.   Yes [provider]  albuterol (PROVENTIL HFA;VENTOLIN HFA) 108 (90 Base) MCG/ACT inhaler Inhale 2 puffs into the lungs every 6 (six) hours as needed for wheezing or shortness of breath. 02/25/17  Yes Nance Pear, MD  citalopram (CELEXA) 40 MG tablet TAKE 1 TABLET BY MOUTH EVERY DAY FOR GAD 07/30/17  Yes Ronnell Freshwater, NP  Dulaglutide (TRULICITY) 1.5 ZR/0.0TM SOPN Inject 1.5 mg into the skin every Wednesday. 08/05/17  Yes Boscia, Greer Ee, NP  Insulin Detemir (LEVEMIR FLEXTOUCH) 100 UNIT/ML Pen Inject 50units [place] daily. Patient taking differently: Inject 50 Units into the skin every evening. Inject 50units [place] daily. 11/05/17  Yes Boscia, Greer Ee, NP  lisinopril-hydrochlorothiazide (PRINZIDE,ZESTORETIC) 20-25 MG tablet Take 1 tablet by mouth daily. 07/30/17  Yes Ronnell Freshwater, NP  amoxicillin-clavulanate (AUGMENTIN) 875-125 MG tablet Take 1 tablet by  mouth 2 (two) times daily. Patient not taking: Reported on 01/07/2018 11/20/17   Kendell Bane, NP  chlorpheniramine-HYDROcodone [facility]  PENNKINETIC ER) 10-8 MG/5ML SUER Take 5 mLs by mouth every 12 (twelve) hours as needed for cough. Patient not taking: Reported on 01/07/2018 11/20/17   Kendell Bane, NP  pantoprazole (PROTONIX) 40 MG tablet Take 1 tablet (40 mg total) by mouth 2 (two) times daily before a meal. Patient not taking: Reported on 01/07/2018 09/30/17   Ronnell Freshwater, NP   Allergies  Allergen Reactions  . Statins Rash         FAMILY HISTORY:  family history includes Breast cancer in his mother; Heart attack in his father; Heart disease (age of onset: 20) in his father; Hypertension in his mother; Stomach cancer in his father. SOCIAL HISTORY:  reports that he has never smoked. He has never used smokeless tobacco. He reports that he drank alcohol. He reports that he does not use drugs.  REVIEW OF SYSTEMS: Positives in BOLD  Constitutional: Negative for fever, chills, weight loss, malaise/fatigue and diaphoresis.  HENT: Negative for hearing loss, ear pain, nosebleeds, congestion, sore throat, neck pain, tinnitus and ear discharge.   Eyes: Negative for blurred vision, double vision, photophobia, pain, discharge and redness.  Respiratory: Negative for cough, hemoptysis, sputum production, shortness of breath, wheezing and stridor.   Cardiovascular: chest pain, palpitations, orthopnea, claudication, leg swelling and PND.  Gastrointestinal: Negative for heartburn, nausea, vomiting, abdominal pain, diarrhea, constipation, blood in stool and melena.  Genitourinary: Negative for dysuria, urgency, frequency, hematuria and flank pain.  Musculoskeletal: Negative for myalgias, back pain, joint pain and falls.  Skin: Negative for itching and rash.  Neurological: Negative for dizziness, tingling, tremors, sensory change, speech change, focal weakness, seizures, loss of consciousness, weakness and headaches.  Endo/Heme/Allergies: Negative for environmental allergies and polydipsia. Does not bruise/bleed  easily.  SUBJECTIVE:  c/o chest pain   VITAL SIGNS: Temp:  [98.5 F (36.9 C)] 98.5 F (36.9 C) (12/05 0556) Pulse Rate:  [71-85] 71 (12/05 1030) Resp:  [12-21] 21 (12/05 1030) BP: (123-163)/(62-89) 127/62 (12/05 1030) SpO2:  [93 %-99 %] 95 % (12/05 1030) Weight:  [149.7 kg] 149.7 kg (12/05 0543)  PHYSICAL EXAMINATION: General: well developed, well nourished male, NAD  Neuro: alert and oriented, follows commands  HEENT: supple, no JVD Cardiovascular: sinus rhythm, rrr, no R/G Lungs: clear throughout, even, non labored  Abdomen: +BS x4, obese, soft, non tender, non distended  Musculoskeletal: normal bulk and tone, no edema  Skin: intact no rashes or lesions   Recent Labs  Lab 01/07/18 0554  NA 137  K 3.6  CL 100  CO2 30  BUN 15  CREATININE 0.79  GLUCOSE 236*   Recent Labs  Lab 01/07/18 0554  HGB 13.8  HCT 40.7  WBC 9.0  PLT 271   Dg Chest Port 1 View  Result Date: 01/07/2018 CLINICAL DATA:  52 year old male with shortness of breath and chest pain. EXAM: PORTABLE CHEST 1 VIEW COMPARISON:  Chest radiograph dated 07/17/2017 FINDINGS: There is mild cardiomegaly. No vascular congestion or edema. No focal consolidation, pleural effusion, or pneumothorax. No acute osseous pathology. IMPRESSION: 1. No acute cardiopulmonary process. 2. Mild cardiomegaly. Electronically Signed   By: Anner Crete M.D.   On: 01/07/2018 06:36    ASSESSMENT / PLAN:  Unstable angina (pt has hx of Nonobstructive CAD with 30% LAD stenosis has had multiple cardiac catheterizations)  Hx: HTN, Hyperlipidemia, and MI  Supplemental O2 for dyspnea  and/or hypoxia Trend troponin's  Echo and CTA Chest pending  Continue nitroglycerin for chest pain  Continue heparin gtt  Trend CBC Monitor for s/sx of bleeding and transfuse for hgb <8 Continue aspirin, carvedilol, lisinopril, hydrochlorothiazide, and rosuvastatin  Prn morphine for pain management Cardiology consulted appreciate input   OSA CPAP  qhs   GERD  Continue protonix   Type II Diabetes Mellitus  SSI and scheduled lantus   Marda Stalker, [place] Pager [phone] (please enter 7 digits) PCCM Consult Pager [phone] (please enter 7 digits)

## 2018-01-07 NOTE — ED Notes (Addendum)
Pt complaining of sharp pain radiating down L arm beginning approx 10 minutes ago along with a sensation of constriction around the ring finger on his L hand. Vitals verified WNL, EKG shows NSR. Cap refill <3 sec, L radial pulse strong. Moderate non-pitting edema noted. This nurse removed a black metal band from the L ring finger and handed the band to the patients mother who was sitting at the bedside.

## 2018-01-07 NOTE — Progress Notes (Signed)
ANTICOAGULATION CONSULT NOTE - Initial Consult  Pharmacy Consult for heparin drip Indication: chest pain/ACS  Allergies  Allergen Reactions  . Statins Rash         Patient Measurements: Height: 6\' 2"  (188 cm) Weight: (!) 330 lb (149.7 kg) IBW/kg (Calculated) : 82.2 Heparin Dosing Weight: 116 kg  Vital Signs: Temp: 98.5 F (36.9 C) (12/05 0556) Temp Source: Oral (12/05 0556) BP: 131/78 (12/05 5852) Pulse Rate: 78 (12/05 0638)  Labs: Recent Labs    01/07/18 0554  HGB 13.8  HCT 40.7  PLT 271  CREATININE 0.79  TROPONINI <0.03    Estimated Creatinine Clearance: 168.7 mL/min (by C-G formula based on SCr of 0.79 mg/dL).   Medical History: Past Medical History:  Diagnosis Date  . Abnormal LFTs   . Coronary artery disease    Mild nonobstructive coronary artery disease on previous cardiac catheterization most recently in August of 2013  . Diabetes mellitus without complication (HCC)    borderline  . Fundic gland polyps of stomach, benign   . Gastritis   . GERD (gastroesophageal reflux disease)   . Hepatic steatosis   . Hyperlipidemia   . Hypertension   . Myocardial infarction ([place]) 09/2011  . Nonischemic cardiomyopathy (HCC)    Previous ejection fraction of 35-40% on echo. 50% on cardiac catheterization in August of 2013  . Obesity   . Sleep apnea     Medications:  Scheduled:  . [START ON 01/08/2018] aspirin EC  81 mg Oral Daily  . carvedilol  3.125 mg Oral BID WC  . heparin  4,000 Units Intravenous Once  . rosuvastatin  5 mg Oral q1800    Assessment: Patient admitted w/ CP being started on heparin drip no PTA anticoag.  Goal of Therapy:  Heparin level 0.3-0.7 units/ml Monitor platelets by anticoagulation protocol: Yes   Plan:  Will bolus w/ heparin 4000 units IV x 1 Will start rate at 1400 units/hr  Will check anti-Xa @ 1500 Baseline labs drawn Will monitor daily CBC adjust per anti-Xa levels  Tobie Lords, PharmD, BCPS Clinical  Pharmacist 01/07/2018

## 2018-01-07 NOTE — H&P (Signed)
[facility] NAME: Gerald [name]    MR#:  [id number]  DATE OF BIRTH:  10-16-1965  DATE OF ADMISSION:  01/07/2018  PRIMARY CARE PHYSICIAN: Ronnell Freshwater, NP   REQUESTING/REFERRING PHYSICIAN: Darel Hong, MD  CHIEF COMPLAINT:   Chief Complaint  Patient presents with  . Chest Pain    HISTORY OF PRESENT ILLNESS:  Gerald [name]  is a 52 y.o. male with a known history of morbid obesity, T2IDDM, HTN p/w CP. Outpt Cardiologist Dr. Rockey Situ. Had cardiac cath 06/11/2016, (+) proximal LAD 30% stenosis, otherwise (-). Endorses 1d Hx CP, onset while at work. Endorses recent cough/congestion/URI; endorses fatigue/malaise, feeling "run down", but denied any other acute issues prior to presentation. States he went to work @~1700PM on Wednesday 01/06/2018 (works as a English as a second language teacher at [employer]). He does not perform hard manual/physical labor or strenuous activity. He states that the majority of his shift was uneventful, but @~0300AM, while he was walking outside in the cold, he developed sudden-onset midchest pressure, which he characterized as "something sitting on my chest". He states he also developed a fit/spell of coughing, as well as SOB. Despite the temperature being in the low 43F range, pt endorses significant diaphoresis. He states his legs went numb. He stopped walking, but there was no improvement in symptoms. He managed to get back inside, and he sat down and rested. He developed nausea, and had continued SOB. He decided to drive himself to the ED. He states he had to stop the car to pull over and throw up (x1) whilst en route. He endorses continued chest pain/pressure at the time of my assessment, though it has improved somewhat after being started on NTG. He appears uncomfortable, but is not in distress.  PAST MEDICAL HISTORY:   Past Medical History:  Diagnosis Date  . Abnormal LFTs   . Coronary  artery disease    Mild nonobstructive coronary artery disease on previous cardiac catheterization most recently in August of 2013  . Diabetes mellitus without complication (HCC)    borderline  . Fundic gland polyps of stomach, benign   . Gastritis   . GERD (gastroesophageal reflux disease)   . Hepatic steatosis   . Hyperlipidemia   . Hypertension   . Myocardial infarction ([place]) 09/2011  . Nonischemic cardiomyopathy (HCC)    Previous ejection fraction of 35-40% on echo. 50% on cardiac catheterization in August of 2013  . Obesity   . Sleep apnea     PAST SURGICAL HISTORY:   Past Surgical History:  Procedure Laterality Date  . CARDIAC CATHETERIZATION N/A 09/2011   [facility]; EF 50% with 30% mid LAD stenosis and no obstructive disease.  Marland Kitchen CARDIAC CATHETERIZATION  09/2010   [facility]; Mid LAD 40% stenosis; Mid Circumflex:Normal; Mid RCA; Normal  . CARDIAC CATHETERIZATION    . COLONOSCOPY    . ESOPHAGOGASTRODUODENOSCOPY (EGD) WITH PROPOFOL N/A 06/10/2017   Procedure: ESOPHAGOGASTRODUODENOSCOPY (EGD) WITH PROPOFOL;  Surgeon: Jonathon Bellows, MD;  Location: [facility] ENDOSCOPY;  Service: Gastroenterology;  Laterality: N/A;  . KNEE ARTHROSCOPY WITH MEDIAL MENISECTOMY Right 09/16/2012   Procedure: RIGHT KNEE ARTHROSCOPY WITH MEDIAL AND LATERAL MENISECTOMY, CHONDROPLASTY;  Surgeon: Ninetta Lights, MD;  Location: [place];  Service: Orthopedics;  Laterality: Right;  RIGHT KNEE SCOPE MEDIAL MENISCECTOMY  . LEFT HEART CATH AND CORONARY ANGIOGRAPHY N/A 06/11/2016   Procedure: Left Heart Cath and Coronary Angiography;  Surgeon: Minna Merritts, MD;  Location:  [place] CV LAB;  Service: Cardiovascular;  Laterality: N/A;    SOCIAL HISTORY:   Social History   Tobacco Use  . Smoking status: Never Smoker  . Smokeless tobacco: Never Used  Substance Use Topics  . Alcohol use: Not Currently    Alcohol/week: 0.0 standard drinks    Comment: rare    FAMILY HISTORY:   Family History  Problem  Relation Age of Onset  . Heart disease Father 47       MI  . Heart attack Father   . Stomach cancer Father   . Hypertension Mother   . Breast cancer Mother     DRUG ALLERGIES:   Allergies  Allergen Reactions  . Statins Rash         REVIEW OF SYSTEMS:   Review of Systems  Constitutional: Positive for diaphoresis and malaise/fatigue. Negative for chills, fever and weight loss.  HENT: Positive for congestion. Negative for ear pain, hearing loss, nosebleeds, sinus pain, sore throat and tinnitus.   Eyes: Negative for blurred vision, double vision and photophobia.  Respiratory: Positive for cough and shortness of breath. Negative for hemoptysis, sputum production and wheezing.   Cardiovascular: Positive for chest pain. Negative for palpitations, orthopnea, claudication, leg swelling and PND.  Gastrointestinal: Positive for nausea and vomiting. Negative for abdominal pain, blood in stool, constipation, diarrhea, heartburn and melena.  Genitourinary: Negative for dysuria, frequency, hematuria and urgency.  Musculoskeletal: Negative for back pain, falls, joint pain, myalgias and neck pain.  Skin: Negative for itching and rash.  Neurological: Positive for sensory change. Negative for dizziness, tingling, tremors, speech change, focal weakness, seizures, loss of consciousness, weakness and headaches.  Psychiatric/Behavioral: Negative for depression and memory loss. The patient is not nervous/anxious and does not have insomnia.    MEDICATIONS AT HOME:   Prior to Admission medications   Medication Sig Start Date End Date Taking? Authorizing Provider  acetaminophen (TYLENOL) 325 MG tablet Take 325-650 mg by mouth every 6 (six) hours as needed for mild pain or headache.   Yes [provider]  albuterol (PROVENTIL HFA;VENTOLIN HFA) 108 (90 Base) MCG/ACT inhaler Inhale 2 puffs into the lungs every 6 (six) hours as needed for wheezing or shortness of breath. 02/25/17  Yes Nance Pear, MD  citalopram (CELEXA) 40 MG tablet TAKE 1 TABLET BY MOUTH EVERY DAY FOR GAD 07/30/17  Yes Ronnell Freshwater, NP  Dulaglutide (TRULICITY) 1.5 ZW/2.5EN SOPN Inject 1.5 mg into the skin every Wednesday. 08/05/17  Yes Boscia, Greer Ee, NP  Insulin Detemir (LEVEMIR FLEXTOUCH) 100 UNIT/ML Pen Inject 50units [place] daily. Patient taking differently: Inject 50 Units into the skin every evening. Inject 50units [place] daily. 11/05/17  Yes Boscia, Greer Ee, NP  lisinopril-hydrochlorothiazide (PRINZIDE,ZESTORETIC) 20-25 MG tablet Take 1 tablet by mouth daily. 07/30/17  Yes Ronnell Freshwater, NP  amoxicillin-clavulanate (AUGMENTIN) 875-125 MG tablet Take 1 tablet by mouth 2 (two) times daily. Patient not taking: Reported on 01/07/2018 11/20/17   Kendell Bane, NP  chlorpheniramine-HYDROcodone [facility] PENNKINETIC ER) 10-8 MG/5ML SUER Take 5 mLs by mouth every 12 (twelve) hours as needed for cough. Patient not taking: Reported on 01/07/2018 11/20/17   Kendell Bane, NP  pantoprazole (PROTONIX) 40 MG tablet Take 1 tablet (40 mg total) by mouth 2 (two) times daily before a meal. Patient not taking: Reported on 01/07/2018 09/30/17   Ronnell Freshwater, NP      VITAL SIGNS:  Blood pressure 131/78, pulse 78, temperature 98.5 F (36.9 C), temperature  source Oral, resp. rate 18, height 6\' 2"  (1.88 m), weight (!) 149.7 kg, SpO2 95 %.  PHYSICAL EXAMINATION:  Physical Exam  Constitutional: He is oriented to person, place, and time. He appears well-developed and well-nourished. He is active and cooperative.  Non-toxic appearance. He does not have a sickly appearance. No distress. He is not intubated.  HENT:  Head: Normocephalic and atraumatic.  Mouth/Throat: Oropharynx is clear and moist. No oropharyngeal exudate.  Eyes: Conjunctivae, EOM and lids are normal. No scleral icterus.  Neck: Neck supple. No JVD present. No thyromegaly present.  Cardiovascular: Normal rate, regular rhythm, S1 normal, S2 normal and  normal heart sounds.  No extrasystoles are present. Exam reveals no gallop, no S3, no S4, no distant heart sounds and no friction rub.  No murmur heard. Pulmonary/Chest: Effort normal and breath sounds normal. No accessory muscle usage or stridor. No apnea, no tachypnea and no bradypnea. He is not intubated. No respiratory distress. He has no decreased breath sounds. He has no wheezes. He has no rhonchi. He has no rales.  Abdominal: Soft. He exhibits no distension. Bowel sounds are decreased. There is no tenderness. There is no rigidity, no rebound and no guarding.  Musculoskeletal: Normal range of motion. He exhibits no edema or tenderness.       Right lower leg: Normal. He exhibits no tenderness and no edema.       Left lower leg: Normal. He exhibits no tenderness and no edema.  Lymphadenopathy:    He has no cervical adenopathy.  Neurological: He is alert and oriented to person, place, and time. He is not disoriented. No cranial nerve deficit.  Skin: Skin is warm, dry and intact. No rash noted. He is not diaphoretic. No erythema.  Psychiatric: He has a normal mood and affect. His speech is normal and behavior is normal. Judgment and thought content normal. His mood appears not anxious. He is not agitated. Cognition and memory are normal.   LABORATORY PANEL:   CBC Recent Labs  Lab 01/07/18 0554  WBC 9.0  HGB 13.8  HCT 40.7  PLT 271   ------------------------------------------------------------------------------------------------------------------  Chemistries  Recent Labs  Lab 01/07/18 0554  NA 137  K 3.6  CL 100  CO2 30  GLUCOSE 236*  BUN 15  CREATININE 0.79  CALCIUM 8.8*  AST 49*  ALT 69*  ALKPHOS 82  BILITOT 0.6   ------------------------------------------------------------------------------------------------------------------  Cardiac Enzymes Recent Labs  Lab 01/07/18 0554  TROPONINI <0.03    ------------------------------------------------------------------------------------------------------------------  RADIOLOGY:  Dg Chest Port 1 View  Result Date: 01/07/2018 CLINICAL DATA:  52 year old male with shortness of breath and chest pain. EXAM: PORTABLE CHEST 1 VIEW COMPARISON:  Chest radiograph dated 07/17/2017 FINDINGS: There is mild cardiomegaly. No vascular congestion or edema. No focal consolidation, pleural effusion, or pneumothorax. No acute osseous pathology. IMPRESSION: 1. No acute cardiopulmonary process. 2. Mild cardiomegaly. Electronically Signed   By: Anner Crete M.D.   On: 01/07/2018 06:36   IMPRESSION AND PLAN:   A/P: 7M w/ PMHx morbid obesity, T2IDDM, HTN, known 30% proximal LAD stenosis (as of 06/11/2016 cardiac cath) p/w CP. Hyperglycemia (w/ T2IDDM), hypocalcemia, transaminasemia, cough/URI. -CP, unstable angina: Pt w/ known CAD, 30% LAD stenosis. Outpt Cardiologist is Dr. Rockey Situ. p/w 1d Hx CP, (+) diaphoresis, (+) N/V. CP responded to NTG in ED, started on NTG gtt. Heparin gtt. Extensive cardiac risk. Tele, continuous cardiac monitoring. ASA, c/w home ARB. Not on beta blocker or statin, started on low-dose Coreg and Crestor (documented Statin intolerance).  Lipids pending. Morphine, NTG, O2 PRN. Cardiology consulted [facility]), suspect pt will need repeat cath. -Hyperglycemia, T2IDDM: SSI. Lantus, reduced dose (NPO). -Hypocalcemia: Ionized calcium. -Transaminasemia: AST 49, ALT 69. Chronic elevation, appears to be fairly persistent since 2015. Likely NAFLD. < 2-3x ULN. Outpt f/up. -Cough, URI: Flu (-). -c/w home meds/formulary subs. -FEN/GI: NPO. -DVT PPx: Heparin gtt. -Code status: Full code. -Disposition: Admission, > 2 midnights.   All the records are reviewed and case discussed with ED provider. Management plans discussed with the patient, family and they are in agreement.  CODE STATUS: Full code.  TOTAL TIME TAKING CARE OF THIS PATIENT: 75 minutes.     Arta Silence M.D on 01/07/2018 at 6:43 AM  Between 7am to 6pm - Pager - [phone]  After 6pm go to [email or web address] - Proofreader  [facility]  Office  [phone]  CC: Primary care physician; Ronnell Freshwater, NP   Note: This dictation was prepared with Dragon dictation along with smaller phrase technology. Any transcriptional errors that result from this process are unintentional.

## 2018-01-07 NOTE — ED Notes (Signed)
Received call from Dr Rockey Situ from cardiology to discuss current pt status. Notified him about the acute onset L arm pain and edema, vitals, and impressions. Discussed increasing titration of nitro drip in response to chest and L arm pain. Dr Rockey Situ recommended GI cocktail based on pt Hx and 4 hr troponin to identify any trend.

## 2018-01-08 ENCOUNTER — Inpatient Hospital Stay (HOSPITAL_COMMUNITY)
Admit: 2018-01-08 | Discharge: 2018-01-08 | Disposition: A | Discharge status: Home / Self Care | Payer: 59 | Attending: Critical Care Medicine | Admitting: Critical Care Medicine

## 2018-01-08 ENCOUNTER — Inpatient Hospital Stay: Payer: 59

## 2018-01-08 DIAGNOSIS — I200 Unstable angina: Secondary | ICD-10-CM

## 2018-01-08 DIAGNOSIS — R079 Chest pain, unspecified: Secondary | ICD-10-CM | POA: Diagnosis present

## 2018-01-08 LAB — CBC
HCT: 37.7 % — ABNORMAL LOW (ref 39.0–52.0)
Hemoglobin: 12.6 g/dL — ABNORMAL LOW (ref 13.0–17.0)
MCH: 28.6 pg (ref 26.0–34.0)
MCHC: 33.4 g/dL (ref 30.0–36.0)
MCV: 85.5 fL (ref 80.0–100.0)
Platelets: 240 10*3/uL (ref 150–400)
RBC: 4.41 MIL/uL (ref 4.22–5.81)
RDW: 13.7 % (ref 11.5–15.5)
WBC: 11.3 10*3/uL — AB (ref 4.0–10.5)
nRBC: 0 % (ref 0.0–0.2)

## 2018-01-08 LAB — GLUCOSE, CAPILLARY
Glucose-Capillary: 139 mg/dL — ABNORMAL HIGH (ref 70–99)
Glucose-Capillary: 212 mg/dL — ABNORMAL HIGH (ref 70–99)
Glucose-Capillary: 186 mg/dL — ABNORMAL HIGH (ref 70–99)
Glucose-Capillary: 172 mg/dL — ABNORMAL HIGH (ref 70–99)
Glucose-Capillary: 210 mg/dL — ABNORMAL HIGH (ref 70–99)

## 2018-01-08 LAB — HIV ANTIBODY (ROUTINE TESTING W REFLEX): HIV SCREEN 4TH GENERATION: NONREACTIVE

## 2018-01-08 LAB — CALCIUM, IONIZED: Calcium, Ionized, Serum: 4.6 mg/dL (ref 4.5–5.6)

## 2018-01-08 LAB — POCT I-STAT CREATININE: Creatinine, Ser: 0.9 mg/dL (ref 0.61–1.24)

## 2018-01-08 LAB — ECHOCARDIOGRAM COMPLETE
Height: 74 in
Weight: 5395.2 oz

## 2018-01-08 MED ORDER — ZOLPIDEM TARTRATE 5 MG PO TABS
5.0000 mg | ORAL_TABLET | Freq: Every evening | ORAL | Status: DC | PRN
  Administered 2018-01-08: 5 mg via ORAL
  Filled 2018-01-08: qty 1

## 2018-01-08 MED ORDER — IOPAMIDOL (ISOVUE-300) INJECTION 61%
100.0000 mL | Freq: Once | INTRAVENOUS | Status: AC | PRN
  Administered 2018-01-08: 100 mL via INTRAVENOUS

## 2018-01-08 MED ORDER — KETOROLAC TROMETHAMINE 30 MG/ML IJ SOLN
30.0000 mg | Freq: Four times a day (QID) | INTRAMUSCULAR | Status: DC
  Administered 2018-01-08 – 2018-01-09 (×4): 30 mg via INTRAVENOUS
  Filled 2018-01-08 (×4): qty 1

## 2018-01-08 MED ORDER — SODIUM CHLORIDE 0.9% FLUSH
3.0000 mL | Freq: Two times a day (BID) | INTRAVENOUS | Status: DC
  Administered 2018-01-08: 3 mL via INTRAVENOUS

## 2018-01-08 MED ORDER — IOPAMIDOL (ISOVUE-300) INJECTION 61%
15.0000 mL | INTRAVENOUS | Status: AC
  Administered 2018-01-08 (×2): 15 mL via ORAL

## 2018-01-08 NOTE — Progress Notes (Signed)
Pt refused cpap tonight, Pt on 2L [place] sats are well

## 2018-01-08 NOTE — Plan of Care (Signed)
  Problem: Health Behavior/Discharge Planning: Goal: Ability to manage health-related needs will improve Outcome: Progressing   Problem: Safety: Goal: Ability to remain free from injury will improve Outcome: Progressing   Problem: Education: Goal: Understanding of cardiac disease, CV risk reduction, and recovery process will improve Outcome: Progressing

## 2018-01-08 NOTE — Consult Note (Addendum)
GI Inpatient Consult Note  Reason for Consult: Atypical chest pain   Attending Requesting Consult: Dr. Posey Pronto, MD  History of Present Illness: Gerald [name] is a 52 y.o. male seen for evaluation of atypical chest pain at the request of Dr. Posey Pronto. Pt has a PMH of nonobstructive CAD, Type II DM, HTN, HLD, OSA on CPAP, hepatic steatosis, GERD, and morbid obesity admitted for atypical chest pain. He has been seen by cardiology during this admission where CTA negative and felt chest pain was not cardiac in nature. He has hx of three previous cardiac catheterizations in 2013, 2016, and 2018 all of which were negative. GI was consulted for chest pain.  Patient seen and examined resting comfortably in hospital bed. CT abd/pelvis was performed today which showed no acute abnormality. He reports chest pain is about the same today. He reports chest pain is midsternum and "feels like someone is sitting on top of my chest." Food doesn't clearly make the pain worse or better. Appetite is decreased. He denies any solid food or liquid dysphagia or odynophagia. He had EGD 06/2017 performed by Dr. Vicente Males which showed several non-bleeding superficial gastric ulcers. It was recommended he have repeat EGD in 8 weeks to survey healing, but pt did not follow-up. He denies any overt reflux or acid regurgitation symptoms. He is not on PPI at home. No episodes of nausea or vomiting since being in the hospital. No known family hx of esophageal or stomach cancers.    Last Colonoscopy:  Last Endoscopy 06/10/2017:  - Normal examined duodenum. - Normal esophagus. - Non-bleeding gastric ulcers with a clean ulcer base ([place] Class III). Biopsied. Path: - ANTRAL AND OXYNTIC MUCOSA WITH MILD CHRONIC GASTRITIS AND  REGENERATIVE/REPARATIVE CHANGE.  - NEGATIVE FOR H. PYLORI, DYSPLASIA AND MALIGNANCY.    Past Medical History:  Past Medical History:  Diagnosis Date  . Abnormal LFTs   . Diabetes mellitus without complication  (HCC)    borderline  . Fundic gland polyps of stomach, benign   . Gastritis   . GERD (gastroesophageal reflux disease)   . Hepatic steatosis   . Hyperlipidemia   . Hypertension   . Nonischemic cardiomyopathy (HCC)    Previous ejection fraction of 35-40% on echo. 50% on cardiac catheterization in August of 2013  . Obesity   . Sleep apnea     Problem List: Patient Active Problem List   Diagnosis Date Noted  . Gastroesophageal reflux disease without esophagitis 08/23/2017  . Urinary tract infection without hematuria 06/14/2017  . Acute non-recurrent sinusitis 06/14/2017  . Generalized anxiety disorder 02/02/2017  . Other fatigue 02/02/2017  . Abnormal results of liver function studies 02/02/2017  . Myalgia 02/02/2017  . Edema, unspecified 02/02/2017  . Circadian rhythm sleep disorder, shift work type 02/02/2017  . Acne vulgaris 02/02/2017  . Occlusion and stenosis of bilateral carotid arteries 02/02/2017  . Abnormal weight gain 02/02/2017  . Pain in unspecified knee 02/02/2017  . Neoplasm of uncertain behavior of skin 02/02/2017  . Hypersomnia, unspecified 02/02/2017  . Shortness of breath 02/02/2017  . Morbid obesity ([place]) 07/07/2016  . Atypical chest pain 06/08/2016  . Other chest pain 05/22/2014  . Mass of jaw 05/22/2014  . Right flank pain 11/23/2013  . Dysuria 11/23/2013  . Encounter for wellness examination 11/08/2013  . Retaining fluid 11/08/2013  . Urinary incontinence 03/14/2013  . Depression 01/13/2013  . Type 2 diabetes mellitus ([place]) 09/14/2012  . Nonischemic cardiomyopathy ([place])   . Mixed hyperlipidemia 05/28/2012  .  Chronic pain syndrome 03/30/2012  . HTN (hypertension) 03/30/2012    Past Surgical History: Past Surgical History:  Procedure Laterality Date  . CARDIAC CATHETERIZATION N/A 09/2011   [facility]; EF 50% with 30% mid LAD stenosis and no obstructive disease.  Marland Kitchen CARDIAC CATHETERIZATION  09/2010   [facility]; Mid LAD 40% stenosis; Mid Circumflex:Normal; Mid  RCA; Normal  . CARDIAC CATHETERIZATION    . COLONOSCOPY    . ESOPHAGOGASTRODUODENOSCOPY (EGD) WITH PROPOFOL N/A 06/10/2017   Procedure: ESOPHAGOGASTRODUODENOSCOPY (EGD) WITH PROPOFOL;  Surgeon: Jonathon Bellows, MD;  Location: [facility] ENDOSCOPY;  Service: Gastroenterology;  Laterality: N/A;  . KNEE ARTHROSCOPY WITH MEDIAL MENISECTOMY Right 09/16/2012   Procedure: RIGHT KNEE ARTHROSCOPY WITH MEDIAL AND LATERAL MENISECTOMY, CHONDROPLASTY;  Surgeon: Ninetta Lights, MD;  Location: [place];  Service: Orthopedics;  Laterality: Right;  RIGHT KNEE SCOPE MEDIAL MENISCECTOMY  . LEFT HEART CATH AND CORONARY ANGIOGRAPHY N/A 06/11/2016   Procedure: Left Heart Cath and Coronary Angiography;  Surgeon: Minna Merritts, MD;  Location: [place] CV LAB;  Service: Cardiovascular;  Laterality: N/A;    Allergies: Allergies  Allergen Reactions  . Statins Rash         Home Medications: Medications Prior to Admission  Medication Sig Dispense Refill Last Dose  . acetaminophen (TYLENOL) 325 MG tablet Take 325-650 mg by mouth every 6 (six) hours as needed for mild pain or headache.   prn at prn  . albuterol (PROVENTIL HFA;VENTOLIN HFA) 108 (90 Base) MCG/ACT inhaler Inhale 2 puffs into the lungs every 6 (six) hours as needed for wheezing or shortness of breath. 1 Inhaler 0 prn at prn  . citalopram (CELEXA) 40 MG tablet TAKE 1 TABLET BY MOUTH EVERY DAY FOR GAD 90 tablet 4 01/06/2018 at Unknown time  . Dulaglutide (TRULICITY) 1.5 HW/2.9HB SOPN Inject 1.5 mg into the skin every Wednesday. 4 pen 3 01/06/2018 at Unknown time  . Insulin Detemir (LEVEMIR FLEXTOUCH) 100 UNIT/ML Pen Inject 50units [place] daily. (Patient taking differently: Inject 50 Units into the skin every evening. Inject 50units [place] daily.) 15 mL 11 01/06/2018 at Unknown time  . lisinopril-hydrochlorothiazide (PRINZIDE,ZESTORETIC) 20-25 MG tablet Take 1 tablet by mouth daily. 30 tablet 3 01/06/2018 at Unknown time  . amoxicillin-clavulanate (AUGMENTIN)  875-125 MG tablet Take 1 tablet by mouth 2 (two) times daily. (Patient not taking: Reported on 01/07/2018) 20 tablet 0 Completed Course at Unknown time  . chlorpheniramine-HYDROcodone (TUSSIONEX PENNKINETIC ER) 10-8 MG/5ML SUER Take 5 mLs by mouth every 12 (twelve) hours as needed for cough. (Patient not taking: Reported on 01/07/2018) 115 mL 0 Completed Course at Unknown time  . pantoprazole (PROTONIX) 40 MG tablet Take 1 tablet (40 mg total) by mouth 2 (two) times daily before a meal. (Patient not taking: Reported on 01/07/2018) 60 tablet 5 Not Taking at Unknown time   Home medication reconciliation was completed with the patient.   Scheduled Inpatient Medications:   . aspirin EC  81 mg Oral Daily  . carvedilol  3.125 mg Oral BID WC  . citalopram  40 mg Oral Daily  . enoxaparin (LOVENOX) injection  40 mg Subcutaneous Q12H  . lisinopril  20 mg Oral Daily   And  . hydrochlorothiazide  25 mg Oral Daily  . Influenza vac split quadrivalent PF  0.5 mL Intramuscular Tomorrow-1000  . insulin aspart  0-15 Units Subcutaneous TID WC  . insulin aspart  0-5 Units Subcutaneous QHS  . insulin glargine  35 Units Subcutaneous QHS  . ketorolac  30 mg  Intravenous Q6H  . pantoprazole  40 mg Oral BID  . rosuvastatin  5 mg Oral q1800    Continuous Inpatient Infusions:    PRN Inpatient Medications:  acetaminophen, albuterol, bisacodyl, morphine injection **OR** morphine injection, nitroGLYCERIN, ondansetron (ZOFRAN) IV, senna-docusate  Family History: family history includes Breast cancer in his mother; Heart attack in his father; Heart disease (age of onset: 65) in his father; Hypertension in his mother; Stomach cancer in his father.  The patient's family history is negative for inflammatory bowel disorders, GI malignancy, or solid organ transplantation.  Social History:   reports that he has never smoked. He has never used smokeless tobacco. He reports that he drank alcohol. He reports that he does not  use drugs. The patient denies ETOH, tobacco, or drug use.   Review of Systems: Constitutional: Weight is stable.  Eyes: No changes in vision. ENT: No oral lesions, sore throat.  GI: see HPI.  Heme/Lymph: No easy bruising.  CV: No chest pain.  GU: No hematuria.  Integumentary: No rashes.  Neuro: No headaches.  Psych: No depression/anxiety.  Endocrine: No heat/cold intolerance.  Allergic/Immunologic: No urticaria.  Resp: No cough, SOB.  Musculoskeletal: No joint swelling.    Physical Examination: BP (!) 142/78 (BP Location: Right Arm)   Pulse 84   Temp 98.5 F (36.9 C) (Oral)   Resp 20   Ht 6\' 2"  (1.88 m)   Wt (!) 153 kg   SpO2 97%   BMI 43.29 kg/m  Gen: NAD, alert and oriented x 4 HEENT: PEERLA, EOMI, Neck: supple, no JVD or thyromegaly Chest: CTA bilaterally, no wheezes, crackles, or other adventitious sounds CV: RRR, no m/g/c/r Abd: soft, TTP in LUQ, ND, +BS in all four quadrants; no HSM, guarding, ridigity, or rebound tenderness Ext: no edema, well perfused with 2+ pulses, Skin: no rash or lesions noted Lymph: no LAD  Data: Lab Results  Component Value Date   WBC 11.3 (H) 01/08/2018   HGB 12.6 (L) 01/08/2018   HCT 37.7 (L) 01/08/2018   MCV 85.5 01/08/2018   PLT 240 01/08/2018   Recent Labs  Lab 01/07/18 0554 01/08/18 0317  HGB 13.8 12.6*   Lab Results  Component Value Date   NA 137 01/07/2018   K 3.6 01/07/2018   CL 100 01/07/2018   CO2 30 01/07/2018   BUN 15 01/07/2018   CREATININE 0.79 01/07/2018   Lab Results  Component Value Date   ALT 69 (H) 01/07/2018   AST 49 (H) 01/07/2018   ALKPHOS 82 01/07/2018   BILITOT 0.6 01/07/2018   Recent Labs  Lab 01/07/18 0730  APTT 32  INR 1.05   Assessment/Plan:  53 y/o Caucasian male with a PMH of nonobstructive CAD, Type II DM, HTN, HLD, OSA on CPAP, hepatic steatosis, GERD, and morbid obesity admitted for atypical chest pain  1. Atypical chest pain: - Etiology unknown at this time. He has had  numerous hospital admissions for work-up with cardiac cath x3 with unrevealing etiology - CTA negative on this admission. Echocardiogram negative today. - CT abd/pelvis negative today - Due to symptoms of chest pain, consider esophageal dysmotility in differential (diffuse esophageal spasm) - Barium swallow w/o tablet to assess for esophageal dysmotility - Continue PPI - Will continue to follow along over the weekend with Dr. Marius Ditch.    Thank you for the consult. Please call with questions or concerns.  Geanie Kenning, PA-C [facility]  [phone]

## 2018-01-08 NOTE — Progress Notes (Signed)
*  PRELIMINARY RESULTS* Echocardiogram 2D Echocardiogram has been performed.  Gerald [name] 01/08/2018, 8:49 AM

## 2018-01-08 NOTE — Progress Notes (Signed)
Pt refused cpap

## 2018-01-08 NOTE — Progress Notes (Signed)
[place] at [facility]                                                                                                                                                                                  Patient Demographics   Gerald [name], is a 52 y.o. male, DOB - 1965/06/07, JYN:[id number]  Admit date - 01/07/2018   Admitting Physician Dustin Flock, MD  Outpatient Primary MD for the patient is Ronnell Freshwater, NP   LOS - 1  Subjective: Continues to complain of chest pain as someone sitting on his chest like pressure    Review of Systems:   CONSTITUTIONAL: No documented fever. No fatigue, weakness. No weight gain, no weight loss.  EYES: No blurry or double vision.  ENT: No tinnitus. No postnasal drip. No redness of the oropharynx.  RESPIRATORY: No cough, no wheeze, no hemoptysis. No dyspnea.  CARDIOVASCULAR: Positive chest pain. No orthopnea. No palpitations. No syncope.  GASTROINTESTINAL: No nausea, no vomiting or diarrhea. No abdominal pain. No melena or hematochezia.  GENITOURINARY: No dysuria or hematuria.  ENDOCRINE: No polyuria or nocturia. No heat or cold intolerance.  HEMATOLOGY: No anemia. No bruising. No bleeding.  INTEGUMENTARY: No rashes. No lesions.  MUSCULOSKELETAL: No arthritis. No swelling. No gout.  NEUROLOGIC: No numbness, tingling, or ataxia. No seizure-type activity.  PSYCHIATRIC: No anxiety. No insomnia. No ADD.    Vitals:   Vitals:   01/08/18 0030 01/08/18 0506 01/08/18 0515 01/08/18 0747  BP: 139/72 140/81 (!) 142/72 (!) 142/78  Pulse: 98 87 86 84  Resp:  18  20  Temp:  98 F (36.7 C) 98.4 F (36.9 C) 98.5 F (36.9 C)  TempSrc:   Oral Oral  SpO2:  98% 94% 97%  Weight:  (!) 153 kg    Height:        Wt Readings from Last 3 Encounters:  01/08/18 (!) 153 kg  11/20/17 (!) 152 kg  10/12/17 (!) 152 kg    No intake or output data in the 24 hours ending 01/08/18 1333  Physical Exam:   GENERAL: Pleasant-appearing  in no apparent distress.  HEAD, EYES, EARS, NOSE AND THROAT: Atraumatic, normocephalic. Extraocular muscles are intact. Pupils equal and reactive to light. Sclerae anicteric. No conjunctival injection. No oro-pharyngeal erythema.  NECK: Supple. There is no jugular venous distention. No bruits, no lymphadenopathy, no thyromegaly.  HEART: Regular rate and rhythm,. No murmurs, no rubs, no clicks.  LUNGS: Clear to auscultation bilaterally. No rales or rhonchi. No wheezes.  ABDOMEN: Soft, flat, nontender, nondistended. Has good bowel sounds. No hepatosplenomegaly appreciated.  EXTREMITIES: No evidence of any cyanosis, clubbing, or peripheral edema.  +2 pedal  and radial pulses bilaterally.  NEUROLOGIC: The patient is alert, awake, and oriented x3 with no focal motor or sensory deficits appreciated bilaterally.  SKIN: Moist and warm with no rashes appreciated.  Psych: Not anxious, depressed LN: No inguinal LN enlargement    Antibiotics   Anti-infectives (From admission, onward)   None      Medications   Scheduled Meds: . aspirin EC  81 mg Oral Daily  . carvedilol  3.125 mg Oral BID WC  . citalopram  40 mg Oral Daily  . enoxaparin (LOVENOX) injection  40 mg Subcutaneous Q12H  . lisinopril  20 mg Oral Daily   And  . hydrochlorothiazide  25 mg Oral Daily  . Influenza vac split quadrivalent PF  0.5 mL Intramuscular Tomorrow-1000  . insulin aspart  0-15 Units Subcutaneous TID WC  . insulin aspart  0-5 Units Subcutaneous QHS  . insulin glargine  35 Units Subcutaneous QHS  . ketorolac  30 mg Intravenous Q6H  . pantoprazole  40 mg Oral BID  . rosuvastatin  5 mg Oral q1800   Continuous Infusions:  PRN Meds:.acetaminophen, albuterol, bisacodyl, morphine injection **OR** morphine injection, nitroGLYCERIN, ondansetron (ZOFRAN) IV, senna-docusate   Data Review:   Micro Results Recent Results (from the past 240 hour(s))  MRSA PCR Screening     Status: None   Collection Time: 01/07/18  1:22  PM  Result Value Ref Range Status   MRSA by PCR NEGATIVE NEGATIVE Final    Comment:        The GeneXpert MRSA Assay (FDA approved for NASAL specimens only), is one component of a comprehensive MRSA colonization surveillance program. It is not intended to diagnose MRSA infection nor to guide or monitor treatment for MRSA infections. Performed at [facility], [address] [place], [place] [address]     Radiology Reports Ct Angio Chest Pe W Or Wo Contrast  Result Date: 01/07/2018 CLINICAL DATA:  Chest pain and shortness of breath this morning. EXAM: CT ANGIOGRAPHY CHEST WITH CONTRAST TECHNIQUE: Multidetector CT imaging of the chest was performed using the standard protocol during bolus administration of intravenous contrast. Multiplanar CT image reconstructions and MIPs were obtained to evaluate the vascular anatomy. CONTRAST:  51mL OMNIPAQUE IOHEXOL 350 MG/ML SOLN COMPARISON:  05/22/2014 FINDINGS: Cardiovascular: The heart is mildly enlarged but stable. No pericardial effusion. There is mild tortuosity of the thoracic aorta but no aneurysm or dissection. Scattered atherosclerotic calcifications. The branch vessels are patent. No obvious coronary artery calcifications. Mild pulmonary artery enlargement suggesting pulmonary hypertension. The pulmonary arterial tree is fairly well opacified and no filling defects are identified to suggest pulmonary embolism. Mediastinum/Nodes: Small scattered mediastinal and hilar lymph nodes but no mass or adenopathy. The esophagus is grossly normal. Lungs/Pleura: No acute pulmonary findings. No infiltrates, edema or effusions. No pulmonary lesions. No bronchiectasis or interstitial lung disease. Upper Abdomen: No significant upper abdominal findings. Musculoskeletal: Stable symmetric bilateral gynecomastia. No supraclavicular or axillary adenopathy. The bony thorax is intact. No bone lesions. Review of the MIP images confirms the above findings.  IMPRESSION: 1. No CT findings for pulmonary embolism. 2. Mildly enlarged pulmonary artery suggesting pulmonary hypertension. 3. No acute pulmonary findings. 4. Stable bilateral gynecomastia. Aortic Atherosclerosis (ICD10-I70.0). Electronically Signed   By: Marijo Sanes M.D.   On: 01/07/2018 14:19   Ct Abdomen Pelvis W Contrast  Result Date: 01/08/2018 CLINICAL DATA:  52 year old male with acute LEFT abdominal and pelvic pain for 1 day. EXAM: CT ABDOMEN AND PELVIS WITH CONTRAST TECHNIQUE: Multidetector  CT imaging of the abdomen and pelvis was performed using the standard protocol following bolus administration of intravenous contrast. CONTRAST:  154mL ISOVUE-300 IOPAMIDOL (ISOVUE-300) INJECTION 61% COMPARISON:  01/10/2017 and prior CTs FINDINGS: Lower chest: No acute abnormality. Bilateral gynecomastia identified, LEFT-greater-than-RIGHT. Hepatobiliary: Hepatic steatosis noted without focal hepatic abnormality. The gallbladder is unremarkable. No biliary dilatation. Pancreas: Unremarkable Spleen: Unremarkable Adrenals/Urinary Tract: The adrenal glands, kidneys and bladder are unremarkable. Stomach/Bowel: Stomach is within normal limits. Appendix appears normal. No evidence of bowel wall thickening, distention, or inflammatory changes. Vascular/Lymphatic: Mild aortic atherosclerosis. No enlarged abdominal or pelvic lymph nodes. Reproductive: Prostate is unremarkable. Other: No ascites, focal collection or pneumoperitoneum. Musculoskeletal: No acute or significant osseous findings. IMPRESSION: 1. No evidence of acute abnormality 2. Hepatic steatosis 3. Gynecomastia, LEFT-greater-than-RIGHT 4.  Aortic Atherosclerosis (ICD10-I70.0). Electronically Signed   By: Margarette Canada M.D.   On: 01/08/2018 13:29   Dg Chest Port 1 View  Result Date: 01/07/2018 CLINICAL DATA:  52 year old male with shortness of breath and chest pain. EXAM: PORTABLE CHEST 1 VIEW COMPARISON:  Chest radiograph dated 07/17/2017 FINDINGS: There is  mild cardiomegaly. No vascular congestion or edema. No focal consolidation, pleural effusion, or pneumothorax. No acute osseous pathology. IMPRESSION: 1. No acute cardiopulmonary process. 2. Mild cardiomegaly. Electronically Signed   By: Anner Crete M.D.   On: 01/07/2018 06:36     CBC Recent Labs  Lab 01/07/18 0554 01/08/18 0317  WBC 9.0 11.3*  HGB 13.8 12.6*  HCT 40.7 37.7*  PLT 271 240  MCV 85.1 85.5  MCH 28.9 28.6  MCHC 33.9 33.4  RDW 13.4 13.7  LYMPHSABS 3.4  --   MONOABS 0.6  --   EOSABS 0.2  --   BASOSABS 0.1  --     Chemistries  Recent Labs  Lab 01/07/18 0554  NA 137  K 3.6  CL 100  CO2 30  GLUCOSE 236*  BUN 15  CREATININE 0.79  CALCIUM 8.8*  MG 1.8  AST 49*  ALT 69*  ALKPHOS 82  BILITOT 0.6   ------------------------------------------------------------------------------------------------------------------ estimated creatinine clearance is 170.7 mL/min (by C-G formula based on SCr of 0.79 mg/dL). ------------------------------------------------------------------------------------------------------------------ No results for input(s): HGBA1C in the last 72 hours. ------------------------------------------------------------------------------------------------------------------ Recent Labs    01/07/18 0554  CHOL 219*  HDL 33*  LDLCALC 138*  TRIG 240*  CHOLHDL 6.6   ------------------------------------------------------------------------------------------------------------------ No results for input(s): TSH, T4TOTAL, T3FREE, THYROIDAB in the last 72 hours.  Invalid input(s): FREET3 ------------------------------------------------------------------------------------------------------------------ No results for input(s): VITAMINB12, FOLATE, FERRITIN, TIBC, IRON, RETICCTPCT in the last 72 hours.  Coagulation profile Recent Labs  Lab 01/07/18 0730  INR 1.05    No results for input(s): DDIMER in the last 72 hours.  Cardiac Enzymes Recent Labs   Lab 01/07/18 0554 01/07/18 1035 01/07/18 1331  TROPONINI <0.03 <0.03 <0.03   ------------------------------------------------------------------------------------------------------------------ Invalid input(s): POCBNP    Assessment & Plan   Patient is 52 year old with chest pain  1.  Chest pain appears to be atypical Discussed the case with Dr. Rockey Situ of cardiology they do not feel that this is cardiac Possible GI related I will obtain a CT scan abdomen We will ask GI to see We will try 3 doses of Toradol Continue Protonix Patient does have gynecomastia will try anti-inflammatory   2.  Diabetes type 2 continue Levemir Continue sliding scale  3.  Hypertension continue current regimen blood pressure stable  4.  GERD continue Protonix      Code Status Orders  (From admission, onward)  Start     Ordered   01/07/18 1322  Full code  Continuous     01/07/18 1321        Code Status History    Date Active Date Inactive Code Status Order ID Comments User Context   06/10/2017 0805 06/11/2017 1621 Full Code [id number]  Harrie Foreman, MD Inpatient   06/11/2016 1104 06/11/2016 1828 Full Code [id number]  Minna Merritts, MD Inpatient   06/08/2016 1522 06/11/2016 1104 Full Code [id number]  Baxter Hire, MD Inpatient           Consults  cards  DVT Prophylaxis  heparin  Lab Results  Component Value Date   PLT 240 01/08/2018     Time Spent in minutes 35 minutes  Greater than 50% of time spent in care coordination and counseling patient regarding the condition and plan of care.   Dustin Flock M.D on 01/08/2018 at 1:33 PM  Between 7am to 6pm - Pager - [phone]  After 6pm go to [email or web address] - Proofreader  [employer]   Office  [phone]

## 2018-01-09 LAB — GLUCOSE, CAPILLARY: Glucose-Capillary: 121 mg/dL — ABNORMAL HIGH (ref 70–99)

## 2018-01-09 MED ORDER — NITROGLYCERIN 0.4 MG SL SUBL: 0.4000 mg | SUBLINGUAL_TABLET | SUBLINGUAL | 12 refills | Status: DC | PRN

## 2018-01-09 MED ORDER — ROSUVASTATIN CALCIUM 5 MG PO TABS: 5.0000 mg | ORAL_TABLET | Freq: Every day | ORAL | 0 refills | Status: DC

## 2018-01-09 MED ORDER — PANTOPRAZOLE SODIUM 40 MG PO TBEC: 40.0000 mg | DELAYED_RELEASE_TABLET | Freq: Two times a day (BID) | ORAL | 4 refills | Status: DC

## 2018-01-09 MED ORDER — ASPIRIN 81 MG PO TBEC: 81.0000 mg | DELAYED_RELEASE_TABLET | Freq: Every day | ORAL | 0 refills | Status: DC

## 2018-01-09 MED ORDER — CARVEDILOL 3.125 MG PO TABS: 3.1250 mg | ORAL_TABLET | Freq: Two times a day (BID) | ORAL | 0 refills | Status: DC

## 2018-01-09 NOTE — Discharge Summary (Signed)
[place] at [place] NAME: Gerald [name]    MR#:  [id number]  DATE OF BIRTH:  05/23/1965  DATE OF ADMISSION:  01/07/2018 ADMITTING PHYSICIAN: Dustin Flock, MD  DATE OF DISCHARGE: 01/09/2018  PRIMARY CARE PHYSICIAN: Ronnell Freshwater, NP    ADMISSION DIAGNOSIS:  Unstable angina ([place]) [I20.0]  DISCHARGE DIAGNOSIS:  chest pain appears atypical. chronic abdominal pain suspected due to acid reflux/history of peptic ulcer disease  SECONDARY DIAGNOSIS:   Past Medical History:  Diagnosis Date  . Abnormal LFTs   . Diabetes mellitus without complication (HCC)    borderline  . Fundic gland polyps of stomach, benign   . Gastritis   . GERD (gastroesophageal reflux disease)   . Hepatic steatosis   . Hyperlipidemia   . Hypertension   . Nonischemic cardiomyopathy (HCC)    Previous ejection fraction of 35-40% on echo. 50% on cardiac catheterization in August of 2013  . Obesity   . Sleep apnea     HOSPITAL COURSE:  52 year old with chest pain  1.  Chest pain appears to be atypical -PEr Dr. Rockey Situ of cardiology they do not feel that this is cardiac -Possible GI related --CT abdomen essentially nothing acute -- evaluated by G.I. Recommends barium swallow. Patient had EGD in May 2019 that showed superficial gastric ulcers. It does not appear patient is taking his Protonix on a regular basis. I recommended he take Protonix 40 BID and follow-up with G.I. as outpatient. -Dr. Marius Ditch okay not to do barium swallow. Patient is tolerating diet. -Continue Protonix -troponin x 2 negative   2.Diabetes type 2 continue Levemir Continue sliding scale  3.  Hypertension continue current regimen blood pressure stable  4.  GERD continue Protonix  5 Morbid obesity. Diet exercise and lifestyle changes discussed with patient  Overall hemodynamically stable. Will discharge to home with outpatient follow-up with primary care and cardiology.  Patient will follow-up with G.I. if his symptoms continue.  CONSULTS OBTAINED:  Treatment Team:  Arta Silence, MD Efrain Sella, MD Lin Landsman, MD  DRUG ALLERGIES:   Allergies  Allergen Reactions  . Statins Rash         DISCHARGE MEDICATIONS:   Allergies as of 01/09/2018      Reactions   Statins Rash         Medication List    STOP taking these medications   amoxicillin-clavulanate 875-125 MG tablet Commonly known as:  AUGMENTIN   chlorpheniramine-HYDROcodone 10-8 MG/5ML Suer Commonly known as:  TUSSIONEX     TAKE these medications   acetaminophen 325 MG tablet Commonly known as:  TYLENOL Take 325-650 mg by mouth every 6 (six) hours as needed for mild pain or headache.   albuterol 108 (90 Base) MCG/ACT inhaler Commonly known as:  PROVENTIL HFA;VENTOLIN HFA Inhale 2 puffs into the lungs every 6 (six) hours as needed for wheezing or shortness of breath.   aspirin 81 MG EC tablet Take 1 tablet (81 mg total) by mouth daily. Start taking on:  01/10/2018   carvedilol 3.125 MG tablet Commonly known as:  COREG Take 1 tablet (3.125 mg total) by mouth 2 (two) times daily with a meal.   citalopram 40 MG tablet Commonly known as:  CELEXA TAKE 1 TABLET BY MOUTH EVERY DAY FOR GAD   Dulaglutide 1.5 MG/0.5ML Sopn Inject 1.5 mg into the skin every Wednesday.   Insulin Detemir 100 UNIT/ML Pen Commonly known as:  LEVEMIR Inject 50units [place] daily. What  changed:    how much to take  how to take this  when to take this   lisinopril-hydrochlorothiazide 20-25 MG tablet Commonly known as:  PRINZIDE,ZESTORETIC Take 1 tablet by mouth daily.   nitroGLYCERIN 0.4 MG SL tablet Commonly known as:  NITROSTAT Place 1 tablet (0.4 mg total) under the tongue every 5 (five) minutes x 3 doses as needed for chest pain.   pantoprazole 40 MG tablet Commonly known as:  PROTONIX Take 1 tablet (40 mg total) by mouth 2 (two) times daily before a meal.    rosuvastatin 5 MG tablet Commonly known as:  CRESTOR Take 1 tablet (5 mg total) by mouth daily at 6 PM.       If you experience worsening of your admission symptoms, develop shortness of breath, life threatening emergency, suicidal or homicidal thoughts you must seek medical attention immediately by calling 911 or calling your MD immediately  if symptoms less severe.  You Must read complete instructions/literature along with all the possible adverse reactions/side effects for all the Medicines you take and that have been prescribed to you. Take any new Medicines after you have completely understood and accept all the possible adverse reactions/side effects.   Please note  You were cared for by a hospitalist during your hospital stay. If you have any questions about your discharge medications or the care you received while you were in the hospital after you are discharged, you can call the unit and asked to speak with the hospitalist on call if the hospitalist that took care of you is not available. Once you are discharged, your primary care physician will handle any further medical issues. Please note that NO REFILLS for any discharge medications will be authorized once you are discharged, as it is imperative that you return to your primary care physician (or establish a relationship with a primary care physician if you do not have one) for your aftercare needs so that they can reassess your need for medications and monitor your lab values. Today   SUBJECTIVE   Doing okay today. Tolerating diet.  VITAL SIGNS:  Blood pressure (!) 145/79, pulse 69, temperature 98.6 F (37 C), temperature source Oral, resp. rate 16, height 6\' 2"  (1.88 m), weight (!) 151.8 kg, SpO2 93 %.  I/O:    Intake/Output Summary (Last 24 hours) at 01/09/2018 1154 Last data filed at 01/08/2018 1300 Gross per 24 hour  Intake -  Output 700 ml  Net -700 ml    PHYSICAL EXAMINATION:  GENERAL:  52 y.o.-year-old patient  lying in the bed with no acute distress. Morbidly obese EYES: Pupils equal, round, reactive to light and accommodation. No scleral icterus. Extraocular muscles intact.  HEENT: Head atraumatic, normocephalic. Oropharynx and nasopharynx clear.  NECK:  Supple, no jugular venous distention. No thyroid enlargement, no tenderness.  LUNGS: Normal breath sounds bilaterally, no wheezing, rales,rhonchi or crepitation. No use of accessory muscles of respiration.  CARDIOVASCULAR: S1, S2 normal. No murmurs, rubs, or gallops.  ABDOMEN: Soft, non-tender, non-distended. Bowel sounds present. No organomegaly or mass.  EXTREMITIES: No pedal edema, cyanosis, or clubbing.  NEUROLOGIC: Cranial nerves II through XII are intact. Muscle strength 5/5 in all extremities. Sensation intact. Gait not checked.  PSYCHIATRIC: The patient is alert and oriented x 3.  SKIN: No obvious rash, lesion, or ulcer.   DATA REVIEW:   CBC  Recent Labs  Lab 01/08/18 0317  WBC 11.3*  HGB 12.6*  HCT 37.7*  PLT 240    Chemistries  Recent Labs  Lab 01/07/18 0554 01/08/18 1306  NA 137  --   K 3.6  --   CL 100  --   CO2 30  --   GLUCOSE 236*  --   BUN 15  --   CREATININE 0.79 0.90  CALCIUM 8.8*  --   MG 1.8  --   AST 49*  --   ALT 69*  --   ALKPHOS 82  --   BILITOT 0.6  --     Microbiology Results   Recent Results (from the past 240 hour(s))  MRSA PCR Screening     Status: None   Collection Time: 01/07/18  1:22 PM  Result Value Ref Range Status   MRSA by PCR NEGATIVE NEGATIVE Final    Comment:        The GeneXpert MRSA Assay (FDA approved for NASAL specimens only), is one component of a comprehensive MRSA colonization surveillance program. It is not intended to diagnose MRSA infection nor to guide or monitor treatment for MRSA infections. Performed at [facility], [address] [place], [place] [address]     RADIOLOGY:  Ct Angio Chest Pe W Or Wo Contrast  Result Date: 01/07/2018 CLINICAL  DATA:  Chest pain and shortness of breath this morning. EXAM: CT ANGIOGRAPHY CHEST WITH CONTRAST TECHNIQUE: Multidetector CT imaging of the chest was performed using the standard protocol during bolus administration of intravenous contrast. Multiplanar CT image reconstructions and MIPs were obtained to evaluate the vascular anatomy. CONTRAST:  107mL OMNIPAQUE IOHEXOL 350 MG/ML SOLN COMPARISON:  05/22/2014 FINDINGS: Cardiovascular: The heart is mildly enlarged but stable. No pericardial effusion. There is mild tortuosity of the thoracic aorta but no aneurysm or dissection. Scattered atherosclerotic calcifications. The branch vessels are patent. No obvious coronary artery calcifications. Mild pulmonary artery enlargement suggesting pulmonary hypertension. The pulmonary arterial tree is fairly well opacified and no filling defects are identified to suggest pulmonary embolism. Mediastinum/Nodes: Small scattered mediastinal and hilar lymph nodes but no mass or adenopathy. The esophagus is grossly normal. Lungs/Pleura: No acute pulmonary findings. No infiltrates, edema or effusions. No pulmonary lesions. No bronchiectasis or interstitial lung disease. Upper Abdomen: No significant upper abdominal findings. Musculoskeletal: Stable symmetric bilateral gynecomastia. No supraclavicular or axillary adenopathy. The bony thorax is intact. No bone lesions. Review of the MIP images confirms the above findings. IMPRESSION: 1. No CT findings for pulmonary embolism. 2. Mildly enlarged pulmonary artery suggesting pulmonary hypertension. 3. No acute pulmonary findings. 4. Stable bilateral gynecomastia. Aortic Atherosclerosis (ICD10-I70.0). Electronically Signed   By: Marijo Sanes M.D.   On: 01/07/2018 14:19   Ct Abdomen Pelvis W Contrast  Result Date: 01/08/2018 CLINICAL DATA:  52 year old male with acute LEFT abdominal and pelvic pain for 1 day. EXAM: CT ABDOMEN AND PELVIS WITH CONTRAST TECHNIQUE: Multidetector CT imaging of the  abdomen and pelvis was performed using the standard protocol following bolus administration of intravenous contrast. CONTRAST:  136mL ISOVUE-300 IOPAMIDOL (ISOVUE-300) INJECTION 61% COMPARISON:  01/10/2017 and prior CTs FINDINGS: Lower chest: No acute abnormality. Bilateral gynecomastia identified, LEFT-greater-than-RIGHT. Hepatobiliary: Hepatic steatosis noted without focal hepatic abnormality. The gallbladder is unremarkable. No biliary dilatation. Pancreas: Unremarkable Spleen: Unremarkable Adrenals/Urinary Tract: The adrenal glands, kidneys and bladder are unremarkable. Stomach/Bowel: Stomach is within normal limits. Appendix appears normal. No evidence of bowel wall thickening, distention, or inflammatory changes. Vascular/Lymphatic: Mild aortic atherosclerosis. No enlarged abdominal or pelvic lymph nodes. Reproductive: Prostate is unremarkable. Other: No ascites, focal collection or pneumoperitoneum. Musculoskeletal: No acute or significant osseous  findings. IMPRESSION: 1. No evidence of acute abnormality 2. Hepatic steatosis 3. Gynecomastia, LEFT-greater-than-RIGHT 4.  Aortic Atherosclerosis (ICD10-I70.0). Electronically Signed   By: Margarette Canada M.D.   On: 01/08/2018 13:29     Management plans discussed with the patient, family and they are in agreement.  CODE STATUS:     Code Status Orders  (From admission, onward)         Start     Ordered   01/07/18 1322  Full code  Continuous     01/07/18 1321        Code Status History    Date Active Date Inactive Code Status Order ID Comments User Context   06/10/2017 0805 06/11/2017 1621 Full Code [id number]  Harrie Foreman, MD Inpatient   06/11/2016 1104 06/11/2016 1828 Full Code [id number]  Minna Merritts, MD Inpatient   06/08/2016 1522 06/11/2016 1104 Full Code [id number]  Baxter Hire, MD Inpatient      TOTAL TIME TAKING CARE OF THIS PATIENT: *40* minutes.    Fritzi Mandes M.D on 01/09/2018 at 11:54 AM  Between 7am to 6pm - Pager -  [phone] After 6pm go to [email or web address] - password [email or web address] [facility]  Office  [phone]  CC: Primary care physician; Ronnell Freshwater, NP

## 2018-01-12 ENCOUNTER — Ambulatory Visit: Payer: Self-pay | Admitting: Adult Health

## 2018-01-12 ENCOUNTER — Ambulatory Visit: Payer: 59 | Admitting: Adult Health

## 2018-01-12 ENCOUNTER — Other Ambulatory Visit: Payer: Self-pay | Admitting: Adult Health

## 2018-01-12 ENCOUNTER — Encounter: Payer: Self-pay | Admitting: Adult Health

## 2018-01-12 VITALS — BP 152/88 | HR 98 | Temp 98.6°F | Resp 18 | Ht 74.0 in | Wt 337.0 lb

## 2018-01-12 DIAGNOSIS — J029 Acute pharyngitis, unspecified: Secondary | ICD-10-CM

## 2018-01-12 DIAGNOSIS — E1165 Type 2 diabetes mellitus with hyperglycemia: Secondary | ICD-10-CM

## 2018-01-12 DIAGNOSIS — R05 Cough: Secondary | ICD-10-CM

## 2018-01-12 DIAGNOSIS — R059 Cough, unspecified: Secondary | ICD-10-CM

## 2018-01-12 DIAGNOSIS — I10 Essential (primary) hypertension: Secondary | ICD-10-CM

## 2018-01-12 DIAGNOSIS — E782 Mixed hyperlipidemia: Secondary | ICD-10-CM

## 2018-01-12 DIAGNOSIS — J0110 Acute frontal sinusitis, unspecified: Secondary | ICD-10-CM | POA: Diagnosis not present

## 2018-01-12 LAB — POCT GLYCOSYLATED HEMOGLOBIN (HGB A1C): Hemoglobin A1C: 8.5 % — AB (ref 4.0–5.6)

## 2018-01-12 MED ORDER — ALBUTEROL SULFATE HFA 108 (90 BASE) MCG/ACT IN AERS: 2.0000 | INHALATION_SPRAY | Freq: Four times a day (QID) | RESPIRATORY_TRACT | 0 refills | Status: DC | PRN

## 2018-01-12 MED ORDER — AMOXICILLIN-POT CLAVULANATE 875-125 MG PO TABS: 1.0000 | ORAL_TABLET | Freq: Two times a day (BID) | ORAL | 0 refills | Status: DC

## 2018-01-12 MED ORDER — ALBUTEROL SULFATE HFA 108 (90 BASE) MCG/ACT IN AERS: 2.0000 | INHALATION_SPRAY | Freq: Four times a day (QID) | RESPIRATORY_TRACT | 1 refills | Status: DC | PRN

## 2018-01-12 MED ORDER — PHENYLEPH-CHLORPHEN-CODEINE 5-2-10 MG/5ML PO SYRP: 5.0000 mL | ORAL_SOLUTION | Freq: Three times a day (TID) | ORAL | 0 refills | Status: DC

## 2018-01-12 NOTE — Progress Notes (Signed)
[facility] [place], [place] [address]  Internal MEDICINE  Office Visit Note  Patient Name: Gerald [name]  [id number]  [id number]  Date of Service: 01/12/2018  Chief Complaint  Patient presents with  . Diabetes  . Hypertension  . Hyperlipidemia  . Cough    Patient was in hospital thursday in icu , let go home on Saturday. Sharp pains in left side and swelling on left hand  . Shortness of Breath  . Quality Metric Gaps    diabetic foot exam     HPI Pt is here for follow up on DM, HTN, HLD. He is also reporting cough and sinus congestion.  He reports he thought he was having a heart attack, and went to the ER.  They kept him overnight in the ICU and discharged him home the next day. His cardiac work up was negative.  He now has sinus congestion, and sore throat. He also reports PND that is causing him to cough.           Current Medication: Outpatient Encounter Medications as of 01/12/2018  Medication Sig  . acetaminophen (TYLENOL) 325 MG tablet Take 325-650 mg by mouth every 6 (six) hours as needed for mild pain or headache.  Marland Kitchen aspirin 81 MG EC tablet Take 1 tablet (81 mg total) by mouth daily.  . carvedilol (COREG) 3.125 MG tablet Take 1 tablet (3.125 mg total) by mouth 2 (two) times daily with a meal.  . citalopram (CELEXA) 40 MG tablet TAKE 1 TABLET BY MOUTH EVERY DAY FOR GAD  . Dulaglutide (TRULICITY) 1.5 FG/1.8EX SOPN Inject 1.5 mg into the skin every Wednesday.  . Insulin Detemir (LEVEMIR FLEXTOUCH) 100 UNIT/ML Pen Inject 50units [place] daily. (Patient taking differently: Inject 50 Units into the skin every evening. Inject 50units [place] daily.)  . lisinopril-hydrochlorothiazide (PRINZIDE,ZESTORETIC) 20-25 MG tablet Take 1 tablet by mouth daily.  . nitroGLYCERIN (NITROSTAT) 0.4 MG SL tablet Place 1 tablet (0.4 mg total) under the tongue every 5 (five) minutes x 3 doses as needed for chest pain.  . pantoprazole (PROTONIX) 40 MG tablet Take 1 tablet (40  mg total) by mouth 2 (two) times daily before a meal.  . rosuvastatin (CRESTOR) 5 MG tablet Take 1 tablet (5 mg total) by mouth daily at 6 PM.  . [DISCONTINUED] albuterol (PROVENTIL HFA;VENTOLIN HFA) 108 (90 Base) MCG/ACT inhaler Inhale 2 puffs into the lungs every 6 (six) hours as needed for wheezing or shortness of breath.  . [DISCONTINUED] albuterol (PROVENTIL HFA;VENTOLIN HFA) 108 (90 Base) MCG/ACT inhaler Inhale 2 puffs into the lungs every 6 (six) hours as needed for wheezing or shortness of breath.  Marland Kitchen amoxicillin-clavulanate (AUGMENTIN) 875-125 MG tablet Take 1 tablet by mouth 2 (two) times daily.  Marland Kitchen Phenyleph-Chlorphen-Codeine 06-04-08 MG/5ML SYRP Take 5 mLs by mouth 3 (three) times daily.   No facility-administered encounter medications on file as of 01/12/2018.     Surgical History: Past Surgical History:  Procedure Laterality Date  . CARDIAC CATHETERIZATION N/A 09/2011   [facility]; EF 50% with 30% mid LAD stenosis and no obstructive disease.  Marland Kitchen CARDIAC CATHETERIZATION  09/2010   [facility]; Mid LAD 40% stenosis; Mid Circumflex:Normal; Mid RCA; Normal  . CARDIAC CATHETERIZATION    . COLONOSCOPY    . ESOPHAGOGASTRODUODENOSCOPY (EGD) WITH PROPOFOL N/A 06/10/2017   Procedure: ESOPHAGOGASTRODUODENOSCOPY (EGD) WITH PROPOFOL;  Surgeon: Jonathon Bellows, MD;  Location: [facility] ENDOSCOPY;  Service: Gastroenterology;  Laterality: N/A;  . KNEE ARTHROSCOPY WITH MEDIAL MENISECTOMY Right 09/16/2012  Procedure: RIGHT KNEE ARTHROSCOPY WITH MEDIAL AND LATERAL MENISECTOMY, CHONDROPLASTY;  Surgeon: Ninetta Lights, MD;  Location: [place];  Service: Orthopedics;  Laterality: Right;  RIGHT KNEE SCOPE MEDIAL MENISCECTOMY  . LEFT HEART CATH AND CORONARY ANGIOGRAPHY N/A 06/11/2016   Procedure: Left Heart Cath and Coronary Angiography;  Surgeon: Minna Merritts, MD;  Location: [place] CV LAB;  Service: Cardiovascular;  Laterality: N/A;    Medical History: Past Medical History:  Diagnosis Date  .  Abnormal LFTs   . Diabetes mellitus without complication (HCC)    borderline  . Fundic gland polyps of stomach, benign   . Gastritis   . GERD (gastroesophageal reflux disease)   . Hepatic steatosis   . Hyperlipidemia   . Hypertension   . Nonischemic cardiomyopathy (HCC)    Previous ejection fraction of 35-40% on echo. 50% on cardiac catheterization in August of 2013  . Obesity   . Sleep apnea     Family History: Family History  Problem Relation Age of Onset  . Heart disease Father 57       MI  . Heart attack Father   . Stomach cancer Father   . Hypertension Mother   . Breast cancer Mother     Social History   Socioeconomic History  . Marital status: Married    Spouse name: Not on file  . Number of children: 3  . Years of education: Not on file  . Highest education level: Not on file  Occupational History  . Occupation: Librarian, academic  Social Needs  . Financial resource strain: Not on file  . Food insecurity:    Worry: Not on file    Inability: Not on file  . Transportation needs:    Medical: Not on file    Non-medical: Not on file  Tobacco Use  . Smoking status: Never Smoker  . Smokeless tobacco: Never Used  Substance and Sexual Activity  . Alcohol use: Not Currently    Alcohol/week: 0.0 standard drinks    Comment: rare  . Drug use: No  . Sexual activity: Not on file  Lifestyle  . Physical activity:    Days per week: Not on file    Minutes per session: Not on file  . Stress: Not on file  Relationships  . Social connections:    Talks on phone: Not on file    Gets together: Not on file    Attends religious service: Not on file    Active member of club or organization: Not on file    Attends meetings of clubs or organizations: Not on file    Relationship status: Not on file  . Intimate partner violence:    Fear of current or ex partner: Not on file    Emotionally abused: Not on file    Physically abused: Not on file    Forced sexual activity: Not  on file  Other Topics Concern  . Not on file  Social History Narrative   Married.  60 yo son.   Wife on disability for bipolar disorder.        Review of Systems  Constitutional: Negative.  Negative for chills, fatigue and unexpected weight change.  HENT: Negative.  Negative for congestion, rhinorrhea, sneezing and sore throat.   Eyes: Negative for redness.  Respiratory: Negative.  Negative for cough, chest tightness and shortness of breath.   Cardiovascular: Negative.  Negative for chest pain and palpitations.  Gastrointestinal: Negative.  Negative for abdominal pain, constipation,  diarrhea, nausea and vomiting.  Endocrine: Negative.   Genitourinary: Negative.  Negative for dysuria and frequency.  Musculoskeletal: Negative.  Negative for arthralgias, back pain, joint swelling and neck pain.  Skin: Negative.  Negative for rash.  Allergic/Immunologic: Negative.   Neurological: Negative.  Negative for tremors and numbness.  Hematological: Negative for adenopathy. Does not bruise/bleed easily.  Psychiatric/Behavioral: Negative.  Negative for behavioral problems, sleep disturbance and suicidal ideas. The patient is not nervous/anxious.     Vital Signs: BP (!) 152/88 (BP Location: Left Arm, Patient Position: Sitting, Cuff Size: Normal)   Pulse 98   Temp 98.6 F (37 C) (Oral)   Resp 18   Ht 6\' 2"  (1.88 m)   Wt (!) 337 lb (152.9 kg)   SpO2 99%   BMI 43.27 kg/m    Physical Exam  Constitutional: He is oriented to person, place, and time. He appears well-developed and well-nourished. No distress.  HENT:  Head: Normocephalic and atraumatic.  Mouth/Throat: Oropharynx is clear and moist. No oropharyngeal exudate.  Eyes: Pupils are equal, round, and reactive to light. EOM are normal.  Neck: Normal range of motion. Neck supple. No JVD present. No tracheal deviation present. No thyromegaly present.  Cardiovascular: Normal rate, regular rhythm and normal heart sounds. Exam reveals no  gallop and no friction rub.  No murmur heard. Pulmonary/Chest: Effort normal and breath sounds normal. No respiratory distress. He has no wheezes. He has no rales. He exhibits no tenderness.  Abdominal: Soft. There is no tenderness. There is no guarding.  Musculoskeletal: Normal range of motion.  Lymphadenopathy:    He has no cervical adenopathy.  Neurological: He is alert and oriented to person, place, and time. No cranial nerve deficit.  Skin: Skin is warm and dry. He is not diaphoretic.  Psychiatric: He has a normal mood and affect. His behavior is normal. Judgment and thought content normal.  Nursing note and vitals reviewed.  Assessment/Plan: 1. Acute non-recurrent frontal sinusitis We will treat with a course of Augmentin.  Patient started to return to clinic go to emergency department if symptoms do not resolve in the next 7 to 10 days.  Also instructed that if he develops a fever or has any new concerning or worsening symptoms he should return to clinic. - amoxicillin-clavulanate (AUGMENTIN) 875-125 MG tablet; Take 1 tablet by mouth 2 (two) times daily.  Dispense: 14 tablet; Refill: 0  2. Uncontrolled type 2 diabetes mellitus with hyperglycemia ([place]) Patient current A1c is 8.5.  Patient declined to change any of his occasions at this time stating that he has been sick and not feeling well and that he thinks his A1c will be better when he is feeling better. - POCT HgB A1C  3. Cough Patient taken out of work for a few days and given codeine cough syrup to help him rest at night.  When patient about drinking alcohol or taking any other medications with narcotics. - Phenyleph-Chlorphen-Codeine 06-04-08 MG/5ML SYRP; Take 5 mLs by mouth 3 (three) times daily.  Dispense: 100 mL; Refill: 0 Reviewed risks and possible side effects associated with taking opiates, benzodiazepines and other CNS depressants. Combination of these could cause dizziness and drowsiness. Advised patient not to drive or  operate machinery when taking these medications, as patient's and other's life can be at risk and will have consequences. Patient verbalized understanding in this matter. Dependence and abuse for these drugs will be monitored closely. A Controlled substance policy and procedure is on file which allows [facility] medical  associates to order a urine drug screen test at any visit. Patient understands and agrees with the plan  4. Sore throat Patient encouraged to gargle salt water and drink hot tea with honey.  He also is encouraged to use over-the-counter medications for sore throat and cough.  5. Essential hypertension Pressure slightly elevated today most likely due to illness.  We will continue to follow in the future.  6. Mixed hyperlipidemia Stable, continue current medications.  General Counseling: axcel horsch understanding of the findings of todays visit and agrees with plan of treatment. I have discussed any further diagnostic evaluation that may be needed or ordered today. We also reviewed his medications today. he has been encouraged to call the office with any questions or concerns that should arise related to todays visit.    Orders Placed This Encounter  Procedures  . POCT HgB A1C    Meds ordered this encounter  Medications  . DISCONTD: albuterol (PROVENTIL HFA;VENTOLIN HFA) 108 (90 Base) MCG/ACT inhaler    Sig: Inhale 2 puffs into the lungs every 6 (six) hours as needed for wheezing or shortness of breath.    Dispense:  1 Inhaler    Refill:  0  . Phenyleph-Chlorphen-Codeine 06-04-08 MG/5ML SYRP    Sig: Take 5 mLs by mouth 3 (three) times daily.    Dispense:  100 mL    Refill:  0  . amoxicillin-clavulanate (AUGMENTIN) 875-125 MG tablet    Sig: Take 1 tablet by mouth 2 (two) times daily.    Dispense:  14 tablet    Refill:  0    Time spent: 25 Minutes   This patient was seen by Orson Gear AGNP-C in Collaboration with Dr Lavera Guise as a part of collaborative care  agreement     Kendell Bane AGNP-C Internal medicine

## 2018-01-13 ENCOUNTER — Other Ambulatory Visit: Payer: Self-pay | Admitting: Adult Health

## 2018-01-13 ENCOUNTER — Telehealth: Payer: Self-pay | Admitting: Adult Health

## 2018-01-13 DIAGNOSIS — R059 Cough, unspecified: Secondary | ICD-10-CM

## 2018-01-13 DIAGNOSIS — R05 Cough: Secondary | ICD-10-CM

## 2018-01-13 MED ORDER — GUAIFENESIN-CODEINE 100-10 MG/5ML PO SYRP: 5.0000 mL | ORAL_SOLUTION | Freq: Three times a day (TID) | ORAL | 0 refills | Status: DC | PRN

## 2018-01-13 NOTE — Telephone Encounter (Signed)
Done

## 2018-01-13 NOTE — Progress Notes (Signed)
Cough syrup origionally sent is discontinued.  Send Guifenasin with codeine.

## 2018-01-13 NOTE — Telephone Encounter (Signed)
Phenyleph-Chlorphen-Codeine 06-04-08 MG/5ML SYRP, this medication has been discontinued , please send in another medication

## 2018-01-21 ENCOUNTER — Telehealth: Payer: Self-pay

## 2018-01-21 NOTE — Telephone Encounter (Signed)
Patient dropped off FMLA paperwork for provider to complete.Gerald [name]

## 2018-01-28 DIAGNOSIS — G4733 Obstructive sleep apnea (adult) (pediatric): Secondary | ICD-10-CM | POA: Diagnosis not present

## 2018-02-09 ENCOUNTER — Telehealth: Payer: Self-pay

## 2018-02-09 NOTE — Telephone Encounter (Signed)
Patient FMLA paperwork is ready for pick up. Gerald [name]

## 2018-02-27 DIAGNOSIS — G4733 Obstructive sleep apnea (adult) (pediatric): Secondary | ICD-10-CM | POA: Diagnosis not present

## 2018-03-02 ENCOUNTER — Other Ambulatory Visit: Payer: Self-pay

## 2018-03-02 DIAGNOSIS — I10 Essential (primary) hypertension: Secondary | ICD-10-CM

## 2018-03-02 MED ORDER — LISINOPRIL-HYDROCHLOROTHIAZIDE 20-25 MG PO TABS: 1.0000 | ORAL_TABLET | Freq: Every day | ORAL | 3 refills | Status: DC

## 2018-03-30 ENCOUNTER — Ambulatory Visit: Payer: 59 | Admitting: Internal Medicine

## 2018-03-30 ENCOUNTER — Encounter: Payer: Self-pay | Admitting: Internal Medicine

## 2018-03-30 VITALS — BP 152/85 | HR 87 | Temp 99.3°F | Resp 16 | Ht 74.0 in | Wt 338.6 lb

## 2018-03-30 DIAGNOSIS — J029 Acute pharyngitis, unspecified: Secondary | ICD-10-CM

## 2018-03-30 DIAGNOSIS — G4733 Obstructive sleep apnea (adult) (pediatric): Secondary | ICD-10-CM | POA: Diagnosis not present

## 2018-03-30 DIAGNOSIS — R05 Cough: Secondary | ICD-10-CM

## 2018-03-30 DIAGNOSIS — R509 Fever, unspecified: Secondary | ICD-10-CM | POA: Diagnosis not present

## 2018-03-30 DIAGNOSIS — R059 Cough, unspecified: Secondary | ICD-10-CM

## 2018-03-30 LAB — POC INFLUENZA TEST
Negative: NEGATIVE
Positive: NEGATIVE

## 2018-03-30 MED ORDER — AZITHROMYCIN 250 MG PO TABS: ORAL_TABLET | ORAL | 0 refills | Status: DC

## 2018-03-30 MED ORDER — OSELTAMIVIR PHOSPHATE 75 MG PO CAPS: 75.0000 mg | ORAL_CAPSULE | Freq: Two times a day (BID) | ORAL | 0 refills | Status: DC

## 2018-03-30 NOTE — Progress Notes (Signed)
[facility] [place], [place] [address]  Internal MEDICINE  Office Visit Note  Patient Name: Gerald [name]  [id number]  [id number]  Date of Service: 04/08/2018  Chief Complaint  Patient presents with  . Cough    possible flu, used OTC medications and didnt work  . Sore Throat    started a week ago, pt has been around family that is sick  . Headache    severe  . Chills  . Fever  . Nasal Congestion    HPI Pt is here for a sick visit. Fever and chills. Cough is dry, body aches with head congestion  Pressure in the head. Current Medication:  Outpatient Encounter Medications as of 03/30/2018  Medication Sig  . acetaminophen (TYLENOL) 325 MG tablet Take 325-650 mg by mouth every 6 (six) hours as needed for mild pain or headache.  . albuterol (PROVENTIL HFA;VENTOLIN HFA) 108 (90 Base) MCG/ACT inhaler Inhale 2 puffs into the lungs every 6 (six) hours as needed for wheezing or shortness of breath.  Marland Kitchen aspirin 81 MG EC tablet Take 1 tablet (81 mg total) by mouth daily.  . carvedilol (COREG) 3.125 MG tablet Take 1 tablet (3.125 mg total) by mouth 2 (two) times daily with a meal.  . citalopram (CELEXA) 40 MG tablet TAKE 1 TABLET BY MOUTH EVERY DAY FOR GAD  . Dulaglutide (TRULICITY) 1.5 IZ/1.2WP SOPN Inject 1.5 mg into the skin every Wednesday.  Marland Kitchen guaiFENesin-codeine (ROBITUSSIN AC) 100-10 MG/5ML syrup Take 5 mLs by mouth 3 (three) times daily as needed for cough.  . Insulin Detemir (LEVEMIR FLEXTOUCH) 100 UNIT/ML Pen Inject 50units [place] daily. (Patient taking differently: Inject 50 Units into the skin every evening. Inject 50units [place] daily.)  . lisinopril-hydrochlorothiazide (PRINZIDE,ZESTORETIC) 20-25 MG tablet Take 1 tablet by mouth daily.  . nitroGLYCERIN (NITROSTAT) 0.4 MG SL tablet Place 1 tablet (0.4 mg total) under the tongue every 5 (five) minutes x 3 doses as needed for chest pain.  . pantoprazole (PROTONIX) 40 MG tablet Take 1 tablet (40 mg total) by  mouth 2 (two) times daily before a meal.  . Phenyleph-Chlorphen-Codeine 06-04-08 MG/5ML SYRP Take 5 mLs by mouth 3 (three) times daily.  . rosuvastatin (CRESTOR) 5 MG tablet Take 1 tablet (5 mg total) by mouth daily at 6 PM.  . amoxicillin-clavulanate (AUGMENTIN) 875-125 MG tablet Take 1 tablet by mouth 2 (two) times daily. (Patient not taking: Reported on 03/30/2018)  . azithromycin (ZITHROMAX) 250 MG tablet Use as directed  . oseltamivir (TAMIFLU) 75 MG capsule Take 1 capsule (75 mg total) by mouth 2 (two) times daily.   No facility-administered encounter medications on file as of 03/30/2018.     Medical History: Past Medical History:  Diagnosis Date  . Abnormal LFTs   . Diabetes mellitus without complication (HCC)    borderline  . Fundic gland polyps of stomach, benign   . Gastritis   . GERD (gastroesophageal reflux disease)   . Hepatic steatosis   . Hyperlipidemia   . Hypertension   . Nonischemic cardiomyopathy (HCC)    Previous ejection fraction of 35-40% on echo. 50% on cardiac catheterization in August of 2013  . Obesity   . Sleep apnea      Vital Signs: BP (!) 152/85 (BP Location: Left Arm, Patient Position: Sitting, Cuff Size: Large)   Pulse 87   Temp 99.3 F (37.4 C)   Resp 16   Ht 6\' 2"  (1.88 m)   Wt (!) 338 lb 9.6  oz (153.6 kg)   SpO2 92%   BMI 43.47 kg/m    Review of Systems  Constitutional: Positive for fatigue. Negative for chills and unexpected weight change.  HENT: Positive for postnasal drip and sore throat. Negative for congestion, rhinorrhea and sneezing.   Eyes: Positive for itching and visual disturbance. Negative for redness.  Respiratory: Positive for cough. Negative for chest tightness and shortness of breath.   Cardiovascular: Negative for chest pain and palpitations.  Gastrointestinal: Negative for abdominal pain, diarrhea, nausea and vomiting.  Musculoskeletal: Positive for arthralgias. Negative for neck pain.  Skin: Negative for rash.   Neurological: Negative.  Negative for tremors and numbness.  Hematological: Negative for adenopathy. Does not bruise/bleed easily.  Psychiatric/Behavioral: Negative for sleep disturbance and suicidal ideas. Behavioral problem: Depression. The patient is nervous/anxious.     Physical Exam Vitals signs and nursing note reviewed.  Constitutional:      Appearance: He is well-developed.  HENT:     Head: Normocephalic.     Right Ear: Tympanic membrane normal.     Left Ear: Tympanic membrane normal.     Nose: Congestion present.     Mouth/Throat:     Mouth: Mucous membranes are moist.  Neck:     Musculoskeletal: Normal range of motion.  Cardiovascular:     Rate and Rhythm: Tachycardia present.  Pulmonary:     Effort: Pulmonary effort is normal.  Neurological:     Mental Status: He is alert.    Assessment/Plan: 1. Fever and chills - POC INFLUENZA TEST( negative)   2. Cough - OTC cough suppressants   3. Sore throat - Tylenol, rest, gargles   General Counseling: Kyri verbalizes understanding of the findings of todays visit and agrees with plan of treatment. I have discussed any further diagnostic evaluation that may be needed or ordered today. We also reviewed his medications today. he has been encouraged to call the office with any questions or concerns that should arise related to todays visit. work note is given for 2 days   Orders Placed This Encounter  Procedures  . POC INFLUENZA TEST    Meds ordered this encounter  Medications  . oseltamivir (TAMIFLU) 75 MG capsule    Sig: Take 1 capsule (75 mg total) by mouth 2 (two) times daily.    Dispense:  10 capsule    Refill:  0  . azithromycin (ZITHROMAX) 250 MG tablet    Sig: Use as directed    Dispense:  6 tablet    Refill:  0    Time spent:15 Minutes

## 2018-04-29 DIAGNOSIS — G4733 Obstructive sleep apnea (adult) (pediatric): Secondary | ICD-10-CM | POA: Diagnosis not present

## 2018-05-07 ENCOUNTER — Telehealth: Payer: Self-pay

## 2018-05-07 NOTE — Telephone Encounter (Signed)
Pt called stating he woke up today and had body aches/fatigue, fever of 100 degrees, and dry cough. Screened pt.  Spoke with Dr. Clayborn Bigness and she recommended he take otc tylenol to break the fever, take claritin for allergies, and mucinex for cough; drink plenty of water, get rest, and do not come into contact with others. She also said to monitor symptoms and if he ends up having a high grade fever, worsened cough, and sob then he should go to the ER.   Called pt back and notified him of the above. Advised him that if he had any questions to contact us.

## 2018-05-26 ENCOUNTER — Encounter: Payer: Self-pay | Admitting: Nurse Practitioner

## 2018-05-27 ENCOUNTER — Other Ambulatory Visit: Payer: Self-pay | Admitting: Adult Health

## 2018-05-27 DIAGNOSIS — E1159 Type 2 diabetes mellitus with other circulatory complications: Secondary | ICD-10-CM

## 2018-05-27 MED ORDER — DULAGLUTIDE 1.5 MG/0.5ML ~~LOC~~ SOAJ: 1.5000 mg | SUBCUTANEOUS | 3 refills | Status: DC

## 2018-05-29 DIAGNOSIS — G4733 Obstructive sleep apnea (adult) (pediatric): Secondary | ICD-10-CM | POA: Diagnosis not present

## 2018-06-29 ENCOUNTER — Other Ambulatory Visit: Payer: Self-pay

## 2018-06-29 ENCOUNTER — Ambulatory Visit: Payer: 59 | Admitting: Internal Medicine

## 2018-06-29 ENCOUNTER — Encounter: Payer: Self-pay | Admitting: Internal Medicine

## 2018-06-29 DIAGNOSIS — E1165 Type 2 diabetes mellitus with hyperglycemia: Secondary | ICD-10-CM

## 2018-06-29 DIAGNOSIS — I10 Essential (primary) hypertension: Secondary | ICD-10-CM

## 2018-06-29 DIAGNOSIS — G4733 Obstructive sleep apnea (adult) (pediatric): Secondary | ICD-10-CM | POA: Diagnosis not present

## 2018-06-29 MED ORDER — CARVEDILOL 3.125 MG PO TABS: ORAL_TABLET | ORAL | 3 refills | Status: DC

## 2018-06-29 MED ORDER — CARVEDILOL 3.125 MG PO TABS: 3.1250 mg | ORAL_TABLET | Freq: Two times a day (BID) | ORAL | 3 refills | Status: DC

## 2018-06-29 NOTE — Progress Notes (Signed)
[facility] [place], [place] [address]  Internal MEDICINE  Telephone Visit  Patient Name: Gerald [name]  [id number]  [id number]  Date of Service: 06/29/2018  I connected with the patient at [phone] telephone and verified the patients identity using two identifiers.   I discussed the limitations, risks, security and privacy concerns of performing an evaluation and management service by telephone and the availability of in person appointments. I also discussed with the patient that there may be a patient responsible charge related to the service.  The patient expressed understanding and agrees to proceed.    Chief Complaint  Patient presents with  . Telephone Screen  . Hypertension    BP 175/110 LAST NIGHT HAS BEEN AVERAGING THE SAME FOR 2 WEEKS NOW.   . Fatigue  . Excessive Sweating    WAKING UP IN MORNINGS SWEATING   . Telephone Assessment    HPI  Pt is c/o elevated bp. He is only on Lisinoril/hcts, coreg was prescribed during his visit to [facility], he never got it refilled. Dm numbers are improving, connected through webcam     Current Medication: Outpatient Encounter Medications as of 06/29/2018  Medication Sig  . acetaminophen (TYLENOL) 325 MG tablet Take 325-650 mg by mouth every 6 (six) hours as needed for mild pain or headache.  . albuterol (PROVENTIL HFA;VENTOLIN HFA) 108 (90 Base) MCG/ACT inhaler Inhale 2 puffs into the lungs every 6 (six) hours as needed for wheezing or shortness of breath.  Marland Kitchen aspirin 81 MG EC tablet Take 1 tablet (81 mg total) by mouth daily.  . carvedilol (COREG) 3.125 MG tablet Take one tab po bid for HTN  . citalopram (CELEXA) 40 MG tablet TAKE 1 TABLET BY MOUTH EVERY DAY FOR GAD  . Dulaglutide (TRULICITY) 1.5 SE/8.3TD SOPN Inject 1.5 mg into the skin every Wednesday.  Marland Kitchen guaiFENesin-codeine (ROBITUSSIN AC) 100-10 MG/5ML syrup Take 5 mLs by mouth 3 (three) times daily as needed for cough.  . Insulin Detemir (LEVEMIR FLEXTOUCH)  100 UNIT/ML Pen Inject 50units [place] daily. (Patient taking differently: Inject 50 Units into the skin every evening. Inject 50units [place] daily.)  . lisinopril-hydrochlorothiazide (PRINZIDE,ZESTORETIC) 20-25 MG tablet Take 1 tablet by mouth daily.  . nitroGLYCERIN (NITROSTAT) 0.4 MG SL tablet Place 1 tablet (0.4 mg total) under the tongue every 5 (five) minutes x 3 doses as needed for chest pain.  Marland Kitchen oseltamivir (TAMIFLU) 75 MG capsule Take 1 capsule (75 mg total) by mouth 2 (two) times daily.  . pantoprazole (PROTONIX) 40 MG tablet Take 1 tablet (40 mg total) by mouth 2 (two) times daily before a meal.  . Phenyleph-Chlorphen-Codeine 06-04-08 MG/5ML SYRP Take 5 mLs by mouth 3 (three) times daily.  . rosuvastatin (CRESTOR) 5 MG tablet Take 1 tablet (5 mg total) by mouth daily at 6 PM.  . [DISCONTINUED] carvedilol (COREG) 3.125 MG tablet Take 1 tablet (3.125 mg total) by mouth 2 (two) times daily with a meal.  . [DISCONTINUED] carvedilol (COREG) 3.125 MG tablet Take 1 tablet (3.125 mg total) by mouth 2 (two) times daily with a meal.  . [DISCONTINUED] carvedilol (COREG) 3.125 MG tablet Take one tab po bid for HTN  . [DISCONTINUED] amoxicillin-clavulanate (AUGMENTIN) 875-125 MG tablet Take 1 tablet by mouth 2 (two) times daily. (Patient not taking: Reported on 03/30/2018)  . [DISCONTINUED] azithromycin (ZITHROMAX) 250 MG tablet Use as directed (Patient not taking: Reported on 06/29/2018)   No facility-administered encounter medications on file as of 06/29/2018.  Surgical History: Past Surgical History:  Procedure Laterality Date  . CARDIAC CATHETERIZATION N/A 09/2011   [facility]; EF 50% with 30% mid LAD stenosis and no obstructive disease.  Marland Kitchen CARDIAC CATHETERIZATION  09/2010   [facility]; Mid LAD 40% stenosis; Mid Circumflex:Normal; Mid RCA; Normal  . CARDIAC CATHETERIZATION    . COLONOSCOPY    . ESOPHAGOGASTRODUODENOSCOPY (EGD) WITH PROPOFOL N/A 06/10/2017   Procedure: ESOPHAGOGASTRODUODENOSCOPY (EGD) WITH PROPOFOL;   Surgeon: Jonathon Bellows, MD;  Location: [facility] ENDOSCOPY;  Service: Gastroenterology;  Laterality: N/A;  . KNEE ARTHROSCOPY WITH MEDIAL MENISECTOMY Right 09/16/2012   Procedure: RIGHT KNEE ARTHROSCOPY WITH MEDIAL AND LATERAL MENISECTOMY, CHONDROPLASTY;  Surgeon: Ninetta Lights, MD;  Location: [facility];  Service: Orthopedics;  Laterality: Right;  RIGHT KNEE SCOPE MEDIAL MENISCECTOMY  . LEFT HEART CATH AND CORONARY ANGIOGRAPHY N/A 06/11/2016   Procedure: Left Heart Cath and Coronary Angiography;  Surgeon: Minna Merritts, MD;  Location: [place] CV LAB;  Service: Cardiovascular;  Laterality: N/A;    Medical History: Past Medical History:  Diagnosis Date  . Abnormal LFTs   . Diabetes mellitus without complication (HCC)    borderline  . Fundic gland polyps of stomach, benign   . Gastritis   . GERD (gastroesophageal reflux disease)   . Hepatic steatosis   . Hyperlipidemia   . Hypertension   . Nonischemic cardiomyopathy (HCC)    Previous ejection fraction of 35-40% on echo. 50% on cardiac catheterization in August of 2013  . Obesity   . Sleep apnea     Family History: Family History  Problem Relation Age of Onset  . Heart disease Father 24       MI  . Heart attack Father   . Stomach cancer Father   . Hypertension Mother   . Breast cancer Mother     Social History   Socioeconomic History  . Marital status: Married    Spouse name: Not on file  . Number of children: 3  . Years of education: Not on file  . Highest education level: Not on file  Occupational History  . Occupation: Librarian, academic  Social Needs  . Financial resource strain: Not on file  . Food insecurity:    Worry: Not on file    Inability: Not on file  . Transportation needs:    Medical: Not on file    Non-medical: Not on file  Tobacco Use  . Smoking status: Never Smoker  . Smokeless tobacco: Never Used  Substance and Sexual Activity  . Alcohol use: Not Currently    Alcohol/week: 0.0  standard drinks    Comment: rare  . Drug use: No  . Sexual activity: Not on file  Lifestyle  . Physical activity:    Days per week: Not on file    Minutes per session: Not on file  . Stress: Not on file  Relationships  . Social connections:    Talks on phone: Not on file    Gets together: Not on file    Attends religious service: Not on file    Active member of club or organization: Not on file    Attends meetings of clubs or organizations: Not on file    Relationship status: Not on file  . Intimate partner violence:    Fear of current or ex partner: Not on file    Emotionally abused: Not on file    Physically abused: Not on file    Forced sexual activity: Not on file  Other  Topics Concern  . Not on file  Social History Narrative   Married.  21 yo son.   Wife on disability for bipolar disorder.      Review of Systems  Constitutional: Negative.   HENT: Negative.   Respiratory: Positive for chest tightness and shortness of breath.   Cardiovascular: Positive for palpitations.  Gastrointestinal: Negative.   Endocrine: Positive for heat intolerance.  Neurological: Negative.    Vital Signs: Resp 16   Ht 6\' 2"  (1.88 m)   Wt (!) 329 lb (149.2 kg)   BMI 42.24 kg/m   Observation/Objective: Pt is sitting in his vehicle when connected through webcam , seems to be in NAD  Assessment/Plan: 1. Essential hypertension Continue Lisinopril hctz, add coreg 3.125 mg po bid, will increase prn   2. Uncontrolled type 2 diabetes mellitus with hyperglycemia ([place]) Continue meds as before, will need hg aic   General Counseling: vence lalor understanding of the findings of today's phone visit and agrees with plan of treatment. I have discussed any further diagnostic evaluation that may be needed or ordered today. We also reviewed his medications today. he has been encouraged to call the office with any questions or concerns that should arise related to todays visit.  Meds ordered  this encounter  Medications  . DISCONTD: carvedilol (COREG) 3.125 MG tablet    Sig: Take 1 tablet (3.125 mg total) by mouth 2 (two) times daily with a meal.    Dispense:  60 tablet    Refill:  3  . DISCONTD: carvedilol (COREG) 3.125 MG tablet    Sig: Take one tab po bid for HTN    Dispense:  60 tablet    Refill:  3  . carvedilol (COREG) 3.125 MG tablet    Sig: Take one tab po bid for HTN    Dispense:  60 tablet    Refill:  3      Time spent:15 Minutes    Dr Lavera Guise Internal medicine

## 2018-06-30 ENCOUNTER — Telehealth: Payer: Self-pay | Admitting: Nurse Practitioner

## 2018-06-30 NOTE — Telephone Encounter (Signed)
-----   Message from Lavera Guise, MD sent at 06/29/2018 10:07 AM EDT ----- CAN U PLEASE CHECK ON HIM TO SEE IF HE IS USING HIS CPAP

## 2018-06-30 NOTE — Telephone Encounter (Signed)
Called and left message for pt , call us back , would like to know if patient is currently using cpap?

## 2018-07-01 DIAGNOSIS — D485 Neoplasm of uncertain behavior of skin: Secondary | ICD-10-CM | POA: Diagnosis not present

## 2018-07-01 DIAGNOSIS — D1800 Hemangioma unspecified site: Secondary | ICD-10-CM | POA: Diagnosis not present

## 2018-07-01 DIAGNOSIS — D225 Melanocytic nevi of trunk: Secondary | ICD-10-CM | POA: Diagnosis not present

## 2018-07-01 DIAGNOSIS — L821 Other seborrheic keratosis: Secondary | ICD-10-CM | POA: Diagnosis not present

## 2018-07-03 ENCOUNTER — Other Ambulatory Visit: Payer: Self-pay

## 2018-07-03 ENCOUNTER — Emergency Department: Payer: 59

## 2018-07-03 DIAGNOSIS — I10 Essential (primary) hypertension: Secondary | ICD-10-CM | POA: Insufficient documentation

## 2018-07-03 DIAGNOSIS — Z79899 Other long term (current) drug therapy: Secondary | ICD-10-CM | POA: Diagnosis not present

## 2018-07-03 DIAGNOSIS — R5383 Other fatigue: Secondary | ICD-10-CM | POA: Insufficient documentation

## 2018-07-03 DIAGNOSIS — E119 Type 2 diabetes mellitus without complications: Secondary | ICD-10-CM | POA: Insufficient documentation

## 2018-07-03 DIAGNOSIS — R0789 Other chest pain: Secondary | ICD-10-CM | POA: Insufficient documentation

## 2018-07-03 DIAGNOSIS — Z85828 Personal history of other malignant neoplasm of skin: Secondary | ICD-10-CM | POA: Diagnosis not present

## 2018-07-03 LAB — BASIC METABOLIC PANEL
Anion gap: 8 (ref 5–15)
BUN: 16 mg/dL (ref 6–20)
CO2: 26 mmol/L (ref 22–32)
Calcium: 9.1 mg/dL (ref 8.9–10.3)
Chloride: 99 mmol/L (ref 98–111)
Creatinine, Ser: 0.84 mg/dL (ref 0.61–1.24)
GFR calc Af Amer: >60 mL/min (ref >60)
GFR calc non Af Amer: >60 mL/min (ref >60)
Glucose, Bld: 297 mg/dL — ABNORMAL HIGH (ref 70–99)
Potassium: 3.8 mmol/L (ref 3.5–5.1)
Sodium: 133 mmol/L — ABNORMAL LOW (ref 135–145)

## 2018-07-03 LAB — CBC
HCT: 40.7 % (ref 39.0–52.0)
Hemoglobin: 13.9 g/dL (ref 13.0–17.0)
MCH: 28.9 pg (ref 26.0–34.0)
MCHC: 34.2 g/dL (ref 30.0–36.0)
MCV: 84.6 fL (ref 80.0–100.0)
Platelets: 253 10*3/uL (ref 150–400)
RBC: 4.81 MIL/uL (ref 4.22–5.81)
RDW: 13.4 % (ref 11.5–15.5)
WBC: 8.6 10*3/uL (ref 4.0–10.5)
nRBC: 0 % (ref 0.0–0.2)

## 2018-07-03 LAB — TROPONIN I: Troponin I: <0.03 ng/mL (ref <0.03)

## 2018-07-03 NOTE — ED Triage Notes (Signed)
Patient reports chest pain that comes and goes since 5 am.  Reports having issues with his blood pressure "for awhile" states at home was 160/130.  Patient also reports feeling very weak.

## 2018-07-04 ENCOUNTER — Emergency Department
Admission: EM | Admit: 2018-07-04 | Discharge: 2018-07-04 | Disposition: A | Discharge status: Home / Self Care | Payer: 59 | Attending: Student in an Organized Health Care Education/Training Program | Admitting: Student in an Organized Health Care Education/Training Program

## 2018-07-04 ENCOUNTER — Encounter: Payer: Self-pay | Admitting: Radiology

## 2018-07-04 ENCOUNTER — Emergency Department: Payer: 59

## 2018-07-04 DIAGNOSIS — R0789 Other chest pain: Secondary | ICD-10-CM

## 2018-07-04 LAB — TROPONIN I: Troponin I: <0.03 ng/mL (ref <0.03)

## 2018-07-04 MED ORDER — IOHEXOL 350 MG/ML SOLN
75.0000 mL | Freq: Once | INTRAVENOUS | Status: AC | PRN
  Administered 2018-07-04: 75 mL via INTRAVENOUS
  Filled 2018-07-04: qty 75

## 2018-07-04 MED ORDER — LORAZEPAM 0.5 MG PO TABS
0.5000 mg | ORAL_TABLET | Freq: Once | ORAL | Status: AC
  Administered 2018-07-04: 0.5 mg via ORAL
  Filled 2018-07-04: qty 1

## 2018-07-04 NOTE — ED Notes (Signed)
Pt reports hypertension at home and say he's being following by his MD; blood pressure has been in "triple digits" on the top and bottom for about 2 weeks and he can't get it to come home; reports headaches daily; yesterday early morning he began having heaviness/pain to his left chest that radiates through to his back; also reports some numbness to his left outer thigh; pt awake and alert; talking in complete coherent sentences;

## 2018-07-04 NOTE — ED Notes (Signed)
To CT via stretcher with Sudie Bailey from radiology

## 2018-07-04 NOTE — ED Notes (Signed)
Dr Robinson at bedside 

## 2018-07-04 NOTE — ED Notes (Signed)
Pt has just returned from CT.

## 2018-07-04 NOTE — ED Notes (Signed)
Pt waiting in lobby. Vitals signs taken. Pt complains of chest pain. Pt stated "chest pain was dull now sharp". RN notified

## 2018-07-04 NOTE — ED Provider Notes (Signed)
[facility] Emergency Department Provider Note    First MD Initiated Contact with Patient 07/04/18 0410     (approximate)  I have reviewed the triage vital signs and the nursing notes.   HISTORY  Chief Complaint Chest Pain and Hypertension    HPI Gerald [name] is a 53 y.o. male below listed past medical history presents the ER for evaluation of generalized fatigue as well as chest discomfort for the past 24 hours that radiates through to his back.  He is also concerned his blood pressures been elevated.  States that he will feel that intermittently he will have numbness and tingling going down into his leg.  Denies any nausea or vomiting.  No fevers.   Echo 12/19: Study Conclusions  - Procedure narrative: Transthoracic echocardiography for left   ventricular function evaluation, for right ventricular function   evaluation, and for assessment of valvular function. The study   was technically difficult. - Left ventricle: The cavity size was normal. Systolic function was   normal. The estimated ejection fraction was in the range of 60%   to 65%. Wall motion was normal; there were no regional wall   motion abnormalities. The study is not technically sufficient to   allow evaluation of LV diastolic function. - Left atrium: The atrium was normal in size. - Right ventricle: Systolic function was normal. - Pulmonary arteries: Systolic pressure could not be accurately   estimated.  Impressions:  - Normal study.    Past Medical History:  Diagnosis Date  . Abnormal LFTs   . Diabetes mellitus without complication (HCC)    borderline  . Fundic gland polyps of stomach, benign   . Gastritis   . GERD (gastroesophageal reflux disease)   . Hepatic steatosis   . Hyperlipidemia   . Hypertension   . Nonischemic cardiomyopathy (HCC)    Previous ejection fraction of 35-40% on echo. 50% on cardiac catheterization in August of 2013  . Obesity   . Sleep  apnea    Family History  Problem Relation Age of Onset  . Heart disease Father 76       MI  . Heart attack Father   . Stomach cancer Father   . Hypertension Mother   . Breast cancer Mother    Past Surgical History:  Procedure Laterality Date  . CARDIAC CATHETERIZATION N/A 09/2011   [facility]; EF 50% with 30% mid LAD stenosis and no obstructive disease.  Marland Kitchen CARDIAC CATHETERIZATION  09/2010   [facility]; Mid LAD 40% stenosis; Mid Circumflex:Normal; Mid RCA; Normal  . CARDIAC CATHETERIZATION    . COLONOSCOPY    . ESOPHAGOGASTRODUODENOSCOPY (EGD) WITH PROPOFOL N/A 06/10/2017   Procedure: ESOPHAGOGASTRODUODENOSCOPY (EGD) WITH PROPOFOL;  Surgeon: Jonathon Bellows, MD;  Location: [facility] ENDOSCOPY;  Service: Gastroenterology;  Laterality: N/A;  . KNEE ARTHROSCOPY WITH MEDIAL MENISECTOMY Right 09/16/2012   Procedure: RIGHT KNEE ARTHROSCOPY WITH MEDIAL AND LATERAL MENISECTOMY, CHONDROPLASTY;  Surgeon: Ninetta Lights, MD;  Location: [place];  Service: Orthopedics;  Laterality: Right;  RIGHT KNEE SCOPE MEDIAL MENISCECTOMY  . LEFT HEART CATH AND CORONARY ANGIOGRAPHY N/A 06/11/2016   Procedure: Left Heart Cath and Coronary Angiography;  Surgeon: Minna Merritts, MD;  Location: [place] CV LAB;  Service: Cardiovascular;  Laterality: N/A;   Patient Active Problem List   Diagnosis Date Noted  . Chest pain 01/08/2018  . Gastroesophageal reflux disease without esophagitis 08/23/2017  . Urinary tract infection without hematuria 06/14/2017  . Acute non-recurrent sinusitis 06/14/2017  .  Generalized anxiety disorder 02/02/2017  . Other fatigue 02/02/2017  . Abnormal results of liver function studies 02/02/2017  . Myalgia 02/02/2017  . Edema, unspecified 02/02/2017  . Circadian rhythm sleep disorder, shift work type 02/02/2017  . Acne vulgaris 02/02/2017  . Occlusion and stenosis of bilateral carotid arteries 02/02/2017  . Abnormal weight gain 02/02/2017  . Pain in unspecified knee 02/02/2017  .  Neoplasm of uncertain behavior of skin 02/02/2017  . Hypersomnia, unspecified 02/02/2017  . Shortness of breath 02/02/2017  . Morbid obesity ([place]) 07/07/2016  . Atypical chest pain 06/08/2016  . Other chest pain 05/22/2014  . Mass of jaw 05/22/2014  . Right flank pain 11/23/2013  . Dysuria 11/23/2013  . Encounter for wellness examination 11/08/2013  . Retaining fluid 11/08/2013  . Urinary incontinence 03/14/2013  . Depression 01/13/2013  . Type 2 diabetes mellitus ([place]) 09/14/2012  . Nonischemic cardiomyopathy ([place])   . Mixed hyperlipidemia 05/28/2012  . Chronic pain syndrome 03/30/2012  . HTN (hypertension) 03/30/2012      Prior to Admission medications   Medication Sig Start Date End Date Taking? Authorizing Provider  acetaminophen (TYLENOL) 325 MG tablet Take 325-650 mg by mouth every 6 (six) hours as needed for mild pain or headache.    [provider]  albuterol (PROVENTIL HFA;VENTOLIN HFA) 108 (90 Base) MCG/ACT inhaler Inhale 2 puffs into the lungs every 6 (six) hours as needed for wheezing or shortness of breath. 01/12/18   Kendell Bane, NP  aspirin 81 MG EC tablet Take 1 tablet (81 mg total) by mouth daily. 01/10/18   Fritzi Mandes, MD  carvedilol (COREG) 3.125 MG tablet Take one tab po bid for HTN 06/29/18   Lavera Guise, MD  citalopram (CELEXA) 40 MG tablet TAKE 1 TABLET BY MOUTH EVERY DAY FOR GAD 07/30/17   Ronnell Freshwater, NP  Dulaglutide (TRULICITY) 1.5 MB/5.5HR SOPN Inject 1.5 mg into the skin every Wednesday. 06/02/18   Ronnell Freshwater, NP  guaiFENesin-codeine (ROBITUSSIN AC) 100-10 MG/5ML syrup Take 5 mLs by mouth 3 (three) times daily as needed for cough. 01/13/18   Kendell Bane, NP  Insulin Detemir (LEVEMIR FLEXTOUCH) 100 UNIT/ML Pen Inject 50units [place] daily. Patient taking differently: Inject 50 Units into the skin every evening. Inject 50units [place] daily. 11/05/17   Ronnell Freshwater, NP  lisinopril-hydrochlorothiazide (PRINZIDE,ZESTORETIC) 20-25 MG  tablet Take 1 tablet by mouth daily. 03/02/18   Ronnell Freshwater, NP  nitroGLYCERIN (NITROSTAT) 0.4 MG SL tablet Place 1 tablet (0.4 mg total) under the tongue every 5 (five) minutes x 3 doses as needed for chest pain. 01/09/18   Fritzi Mandes, MD  oseltamivir (TAMIFLU) 75 MG capsule Take 1 capsule (75 mg total) by mouth 2 (two) times daily. 03/30/18   Lavera Guise, MD  pantoprazole (PROTONIX) 40 MG tablet Take 1 tablet (40 mg total) by mouth 2 (two) times daily before a meal. 01/09/18   Fritzi Mandes, MD  Phenyleph-Chlorphen-Codeine 06-04-08 MG/5ML SYRP Take 5 mLs by mouth 3 (three) times daily. 01/12/18   Kendell Bane, NP  rosuvastatin (CRESTOR) 5 MG tablet Take 1 tablet (5 mg total) by mouth daily at 6 PM. 01/09/18   Fritzi Mandes, MD    Allergies Statins    Social History Social History   Tobacco Use  . Smoking status: Never Smoker  . Smokeless tobacco: Never Used  Substance Use Topics  . Alcohol use: Not Currently    Alcohol/week: 0.0 standard drinks    Comment: rare  .  Drug use: No    Review of Systems Patient denies headaches, rhinorrhea, blurry vision, numbness, shortness of breath, chest pain, edema, cough, abdominal pain, nausea, vomiting, diarrhea, dysuria, fevers, rashes or hallucinations unless otherwise stated above in HPI. ____________________________________________   PHYSICAL EXAM:  VITAL SIGNS: Vitals:   07/04/18 0013 07/04/18 0431  BP: (!) 149/104 (!) 135/98  Pulse: 72 73  Resp: 18 18  Temp: 98.3 F (36.8 C)   SpO2: 96% 99%    Constitutional: Alert and oriented.  Eyes: Conjunctivae are normal.  Head: Atraumatic. Nose: No congestion/rhinnorhea. Mouth/Throat: Mucous membranes are moist.   Neck: No stridor. Painless ROM.  Cardiovascular: Normal rate, regular rhythm. Grossly normal heart sounds.  Good peripheral circulation. Respiratory: Normal respiratory effort.  No retractions. Lungs CTAB. Gastrointestinal: Soft and nontender. No distention. No abdominal  bruits. No CVA tenderness. Genitourinary:  Musculoskeletal: No lower extremity tenderness nor edema.  No joint effusions. Neurologic:  Normal speech and language. No gross focal neurologic deficits are appreciated. No facial droop Skin:  Skin is warm, dry and intact. No rash noted. Psychiatric: Mood and affect are anxious. Speech and behavior are normal.  ____________________________________________   LABS (all labs ordered are listed, but only abnormal results are displayed)  Results for orders placed or performed during the hospital encounter of 07/04/18 (from the past 24 hour(s))  Basic metabolic panel     Status: Abnormal   Collection Time: 07/03/18  9:42 PM  Result Value Ref Range   Sodium 133 (L) 135 - 145 mmol/L   Potassium 3.8 3.5 - 5.1 mmol/L   Chloride 99 98 - 111 mmol/L   CO2 26 22 - 32 mmol/L   Glucose, Bld 297 (H) 70 - 99 mg/dL   BUN 16 6 - 20 mg/dL   Creatinine, Ser 0.84 0.61 - 1.24 mg/dL   Calcium 9.1 8.9 - 10.3 mg/dL   GFR calc non Af Amer >60 >60 mL/min   GFR calc Af Amer >60 >60 mL/min   Anion gap 8 5 - 15  CBC     Status: None   Collection Time: 07/03/18  9:42 PM  Result Value Ref Range   WBC 8.6 4.0 - 10.5 K/uL   RBC 4.81 4.22 - 5.81 MIL/uL   Hemoglobin 13.9 13.0 - 17.0 g/dL   HCT 40.7 39.0 - 52.0 %   MCV 84.6 80.0 - 100.0 fL   MCH 28.9 26.0 - 34.0 pg   MCHC 34.2 30.0 - 36.0 g/dL   RDW 13.4 11.5 - 15.5 %   Platelets 253 150 - 400 K/uL   nRBC 0.0 0.0 - 0.2 %  Troponin I - ONCE - STAT     Status: None   Collection Time: 07/03/18  9:42 PM  Result Value Ref Range   Troponin I <0.03 <0.03 ng/mL  Troponin I - Once-Timed     Status: None   Collection Time: 07/04/18  2:21 AM  Result Value Ref Range   Troponin I <0.03 <0.03 ng/mL   ____________________________________________  EKG My review and personal interpretation at Time: 21:38   Indication: chest pain  Rate: 80  Rhythm: sinus Axis: normal Other: normal intervals, no stemi  ____________________________________________  RADIOLOGY  I personally reviewed all radiographic images ordered to evaluate for the above acute complaints and reviewed radiology reports and findings.  These findings were personally discussed with the patient.  Please see medical record for radiology report.  ____________________________________________   PROCEDURES  Procedure(s) performed:  Procedures    Critical  Care performed: no ____________________________________________   INITIAL IMPRESSION / ASSESSMENT AND PLAN / ED COURSE  Pertinent labs & imaging results that were available during my care of the patient were reviewed by me and considered in my medical decision making (see chart for details).   DDX: ACS, pericarditis, esophagitis, boerhaaves, pe, dissection, pna, bronchitis, costochondritis   NORWIN ALEMAN is a 53 y.o. who presents to the ED with symptoms as described above.  Patient nontoxic-appearing.  EKG is unchanged from previous.  Serial enzymes are negative.  Discussed as he is describing pain radiating through to his back with hypertension will order CT angiogram to exclude dissection or aneurysm.  Patient somewhat anxious appearing will give trial of Ativan for symptomatic relief.  Have low suspicion for ACS, pneumonia, pneumothorax, congestive heart failure.  His abdominal exam is soft and benign.    ----------------------------------------- 5:35 AM on 07/04/2018 -----------------------------------------  CT angiogram negative.  Given negative enzymes no EKG changes with recent echo and cardiology follow-up I do believe patient stable and appropriate for outpatient follow-up.  The patient was evaluated in Emergency Department today for the symptoms described in the history of present illness. He/she was evaluated in the context of the global COVID-19 pandemic, which necessitated consideration that the patient might be at risk for infection with the SARS-CoV-2  virus that causes COVID-19. Institutional protocols and algorithms that pertain to the evaluation of patients at risk for COVID-19 are in a state of rapid change based on information released by regulatory bodies including the CDC and federal and state organizations. These policies and algorithms were followed during the patient's care in the ED.  As part of my medical decision making, I reviewed the following data within the [place] notes reviewed and incorporated, Labs reviewed, notes from prior ED visits and [place] Controlled Substance Database   ____________________________________________   FINAL CLINICAL IMPRESSION(S) / ED DIAGNOSES  Final diagnoses:  Atypical chest pain      NEW MEDICATIONS STARTED DURING THIS VISIT:  New Prescriptions   No medications on file     Note:  This document was prepared using Dragon voice recognition software and may include unintentional dictation errors.    Merlyn Lot, MD 07/04/18 [phone]

## 2018-07-04 NOTE — ED Notes (Signed)
Pt stopped this RN in the waiting room as I was returned in to DPOD; asks about the time and says his left arm is feeling numb; reported same to first nurse, Lelon Frohlich who said she would speak with pt

## 2018-07-26 ENCOUNTER — Encounter: Payer: Self-pay | Admitting: Adult Health

## 2018-07-26 ENCOUNTER — Other Ambulatory Visit: Payer: Self-pay

## 2018-07-26 ENCOUNTER — Ambulatory Visit (INDEPENDENT_AMBULATORY_CARE_PROVIDER_SITE_OTHER): Payer: 59 | Admitting: Adult Health

## 2018-07-26 VITALS — BP 150/92 | HR 73 | Resp 16 | Ht 75.0 in | Wt 338.0 lb

## 2018-07-26 DIAGNOSIS — E785 Hyperlipidemia, unspecified: Secondary | ICD-10-CM

## 2018-07-26 DIAGNOSIS — E1165 Type 2 diabetes mellitus with hyperglycemia: Secondary | ICD-10-CM | POA: Diagnosis not present

## 2018-07-26 DIAGNOSIS — R300 Dysuria: Secondary | ICD-10-CM

## 2018-07-26 DIAGNOSIS — I10 Essential (primary) hypertension: Secondary | ICD-10-CM

## 2018-07-26 DIAGNOSIS — K219 Gastro-esophageal reflux disease without esophagitis: Secondary | ICD-10-CM

## 2018-07-26 DIAGNOSIS — Z0001 Encounter for general adult medical examination with abnormal findings: Secondary | ICD-10-CM | POA: Diagnosis not present

## 2018-07-26 LAB — POCT GLYCOSYLATED HEMOGLOBIN (HGB A1C): Hemoglobin A1C: 9.4 % — AB (ref 4.0–5.6)

## 2018-07-26 MED ORDER — LISINOPRIL-HYDROCHLOROTHIAZIDE 20-25 MG PO TABS: 1.0000 | ORAL_TABLET | Freq: Two times a day (BID) | ORAL | 3 refills | Status: DC

## 2018-07-26 NOTE — Progress Notes (Signed)
[facility] [place], [place] [address]  Internal MEDICINE  Office Visit Note  Patient Name: Gerald [name]  [id number]  [id number]  Date of Service: 07/26/2018  Chief Complaint  Patient presents with  . Medical Management of Chronic Issues  . Hypertension    4 week follow up added coreg, pt is unable to take keeps him extremely tired and drugged up  . Annual Exam  . Diabetes  . Hyperlipidemia  . Gastroesophageal Reflux  . Quality Metric Gaps    foot exam     HPI Pt is here for routine health maintenance examination.  He is a 53 yo obese male.  He has a history of DM, HLD, GERD and HTN. He denies any overt complaints at this time.  His a1c is elevated today 9.4. IT was 8.5 in December.  He reports his diet has been out of control due to covid. His bp is also elevated today, 150/92, he states he was started on coreg 4 weeks ago but has been unable to take it due to it making him tired, and feeling "drugged up". He is also in need of a Diabetic foot exam to close his quality metric gaps.    Current Medication: Outpatient Encounter Medications as of 07/26/2018  Medication Sig  . acetaminophen (TYLENOL) 325 MG tablet Take 325-650 mg by mouth every 6 (six) hours as needed for mild pain or headache.  . albuterol (PROVENTIL HFA;VENTOLIN HFA) 108 (90 Base) MCG/ACT inhaler Inhale 2 puffs into the lungs every 6 (six) hours as needed for wheezing or shortness of breath.  Marland Kitchen aspirin 81 MG EC tablet Take 1 tablet (81 mg total) by mouth daily.  . citalopram (CELEXA) 40 MG tablet TAKE 1 TABLET BY MOUTH EVERY DAY FOR GAD  . Dulaglutide (TRULICITY) 1.5 MW/4.1LK SOPN Inject 1.5 mg into the skin every Wednesday.  . Insulin Detemir (LEVEMIR FLEXTOUCH) 100 UNIT/ML Pen Inject 50units [place] daily. (Patient taking differently: Inject 50 Units into the skin every evening. Inject 50units [place] daily.)  . lisinopril-hydrochlorothiazide (PRINZIDE,ZESTORETIC) 20-25 MG tablet Take 1 tablet  by mouth daily.  . nitroGLYCERIN (NITROSTAT) 0.4 MG SL tablet Place 1 tablet (0.4 mg total) under the tongue every 5 (five) minutes x 3 doses as needed for chest pain.  . pantoprazole (PROTONIX) 40 MG tablet Take 1 tablet (40 mg total) by mouth 2 (two) times daily before a meal.  . carvedilol (COREG) 3.125 MG tablet Take one tab po bid for HTN (Patient not taking: Reported on 07/26/2018)  . [DISCONTINUED] guaiFENesin-codeine (ROBITUSSIN AC) 100-10 MG/5ML syrup Take 5 mLs by mouth 3 (three) times daily as needed for cough. (Patient not taking: Reported on 07/26/2018)  . [DISCONTINUED] oseltamivir (TAMIFLU) 75 MG capsule Take 1 capsule (75 mg total) by mouth 2 (two) times daily. (Patient not taking: Reported on 07/26/2018)  . [DISCONTINUED] Phenyleph-Chlorphen-Codeine 06-04-08 MG/5ML SYRP Take 5 mLs by mouth 3 (three) times daily. (Patient not taking: Reported on 07/26/2018)  . [DISCONTINUED] rosuvastatin (CRESTOR) 5 MG tablet Take 1 tablet (5 mg total) by mouth daily at 6 PM. (Patient not taking: Reported on 07/26/2018)   No facility-administered encounter medications on file as of 07/26/2018.     Surgical History: Past Surgical History:  Procedure Laterality Date  . CARDIAC CATHETERIZATION N/A 09/2011   [facility]; EF 50% with 30% mid LAD stenosis and no obstructive disease.  Marland Kitchen CARDIAC CATHETERIZATION  09/2010   [facility]; Mid LAD 40% stenosis; Mid Circumflex:Normal; Mid RCA; Normal  .  CARDIAC CATHETERIZATION    . COLONOSCOPY    . ESOPHAGOGASTRODUODENOSCOPY (EGD) WITH PROPOFOL N/A 06/10/2017   Procedure: ESOPHAGOGASTRODUODENOSCOPY (EGD) WITH PROPOFOL;  Surgeon: Jonathon Bellows, MD;  Location: [facility] ENDOSCOPY;  Service: Gastroenterology;  Laterality: N/A;  . KNEE ARTHROSCOPY WITH MEDIAL MENISECTOMY Right 09/16/2012   Procedure: RIGHT KNEE ARTHROSCOPY WITH MEDIAL AND LATERAL MENISECTOMY, CHONDROPLASTY;  Surgeon: Ninetta Lights, MD;  Location: [place];  Service: Orthopedics;  Laterality: Right;  RIGHT  KNEE SCOPE MEDIAL MENISCECTOMY  . LEFT HEART CATH AND CORONARY ANGIOGRAPHY N/A 06/11/2016   Procedure: Left Heart Cath and Coronary Angiography;  Surgeon: Minna Merritts, MD;  Location: [place] CV LAB;  Service: Cardiovascular;  Laterality: N/A;    Medical History: Past Medical History:  Diagnosis Date  . Abnormal LFTs   . Carcinoma ([place])   . Diabetes mellitus without complication (HCC)    borderline  . Fundic gland polyps of stomach, benign   . Gastritis   . GERD (gastroesophageal reflux disease)   . Hepatic steatosis   . Hyperlipidemia   . Hypertension   . Nonischemic cardiomyopathy (HCC)    Previous ejection fraction of 35-40% on echo. 50% on cardiac catheterization in August of 2013  . Obesity   . Sleep apnea     Family History: Family History  Problem Relation Age of Onset  . Heart disease Father 93       MI  . Heart attack Father   . Stomach cancer Father   . Hypertension Mother   . Breast cancer Mother       Review of Systems   Vital Signs: BP (!) 150/92 (BP Location: Left Arm, Patient Position: Sitting, Cuff Size: Large)   Pulse 73   Resp 16   Ht 6\' 3"  (1.905 m)   Wt (!) 338 lb (153.3 kg)   SpO2 96%   BMI 42.25 kg/m    Physical Exam   LABS: Recent Results (from the past 2160 hour(s))  Basic metabolic panel     Status: Abnormal   Collection Time: 07/03/18  9:42 PM  Result Value Ref Range   Sodium 133 (L) 135 - 145 mmol/L   Potassium 3.8 3.5 - 5.1 mmol/L   Chloride 99 98 - 111 mmol/L   CO2 26 22 - 32 mmol/L   Glucose, Bld 297 (H) 70 - 99 mg/dL   BUN 16 6 - 20 mg/dL   Creatinine, Ser 0.84 0.61 - 1.24 mg/dL   Calcium 9.1 8.9 - 10.3 mg/dL   GFR calc non Af Amer >60 >60 mL/min   GFR calc Af Amer >60 >60 mL/min   Anion gap 8 5 - 15    Comment: Performed at [facility], [address] [place], [place] [address]  CBC     Status: None   Collection Time: 07/03/18  9:42 PM  Result Value Ref Range   WBC 8.6 4.0 - 10.5 K/uL   RBC  4.81 4.22 - 5.81 MIL/uL   Hemoglobin 13.9 13.0 - 17.0 g/dL   HCT 40.7 39.0 - 52.0 %   MCV 84.6 80.0 - 100.0 fL   MCH 28.9 26.0 - 34.0 pg   MCHC 34.2 30.0 - 36.0 g/dL   RDW 13.4 11.5 - 15.5 %   Platelets 253 150 - 400 K/uL   nRBC 0.0 0.0 - 0.2 %    Comment: Performed at [facility], [address], [place], [place] [address]  Troponin I - ONCE - STAT  Status: None   Collection Time: 07/03/18  9:42 PM  Result Value Ref Range   Troponin I <0.03 <0.03 ng/mL    Comment: Performed at [facility], [address] [place], [place] [address]  Troponin I - Once-Timed     Status: None   Collection Time: 07/04/18  2:21 AM  Result Value Ref Range   Troponin I <0.03 <0.03 ng/mL    Comment: Performed at [facility], [place], [place] [address]  POCT HgB A1C     Status: Abnormal   Collection Time: 07/26/18 10:04 AM  Result Value Ref Range   Hemoglobin A1C 9.4 (A) 4.0 - 5.6 %   HbA1c POC (<> result, manual entry)     HbA1c, POC (prediabetic range)     HbA1c, POC (controlled diabetic range)      Assessment/Plan: 1. Encounter for general adult medical examination with abnormal findings Foot exam performed and documented.  Now up to date on PHM.  Labs ordered and will be reviewed as results are available.  - CBC with Differential/Platelet - Lipid Panel With LDL/HDL Ratio - TSH - T4, free - Comprehensive metabolic panel - PSA  2. Uncontrolled type 2 diabetes mellitus with hyperglycemia (HCC) A1C 9.4 today.  Pt declined to alter his medications.  HE would like to attempt to get control of his diet again.  Will follow up in 3 months.  Discussed dangers of DKA, and signs and symptoms to watch for.  PT verbalized understanding.  - POCT HgB A1C  3. Essential hypertension Elevated today, discontinue coreg due to patient intolerance.  Increase lisinopril to bid. And follow up in 4 weeks.  Sooner, if bp remains elevated.   -  lisinopril-hydrochlorothiazide (ZESTORETIC) 20-25 MG tablet; Take 1 tablet by mouth 2 (two) times a day.  Dispense: 60 tablet; Refill: 3  4. Morbid obesity (HCC) Obesity Counseling: Risk Assessment: An assessment of behavioral risk factors was made today and includes lack of exercise sedentary lifestyle, lack of portion control and poor dietary habits.  Risk Modification Advice: She was counseled on portion control guidelines. Restricting daily caloric intake to. . The detrimental long term effects of obesity on her health and ongoing poor compliance was also discussed with the patient.  5. Gastroesophageal reflux disease without esophagitis Stable, continue current medication regimen.  6. Hyperlipidemia, unspecified hyperlipidemia type Will evaluate lipid panel with labs.   7. Dysuria - Urinalysis, Routine w reflex microscopic  General Counseling: Gerald [name] verbalizes understanding of the findings of todays visit and agrees with plan of treatment. I have discussed any further diagnostic evaluation that may be needed or ordered today. We also reviewed his medications today. he has been encouraged to call the office with any questions or concerns that should arise related to todays visit.   Orders Placed This Encounter  Procedures  . Urinalysis, Routine w reflex microscopic  . CBC with Differential/Platelet  . Lipid Panel With LDL/HDL Ratio  . TSH  . T4, free  . Comprehensive metabolic panel  . PSA  . POCT HgB A1C    No orders of the defined types were placed in this encounter.   Time spent: 25 Minutes   This patient was seen by Orson Gear AGNP-C in Collaboration with Dr Lavera Guise as a part of collaborative care agreement    Gerald [name] AGNP-C Internal Medicine

## 2018-07-27 LAB — URINALYSIS, ROUTINE W REFLEX MICROSCOPIC
Bilirubin, UA: NEGATIVE
Ketones, UA: NEGATIVE
Leukocytes,UA: NEGATIVE
Nitrite, UA: NEGATIVE
Protein,UA: NEGATIVE
RBC, UA: NEGATIVE
Specific Gravity, UA: 1.023 (ref 1.005–1.030)
Urobilinogen, Ur: 0.2 mg/dL (ref 0.2–1.0)
pH, UA: 5 (ref 5.0–7.5)

## 2018-08-23 ENCOUNTER — Ambulatory Visit: Payer: Self-pay | Admitting: Adult Health

## 2018-08-30 ENCOUNTER — Emergency Department: Payer: 59

## 2018-08-30 ENCOUNTER — Encounter: Payer: Self-pay | Admitting: Adult Health

## 2018-08-30 ENCOUNTER — Emergency Department
Admission: EM | Admit: 2018-08-30 | Discharge: 2018-08-30 | Disposition: A | Discharge status: Home / Self Care | Payer: 59 | Attending: Emergency Medicine | Admitting: Emergency Medicine

## 2018-08-30 ENCOUNTER — Ambulatory Visit: Payer: 59 | Admitting: Adult Health

## 2018-08-30 ENCOUNTER — Other Ambulatory Visit: Payer: Self-pay

## 2018-08-30 ENCOUNTER — Encounter: Payer: Self-pay | Admitting: *Deleted

## 2018-08-30 VITALS — BP 160/99 | HR 77 | Temp 98.3°F | Resp 16 | Ht 75.0 in | Wt 333.0 lb

## 2018-08-30 DIAGNOSIS — Z79899 Other long term (current) drug therapy: Secondary | ICD-10-CM | POA: Insufficient documentation

## 2018-08-30 DIAGNOSIS — R079 Chest pain, unspecified: Secondary | ICD-10-CM | POA: Insufficient documentation

## 2018-08-30 DIAGNOSIS — Z85828 Personal history of other malignant neoplasm of skin: Secondary | ICD-10-CM | POA: Insufficient documentation

## 2018-08-30 DIAGNOSIS — E119 Type 2 diabetes mellitus without complications: Secondary | ICD-10-CM | POA: Insufficient documentation

## 2018-08-30 DIAGNOSIS — E1165 Type 2 diabetes mellitus with hyperglycemia: Secondary | ICD-10-CM | POA: Diagnosis not present

## 2018-08-30 DIAGNOSIS — R51 Headache: Secondary | ICD-10-CM | POA: Diagnosis present

## 2018-08-30 DIAGNOSIS — Z7982 Long term (current) use of aspirin: Secondary | ICD-10-CM | POA: Insufficient documentation

## 2018-08-30 DIAGNOSIS — I10 Essential (primary) hypertension: Secondary | ICD-10-CM

## 2018-08-30 DIAGNOSIS — R519 Headache, unspecified: Secondary | ICD-10-CM

## 2018-08-30 LAB — CBC
HCT: 41.2 % (ref 39.0–52.0)
Hemoglobin: 14.3 g/dL (ref 13.0–17.0)
MCH: 29.1 pg (ref 26.0–34.0)
MCHC: 34.7 g/dL (ref 30.0–36.0)
MCV: 83.9 fL (ref 80.0–100.0)
Platelets: 236 10*3/uL (ref 150–400)
RBC: 4.91 MIL/uL (ref 4.22–5.81)
RDW: 13.1 % (ref 11.5–15.5)
WBC: 9.1 10*3/uL (ref 4.0–10.5)
nRBC: 0 % (ref 0.0–0.2)

## 2018-08-30 LAB — BASIC METABOLIC PANEL
Anion gap: 9 (ref 5–15)
BUN: 13 mg/dL (ref 6–20)
CO2: 25 mmol/L (ref 22–32)
Calcium: 9 mg/dL (ref 8.9–10.3)
Chloride: 104 mmol/L (ref 98–111)
Creatinine, Ser: 0.81 mg/dL (ref 0.61–1.24)
GFR calc Af Amer: >60 mL/min (ref >60)
GFR calc non Af Amer: >60 mL/min (ref >60)
Glucose, Bld: 236 mg/dL — ABNORMAL HIGH (ref 70–99)
Potassium: 4 mmol/L (ref 3.5–5.1)
Sodium: 138 mmol/L (ref 135–145)

## 2018-08-30 LAB — TROPONIN I (HIGH SENSITIVITY): Troponin I (High Sensitivity): 4 ng/L (ref <18)

## 2018-08-30 MED ORDER — ACETAMINOPHEN 325 MG PO TABS
650.0000 mg | ORAL_TABLET | Freq: Once | ORAL | Status: AC
  Administered 2018-08-30: 650 mg via ORAL
  Filled 2018-08-30: qty 2

## 2018-08-30 MED ORDER — KETOROLAC TROMETHAMINE 30 MG/ML IJ SOLN
30.0000 mg | Freq: Once | INTRAMUSCULAR | Status: AC
  Administered 2018-08-30: 30 mg via INTRAVENOUS
  Filled 2018-08-30: qty 1

## 2018-08-30 MED ORDER — METOCLOPRAMIDE HCL 5 MG/ML IJ SOLN
10.0000 mg | Freq: Once | INTRAMUSCULAR | Status: AC
  Administered 2018-08-30: 17:00:00 10 mg via INTRAVENOUS
  Filled 2018-08-30: qty 2

## 2018-08-30 NOTE — ED Notes (Signed)
E-signature not working at this time. Pt verbalized understanding of D/C instructions and follow up care. No further questions at this time. Pt ambulatory to lobby and in NAD at time of D/C.

## 2018-08-30 NOTE — ED Notes (Signed)
Pt refusing DC vital signs

## 2018-08-30 NOTE — ED Triage Notes (Signed)
Pt to ED reporting a headache with elevated BP for the past week and a half. Pt was seen at PCP and told to come to ED for abnormalities seen in EKG. Pt reports the headache is the worst of all his symptoms but he is also having chest pain when laying flat or while sleeping.

## 2018-08-30 NOTE — Progress Notes (Signed)
[facility] [place],  [address]  Internal MEDICINE  Office Visit Note  Patient Name: Gerald [name]  [id number]  [id number]  Date of Service: 08/30/2018  Chief Complaint  Patient presents with  . Medical Management of Chronic Issues    4 week follow up   . Hypertension  . Headache  . Shortness of Breath  . Chest Pain    HPI Pt is here for follow up on HTN, headaches, and sob.  Pt reports he has been having chest pain for 7-10 days.  He reports he has been having high blood pressures 180's/115-120's. Also he is having headaches at times.  The most concerning thing is a dull chest pain that turns sharp at times.  He reports he was even woke up from sleep with a sharp chest pain.  He has seen cardiology in the past, however its been awhile.  He had a cath in the past, but denies any stent placement.  EKG in office shows some artifact/abnormailty.        Current Medication: Outpatient Encounter Medications as of 08/30/2018  Medication Sig  . acetaminophen (TYLENOL) 325 MG tablet Take 325-650 mg by mouth every 6 (six) hours as needed for mild pain or headache.  . albuterol (PROVENTIL HFA;VENTOLIN HFA) 108 (90 Base) MCG/ACT inhaler Inhale 2 puffs into the lungs every 6 (six) hours as needed for wheezing or shortness of breath.  Marland Kitchen aspirin 81 MG EC tablet Take 1 tablet (81 mg total) by mouth daily.  . citalopram (CELEXA) 40 MG tablet TAKE 1 TABLET BY MOUTH EVERY DAY FOR GAD  . Dulaglutide (TRULICITY) 1.5 FU/9.3AT SOPN Inject 1.5 mg into the skin every Wednesday.  . Insulin Detemir (LEVEMIR FLEXTOUCH) 100 UNIT/ML Pen Inject 50units [place] daily. (Patient taking differently: Inject 50 Units into the skin every evening. Inject 50units [place] daily.)  . lisinopril-hydrochlorothiazide (ZESTORETIC) 20-25 MG tablet Take 1 tablet by mouth 2 (two) times a day.  . nitroGLYCERIN (NITROSTAT) 0.4 MG SL tablet Place 1 tablet (0.4 mg total) under the tongue every 5 (five)  minutes x 3 doses as needed for chest pain.  . pantoprazole (PROTONIX) 40 MG tablet Take 1 tablet (40 mg total) by mouth 2 (two) times daily before a meal.  . [DISCONTINUED] carvedilol (COREG) 3.125 MG tablet Take one tab po bid for HTN (Patient not taking: Reported on 07/26/2018)   No facility-administered encounter medications on file as of 08/30/2018.     Surgical History: Past Surgical History:  Procedure Laterality Date  . CARDIAC CATHETERIZATION N/A 09/2011   [facility]; EF 50% with 30% mid LAD stenosis and no obstructive disease.  Marland Kitchen CARDIAC CATHETERIZATION  09/2010   [facility]; Mid LAD 40% stenosis; Mid Circumflex:Normal; Mid RCA; Normal  . CARDIAC CATHETERIZATION    . COLONOSCOPY    . ESOPHAGOGASTRODUODENOSCOPY (EGD) WITH PROPOFOL N/A 06/10/2017   Procedure: ESOPHAGOGASTRODUODENOSCOPY (EGD) WITH PROPOFOL;  Surgeon: Jonathon Bellows, MD;  Location: [facility] ENDOSCOPY;  Service: Gastroenterology;  Laterality: N/A;  . KNEE ARTHROSCOPY WITH MEDIAL MENISECTOMY Right 09/16/2012   Procedure: RIGHT KNEE ARTHROSCOPY WITH MEDIAL AND LATERAL MENISECTOMY, CHONDROPLASTY;  Surgeon: Ninetta Lights, MD;  Location: [place];  Service: Orthopedics;  Laterality: Right;  RIGHT KNEE SCOPE MEDIAL MENISCECTOMY  . LEFT HEART CATH AND CORONARY ANGIOGRAPHY N/A 06/11/2016   Procedure: Left Heart Cath and Coronary Angiography;  Surgeon: Minna Merritts, MD;  Location: [place] CV LAB;  Service: Cardiovascular;  Laterality: N/A;    Medical  History: Past Medical History:  Diagnosis Date  . Abnormal LFTs   . Carcinoma ([place])   . Diabetes mellitus without complication (HCC)    borderline  . Fundic gland polyps of stomach, benign   . Gastritis   . GERD (gastroesophageal reflux disease)   . Hepatic steatosis   . Hyperlipidemia   . Hypertension   . Nonischemic cardiomyopathy (HCC)    Previous ejection fraction of 35-40% on echo. 50% on cardiac catheterization in August of 2013  . Obesity   . Sleep apnea      Family History: Family History  Problem Relation Age of Onset  . Heart disease Father 98       MI  . Heart attack Father   . Stomach cancer Father   . Hypertension Mother   . Breast cancer Mother     Social History   Socioeconomic History  . Marital status: Married    Spouse name: Not on file  . Number of children: 3  . Years of education: Not on file  . Highest education level: Not on file  Occupational History  . Occupation: Librarian, academic  Social Needs  . Financial resource strain: Not on file  . Food insecurity    Worry: Not on file    Inability: Not on file  . Transportation needs    Medical: Not on file    Non-medical: Not on file  Tobacco Use  . Smoking status: Never Smoker  . Smokeless tobacco: Never Used  Substance and Sexual Activity  . Alcohol use: Not Currently    Alcohol/week: 0.0 standard drinks    Comment: rare  . Drug use: No  . Sexual activity: Not on file  Lifestyle  . Physical activity    Days per week: Not on file    Minutes per session: Not on file  . Stress: Not on file  Relationships  . Social Herbalist on phone: Not on file    Gets together: Not on file    Attends religious service: Not on file    Active member of club or organization: Not on file    Attends meetings of clubs or organizations: Not on file    Relationship status: Not on file  . Intimate partner violence    Fear of current or ex partner: Not on file    Emotionally abused: Not on file    Physically abused: Not on file    Forced sexual activity: Not on file  Other Topics Concern  . Not on file  Social History Narrative   Married.  52 yo son.   Wife on disability for bipolar disorder.        Review of Systems  Constitutional: Negative.  Negative for chills, fatigue and unexpected weight change.  HENT: Negative.  Negative for congestion, rhinorrhea, sneezing and sore throat.   Eyes: Negative for redness.  Respiratory: Negative.  Negative for  cough, chest tightness and shortness of breath.   Cardiovascular: Positive for chest pain. Negative for palpitations.       HTN  Gastrointestinal: Negative.  Negative for abdominal pain, constipation, diarrhea, nausea and vomiting.  Endocrine: Negative.   Genitourinary: Negative.  Negative for dysuria and frequency.  Musculoskeletal: Negative.  Negative for arthralgias, back pain, joint swelling and neck pain.  Skin: Negative.  Negative for rash.  Allergic/Immunologic: Negative.   Neurological: Positive for headaches. Negative for tremors and numbness.  Hematological: Negative for adenopathy. Does not bruise/bleed easily.  Psychiatric/Behavioral: Negative.  Negative for behavioral problems, sleep disturbance and suicidal ideas. The patient is not nervous/anxious.     Vital Signs: BP (!) 160/99   Pulse 77   Temp 98.3 F (36.8 C)   Resp 16   Ht 6\' 3"  (1.905 m)   Wt (!) 333 lb (151 kg)   SpO2 96%   BMI 41.62 kg/m    Physical Exam Vitals signs and nursing note reviewed.  Constitutional:      General: He is not in acute distress.    Appearance: He is well-developed. He is not diaphoretic.  HENT:     Head: Normocephalic and atraumatic.     Mouth/Throat:     Pharynx: No oropharyngeal exudate.  Eyes:     Pupils: Pupils are equal, round, and reactive to light.  Neck:     Musculoskeletal: Normal range of motion and neck supple.     Thyroid: No thyromegaly.     Vascular: No JVD.     Trachea: No tracheal deviation.  Cardiovascular:     Rate and Rhythm: Normal rate and regular rhythm.     Heart sounds: Normal heart sounds. No murmur. No friction rub. No gallop.   Pulmonary:     Effort: Pulmonary effort is normal. No respiratory distress.     Breath sounds: Normal breath sounds. No wheezing or rales.  Chest:     Chest wall: No tenderness.  Abdominal:     Palpations: Abdomen is soft.     Tenderness: There is no abdominal tenderness. There is no guarding.  Musculoskeletal:  Normal range of motion.  Lymphadenopathy:     Cervical: No cervical adenopathy.  Skin:    General: Skin is warm and dry.  Neurological:     Mental Status: He is alert and oriented to person, place, and time.     Cranial Nerves: No cranial nerve deficit.  Psychiatric:        Behavior: Behavior normal.        Thought Content: Thought content normal.        Judgment: Judgment normal.    Assessment/Plan: 1. Chest pain, unspecified type Given patients history, and comorbidities it seems prudent to have him go to the ED for evaluation of this ongoing chest pain.  Pt was cathed in the past, and may need reevaluation at this time.   - EKG 12-Lead  2. Essential hypertension Elevated today.  Unable to take coreg, due to feeling bad when taking it.  3. Uncontrolled type 2 diabetes mellitus with hyperglycemia ([place]) Pt will need to add Farxiga or invokana, will discuss at next visit.   4. Morbid obesity ([place]) Obesity Counseling: Risk Assessment: An assessment of behavioral risk factors was made today and includes lack of exercise sedentary lifestyle, lack of portion control and poor dietary habits.  Risk Modification Advice: She was counseled on portion control guidelines. Restricting daily caloric intake to. . The detrimental long term effects of obesity on her health and ongoing poor compliance was also discussed with the patient.    General Counseling: lavan imes understanding of the findings of todays visit and agrees with plan of treatment. I have discussed any further diagnostic evaluation that may be needed or ordered today. We also reviewed his medications today. he has been encouraged to call the office with any questions or concerns that should arise related to todays visit.    Orders Placed This Encounter  Procedures  . EKG 12-Lead    No orders of the defined types were placed  in this encounter.   Time spent: 20 Minutes   This patient was seen by Orson Gear  AGNP-C in Collaboration with Dr Lavera Guise as a part of collaborative care agreement     Kendell Bane AGNP-C Internal medicine

## 2018-08-30 NOTE — ED Notes (Signed)
Pt transported to CT ?

## 2018-08-30 NOTE — Discharge Instructions (Signed)
Your cardiac markers were negative for heart attack however given your high risk for having cardiac disease you should follow-up with cardiology within 2 to 3 days.  Please call them to make an appointment.  Your CT head was also normal.

## 2018-08-30 NOTE — ED Notes (Signed)
Pt wishes to go home at this time. MD aware and at bedside.

## 2018-08-30 NOTE — ED Provider Notes (Signed)
[facility] Emergency Department Provider Note  ____________________________________________   First MD Initiated Contact with Patient 08/30/18 1615     (approximate)  I have reviewed the triage vital signs and the nursing notes.   HISTORY  Chief Complaint Headache and Chest Pain    HPI Gerald [name] is a 53 y.o. male with diabetes, gastritis, hypertension, hyperlipidemia, nonischemic cardiomyopathy who presents with chest pain and headache.  For patient's chest pain he has had a few weeks now of intermittent, stabbing sensation in his chest that lasted few minutes.  It can occur at rest or while he is doing something.  It gets better on its own after a few minutes.  He has had a cath in 2013 with some minimal disease but nothing that required stent placement.  He also has had a headache this been going on but on the right side of his head, wakes him up from sleep, associated with some injection of his right eye.  Denies any vision changes other than eye.  Denies any fevers.          Past Medical History:  Diagnosis Date  . Abnormal LFTs   . Carcinoma ([place])   . Diabetes mellitus without complication (HCC)    borderline  . Fundic gland polyps of stomach, benign   . Gastritis   . GERD (gastroesophageal reflux disease)   . Hepatic steatosis   . Hyperlipidemia   . Hypertension   . Nonischemic cardiomyopathy (HCC)    Previous ejection fraction of 35-40% on echo. 50% on cardiac catheterization in August of 2013  . Obesity   . Sleep apnea     Patient Active Problem List   Diagnosis Date Noted  . Chest pain 01/08/2018  . Gastroesophageal reflux disease without esophagitis 08/23/2017  . Urinary tract infection without hematuria 06/14/2017  . Acute non-recurrent sinusitis 06/14/2017  . Generalized anxiety disorder 02/02/2017  . Other fatigue 02/02/2017  . Abnormal results of liver function studies 02/02/2017  . Myalgia 02/02/2017  . Edema,  unspecified 02/02/2017  . Circadian rhythm sleep disorder, shift work type 02/02/2017  . Acne vulgaris 02/02/2017  . Occlusion and stenosis of bilateral carotid arteries 02/02/2017  . Abnormal weight gain 02/02/2017  . Pain in unspecified knee 02/02/2017  . Neoplasm of uncertain behavior of skin 02/02/2017  . Hypersomnia, unspecified 02/02/2017  . Shortness of breath 02/02/2017  . Morbid obesity ([place]) 07/07/2016  . Atypical chest pain 06/08/2016  . Other chest pain 05/22/2014  . Mass of jaw 05/22/2014  . Right flank pain 11/23/2013  . Dysuria 11/23/2013  . Encounter for wellness examination 11/08/2013  . Retaining fluid 11/08/2013  . Urinary incontinence 03/14/2013  . Depression 01/13/2013  . Type 2 diabetes mellitus ([place]) 09/14/2012  . Nonischemic cardiomyopathy ([place])   . Mixed hyperlipidemia 05/28/2012  . Chronic pain syndrome 03/30/2012  . HTN (hypertension) 03/30/2012    Past Surgical History:  Procedure Laterality Date  . CARDIAC CATHETERIZATION N/A 09/2011   [facility]; EF 50% with 30% mid LAD stenosis and no obstructive disease.  Marland Kitchen CARDIAC CATHETERIZATION  09/2010   [facility]; Mid LAD 40% stenosis; Mid Circumflex:Normal; Mid RCA; Normal  . CARDIAC CATHETERIZATION    . COLONOSCOPY    . ESOPHAGOGASTRODUODENOSCOPY (EGD) WITH PROPOFOL N/A 06/10/2017   Procedure: ESOPHAGOGASTRODUODENOSCOPY (EGD) WITH PROPOFOL;  Surgeon: Jonathon Bellows, MD;  Location: [facility] ENDOSCOPY;  Service: Gastroenterology;  Laterality: N/A;  . KNEE ARTHROSCOPY WITH MEDIAL MENISECTOMY Right 09/16/2012   Procedure: RIGHT KNEE ARTHROSCOPY WITH  MEDIAL AND LATERAL MENISECTOMY, CHONDROPLASTY;  Surgeon: Ninetta Lights, MD;  Location: [place];  Service: Orthopedics;  Laterality: Right;  RIGHT KNEE SCOPE MEDIAL MENISCECTOMY  . LEFT HEART CATH AND CORONARY ANGIOGRAPHY N/A 06/11/2016   Procedure: Left Heart Cath and Coronary Angiography;  Surgeon: Minna Merritts, MD;  Location: [place] CV LAB;  Service:  Cardiovascular;  Laterality: N/A;    Prior to Admission medications   Medication Sig Start Date End Date Taking? Authorizing Provider  acetaminophen (TYLENOL) 325 MG tablet Take 325-650 mg by mouth every 6 (six) hours as needed for mild pain or headache.    [provider]  albuterol (PROVENTIL HFA;VENTOLIN HFA) 108 (90 Base) MCG/ACT inhaler Inhale 2 puffs into the lungs every 6 (six) hours as needed for wheezing or shortness of breath. 01/12/18   Kendell Bane, NP  aspirin 81 MG EC tablet Take 1 tablet (81 mg total) by mouth daily. 01/10/18   Fritzi Mandes, MD  citalopram (CELEXA) 40 MG tablet TAKE 1 TABLET BY MOUTH EVERY DAY FOR GAD 07/30/17   Ronnell Freshwater, NP  Dulaglutide (TRULICITY) 1.5 JI/9.6VE SOPN Inject 1.5 mg into the skin every Wednesday. 06/02/18   Ronnell Freshwater, NP  Insulin Detemir (LEVEMIR FLEXTOUCH) 100 UNIT/ML Pen Inject 50units [place] daily. Patient taking differently: Inject 50 Units into the skin every evening. Inject 50units [place] daily. 11/05/17   Ronnell Freshwater, NP  lisinopril-hydrochlorothiazide (ZESTORETIC) 20-25 MG tablet Take 1 tablet by mouth 2 (two) times a day. 07/26/18   Kendell Bane, NP  nitroGLYCERIN (NITROSTAT) 0.4 MG SL tablet Place 1 tablet (0.4 mg total) under the tongue every 5 (five) minutes x 3 doses as needed for chest pain. 01/09/18   Fritzi Mandes, MD  pantoprazole (PROTONIX) 40 MG tablet Take 1 tablet (40 mg total) by mouth 2 (two) times daily before a meal. 01/09/18   Fritzi Mandes, MD    Allergies Statins  Family History  Problem Relation Age of Onset  . Heart disease Father 11       MI  . Heart attack Father   . Stomach cancer Father   . Hypertension Mother   . Breast cancer Mother     Social History Social History   Tobacco Use  . Smoking status: Never Smoker  . Smokeless tobacco: Never Used  Substance Use Topics  . Alcohol use: Not Currently    Alcohol/week: 0.0 standard drinks    Comment: rare  . Drug use: No       Review of Systems Constitutional: No fever/chills Eyes: No visual changes. ENT: No sore throat. Cardiovascular: Positive chest pain Respiratory: Denies shortness of breath. Gastrointestinal: No abdominal pain.  No nausea, no vomiting.  No diarrhea.  No constipation. Genitourinary: Negative for dysuria. Musculoskeletal: Negative for back pain. Skin: Negative for rash. Neurological: Positive for headaches.  Focal weakness or numbness. All other ROS negative ____________________________________________   PHYSICAL EXAM:  VITAL SIGNS: ED Triage Vitals  Enc Vitals Group     BP 08/30/18 1504 (!) 159/62     Pulse Rate 08/30/18 1504 65     Resp 08/30/18 1504 16     Temp 08/30/18 1504 98.2 F (36.8 C)     Temp Source 08/30/18 1504 Oral     SpO2 08/30/18 1504 99 %     Weight 08/30/18 1443 (!) 334 lb (151.5 kg)     Height 08/30/18 1443 6\' 3"  (1.905 m)     Head Circumference --  Peak Flow --      Pain Score 08/30/18 1442 10     Pain Loc --      Pain Edu? --      Excl. in [place]? --     Constitutional: Alert and oriented. Well appearing and in no acute distress. Eyes: Conjunctivae are normal. EOMI. reactive pupils bilaterally Head: Atraumatic. Nose: No congestion/rhinnorhea. Mouth/Throat: Mucous membranes are moist.   Neck: No stridor. Trachea Midline. FROM Cardiovascular: Normal rate, regular rhythm. Grossly normal heart sounds.  Good peripheral circulation. Respiratory: Normal respiratory effort.  No retractions. Lungs CTAB. Gastrointestinal: Soft and nontender. No distention. No abdominal bruits.  Musculoskeletal: No lower extremity tenderness nor edema.  No joint effusions. Neurologic:  Normal speech and language. No gross focal neurologic deficits are appreciated.  Skin:  Skin is warm, dry and intact. No rash noted. Psychiatric: Mood and affect are normal. Speech and behavior are normal. GU: Deferred   ____________________________________________   LABS (all labs  ordered are listed, but only abnormal results are displayed)  Labs Reviewed  BASIC METABOLIC PANEL - Abnormal; Notable for the following components:      Result Value   Glucose, Bld 236 (*)    All other components within normal limits  CBC  TROPONIN I (HIGH SENSITIVITY)  TROPONIN I (HIGH SENSITIVITY)   ____________________________________________   ED ECG REPORT I, Vanessa [place], the attending physician, personally viewed and interpreted this ECG.  EKG normal sinus rate of 79, no ST elevation, T wave inversion in lead III, type I AV block.  This EKG looks similar to prior EKGs. ____________________________________________  RADIOLOGY Robert Bellow, personally viewed and evaluated these images (plain radiographs) as part of my medical decision making, as well as reviewing the written report by the radiologist.  ED MD interpretation: Chest x-ray negative for pneumonia  Official radiology report(s): Dg Chest 2 View  Result Date: 08/30/2018 CLINICAL DATA:  Chest pain and headache. EXAM: CHEST - 2 VIEW COMPARISON:  07/03/2018 FINDINGS: The heart size and mediastinal contours are within normal limits. Both lungs are clear. The visualized skeletal structures are unremarkable. IMPRESSION: Negative.  No active cardiopulmonary disease. Electronically Signed   By: Marlaine Hind M.D.   On: 08/30/2018 16:28   Ct Head Wo Contrast  Result Date: 08/30/2018 CLINICAL DATA:  Headache.  Hypertension EXAM: CT HEAD WITHOUT CONTRAST TECHNIQUE: Contiguous axial images were obtained from the base of the skull through the vertex without intravenous contrast. COMPARISON:  CT head 07/17/2017 FINDINGS: Brain: No evidence of acute infarction, hemorrhage, hydrocephalus, extra-axial collection or mass lesion/mass effect. Vascular: Negative for hyperdense vessel Skull: Negative Sinuses/Orbits: Negative Other: None IMPRESSION: Negative CT head Electronically Signed   By: Franchot Gallo M.D.   On: 08/30/2018 16:45     ____________________________________________   PROCEDURES  Procedure(s) performed (including Critical Care):  Procedures   ____________________________________________   INITIAL IMPRESSION / ASSESSMENT AND PLAN / ED COURSE   Gerald [name] was evaluated in Emergency Department on 08/30/2018 for the symptoms described in the history of present illness. He was evaluated in the context of the global COVID-19 pandemic, which necessitated consideration that the patient might be at risk for infection with the SARS-CoV-2 virus that causes COVID-19. Institutional protocols and algorithms that pertain to the evaluation of patients at risk for COVID-19 are in a state of rapid change based on information released by regulatory bodies including the CDC and federal and state organizations. These policies and algorithms were followed during the patient's  care in the ED.    Most Likely DDx:  -MSK (atypical chest pain) but will get cardiac markers to evaluate for ACS given risk factors/age.  Given patient's headache wakes him up from sleep will get CT head to rule out intracranial hemorrhage versus tumor.  Patient has normal neuro exam.  Denies any vision changes and has a reactive pupils low suspicion for this being secondary to glaucoma or other eye problems.  Headache was not maximal in onset of low suspicion for subarachnoid hemorrhage.   DDx that was also considered d/t potential to cause harm, but was found less likely based on history and physical (as detailed above): -PNA (no fevers, cough but CXR to evaluate) -PNX (reassured with equal b/l breath sounds, CXR to evaluate) -Symptomatic anemia (will get H&H) -Pulmonary embolism as no sob at rest, not pleuritic in nature, no hypoxia -Aortic Dissection as no tearing pain and no radiation to the mid back, pulses equal -Pericarditis no rub on exam, EKG changes or hx to suggest dx -Tamponade (no notable SOB, tachycardic, hypotensive) -Esophageal  rupture (no h/o diffuse vomitting/no crepitus)   Troponin was 4.  Labs notable for slightly elevated glucose at 236 but no evidence of DKA.  No evidence of anemia.  CT head negative  Patient requesting to leave.  Patient says headache is feeling better.  Given patient cardiology number and explained that he is to follow-up in 2 to 3 days for cardiology appointment, consideration of stress test.  Patient understands the importance of following up with cardiology.  ____________________________________________   FINAL CLINICAL IMPRESSION(S) / ED DIAGNOSES   Final diagnoses:  Bad headache  Chest pain, unspecified type     MEDICATIONS GIVEN DURING THIS VISIT:  Medications  ketorolac (TORADOL) 30 MG/ML injection 30 mg (30 mg Intravenous Given 08/30/18 1647)  acetaminophen (TYLENOL) tablet 650 mg (650 mg Oral Given 08/30/18 1647)  metoCLOPramide (REGLAN) injection 10 mg (10 mg Intravenous Given 08/30/18 1648)     ED Discharge Orders    None       Note:  This document was prepared using Dragon voice recognition software and may include unintentional dictation errors.   Vanessa Carlton, MD 08/30/18 [phone]

## 2018-09-03 ENCOUNTER — Encounter: Payer: Self-pay | Admitting: Nurse Practitioner

## 2018-09-03 ENCOUNTER — Other Ambulatory Visit: Payer: Self-pay

## 2018-09-03 ENCOUNTER — Ambulatory Visit: Payer: 59 | Admitting: Nurse Practitioner

## 2018-09-03 VITALS — BP 144/67 | HR 73 | Ht 75.0 in | Wt 334.0 lb

## 2018-09-03 DIAGNOSIS — Z20828 Contact with and (suspected) exposure to other viral communicable diseases: Secondary | ICD-10-CM | POA: Diagnosis not present

## 2018-09-03 DIAGNOSIS — Z20822 Contact with and (suspected) exposure to covid-19: Secondary | ICD-10-CM

## 2018-09-03 DIAGNOSIS — R112 Nausea with vomiting, unspecified: Secondary | ICD-10-CM | POA: Diagnosis not present

## 2018-09-03 DIAGNOSIS — J069 Acute upper respiratory infection, unspecified: Secondary | ICD-10-CM | POA: Diagnosis not present

## 2018-09-03 MED ORDER — AZITHROMYCIN 250 MG PO TABS: ORAL_TABLET | ORAL | 0 refills | Status: DC

## 2018-09-03 MED ORDER — ONDANSETRON 8 MG PO TBDP: 8.0000 mg | ORAL_TABLET | Freq: Three times a day (TID) | ORAL | 0 refills | Status: DC | PRN

## 2018-09-03 NOTE — Progress Notes (Signed)
[facility] [place], [place] [address]  Internal MEDICINE  Telephone Visit  Patient Name: ROMELLE REILEY  [id number]  [id number]  Date of Service: 09/03/2018  I connected with the patient at 10:47am by webcam  and verified the patients identity using two identifiers.   I discussed the limitations, risks, security and privacy concerns of performing an evaluation and management service by webcam and the availability of in person appointments. I also discussed with the patient that there may be a patient responsible charge related to the service.  The patient expressed understanding and agrees to proceed.    Chief Complaint  Patient presents with  . Telephone Assessment  . Telephone Screen  . Migraine  . Nausea  . Fatigue    pt wife said he feels warm but they do not have a thermometer  . Vomiting    The patient has been contacted via webcam for follow up visit due to concerns for spread of novel coronavirus. The patient has had nausea with vomiting and really bad headaches. Was at work yesterday and was sent home. He has not checked temperature, but his wife states he has been feeling warm. He has had chest discomfort for a little over a week. He is not aware of any exposure to COVID 19. He is severely fatigued. Can hardly keep his eyes open.       Current Medication: Outpatient Encounter Medications as of 09/03/2018  Medication Sig  . acetaminophen (TYLENOL) 325 MG tablet Take 325-650 mg by mouth every 6 (six) hours as needed for mild pain or headache.  . albuterol (PROVENTIL HFA;VENTOLIN HFA) 108 (90 Base) MCG/ACT inhaler Inhale 2 puffs into the lungs every 6 (six) hours as needed for wheezing or shortness of breath.  Marland Kitchen aspirin 81 MG EC tablet Take 1 tablet (81 mg total) by mouth daily.  . citalopram (CELEXA) 40 MG tablet TAKE 1 TABLET BY MOUTH EVERY DAY FOR GAD  . Dulaglutide (TRULICITY) 1.5 OV/7.8HY SOPN Inject 1.5 mg into the skin every Wednesday.   . Insulin Detemir (LEVEMIR FLEXTOUCH) 100 UNIT/ML Pen Inject 50units [place] daily. (Patient taking differently: Inject 50 Units into the skin every evening. Inject 50units [place] daily.)  . lisinopril-hydrochlorothiazide (ZESTORETIC) 20-25 MG tablet Take 1 tablet by mouth 2 (two) times a day.  . nitroGLYCERIN (NITROSTAT) 0.4 MG SL tablet Place 1 tablet (0.4 mg total) under the tongue every 5 (five) minutes x 3 doses as needed for chest pain.  . pantoprazole (PROTONIX) 40 MG tablet Take 1 tablet (40 mg total) by mouth 2 (two) times daily before a meal.  . azithromycin (ZITHROMAX) 250 MG tablet z-pack - take as directed for 5 days for upper respiratory infection.  . ondansetron (ZOFRAN-ODT) 8 MG disintegrating tablet Take 1 tablet (8 mg total) by mouth every 8 (eight) hours as needed for nausea or vomiting.   No facility-administered encounter medications on file as of 09/03/2018.     Surgical History: Past Surgical History:  Procedure Laterality Date  . CARDIAC CATHETERIZATION N/A 09/2011   [facility]; EF 50% with 30% mid LAD stenosis and no obstructive disease.  Marland Kitchen CARDIAC CATHETERIZATION  09/2010   [facility]; Mid LAD 40% stenosis; Mid Circumflex:Normal; Mid RCA; Normal  . CARDIAC CATHETERIZATION    . COLONOSCOPY    . ESOPHAGOGASTRODUODENOSCOPY (EGD) WITH PROPOFOL N/A 06/10/2017   Procedure: ESOPHAGOGASTRODUODENOSCOPY (EGD) WITH PROPOFOL;  Surgeon: Jonathon Bellows, MD;  Location: [facility] ENDOSCOPY;  Service: Gastroenterology;  Laterality: N/A;  . KNEE ARTHROSCOPY  WITH MEDIAL MENISECTOMY Right 09/16/2012   Procedure: RIGHT KNEE ARTHROSCOPY WITH MEDIAL AND LATERAL MENISECTOMY, CHONDROPLASTY;  Surgeon: Ninetta Lights, MD;  Location: [facility];  Service: Orthopedics;  Laterality: Right;  RIGHT KNEE SCOPE MEDIAL MENISCECTOMY  . LEFT HEART CATH AND CORONARY ANGIOGRAPHY N/A 06/11/2016   Procedure: Left Heart Cath and Coronary Angiography;  Surgeon: Minna Merritts, MD;  Location: [facility];  Service:  Cardiovascular;  Laterality: N/A;    Medical History: Past Medical History:  Diagnosis Date  . Abnormal LFTs   . Carcinoma ([place])   . Diabetes mellitus without complication (HCC)    borderline  . Fundic gland polyps of stomach, benign   . Gastritis   . GERD (gastroesophageal reflux disease)   . Hepatic steatosis   . Hyperlipidemia   . Hypertension   . Nonischemic cardiomyopathy (HCC)    Previous ejection fraction of 35-40% on echo. 50% on cardiac catheterization in August of 2013  . Obesity   . Sleep apnea     Family History: Family History  Problem Relation Age of Onset  . Heart disease Father 70       MI  . Heart attack Father   . Stomach cancer Father   . Hypertension Mother   . Breast cancer Mother     Social History   Socioeconomic History  . Marital status: Married    Spouse name: Not on file  . Number of children: 3  . Years of education: Not on file  . Highest education level: Not on file  Occupational History  . Occupation: Librarian, academic  Social Needs  . Financial resource strain: Not on file  . Food insecurity    Worry: Not on file    Inability: Not on file  . Transportation needs    Medical: Not on file    Non-medical: Not on file  Tobacco Use  . Smoking status: Never Smoker  . Smokeless tobacco: Never Used  Substance and Sexual Activity  . Alcohol use: Not Currently    Alcohol/week: 0.0 standard drinks    Comment: rare  . Drug use: No  . Sexual activity: Not on file  Lifestyle  . Physical activity    Days per week: Not on file    Minutes per session: Not on file  . Stress: Not on file  Relationships  . Social Herbalist on phone: Not on file    Gets together: Not on file    Attends religious service: Not on file    Active member of club or organization: Not on file    Attends meetings of clubs or organizations: Not on file    Relationship status: Not on file  . Intimate partner violence    Fear of current or ex  partner: Not on file    Emotionally abused: Not on file    Physically abused: Not on file    Forced sexual activity: Not on file  Other Topics Concern  . Not on file  Social History Narrative   Married.  3 yo son.   Wife on disability for bipolar disorder.        Review of Systems  Constitutional: Positive for chills, fatigue and fever. Negative for unexpected weight change.  HENT: Positive for congestion, postnasal drip, rhinorrhea, sinus pressure and sinus pain. Negative for sneezing and sore throat.   Eyes: Negative for redness.  Respiratory: Positive for cough and chest tightness. Negative for shortness of breath.  Cardiovascular: Positive for chest pain. Negative for palpitations.  Gastrointestinal: Positive for nausea and vomiting. Negative for abdominal pain, constipation and diarrhea.  Musculoskeletal: Positive for arthralgias and myalgias. Negative for back pain, joint swelling and neck pain.  Skin: Negative for rash.  Neurological: Positive for headaches. Negative for tremors and numbness.  Hematological: Negative for adenopathy. Does not bruise/bleed easily.  Psychiatric/Behavioral: Negative for behavioral problems (Depression), sleep disturbance and suicidal ideas. The patient is not nervous/anxious.     Today's Vitals   09/03/18 1027  BP: (!) 144/67  Pulse: 73  Weight: (!) 334 lb (151.5 kg)  Height: 6\' 3"  (1.905 m)   Body mass index is 41.75 kg/m.  Observation/Objective:  The patient is alert and oriented. He is pleasant and answering all questions appropriately. Breathing is non-labored. He is in no acute distress. The patient is ill appearing. Sounds nasally congested. He has eyes closed throughout the visit .    Assessment/Plan:  1. Acute upper respiratory infection Start z-pack. Take as directed for 5 days. Rest and drink plenty of fluid. Take OTC tylenol as needed for fever and muscle aches.  - azithromycin (ZITHROMAX) 250 MG tablet; z-pack - take as  directed for 5 days for upper respiratory infection.  Dispense: 6 tablet; Refill: 0  2. Nausea and vomiting, intractability of vomiting not specified, unspecified vomiting type zofran 8mg  ODT tablets may be taken up to three times daily as needed for nausea and vomiting.  - ondansetron (ZOFRAN-ODT) 8 MG disintegrating tablet; Take 1 tablet (8 mg total) by mouth every 8 (eight) hours as needed for nausea or vomiting.  Dispense: 30 tablet; Refill: 0  3. Exposure to Covid-19 Virus Send for COVID 19 testing.  - Novel Coronavirus, NAA ([facility])  General Counseling: [name] understanding of the findings of today's phone visit and agrees with plan of treatment. I have discussed any further diagnostic evaluation that may be needed or ordered today. We also reviewed his medications today. he has been encouraged to call the office with any questions or concerns that should arise related to todays visit.     Person Under Monitoring Name: OTHNIEL MARET  Location: [address] [place] [address]   Infection Prevention Recommendations for Individuals Confirmed to have, or Being Evaluated for, 2019 Novel Coronavirus (COVID-19) Infection Who Receive Care at Home  Individuals who are confirmed to have, or are being evaluated for, COVID-19 should follow the prevention steps below until a healthcare provider or local or state health department says they can return to normal activities.  Stay home except to get medical care You should restrict activities outside your home, except for getting medical care. Do not go to work, school, or public areas, and do not use public transportation or taxis.  Call ahead before visiting your doctor Before your medical appointment, call the healthcare provider and tell them that you have, or are being evaluated for, COVID-19 infection. This will help the healthcare provider's office take steps to keep other people from getting infected. Ask your  healthcare provider to call the local or state health department.  Monitor your symptoms Seek prompt medical attention if your illness is worsening (e.g., difficulty breathing). Before going to your medical appointment, call the healthcare provider and tell them that you have, or are being evaluated for, COVID-19 infection. Ask your healthcare provider to call the local or state health department.  Wear a facemask You should wear a facemask that covers your nose and mouth when you are  in the same room with other people and when you visit a healthcare provider. People who live with or visit you should also wear a facemask while they are in the same room with you.  Separate yourself from other people in your home As much as possible, you should stay in a different room from other people in your home. Also, you should use a separate bathroom, if available.  Avoid sharing household items You should not share dishes, drinking glasses, cups, eating utensils, towels, bedding, or other items with other people in your home. After using these items, you should wash them thoroughly with soap and water.  Cover your coughs and sneezes Cover your mouth and nose with a tissue when you cough or sneeze, or you can cough or sneeze into your sleeve. Throw used tissues in a lined trash can, and immediately wash your hands with soap and water for at least 20 seconds or use an alcohol-based hand rub.  Wash your [facility] your hands often and thoroughly with soap and water for at least 20 seconds. You can use an alcohol-based hand sanitizer if soap and water are not available and if your hands are not visibly dirty. Avoid touching your eyes, nose, and mouth with unwashed hands.   Prevention Steps for Caregivers and Household Members of Individuals Confirmed to have, or Being Evaluated for, COVID-19 Infection Being Cared for in the Home  If you live with, or provide care at home for, a person confirmed to  have, or being evaluated for, COVID-19 infection please follow these guidelines to prevent infection:  Follow healthcare provider's instructions Make sure that you understand and can help the patient follow any healthcare provider instructions for all care.  Provide for the patient's basic needs You should help the patient with basic needs in the home and provide support for getting groceries, prescriptions, and other personal needs.  Monitor the patient's symptoms If they are getting sicker, call his or her medical provider and tell them that the patient has, or is being evaluated for, COVID-19 infection. This will help the healthcare provider's office take steps to keep other people from getting infected. Ask the healthcare provider to call the local or state health department.  Limit the number of people who have contact with the patient  If possible, have only one caregiver for the patient.  Other household members should stay in another home or place of residence. If this is not possible, they should stay  in another room, or be separated from the patient as much as possible. Use a separate bathroom, if available.  Restrict visitors who do not have an essential need to be in the home.  Keep older adults, very young children, and other sick people away from the patient Keep older adults, very young children, and those who have compromised immune systems or chronic health conditions away from the patient. This includes people with chronic heart, lung, or kidney conditions, diabetes, and cancer.  Ensure good ventilation Make sure that shared spaces in the home have good air flow, such as from an air conditioner or an opened window, weather permitting.  Wash your hands often  Wash your hands often and thoroughly with soap and water for at least 20 seconds. You can use an alcohol based hand sanitizer if soap and water are not available and if your hands are not visibly dirty.   Avoid touching your eyes, nose, and mouth with unwashed hands.  Use disposable paper towels to dry  your hands. If not available, use dedicated cloth towels and replace them when they become wet.  Wear a facemask and gloves  Wear a disposable facemask at all times in the room and gloves when you touch or have contact with the patient's blood, body fluids, and/or secretions or excretions, such as sweat, saliva, sputum, nasal mucus, vomit, urine, or feces.  Ensure the mask fits over your nose and mouth tightly, and do not touch it during use.  Throw out disposable facemasks and gloves after using them. Do not reuse.  Wash your hands immediately after removing your facemask and gloves.  If your personal clothing becomes contaminated, carefully remove clothing and launder. Wash your hands after handling contaminated clothing.  Place all used disposable facemasks, gloves, and other waste in a lined container before disposing them with other household waste.  Remove gloves and wash your hands immediately after handling these items.  Do not share dishes, glasses, or other household items with the patient  Avoid sharing household items. You should not share dishes, drinking glasses, cups, eating utensils, towels, bedding, or other items with a patient who is confirmed to have, or being evaluated for, COVID-19 infection.  After the person uses these items, you should wash them thoroughly with soap and water.  Wash laundry thoroughly  Immediately remove and wash clothes or bedding that have blood, body fluids, and/or secretions or excretions, such as sweat, saliva, sputum, nasal mucus, vomit, urine, or feces, on them.  Wear gloves when handling laundry from the patient.  Read and follow directions on labels of laundry or clothing items and detergent. In general, wash and dry with the warmest temperatures recommended on the label.  Clean all areas the individual has used often  Clean all  touchable surfaces, such as counters, tabletops, doorknobs, bathroom fixtures, toilets, phones, keyboards, tablets, and bedside tables, every day. Also, clean any surfaces that may have blood, body fluids, and/or secretions or excretions on them.  Wear gloves when cleaning surfaces the patient has come in contact with.  Use a diluted bleach solution (e.g., dilute bleach with 1 part bleach and 10 parts water) or a household disinfectant with a label that says EPA-registered for coronaviruses. To make a bleach solution at home, add 1 tablespoon of bleach to 1 quart (4 cups) of water. For a larger supply, add  cup of bleach to 1 gallon (16 cups) of water.  Read labels of cleaning products and follow recommendations provided on product labels. Labels contain instructions for safe and effective use of the cleaning product including precautions you should take when applying the product, such as wearing gloves or eye protection and making sure you have good ventilation during use of the product.  Remove gloves and wash hands immediately after cleaning.  Monitor yourself for signs and symptoms of illness Caregivers and household members are considered close contacts, should monitor their health, and will be asked to limit movement outside of the home to the extent possible. Follow the monitoring steps for close contacts listed on the symptom monitoring form.   ? If you have additional questions, contact your local health department or call the epidemiologist on call at [phone] (available 24/7). ? This guidance is subject to change. For the most up-to-date guidance from [facility], please refer to their website: YouBlogs.pl  This patient was seen by Leretha Pol FNP Collaboration with Dr Lavera Guise as a part of collaborative care agreement  Orders Placed This Encounter  Procedures  . Novel Coronavirus,  NAA ([facility])    Meds ordered  this encounter  Medications  . azithromycin (ZITHROMAX) 250 MG tablet    Sig: z-pack - take as directed for 5 days for upper respiratory infection.    Dispense:  6 tablet    Refill:  0    Order Specific Question:   Supervising Provider    Answer:   Lavera Guise [[id number]]  . ondansetron (ZOFRAN-ODT) 8 MG disintegrating tablet    Sig: Take 1 tablet (8 mg total) by mouth every 8 (eight) hours as needed for nausea or vomiting.    Dispense:  30 tablet    Refill:  0    Order Specific Question:   Supervising Provider    Answer:   Lavera Guise [[id number]]    Time spent: 6 Minutes    Dr Lavera Guise Internal medicine

## 2018-09-05 LAB — NOVEL CORONAVIRUS, NAA: SARS-CoV-2, NAA: NOT DETECTED

## 2018-09-15 NOTE — Progress Notes (Signed)
Negative COVID-19

## 2018-10-17 ENCOUNTER — Other Ambulatory Visit: Payer: Self-pay | Admitting: Adult Health

## 2018-10-17 DIAGNOSIS — I10 Essential (primary) hypertension: Secondary | ICD-10-CM

## 2018-10-18 ENCOUNTER — Telehealth: Payer: Self-pay

## 2018-10-18 NOTE — Telephone Encounter (Signed)
Refilled for one month, pt needs to be seen

## 2018-10-18 NOTE — Telephone Encounter (Signed)
Tried calling pt to schedule a pcp follow up, unable to get a good connection on phone, when he calls back he needs a general pcp follow up. Beth

## 2018-10-21 ENCOUNTER — Other Ambulatory Visit: Payer: Self-pay

## 2018-10-21 DIAGNOSIS — F411 Generalized anxiety disorder: Secondary | ICD-10-CM

## 2018-10-21 MED ORDER — CITALOPRAM HYDROBROMIDE 40 MG PO TABS: ORAL_TABLET | ORAL | 4 refills | Status: DC

## 2018-10-25 ENCOUNTER — Other Ambulatory Visit: Payer: Self-pay | Admitting: Internal Medicine

## 2018-10-25 DIAGNOSIS — I10 Essential (primary) hypertension: Secondary | ICD-10-CM

## 2018-11-15 ENCOUNTER — Other Ambulatory Visit: Payer: Self-pay

## 2018-11-15 ENCOUNTER — Ambulatory Visit: Payer: 59 | Admitting: Nurse Practitioner

## 2018-11-15 ENCOUNTER — Encounter: Payer: Self-pay | Admitting: Nurse Practitioner

## 2018-11-15 ENCOUNTER — Other Ambulatory Visit: Payer: Self-pay | Admitting: Nurse Practitioner

## 2018-11-15 VITALS — BP 145/89 | HR 80 | Temp 97.7°F | Resp 16 | Ht 75.0 in | Wt 335.0 lb

## 2018-11-15 DIAGNOSIS — Z794 Long term (current) use of insulin: Secondary | ICD-10-CM

## 2018-11-15 DIAGNOSIS — R600 Localized edema: Secondary | ICD-10-CM

## 2018-11-15 DIAGNOSIS — E1159 Type 2 diabetes mellitus with other circulatory complications: Secondary | ICD-10-CM

## 2018-11-15 DIAGNOSIS — I10 Essential (primary) hypertension: Secondary | ICD-10-CM

## 2018-11-15 DIAGNOSIS — E1165 Type 2 diabetes mellitus with hyperglycemia: Secondary | ICD-10-CM | POA: Diagnosis not present

## 2018-11-15 LAB — POCT GLYCOSYLATED HEMOGLOBIN (HGB A1C): Hemoglobin A1C: 8.5 % — AB (ref 4.0–5.6)

## 2018-11-15 MED ORDER — FUROSEMIDE 20 MG PO TABS: 20.0000 mg | ORAL_TABLET | Freq: Every day | ORAL | 3 refills | Status: DC

## 2018-11-15 MED ORDER — TRULICITY 1.5 MG/0.5ML ~~LOC~~ SOAJ: 1.5000 mg | SUBCUTANEOUS | 3 refills | Status: DC

## 2018-11-15 MED ORDER — LISINOPRIL-HYDROCHLOROTHIAZIDE 20-25 MG PO TABS: 1.0000 | ORAL_TABLET | Freq: Two times a day (BID) | ORAL | 3 refills | Status: DC

## 2018-11-15 MED ORDER — LEVEMIR FLEXTOUCH 100 UNIT/ML ~~LOC~~ SOPN: PEN_INJECTOR | SUBCUTANEOUS | 11 refills | Status: DC

## 2018-11-15 NOTE — Progress Notes (Signed)
[facility] [place], [place] [address]  Internal MEDICINE  Office Visit Note  Patient Name: Gerald [name]  [id number]  [id number]  Date of Service: 11/28/2018  Chief Complaint  Patient presents with  . Diabetes  . Hypertension  . Hyperlipidemia  . Quality Metric Gaps    pna vacc and diabetic eye exam  . Fatigue    pt states he is exahusted and low energy  . Knee Pain    pt is having right knee pain, was swollen the other day, leg seems to also be swelling    The patient is here for routine follow up visit. He is doing a little better with blood sugars. His HgbA1c is 8.5 today, down from 9.4 at his most recent visit. He states that he is very fatigued most of the time. He has been having a trouble with pain and swelling in the knees and lower legs. This is intermittent. States that when one gets some better, the other one seems to get a little bit worse.       Current Medication: Outpatient Encounter Medications as of 11/15/2018  Medication Sig  . acetaminophen (TYLENOL) 325 MG tablet Take 325-650 mg by mouth every 6 (six) hours as needed for mild pain or headache.  . albuterol (PROVENTIL HFA;VENTOLIN HFA) 108 (90 Base) MCG/ACT inhaler Inhale 2 puffs into the lungs every 6 (six) hours as needed for wheezing or shortness of breath.  Marland Kitchen aspirin 81 MG EC tablet Take 1 tablet (81 mg total) by mouth daily.  Marland Kitchen azithromycin (ZITHROMAX) 250 MG tablet z-pack - take as directed for 5 days for upper respiratory infection.  . citalopram (CELEXA) 40 MG tablet TAKE 1 TABLET BY MOUTH EVERY DAY FOR GAD  . Dulaglutide (TRULICITY) 1.5 0000000 SOPN Inject 1.5 mg into the skin every Wednesday.  . Insulin Detemir (LEVEMIR FLEXTOUCH) 100 UNIT/ML Pen Inject 50units [place] daily.  Marland Kitchen lisinopril-hydrochlorothiazide (ZESTORETIC) 20-25 MG tablet Take 1 tablet by mouth 2 (two) times daily.  . nitroGLYCERIN (NITROSTAT) 0.4 MG SL tablet Place 1 tablet (0.4 mg total) under the tongue  every 5 (five) minutes x 3 doses as needed for chest pain.  Marland Kitchen ondansetron (ZOFRAN-ODT) 8 MG disintegrating tablet Take 1 tablet (8 mg total) by mouth every 8 (eight) hours as needed for nausea or vomiting.  . pantoprazole (PROTONIX) 40 MG tablet Take 1 tablet (40 mg total) by mouth 2 (two) times daily before a meal.  . [DISCONTINUED] Dulaglutide (TRULICITY) 1.5 0000000 SOPN Inject 1.5 mg into the skin every Wednesday.  . [DISCONTINUED] Insulin Detemir (LEVEMIR FLEXTOUCH) 100 UNIT/ML Pen Inject 50units [place] daily. (Patient taking differently: Inject 50 Units into the skin every evening. Inject 50units [place] daily.)  . [DISCONTINUED] lisinopril-hydrochlorothiazide (ZESTORETIC) 20-25 MG tablet TAKE 1 TABLET BY MOUTH TWICE A DAY  . furosemide (LASIX) 20 MG tablet Take 1 tablet (20 mg total) by mouth daily.   No facility-administered encounter medications on file as of 11/15/2018.     Surgical History: Past Surgical History:  Procedure Laterality Date  . CARDIAC CATHETERIZATION N/A 09/2011   [facility]; EF 50% with 30% mid LAD stenosis and no obstructive disease.  Marland Kitchen CARDIAC CATHETERIZATION  09/2010   [facility]; Mid LAD 40% stenosis; Mid Circumflex:Normal; Mid RCA; Normal  . CARDIAC CATHETERIZATION    . COLONOSCOPY    . ESOPHAGOGASTRODUODENOSCOPY (EGD) WITH PROPOFOL N/A 06/10/2017   Procedure: ESOPHAGOGASTRODUODENOSCOPY (EGD) WITH PROPOFOL;  Surgeon: Jonathon Bellows, MD;  Location: [facility] ENDOSCOPY;  Service: Gastroenterology;  Laterality: N/A;  . KNEE ARTHROSCOPY WITH MEDIAL MENISECTOMY Right 09/16/2012   Procedure: RIGHT KNEE ARTHROSCOPY WITH MEDIAL AND LATERAL MENISECTOMY, CHONDROPLASTY;  Surgeon: Ninetta Lights, MD;  Location: [facility];  Service: Orthopedics;  Laterality: Right;  RIGHT KNEE SCOPE MEDIAL MENISCECTOMY  . LEFT HEART CATH AND CORONARY ANGIOGRAPHY N/A 06/11/2016   Procedure: Left Heart Cath and Coronary Angiography;  Surgeon: Minna Merritts, MD;  Location: [facility];  Service:  Cardiovascular;  Laterality: N/A;    Medical History: Past Medical History:  Diagnosis Date  . Abnormal LFTs   . Carcinoma ([place])   . Diabetes mellitus without complication (HCC)    borderline  . Fundic gland polyps of stomach, benign   . Gastritis   . GERD (gastroesophageal reflux disease)   . Hepatic steatosis   . Hyperlipidemia   . Hypertension   . Nonischemic cardiomyopathy (HCC)    Previous ejection fraction of 35-40% on echo. 50% on cardiac catheterization in August of 2013  . Obesity   . Sleep apnea     Family History: Family History  Problem Relation Age of Onset  . Heart disease Father 31       MI  . Heart attack Father   . Stomach cancer Father   . Hypertension Mother   . Breast cancer Mother     Social History   Socioeconomic History  . Marital status: Married    Spouse name: Not on file  . Number of children: 3  . Years of education: Not on file  . Highest education level: Not on file  Occupational History  . Occupation: Librarian, academic  Social Needs  . Financial resource strain: Not on file  . Food insecurity    Worry: Not on file    Inability: Not on file  . Transportation needs    Medical: Not on file    Non-medical: Not on file  Tobacco Use  . Smoking status: Never Smoker  . Smokeless tobacco: Never Used  Substance and Sexual Activity  . Alcohol use: Not Currently    Alcohol/week: 0.0 standard drinks    Comment: rare  . Drug use: No  . Sexual activity: Not on file  Lifestyle  . Physical activity    Days per week: Not on file    Minutes per session: Not on file  . Stress: Not on file  Relationships  . Social Herbalist on phone: Not on file    Gets together: Not on file    Attends religious service: Not on file    Active member of club or organization: Not on file    Attends meetings of clubs or organizations: Not on file    Relationship status: Not on file  . Intimate partner violence    Fear of current or ex  partner: Not on file    Emotionally abused: Not on file    Physically abused: Not on file    Forced sexual activity: Not on file  Other Topics Concern  . Not on file  Social History Narrative   Married.  24 yo son.   Wife on disability for bipolar disorder.        Review of Systems  Constitutional: Positive for fatigue. Negative for activity change, chills and unexpected weight change.  HENT: Negative for congestion, rhinorrhea, sneezing and sore throat.   Respiratory: Positive for shortness of breath. Negative for cough and chest tightness.  Intermittent and worse with exertion.   Cardiovascular: Positive for leg swelling. Negative for chest pain and palpitations.  Gastrointestinal: Negative for abdominal pain, constipation, diarrhea, nausea and vomiting.  Endocrine: Negative for cold intolerance, heat intolerance, polydipsia and polyuria.       Blood sugars are slightly improved from the last visit.   Musculoskeletal: Positive for arthralgias. Negative for back pain, joint swelling and neck pain.       Bilateral knee pain.   Skin: Negative for rash.  Allergic/Immunologic: Positive for environmental allergies.  Neurological: Positive for headaches. Negative for tremors and numbness.  Hematological: Negative for adenopathy. Does not bruise/bleed easily.  Psychiatric/Behavioral: Negative.  Negative for behavioral problems, sleep disturbance and suicidal ideas. The patient is not nervous/anxious.     Today's Vitals   11/15/18 1505  BP: (!) 145/89  Pulse: 80  Resp: 16  Temp: 97.7 F (36.5 C)  SpO2: 97%  Weight: (!) 335 lb (152 kg)  Height: 6\' 3"  (1.905 m)   Body mass index is 41.87 kg/m.  Physical Exam Vitals signs and nursing note reviewed.  Constitutional:      General: He is not in acute distress.    Appearance: Normal appearance. He is well-developed. He is obese. He is not diaphoretic.  HENT:     Head: Normocephalic and atraumatic.     Mouth/Throat:      Pharynx: No oropharyngeal exudate.  Eyes:     Pupils: Pupils are equal, round, and reactive to light.  Neck:     Musculoskeletal: Normal range of motion and neck supple.     Thyroid: No thyromegaly.     Vascular: No carotid bruit or JVD.     Trachea: No tracheal deviation.  Cardiovascular:     Rate and Rhythm: Normal rate and regular rhythm.     Heart sounds: Normal heart sounds. No murmur. No friction rub. No gallop.      Comments: Bilateral lower legs are mildly swollen.  Pulmonary:     Effort: Pulmonary effort is normal. No respiratory distress.     Breath sounds: Normal breath sounds. No wheezing, rhonchi or rales.  Chest:     Chest wall: No tenderness.  Abdominal:     Palpations: Abdomen is soft.     Tenderness: There is no abdominal tenderness. There is no guarding.  Musculoskeletal: Normal range of motion.     Comments: There is some tenderness with swelling of the right knee. More tender with palpation. Flexion and extension of the knee increase pain. No bony abnormalities or deformities are appreciated at this time.   Lymphadenopathy:     Cervical: No cervical adenopathy.  Skin:    General: Skin is warm and dry.  Neurological:     Mental Status: He is alert and oriented to person, place, and time.     Cranial Nerves: No cranial nerve deficit.  Psychiatric:        Behavior: Behavior normal.        Thought Content: Thought content normal.        Judgment: Judgment normal.    Assessment/Plan: 1. Type 2 diabetes mellitus with other circulatory complication, with long-term current use of insulin (HCC) Improving. Increase levemir to 50units [place] daily. Continue trulicity at 1.5mg  weekly. Reiterated the importance of taking diabetic medication as prescribed to limit risk factors and complications associated with uncontrolled blood sugars.  - Dulaglutide (TRULICITY) 1.5 0000000 SOPN; Inject 1.5 mg into the skin every Wednesday.  Dispense: 4 pen; Refill: 3 -  Insulin Detemir  (LEVEMIR FLEXTOUCH) 100 UNIT/ML Pen; Inject 50units [place] daily.  Dispense: 15 mL; Refill: 11  2. Type 2 diabetes mellitus with hyperglycemia, unspecified whether long term insulin use (HCC) - POCT HgB A1C 8.5 today.   3. Essential hypertension Generally stable. Continue bp medication as prescribed.  - lisinopril-hydrochlorothiazide (ZESTORETIC) 20-25 MG tablet; Take 1 tablet by mouth 2 (two) times daily.  Dispense: 90 tablet; Refill: 3  4. Lower extremity edema Add furosemide 20mg  daily as needed.  - furosemide (LASIX) 20 MG tablet; Take 1 tablet (20 mg total) by mouth daily.  Dispense: 30 tablet; Refill: 3  General Counseling: Malaki verbalizes understanding of the findings of todays visit and agrees with plan of treatment. I have discussed any further diagnostic evaluation that may be needed or ordered today. We also reviewed his medications today. he has been encouraged to call the office with any questions or concerns that should arise related to todays visit.  Diabetes Counseling:  1. Addition of ACE inh/ ARB'S for nephroprotection. Microalbumin is updated  2. Diabetic foot care, prevention of complications. Podiatry consult 3. Exercise and lose weight.  4. Diabetic eye examination, Diabetic eye exam is updated  5. Monitor blood sugar closlely. nutrition counseling.  6. Sign and symptoms of hypoglycemia including shaking sweating,confusion and headaches.  Counseling: Adherence of Medical Therapy: The patient understands that it is the responsibility of the patient to complete all prescribed medications, all recommended testing, including but not limited to, laboratory studies and imaging. The patient further understands the need to keep all scheduled follow-up visits and to inform the office immediately of any changes in their medical condition. The patient understands that the success of treatment in large part depends on the patient's willingness to complete the therapeutic regimen and  to work in partnership with the designated health-care providers.  This patient was seen by Leretha Pol FNP Collaboration with Dr Lavera Guise as a part of collaborative care agreement  Orders Placed This Encounter  Procedures  . POCT HgB A1C    Meds ordered this encounter  Medications  . Dulaglutide (TRULICITY) 1.5 0000000 SOPN    Sig: Inject 1.5 mg into the skin every Wednesday.    Dispense:  4 pen    Refill:  3    Order Specific Question:   Supervising Provider    Answer:   Lavera Guise [id number]  . Insulin Detemir (LEVEMIR FLEXTOUCH) 100 UNIT/ML Pen    Sig: Inject 50units [place] daily.    Dispense:  15 mL    Refill:  11    Order Specific Question:   Supervising Provider    Answer:   Lavera Guise [id number]  . lisinopril-hydrochlorothiazide (ZESTORETIC) 20-25 MG tablet    Sig: Take 1 tablet by mouth 2 (two) times daily.    Dispense:  90 tablet    Refill:  3    Order Specific Question:   Supervising Provider    Answer:   Lavera Guise [id number]  . furosemide (LASIX) 20 MG tablet    Sig: Take 1 tablet (20 mg total) by mouth daily.    Dispense:  30 tablet    Refill:  3    Order Specific Question:   Supervising Provider    Answer:   Lavera Guise [id number]    Time spent: 62 Minutes      Dr Lavera Guise Internal medicine

## 2018-11-28 DIAGNOSIS — R600 Localized edema: Secondary | ICD-10-CM | POA: Insufficient documentation

## 2018-12-13 ENCOUNTER — Ambulatory Visit: Payer: 59 | Admitting: Nurse Practitioner

## 2019-01-10 ENCOUNTER — Other Ambulatory Visit: Payer: Self-pay

## 2019-01-10 DIAGNOSIS — R600 Localized edema: Secondary | ICD-10-CM

## 2019-01-10 MED ORDER — FUROSEMIDE 20 MG PO TABS: 20.0000 mg | ORAL_TABLET | Freq: Every day | ORAL | 3 refills | Status: DC

## 2019-03-22 ENCOUNTER — Emergency Department
Admission: EM | Admit: 2019-03-22 | Discharge: 2019-03-22 | Disposition: A | Discharge status: Home / Self Care | Payer: 59 | Attending: Emergency Medicine | Admitting: Emergency Medicine

## 2019-03-22 ENCOUNTER — Emergency Department: Payer: 59

## 2019-03-22 ENCOUNTER — Encounter: Payer: Self-pay | Admitting: Emergency Medicine

## 2019-03-22 ENCOUNTER — Other Ambulatory Visit: Payer: Self-pay

## 2019-03-22 DIAGNOSIS — Z888 Allergy status to other drugs, medicaments and biological substances status: Secondary | ICD-10-CM | POA: Diagnosis not present

## 2019-03-22 DIAGNOSIS — Z794 Long term (current) use of insulin: Secondary | ICD-10-CM | POA: Insufficient documentation

## 2019-03-22 DIAGNOSIS — R509 Fever, unspecified: Secondary | ICD-10-CM | POA: Diagnosis present

## 2019-03-22 DIAGNOSIS — E119 Type 2 diabetes mellitus without complications: Secondary | ICD-10-CM | POA: Diagnosis not present

## 2019-03-22 DIAGNOSIS — Z7982 Long term (current) use of aspirin: Secondary | ICD-10-CM | POA: Diagnosis not present

## 2019-03-22 DIAGNOSIS — E782 Mixed hyperlipidemia: Secondary | ICD-10-CM | POA: Diagnosis not present

## 2019-03-22 DIAGNOSIS — I10 Essential (primary) hypertension: Secondary | ICD-10-CM | POA: Insufficient documentation

## 2019-03-22 DIAGNOSIS — U071 COVID-19: Secondary | ICD-10-CM | POA: Insufficient documentation

## 2019-03-22 DIAGNOSIS — J069 Acute upper respiratory infection, unspecified: Secondary | ICD-10-CM

## 2019-03-22 DIAGNOSIS — Z79899 Other long term (current) drug therapy: Secondary | ICD-10-CM | POA: Diagnosis not present

## 2019-03-22 LAB — SARS CORONAVIRUS 2 (TAT 6-24 HRS): SARS Coronavirus 2: POSITIVE — AB

## 2019-03-22 MED ORDER — FEXOFENADINE-PSEUDOEPHED ER 60-120 MG PO TB12: 1.0000 | ORAL_TABLET | Freq: Two times a day (BID) | ORAL | 0 refills | Status: DC

## 2019-03-22 MED ORDER — IBUPROFEN 600 MG PO TABS: 600.0000 mg | ORAL_TABLET | Freq: Three times a day (TID) | ORAL | 0 refills | Status: DC | PRN

## 2019-03-22 MED ORDER — MUCINEX 600 MG PO TB12: 600.0000 mg | ORAL_TABLET | Freq: Two times a day (BID) | ORAL | 0 refills | Status: DC

## 2019-03-22 NOTE — ED Provider Notes (Signed)
[facility] Emergency Department Provider Note   ____________________________________________   First MD Initiated Contact with Patient 03/22/19 0715     (approximate)  I have reviewed the triage vital signs and the nursing notes.   HISTORY  Chief Complaint Fever and Headache    HPI Gerald [name] is a 54 y.o. male patient presents with 4 days of intermittent fever, frontal headache, and productive cough.  Patient denies recent travel no contact with COVID-19.  Patient states fever is relieved with Tylenol but returns back pressure in 6 hours after taking medication.  Patient the productive cough is greenish in color.  Denies loss of taste or smell.  Denies nausea, vomiting, diarrhea.         Past Medical History:  Diagnosis Date  . Abnormal LFTs   . Carcinoma ([place])   . Diabetes mellitus without complication (HCC)    borderline  . Fundic gland polyps of stomach, benign   . Gastritis   . GERD (gastroesophageal reflux disease)   . Hepatic steatosis   . Hyperlipidemia   . Hypertension   . Nonischemic cardiomyopathy (HCC)    Previous ejection fraction of 35-40% on echo. 50% on cardiac catheterization in August of 2013  . Obesity   . Sleep apnea     Patient Active Problem List   Diagnosis Date Noted  . Lower extremity edema 11/28/2018  . Acute upper respiratory infection 09/03/2018  . Nausea and vomiting 09/03/2018  . Exposure to COVID-19 virus 09/03/2018  . Chest pain 01/08/2018  . Gastroesophageal reflux disease without esophagitis 08/23/2017  . Urinary tract infection without hematuria 06/14/2017  . Acute non-recurrent sinusitis 06/14/2017  . Generalized anxiety disorder 02/02/2017  . Other fatigue 02/02/2017  . Abnormal results of liver function studies 02/02/2017  . Myalgia 02/02/2017  . Edema, unspecified 02/02/2017  . Circadian rhythm sleep disorder, shift work type 02/02/2017  . Acne vulgaris 02/02/2017  . Occlusion and  stenosis of bilateral carotid arteries 02/02/2017  . Abnormal weight gain 02/02/2017  . Pain in unspecified knee 02/02/2017  . Neoplasm of uncertain behavior of skin 02/02/2017  . Hypersomnia, unspecified 02/02/2017  . Shortness of breath 02/02/2017  . Morbid obesity ([place]) 07/07/2016  . Atypical chest pain 06/08/2016  . Other chest pain 05/22/2014  . Mass of jaw 05/22/2014  . Right flank pain 11/23/2013  . Dysuria 11/23/2013  . Encounter for wellness examination 11/08/2013  . Retaining fluid 11/08/2013  . Urinary incontinence 03/14/2013  . Depression 01/13/2013  . Type 2 diabetes mellitus ([place]) 09/14/2012  . Nonischemic cardiomyopathy ([place])   . Mixed hyperlipidemia 05/28/2012  . Chronic pain syndrome 03/30/2012  . HTN (hypertension) 03/30/2012    Past Surgical History:  Procedure Laterality Date  . CARDIAC CATHETERIZATION N/A 09/2011   [facility]; EF 50% with 30% mid LAD stenosis and no obstructive disease.  Marland Kitchen CARDIAC CATHETERIZATION  09/2010   [facility]; Mid LAD 40% stenosis; Mid Circumflex:Normal; Mid RCA; Normal  . CARDIAC CATHETERIZATION    . COLONOSCOPY    . ESOPHAGOGASTRODUODENOSCOPY (EGD) WITH PROPOFOL N/A 06/10/2017   Procedure: ESOPHAGOGASTRODUODENOSCOPY (EGD) WITH PROPOFOL;  Surgeon: Jonathon Bellows, MD;  Location: [facility] ENDOSCOPY;  Service: Gastroenterology;  Laterality: N/A;  . KNEE ARTHROSCOPY WITH MEDIAL MENISECTOMY Right 09/16/2012   Procedure: RIGHT KNEE ARTHROSCOPY WITH MEDIAL AND LATERAL MENISECTOMY, CHONDROPLASTY;  Surgeon: Ninetta Lights, MD;  Location: [place] [facility];  Service: Orthopedics;  Laterality: Right;  RIGHT KNEE SCOPE MEDIAL MENISCECTOMY  . LEFT HEART CATH AND CORONARY  ANGIOGRAPHY N/A 06/11/2016   Procedure: Left Heart Cath and Coronary Angiography;  Surgeon: Minna Merritts, MD;  Location: [facility];  Service: Cardiovascular;  Laterality: N/A;    Prior to Admission medications   Medication Sig Start Date End Date Taking? Authorizing Provider    acetaminophen (TYLENOL) 325 MG tablet Take 325-650 mg by mouth every 6 (six) hours as needed for mild pain or headache.    [provider]  albuterol (PROVENTIL HFA;VENTOLIN HFA) 108 (90 Base) MCG/ACT inhaler Inhale 2 puffs into the lungs every 6 (six) hours as needed for wheezing or shortness of breath. 01/12/18   Kendell Bane, NP  aspirin 81 MG EC tablet Take 1 tablet (81 mg total) by mouth daily. 01/10/18   Fritzi Mandes, MD  azithromycin (ZITHROMAX) 250 MG tablet z-pack - take as directed for 5 days for upper respiratory infection. 09/03/18   Ronnell Freshwater, NP  citalopram (CELEXA) 40 MG tablet TAKE 1 TABLET BY MOUTH EVERY DAY FOR GAD 10/21/18   Ronnell Freshwater, NP  Dulaglutide (TRULICITY) 1.5 0000000 SOPN Inject 1.5 mg into the skin every Wednesday. 11/17/18   Ronnell Freshwater, NP  fexofenadine-pseudoephedrine (ALLEGRA-D) 60-120 MG 12 hr tablet Take 1 tablet by mouth 2 (two) times daily. 03/22/19   Sable Feil, PA-C  furosemide (LASIX) 20 MG tablet Take 1 tablet (20 mg total) by mouth daily. 01/10/19   Ronnell Freshwater, NP  guaiFENesin (MUCINEX) 600 MG 12 hr tablet Take 1 tablet (600 mg total) by mouth 2 (two) times daily. 03/22/19 03/21/20  Sable Feil, PA-C  ibuprofen (ADVIL) 600 MG tablet Take 1 tablet (600 mg total) by mouth every 8 (eight) hours as needed. 03/22/19   Sable Feil, PA-C  Insulin Detemir (LEVEMIR FLEXTOUCH) 100 UNIT/ML Pen Inject 50units [place] daily. 11/15/18   Ronnell Freshwater, NP  lisinopril-hydrochlorothiazide (ZESTORETIC) 20-25 MG tablet Take 1 tablet by mouth 2 (two) times daily. 11/15/18   Ronnell Freshwater, NP  nitroGLYCERIN (NITROSTAT) 0.4 MG SL tablet Place 1 tablet (0.4 mg total) under the tongue every 5 (five) minutes x 3 doses as needed for chest pain. 01/09/18   Fritzi Mandes, MD  pantoprazole (PROTONIX) 40 MG tablet Take 1 tablet (40 mg total) by mouth 2 (two) times daily before a meal. 01/09/18   Fritzi Mandes, MD    Allergies Statins  Family  History  Problem Relation Age of Onset  . Heart disease Father 37       MI  . Heart attack Father   . Stomach cancer Father   . Hypertension Mother   . Breast cancer Mother     Social History Social History   Tobacco Use  . Smoking status: Never Smoker  . Smokeless tobacco: Never Used  Substance Use Topics  . Alcohol use: Not Currently    Alcohol/week: 0.0 standard drinks    Comment: rare  . Drug use: No    Review of Systems Constitutional: No fever/chills Eyes: No visual changes. ENT: No sore throat. Cardiovascular: Denies chest pain. Respiratory: Denies shortness of breath. Gastrointestinal: No abdominal pain.  No nausea, no vomiting.  No diarrhea.  No constipation. Genitourinary: Negative for dysuria. Musculoskeletal: Negative for back pain. Skin: Negative for rash. Neurological: Negative for headaches, focal weakness or numbness. Psychiatric: Depression  Endocrine:  Diabetes, hyperlipidemia, and hypertension. Allergic/Immunilogical: Statins  ____________________________________________   PHYSICAL EXAM:  VITAL SIGNS: ED Triage Vitals  Enc Vitals Group     BP 03/22/19  0452 (!) 143/95     Pulse Rate 03/22/19 0452 94     Resp 03/22/19 0452 18     Temp 03/22/19 0452 99.5 F (37.5 C)     Temp Source 03/22/19 0452 Oral     SpO2 03/22/19 0452 97 %     Weight 03/22/19 0446 (!) 330 lb (149.7 kg)     Height 03/22/19 0446 6\' 3"  (1.905 m)     Head Circumference --      Peak Flow --      Pain Score 03/22/19 0446 7     Pain Loc --      Pain Edu? --      Excl. in [place]? --    Constitutional: Alert and oriented. Well appearing and in no acute distress.  Morbid obesity. Eyes: Conjunctivae are normal. PERRL. EOMI. Head: Atraumatic. Nose: Bilateral maxillary guarding with edematous nasal turbinates.. Mouth/Throat: Mucous membranes are moist.  Oropharynx non-erythematous.  Postnasal drainage. Neck: No stridor.  Hematological/Lymphatic/Immunilogical: No cervical  lymphadenopathy. Cardiovascular: Normal rate, regular rhythm. Grossly normal heart sounds.  Good peripheral circulation. Respiratory: Normal respiratory effort.  No retractions. Lungs CTAB. Skin:  Skin is warm, dry and intact. No rash noted. Psychiatric: Mood and affect are normal. Speech and behavior are normal.  ____________________________________________   LABS (all labs ordered are listed, but only abnormal results are displayed)  Labs Reviewed  SARS CORONAVIRUS 2 (TAT 6-24 HRS)   ____________________________________________  EKG   ____________________________________________  RADIOLOGY  ED MD interpretation:    Official radiology report(s): DG Chest 2 View  Result Date: 03/22/2019 CLINICAL DATA:  Cough. Fever. EXAM: CHEST - 2 VIEW COMPARISON:  Radiograph 08/30/2018 FINDINGS: The cardiomediastinal contours are normal. Mild chronic central bronchial thickening. Pulmonary vasculature is normal. No consolidation, pleural effusion, or pneumothorax. No acute osseous abnormalities are seen. IMPRESSION: No acute chest findings. Electronically Signed   By: Keith Rake M.D.   On: 03/22/2019 05:39    ____________________________________________   PROCEDURES  Procedure(s) performed (including Critical Care):  Procedures   ____________________________________________   INITIAL IMPRESSION / ASSESSMENT AND PLAN / ED COURSE  As part of my medical decision making, I reviewed the following data within the [place]      Patient presents with 4 days of headache, fever, and productive cough.  Discussed negative chest x-ray findings with patient.  Patient physical exam consistent with viral respiratory infection with cough.  Patient given discharge care instruction advised take medication as directed.  Patient advised self quarantine pending results of COVID-19 test.  Patient may follow-up results in the MyChart app.  Gerald [name] was evaluated in  Emergency Department on 03/22/2019 for the symptoms described in the history of present illness. He was evaluated in the context of the global COVID-19 pandemic, which necessitated consideration that the patient might be at risk for infection with the SARS-CoV-2 virus that causes COVID-19. Institutional protocols and algorithms that pertain to the evaluation of patients at risk for COVID-19 are in a state of rapid change based on information released by regulatory bodies including the CDC and federal and state organizations. These policies and algorithms were followed during the patient's care in the ED.       ____________________________________________   FINAL CLINICAL IMPRESSION(S) / ED DIAGNOSES  Final diagnoses:  Upper respiratory tract infection, unspecified type     ED Discharge Orders         Ordered    fexofenadine-pseudoephedrine (ALLEGRA-D) 60-120 MG 12 hr tablet  2  times daily     03/22/19 0722    guaiFENesin (MUCINEX) 600 MG 12 hr tablet  2 times daily     03/22/19 0722    ibuprofen (ADVIL) 600 MG tablet  Every 8 hours PRN     03/22/19 [id number]           Note:  This document was prepared using Dragon voice recognition software and may include unintentional dictation errors.    Sable Feil, PA-C 03/22/19 0727    Lilia Pro., MD 03/22/19 [id number]

## 2019-03-22 NOTE — Discharge Instructions (Signed)
Advised self quarantine pending results of COVID-19 test.  Take medication as directed.  If test is positive must quarantine additional 10 days.

## 2019-03-22 NOTE — ED Notes (Signed)
See triage note  Presents with a 4 day hx of fever  States temp as been 102 at home  Min relief with tylenol or IBU  Also having slight chest discomfort,cough and h/a  Low grade temp noted on arrival

## 2019-03-22 NOTE — ED Triage Notes (Signed)
Patient ambulatory to triage with steady gait, without difficulty or distress noted, mask in place; pt reports fever & HA with prod cough green sputum x 4 days

## 2019-03-22 NOTE — ED Notes (Signed)
Pt was notified of positive Covid results, pt verbalized understanding of home care instructions.

## 2019-03-23 ENCOUNTER — Telehealth: Payer: Self-pay | Admitting: Infectious Diseases

## 2019-03-23 ENCOUNTER — Ambulatory Visit (HOSPITAL_COMMUNITY)
Admission: RE | Admit: 2019-03-23 | Discharge: 2019-03-23 | Disposition: A | Discharge status: Home / Self Care | Payer: 59 | Source: Ambulatory Visit | Attending: Pulmonary Disease | Admitting: Pulmonary Disease

## 2019-03-23 ENCOUNTER — Other Ambulatory Visit: Payer: Self-pay | Admitting: Infectious Diseases

## 2019-03-23 DIAGNOSIS — Z794 Long term (current) use of insulin: Secondary | ICD-10-CM | POA: Diagnosis present

## 2019-03-23 DIAGNOSIS — E1165 Type 2 diabetes mellitus with hyperglycemia: Secondary | ICD-10-CM

## 2019-03-23 DIAGNOSIS — U071 COVID-19: Secondary | ICD-10-CM | POA: Diagnosis present

## 2019-03-23 MED ORDER — SODIUM CHLORIDE 0.9 % IV SOLN
INTRAVENOUS | Status: DC | PRN
  Administered 2019-03-23: 250 mL via INTRAVENOUS

## 2019-03-23 MED ORDER — DIPHENHYDRAMINE HCL 50 MG/ML IJ SOLN: 50.0000 mg | Freq: Once | INTRAMUSCULAR | Status: DC | PRN

## 2019-03-23 MED ORDER — SODIUM CHLORIDE 0.9 % IV SOLN
700.0000 mg | Freq: Once | INTRAVENOUS | Status: AC
  Administered 2019-03-23: 700 mg via INTRAVENOUS
  Filled 2019-03-23: qty 20

## 2019-03-23 MED ORDER — ALBUTEROL SULFATE HFA 108 (90 BASE) MCG/ACT IN AERS: 2.0000 | INHALATION_SPRAY | Freq: Once | RESPIRATORY_TRACT | Status: DC | PRN

## 2019-03-23 MED ORDER — FAMOTIDINE IN NACL 20-0.9 MG/50ML-% IV SOLN: 20.0000 mg | Freq: Once | INTRAVENOUS | Status: DC | PRN

## 2019-03-23 MED ORDER — EPINEPHRINE 0.3 MG/0.3ML IJ SOAJ: 0.3000 mg | Freq: Once | INTRAMUSCULAR | Status: DC | PRN

## 2019-03-23 MED ORDER — METHYLPREDNISOLONE SODIUM SUCC 125 MG IJ SOLR: 125.0000 mg | Freq: Once | INTRAMUSCULAR | Status: DC | PRN

## 2019-03-23 NOTE — Discharge Instructions (Signed)

## 2019-03-23 NOTE — Progress Notes (Signed)
  I connected by phone with Gerald [name] on 03/23/2019 at 9:52 AM to discuss the potential use of an new treatment for mild to moderate COVID-19 viral infection in non-hospitalized patients.  This patient is a 54 y.o. male that meets the FDA criteria for Emergency Use Authorization of bamlanivimab or casirivimab\imdevimab.  Has a (+) direct SARS-CoV-2 viral test result  Has mild or moderate COVID-19   Is ? 53 years of age and weighs ? 40 kg  Is NOT hospitalized due to COVID-19  Is NOT requiring oxygen therapy or requiring an increase in baseline oxygen flow rate due to COVID-19  Is within 10 days of symptom onset  Has at least one of the high risk factor(s) for progression to severe COVID-19 and/or hospitalization as defined in EUA.  Specific high risk criteria : Diabetes   I have spoken and communicated the following to the patient or parent/caregiver:  1. FDA has authorized the emergency use of bamlanivimab and casirivimab\imdevimab for the treatment of mild to moderate COVID-19 in adults and pediatric patients with positive results of direct SARS-CoV-2 viral testing who are 73 years of age and older weighing at least 40 kg, and who are at high risk for progressing to severe COVID-19 and/or hospitalization.  2. The significant known and potential risks and benefits of bamlanivimab and casirivimab\imdevimab, and the extent to which such potential risks and benefits are unknown.  3. Information on available alternative treatments and the risks and benefits of those alternatives, including clinical trials.  4. Patients treated with bamlanivimab and casirivimab\imdevimab should continue to self-isolate and use infection control measures (e.g., wear mask, isolate, social distance, avoid sharing personal items, clean and disinfect "high touch" surfaces, and frequent handwashing) according to CDC guidelines.   5. The patient or parent/caregiver has the option to accept or refuse  bamlanivimab or casirivimab\imdevimab .  After reviewing this information with the patient, The patient agreed to proceed with receiving the bamlanimivab infusion and will be provided a copy of the Fact sheet prior to receiving the infusion.Gerald [name] 03/23/2019 9:52 AM

## 2019-03-23 NOTE — Telephone Encounter (Signed)
Called to discuss with patient about Covid symptoms and the use of bamlanivimab, a monoclonal antibody infusion for those with mild to moderate Covid symptoms and at a high risk of hospitalization.  Pt is qualified for this infusion at the [facility] infusion center due to BMI>35   Sx day 5 today. Presented to ER last night with intermittent fevers, headaches and nonproductive cough. CXR findings reveal no pneumonia. COVID+.   His BMI last calculated @ 41.25 at this encounter   He has been running a fever of 106 F. Blood sugars 300s. Increased urination. Doing his best to keep fluids down. Mostly water but some orange juice.  Precautions discussed re: dehydration and possible need for IVF in ER or UC.   He is scheduled for Bamlanivimab today @ 2:30 pm.

## 2019-03-23 NOTE — Progress Notes (Addendum)
  Diagnosis: COVID-19  Physician: Dr. Wright  Procedure: Covid Infusion Clinic Med: bamlanivimab infusion - Provided patient with bamlanimivab fact sheet for patients, parents and caregivers prior to infusion.  Complications: No immediate complications noted.  Discharge: Discharged home   Gerald [name] 03/23/2019   

## 2019-05-09 ENCOUNTER — Encounter: Payer: Self-pay | Admitting: Adult Health

## 2019-05-09 ENCOUNTER — Ambulatory Visit: Payer: 59 | Admitting: Adult Health

## 2019-05-09 ENCOUNTER — Other Ambulatory Visit: Payer: Self-pay

## 2019-05-09 ENCOUNTER — Telehealth: Payer: Self-pay

## 2019-05-09 VITALS — BP 130/80 | HR 82 | Temp 97.6°F | Resp 16 | Ht 75.0 in | Wt 326.0 lb

## 2019-05-09 DIAGNOSIS — N481 Balanitis: Secondary | ICD-10-CM

## 2019-05-09 DIAGNOSIS — R21 Rash and other nonspecific skin eruption: Secondary | ICD-10-CM | POA: Diagnosis not present

## 2019-05-09 DIAGNOSIS — R300 Dysuria: Secondary | ICD-10-CM | POA: Diagnosis not present

## 2019-05-09 DIAGNOSIS — I10 Essential (primary) hypertension: Secondary | ICD-10-CM | POA: Diagnosis not present

## 2019-05-09 DIAGNOSIS — R32 Unspecified urinary incontinence: Secondary | ICD-10-CM | POA: Diagnosis not present

## 2019-05-09 MED ORDER — FLUCONAZOLE 150 MG PO TABS: 150.0000 mg | ORAL_TABLET | ORAL | 0 refills | Status: DC

## 2019-05-09 MED ORDER — NYSTATIN-TRIAMCINOLONE 100000-0.1 UNIT/GM-% EX OINT: 1.0000 "application " | TOPICAL_OINTMENT | Freq: Two times a day (BID) | CUTANEOUS | 0 refills | Status: DC

## 2019-05-09 NOTE — Telephone Encounter (Signed)
Confirmed appointment on 05/09/2019 and screened for covid. klh 

## 2019-05-09 NOTE — Progress Notes (Signed)
[facility] [place], [place] [address]  Internal MEDICINE  Office Visit Note  Patient Name: Gerald [name]  [id number]  [id number]  Date of Service: 05/09/2019  Chief Complaint  Patient presents with  . Acute Visit    trouble holding urine, possible infection near gentials     HPI Pt is here for a sick visit. Pt reports he has been having urinary leaking for a few months.  He reports extreme urinary urgency, to the point he can not hold it.  He also reports for the last two weeks he has noticed some "raw" feeling skin around his penis.  He reports constancy being moist. He reports some pain with urinary, but mostly right at the urethral opening.    Current Medication:  Outpatient Encounter Medications as of 05/09/2019  Medication Sig  . acetaminophen (TYLENOL) 325 MG tablet Take 325-650 mg by mouth every 6 (six) hours as needed for mild pain or headache.  . albuterol (PROVENTIL HFA;VENTOLIN HFA) 108 (90 Base) MCG/ACT inhaler Inhale 2 puffs into the lungs every 6 (six) hours as needed for wheezing or shortness of breath.  Marland Kitchen aspirin 81 MG EC tablet Take 1 tablet (81 mg total) by mouth daily.  Marland Kitchen azithromycin (ZITHROMAX) 250 MG tablet z-pack - take as directed for 5 days for upper respiratory infection.  . citalopram (CELEXA) 40 MG tablet TAKE 1 TABLET BY MOUTH EVERY DAY FOR GAD  . Dulaglutide (TRULICITY) 1.5 0000000 SOPN Inject 1.5 mg into the skin every Wednesday.  . fexofenadine-pseudoephedrine (ALLEGRA-D) 60-120 MG 12 hr tablet Take 1 tablet by mouth 2 (two) times daily.  . furosemide (LASIX) 20 MG tablet Take 1 tablet (20 mg total) by mouth daily.  Marland Kitchen guaiFENesin (MUCINEX) 600 MG 12 hr tablet Take 1 tablet (600 mg total) by mouth 2 (two) times daily.  Marland Kitchen ibuprofen (ADVIL) 600 MG tablet Take 1 tablet (600 mg total) by mouth every 8 (eight) hours as needed.  . Insulin Detemir (LEVEMIR FLEXTOUCH) 100 UNIT/ML Pen Inject 50units [place] daily.  Marland Kitchen  lisinopril-hydrochlorothiazide (ZESTORETIC) 20-25 MG tablet Take 1 tablet by mouth 2 (two) times daily.  . nitroGLYCERIN (NITROSTAT) 0.4 MG SL tablet Place 1 tablet (0.4 mg total) under the tongue every 5 (five) minutes x 3 doses as needed for chest pain.  . pantoprazole (PROTONIX) 40 MG tablet Take 1 tablet (40 mg total) by mouth 2 (two) times daily before a meal.  . nystatin-triamcinolone ointment (MYCOLOG) Apply 1 application topically 2 (two) times daily.   No facility-administered encounter medications on file as of 05/09/2019.      Medical History: Past Medical History:  Diagnosis Date  . Abnormal LFTs   . Carcinoma ([place])   . Diabetes mellitus without complication (HCC)    borderline  . Fundic gland polyps of stomach, benign   . Gastritis   . GERD (gastroesophageal reflux disease)   . Hepatic steatosis   . Hyperlipidemia   . Hypertension   . Nonischemic cardiomyopathy (HCC)    Previous ejection fraction of 35-40% on echo. 50% on cardiac catheterization in August of 2013  . Obesity   . Sleep apnea      Vital Signs: BP 130/80   Pulse 82   Temp 97.6 F (36.4 C)   Resp 16   Ht 6\' 3"  (1.905 m)   Wt (!) 326 lb (147.9 kg)   SpO2 96%   BMI 40.75 kg/m    Review of Systems  Constitutional: Negative.  Negative  for chills, fatigue and unexpected weight change.  HENT: Negative.  Negative for congestion, rhinorrhea, sneezing and sore throat.   Eyes: Negative for redness.  Respiratory: Negative.  Negative for cough, chest tightness and shortness of breath.   Cardiovascular: Negative.  Negative for chest pain and palpitations.  Gastrointestinal: Negative.  Negative for abdominal pain, constipation, diarrhea, nausea and vomiting.  Endocrine: Negative.   Genitourinary: Negative.  Negative for dysuria and frequency.  Musculoskeletal: Negative.  Negative for arthralgias, back pain, joint swelling and neck pain.  Skin: Negative.  Negative for rash.  Allergic/Immunologic:  Negative.   Neurological: Negative.  Negative for tremors and numbness.  Hematological: Negative for adenopathy. Does not bruise/bleed easily.  Psychiatric/Behavioral: Negative.  Negative for behavioral problems, sleep disturbance and suicidal ideas. The patient is not nervous/anxious.     Physical Exam Vitals and nursing note reviewed. Chaperone present: Raised red, painful rash at meatus, and skin.  Constitutional:      General: He is not in acute distress.    Appearance: He is well-developed. He is not diaphoretic.  HENT:     Head: Normocephalic and atraumatic.     Mouth/Throat:     Pharynx: No oropharyngeal exudate.  Eyes:     Pupils: Pupils are equal, round, and reactive to light.  Neck:     Thyroid: No thyromegaly.     Vascular: No JVD.     Trachea: No tracheal deviation.  Cardiovascular:     Rate and Rhythm: Normal rate and regular rhythm.     Heart sounds: Normal heart sounds. No murmur. No friction rub. No gallop.   Pulmonary:     Effort: Pulmonary effort is normal. No respiratory distress.     Breath sounds: Normal breath sounds. No wheezing or rales.  Chest:     Chest wall: No tenderness.  Abdominal:     Palpations: Abdomen is soft.     Tenderness: There is no abdominal tenderness. There is no guarding.  Genitourinary:    Epididymis:     Left: Left epididymis enlarged: es.  Musculoskeletal:        General: Normal range of motion.     Cervical back: Normal range of motion and neck supple.  Lymphadenopathy:     Cervical: No cervical adenopathy.  Skin:    General: Skin is warm and dry.  Neurological:     Mental Status: He is alert and oriented to person, place, and time.     Cranial Nerves: No cranial nerve deficit.  Psychiatric:        Behavior: Behavior normal.        Thought Content: Thought content normal.        Judgment: Judgment normal.    Assessment/Plan: 1. Balanitis Culture from [place] sent, Take Diflucan as prescribed. May use hydrocortisone  -  Herpes simplex virus culture - Diflucan 150mg  pills.   2. Urinary incontinence, unspecified type Have PSA checked - PSA  3. Penile rash - Herpes simplex virus culture  4. Essential hypertension Stable, continue current bp meds as directed.   General Counseling: axxel mastin understanding of the findings of todays visit and agrees with plan of treatment. I have discussed any further diagnostic evaluation that may be needed or ordered today. We also reviewed his medications today. he has been encouraged to call the office with any questions or concerns that should arise related to todays visit.   Orders Placed This Encounter  Procedures  . Herpes simplex virus culture  . PSA  Meds ordered this encounter  Medications  . nystatin-triamcinolone ointment (MYCOLOG)    Sig: Apply 1 application topically 2 (two) times daily.    Dispense:  30 g    Refill:  0    Time spent: 30 Minutes  This patient was seen by Orson Gear AGNP-C in Collaboration with Dr Lavera Guise as a part of collaborative care agreement.  Kendell Bane AGNP-C Internal Medicine

## 2019-05-10 LAB — HERPES SIMPLEX VIRUS CULTURE

## 2019-05-10 LAB — SPECIMEN STATUS REPORT

## 2019-05-12 LAB — CULTURE, URINE COMPREHENSIVE

## 2019-05-13 ENCOUNTER — Telehealth: Payer: Self-pay

## 2019-05-13 NOTE — Telephone Encounter (Signed)
Confirmed appointment on 05/17/2019 and screened for covid. klh

## 2019-05-14 ENCOUNTER — Other Ambulatory Visit: Payer: Self-pay | Admitting: Adult Health

## 2019-05-14 LAB — ANAEROBIC AND AEROBIC CULTURE

## 2019-05-14 LAB — SPECIMEN STATUS REPORT

## 2019-05-14 LAB — CULTURE, URINE COMPREHENSIVE

## 2019-05-14 LAB — PSA: Prostate Specific Ag, Serum: 0.9 ng/mL (ref 0.0–4.0)

## 2019-05-14 MED ORDER — AMOXICILLIN 500 MG PO CAPS: 500.0000 mg | ORAL_CAPSULE | Freq: Two times a day (BID) | ORAL | 0 refills | Status: DC

## 2019-05-14 NOTE — Progress Notes (Signed)
Group B Step infection.  Sent Amoxil to pharmacy.  Discussed with patient to take meds as prescribed. Follow up as scheduled.

## 2019-05-17 ENCOUNTER — Ambulatory Visit: Payer: 59 | Admitting: Adult Health

## 2019-05-17 ENCOUNTER — Other Ambulatory Visit: Payer: Self-pay

## 2019-05-17 ENCOUNTER — Encounter: Payer: Self-pay | Admitting: Adult Health

## 2019-05-17 VITALS — BP 156/88 | HR 73 | Temp 97.9°F | Resp 16 | Ht 75.0 in | Wt 322.0 lb

## 2019-05-17 DIAGNOSIS — E1165 Type 2 diabetes mellitus with hyperglycemia: Secondary | ICD-10-CM | POA: Diagnosis not present

## 2019-05-17 DIAGNOSIS — N481 Balanitis: Secondary | ICD-10-CM

## 2019-05-17 DIAGNOSIS — I10 Essential (primary) hypertension: Secondary | ICD-10-CM | POA: Diagnosis not present

## 2019-05-17 DIAGNOSIS — R32 Unspecified urinary incontinence: Secondary | ICD-10-CM

## 2019-05-17 DIAGNOSIS — A491 Streptococcal infection, unspecified site: Secondary | ICD-10-CM | POA: Diagnosis not present

## 2019-05-17 LAB — POCT URINALYSIS DIPSTICK
Bilirubin, UA: NEGATIVE
Blood, UA: NEGATIVE
Glucose, UA: POSITIVE — AB
Ketones, UA: NEGATIVE
Nitrite, UA: NEGATIVE
Protein, UA: POSITIVE — AB
Spec Grav, UA: 1.01 (ref 1.010–1.025)
Urobilinogen, UA: 0.2 E.U./dL
pH, UA: 5 (ref 5.0–8.0)

## 2019-05-17 LAB — POCT GLYCOSYLATED HEMOGLOBIN (HGB A1C): Hemoglobin A1C: 11.3 % — AB (ref 4.0–5.6)

## 2019-05-17 MED ORDER — FARXIGA 10 MG PO TABS: 10.0000 mg | ORAL_TABLET | Freq: Every day | ORAL | 1 refills | Status: DC

## 2019-05-17 MED ORDER — LEVEMIR FLEXTOUCH 100 UNIT/ML ~~LOC~~ SOPN: PEN_INJECTOR | SUBCUTANEOUS | 11 refills | Status: DC

## 2019-05-17 NOTE — Progress Notes (Signed)
[facility] [place], [place] [address]  Internal MEDICINE  Office Visit Note  Patient Name: Gerald [name]  [id number]  [id number]  Date of Service: 05/17/2019  Chief Complaint  Patient presents with  . Diabetes  . Hypertension  . Hyperlipidemia  . Follow-up    labs     HPI  Pt is here to follow up on DM, HTN, HLD and labs that were done.  Patient has group B strep of urine. He is currently on antibiotics for this.  His A1C today is 11.3.  He currently takes 50 units of Levemir.  He was unable to tolerate metformin in the past.  He does not currently use trulicity.  He was told to stop it. His blood pressure is slightly elevated. Denies Chest pain, Shortness of breath, palpitations, headache, or blurred vision.      Current Medication: Outpatient Encounter Medications as of 05/17/2019  Medication Sig  . acetaminophen (TYLENOL) 325 MG tablet Take 325-650 mg by mouth every 6 (six) hours as needed for mild pain or headache.  . albuterol (PROVENTIL HFA;VENTOLIN HFA) 108 (90 Base) MCG/ACT inhaler Inhale 2 puffs into the lungs every 6 (six) hours as needed for wheezing or shortness of breath.  Marland Kitchen amoxicillin (AMOXIL) 500 MG capsule Take 1 capsule (500 mg total) by mouth 2 (two) times daily.  Marland Kitchen aspirin 81 MG EC tablet Take 1 tablet (81 mg total) by mouth daily.  Marland Kitchen azithromycin (ZITHROMAX) 250 MG tablet z-pack - take as directed for 5 days for upper respiratory infection.  . citalopram (CELEXA) 40 MG tablet TAKE 1 TABLET BY MOUTH EVERY DAY FOR GAD  . Dulaglutide (TRULICITY) 1.5 0000000 SOPN Inject 1.5 mg into the skin every Wednesday.  . fexofenadine-pseudoephedrine (ALLEGRA-D) 60-120 MG 12 hr tablet Take 1 tablet by mouth 2 (two) times daily.  . fluconazole (DIFLUCAN) 150 MG tablet Take 1 tablet (150 mg total) by mouth every 3 (three) days.  . furosemide (LASIX) 20 MG tablet Take 1 tablet (20 mg total) by mouth daily.  Marland Kitchen guaiFENesin (MUCINEX) 600 MG 12 hr  tablet Take 1 tablet (600 mg total) by mouth 2 (two) times daily.  Marland Kitchen ibuprofen (ADVIL) 600 MG tablet Take 1 tablet (600 mg total) by mouth every 8 (eight) hours as needed.  . Insulin Detemir (LEVEMIR FLEXTOUCH) 100 UNIT/ML Pen Inject 50units [place] daily.  Marland Kitchen lisinopril-hydrochlorothiazide (ZESTORETIC) 20-25 MG tablet Take 1 tablet by mouth 2 (two) times daily.  . nitroGLYCERIN (NITROSTAT) 0.4 MG SL tablet Place 1 tablet (0.4 mg total) under the tongue every 5 (five) minutes x 3 doses as needed for chest pain.  Marland Kitchen nystatin-triamcinolone ointment (MYCOLOG) Apply 1 application topically 2 (two) times daily.  . pantoprazole (PROTONIX) 40 MG tablet Take 1 tablet (40 mg total) by mouth 2 (two) times daily before a meal.  . dapagliflozin propanediol (FARXIGA) 10 MG TABS tablet Take 10 mg by mouth daily.   No facility-administered encounter medications on file as of 05/17/2019.    Surgical History: Past Surgical History:  Procedure Laterality Date  . CARDIAC CATHETERIZATION N/A 09/2011   [facility]; EF 50% with 30% mid LAD stenosis and no obstructive disease.  Marland Kitchen CARDIAC CATHETERIZATION  09/2010   [facility]; Mid LAD 40% stenosis; Mid Circumflex:Normal; Mid RCA; Normal  . CARDIAC CATHETERIZATION    . COLONOSCOPY    . ESOPHAGOGASTRODUODENOSCOPY (EGD) WITH PROPOFOL N/A 06/10/2017   Procedure: ESOPHAGOGASTRODUODENOSCOPY (EGD) WITH PROPOFOL;  Surgeon: Jonathon Bellows, MD;  Location: [place];  Service: Gastroenterology;  Laterality: N/A;  . KNEE ARTHROSCOPY WITH MEDIAL MENISECTOMY Right 09/16/2012   Procedure: RIGHT KNEE ARTHROSCOPY WITH MEDIAL AND LATERAL MENISECTOMY, CHONDROPLASTY;  Surgeon: Ninetta Lights, MD;  Location: [place];  Service: Orthopedics;  Laterality: Right;  RIGHT KNEE SCOPE MEDIAL MENISCECTOMY  . LEFT HEART CATH AND CORONARY ANGIOGRAPHY N/A 06/11/2016   Procedure: Left Heart Cath and Coronary Angiography;  Surgeon: Minna Merritts, MD;  Location: [facility];  Service:  Cardiovascular;  Laterality: N/A;    Medical History: Past Medical History:  Diagnosis Date  . Abnormal LFTs   . Carcinoma ([place])   . Diabetes mellitus without complication (HCC)    borderline  . Fundic gland polyps of stomach, benign   . Gastritis   . GERD (gastroesophageal reflux disease)   . Hepatic steatosis   . Hyperlipidemia   . Hypertension   . Nonischemic cardiomyopathy (HCC)    Previous ejection fraction of 35-40% on echo. 50% on cardiac catheterization in August of 2013  . Obesity   . Sleep apnea     Family History: Family History  Problem Relation Age of Onset  . Heart disease Father 22       MI  . Heart attack Father   . Stomach cancer Father   . Hypertension Mother   . Breast cancer Mother     Social History   Socioeconomic History  . Marital status: Married    Spouse name: Not on file  . Number of children: 3  . Years of education: Not on file  . Highest education level: Not on file  Occupational History  . Occupation: Librarian, academic  Tobacco Use  . Smoking status: Never Smoker  . Smokeless tobacco: Never Used  Substance and Sexual Activity  . Alcohol use: Not Currently    Alcohol/week: 0.0 standard drinks    Comment: rare  . Drug use: No  . Sexual activity: Not on file  Other Topics Concern  . Not on file  Social History Narrative   Married.  34 yo son.   Wife on disability for bipolar disorder.     Social Determinants of Health   Financial Resource Strain:   . Difficulty of Paying Living Expenses:   Food Insecurity:   . Worried About Charity fundraiser in the Last Year:   . Arboriculturist in the Last Year:   Transportation Needs:   . Film/video editor (Medical):   Marland Kitchen Lack of Transportation (Non-Medical):   Physical Activity:   . Days of Exercise per Week:   . Minutes of Exercise per Session:   Stress:   . Feeling of Stress :   Social Connections:   . Frequency of Communication with Friends and Family:   . Frequency of  Social Gatherings with Friends and Family:   . Attends Religious Services:   . Active Member of Clubs or Organizations:   . Attends Archivist Meetings:   Marland Kitchen Marital Status:   Intimate Partner Violence:   . Fear of Current or Ex-Partner:   . Emotionally Abused:   Marland Kitchen Physically Abused:   . Sexually Abused:       Review of Systems  Constitutional: Negative.  Negative for chills, fatigue and unexpected weight change.  HENT: Negative.  Negative for congestion, rhinorrhea, sneezing and sore throat.   Eyes: Negative for redness.  Respiratory: Negative.  Negative for cough, chest tightness and shortness of breath.   Cardiovascular: Negative.  Negative for chest pain and palpitations.  Gastrointestinal: Negative.  Negative for abdominal pain, constipation, diarrhea, nausea and vomiting.  Endocrine: Negative.   Genitourinary: Negative.  Negative for dysuria and frequency.  Musculoskeletal: Negative.  Negative for arthralgias, back pain, joint swelling and neck pain.  Skin: Negative.  Negative for rash.  Allergic/Immunologic: Negative.   Neurological: Negative.  Negative for tremors and numbness.  Hematological: Negative for adenopathy. Does not bruise/bleed easily.  Psychiatric/Behavioral: Negative.  Negative for behavioral problems, sleep disturbance and suicidal ideas. The patient is not nervous/anxious.     Vital Signs: BP (!) 163/93   Pulse 73   Temp 97.9 F (36.6 C)   Resp 16   Ht 6\' 3"  (1.905 m)   Wt (!) 322 lb (146.1 kg)   SpO2 97%   BMI 40.25 kg/m    Physical Exam Vitals and nursing note reviewed.  Constitutional:      General: He is not in acute distress.    Appearance: He is well-developed. He is not diaphoretic.  HENT:     Head: Normocephalic and atraumatic.     Mouth/Throat:     Pharynx: No oropharyngeal exudate.  Eyes:     Pupils: Pupils are equal, round, and reactive to light.  Neck:     Thyroid: No thyromegaly.     Vascular: No JVD.     Trachea:  No tracheal deviation.  Cardiovascular:     Rate and Rhythm: Normal rate and regular rhythm.     Heart sounds: Normal heart sounds. No murmur. No friction rub. No gallop.   Pulmonary:     Effort: Pulmonary effort is normal. No respiratory distress.     Breath sounds: Normal breath sounds. No wheezing or rales.  Chest:     Chest wall: No tenderness.  Abdominal:     Palpations: Abdomen is soft.     Tenderness: There is no abdominal tenderness. There is no guarding.  Musculoskeletal:        General: Normal range of motion.     Cervical back: Normal range of motion and neck supple.  Lymphadenopathy:     Cervical: No cervical adenopathy.  Skin:    General: Skin is warm and dry.  Neurological:     Mental Status: He is alert and oriented to person, place, and time.     Cranial Nerves: No cranial nerve deficit.  Psychiatric:        Behavior: Behavior normal.        Thought Content: Thought content normal.        Judgment: Judgment normal.     Assessment/Plan: 1. Type 2 diabetes mellitus with hyperglycemia, unspecified whether long term insulin use (HCC) Increase Levemir by 2 units every other day until morning blood sugar is less than 200 or he reaches 64 units daily.  Start farxiga.  Sample and coupon given.  - POCT HgB A1C - dapagliflozin propanediol (FARXIGA) 10 MG TABS tablet; Take 10 mg by mouth daily.  Dispense: 90 tablet; Refill: 1 - insulin detemir (LEVEMIR FLEXTOUCH) 100 UNIT/ML FlexPen; Inject 60 units [place] daily.  Dispense: 15 mL; Refill: 11  2. Essential hypertension Continue to monitor.   3. Bacterial infection due to Streptococcus, group B On Amoxil, return if symptoms do not resolve.   4. Balanitis Resolved.  5. Urinary incontinence, unspecified type If symptoms continue beyond antibiotic completion, follow up in office.   General Counseling: carmyne rudy understanding of the findings of todays visit and agrees with plan of treatment. I  have discussed any  further diagnostic evaluation that may be needed or ordered today. We also reviewed his medications today. he has been encouraged to call the office with any questions or concerns that should arise related to todays visit.    Orders Placed This Encounter  Procedures  . POCT HgB A1C    Meds ordered this encounter  Medications  . dapagliflozin propanediol (FARXIGA) 10 MG TABS tablet    Sig: Take 10 mg by mouth daily.    Dispense:  90 tablet    Refill:  1    Time spent: 25 Minutes   This patient was seen by Orson Gear AGNP-C in Collaboration with Dr Lavera Guise as a part of collaborative care agreement     Kendell Bane AGNP-C Internal medicine

## 2019-05-18 ENCOUNTER — Other Ambulatory Visit: Payer: Self-pay

## 2019-05-18 MED ORDER — JARDIANCE 10 MG PO TABS: 10.0000 mg | ORAL_TABLET | Freq: Every day | ORAL | 1 refills | Status: DC

## 2019-07-25 ENCOUNTER — Telehealth: Payer: Self-pay

## 2019-07-25 NOTE — Telephone Encounter (Signed)
Confirmed appointment on 07/27/2019 and screened for covid. klh

## 2019-07-27 ENCOUNTER — Encounter: Payer: Self-pay | Admitting: Adult Health

## 2019-07-27 ENCOUNTER — Ambulatory Visit (INDEPENDENT_AMBULATORY_CARE_PROVIDER_SITE_OTHER): Payer: 59 | Admitting: Adult Health

## 2019-07-27 ENCOUNTER — Other Ambulatory Visit: Payer: Self-pay

## 2019-07-27 VITALS — BP 180/106 | HR 75 | Temp 97.6°F | Resp 16 | Ht 75.0 in | Wt 321.2 lb

## 2019-07-27 DIAGNOSIS — Z6841 Body Mass Index (BMI) 40.0 and over, adult: Secondary | ICD-10-CM

## 2019-07-27 DIAGNOSIS — Z0001 Encounter for general adult medical examination with abnormal findings: Secondary | ICD-10-CM | POA: Diagnosis not present

## 2019-07-27 DIAGNOSIS — E782 Mixed hyperlipidemia: Secondary | ICD-10-CM

## 2019-07-27 DIAGNOSIS — I10 Essential (primary) hypertension: Secondary | ICD-10-CM | POA: Diagnosis not present

## 2019-07-27 DIAGNOSIS — R300 Dysuria: Secondary | ICD-10-CM

## 2019-07-27 DIAGNOSIS — K219 Gastro-esophageal reflux disease without esophagitis: Secondary | ICD-10-CM

## 2019-07-27 DIAGNOSIS — E1165 Type 2 diabetes mellitus with hyperglycemia: Secondary | ICD-10-CM

## 2019-07-27 DIAGNOSIS — Z79899 Other long term (current) drug therapy: Secondary | ICD-10-CM

## 2019-07-27 MED ORDER — AMLODIPINE BESYLATE 5 MG PO TABS: 5.0000 mg | ORAL_TABLET | Freq: Every day | ORAL | 0 refills | Status: DC

## 2019-07-27 MED ORDER — INSULIN GLARGINE 100 UNIT/ML ~~LOC~~ SOLN: 62.0000 [IU] | Freq: Every day | SUBCUTANEOUS | 11 refills | Status: DC

## 2019-07-27 NOTE — Progress Notes (Signed)
[facility] [place], [place] [address]  Internal MEDICINE  Office Visit Note  Patient Name: Gerald [name]  [id number]  [id number]  Date of Service: 07/27/2019  Chief Complaint  Patient presents with   Annual Exam    lower back pain going down to left leg,    Diabetes    sugar averaging between 400-500   Gastroesophageal Reflux   Hyperlipidemia   Hypertension     HPI Pt is here for routine health maintenance examination. He is a well appearing 54 yo male.  He has a history of DM, GERD, HTN, and HLD. He reports some issues with his blood sugars being elevated.  He reports a week of sugars that were over 400.  His blood pressures have been very elevated at home.  Today in office it is high.  He Denies Chest pain, Shortness of breath, palpitations, headache, or blurred vision currently.  He only takes Lisinopril-HCTZ at this time, and he is on the max dose. His GERD appears controlled.     Current Medication: Outpatient Encounter Medications as of 07/27/2019  Medication Sig   acetaminophen (TYLENOL) 325 MG tablet Take 325-650 mg by mouth every 6 (six) hours as needed for mild pain or headache.   albuterol (PROVENTIL HFA;VENTOLIN HFA) 108 (90 Base) MCG/ACT inhaler Inhale 2 puffs into the lungs every 6 (six) hours as needed for wheezing or shortness of breath.   amoxicillin (AMOXIL) 500 MG capsule Take 1 capsule (500 mg total) by mouth 2 (two) times daily.   aspirin 81 MG EC tablet Take 1 tablet (81 mg total) by mouth daily.   azithromycin (ZITHROMAX) 250 MG tablet z-pack - take as directed for 5 days for upper respiratory infection.   citalopram (CELEXA) 40 MG tablet TAKE 1 TABLET BY MOUTH EVERY DAY FOR GAD   Dulaglutide (TRULICITY) 1.5 HA/1.9FX SOPN Inject 1.5 mg into the skin every Wednesday.   empagliflozin (JARDIANCE) 10 MG TABS tablet Take 10 mg by mouth daily.   fexofenadine-pseudoephedrine (ALLEGRA-D) 60-120 MG 12 hr tablet Take 1  tablet by mouth 2 (two) times daily.   fluconazole (DIFLUCAN) 150 MG tablet Take 1 tablet (150 mg total) by mouth every 3 (three) days.   guaiFENesin (MUCINEX) 600 MG 12 hr tablet Take 1 tablet (600 mg total) by mouth 2 (two) times daily.   ibuprofen (ADVIL) 600 MG tablet Take 1 tablet (600 mg total) by mouth every 8 (eight) hours as needed.   insulin detemir (LEVEMIR FLEXTOUCH) 100 UNIT/ML FlexPen Inject 60 units [place] daily.   lisinopril-hydrochlorothiazide (ZESTORETIC) 20-25 MG tablet Take 1 tablet by mouth 2 (two) times daily.   nitroGLYCERIN (NITROSTAT) 0.4 MG SL tablet Place 1 tablet (0.4 mg total) under the tongue every 5 (five) minutes x 3 doses as needed for chest pain.   nystatin-triamcinolone ointment (MYCOLOG) Apply 1 application topically 2 (two) times daily.   pantoprazole (PROTONIX) 40 MG tablet Take 1 tablet (40 mg total) by mouth 2 (two) times daily before a meal.   [DISCONTINUED] furosemide (LASIX) 20 MG tablet Take 1 tablet (20 mg total) by mouth daily.   insulin glargine (LANTUS) 100 UNIT/ML injection Inject 0.62 mLs (62 Units total) into the skin daily.   No facility-administered encounter medications on file as of 07/27/2019.    Surgical History: Past Surgical History:  Procedure Laterality Date   CARDIAC CATHETERIZATION N/A 09/2011   [facility]; EF 50% with 30% mid LAD stenosis and no obstructive disease.   CARDIAC CATHETERIZATION  09/2010   [place]; Mid LAD 40% stenosis; Mid Circumflex:Normal; Mid RCA; Normal   CARDIAC CATHETERIZATION     COLONOSCOPY     ESOPHAGOGASTRODUODENOSCOPY (EGD) WITH PROPOFOL N/A 06/10/2017   Procedure: ESOPHAGOGASTRODUODENOSCOPY (EGD) WITH PROPOFOL;  Surgeon: Jonathon Bellows, MD;  Location: [facility] ENDOSCOPY;  Service: Gastroenterology;  Laterality: N/A;   KNEE ARTHROSCOPY WITH MEDIAL MENISECTOMY Right 09/16/2012   Procedure: RIGHT KNEE ARTHROSCOPY WITH MEDIAL AND LATERAL MENISECTOMY, CHONDROPLASTY;  Surgeon: Ninetta Lights, MD;  Location: [place];  Service: Orthopedics;  Laterality: Right;  RIGHT KNEE SCOPE MEDIAL MENISCECTOMY   LEFT HEART CATH AND CORONARY ANGIOGRAPHY N/A 06/11/2016   Procedure: Left Heart Cath and Coronary Angiography;  Surgeon: Minna Merritts, MD;  Location: [facility];  Service: Cardiovascular;  Laterality: N/A;    Medical History: Past Medical History:  Diagnosis Date   Abnormal LFTs    Carcinoma ([place])    Diabetes mellitus without complication (HCC)    borderline   Fundic gland polyps of stomach, benign    Gastritis    GERD (gastroesophageal reflux disease)    Hepatic steatosis    Hyperlipidemia    Hypertension    Nonischemic cardiomyopathy (HCC)    Previous ejection fraction of 35-40% on echo. 50% on cardiac catheterization in August of 2013   Obesity    Sleep apnea     Family History: Family History  Problem Relation Age of Onset   Heart disease Father 6       MI   Heart attack Father    Stomach cancer Father    Hypertension Mother    Breast cancer Mother       Review of Systems   Vital Signs: BP (!) 193/106    Pulse 75    Temp 97.6 F (36.4 C)    Resp 16    Ht 6\' 3"  (1.905 m)    Wt (!) 321 lb 3.2 oz (145.7 kg)    SpO2 97%    BMI 40.15 kg/m    Physical Exam   LABS: Recent Results (from the past 2160 hour(s))  CULTURE, URINE COMPREHENSIVE     Status: Abnormal   Collection Time: 05/09/19 12:00 AM   Specimen: Urine   URINE  Result Value Ref Range   Urine Culture, Comprehensive Final report (A)    Organism ID, Bacteria Comment (A)     Comment: Beta hemolytic Streptococcus, group B 25,000-50,000 colony forming units per mL Penicillin and ampicillin are drugs of choice for treatment of beta-hemolytic streptococcal infections. Susceptibility testing of penicillins and other beta-lactam agents approved by the FDA for treatment of beta-hemolytic streptococcal infections need not be performed routinely because nonsusceptible  isolates are extremely rare in any beta-hemolytic streptococcus and have not been reported for Streptococcus pyogenes (group A). (CLSI)   Herpes simplex virus culture     Status: None   Collection Time: 05/09/19 12:00 AM   OTHER  Result Value Ref Range   HSV Culture/Type CANCELED     Comment: Test not performed. No viral transport device received.  Result canceled by the ancillary.   Specimen status report     Status: None   Collection Time: 05/09/19 12:00 AM  Result Value Ref Range   specimen status report Comment     Comment: NTI Bact Swab (aer/ana) Trpt NTI Bact Swab (aer/ana) Trpt Comment: A bacterial swab transport was received with no test indicated. If testing is required on this specimen, please contact the [employer]  Department to obtain a Request for Written Authorization Form.   Anaerobic and Aerobic Culture     Status: Abnormal   Collection Time: 05/09/19 12:00 AM   OTHER  Result Value Ref Range   Anaerobic Culture Final report    Result 1 Comment     Comment: No anaerobic growth in 72 hours.   Aerobic Culture Final report (A)    Result 1 Comment (A)     Comment: Beta hemolytic Streptococcus, group B Heavy growth Penicillin and ampicillin are drugs of choice for treatment of beta-hemolytic streptococcal infections. Susceptibility testing of penicillins and other beta-lactam agents approved by the FDA for treatment of beta-hemolytic streptococcal infections need not be performed routinely because nonsusceptible isolates are extremely rare in any beta-hemolytic streptococcus and have not been reported for Streptococcus pyogenes (group A). (CLSI)    Result 2 Routine flora     Comment: Heavy growth  Specimen status report     Status: None   Collection Time: 05/09/19 12:00 AM  Result Value Ref Range   specimen status report Comment     Comment: Written Authorization Written Authorization Written Authorization  Received. Authorization received from [place] 05-10-2019 Logged by Denita Lung   PSA     Status: None   Collection Time: 05/10/19  9:27 AM  Result Value Ref Range   Prostate Specific Ag, Serum 0.9 0.0 - 4.0 ng/mL    Comment: Roche ECLIA methodology. According to the American Urological Association, Serum PSA should decrease and remain at undetectable levels after radical prostatectomy. The AUA defines biochemical recurrence as an initial PSA value 0.2 ng/mL or greater followed by a subsequent confirmatory PSA value 0.2 ng/mL or greater. Values obtained with different assay methods or kits cannot be used interchangeably. Results cannot be interpreted as absolute evidence of the presence or absence of malignant disease.   CULTURE, URINE COMPREHENSIVE     Status: Abnormal   Collection Time: 05/10/19  9:27 AM   UR  Result Value Ref Range   Urine Culture, Comprehensive Final report (A)    Organism ID, Bacteria Comment (A)     Comment: Beta hemolytic Streptococcus, group B 25,000-50,000 colony forming units per mL Penicillin and ampicillin are drugs of choice for treatment of beta-hemolytic streptococcal infections. Susceptibility testing of penicillins and other beta-lactam agents approved by the FDA for treatment of beta-hemolytic streptococcal infections need not be performed routinely because nonsusceptible isolates are extremely rare in any beta-hemolytic streptococcus and have not been reported for Streptococcus pyogenes (group A). (CLSI)   POCT Urinalysis Dipstick     Status: Abnormal   Collection Time: 05/17/19  8:55 AM  Result Value Ref Range   Color, UA     Clarity, UA     Glucose, UA Positive (A) Negative   Bilirubin, UA negative    Ketones, UA negative    Spec Grav, UA 1.010 1.010 - 1.025   Blood, UA negative    pH, UA 5.0 5.0 - 8.0   Protein, UA Positive (A) Negative   Urobilinogen, UA 0.2 0.2 or 1.0 E.U./dL   Nitrite, UA negative    Leukocytes, UA Trace  (A) Negative   Appearance     Odor    POCT HgB A1C     Status: Abnormal   Collection Time: 05/17/19  1:01 PM  Result Value Ref Range   Hemoglobin A1C 11.3 (A) 4.0 - 5.6 %   HbA1c POC (<> result, manual entry)     HbA1c, POC (prediabetic range)  HbA1c, POC (controlled diabetic range)       Assessment/Plan: 1. Encounter for general adult medical examination with abnormal findings Up to date on PHM, have labs drawn. - CBC with Differential/Platelet - Lipid Panel With LDL/HDL Ratio - TSH - T4, free - Comprehensive metabolic panel  2. Type 2 diabetes mellitus with hyperglycemia, unspecified whether long term insulin use ([place]) Patients insurance will not pay for Levemir anymore.  Changed to lantus. Discussed dosing with patient.  - insulin glargine (LANTUS) 100 UNIT/ML injection; Inject 0.62 mLs (62 Units total) into the skin daily.  Dispense: 10 mL; Refill: 11  3. Essential hypertension Start 5mg  amlodipine.  Return to office in 4 days.  - amLODipine (NORVASC) 5 MG tablet; Take 1 tablet (5 mg total) by mouth daily.  Dispense: 30 tablet; Refill: 0  4. Mixed hyperlipidemia Will monitor lipid panel.   5. BMI 40.0-44.9, adult (HCC) Obesity Counseling: Risk Assessment: An assessment of behavioral risk factors was made today and includes lack of exercise sedentary lifestyle, lack of portion control and poor dietary habits.  Risk Modification Advice: She was counseled on portion control guidelines. Restricting daily caloric intake to 1600. The detrimental long term effects of obesity on her health and ongoing poor compliance was also discussed with the patient.  6. Morbid obesity ([place]) BMI over 40  7. Gastroesophageal reflux disease without esophagitis Stable, continue to use medication as directed.   8. Dysuria - Urinalysis, Routine w reflex microscopic  9. Long-term use of high-risk medication - CBC with Differential/Platelet - Lipid Panel With LDL/HDL Ratio - TSH - T4,  free - Comprehensive metabolic panel  General Counseling: Gerald [name] verbalizes understanding of the findings of todays visit and agrees with plan of treatment. I have discussed any further diagnostic evaluation that may be needed or ordered today. We also reviewed his medications today. he has been encouraged to call the office with any questions or concerns that should arise related to todays visit.   Orders Placed This Encounter  Procedures   Urinalysis, Routine w reflex microscopic    Meds ordered this encounter  Medications   insulin glargine (LANTUS) 100 UNIT/ML injection    Sig: Inject 0.62 mLs (62 Units total) into the skin daily.    Dispense:  10 mL    Refill:  11    Time spent: 30 Minutes   This patient was seen by Orson Gear AGNP-C in Collaboration with Dr Lavera Guise as a part of collaborative care agreement    Kendell Bane AGNP-C Internal Medicine

## 2019-07-28 ENCOUNTER — Telehealth: Payer: Self-pay

## 2019-07-28 LAB — URINALYSIS, ROUTINE W REFLEX MICROSCOPIC
Bilirubin, UA: NEGATIVE
Ketones, UA: NEGATIVE
Leukocytes,UA: NEGATIVE
Nitrite, UA: NEGATIVE
RBC, UA: NEGATIVE
Specific Gravity, UA: >=1.03 — AB (ref 1.005–1.030)
Urobilinogen, Ur: 1 mg/dL (ref 0.2–1.0)
pH, UA: 5.5 (ref 5.0–7.5)

## 2019-07-28 LAB — MICROSCOPIC EXAMINATION
Bacteria, UA: NONE SEEN
Casts: NONE SEEN /lpf
WBC, UA: NONE SEEN /hpf (ref 0–5)

## 2019-07-28 NOTE — Telephone Encounter (Signed)
Called lmom informing patient of appointment on 08/01/2019. klh

## 2019-08-01 ENCOUNTER — Ambulatory Visit: Payer: 59 | Admitting: Adult Health

## 2019-08-02 ENCOUNTER — Other Ambulatory Visit: Payer: Self-pay

## 2019-08-02 ENCOUNTER — Encounter: Payer: Self-pay | Admitting: Adult Health

## 2019-08-02 ENCOUNTER — Ambulatory Visit: Payer: 59 | Admitting: Adult Health

## 2019-08-02 VITALS — BP 179/96 | HR 78 | Temp 97.5°F | Resp 16 | Ht 75.0 in | Wt 321.0 lb

## 2019-08-02 DIAGNOSIS — E1165 Type 2 diabetes mellitus with hyperglycemia: Secondary | ICD-10-CM | POA: Diagnosis not present

## 2019-08-02 DIAGNOSIS — Z79899 Other long term (current) drug therapy: Secondary | ICD-10-CM

## 2019-08-02 DIAGNOSIS — E782 Mixed hyperlipidemia: Secondary | ICD-10-CM

## 2019-08-02 DIAGNOSIS — I10 Essential (primary) hypertension: Secondary | ICD-10-CM | POA: Diagnosis not present

## 2019-08-02 DIAGNOSIS — R5383 Other fatigue: Secondary | ICD-10-CM

## 2019-08-02 MED ORDER — AMLODIPINE BESYLATE 10 MG PO TABS: 10.0000 mg | ORAL_TABLET | Freq: Every day | ORAL | 2 refills | Status: DC

## 2019-08-02 NOTE — Progress Notes (Signed)
[facility] [place], [place] [address]  Internal MEDICINE  Office Visit Note  Patient Name: Gerald [name]  [id number]  [id number]  Date of Service: 08/02/2019  Chief Complaint  Patient presents with  . Diabetes  . Hyperlipidemia  . Hypertension  . Gastroesophageal Reflux  . Fatigue    no energy, no rest    HPI  Pt is here for follow up on HTN.  He was seen 5 days ago, and his blood pressure was elevated 180/106.  Amlodipine 5mg  was added to his medications and his bp today is improved to 179/96.  He continues to Denies Chest pain, Shortness of breath, palpitations, headache, or blurred vision.  He reports his blood sugars have come down to the low 200's over the last week.  He continues to have fatigue, he did not get his labs drawn yet, he plans to have them drawn today when he leaves.     Current Medication: Outpatient Encounter Medications as of 08/02/2019  Medication Sig  . acetaminophen (TYLENOL) 325 MG tablet Take 325-650 mg by mouth every 6 (six) hours as needed for mild pain or headache.  . albuterol (PROVENTIL HFA;VENTOLIN HFA) 108 (90 Base) MCG/ACT inhaler Inhale 2 puffs into the lungs every 6 (six) hours as needed for wheezing or shortness of breath.  Marland Kitchen amLODipine (NORVASC) 10 MG tablet Take 1 tablet (10 mg total) by mouth daily.  Marland Kitchen amoxicillin (AMOXIL) 500 MG capsule Take 1 capsule (500 mg total) by mouth 2 (two) times daily.  Marland Kitchen aspirin 81 MG EC tablet Take 1 tablet (81 mg total) by mouth daily.  Marland Kitchen azithromycin (ZITHROMAX) 250 MG tablet z-pack - take as directed for 5 days for upper respiratory infection.  . citalopram (CELEXA) 40 MG tablet TAKE 1 TABLET BY MOUTH EVERY DAY FOR GAD  . Dulaglutide (TRULICITY) 1.5 ZL/9.3TT SOPN Inject 1.5 mg into the skin every Wednesday.  . empagliflozin (JARDIANCE) 10 MG TABS tablet Take 10 mg by mouth daily.  . fexofenadine-pseudoephedrine (ALLEGRA-D) 60-120 MG 12 hr tablet Take 1 tablet by mouth 2 (two)  times daily.  . fluconazole (DIFLUCAN) 150 MG tablet Take 1 tablet (150 mg total) by mouth every 3 (three) days.  Marland Kitchen guaiFENesin (MUCINEX) 600 MG 12 hr tablet Take 1 tablet (600 mg total) by mouth 2 (two) times daily.  Marland Kitchen ibuprofen (ADVIL) 600 MG tablet Take 1 tablet (600 mg total) by mouth every 8 (eight) hours as needed.  . insulin detemir (LEVEMIR FLEXTOUCH) 100 UNIT/ML FlexPen Inject 60 units [place] daily.  . insulin glargine (LANTUS) 100 UNIT/ML injection Inject 0.62 mLs (62 Units total) into the skin daily.  Marland Kitchen lisinopril-hydrochlorothiazide (ZESTORETIC) 20-25 MG tablet Take 1 tablet by mouth 2 (two) times daily.  . nitroGLYCERIN (NITROSTAT) 0.4 MG SL tablet Place 1 tablet (0.4 mg total) under the tongue every 5 (five) minutes x 3 doses as needed for chest pain.  Marland Kitchen nystatin-triamcinolone ointment (MYCOLOG) Apply 1 application topically 2 (two) times daily.  . pantoprazole (PROTONIX) 40 MG tablet Take 1 tablet (40 mg total) by mouth 2 (two) times daily before a meal.  . [DISCONTINUED] amLODipine (NORVASC) 5 MG tablet Take 1 tablet (5 mg total) by mouth daily.   No facility-administered encounter medications on file as of 08/02/2019.    Surgical History: Past Surgical History:  Procedure Laterality Date  . CARDIAC CATHETERIZATION N/A 09/2011   [facility]; EF 50% with 30% mid LAD stenosis and no obstructive disease.  Marland Kitchen CARDIAC CATHETERIZATION  09/2010   [place]; Mid LAD 40% stenosis; Mid Circumflex:Normal; Mid RCA; Normal  . CARDIAC CATHETERIZATION    . COLONOSCOPY    . ESOPHAGOGASTRODUODENOSCOPY (EGD) WITH PROPOFOL N/A 06/10/2017   Procedure: ESOPHAGOGASTRODUODENOSCOPY (EGD) WITH PROPOFOL;  Surgeon: Jonathon Bellows, MD;  Location: [facility] ENDOSCOPY;  Service: Gastroenterology;  Laterality: N/A;  . KNEE ARTHROSCOPY WITH MEDIAL MENISECTOMY Right 09/16/2012   Procedure: RIGHT KNEE ARTHROSCOPY WITH MEDIAL AND LATERAL MENISECTOMY, CHONDROPLASTY;  Surgeon: Ninetta Lights, MD;  Location: [facility];   Service: Orthopedics;  Laterality: Right;  RIGHT KNEE SCOPE MEDIAL MENISCECTOMY  . LEFT HEART CATH AND CORONARY ANGIOGRAPHY N/A 06/11/2016   Procedure: Left Heart Cath and Coronary Angiography;  Surgeon: Minna Merritts, MD;  Location: [facility] CV LAB;  Service: Cardiovascular;  Laterality: N/A;    Medical History: Past Medical History:  Diagnosis Date  . Abnormal LFTs   . Carcinoma ([place])   . Diabetes mellitus without complication (HCC)    borderline  . Fundic gland polyps of stomach, benign   . Gastritis   . GERD (gastroesophageal reflux disease)   . Hepatic steatosis   . Hyperlipidemia   . Hypertension   . Nonischemic cardiomyopathy (HCC)    Previous ejection fraction of 35-40% on echo. 50% on cardiac catheterization in August of 2013  . Obesity   . Sleep apnea     Family History: Family History  Problem Relation Age of Onset  . Heart disease Father 49       MI  . Heart attack Father   . Stomach cancer Father   . Hypertension Mother   . Breast cancer Mother     Social History   Socioeconomic History  . Marital status: Married    Spouse name: Not on file  . Number of children: 3  . Years of education: Not on file  . Highest education level: Not on file  Occupational History  . Occupation: Librarian, academic  Tobacco Use  . Smoking status: Never Smoker  . Smokeless tobacco: Never Used  Vaping Use  . Vaping Use: Never used  Substance and Sexual Activity  . Alcohol use: Not Currently    Alcohol/week: 0.0 standard drinks    Comment: rare  . Drug use: No  . Sexual activity: Not on file  Other Topics Concern  . Not on file  Social History Narrative   Married.  82 yo son.   Wife on disability for bipolar disorder.     Social Determinants of Health   Financial Resource Strain:   . Difficulty of Paying Living Expenses:   Food Insecurity:   . Worried About Charity fundraiser in the Last Year:   . Arboriculturist in the Last Year:   Transportation  Needs:   . Film/video editor (Medical):   Marland Kitchen Lack of Transportation (Non-Medical):   Physical Activity:   . Days of Exercise per Week:   . Minutes of Exercise per Session:   Stress:   . Feeling of Stress :   Social Connections:   . Frequency of Communication with Friends and Family:   . Frequency of Social Gatherings with Friends and Family:   . Attends Religious Services:   . Active Member of Clubs or Organizations:   . Attends Archivist Meetings:   Marland Kitchen Marital Status:   Intimate Partner Violence:   . Fear of Current or Ex-Partner:   . Emotionally Abused:   Marland Kitchen Physically Abused:   . Sexually  Abused:       Review of Systems  Constitutional: Negative.  Negative for chills, fatigue and unexpected weight change.  HENT: Negative.  Negative for congestion, rhinorrhea, sneezing and sore throat.   Eyes: Negative for redness.  Respiratory: Negative.  Negative for cough, chest tightness and shortness of breath.   Cardiovascular: Negative.  Negative for chest pain and palpitations.  Gastrointestinal: Negative.  Negative for abdominal pain, constipation, diarrhea, nausea and vomiting.  Endocrine: Negative.   Genitourinary: Negative.  Negative for dysuria and frequency.  Musculoskeletal: Negative.  Negative for arthralgias, back pain, joint swelling and neck pain.  Skin: Negative.  Negative for rash.  Allergic/Immunologic: Negative.   Neurological: Negative.  Negative for tremors and numbness.  Hematological: Negative for adenopathy. Does not bruise/bleed easily.  Psychiatric/Behavioral: Negative.  Negative for behavioral problems, sleep disturbance and suicidal ideas. The patient is not nervous/anxious.     Vital Signs: BP (!) 179/96   Pulse 78   Temp (!) 97.5 F (36.4 C)   Resp 16   Ht 6\' 3"  (1.905 m)   Wt (!) 321 lb (145.6 kg)   SpO2 98%   BMI 40.12 kg/m    Physical Exam Vitals and nursing note reviewed.  Constitutional:      General: He is not in acute  distress.    Appearance: He is well-developed. He is not diaphoretic.  HENT:     Head: Normocephalic and atraumatic.     Mouth/Throat:     Pharynx: No oropharyngeal exudate.  Eyes:     Pupils: Pupils are equal, round, and reactive to light.  Neck:     Thyroid: No thyromegaly.     Vascular: No JVD.     Trachea: No tracheal deviation.  Cardiovascular:     Rate and Rhythm: Normal rate and regular rhythm.     Heart sounds: Normal heart sounds. No murmur heard.  No friction rub. No gallop.   Pulmonary:     Effort: Pulmonary effort is normal. No respiratory distress.     Breath sounds: Normal breath sounds. No wheezing or rales.  Chest:     Chest wall: No tenderness.  Abdominal:     Palpations: Abdomen is soft.     Tenderness: There is no abdominal tenderness. There is no guarding.  Musculoskeletal:        General: Normal range of motion.     Cervical back: Normal range of motion and neck supple.  Lymphadenopathy:     Cervical: No cervical adenopathy.  Skin:    General: Skin is warm and dry.  Neurological:     Mental Status: He is alert and oriented to person, place, and time.     Cranial Nerves: No cranial nerve deficit.  Psychiatric:        Behavior: Behavior normal.        Thought Content: Thought content normal.        Judgment: Judgment normal.    Assessment/Plan: 1. Essential hypertension Increase Norvasc to 10mg , patient is not having side effects and blood pressure is improving.  - amLODipine (NORVASC) 10 MG tablet; Take 1 tablet (10 mg total) by mouth daily.  Dispense: 30 tablet; Refill: 2  2. Other fatigue Have labs drawn and follow up as discussed.  - Vitamin D (25 hydroxy) - B12 and Folate Panel - Fe+TIBC+Fer  3. Encounter for long-term current use of high risk medication - Vitamin D (25 hydroxy) - B12 and Folate Panel - Fe+TIBC+Fer  4. Type 2 diabetes mellitus  with hyperglycemia, unspecified whether long term insulin use (HCC) Continue to monitor blood  sugars and continue present management with medications.   5. Mixed hyperlipidemia Continue current management.   General Counseling: quinton voth understanding of the findings of todays visit and agrees with plan of treatment. I have discussed any further diagnostic evaluation that may be needed or ordered today. We also reviewed his medications today. he has been encouraged to call the office with any questions or concerns that should arise related to todays visit.    Orders Placed This Encounter  Procedures  . Vitamin D (25 hydroxy)  . B12 and Folate Panel  . Fe+TIBC+Fer    Meds ordered this encounter  Medications  . amLODipine (NORVASC) 10 MG tablet    Sig: Take 1 tablet (10 mg total) by mouth daily.    Dispense:  30 tablet    Refill:  2    Time spent: 30 Minutes   This patient was seen by Orson Gear AGNP-C in Collaboration with Dr Lavera Guise as a part of collaborative care agreement     Kendell Bane AGNP-C Internal medicine

## 2019-08-03 LAB — B12 AND FOLATE PANEL
Folate: 10.8 ng/mL (ref >3.0)
Vitamin B-12: 493 pg/mL (ref 232–1245)

## 2019-08-03 LAB — COMPREHENSIVE METABOLIC PANEL
ALT: 65 IU/L — ABNORMAL HIGH (ref 0–44)
AST: 41 IU/L — ABNORMAL HIGH (ref 0–40)
Albumin/Globulin Ratio: 1.7 (ref 1.2–2.2)
Albumin: 4.2 g/dL (ref 3.8–4.9)
Alkaline Phosphatase: 126 IU/L — ABNORMAL HIGH (ref 48–121)
BUN/Creatinine Ratio: 11 (ref 9–20)
BUN: 10 mg/dL (ref 6–24)
Bilirubin Total: 0.7 mg/dL (ref 0.0–1.2)
CO2: 25 mmol/L (ref 20–29)
Calcium: 9.6 mg/dL (ref 8.7–10.2)
Chloride: 96 mmol/L (ref 96–106)
Creatinine, Ser: 0.89 mg/dL (ref 0.76–1.27)
GFR calc Af Amer: 114 mL/min/{1.73_m2} (ref >59)
GFR calc non Af Amer: 98 mL/min/{1.73_m2} (ref >59)
Globulin, Total: 2.5 g/dL (ref 1.5–4.5)
Glucose: 375 mg/dL — ABNORMAL HIGH (ref 65–99)
Potassium: 4.5 mmol/L (ref 3.5–5.2)
Sodium: 134 mmol/L (ref 134–144)
Total Protein: 6.7 g/dL (ref 6.0–8.5)

## 2019-08-03 LAB — CBC WITH DIFFERENTIAL/PLATELET
Basophils Absolute: 0.1 10*3/uL (ref 0.0–0.2)
Basos: 1 %
EOS (ABSOLUTE): 0.1 10*3/uL (ref 0.0–0.4)
Eos: 1 %
Hematocrit: 45 % (ref 37.5–51.0)
Hemoglobin: 15.3 g/dL (ref 13.0–17.7)
Immature Grans (Abs): 0.1 10*3/uL (ref 0.0–0.1)
Immature Granulocytes: 1 %
Lymphocytes Absolute: 2.9 10*3/uL (ref 0.7–3.1)
Lymphs: 42 %
MCH: 29.5 pg (ref 26.6–33.0)
MCHC: 34 g/dL (ref 31.5–35.7)
MCV: 87 fL (ref 79–97)
Monocytes Absolute: 0.4 10*3/uL (ref 0.1–0.9)
Monocytes: 6 %
Neutrophils Absolute: 3.5 10*3/uL (ref 1.4–7.0)
Neutrophils: 49 %
Platelets: 237 10*3/uL (ref 150–450)
RBC: 5.18 x10E6/uL (ref 4.14–5.80)
RDW: 13 % (ref 11.6–15.4)
WBC: 7 10*3/uL (ref 3.4–10.8)

## 2019-08-03 LAB — T4, FREE: Free T4: 1.16 ng/dL (ref 0.82–1.77)

## 2019-08-03 LAB — LIPID PANEL WITH LDL/HDL RATIO
Cholesterol, Total: 228 mg/dL — ABNORMAL HIGH (ref 100–199)
HDL: 35 mg/dL — ABNORMAL LOW (ref >39)
LDL Chol Calc (NIH): 158 mg/dL — ABNORMAL HIGH (ref 0–99)
LDL/HDL Ratio: 4.5 ratio — ABNORMAL HIGH (ref 0.0–3.6)
Triglycerides: 192 mg/dL — ABNORMAL HIGH (ref 0–149)
VLDL Cholesterol Cal: 35 mg/dL (ref 5–40)

## 2019-08-03 LAB — IRON,TIBC AND FERRITIN PANEL
Ferritin: 291 ng/mL (ref 30–400)
Iron Saturation: 29 % (ref 15–55)
Iron: 85 ug/dL (ref 38–169)
Total Iron Binding Capacity: 290 ug/dL (ref 250–450)
UIBC: 205 ug/dL (ref 111–343)

## 2019-08-03 LAB — TSH: TSH: 3.11 u[IU]/mL (ref 0.450–4.500)

## 2019-08-03 LAB — VITAMIN D 25 HYDROXY (VIT D DEFICIENCY, FRACTURES): Vit D, 25-Hydroxy: 11 ng/mL — ABNORMAL LOW (ref 30.0–100.0)

## 2019-08-05 ENCOUNTER — Other Ambulatory Visit: Payer: Self-pay

## 2019-08-18 ENCOUNTER — Telehealth: Payer: Self-pay

## 2019-08-18 NOTE — Telephone Encounter (Signed)
Confirmed appointment on 08/22/2019 and screened for covid. klh  

## 2019-08-20 ENCOUNTER — Other Ambulatory Visit: Payer: Self-pay | Admitting: Adult Health

## 2019-08-20 DIAGNOSIS — I10 Essential (primary) hypertension: Secondary | ICD-10-CM

## 2019-08-22 ENCOUNTER — Ambulatory Visit: Payer: 59 | Admitting: Adult Health

## 2019-08-22 ENCOUNTER — Other Ambulatory Visit: Payer: Self-pay

## 2019-08-22 ENCOUNTER — Encounter: Payer: Self-pay | Admitting: Adult Health

## 2019-08-22 VITALS — BP 150/88 | HR 73 | Temp 97.5°F | Resp 16 | Ht 75.0 in | Wt 320.6 lb

## 2019-08-22 DIAGNOSIS — E782 Mixed hyperlipidemia: Secondary | ICD-10-CM

## 2019-08-22 DIAGNOSIS — E1165 Type 2 diabetes mellitus with hyperglycemia: Secondary | ICD-10-CM | POA: Diagnosis not present

## 2019-08-22 DIAGNOSIS — M79604 Pain in right leg: Secondary | ICD-10-CM

## 2019-08-22 DIAGNOSIS — K219 Gastro-esophageal reflux disease without esophagitis: Secondary | ICD-10-CM

## 2019-08-22 DIAGNOSIS — I10 Essential (primary) hypertension: Secondary | ICD-10-CM | POA: Diagnosis not present

## 2019-08-22 DIAGNOSIS — Z6841 Body Mass Index (BMI) 40.0 and over, adult: Secondary | ICD-10-CM

## 2019-08-22 LAB — POCT GLYCOSYLATED HEMOGLOBIN (HGB A1C): Hemoglobin A1C: 11.2 % — AB (ref 4.0–5.6)

## 2019-08-22 MED ORDER — TRULICITY 1.5 MG/0.5ML ~~LOC~~ SOAJ: 1.5000 mg | SUBCUTANEOUS | 3 refills | Status: DC

## 2019-08-22 MED ORDER — CANAGLIFLOZIN 300 MG PO TABS: 300.0000 mg | ORAL_TABLET | Freq: Every day | ORAL | 2 refills | Status: DC

## 2019-08-22 NOTE — Progress Notes (Signed)
[facility] [place], [place] [address]  Internal MEDICINE  Office Visit Note  Patient Name: Gerald [name]  [id number]  [id number]  Date of Service: 08/22/2019  Chief Complaint  Patient presents with  . Follow-up  . Diabetes  . Gastroesophageal Reflux  . Hyperlipidemia  . Hypertension  . Leg Pain    right leg pain    HPI  Pt is here for follow up on DM, GERD, HLD, HTN and leg pain. His blood pressure is improving with the addition of 10mg  Amlodipine. Denies Chest pain, Shortness of breath, palpitations, headache, or blurred vision. His GERD is controlled, denies any new or worsening symptoms.  Patient reports 5 days ago he had some right leg pain, which has been intermittent for some time. however, on this day he had swelling from ankle to above his knee.  He also noticed some bluish, purplish discoloration to that leg.  The swelling lasted 5-6 hours, and the discoloration stayed about 4 hours. ABI's in office today.        Current Medication: Outpatient Encounter Medications as of 08/22/2019  Medication Sig  . acetaminophen (TYLENOL) 325 MG tablet Take 325-650 mg by mouth every 6 (six) hours as needed for mild pain or headache.  . albuterol (PROVENTIL HFA;VENTOLIN HFA) 108 (90 Base) MCG/ACT inhaler Inhale 2 puffs into the lungs every 6 (six) hours as needed for wheezing or shortness of breath.  Marland Kitchen amLODipine (NORVASC) 10 MG tablet Take 1 tablet (10 mg total) by mouth daily.  Marland Kitchen amoxicillin (AMOXIL) 500 MG capsule Take 1 capsule (500 mg total) by mouth 2 (two) times daily.  Marland Kitchen aspirin 81 MG EC tablet Take 1 tablet (81 mg total) by mouth daily.  Marland Kitchen azithromycin (ZITHROMAX) 250 MG tablet z-pack - take as directed for 5 days for upper respiratory infection.  . citalopram (CELEXA) 40 MG tablet TAKE 1 TABLET BY MOUTH EVERY DAY FOR GAD  . Dulaglutide (TRULICITY) 1.53 YO/3.7CH SOPN Inject 1.5 mg into the skin every Wednesday.  . empagliflozin (JARDIANCE) 10 MG  TABS tablet Take 10 mg by mouth daily.  . fexofenadine-pseudoephedrine (ALLEGRA-D) 60-120 MG 12 hr tablet Take 1 tablet by mouth 2 (two) times daily.  . fluconazole (DIFLUCAN) 150 MG tablet Take 1 tablet (150 mg total) by mouth every 3 (three) days.  Marland Kitchen guaiFENesin (MUCINEX) 600 MG 12 hr tablet Take 1 tablet (600 mg total) by mouth 2 (two) times daily.  Marland Kitchen ibuprofen (ADVIL) 600 MG tablet Take 1 tablet (600 mg total) by mouth every 8 (eight) hours as needed.  . insulin glargine (LANTUS) 100 UNIT/ML injection Inject 0.62 mLs (62 Units total) into the skin daily.  Marland Kitchen lisinopril-hydrochlorothiazide (ZESTORETIC) 20-25 MG tablet Take 1 tablet by mouth 2 (two) times daily.  . nitroGLYCERIN (NITROSTAT) 0.4 MG SL tablet Place 1 tablet (0.4 mg total) under the tongue every 5 (five) minutes x 3 doses as needed for chest pain.  Marland Kitchen nystatin-triamcinolone ointment (MYCOLOG) Apply 1 application topically 2 (two) times daily.  . pantoprazole (PROTONIX) 40 MG tablet Take 1 tablet (40 mg total) by mouth 2 (two) times daily before a meal.   No facility-administered encounter medications on file as of 08/22/2019.    Surgical History: Past Surgical History:  Procedure Laterality Date  . CARDIAC CATHETERIZATION N/A 09/2011   [facility]; EF 50% with 30% mid LAD stenosis and no obstructive disease.  Marland Kitchen CARDIAC CATHETERIZATION  09/2010   [facility]; Mid LAD 40% stenosis; Mid Circumflex:Normal; Mid RCA; Normal  .  CARDIAC CATHETERIZATION    . COLONOSCOPY    . ESOPHAGOGASTRODUODENOSCOPY (EGD) WITH PROPOFOL N/A 06/10/2017   Procedure: ESOPHAGOGASTRODUODENOSCOPY (EGD) WITH PROPOFOL;  Surgeon: Jonathon Bellows, MD;  Location[facility] ENDOSCOPY;  Service: Gastroenterology;  Laterality: N/A;  . KNEE ARTHROSCOPY WITH MEDIAL MENISECTOMY Right 09/16/2012   Procedure: RIGHT KNEE ARTHROSCOPY WITH MEDIAL AND LATERAL MENISECTOMY, CHONDROPLASTY;  Surgeon: Ninetta Lights, MD;  Location: [facility];  Service: Orthopedics;  Laterality: Right;   RIGHT KNEE SCOPE MEDIAL MENISCECTOMY  . LEFT HEART CATH AND CORONARY ANGIOGRAPHY N/A 06/11/2016   Procedure: Left Heart Cath and Coronary Angiography;  Surgeon: Minna Merritts, MD;  Location: [facility];  Service: Cardiovascular;  Laterality: N/A;    Medical History: Past Medical History:  Diagnosis Date  . Abnormal LFTs   . Carcinoma ([place])   . Diabetes mellitus without complication (HCC)    borderline  . Fundic gland polyps of stomach, benign   . Gastritis   . GERD (gastroesophageal reflux disease)   . Hepatic steatosis   . Hyperlipidemia   . Hypertension   . Nonischemic cardiomyopathy (HCC)    Previous ejection fraction of 35-40% on echo. 50% on cardiac catheterization in August of 2013  . Obesity   . Sleep apnea     Family History: Family History  Problem Relation Age of Onset  . Heart disease Father 24       MI  . Heart attack Father   . Stomach cancer Father   . Hypertension Mother   . Breast cancer Mother     Social History   Socioeconomic History  . Marital status: Married    Spouse name: Not on file  . Number of children: 3  . Years of education: Not on file  . Highest education level: Not on file  Occupational History  . Occupation: Librarian, academic  Tobacco Use  . Smoking status: Never Smoker  . Smokeless tobacco: Never Used  Vaping Use  . Vaping Use: Never used  Substance and Sexual Activity  . Alcohol use: Not Currently    Alcohol/week: 0.0 standard drinks    Comment: rare  . Drug use: No  . Sexual activity: Not on file  Other Topics Concern  . Not on file  Social History Narrative   Married.  71 yo son.   Wife on disability for bipolar disorder.     Social Determinants of Health   Financial Resource Strain:   . Difficulty of Paying Living Expenses:   Food Insecurity:   . Worried About Charity fundraiser in the Last Year:   . Arboriculturist in the Last Year:   Transportation Needs:   . Film/video editor (Medical):    Marland Kitchen Lack of Transportation (Non-Medical):   Physical Activity:   . Days of Exercise per Week:   . Minutes of Exercise per Session:   Stress:   . Feeling of Stress :   Social Connections:   . Frequency of Communication with Friends and Family:   . Frequency of Social Gatherings with Friends and Family:   . Attends Religious Services:   . Active Member of Clubs or Organizations:   . Attends Archivist Meetings:   Marland Kitchen Marital Status:   Intimate Partner Violence:   . Fear of Current or Ex-Partner:   . Emotionally Abused:   Marland Kitchen Physically Abused:   . Sexually Abused:       Review of Systems  Constitutional: Negative.  Negative  for chills, fatigue and unexpected weight change.  HENT: Negative.  Negative for congestion, rhinorrhea, sneezing and sore throat.   Eyes: Negative for redness.  Respiratory: Negative.  Negative for cough, chest tightness and shortness of breath.   Cardiovascular: Negative.  Negative for chest pain and palpitations.  Gastrointestinal: Negative.  Negative for abdominal pain, constipation, diarrhea, nausea and vomiting.  Endocrine: Negative.   Genitourinary: Negative.  Negative for dysuria and frequency.  Musculoskeletal: Negative.  Negative for arthralgias, back pain, joint swelling and neck pain.  Skin: Negative.  Negative for rash.  Allergic/Immunologic: Negative.   Neurological: Negative.  Negative for tremors and numbness.  Hematological: Negative for adenopathy. Does not bruise/bleed easily.  Psychiatric/Behavioral: Negative.  Negative for behavioral problems, sleep disturbance and suicidal ideas. The patient is not nervous/anxious.     Vital Signs: BP (!) 159/82   Pulse 73   Temp (!) 97.5 F (36.4 C)   Resp 16   Ht 6\' 3"  (1.905 m)   Wt (!) 320 lb 9.6 oz (145.4 kg)   SpO2 96%   BMI 40.07 kg/m    Physical Exam Vitals and nursing note reviewed.  Constitutional:      General: He is not in acute distress.    Appearance: He is  well-developed. He is not diaphoretic.  HENT:     Head: Normocephalic and atraumatic.     Mouth/Throat:     Pharynx: No oropharyngeal exudate.  Eyes:     Pupils: Pupils are equal, round, and reactive to light.  Neck:     Thyroid: No thyromegaly.     Vascular: No JVD.     Trachea: No tracheal deviation.  Cardiovascular:     Rate and Rhythm: Normal rate and regular rhythm.     Heart sounds: Normal heart sounds. No murmur heard.  No friction rub. No gallop.   Pulmonary:     Effort: Pulmonary effort is normal. No respiratory distress.     Breath sounds: Normal breath sounds. No wheezing or rales.  Chest:     Chest wall: No tenderness.  Abdominal:     Palpations: Abdomen is soft.     Tenderness: There is no abdominal tenderness. There is no guarding.  Musculoskeletal:        General: Normal range of motion.     Cervical back: Normal range of motion and neck supple.  Lymphadenopathy:     Cervical: No cervical adenopathy.  Skin:    General: Skin is warm and dry.  Neurological:     Mental Status: He is alert and oriented to person, place, and time.     Cranial Nerves: No cranial nerve deficit.  Psychiatric:        Behavior: Behavior normal.        Thought Content: Thought content normal.        Judgment: Judgment normal.    Assessment/Plan: 1. Type 2 diabetes mellitus with hyperglycemia, unspecified whether long term insulin use (HCC) A1C remains elevated today.  Start Invokana, and restart  trulicity.  Continue lantus. Continue with lifestyle modifications, and dietary restrictions.    - POCT HgB A1C - canagliflozin (INVOKANA) 300 MG TABS tablet; Take 1 tablet (300 mg total) by mouth daily before breakfast.  Dispense: 30 tablet; Refill: 2 - Dulaglutide (TRULICITY) 1.5 CZ/6.6AY SOPN; Inject 0.5 mLs (1.5 mg total) into the skin every Wednesday.  Dispense: 4 pen; Refill: 3  2. Pain of right lower extremity ABI in office, get LE [place].  - POCT ABI Screening  Pilot No Charge - [place] UA  Doppler Addl Gest Re Eval; Future  3. Essential hypertension Slightly elevated, continue to deny symptoms, continue to follow up as directed.   4. Mixed hyperlipidemia Continue to monitor.  5. BMI 40.0-44.9, adult (HCC) Obesity Counseling: Risk Assessment: An assessment of behavioral risk factors was made today and includes lack of exercise sedentary lifestyle, lack of portion control and poor dietary habits.  Risk Modification Advice: She was counseled on portion control guidelines. Restricting daily caloric intake to 1600. The detrimental long term effects of obesity on her health and ongoing poor compliance was also discussed with the patient.  6. Morbid obesity ([place]) BMI over 40  7. Gastroesophageal reflux disease without esophagitis Stable, continue current management.   General Counseling: dugan vanhoesen understanding of the findings of todays visit and agrees with plan of treatment. I have discussed any further diagnostic evaluation that may be needed or ordered today. We also reviewed his medications today. he has been encouraged to call the office with any questions or concerns that should arise related to todays visit.    Orders Placed This Encounter  Procedures  . POCT HgB A1C    No orders of the defined types were placed in this encounter.   Time spent:  30 Minutes   This patient was seen by Orson Gear AGNP-C in Collaboration with Dr Lavera Guise as a part of collaborative care agreement     Kendell Bane AGNP-C Internal medicine

## 2019-08-25 ENCOUNTER — Telehealth: Payer: Self-pay

## 2019-08-25 NOTE — Telephone Encounter (Signed)
Tried reaching pt regarding future appointments, unable to reach him and will mail them . Beth

## 2019-08-29 ENCOUNTER — Other Ambulatory Visit: Payer: Self-pay

## 2019-08-29 ENCOUNTER — Ambulatory Visit: Payer: 59 | Admitting: Adult Health

## 2019-08-29 ENCOUNTER — Encounter: Payer: Self-pay | Admitting: Adult Health

## 2019-08-29 VITALS — BP 131/78 | HR 87 | Temp 97.6°F | Resp 16 | Ht 75.0 in | Wt 318.0 lb

## 2019-08-31 ENCOUNTER — Encounter: Payer: Self-pay | Admitting: Adult Health

## 2019-08-31 ENCOUNTER — Other Ambulatory Visit: Payer: Self-pay

## 2019-08-31 ENCOUNTER — Ambulatory Visit: Payer: 59 | Admitting: Adult Health

## 2019-08-31 DIAGNOSIS — G4733 Obstructive sleep apnea (adult) (pediatric): Secondary | ICD-10-CM

## 2019-08-31 DIAGNOSIS — R5383 Other fatigue: Secondary | ICD-10-CM | POA: Diagnosis not present

## 2019-08-31 DIAGNOSIS — I10 Essential (primary) hypertension: Secondary | ICD-10-CM

## 2019-08-31 DIAGNOSIS — R748 Abnormal levels of other serum enzymes: Secondary | ICD-10-CM | POA: Diagnosis not present

## 2019-08-31 DIAGNOSIS — E1165 Type 2 diabetes mellitus with hyperglycemia: Secondary | ICD-10-CM

## 2019-08-31 DIAGNOSIS — Z9989 Dependence on other enabling machines and devices: Secondary | ICD-10-CM

## 2019-09-07 ENCOUNTER — Ambulatory Visit: Payer: 59

## 2019-09-14 ENCOUNTER — Ambulatory Visit: Payer: 59

## 2019-09-17 ENCOUNTER — Other Ambulatory Visit: Payer: Self-pay | Admitting: Adult Health

## 2019-09-17 DIAGNOSIS — I10 Essential (primary) hypertension: Secondary | ICD-10-CM

## 2019-09-18 MED ORDER — VITAMIN D (ERGOCALCIFEROL) 1.25 MG (50000 UNIT) PO CAPS
50000.0000 [IU] | ORAL_CAPSULE | ORAL | 0 refills | Status: DC
Start: 2019-09-18 — End: 2020-05-28

## 2019-09-18 NOTE — Progress Notes (Signed)
[facility] [place], [place] [address]  Internal MEDICINE  Telephone Visit  Patient Name: Gerald [name]  [id number]  [id number]  Date of Service: 09/18/2019  I connected with the patient at  1115 by telephone and verified the patients identity using two identifiers.   I discussed the limitations, risks, security and privacy concerns of performing an evaluation and management service by telephone and the availability of in person appointments. I also discussed with the patient that there may be a patient responsible charge related to the service.  The patient expressed understanding and agrees to proceed.    Chief Complaint  Patient presents with   Telephone Screen   Telephone Assessment   Fatigue    Extreme fatigue; curious about V B12 + wants to discuss labwork    HPI  Pt seen via telephone today to review labs and discuss fatigue.  His most recent labs shows a vit D level at 11, and B12 at 493.  His cholesterol panel is elevated, as well as his glucose.  His AST and ALT are also elevated as well as his alk phos.  He complains of extreme fatigue. Reports he sleeps all night and wakes up exhausted.  He wears cpap every night.  Denies any issues with machine.  He has been wearing it for many years.  Symptoms seem to have increased over the last few weeks.        Current Medication: Outpatient Encounter Medications as of 08/31/2019  Medication Sig   acetaminophen (TYLENOL) 325 MG tablet Take 325-650 mg by mouth every 6 (six) hours as needed for mild pain or headache.   albuterol (PROVENTIL HFA;VENTOLIN HFA) 108 (90 Base) MCG/ACT inhaler Inhale 2 puffs into the lungs every 6 (six) hours as needed for wheezing or shortness of breath.   amLODipine (NORVASC) 10 MG tablet Take 1 tablet (10 mg total) by mouth daily.   amLODipine (NORVASC) 5 MG tablet TAKE 1 TABLET BY MOUTH EVERY DAY   amoxicillin (AMOXIL) 500 MG capsule Take 1 capsule (500 mg total) by  mouth 2 (two) times daily.   aspirin 81 MG EC tablet Take 1 tablet (81 mg total) by mouth daily.   azithromycin (ZITHROMAX) 250 MG tablet z-pack - take as directed for 5 days for upper respiratory infection.   canagliflozin (INVOKANA) 300 MG TABS tablet Take 1 tablet (300 mg total) by mouth daily before breakfast.   citalopram (CELEXA) 40 MG tablet TAKE 1 TABLET BY MOUTH EVERY DAY FOR GAD   Dulaglutide (TRULICITY) 1.5 XT/0.2IO SOPN Inject 0.5 mLs (1.5 mg total) into the skin every Wednesday.   fexofenadine-pseudoephedrine (ALLEGRA-D) 60-120 MG 12 hr tablet Take 1 tablet by mouth 2 (two) times daily.   fluconazole (DIFLUCAN) 150 MG tablet Take 1 tablet (150 mg total) by mouth every 3 (three) days.   guaiFENesin (MUCINEX) 600 MG 12 hr tablet Take 1 tablet (600 mg total) by mouth 2 (two) times daily.   ibuprofen (ADVIL) 600 MG tablet Take 1 tablet (600 mg total) by mouth every 8 (eight) hours as needed.   insulin glargine (LANTUS) 100 UNIT/ML injection Inject 0.62 mLs (62 Units total) into the skin daily.   lisinopril-hydrochlorothiazide (ZESTORETIC) 20-25 MG tablet Take 1 tablet by mouth 2 (two) times daily.   nitroGLYCERIN (NITROSTAT) 0.4 MG SL tablet Place 1 tablet (0.4 mg total) under the tongue every 5 (five) minutes x 3 doses as needed for chest pain.   nystatin-triamcinolone ointment (MYCOLOG) Apply 1 application  topically 2 (two) times daily.   pantoprazole (PROTONIX) 40 MG tablet Take 1 tablet (40 mg total) by mouth 2 (two) times daily before a meal.   Vitamin D, Ergocalciferol, (DRISDOL) 1.25 MG (50000 UNIT) CAPS capsule Take 1 capsule (50,000 Units total) by mouth every 7 (seven) days.   No facility-administered encounter medications on file as of 08/31/2019.    Surgical History: Past Surgical History:  Procedure Laterality Date   CARDIAC CATHETERIZATION N/A 09/2011   [facility]; EF 50% with 30% mid LAD stenosis and no obstructive disease.   CARDIAC CATHETERIZATION  09/2010    [facility]; Mid LAD 40% stenosis; Mid Circumflex:Normal; Mid RCA; Normal   CARDIAC CATHETERIZATION     COLONOSCOPY     ESOPHAGOGASTRODUODENOSCOPY (EGD) WITH PROPOFOL N/A 06/10/2017   Procedure: ESOPHAGOGASTRODUODENOSCOPY (EGD) WITH PROPOFOL;  Surgeon: Jonathon Bellows, MD;  Location: [facility] ENDOSCOPY;  Service: Gastroenterology;  Laterality: N/A;   KNEE ARTHROSCOPY WITH MEDIAL MENISECTOMY Right 09/16/2012   Procedure: RIGHT KNEE ARTHROSCOPY WITH MEDIAL AND LATERAL MENISECTOMY, CHONDROPLASTY;  Surgeon: Ninetta Lights, MD;  Location: [facility];  Service: Orthopedics;  Laterality: Right;  RIGHT KNEE SCOPE MEDIAL MENISCECTOMY   LEFT HEART CATH AND CORONARY ANGIOGRAPHY N/A 06/11/2016   Procedure: Left Heart Cath and Coronary Angiography;  Surgeon: Minna Merritts, MD;  Location: [facility];  Service: Cardiovascular;  Laterality: N/A;    Medical History: Past Medical History:  Diagnosis Date   Abnormal LFTs    Carcinoma ([place])    Diabetes mellitus without complication (HCC)    borderline   Fundic gland polyps of stomach, benign    Gastritis    GERD (gastroesophageal reflux disease)    Hepatic steatosis    Hyperlipidemia    Hypertension    Nonischemic cardiomyopathy (HCC)    Previous ejection fraction of 35-40% on echo. 50% on cardiac catheterization in August of 2013   Obesity    Sleep apnea     Family History: Family History  Problem Relation Age of Onset   Heart disease Father 74       MI   Heart attack Father    Stomach cancer Father    Hypertension Mother    Breast cancer Mother     Social History   Socioeconomic History   Marital status: Married    Spouse name: Not on file   Number of children: 3   Years of education: Not on file   Highest education level: Not on file  Occupational History   Occupation: Librarian, academic  Tobacco Use   Smoking status: Never Smoker   Smokeless tobacco: Never Used  Scientific laboratory technician Use:  Never used  Substance and Sexual Activity   Alcohol use: Not Currently    Alcohol/week: 0.0 standard drinks    Comment: rare   Drug use: No   Sexual activity: Not on file  Other Topics Concern   Not on file  Social History Narrative   Married.  55 yo son.   Wife on disability for bipolar disorder.     Social Determinants of Health   Financial Resource Strain:    Difficulty of Paying Living Expenses:   Food Insecurity:    Worried About Charity fundraiser in the Last Year:    Arboriculturist in the Last Year:   Transportation Needs:    Film/video editor (Medical):    Lack of Transportation (Non-Medical):   Physical Activity:    Days of Exercise per  Week:    Minutes of Exercise per Session:   Stress:    Feeling of Stress :   Social Connections:    Frequency of Communication with Friends and Family:    Frequency of Social Gatherings with Friends and Family:    Attends Religious Services:    Active Member of Clubs or Organizations:    Attends Music therapist:    Marital Status:   Intimate Partner Violence:    Fear of Current or Ex-Partner:    Emotionally Abused:    Physically Abused:    Sexually Abused:       Review of Systems  Vital Signs: There were no vitals taken for this visit.   Observation/Objective:  Well sounding, nad noted.    Assessment/Plan: 1. OSA on CPAP Continue to wear cpap, we discussed possibly having a titration study done to see if his machine settings need to be adjusted.   2. Other fatigue Take B12 PO, Vit D Rx sent to pharmacy.   3. Essential hypertension Continue current mgmt.   4. Elevated liver enzymes Discussed repeating labs at future visit, and possibly abdominal US. Pt would rather recheck labs in future.   General Counseling: aurther harlin understanding of the findings of today's phone visit and agrees with plan of treatment. I have discussed any further diagnostic evaluation  that may be needed or ordered today. We also reviewed his medications today. he has been encouraged to call the office with any questions or concerns that should arise related to todays visit.    No orders of the defined types were placed in this encounter.   Meds ordered this encounter  Medications   Vitamin D, Ergocalciferol, (DRISDOL) 1.25 MG (50000 UNIT) CAPS capsule    Sig: Take 1 capsule (50,000 Units total) by mouth every 7 (seven) days.    Dispense:  7 capsule    Refill:  0    Time spent:* [place] AGNP-C Internal medicine

## 2019-09-30 ENCOUNTER — Other Ambulatory Visit: Payer: 59

## 2019-10-04 ENCOUNTER — Other Ambulatory Visit: Payer: Self-pay

## 2019-10-04 DIAGNOSIS — I10 Essential (primary) hypertension: Secondary | ICD-10-CM

## 2019-10-04 MED ORDER — AMLODIPINE BESYLATE 5 MG PO TABS: 5.0000 mg | ORAL_TABLET | Freq: Every day | ORAL | 0 refills | Status: DC

## 2019-10-05 ENCOUNTER — Ambulatory Visit: Payer: 59 | Admitting: Hospice and Palliative Medicine

## 2019-10-21 IMAGING — CT CT HEAD W/O CM
3 of 4 series · 16 of 47 positions shown, 19 images · non-contrast
Comparison: CT head 10/18/2015

CLINICAL DATA: Headache left-sided numbness.  Neuro deficit

EXAM:
CT HEAD WITHOUT CONTRAST
TECHNIQUE: Contiguous axial images were obtained from the base of the skull
through the vertex without intravenous contrast.

[Series 4: head 2.0 h70h · axial · 0.45mm/px · z∈[-122,+28]mm · 10 of 85 slices shown, 13 images]
[im 5/85  brain]
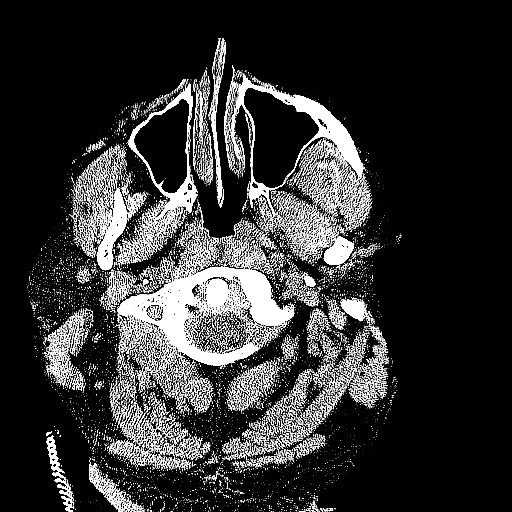
[im 5/85  bone]
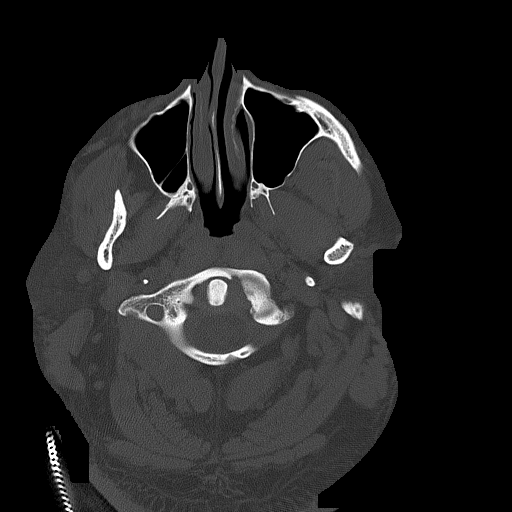
[im 13/85  brain]
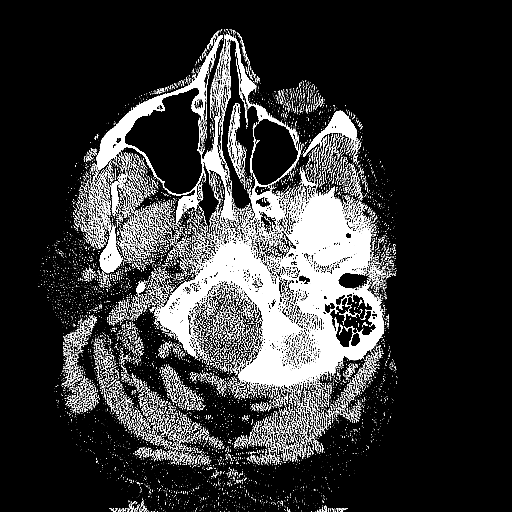
[im 22/85  brain]
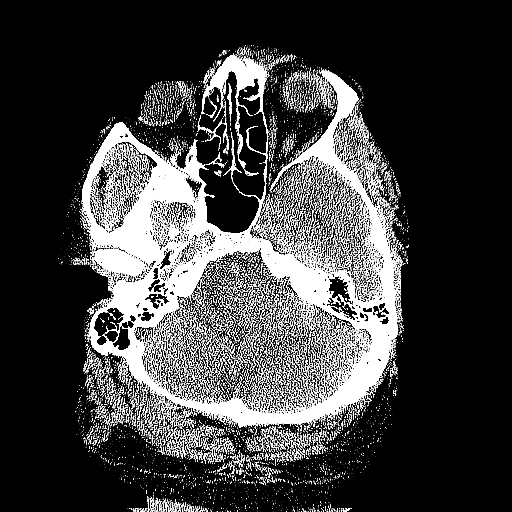
[im 30/85  brain]
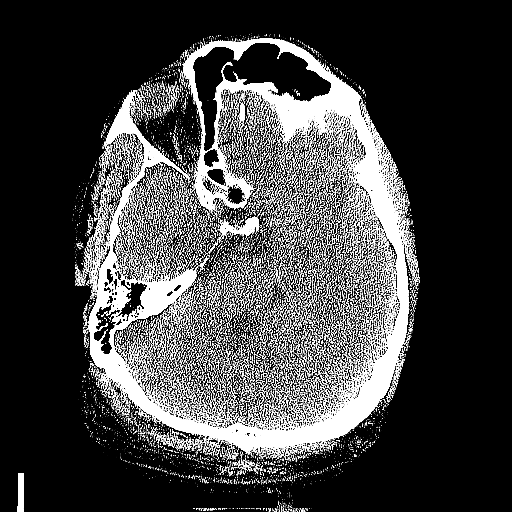
[im 38/85  brain]
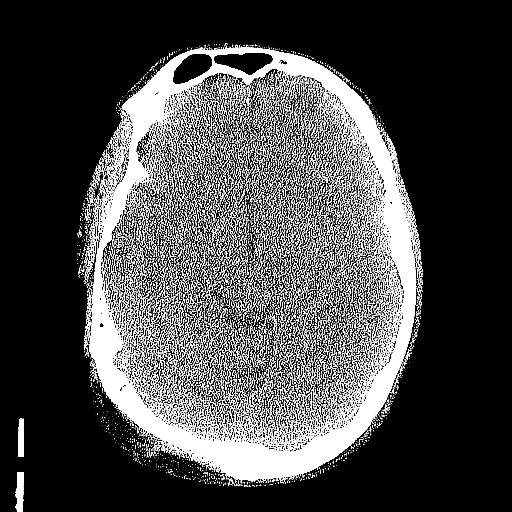
[im 38/85  bone]
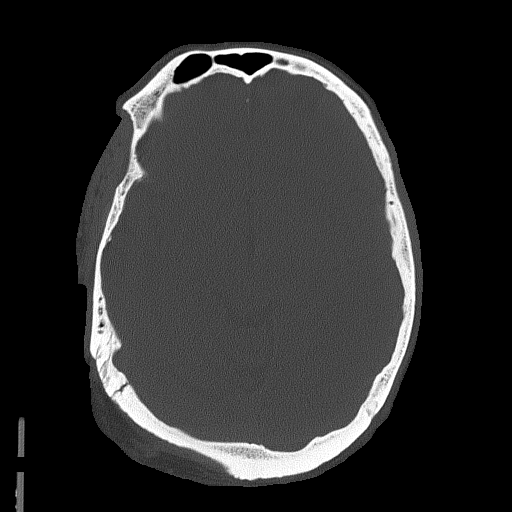
[im 47/85  brain]
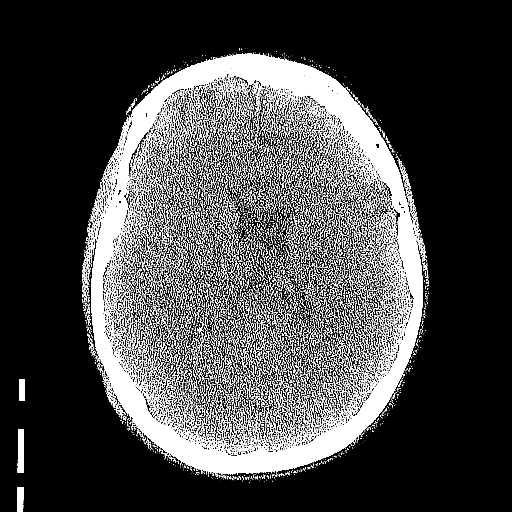
[im 55/85  brain]
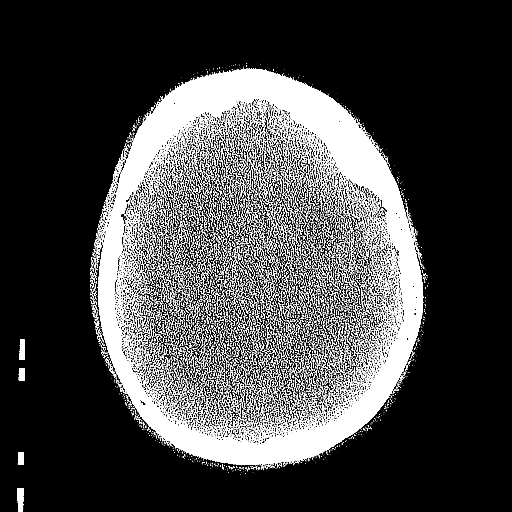
[im 64/85  brain]
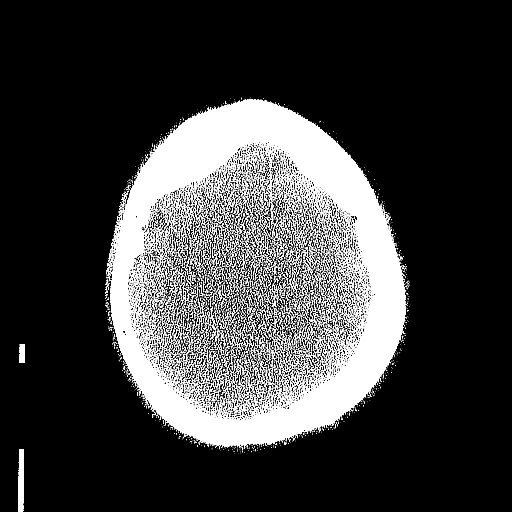
[im 72/85  brain]
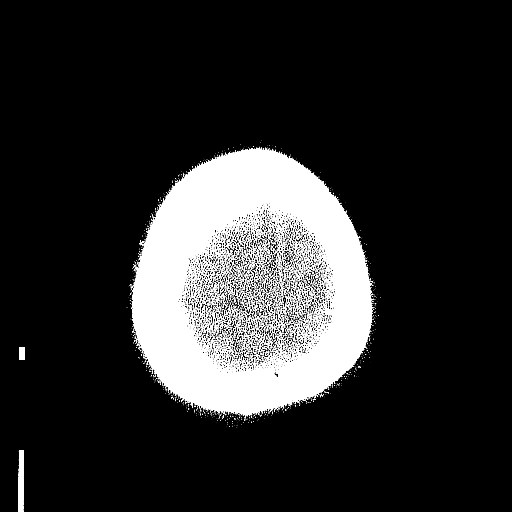
[im 72/85  bone]
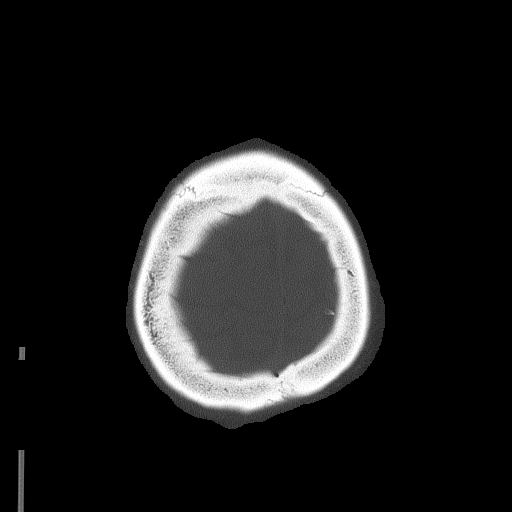
[im 80/85  brain]
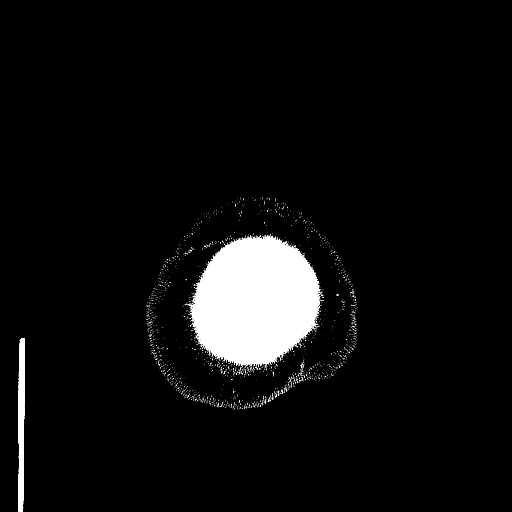

[Series 5: head 3.0 mpr cor · coronal · 0.36mm/px · 3 of 70 slices shown]
[im 24/70  brain]
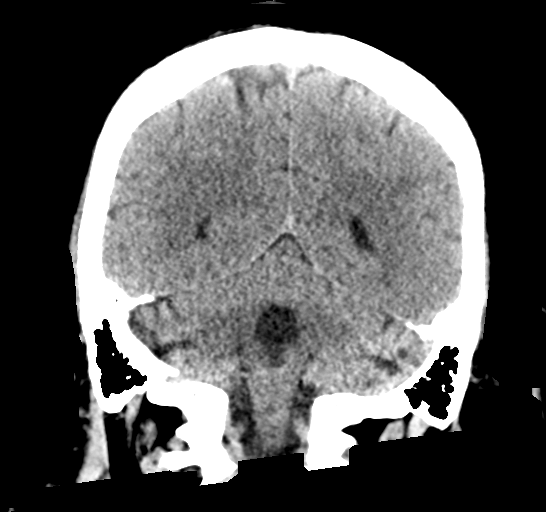
[im 31/70  brain]
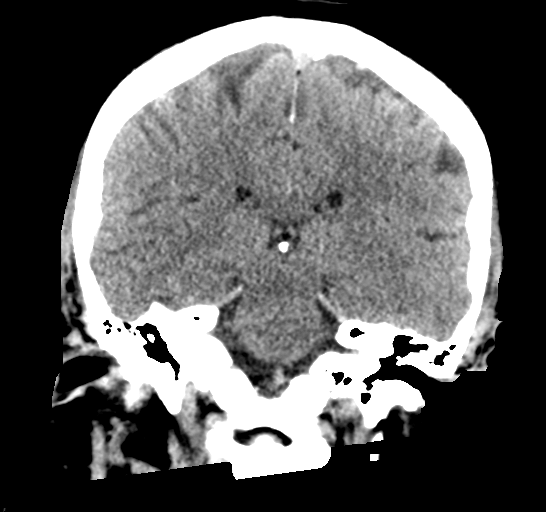
[im 39/70  brain]
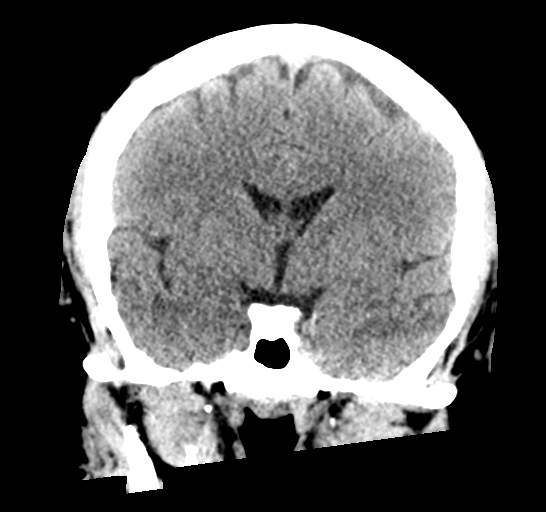

[Series 6: head 3.0 mpr sag · sagittal · 0.33mm/px · 3 of 60 slices shown]
[im 23/60  brain]
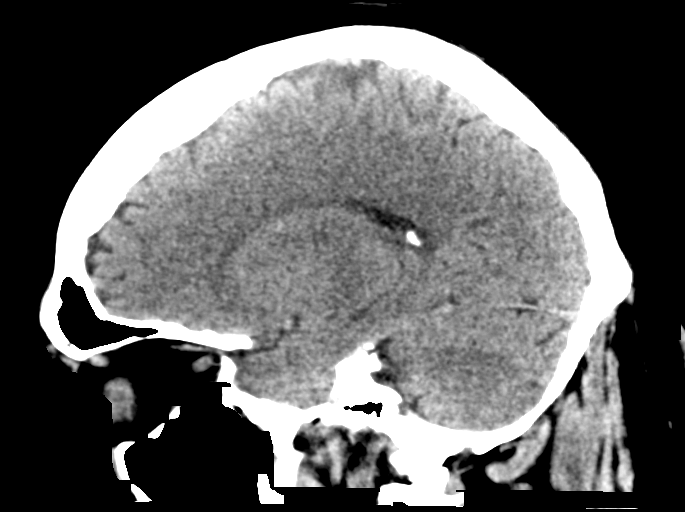
[im 30/60  brain]
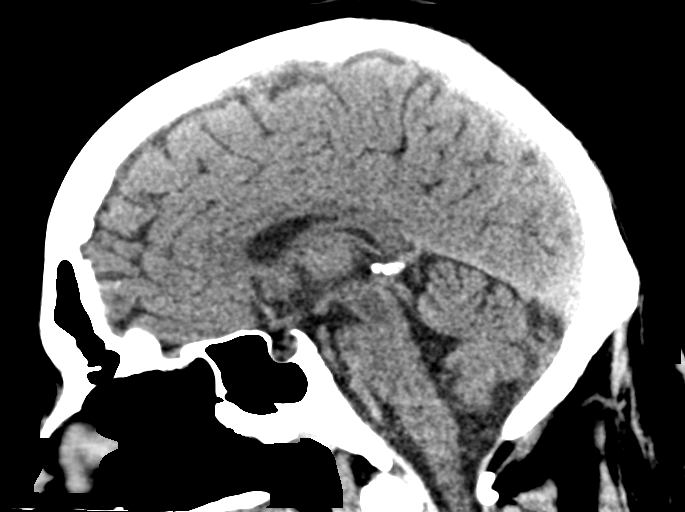
[im 38/60  brain]
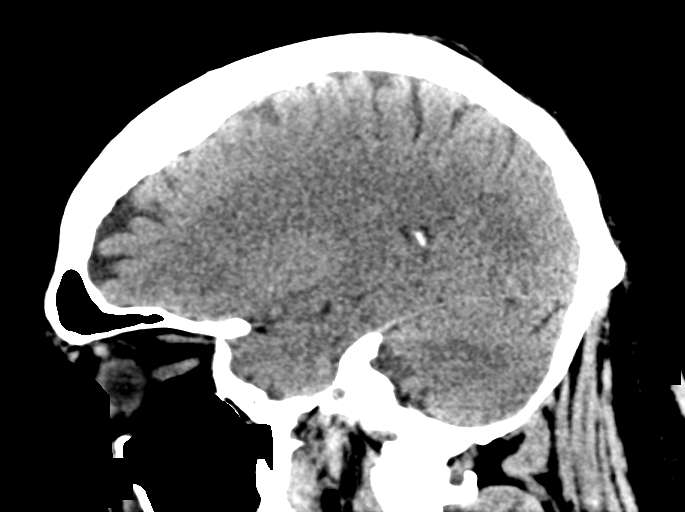

[16 of 47 positions shown; findings below may reference images not displayed]

FINDINGS: Brain: No evidence of acute infarction, hemorrhage, hydrocephalus,
extra-axial collection or mass lesion/mass effect.

Vascular: Negative for hyperdense vessel

Skull: Negative

Sinuses/Orbits: Mild mucosal edema paranasal sinuses.  Normal orbit

Other: None
IMPRESSION: Negative CT head

## 2019-10-24 ENCOUNTER — Other Ambulatory Visit: Payer: Self-pay

## 2019-11-04 ENCOUNTER — Other Ambulatory Visit: Payer: 59

## 2019-11-07 DIAGNOSIS — M7541 Impingement syndrome of right shoulder: Secondary | ICD-10-CM | POA: Insufficient documentation

## 2019-11-10 ENCOUNTER — Ambulatory Visit: Payer: 59 | Admitting: Hospice and Palliative Medicine

## 2019-11-16 ENCOUNTER — Other Ambulatory Visit: Payer: Self-pay

## 2019-11-16 ENCOUNTER — Ambulatory Visit: Payer: 59

## 2019-11-16 DIAGNOSIS — M79604 Pain in right leg: Secondary | ICD-10-CM | POA: Diagnosis not present

## 2019-11-23 ENCOUNTER — Encounter: Payer: Self-pay | Admitting: Hospice and Palliative Medicine

## 2019-11-23 ENCOUNTER — Other Ambulatory Visit: Payer: Self-pay

## 2019-11-23 ENCOUNTER — Ambulatory Visit: Payer: 59 | Admitting: Hospice and Palliative Medicine

## 2019-11-23 VITALS — BP 138/74 | HR 84 | Temp 98.3°F | Resp 16 | Ht 74.0 in | Wt 329.6 lb

## 2019-11-23 DIAGNOSIS — G5793 Unspecified mononeuropathy of bilateral lower limbs: Secondary | ICD-10-CM | POA: Diagnosis not present

## 2019-11-23 DIAGNOSIS — G4733 Obstructive sleep apnea (adult) (pediatric): Secondary | ICD-10-CM

## 2019-11-23 DIAGNOSIS — E1165 Type 2 diabetes mellitus with hyperglycemia: Secondary | ICD-10-CM

## 2019-11-23 LAB — POCT GLYCOSYLATED HEMOGLOBIN (HGB A1C): Hemoglobin A1C: 8.7 % — AB (ref 4.0–5.6)

## 2019-11-23 MED ORDER — GABAPENTIN 100 MG PO CAPS: 100.0000 mg | ORAL_CAPSULE | Freq: Three times a day (TID) | ORAL | 3 refills | Status: DC

## 2019-11-23 NOTE — Progress Notes (Signed)
[facility] [place], [place] [address]  Internal MEDICINE  Office Visit Note  Patient Name: Gerald [name]  [id number]  [id number]  Date of Service: 11/26/2019  Chief Complaint  Patient presents with  . Follow-up    when pt gets up he feels old and moves slow, this is depressing to pt, reviewing [place]  . Diabetes  . Hypertension  . Hyperlipidemia  . Sleep Apnea    eye exam, foot exam    HPI Patient is here for routine follow-up DM-unable to afford invokana, tries to eat healthy, has not been taking invokana since last visit due to finances He is working night shift, his sleep quality is poor--he is works four 12 hour shifts a week He does have OSA--has currently lost his CPAP He complains today of having pain in his legs and feet--most prominent in the mornings after he has been working all night on his feet-also associated with numbness and tingling, does at times interrupt his sleep due to pain Scheduled to have annual eye exam next month Foot exam completed today  Current Medication: Outpatient Encounter Medications as of 11/23/2019  Medication Sig  . acetaminophen (TYLENOL) 325 MG tablet Take 325-650 mg by mouth every 6 (six) hours as needed for mild pain or headache.  Marland Kitchen amLODipine (NORVASC) 5 MG tablet Take 1 tablet (5 mg total) by mouth daily.  . citalopram (CELEXA) 40 MG tablet TAKE 1 TABLET BY MOUTH EVERY DAY FOR GAD  . Dulaglutide (TRULICITY) 1.5 QB/3.4LP SOPN Inject 0.5 mLs (1.5 mg total) into the skin every Wednesday.  . insulin glargine (LANTUS) 100 UNIT/ML injection Inject 0.62 mLs (62 Units total) into the skin daily.  Marland Kitchen lisinopril-hydrochlorothiazide (ZESTORETIC) 20-25 MG tablet Take 1 tablet by mouth 2 (two) times daily.  . nitroGLYCERIN (NITROSTAT) 0.4 MG SL tablet Place 1 tablet (0.4 mg total) under the tongue every 5 (five) minutes x 3 doses as needed for chest pain.  . Vitamin D, Ergocalciferol, (DRISDOL) 1.25 MG (50000 UNIT) CAPS  capsule Take 1 capsule (50,000 Units total) by mouth every 7 (seven) days.  . [DISCONTINUED] canagliflozin (INVOKANA) 300 MG TABS tablet Take 1 tablet (300 mg total) by mouth daily before breakfast.  . gabapentin (NEURONTIN) 100 MG capsule Take 1 capsule (100 mg total) by mouth 3 (three) times daily. Take 1 tablet in PM and 2 tablets in AM.  . [DISCONTINUED] albuterol (PROVENTIL HFA;VENTOLIN HFA) 108 (90 Base) MCG/ACT inhaler Inhale 2 puffs into the lungs every 6 (six) hours as needed for wheezing or shortness of breath. (Patient not taking: Reported on 11/23/2019)  . [DISCONTINUED] amLODipine (NORVASC) 10 MG tablet Take 1 tablet (10 mg total) by mouth daily. (Patient not taking: Reported on 11/23/2019)  . [DISCONTINUED] amoxicillin (AMOXIL) 500 MG capsule Take 1 capsule (500 mg total) by mouth 2 (two) times daily. (Patient not taking: Reported on 11/23/2019)  . [DISCONTINUED] aspirin 81 MG EC tablet Take 1 tablet (81 mg total) by mouth daily. (Patient not taking: Reported on 11/23/2019)  . [DISCONTINUED] azithromycin (ZITHROMAX) 250 MG tablet z-pack - take as directed for 5 days for upper respiratory infection. (Patient not taking: Reported on 11/23/2019)  . [DISCONTINUED] fexofenadine-pseudoephedrine (ALLEGRA-D) 60-120 MG 12 hr tablet Take 1 tablet by mouth 2 (two) times daily. (Patient not taking: Reported on 11/23/2019)  . [DISCONTINUED] fluconazole (DIFLUCAN) 150 MG tablet Take 1 tablet (150 mg total) by mouth every 3 (three) days. (Patient not taking: Reported on 11/23/2019)  . [DISCONTINUED] guaiFENesin ([place])  600 MG 12 hr tablet Take 1 tablet (600 mg total) by mouth 2 (two) times daily. (Patient not taking: Reported on 11/23/2019)  . [DISCONTINUED] ibuprofen (ADVIL) 600 MG tablet Take 1 tablet (600 mg total) by mouth every 8 (eight) hours as needed. (Patient not taking: Reported on 11/23/2019)  . [DISCONTINUED] nystatin-triamcinolone ointment (MYCOLOG) Apply 1 application topically 2 (two)  times daily. (Patient not taking: Reported on 11/23/2019)  . [DISCONTINUED] pantoprazole (PROTONIX) 40 MG tablet Take 1 tablet (40 mg total) by mouth 2 (two) times daily before a meal. (Patient not taking: Reported on 11/23/2019)   No facility-administered encounter medications on file as of 11/23/2019.    Surgical History: Past Surgical History:  Procedure Laterality Date  . CARDIAC CATHETERIZATION N/A 09/2011   [facility]; EF 50% with 30% mid LAD stenosis and no obstructive disease.  Marland Kitchen CARDIAC CATHETERIZATION  09/2010   [facility]; Mid LAD 40% stenosis; Mid Circumflex:Normal; Mid RCA; Normal  . CARDIAC CATHETERIZATION    . COLONOSCOPY    . ESOPHAGOGASTRODUODENOSCOPY (EGD) WITH PROPOFOL N/A 06/10/2017   Procedure: ESOPHAGOGASTRODUODENOSCOPY (EGD) WITH PROPOFOL;  Surgeon: Jonathon Bellows, MD;  Location: [facility] ENDOSCOPY;  Service: Gastroenterology;  Laterality: N/A;  . KNEE ARTHROSCOPY WITH MEDIAL MENISECTOMY Right 09/16/2012   Procedure: RIGHT KNEE ARTHROSCOPY WITH MEDIAL AND LATERAL MENISECTOMY, CHONDROPLASTY;  Surgeon: Ninetta Lights, MD;  Location: [facility];  Service: Orthopedics;  Laterality: Right;  RIGHT KNEE SCOPE MEDIAL MENISCECTOMY  . LEFT HEART CATH AND CORONARY ANGIOGRAPHY N/A 06/11/2016   Procedure: Left Heart Cath and Coronary Angiography;  Surgeon: Minna Merritts, MD;  Location: [facility];  Service: Cardiovascular;  Laterality: N/A;    Medical History: Past Medical History:  Diagnosis Date  . Abnormal LFTs   . Carcinoma ([place])   . Diabetes mellitus without complication (HCC)    borderline  . Fundic gland polyps of stomach, benign   . Gastritis   . GERD (gastroesophageal reflux disease)   . Hepatic steatosis   . Hyperlipidemia   . Hypertension   . Nonischemic cardiomyopathy (HCC)    Previous ejection fraction of 35-40% on echo. 50% on cardiac catheterization in August of 2013  . Obesity   . Sleep apnea     Family History: Family History  Problem  Relation Age of Onset  . Heart disease Father 60       MI  . Heart attack Father   . Stomach cancer Father   . Hypertension Mother   . Breast cancer Mother     Social History   Socioeconomic History  . Marital status: Married    Spouse name: Not on file  . Number of children: 3  . Years of education: Not on file  . Highest education level: Not on file  Occupational History  . Occupation: Librarian, academic  Tobacco Use  . Smoking status: Never Smoker  . Smokeless tobacco: Never Used  Vaping Use  . Vaping Use: Never used  Substance and Sexual Activity  . Alcohol use: Not Currently    Alcohol/week: 0.0 standard drinks    Comment: rare  . Drug use: No  . Sexual activity: Not on file  Other Topics Concern  . Not on file  Social History Narrative   Married.  73 yo son.   Wife on disability for bipolar disorder.     Social Determinants of Health   Financial Resource Strain:   . Difficulty of Paying Living Expenses: Not on file  Food Insecurity:   .  Worried About Charity fundraiser in the Last Year: Not on file  . Ran Out of Food in the Last Year: Not on file  Transportation Needs:   . Lack of Transportation (Medical): Not on file  . Lack of Transportation (Non-Medical): Not on file  Physical Activity:   . Days of Exercise per Week: Not on file  . Minutes of Exercise per Session: Not on file  Stress:   . Feeling of Stress : Not on file  Social Connections:   . Frequency of Communication with Friends and Family: Not on file  . Frequency of Social Gatherings with Friends and Family: Not on file  . Attends Religious Services: Not on file  . Active Member of Clubs or Organizations: Not on file  . Attends Archivist Meetings: Not on file  . Marital Status: Not on file  Intimate Partner Violence:   . Fear of Current or Ex-Partner: Not on file  . Emotionally Abused: Not on file  . Physically Abused: Not on file  . Sexually Abused: Not on file      Review  of Systems  Constitutional: Positive for fatigue. Negative for chills and unexpected weight change.  HENT: Negative for congestion, postnasal drip, rhinorrhea, sneezing and sore throat.   Eyes: Negative for photophobia, redness and visual disturbance.  Respiratory: Negative for cough, chest tightness and shortness of breath.   Cardiovascular: Negative for chest pain, palpitations and leg swelling.  Gastrointestinal: Negative for abdominal pain, constipation, diarrhea, nausea and vomiting.  Genitourinary: Negative for dysuria and frequency.  Musculoskeletal: Negative for arthralgias, back pain, joint swelling and neck pain.       Pain in bilateral feet, numbness and tingling in mornings  Skin: Negative for rash.  Neurological: Negative for dizziness, tremors, weakness, light-headedness, numbness and headaches.  Hematological: Negative for adenopathy. Does not bruise/bleed easily.  Psychiatric/Behavioral: Negative for behavioral problems (Depression), sleep disturbance and suicidal ideas. The patient is not nervous/anxious.     Vital Signs: BP 138/74   Pulse 84   Temp 98.3 F (36.8 C)   Resp 16   Ht 6\' 2"  (1.88 m)   Wt (!) 329 lb 9.6 oz (149.5 kg)   SpO2 98%   BMI 42.32 kg/m    Physical Exam Vitals reviewed.  Constitutional:      Appearance: Normal appearance. He is obese.  Cardiovascular:     Rate and Rhythm: Normal rate and regular rhythm.     Pulses: Normal pulses.     Heart sounds: Normal heart sounds.  Pulmonary:     Effort: Pulmonary effort is normal.     Breath sounds: Normal breath sounds.  Musculoskeletal:        General: Normal range of motion.  Skin:    General: Skin is warm.  Neurological:     General: No focal deficit present.     Mental Status: He is alert and oriented to person, place, and time. Mental status is at baseline.  Psychiatric:        Mood and Affect: Mood normal.        Behavior: Behavior normal.        Thought Content: Thought content  normal.    Assessment/Plan: 1. OSA (obstructive sleep apnea) Has not been using his CPAP for several months--lost his CPAP during recent move Will order new CPAP machine - For home use only DME continuous positive airway pressure (CPAP)  2. Type 2 diabetes mellitus with hyperglycemia, unspecified whether long term insulin  use ([place]) A1C 8.7 today, major improvement from last check at 11.2, will continue with current therapy at this time, advised to work on improving diet May consider adjust therapy at next visit - POCT HgB A1C  3. Neuropathy involving both lower extremities Will start low dose gabapentin for lower extremity neuropathic pain--will assess response to therapy at next follow-up visit - gabapentin (NEURONTIN) 100 MG capsule; Take 1 capsule (100 mg total) by mouth 3 (three) times daily. Take 1 tablet in PM and 2 tablets in AM.  Dispense: 90 capsule; Refill: 3  General Counseling: Rendell verbalizes understanding of the findings of todays visit and agrees with plan of treatment. I have discussed any further diagnostic evaluation that may be needed or ordered today. We also reviewed his medications today. he has been encouraged to call the office with any questions or concerns that should arise related to todays visit.    Orders Placed This Encounter  Procedures  . For home use only DME continuous positive airway pressure (CPAP)  . POCT HgB A1C    Meds ordered this encounter  Medications  . gabapentin (NEURONTIN) 100 MG capsule    Sig: Take 1 capsule (100 mg total) by mouth 3 (three) times daily. Take 1 tablet in PM and 2 tablets in AM.    Dispense:  90 capsule    Refill:  3    Time spent: 30 Minutes Time spent includes review of chart, medications, test results and follow-up plan with the patient.  This patient was seen by Theodoro Grist AGNP-C in Collaboration with Dr Lavera Guise as a part of collaborative care agreement     Tanna Furry. Curtisha Bendix AGNP-C Internal  medicine

## 2019-11-26 ENCOUNTER — Encounter: Payer: Self-pay | Admitting: Hospice and Palliative Medicine

## 2019-12-06 ENCOUNTER — Telehealth: Payer: Self-pay

## 2019-12-06 NOTE — Telephone Encounter (Signed)
Orders for cpap set up are pending auth and [email or web address] will call him once its been approved. Gerald [name]

## 2020-01-25 ENCOUNTER — Encounter: Payer: Self-pay | Admitting: Hospice and Palliative Medicine

## 2020-01-25 ENCOUNTER — Other Ambulatory Visit: Payer: Self-pay

## 2020-01-25 ENCOUNTER — Ambulatory Visit: Payer: 59 | Admitting: Hospice and Palliative Medicine

## 2020-01-25 VITALS — BP 136/86 | HR 95 | Temp 97.5°F | Resp 16 | Ht 74.0 in | Wt 333.2 lb

## 2020-01-25 DIAGNOSIS — L0292 Furuncle, unspecified: Secondary | ICD-10-CM

## 2020-01-25 MED ORDER — DOXYCYCLINE HYCLATE 100 MG PO CAPS: 100.0000 mg | ORAL_CAPSULE | Freq: Two times a day (BID) | ORAL | 0 refills | Status: DC

## 2020-01-25 NOTE — Progress Notes (Signed)
[facility] [place], [place] [address]  Internal MEDICINE  Office Visit Note  Patient Name: Gerald [name]  [id number]  [id number]  Date of Service: 02/03/2020  Chief Complaint  Patient presents with  . Acute Visit    Knot in chest on left side, discolored and sore, under breast, family history or breast cancer  . Hypertension  . Hyperlipidemia  . Gastroesophageal Reflux  . Sleep Apnea  . Diabetes  . Quality Metric Gaps    Pneumovax, eye exam     HPI Pt is here for a sick visit. Complaining today of a bump under his left breast, first noticed bump about a week ago, since then bump has enlarged, has become very painful and red Explained this appears to be a boil, as there is an apparent collection of substance under skin with an apparent white and yellowish tip He describes the boil being very painful, disrupting his sleep as he is typically a side sleeper, also tender when he is wearing certain clothing Discussed options of in office lance and drainage of boil for immediate relief of pain, will need further management with dermatology Or discussed being seen by urgent care for management and possible drainage  He wished to proceed with in office lance and drainage He is aware this will be a superficial lance with minimal drainage, boil will refill with puss requiring further treatment by dermatology Due to upcoming holiday he wished to proceed  Verbal consent obtained for minor lance and drainage of boil under left breast--Desha, CMA present in room for consent as well as procedure Will attempt to collect sample for culture  Current Medication:  Outpatient Encounter Medications as of 01/25/2020  Medication Sig  . acetaminophen (TYLENOL) 325 MG tablet Take 325-650 mg by mouth every 6 (six) hours as needed for mild pain or headache.  Marland Kitchen amLODipine (NORVASC) 5 MG tablet Take 1 tablet (5 mg total) by mouth daily.  . citalopram (CELEXA) 40 MG tablet  TAKE 1 TABLET BY MOUTH EVERY DAY FOR GAD  . doxycycline (VIBRAMYCIN) 100 MG capsule Take 1 capsule (100 mg total) by mouth 2 (two) times daily.  . Dulaglutide (TRULICITY) 1.5 0000000 SOPN Inject 0.5 mLs (1.5 mg total) into the skin every Wednesday.  . gabapentin (NEURONTIN) 100 MG capsule Take 1 capsule (100 mg total) by mouth 3 (three) times daily. Take 1 tablet in PM and 2 tablets in AM.  . insulin glargine (LANTUS) 100 UNIT/ML injection Inject 0.62 mLs (62 Units total) into the skin daily.  Marland Kitchen lisinopril-hydrochlorothiazide (ZESTORETIC) 20-25 MG tablet Take 1 tablet by mouth 2 (two) times daily.  . nitroGLYCERIN (NITROSTAT) 0.4 MG SL tablet Place 1 tablet (0.4 mg total) under the tongue every 5 (five) minutes x 3 doses as needed for chest pain.  . Vitamin D, Ergocalciferol, (DRISDOL) 1.25 MG (50000 UNIT) CAPS capsule Take 1 capsule (50,000 Units total) by mouth every 7 (seven) days.   No facility-administered encounter medications on file as of 01/25/2020.      Medical History: Past Medical History:  Diagnosis Date  . Abnormal LFTs   . Carcinoma ([place])   . Diabetes mellitus without complication (HCC)    borderline  . Fundic gland polyps of stomach, benign   . Gastritis   . GERD (gastroesophageal reflux disease)   . Hepatic steatosis   . Hyperlipidemia   . Hypertension   . Nonischemic cardiomyopathy (HCC)    Previous ejection fraction of 35-40% on echo. 50% on  cardiac catheterization in August of 2013  . Obesity   . Sleep apnea      Vital Signs: BP 136/86   Pulse 95   Temp (!) 97.5 F (36.4 C)   Resp 16   Ht 6\' 2"  (1.88 m)   Wt (!) 333 lb 3.2 oz (151.1 kg)   SpO2 97%   BMI 42.78 kg/m    Review of Systems  Constitutional: Negative for chills, fatigue and unexpected weight change.  HENT: Negative for congestion, postnasal drip, rhinorrhea, sneezing and sore throat.   Eyes: Negative for redness.  Respiratory: Negative for cough, chest tightness and shortness of  breath.   Cardiovascular: Negative for chest pain and palpitations.  Gastrointestinal: Negative for abdominal pain, constipation, diarrhea, nausea and vomiting.  Genitourinary: Negative for dysuria and frequency.  Musculoskeletal: Negative for arthralgias, back pain, joint swelling and neck pain.  Skin: Negative for rash.       Painful bump with redness under left breast  Neurological: Negative for tremors and numbness.  Hematological: Negative for adenopathy. Does not bruise/bleed easily.  Psychiatric/Behavioral: Negative for behavioral problems (Depression), sleep disturbance and suicidal ideas. The patient is not nervous/anxious.     Physical Exam Cardiovascular:     Rate and Rhythm: Normal rate and regular rhythm.     Pulses: Normal pulses.     Heart sounds: Normal heart sounds.  Pulmonary:     Effort: Pulmonary effort is normal.     Breath sounds: Normal breath sounds.  Skin:         Comments: Localized boil under left breast, about 4 cm in diameter, yellowish tip in center if boil,surround tissue erythematous, tender to palpation     Assessment/Plan: 1. Boil Procedure: Skin prepped with alcohol as well as antibacterial skin wash 2 mL lidocaine injected into surrounding tissue 0.5 cm incision made to center of boil Proceeded with drainage Unable to obtain sample for culture Area covered with 4X4 guaze and skin tape, advised to change dressing twice per day Advised to contact office with worsening pain, redness or fevers Start doxycycline for prophylaxis Referral placed for dermatology for further evaluation and management - Ambulatory referral to Dermatology  General Counseling: Shirl Elfreda Blanchet understanding of the findings of todays visit and agrees with plan of treatment. I have discussed any further diagnostic evaluation that may be needed or ordered today. We also reviewed his medications today. he has been encouraged to call the office with any questions or  concerns that should arise related to todays visit.   Orders Placed This Encounter  Procedures  . Ambulatory referral to Dermatology    Meds ordered this encounter  Medications  . doxycycline (VIBRAMYCIN) 100 MG capsule    Sig: Take 1 capsule (100 mg total) by mouth 2 (two) times daily.    Dispense:  20 capsule    Refill:  0    Time spent: 30 Minutes Time spent includes review of chart, medications, test results and follow-up plan with the patient.  This patient was seen by Theodoro Grist AGNP-C in Collaboration with Dr Lavera Guise as a part of collaborative care agreement.  Tanna Furry [facility] Internal Medicine

## 2020-01-26 ENCOUNTER — Telehealth: Payer: Self-pay

## 2020-01-30 ENCOUNTER — Ambulatory Visit: Payer: 59 | Admitting: Hospice and Palliative Medicine

## 2020-01-30 ENCOUNTER — Telehealth: Payer: Self-pay

## 2020-01-30 NOTE — Telephone Encounter (Signed)
Called to check on pt due to him having to cancel his appt for today 01/30/20 bc of vomiting.  LMOM for pt to call us if he needed anything.

## 2020-01-30 NOTE — Telephone Encounter (Signed)
pt called

## 2020-02-03 ENCOUNTER — Encounter: Payer: Self-pay | Admitting: Hospice and Palliative Medicine

## 2020-02-06 ENCOUNTER — Ambulatory Visit: Payer: 59 | Admitting: Hospice and Palliative Medicine

## 2020-02-23 ENCOUNTER — Other Ambulatory Visit: Payer: Self-pay

## 2020-02-23 ENCOUNTER — Ambulatory Visit: Payer: 59 | Admitting: Hospice and Palliative Medicine

## 2020-02-23 ENCOUNTER — Encounter: Payer: Self-pay | Admitting: Hospice and Palliative Medicine

## 2020-02-23 VITALS — BP 138/88 | HR 90 | Temp 97.3°F | Resp 16 | Ht 75.0 in | Wt 331.0 lb

## 2020-02-23 DIAGNOSIS — G5793 Unspecified mononeuropathy of bilateral lower limbs: Secondary | ICD-10-CM

## 2020-02-23 DIAGNOSIS — Z6841 Body Mass Index (BMI) 40.0 and over, adult: Secondary | ICD-10-CM

## 2020-02-23 DIAGNOSIS — E1165 Type 2 diabetes mellitus with hyperglycemia: Secondary | ICD-10-CM | POA: Diagnosis not present

## 2020-02-23 DIAGNOSIS — I10 Essential (primary) hypertension: Secondary | ICD-10-CM | POA: Diagnosis not present

## 2020-02-23 DIAGNOSIS — E782 Mixed hyperlipidemia: Secondary | ICD-10-CM | POA: Diagnosis not present

## 2020-02-23 LAB — POCT GLYCOSYLATED HEMOGLOBIN (HGB A1C): Hemoglobin A1C: 9.1 % — AB (ref 4.0–5.6)

## 2020-02-23 MED ORDER — LISINOPRIL-HYDROCHLOROTHIAZIDE 20-25 MG PO TABS
1.0000 | ORAL_TABLET | Freq: Two times a day (BID) | ORAL | 3 refills | Status: DC
Start: 2020-02-23 — End: 2020-05-28

## 2020-02-23 MED ORDER — GABAPENTIN 100 MG PO CAPS: 100.0000 mg | ORAL_CAPSULE | Freq: Three times a day (TID) | ORAL | 3 refills | Status: DC

## 2020-02-23 MED ORDER — AMLODIPINE BESYLATE 5 MG PO TABS: 5.0000 mg | ORAL_TABLET | Freq: Every day | ORAL | 0 refills | Status: DC

## 2020-02-23 NOTE — Progress Notes (Addendum)
[facility] [place], [place] [address]  Internal MEDICINE  Office Visit Note  Patient Name: Gerald [name]  [id number]  [id number]  Date of Service: 03/01/2020  Chief Complaint  Patient presents with   Follow-up    Wart tore off now black left upper deltoid area   Diabetes   Gastroesophageal Reflux   Sleep Apnea   Hypertension   Hyperlipidemia   Quality Metric Gaps    Eye exam, covid vaccine done    HPI Patient is here for routine follow-up He and his wife have recently started a weight loss journey, they have joined a gym and have been working out every day and they have also changed their diet--avoiding eating out as much and less fried foods--noted 2 pound weight loss Still without CPAP at night due to losing machine--replacement has been ordered DM-with lifestyle changes he has noticed an improvement in his glucose readings, currently on Trulicity 1.5 mg weekly as well as Lantus 62 units daily He has not yet had his annual eye exam for this year He is scheduled to see dermatology later this month--several abnormal appearing moles, left inframammary abscess drained in office last month healed--no recurring abscess Reviewed recent labs-abnormal lipid panel--unable to tolerate statin therapy in the past     Current Medication: Outpatient Encounter Medications as of 02/23/2020  Medication Sig   acetaminophen (TYLENOL) 325 MG tablet Take 325-650 mg by mouth every 6 (six) hours as needed for mild pain or headache.   amLODipine (NORVASC) 5 MG tablet Take 1 tablet (5 mg total) by mouth daily.   citalopram (CELEXA) 40 MG tablet TAKE 1 TABLET BY MOUTH EVERY DAY FOR GAD   Dulaglutide (TRULICITY) 1.5 VO/3.5KK SOPN Inject 0.5 mLs (1.5 mg total) into the skin every Wednesday.   gabapentin (NEURONTIN) 100 MG capsule Take 1 capsule (100 mg total) by mouth 3 (three) times daily. Take 1 tablet in PM and 2 tablets in AM.   insulin glargine (LANTUS) 100 UNIT/ML  injection Inject 0.62 mLs (62 Units total) into the skin daily.   lisinopril-hydrochlorothiazide (ZESTORETIC) 20-25 MG tablet Take 1 tablet by mouth 2 (two) times daily.   nitroGLYCERIN (NITROSTAT) 0.4 MG SL tablet Place 1 tablet (0.4 mg total) under the tongue every 5 (five) minutes x 3 doses as needed for chest pain.   Vitamin D, Ergocalciferol, (DRISDOL) 1.25 MG (50000 UNIT) CAPS capsule Take 1 capsule (50,000 Units total) by mouth every 7 (seven) days.   [DISCONTINUED] amLODipine (NORVASC) 5 MG tablet Take 1 tablet (5 mg total) by mouth daily.   [DISCONTINUED] doxycycline (VIBRAMYCIN) 100 MG capsule Take 1 capsule (100 mg total) by mouth 2 (two) times daily.   [DISCONTINUED] gabapentin (NEURONTIN) 100 MG capsule Take 1 capsule (100 mg total) by mouth 3 (three) times daily. Take 1 tablet in PM and 2 tablets in AM.   [DISCONTINUED] lisinopril-hydrochlorothiazide (ZESTORETIC) 20-25 MG tablet Take 1 tablet by mouth 2 (two) times daily.   No facility-administered encounter medications on file as of 02/23/2020.    Surgical History: Past Surgical History:  Procedure Laterality Date   CARDIAC CATHETERIZATION N/A 09/2011   [facility]; EF 50% with 30% mid LAD stenosis and no obstructive disease.   CARDIAC CATHETERIZATION  09/2010   [facility]; Mid LAD 40% stenosis; Mid Circumflex:Normal; Mid RCA; Normal   CARDIAC CATHETERIZATION     COLONOSCOPY     ESOPHAGOGASTRODUODENOSCOPY (EGD) WITH PROPOFOL N/A 06/10/2017   Procedure: ESOPHAGOGASTRODUODENOSCOPY (EGD) WITH PROPOFOL;  Surgeon: Jonathon Bellows,  MD;  Location: [facility] ENDOSCOPY;  Service: Gastroenterology;  Laterality: N/A;   KNEE ARTHROSCOPY WITH MEDIAL MENISECTOMY Right 09/16/2012   Procedure: RIGHT KNEE ARTHROSCOPY WITH MEDIAL AND LATERAL MENISECTOMY, CHONDROPLASTY;  Surgeon: Ninetta Lights, MD;  Location: [place];  Service: Orthopedics;  Laterality: Right;  RIGHT KNEE SCOPE MEDIAL MENISCECTOMY   LEFT HEART CATH AND CORONARY ANGIOGRAPHY N/A 06/11/2016    Procedure: Left Heart Cath and Coronary Angiography;  Surgeon: Minna Merritts, MD;  Location: [place] [facility];  Service: Cardiovascular;  Laterality: N/A;    Medical History: Past Medical History:  Diagnosis Date   Abnormal LFTs    Carcinoma ([place])    Diabetes mellitus without complication (HCC)    borderline   Fundic gland polyps of stomach, benign    Gastritis    GERD (gastroesophageal reflux disease)    Hepatic steatosis    Hyperlipidemia    Hypertension    Nonischemic cardiomyopathy (HCC)    Previous ejection fraction of 35-40% on echo. 50% on cardiac catheterization in August of 2013   Obesity    Sleep apnea     Family History: Family History  Problem Relation Age of Onset   Heart disease Father 45       MI   Heart attack Father    Stomach cancer Father    Hypertension Mother    Breast cancer Mother     Social History   Socioeconomic History   Marital status: Married    Spouse name: Not on file   Number of children: 3   Years of education: Not on file   Highest education level: Not on file  Occupational History   Occupation: Librarian, academic  Tobacco Use   Smoking status: Never Smoker   Smokeless tobacco: Never Used  Scientific laboratory technician Use: Never used  Substance and Sexual Activity   Alcohol use: Not Currently    Alcohol/week: 0.0 standard drinks    Comment: rare   Drug use: No   Sexual activity: Not on file  Other Topics Concern   Not on file  Social History Narrative   Married.  82 yo son.   Wife on disability for bipolar disorder.     Social Determinants of Health   Financial Resource Strain: Not on file  Food Insecurity: Not on file  Transportation Needs: Not on file  Physical Activity: Not on file  Stress: Not on file  Social Connections: Not on file  Intimate Partner Violence: Not on file      Review of Systems  Constitutional: Negative for chills, fatigue and unexpected weight change.  HENT: Negative for congestion,  postnasal drip, rhinorrhea, sneezing and sore throat.   Eyes: Negative for redness.  Respiratory: Negative for cough, chest tightness and shortness of breath.   Cardiovascular: Negative for chest pain and palpitations.  Gastrointestinal: Negative for abdominal pain, constipation, diarrhea, nausea and vomiting.  Genitourinary: Negative for dysuria and frequency.  Musculoskeletal: Negative for arthralgias, back pain, joint swelling and neck pain.  Skin: Negative for rash.  Neurological: Negative for tremors and numbness.  Hematological: Negative for adenopathy. Does not bruise/bleed easily.  Psychiatric/Behavioral: Negative for behavioral problems (Depression), sleep disturbance and suicidal ideas. The patient is not nervous/anxious.     Vital Signs: BP 138/88   Pulse 90   Temp (!) 97.3 F (36.3 C)   Resp 16   Ht 6\' 3"  (1.905 m)   Wt (!) 331 lb (150.1 kg)   SpO2 99%  BMI 41.37 kg/m    Physical Exam Vitals reviewed.  Constitutional:      Appearance: Normal appearance. He is obese.  Cardiovascular:     Rate and Rhythm: Normal rate and regular rhythm.     Pulses: Normal pulses.     Heart sounds: Normal heart sounds.  Pulmonary:     Effort: Pulmonary effort is normal.     Breath sounds: Normal breath sounds.  Abdominal:     General: Abdomen is flat.     Palpations: Abdomen is soft.  Musculoskeletal:        General: Normal range of motion.     Cervical back: Normal range of motion.  Skin:    General: Skin is warm.  Neurological:     General: No focal deficit present.     Mental Status: He is alert and oriented to person, place, and time. Mental status is at baseline.  Psychiatric:        Mood and Affect: Mood normal.        Behavior: Behavior normal.        Thought Content: Thought content normal.        Judgment: Judgment normal.    Assessment/Plan: 1. Type 2 diabetes mellitus with hyperglycemia, unspecified whether long term insulin use (HCC) A1C 9.1--add Januvia  100 mg daily--samples given in office today for 30 day supple to help regulate glucose levels and added weight loss benefit Discussed A1C is 3 month average of glucose readings, recently started lifestyle modifications a few weeks prior and to not get discouraged - POCT HgB A1C - Ambulatory referral to Ophthalmology  2. Neuropathy involving both lower extremities Stable at this time, requesting refills - gabapentin (NEURONTIN) 100 MG capsule; Take 1 capsule (100 mg total) by mouth 3 (three) times daily. Take 1 tablet in PM and 2 tablets in AM.  Dispense: 90 capsule; Refill: 3  3. Essential hypertension BP and HR well controlled today--requesting refills - lisinopril-hydrochlorothiazide (ZESTORETIC) 20-25 MG tablet; Take 1 tablet by mouth 2 (two) times daily.  Dispense: 90 tablet; Refill: 3 - amLODipine (NORVASC) 5 MG tablet; Take 1 tablet (5 mg total) by mouth daily.  Dispense: 90 tablet; Refill: 0  4. Mixed hyperlipidemia Abnormal lipid panel on labs in June--will need updated labs, consider non-statin therapy and vascular studies  5. Morbid obesity (HCC) BMI 41.3  6. BMI 40.0-44.9, adult [facility]) Praised for recent lifestyle modifications Continue with healthy diet and exercise Obesity Counseling: Risk Assessment: An assessment of behavioral risk factors was made today and includes lack of exercise sedentary lifestyle, lack of portion control and poor dietary habits.  Risk Modification Advice: She was counseled on portion control guidelines. Restricting daily caloric intake to 1800. The detrimental long term effects of obesity on her health and ongoing poor compliance was also discussed with the patient.  General Counseling: teague nelli understanding of the findings of todays visit and agrees with plan of treatment. I have discussed any further diagnostic evaluation that may be needed or ordered today. We also reviewed his medications today. he has been encouraged to call the office  with any questions or concerns that should arise related to todays visit.    Orders Placed This Encounter  Procedures   Ambulatory referral to Ophthalmology   POCT HgB A1C    Meds ordered this encounter  Medications   gabapentin (NEURONTIN) 100 MG capsule    Sig: Take 1 capsule (100 mg total) by mouth 3 (three) times daily. Take 1 tablet in  PM and 2 tablets in AM.    Dispense:  90 capsule    Refill:  3   lisinopril-hydrochlorothiazide (ZESTORETIC) 20-25 MG tablet    Sig: Take 1 tablet by mouth 2 (two) times daily.    Dispense:  90 tablet    Refill:  3   amLODipine (NORVASC) 5 MG tablet    Sig: Take 1 tablet (5 mg total) by mouth daily.    Dispense:  90 tablet    Refill:  0    Time spent: 30 Minutes Time spent includes review of chart, medications, test results and follow-up plan with the patient.  This patient was seen by Theodoro Grist AGNP-C in Collaboration with Dr Lavera Guise as a part of collaborative care agreement     Tanna Furry. Braileigh Landenberger AGNP-C Internal medicine

## 2020-02-26 ENCOUNTER — Other Ambulatory Visit: Payer: Self-pay

## 2020-02-27 ENCOUNTER — Other Ambulatory Visit: Payer: Self-pay

## 2020-02-27 ENCOUNTER — Ambulatory Visit: Payer: 59 | Admitting: Dermatology

## 2020-02-27 DIAGNOSIS — L918 Other hypertrophic disorders of the skin: Secondary | ICD-10-CM

## 2020-02-27 DIAGNOSIS — L814 Other melanin hyperpigmentation: Secondary | ICD-10-CM | POA: Diagnosis not present

## 2020-02-27 DIAGNOSIS — Z9889 Other specified postprocedural states: Secondary | ICD-10-CM

## 2020-02-27 DIAGNOSIS — D1800 Hemangioma unspecified site: Secondary | ICD-10-CM

## 2020-02-27 DIAGNOSIS — Z1283 Encounter for screening for malignant neoplasm of skin: Secondary | ICD-10-CM

## 2020-02-27 DIAGNOSIS — C4492 Squamous cell carcinoma of skin, unspecified: Secondary | ICD-10-CM

## 2020-02-27 DIAGNOSIS — D485 Neoplasm of uncertain behavior of skin: Secondary | ICD-10-CM

## 2020-02-27 DIAGNOSIS — C44629 Squamous cell carcinoma of skin of left upper limb, including shoulder: Secondary | ICD-10-CM | POA: Diagnosis not present

## 2020-02-27 DIAGNOSIS — L578 Other skin changes due to chronic exposure to nonionizing radiation: Secondary | ICD-10-CM

## 2020-02-27 DIAGNOSIS — L821 Other seborrheic keratosis: Secondary | ICD-10-CM

## 2020-02-27 DIAGNOSIS — D229 Melanocytic nevi, unspecified: Secondary | ICD-10-CM

## 2020-02-27 HISTORY — DX: Squamous cell carcinoma of skin, unspecified: C44.92

## 2020-02-27 NOTE — Progress Notes (Unsigned)
New Patient Visit  Subjective  Gerald [name] is a 55 y.o. male who presents for the following: lesion (L chest - has been there for months was treated with I&D and prescribed antibiotics at [facility]) and Lesion (L arm x 6 mths - patient accidentally hit it on a wall and tore part of it off, but there is still some remaining. ). Patient would also like his upper body checked today.  The following portions of the chart were reviewed this encounter and updated as appropriate:   Tobacco  Allergies  Meds  Problems  Med Hx  Surg Hx  Fam Hx     Review of Systems:  No other skin or systemic complaints except as noted in HPI or Assessment and Plan.  Objective  Well appearing patient in no apparent distress; mood and affect are within normal limits.  All skin waist up examined.  Objective  L distal lat deltoid: Crusted hyperkeratotic papule 1.1 cm   Objective  Left Inframammary Fold: Clear today    Assessment & Plan  Neoplasm of uncertain behavior of skin L distal lat deltoid  Epidermal / dermal shaving  Lesion diameter (cm):  1.1 Informed consent: discussed and consent obtained   Timeout: patient name, date of birth, surgical site, and procedure verified   Procedure prep:  Patient was prepped and draped in usual sterile fashion Prep type:  Isopropyl alcohol Anesthesia: the lesion was anesthetized in a standard fashion   Anesthetic:  1% lidocaine w/ epinephrine 1-100,000 buffered w/ 8.4% NaHCO3 Instrument used: flexible razor blade   Hemostasis achieved with: pressure, aluminum chloride and electrodesiccation   Outcome: patient tolerated procedure well   Post-procedure details: sterile dressing applied and wound care instructions given   Dressing type: bandage and petrolatum    Destruction of lesion Complexity: extensive   Destruction method: electrodesiccation and curettage   Informed consent: discussed and consent obtained   Timeout:  patient name,  date of birth, surgical site, and procedure verified Procedure prep:  Patient was prepped and draped in usual sterile fashion Prep type:  Isopropyl alcohol Anesthesia: the lesion was anesthetized in a standard fashion   Anesthetic:  1% lidocaine w/ epinephrine 1-100,000 buffered w/ 8.4% NaHCO3 Curettage performed in three different directions: Yes   Electrodesiccation performed over the curetted area: Yes   Lesion length (cm):  1.1 Lesion width (cm):  1.1 Margin per side (cm):  0.2 Final wound size (cm):  1.5 Hemostasis achieved with:  pressure, aluminum chloride and electrodesiccation Outcome: patient tolerated procedure well with no complications   Post-procedure details: sterile dressing applied and wound care instructions given   Dressing type: bandage and petrolatum    Specimen 1 - Surgical pathology Differential Diagnosis: D48.5 ISK vs wart vs SCC  ED&C today  Check Margins: No Crusted hyperkeratotic papule  History of drainage of abscess Left Inframammary Fold  Hx of ruptured abscess - RTC PRN recurrence   Skin cancer screening  SCC (squamous cell carcinoma), shoulder, left  Lentigines - Scattered tan macules - Discussed due to sun exposure - Benign, observe - Call for any changes  Seborrheic Keratoses - Stuck-on, waxy, tan-brown papules and plaques  - Discussed benign etiology and prognosis. - Observe - Call for any changes  Melanocytic Nevi - Tan-brown and/or pink-flesh-colored symmetric macules and papules - Benign appearing on exam today - Observation - Call clinic for new or changing moles - Recommend daily use of broad spectrum spf 30+ sunscreen to sun-exposed areas.  Hemangiomas - Red papules - Discussed benign nature - Observe - Call for any changes  Actinic Damage - Chronic, secondary to cumulative UV/sun exposure - diffuse scaly erythematous macules with underlying dyspigmentation - Recommend daily broad spectrum sunscreen SPF 30+ to  sun-exposed areas, reapply every 2 hours as needed.  - Call for new or changing lesions.  Acrochordons (Skin Tags) - Fleshy, skin-colored pedunculated papules - Benign appearing.  - Observe. - If desired, they can be removed with an in office procedure that is not covered by insurance. - Please call the clinic if you notice any new or changing lesions.  Return if symptoms worsen or fail to improve.  Luther Redo, CMA, am acting as scribe for Sarina Ser, MD .  Documentation: I have reviewed the above documentation for accuracy and completeness, and I agree with the above.  Sarina Ser, MD

## 2020-02-27 NOTE — Patient Instructions (Signed)
Wound Care Instructions  1. Cleanse wound gently with soap and water once a day then pat dry with clean gauze. Apply a thing coat of Petrolatum (petroleum jelly, "Vaseline") over the wound (unless you have an allergy to this). We recommend that you use a new, sterile tube of Vaseline. Do not pick or remove scabs. Do not remove the yellow or white "healing tissue" from the base of the wound.  2. Cover the wound with fresh, clean, nonstick gauze and secure with paper tape. You may use Band-Aids in place of gauze and tape if the would is small enough, but would recommend trimming much of the tape off as there is often too much. Sometimes Band-Aids can irritate the skin.  3. You should call the office for your biopsy report after 1 week if you have not already been contacted.  4. If you experience any problems, such as abnormal amounts of bleeding, swelling, significant bruising, significant pain, or evidence of infection, please call the office immediately.  5. FOR ADULT SURGERY PATIENTS: If you need something for pain relief you may take 1 extra strength Tylenol (acetaminophen) AND 2 Ibuprofen (200mg each) together every 4 hours as needed for pain. (do not take these if you are allergic to them or if you have a reason you should not take them.) Typically, you may only need pain medication for 1 to 3 days.   Wound Care Instructions  6. Cleanse wound gently with soap and water once a day then pat dry with clean gauze. Apply a thing coat of Petrolatum (petroleum jelly, "Vaseline") over the wound (unless you have an allergy to this). We recommend that you use a new, sterile tube of Vaseline. Do not pick or remove scabs. Do not remove the yellow or white "healing tissue" from the base of the wound.  7. Cover the wound with fresh, clean, nonstick gauze and secure with paper tape. You may use Band-Aids in place of gauze and tape if the would is small enough, but would recommend trimming much of the tape off  as there is often too much. Sometimes Band-Aids can irritate the skin.  8. You should call the office for your biopsy report after 1 week if you have not already been contacted.  9. If you experience any problems, such as abnormal amounts of bleeding, swelling, significant bruising, significant pain, or evidence of infection, please call the office immediately.  10. FOR ADULT SURGERY PATIENTS: If you need something for pain relief you may take 1 extra strength Tylenol (acetaminophen) AND 2 Ibuprofen (200mg each) together every 4 hours as needed for pain. (do not take these if you are allergic to them or if you have a reason you should not take them.) Typically, you may only need pain medication for 1 to 3 days.      

## 2020-03-01 ENCOUNTER — Encounter: Payer: Self-pay | Admitting: Dermatology

## 2020-03-01 ENCOUNTER — Encounter: Payer: Self-pay | Admitting: Hospice and Palliative Medicine

## 2020-03-02 ENCOUNTER — Other Ambulatory Visit: Payer: Self-pay

## 2020-03-02 DIAGNOSIS — F411 Generalized anxiety disorder: Secondary | ICD-10-CM

## 2020-03-02 MED ORDER — CITALOPRAM HYDROBROMIDE 40 MG PO TABS: ORAL_TABLET | ORAL | 1 refills | Status: DC

## 2020-03-02 NOTE — Addendum Note (Signed)
Addended by: Ralene Bathe on: 03/02/2020 01:23 PM   Modules accepted: Level of Service

## 2020-03-05 ENCOUNTER — Telehealth: Payer: Self-pay

## 2020-03-05 NOTE — Telephone Encounter (Signed)
-----   Message from Ralene Bathe, MD sent at 02/29/2020  6:20 PM EST ----- Diagnosis Skin , left distal lat deltoid WELL DIFFERENTIATED SQUAMOUS CELL CARCINOMA  Cancer - SCC Already treated Recheck next visit

## 2020-03-05 NOTE — Telephone Encounter (Signed)
Advised patient of results/hd  

## 2020-05-28 ENCOUNTER — Ambulatory Visit: Payer: 59 | Admitting: Hospice and Palliative Medicine

## 2020-05-28 ENCOUNTER — Other Ambulatory Visit: Payer: Self-pay

## 2020-05-28 ENCOUNTER — Encounter: Payer: Self-pay | Admitting: Hospice and Palliative Medicine

## 2020-05-28 VITALS — BP 134/86 | HR 74 | Temp 97.6°F | Resp 16 | Ht 74.0 in | Wt 307.6 lb

## 2020-05-28 DIAGNOSIS — F411 Generalized anxiety disorder: Secondary | ICD-10-CM

## 2020-05-28 DIAGNOSIS — I10 Essential (primary) hypertension: Secondary | ICD-10-CM | POA: Diagnosis not present

## 2020-05-28 DIAGNOSIS — E1165 Type 2 diabetes mellitus with hyperglycemia: Secondary | ICD-10-CM | POA: Diagnosis not present

## 2020-05-28 DIAGNOSIS — G5793 Unspecified mononeuropathy of bilateral lower limbs: Secondary | ICD-10-CM | POA: Diagnosis not present

## 2020-05-28 DIAGNOSIS — R5383 Other fatigue: Secondary | ICD-10-CM

## 2020-05-28 LAB — POCT GLYCOSYLATED HEMOGLOBIN (HGB A1C): Hemoglobin A1C: 6.2 % — AB (ref 4.0–5.6)

## 2020-05-28 MED ORDER — CITALOPRAM HYDROBROMIDE 40 MG PO TABS: ORAL_TABLET | ORAL | 1 refills | Status: DC

## 2020-05-28 MED ORDER — GABAPENTIN 100 MG PO CAPS: 200.0000 mg | ORAL_CAPSULE | Freq: Three times a day (TID) | ORAL | 2 refills | Status: DC

## 2020-05-28 MED ORDER — LISINOPRIL-HYDROCHLOROTHIAZIDE 20-25 MG PO TABS: 1.0000 | ORAL_TABLET | Freq: Every day | ORAL | 1 refills | Status: DC

## 2020-05-28 NOTE — Progress Notes (Signed)
[facility] [place], [place] [address]  Internal MEDICINE  Office Visit Note  Patient Name: Gerald [name]  [id number]  [id number]  Date of Service: 06/04/2020  Chief Complaint  Patient presents with  . Follow-up    Diabetic nerve damage on feet, refill request   . Hyperlipidemia  . Hypertension  . Gastroesophageal Reflux  . Sleep Apnea  . Diabetes  . Quality Metric Gaps    Eye exam scheduled, pneumovax    HPI Patient is here for routine follow-up Has drastically changed his diet and lifestyle--has stopped taking all diabetic medicines about 2 months ago Home glucose readings have been well controlled, averaging low 100's fasting Has been working out in the gym about 5 days a week Feeling well and much improved since changing modifying his lifestyle Noted 24 pound weight loss since his last visit Having increased neuropathy in his feet since he has been working out Has been taking gabapentin 2 pills twice daily--does not take midday dosing and feels medicine wears off prior to evening dosing Goal weight is 240  Has been on statin therapy in the past and has been unable to tolerate due to negative side effects  Current Medication: Outpatient Encounter Medications as of 05/28/2020  Medication Sig  . acetaminophen (TYLENOL) 325 MG tablet Take 325-650 mg by mouth every 6 (six) hours as needed for mild pain or headache.  . gabapentin (NEURONTIN) 100 MG capsule Take 2 capsules (200 mg total) by mouth 3 (three) times daily.  . nitroGLYCERIN (NITROSTAT) 0.4 MG SL tablet Place 1 tablet (0.4 mg total) under the tongue every 5 (five) minutes x 3 doses as needed for chest pain.  . [DISCONTINUED] amLODipine (NORVASC) 5 MG tablet Take 1 tablet (5 mg total) by mouth daily.  . [DISCONTINUED] citalopram (CELEXA) 40 MG tablet TAKE 1 TABLET BY MOUTH EVERY DAY FOR GAD  . [DISCONTINUED] gabapentin (NEURONTIN) 100 MG capsule Take 1 capsule (100 mg total) by mouth 3  (three) times daily. Take 1 tablet in PM and 2 tablets in AM.  . [DISCONTINUED] insulin glargine (LANTUS) 100 UNIT/ML injection Inject 0.62 mLs (62 Units total) into the skin daily.  . [DISCONTINUED] lisinopril-hydrochlorothiazide (ZESTORETIC) 20-25 MG tablet Take 1 tablet by mouth 2 (two) times daily.  . [DISCONTINUED] Vitamin D, Ergocalciferol, (DRISDOL) 1.25 MG (50000 UNIT) CAPS capsule Take 1 capsule (50,000 Units total) by mouth every 7 (seven) days.  . citalopram (CELEXA) 40 MG tablet TAKE 1 TABLET BY MOUTH EVERY DAY FOR GAD  . lisinopril-hydrochlorothiazide (ZESTORETIC) 20-25 MG tablet Take 1 tablet by mouth daily.  . [DISCONTINUED] Dulaglutide (TRULICITY) 1.5 RK/2.7CW SOPN Inject 0.5 mLs (1.5 mg total) into the skin every Wednesday. (Patient not taking: Reported on 05/28/2020)   No facility-administered encounter medications on file as of 05/28/2020.    Surgical History: Past Surgical History:  Procedure Laterality Date  . CARDIAC CATHETERIZATION N/A 09/2011   [facility]; EF 50% with 30% mid LAD stenosis and no obstructive disease.  Marland Kitchen CARDIAC CATHETERIZATION  09/2010   [facility]; Mid LAD 40% stenosis; Mid Circumflex:Normal; Mid RCA; Normal  . CARDIAC CATHETERIZATION    . COLONOSCOPY    . ESOPHAGOGASTRODUODENOSCOPY (EGD) WITH PROPOFOL N/A 06/10/2017   Procedure: ESOPHAGOGASTRODUODENOSCOPY (EGD) WITH PROPOFOL;  Surgeon: Jonathon Bellows, MD;  Location: [facility] ENDOSCOPY;  Service: Gastroenterology;  Laterality: N/A;  . KNEE ARTHROSCOPY WITH MEDIAL MENISECTOMY Right 09/16/2012   Procedure: RIGHT KNEE ARTHROSCOPY WITH MEDIAL AND LATERAL MENISECTOMY, CHONDROPLASTY;  Surgeon: Ninetta Lights, MD;  Location: [place];  Service: Orthopedics;  Laterality: Right;  RIGHT KNEE SCOPE MEDIAL MENISCECTOMY  . LEFT HEART CATH AND CORONARY ANGIOGRAPHY N/A 06/11/2016   Procedure: Left Heart Cath and Coronary Angiography;  Surgeon: Minna Merritts, MD;  Location: [place] CV LAB;  Service: Cardiovascular;   Laterality: N/A;    Medical History: Past Medical History:  Diagnosis Date  . Abnormal LFTs   . Carcinoma ([place])   . Diabetes mellitus without complication (HCC)    borderline  . Fundic gland polyps of stomach, benign   . Gastritis   . GERD (gastroesophageal reflux disease)   . Hepatic steatosis   . Hyperlipidemia   . Hypertension   . Nonischemic cardiomyopathy (HCC)    Previous ejection fraction of 35-40% on echo. 50% on cardiac catheterization in August of 2013  . Obesity   . Sleep apnea   . Squamous cell carcinoma of skin 02/27/2020   left distal lat deltoid - [facility]    Family History: Family History  Problem Relation Age of Onset  . Heart disease Father 69       MI  . Heart attack Father   . Stomach cancer Father   . Hypertension Mother   . Breast cancer Mother     Social History   Socioeconomic History  . Marital status: Married    Spouse name: Not on file  . Number of children: 3  . Years of education: Not on file  . Highest education level: Not on file  Occupational History  . Occupation: Librarian, academic  Tobacco Use  . Smoking status: Never Smoker  . Smokeless tobacco: Never Used  Vaping Use  . Vaping Use: Never used  Substance and Sexual Activity  . Alcohol use: Not Currently    Alcohol/week: 0.0 standard drinks    Comment: rare  . Drug use: No  . Sexual activity: Not on file  Other Topics Concern  . Not on file  Social History Narrative   Married.  65 yo son.   Wife on disability for bipolar disorder.     Social Determinants of Health   Financial Resource Strain: Not on file  Food Insecurity: Not on file  Transportation Needs: Not on file  Physical Activity: Not on file  Stress: Not on file  Social Connections: Not on file  Intimate Partner Violence: Not on file      Review of Systems  Constitutional: Negative for chills, fatigue and unexpected weight change.  HENT: Negative for congestion, postnasal drip, rhinorrhea, sneezing and  sore throat.   Eyes: Negative for redness.  Respiratory: Negative for cough, chest tightness and shortness of breath.   Cardiovascular: Negative for chest pain and palpitations.  Gastrointestinal: Negative for abdominal pain, constipation, diarrhea, nausea and vomiting.  Genitourinary: Negative for dysuria and frequency.  Musculoskeletal: Negative for arthralgias, back pain, joint swelling and neck pain.  Skin: Negative for rash.  Neurological: Negative for tremors and numbness.  Hematological: Negative for adenopathy. Does not bruise/bleed easily.  Psychiatric/Behavioral: Negative for behavioral problems (Depression), sleep disturbance and suicidal ideas. The patient is not nervous/anxious.     Vital Signs: BP 134/86   Pulse 74   Temp 97.6 F (36.4 C)   Resp 16   Ht 6\' 2"  (1.88 m)   Wt (!) 307 lb 9.6 oz (139.5 kg)   SpO2 99%   BMI 39.49 kg/m    Physical Exam Vitals reviewed.  Constitutional:      Appearance: Normal appearance. He  is obese.  Cardiovascular:     Rate and Rhythm: Normal rate and regular rhythm.     Pulses: Normal pulses.     Heart sounds: Normal heart sounds.  Pulmonary:     Effort: Pulmonary effort is normal.     Breath sounds: Normal breath sounds.  Abdominal:     General: Abdomen is flat.     Palpations: Abdomen is soft.  Musculoskeletal:        General: Normal range of motion.     Cervical back: Normal range of motion.  Skin:    General: Skin is warm.  Neurological:     General: No focal deficit present.     Mental Status: He is alert and oriented to person, place, and time. Mental status is at baseline.  Psychiatric:        Mood and Affect: Mood normal.        Behavior: Behavior normal.        Thought Content: Thought content normal.        Judgment: Judgment normal.    Assessment/Plan: 1. Type 2 diabetes mellitus with hyperglycemia, unspecified whether long term insulin use (HCC) Significant improvement in A1C at 6.2 today Praised for  changes made in lifestyle to improve his weight as well as A1C Encouraged to continue with healthy lifestyle choices Has stopped all diabetic medications, will recheck A1C in 3 months--may consider restarting Trulicity as indicated - POCT HgB A1C  2. Essential hypertension BP and HR well controlled on present management, continue to monitor - lisinopril-hydrochlorothiazide (ZESTORETIC) 20-25 MG tablet; Take 1 tablet by mouth daily.  Dispense: 90 tablet; Refill: 1  3. Neuropathy involving both lower extremities Increase dosing of gabapentin to 3 times daily to help with symptom control, continue to closely monitor - gabapentin (NEURONTIN) 100 MG capsule; Take 2 capsules (200 mg total) by mouth 3 (three) times daily.  Dispense: 180 capsule; Refill: 2  4. Generalized anxiety disorder Symptoms remain well controlled, requesting refills - citalopram (CELEXA) 40 MG tablet; TAKE 1 TABLET BY MOUTH EVERY DAY FOR GAD  Dispense: 90 tablet; Refill: 1  5. Morbid obesity ([place]) Praised for recent 24 pound weight loss--encouraged to continue with healthy lifestyle modifications Obesity Counseling: Risk Assessment: An assessment of behavioral risk factors was made today and includes lack of exercise sedentary lifestyle, lack of portion control and poor dietary habits.  Risk Modification Advice: She was counseled on portion control guidelines. Restricting daily caloric intake to 1600. The detrimental long term effects of obesity on her health and ongoing poor compliance was also discussed with the patient.  6. Other fatigue Labs ordered to be reviewed at upcoming CPE - CBC w/Diff/Platelet - Comprehensive metabolic panel - Lipid Panel With LDL/HDL Ratio - TSH + free T4 - PSA  General Counseling: Brown verbalizes understanding of the findings of todays visit and agrees with plan of treatment. I have discussed any further diagnostic evaluation that may be needed or ordered today. We also reviewed his  medications today. he has been encouraged to call the office with any questions or concerns that should arise related to todays visit.    Orders Placed This Encounter  Procedures  . CBC w/Diff/Platelet  . Comprehensive metabolic panel  . Lipid Panel With LDL/HDL Ratio  . TSH + free T4  . PSA  . POCT HgB A1C    Meds ordered this encounter  Medications  . gabapentin (NEURONTIN) 100 MG capsule    Sig: Take 2 capsules (200 mg total)  by mouth 3 (three) times daily.    Dispense:  180 capsule    Refill:  2  . citalopram (CELEXA) 40 MG tablet    Sig: TAKE 1 TABLET BY MOUTH EVERY DAY FOR GAD    Dispense:  90 tablet    Refill:  1  . lisinopril-hydrochlorothiazide (ZESTORETIC) 20-25 MG tablet    Sig: Take 1 tablet by mouth daily.    Dispense:  90 tablet    Refill:  1    Time spent: 30 Minutes Time spent includes review of chart, medications, test results and follow-up plan with the patient.  This patient was seen by Theodoro Grist AGNP-C in Collaboration with Dr Lavera Guise as a part of collaborative care agreement     Tanna Furry. Germani Gavilanes AGNP-C Internal medicine

## 2020-06-04 ENCOUNTER — Encounter: Payer: Self-pay | Admitting: Hospice and Palliative Medicine

## 2020-06-05 ENCOUNTER — Other Ambulatory Visit: Payer: Self-pay

## 2020-06-18 ENCOUNTER — Other Ambulatory Visit: Payer: Self-pay

## 2020-06-18 ENCOUNTER — Encounter: Payer: Self-pay | Admitting: Nurse Practitioner

## 2020-06-18 ENCOUNTER — Ambulatory Visit: Payer: 59 | Admitting: Nurse Practitioner

## 2020-06-18 VITALS — Resp 16 | Ht 74.0 in | Wt 300.0 lb

## 2020-06-18 DIAGNOSIS — J0100 Acute maxillary sinusitis, unspecified: Secondary | ICD-10-CM | POA: Diagnosis not present

## 2020-06-18 DIAGNOSIS — E1165 Type 2 diabetes mellitus with hyperglycemia: Secondary | ICD-10-CM | POA: Diagnosis not present

## 2020-06-18 MED ORDER — AMOXICILLIN-POT CLAVULANATE 875-125 MG PO TABS: 1.0000 | ORAL_TABLET | Freq: Two times a day (BID) | ORAL | 0 refills | Status: DC

## 2020-06-18 NOTE — Progress Notes (Signed)
[facility] [place], [place] [address]  Internal MEDICINE  Telephone Visit  Patient Name: Gerald [name]  [id number]  [id number]  Date of Service: 06/18/2020  I connected with the patient at 9:40 AM by tele and verified the patients identity using two identifiers.   I discussed the limitations, risks, security and privacy concerns of performing an evaluation and management service by telephone and the availability of in person appointments. I also discussed with the patient that there may be a patient responsible charge related to the service.  The patient expressed understanding and agrees to proceed.    Chief Complaint  Patient presents with  . Acute Visit    Sinus infection, started a couple days ago, pressure around nose and eyes, nothing comes out when he tries to blow nose, feels like he is talking out of a tunnel, using OTC meds, not helping   . Telephone Assessment    Phone call  . Telephone Screen    [phone]     HPI Gerald [name] presents for tele visit for symptoms of a sinus infection. He denies fever, chills. He denies any exposure to persons who are positive for COVID-19 or awaiting test results for COVID-19. He reports that no one else in the household is sick. He denies any pain at this time. He reports that he received 2 doses of the COVID-19 vaccine. He has not received the COVID booster.  - reports the sinus infection started a couple days ago, pressure around nose and eyes, nothing comes out when he tries to blow nose, feels like he is talking out of a tunnel, using OTC meds, not helping, tried cough drops, mucinex, OTC allergy medications.    Current Medication: Outpatient Encounter Medications as of 06/18/2020  Medication Sig  . acetaminophen (TYLENOL) 325 MG tablet Take 325-650 mg by mouth every 6 (six) hours as needed for mild pain or headache.  Marland Kitchen amoxicillin-clavulanate (AUGMENTIN) 875-125 MG tablet Take 1 tablet by mouth 2 (two) times  daily.  . citalopram (CELEXA) 40 MG tablet TAKE 1 TABLET BY MOUTH EVERY DAY FOR GAD  . gabapentin (NEURONTIN) 100 MG capsule Take 2 capsules (200 mg total) by mouth 3 (three) times daily.  Marland Kitchen lisinopril-hydrochlorothiazide (ZESTORETIC) 20-25 MG tablet Take 1 tablet by mouth daily.  . nitroGLYCERIN (NITROSTAT) 0.4 MG SL tablet Place 1 tablet (0.4 mg total) under the tongue every 5 (five) minutes x 3 doses as needed for chest pain.   No facility-administered encounter medications on file as of 06/18/2020.    Surgical History: Past Surgical History:  Procedure Laterality Date  . CARDIAC CATHETERIZATION N/A 09/2011   [facility]; EF 50% with 30% mid LAD stenosis and no obstructive disease.  Marland Kitchen CARDIAC CATHETERIZATION  09/2010   [facility]; Mid LAD 40% stenosis; Mid Circumflex:Normal; Mid RCA; Normal  . CARDIAC CATHETERIZATION    . COLONOSCOPY    . ESOPHAGOGASTRODUODENOSCOPY (EGD) WITH PROPOFOL N/A 06/10/2017   Procedure: ESOPHAGOGASTRODUODENOSCOPY (EGD) WITH PROPOFOL;  Surgeon: Jonathon Bellows, MD;  Location: [facility] ENDOSCOPY;  Service: Gastroenterology;  Laterality: N/A;  . KNEE ARTHROSCOPY WITH MEDIAL MENISECTOMY Right 09/16/2012   Procedure: RIGHT KNEE ARTHROSCOPY WITH MEDIAL AND LATERAL MENISECTOMY, CHONDROPLASTY;  Surgeon: Ninetta Lights, MD;  Location: [place];  Service: Orthopedics;  Laterality: Right;  RIGHT KNEE SCOPE MEDIAL MENISCECTOMY  . LEFT HEART CATH AND CORONARY ANGIOGRAPHY N/A 06/11/2016   Procedure: Left Heart Cath and Coronary Angiography;  Surgeon: Minna Merritts, MD;  Location: [facility] INVASIVE CV  LAB;  Service: Cardiovascular;  Laterality: N/A;    Medical History: Past Medical History:  Diagnosis Date  . Abnormal LFTs   . Carcinoma ([place])   . Diabetes mellitus without complication (HCC)    borderline  . Fundic gland polyps of stomach, benign   . Gastritis   . GERD (gastroesophageal reflux disease)   . Hepatic steatosis   . Hyperlipidemia   . Hypertension   . Nonischemic  cardiomyopathy (HCC)    Previous ejection fraction of 35-40% on echo. 50% on cardiac catheterization in August of 2013  . Obesity   . Sleep apnea   . Squamous cell carcinoma of skin 02/27/2020   left distal lat deltoid - [facility]    Family History: Family History  Problem Relation Age of Onset  . Heart disease Father 39       MI  . Heart attack Father   . Stomach cancer Father   . Hypertension Mother   . Breast cancer Mother     Social History   Socioeconomic History  . Marital status: Married    Spouse name: Not on file  . Number of children: 3  . Years of education: Not on file  . Highest education level: Not on file  Occupational History  . Occupation: Librarian, academic  Tobacco Use  . Smoking status: Never Smoker  . Smokeless tobacco: Never Used  Vaping Use  . Vaping Use: Never used  Substance and Sexual Activity  . Alcohol use: Not Currently    Alcohol/week: 0.0 standard drinks    Comment: rare  . Drug use: No  . Sexual activity: Not on file  Other Topics Concern  . Not on file  Social History Narrative   Married.  76 yo son.   Wife on disability for bipolar disorder.     Social Determinants of Health   Financial Resource Strain: Not on file  Food Insecurity: Not on file  Transportation Needs: Not on file  Physical Activity: Not on file  Stress: Not on file  Social Connections: Not on file  Intimate Partner Violence: Not on file      Review of Systems  Constitutional: Negative for chills, fatigue and fever.  HENT: Positive for congestion, sinus pressure, sneezing and sore throat. Negative for mouth sores and postnasal drip.   Respiratory: Negative for cough.   Cardiovascular: Negative for chest pain.  Psychiatric/Behavioral: Negative.     Vital Signs: Resp 16   Ht 6\' 2"  (1.88 m)   Wt 300 lb (136.1 kg)   BMI 38.52 kg/m    Observation/Objective:  Patient was doing a virtual visit while driving and sounded congested. Otherwise NAD. Pleasant to  talk to during visit.    Assessment/Plan: 1. Acute non-recurrent maxillary sinusitis Prescribed amoxicillin-clavulanate for sinusitis for 10 days. Pt instructed to call office if his symptoms worsen or fail to improve. - amoxicillin-clavulanate (AUGMENTIN) 875-125 MG tablet; Take 1 tablet by mouth 2 (two) times daily.  Dispense: 20 tablet; Refill: 0  2. Type 2 diabetes mellitus with hyperglycemia, without long-term current use of insulin ([place]) Patient has a history of diabetes type 2. He is not on any diabetic medications and is currently managing diabetes with diet and lifestyle modification only.     General Counseling: panayiotis [place] understanding of the findings of today's phone visit and agrees with plan of treatment. I have discussed any further diagnostic evaluation that may be needed or ordered today. We also reviewed his medications today. he has been encouraged  to call the office with any questions or concerns that should arise related to todays visit.    No orders of the defined types were placed in this encounter.   Meds ordered this encounter  Medications  . amoxicillin-clavulanate (AUGMENTIN) 875-125 MG tablet    Sig: Take 1 tablet by mouth 2 (two) times daily.    Dispense:  20 tablet    Refill:  0    Time spent: 16 Minutes    Dr Lavera Guise Internal medicine

## 2020-08-15 LAB — LIPID PANEL WITH LDL/HDL RATIO
Cholesterol, Total: 199 mg/dL (ref 100–199)
HDL: 38 mg/dL — ABNORMAL LOW (ref >39)
LDL Chol Calc (NIH): 132 mg/dL — ABNORMAL HIGH (ref 0–99)
LDL/HDL Ratio: 3.5 ratio (ref 0.0–3.6)
Triglycerides: 159 mg/dL — ABNORMAL HIGH (ref 0–149)
VLDL Cholesterol Cal: 29 mg/dL (ref 5–40)

## 2020-08-15 LAB — CBC WITH DIFFERENTIAL/PLATELET
Basophils Absolute: 0 10*3/uL (ref 0.0–0.2)
Basos: 0 %
EOS (ABSOLUTE): 0.1 10*3/uL (ref 0.0–0.4)
Eos: 1 %
Hematocrit: 44.9 % (ref 37.5–51.0)
Hemoglobin: 15 g/dL (ref 13.0–17.7)
Immature Grans (Abs): 0 10*3/uL (ref 0.0–0.1)
Immature Granulocytes: 1 %
Lymphocytes Absolute: 3.6 10*3/uL — ABNORMAL HIGH (ref 0.7–3.1)
Lymphs: 41 %
MCH: 28.7 pg (ref 26.6–33.0)
MCHC: 33.4 g/dL (ref 31.5–35.7)
MCV: 86 fL (ref 79–97)
Monocytes Absolute: 0.6 10*3/uL (ref 0.1–0.9)
Monocytes: 7 %
Neutrophils Absolute: 4.5 10*3/uL (ref 1.4–7.0)
Neutrophils: 50 %
Platelets: 253 10*3/uL (ref 150–450)
RBC: 5.22 x10E6/uL (ref 4.14–5.80)
RDW: 13.5 % (ref 11.6–15.4)
WBC: 8.9 10*3/uL (ref 3.4–10.8)

## 2020-08-15 LAB — TSH+FREE T4
Free T4: 1.21 ng/dL (ref 0.82–1.77)
TSH: 4.15 u[IU]/mL (ref 0.450–4.500)

## 2020-08-15 LAB — COMPREHENSIVE METABOLIC PANEL
ALT: 25 IU/L (ref 0–44)
AST: 19 IU/L (ref 0–40)
Albumin/Globulin Ratio: 1.6 (ref 1.2–2.2)
Albumin: 4.2 g/dL (ref 3.8–4.9)
Alkaline Phosphatase: 91 IU/L (ref 44–121)
BUN/Creatinine Ratio: 14 (ref 9–20)
BUN: 14 mg/dL (ref 6–24)
Bilirubin Total: 0.4 mg/dL (ref 0.0–1.2)
CO2: 26 mmol/L (ref 20–29)
Calcium: 9.5 mg/dL (ref 8.7–10.2)
Chloride: 99 mmol/L (ref 96–106)
Creatinine, Ser: 0.99 mg/dL (ref 0.76–1.27)
Globulin, Total: 2.7 g/dL (ref 1.5–4.5)
Glucose: 105 mg/dL — ABNORMAL HIGH (ref 65–99)
Potassium: 4.1 mmol/L (ref 3.5–5.2)
Sodium: 140 mmol/L (ref 134–144)
Total Protein: 6.9 g/dL (ref 6.0–8.5)
eGFR: 91 mL/min/{1.73_m2} (ref >59)

## 2020-08-15 LAB — PSA: Prostate Specific Ag, Serum: 1.7 ng/mL (ref 0.0–4.0)

## 2020-08-20 ENCOUNTER — Telehealth: Payer: Self-pay

## 2020-08-20 NOTE — Telephone Encounter (Signed)
Left vm to screen for 08/21/20 appointment-Toni

## 2020-08-21 ENCOUNTER — Encounter: Payer: Self-pay | Admitting: Physician Assistant

## 2020-08-21 ENCOUNTER — Ambulatory Visit: Payer: 59 | Admitting: Physician Assistant

## 2020-08-21 ENCOUNTER — Other Ambulatory Visit: Payer: Self-pay

## 2020-08-21 DIAGNOSIS — F411 Generalized anxiety disorder: Secondary | ICD-10-CM

## 2020-08-21 DIAGNOSIS — Z0001 Encounter for general adult medical examination with abnormal findings: Secondary | ICD-10-CM

## 2020-08-21 DIAGNOSIS — Z23 Encounter for immunization: Secondary | ICD-10-CM

## 2020-08-21 DIAGNOSIS — E1165 Type 2 diabetes mellitus with hyperglycemia: Secondary | ICD-10-CM

## 2020-08-21 DIAGNOSIS — I10 Essential (primary) hypertension: Secondary | ICD-10-CM

## 2020-08-21 DIAGNOSIS — R300 Dysuria: Secondary | ICD-10-CM

## 2020-08-21 DIAGNOSIS — G5793 Unspecified mononeuropathy of bilateral lower limbs: Secondary | ICD-10-CM

## 2020-08-21 DIAGNOSIS — E669 Obesity, unspecified: Secondary | ICD-10-CM

## 2020-08-21 DIAGNOSIS — E782 Mixed hyperlipidemia: Secondary | ICD-10-CM

## 2020-08-21 LAB — POCT GLYCOSYLATED HEMOGLOBIN (HGB A1C): Hemoglobin A1C: 5.9 % — AB (ref 4.0–5.6)

## 2020-08-21 MED ORDER — ZOSTER VAC RECOMB ADJUVANTED 50 MCG/0.5ML IM SUSR: 0.5000 mL | Freq: Once | INTRAMUSCULAR | 0 refills | Status: AC

## 2020-08-21 NOTE — Progress Notes (Signed)
[facility] [place], [place] [address]  Internal MEDICINE  Office Visit Note  Patient Name: Gerald [name]  [id number]  [id number]  Date of Service: 08/21/2020  Chief Complaint  Patient presents with   Annual Exam    Statin therapy, pain in both feet, numbness, notices it more after working out, ED   Diabetes   Gastroesophageal Reflux   Sleep Apnea   Hyperlipidemia   Hypertension     HPI Pt is here for routine health maintenance examination -Takes lantus only when needed now, BG normals well controlled 103-130, only needed it once in the last month and felt shaky after. Discussed his A1c is very well controlled and does not need any diabetic medications at this time. 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 Current machine works some of the time but has problems and defintiely needs repacement -Has been working out and right shoulder hurts for the past few months, suggested voltaren gel and icing -Bp at home 150/70s before meds, well controlled today  -14mles on stationary bike, walks on treadmill and rows. He is very active -Intolerant to statins but has been making significant diet and exercise improvements and labs do show improvement. He will add fish oil supplement as well -Other labs reviewed and overall looked good. -anxiety stable on celexa -Gabapentin TID for neuropathy, foot exam done today confirms neuropathy with reduced sensation  Current Medication: Outpatient Encounter Medications as of 08/21/2020  Medication Sig   citalopram (CELEXA) 40 MG tablet TAKE 1 TABLET BY MOUTH EVERY DAY FOR GAD   gabapentin (NEURONTIN) 100 MG capsule Take 2 capsules (200 mg total) by mouth 3 (three) times daily.   insulin glargine (LANTUS) 100 UNIT/ML injection Inject 62 Units into the skin daily.    lisinopril-hydrochlorothiazide (ZESTORETIC) 20-25 MG tablet Take 1 tablet by mouth daily.   nitroGLYCERIN (NITROSTAT) 0.4 MG SL tablet Place 1 tablet (0.4 mg total) under the tongue every 5 (five) minutes x 3 doses as needed for chest pain.   [DISCONTINUED] Zoster Vaccine Adjuvanted ([facility] injection Inject 0.5 mLs into the muscle once.   Zoster Vaccine Adjuvanted ([facility] injection Inject 0.5 mLs into the muscle once for 1 dose.   [DISCONTINUED] acetaminophen (TYLENOL) 325 MG tablet Take 325-650 mg by mouth every 6 (six) hours as needed for mild pain or headache. (Patient not taking: Reported on 08/21/2020)   [DISCONTINUED] amoxicillin-clavulanate (AUGMENTIN) 875-125 MG tablet Take 1 tablet by mouth 2 (two) times daily. (Patient not taking: Reported on 08/21/2020)   No facility-administered encounter medications on file as of 08/21/2020.    Surgical History: Past Surgical History:  Procedure Laterality Date   CARDIAC CATHETERIZATION N/A 09/2011   [facility]; EF 50% with 30% mid LAD stenosis and no obstructive disease.   CARDIAC CATHETERIZATION  09/2010   [facility]; Mid LAD 40% stenosis; Mid Circumflex:Normal; Mid RCA; Normal   CARDIAC CATHETERIZATION     COLONOSCOPY     ESOPHAGOGASTRODUODENOSCOPY (EGD) WITH PROPOFOL N/A 06/10/2017   Procedure: ESOPHAGOGASTRODUODENOSCOPY (EGD) WITH PROPOFOL;  Surgeon: AJonathon Bellows MD;  [facility]  Service: Gastroenterology;  Laterality: N/A;   KNEE ARTHROSCOPY WITH MEDIAL MENISECTOMY Right 09/16/2012   Procedure: RIGHT KNEE ARTHROSCOPY WITH MEDIAL AND LATERAL MENISECTOMY, CHONDROPLASTY;  Surgeon: DNinetta Lights MD;  [place]  Service: Orthopedics;  Laterality: Right;  RIGHT KNEE SCOPE  MEDIAL MENISCECTOMY   LEFT HEART CATH AND CORONARY ANGIOGRAPHY N/A 06/11/2016   Procedure: Left Heart Cath and Coronary Angiography;  Surgeon: Minna Merritts, MD;  Location: [facility];  Service: Cardiovascular;  Laterality: N/A;     Medical History: Past Medical History:  Diagnosis Date   Abnormal LFTs    Carcinoma ([place])    Diabetes mellitus without complication (HCC)    borderline   Fundic gland polyps of stomach, benign    Gastritis    GERD (gastroesophageal reflux disease)    Hepatic steatosis    Hyperlipidemia    Hypertension    Nonischemic cardiomyopathy (HCC)    Previous ejection fraction of 35-40% on echo. 50% on cardiac catheterization in August of 2013   Obesity    Sleep apnea    Squamous cell carcinoma of skin 02/27/2020   left distal lat deltoid - [facility]    Family History: Family History  Problem Relation Age of Onset   Heart disease Father 15       MI   Heart attack Father    Stomach cancer Father    Hypertension Mother    Breast cancer Mother       Review of Systems  Constitutional:  Negative for chills, fatigue and unexpected weight change.  HENT:  Negative for congestion, postnasal drip, rhinorrhea, sneezing and sore throat.   Eyes:  Negative for redness.  Respiratory:  Negative for cough, chest tightness and shortness of breath.   Cardiovascular:  Negative for chest pain and palpitations.  Gastrointestinal:  Negative for abdominal pain, constipation, diarrhea, nausea and vomiting.  Genitourinary:  Negative for dysuria and frequency.  Musculoskeletal:  Positive for arthralgias. Negative for back pain, joint swelling and neck pain.  Skin:  Negative for rash.  Neurological:  Positive for numbness. Negative for tremors.  Hematological:  Negative for adenopathy. Does not bruise/bleed easily.  Psychiatric/Behavioral:  Positive for sleep disturbance. Negative for behavioral problems (Depression) and suicidal ideas. The patient is not nervous/anxious.     Vital Signs: BP 138/84   Pulse 70   Temp 97.6 F (36.4 C)   Resp 16   Ht 6' 3"  (1.905 m)   Wt 296 lb 3.2 oz (134.4 kg)   SpO2 98%   BMI 37.02 kg/m    Physical Exam Vitals and nursing note reviewed.  Constitutional:       General: He is not in acute distress.    Appearance: He is well-developed. He is obese. He is not diaphoretic.  HENT:     Head: Normocephalic and atraumatic.     Right Ear: External ear normal.     Left Ear: External ear normal.     Nose: Nose normal.     Mouth/Throat:     Pharynx: No oropharyngeal exudate.  Eyes:     General: No scleral icterus.       Right eye: No discharge.        Left eye: No discharge.     Conjunctiva/sclera: Conjunctivae normal.     Pupils: Pupils are equal, round, and reactive to light.  Neck:     Thyroid: No thyromegaly.     Vascular: No JVD.     Trachea: No tracheal deviation.  Cardiovascular:     Rate and Rhythm: Normal rate and regular rhythm.     Heart sounds: Normal heart sounds. No murmur heard.   No friction rub. No gallop.  Pulmonary:     Effort: Pulmonary effort is normal. No respiratory distress.  Breath sounds: Normal breath sounds. No stridor. No wheezing or rales.  Chest:     Chest wall: No tenderness.  Abdominal:     General: Bowel sounds are normal. There is no distension.     Palpations: Abdomen is soft. There is no mass.     Tenderness: There is no abdominal tenderness. There is no guarding or rebound.  Musculoskeletal:        General: No tenderness or deformity. Normal range of motion.     Cervical back: Normal range of motion and neck supple.     Right lower leg: No edema.     Left lower leg: No edema.  Feet:     Right foot:     Protective Sensation: 4 sites tested.  2 sites sensed.     Skin integrity: Callus present. No ulcer.     Toenail Condition: Right toenails are normal.     Left foot:     Protective Sensation: 4 sites tested.  2 sites sensed.     Skin integrity: Callus present. No ulcer.     Toenail Condition: Left toenails are normal.  Lymphadenopathy:     Cervical: No cervical adenopathy.  Skin:    General: Skin is warm and dry.     Coloration: Skin is not pale.     Findings: Lesion present. No erythema or  rash.     Comments: Small hard bump on outside of right ankle-not painful or bothersome  Neurological:     Mental Status: He is alert.     Cranial Nerves: No cranial nerve deficit.     Motor: No abnormal muscle tone.     Coordination: Coordination normal.     Deep Tendon Reflexes: Reflexes are normal and symmetric.  Psychiatric:        Behavior: Behavior normal.        Thought Content: Thought content normal.        Judgment: Judgment normal.     LABS: Recent Results (from the past 2160 hour(s))  POCT HgB A1C     Status: Abnormal   Collection Time: 05/28/20 11:05 AM  Result Value Ref Range   Hemoglobin A1C 6.2 (A) 4.0 - 5.6 %   HbA1c POC (<> result, manual entry)     HbA1c, POC (prediabetic range)     HbA1c, POC (controlled diabetic range)    CBC w/Diff/Platelet     Status: Abnormal   Collection Time: 08/14/20  9:54 AM  Result Value Ref Range   WBC 8.9 3.4 - 10.8 x10E3/uL   RBC 5.22 4.14 - 5.80 x10E6/uL   Hemoglobin 15.0 13.0 - 17.7 g/dL   Hematocrit 44.9 37.5 - 51.0 %   MCV 86 79 - 97 fL   MCH 28.7 26.6 - 33.0 pg   MCHC 33.4 31.5 - 35.7 g/dL   RDW 13.5 11.6 - 15.4 %   Platelets 253 150 - 450 x10E3/uL   Neutrophils 50 Not Estab. %   Lymphs 41 Not Estab. %   Monocytes 7 Not Estab. %   Eos 1 Not Estab. %   Basos 0 Not Estab. %   Neutrophils Absolute 4.5 1.4 - 7.0 x10E3/uL   Lymphocytes Absolute 3.6 (H) 0.7 - 3.1 x10E3/uL   Monocytes Absolute 0.6 0.1 - 0.9 x10E3/uL   EOS (ABSOLUTE) 0.1 0.0 - 0.4 x10E3/uL   Basophils Absolute 0.0 0.0 - 0.2 x10E3/uL   Immature Granulocytes 1 Not Estab. %   Immature Grans (Abs) 0.0 0.0 - 0.1 x10E3/uL  Comprehensive  metabolic panel     Status: Abnormal   Collection Time: 08/14/20  9:54 AM  Result Value Ref Range   Glucose 105 (H) 65 - 99 mg/dL   BUN 14 6 - 24 mg/dL   Creatinine, Ser 0.99 0.76 - 1.27 mg/dL   eGFR 91 >59 mL/min/1.73   BUN/Creatinine Ratio 14 9 - 20   Sodium 140 134 - 144 mmol/L   Potassium 4.1 3.5 - 5.2 mmol/L    Chloride 99 96 - 106 mmol/L   CO2 26 20 - 29 mmol/L   Calcium 9.5 8.7 - 10.2 mg/dL   Total Protein 6.9 6.0 - 8.5 g/dL   Albumin 4.2 3.8 - 4.9 g/dL   Globulin, Total 2.7 1.5 - 4.5 g/dL   Albumin/Globulin Ratio 1.6 1.2 - 2.2   Bilirubin Total 0.4 0.0 - 1.2 mg/dL   Alkaline Phosphatase 91 44 - 121 IU/L   AST 19 0 - 40 IU/L   ALT 25 0 - 44 IU/L  Lipid Panel With LDL/HDL Ratio     Status: Abnormal   Collection Time: 08/14/20  9:54 AM  Result Value Ref Range   Cholesterol, Total 199 100 - 199 mg/dL   Triglycerides 159 (H) 0 - 149 mg/dL   HDL 38 (L) >39 mg/dL   VLDL Cholesterol Cal 29 5 - 40 mg/dL   LDL Chol Calc (NIH) 132 (H) 0 - 99 mg/dL   LDL/HDL Ratio 3.5 0.0 - 3.6 ratio    Comment:                                     LDL/HDL Ratio                                             Men  Women                               1/2 Avg.Risk  1.0    1.5                                   Avg.Risk  3.6    3.2                                2X Avg.Risk  6.2    5.0                                3X Avg.Risk  8.0    6.1   TSH + free T4     Status: None   Collection Time: 08/14/20  9:54 AM  Result Value Ref Range   TSH 4.150 0.450 - 4.500 uIU/mL   Free T4 1.21 0.82 - 1.77 ng/dL  PSA     Status: None   Collection Time: 08/14/20  9:54 AM  Result Value Ref Range   Prostate Specific Ag, Serum 1.7 0.0 - 4.0 ng/mL    Comment: Roche ECLIA methodology. According to the American Urological Association, Serum PSA should decrease and remain at undetectable levels after radical prostatectomy. The AUA defines biochemical  recurrence as an initial PSA value 0.2 ng/mL or greater followed by a subsequent confirmatory PSA value 0.2 ng/mL or greater. Values obtained with different assay methods or kits cannot be used interchangeably. Results cannot be interpreted as absolute evidence of the presence or absence of malignant disease.   POCT HgB A1C     Status: Abnormal   Collection Time: 08/21/20 10:26 AM   Result Value Ref Range   Hemoglobin A1C 5.9 (A) 4.0 - 5.6 %   HbA1c POC (<> result, manual entry)     HbA1c, POC (prediabetic range)     HbA1c, POC (controlled diabetic range)          Assessment/Plan: 1. Encounter for general adult medical examination with abnormal findings CPE performed, UTD on colonoscopy and labs reviewed  2. Type 2 diabetes mellitus with hyperglycemia, unspecified whether long term insulin use (HCC) - POCT HgB A1C is 5.9 today. Pt has achieved excellent control with lifestyle modifications and may continue to control with diet and exercise alone.  3. Essential hypertension Stable, continue zestoretic  4. Neuropathy involving both lower extremities Continue gabapentin TID  5. Generalized anxiety disorder Stable, continue celexa  6. Mixed hyperlipidemia Intolerant to statins, will try fish oil and continue to improve diet and exercise--labs improved from last year  7. Need for shingles vaccine - Zoster Vaccine Adjuvanted [facility]) injection; Inject 0.5 mLs into the muscle once for 1 dose.  Dispense: 0.5 mL; Refill: 0  8. Obesity (BMI 35.0-39.9 without comorbidity) Pt has lost significant wt and plans to continue wt loss goals Obesity Counseling: Had a lengthy discussion regarding patients BMI and weight issues. Patient was instructed on portion control as well as increased activity. Also discussed caloric restrictions with trying to maintain intake less than 2000 Kcal. Discussions were made in accordance with the 5As of weight management. Simple actions such as not eating late and if able to, taking a walk is suggested.  9. Dysuria - UA/M w/rflx Culture, Routine   General Counseling: Salvator verbalizes understanding of the findings of todays visit and agrees with plan of treatment. I have discussed any further diagnostic evaluation that may be needed or ordered today. We also reviewed his medications today. he has been encouraged to call the office with  any questions or concerns that should arise related to todays visit.    Counseling:    Orders Placed This Encounter  Procedures   UA/M w/rflx Culture, Routine   POCT HgB A1C    Meds ordered this encounter  Medications   Zoster Vaccine Adjuvanted [facility]) injection    Sig: Inject 0.5 mLs into the muscle once for 1 dose.    Dispense:  0.5 mL    Refill:  0    This patient was seen by Drema Dallas, PA-C in collaboration with Dr. Clayborn Bigness as a part of collaborative care agreement.  Total time spent:40 Minutes  Time spent includes review of chart, medications, test results, and follow up plan with the patient.     Lavera Guise, MD  Internal Medicine

## 2020-08-22 LAB — UA/M W/RFLX CULTURE, ROUTINE
Bilirubin, UA: NEGATIVE
Glucose, UA: NEGATIVE
Ketones, UA: NEGATIVE
Leukocytes,UA: NEGATIVE
Nitrite, UA: NEGATIVE
Protein,UA: NEGATIVE
RBC, UA: NEGATIVE
Specific Gravity, UA: 1.027 (ref 1.005–1.030)
Urobilinogen, Ur: 1 mg/dL (ref 0.2–1.0)
pH, UA: 5.5 (ref 5.0–7.5)

## 2020-08-22 LAB — MICROSCOPIC EXAMINATION
Bacteria, UA: NONE SEEN
Casts: NONE SEEN /lpf
RBC, Urine: NONE SEEN /hpf (ref 0–2)
WBC, UA: NONE SEEN /hpf (ref 0–5)

## 2020-11-13 ENCOUNTER — Other Ambulatory Visit: Payer: Self-pay | Admitting: Podiatry

## 2020-11-14 ENCOUNTER — Encounter: Payer: Self-pay | Admitting: Podiatry

## 2020-11-15 NOTE — Discharge Instructions (Addendum)
[facility]  POST OPERATIVE INSTRUCTIONS FOR DR. FOWLER AND [facility] PODIAT[facility]PART[facility]   Take your medication as prescribed.  Pain medication should be taken only as needed.  Keep the dressing clean, dry and intact.  Keep your foot elevated above the heart level for the first 48 hours.  Walking to the bathroom and brief periods of walking are acceptable, unless we have instructed you to be non-weight bearing.  Always wear your post-op shoe when walking.  Always use your crutches if you are to be non-weight bearing.  Do not take a shower. Baths are permissible as long as the foot is kept out of the water.   Every hour you are awake:  Bend your knee 15 times. Flex foot 15 times Massage calf 15 times  Call [facility] ([phone]) if any of the following problems occur: You develop a temperature or fever. The bandage becomes saturated with blood. Medication does not stop your pain. Injury of the foot occurs. Any symptoms of infection including redness, odor, or red streaks running from wound.  Information for Discharge Teaching: EXPAREL (bupivacaine liposome injectable suspension)   Your surgeon or anesthesiologist gave you EXPAREL(bupivacaine) to help control your pain after surgery.  EXPAREL is a local anesthetic that provides pain relief by numbing the tissue around the surgical site. EXPAREL is designed to release pain medication over time and can control pain for up to 72 hours. Depending on how you respond to EXPAREL, you may require less pain medication during your recovery.  Possible side effects: Temporary loss of sensation or ability to move in the area where bupivacaine was injected. Nausea, vomiting, constipation Rarely, numbness and tingling in your mouth or lips, lightheadedness, or anxiety may occur. Call your doctor right away if you think you may be experiencing any of these sensations, or if  you have other questions regarding possible side effects.  Follow all other discharge instructions given to you by your surgeon or nurse. Eat a healthy diet and drink plenty of water or other fluids.  If you return to the hospital for any reason within 96 hours following the administration of EXPAREL, it is important for health care providers to know that you have received this anesthetic. A teal colored band has been placed on your arm with the date, time and amount of EXPAREL you have received in order to alert and inform your health care providers. Please leave this armband in place for the full 96 hours following administration, and then you may remove the band.   

## 2020-11-21 ENCOUNTER — Encounter
Admission: RE | Disposition: A | Discharge status: Home / Self Care | Payer: Self-pay | Source: Home / Self Care | Attending: Podiatry

## 2020-11-21 ENCOUNTER — Encounter: Payer: Self-pay | Admitting: Podiatry

## 2020-11-21 ENCOUNTER — Other Ambulatory Visit: Payer: Self-pay

## 2020-11-21 ENCOUNTER — Ambulatory Visit: Payer: 59 | Admitting: Anesthesiology

## 2020-11-21 ENCOUNTER — Ambulatory Visit
Admission: RE | Admit: 2020-11-21 | Discharge: 2020-11-21 | Disposition: A | Discharge status: Home / Self Care | Payer: 59 | Attending: Podiatry | Admitting: Podiatry

## 2020-11-21 DIAGNOSIS — M2041 Other hammer toe(s) (acquired), right foot: Secondary | ICD-10-CM | POA: Diagnosis present

## 2020-11-21 DIAGNOSIS — Z794 Long term (current) use of insulin: Secondary | ICD-10-CM | POA: Insufficient documentation

## 2020-11-21 DIAGNOSIS — Z87891 Personal history of nicotine dependence: Secondary | ICD-10-CM | POA: Insufficient documentation

## 2020-11-21 DIAGNOSIS — Z79899 Other long term (current) drug therapy: Secondary | ICD-10-CM | POA: Diagnosis not present

## 2020-11-21 DIAGNOSIS — E1140 Type 2 diabetes mellitus with diabetic neuropathy, unspecified: Secondary | ICD-10-CM | POA: Diagnosis not present

## 2020-11-21 DIAGNOSIS — Z888 Allergy status to other drugs, medicaments and biological substances status: Secondary | ICD-10-CM | POA: Insufficient documentation

## 2020-11-21 DIAGNOSIS — Z7982 Long term (current) use of aspirin: Secondary | ICD-10-CM | POA: Diagnosis not present

## 2020-11-21 DIAGNOSIS — M899 Disorder of bone, unspecified: Secondary | ICD-10-CM | POA: Diagnosis not present

## 2020-11-21 DIAGNOSIS — Z7985 Long-term (current) use of injectable non-insulin antidiabetic drugs: Secondary | ICD-10-CM | POA: Insufficient documentation

## 2020-11-21 HISTORY — PX: BONE EXCISION: SHX6730

## 2020-11-21 HISTORY — PX: HAMMER TOE SURGERY: SHX385

## 2020-11-21 SURGERY — BONE EXCISION
Anesthesia: General | Site: Toe | Laterality: Right

## 2020-11-21 MED ORDER — FENTANYL CITRATE PF 50 MCG/ML IJ SOSY: 25.0000 ug | PREFILLED_SYRINGE | INTRAMUSCULAR | Status: DC | PRN

## 2020-11-21 MED ORDER — BUPIVACAINE HCL (PF) 0.25 % IJ SOLN
INTRAMUSCULAR | Status: DC | PRN
  Administered 2020-11-21: 5 mL

## 2020-11-21 MED ORDER — ONDANSETRON HCL 4 MG/2ML IJ SOLN: 4.0000 mg | Freq: Four times a day (QID) | INTRAMUSCULAR | Status: DC | PRN

## 2020-11-21 MED ORDER — 0.9 % SODIUM CHLORIDE (POUR BTL) OPTIME
TOPICAL | Status: DC | PRN
  Administered 2020-11-21: 500 mL

## 2020-11-21 MED ORDER — PROPOFOL 10 MG/ML IV BOLUS
INTRAVENOUS | Status: DC | PRN
  Administered 2020-11-21: 150 mg via INTRAVENOUS

## 2020-11-21 MED ORDER — METOCLOPRAMIDE HCL 5 MG/ML IJ SOLN: 5.0000 mg | Freq: Three times a day (TID) | INTRAMUSCULAR | Status: DC | PRN

## 2020-11-21 MED ORDER — DEXAMETHASONE SODIUM PHOSPHATE 4 MG/ML IJ SOLN
INTRAMUSCULAR | Status: DC | PRN
  Administered 2020-11-21: 4 mg via INTRAVENOUS

## 2020-11-21 MED ORDER — ONDANSETRON HCL 4 MG PO TABS: 4.0000 mg | ORAL_TABLET | Freq: Four times a day (QID) | ORAL | Status: DC | PRN

## 2020-11-21 MED ORDER — BUPIVACAINE LIPOSOME 1.3 % IJ SUSP
INTRAMUSCULAR | Status: DC | PRN
  Administered 2020-11-21: 6 mL
  Administered 2020-11-21: 5 mL

## 2020-11-21 MED ORDER — EPHEDRINE SULFATE 50 MG/ML IJ SOLN
INTRAMUSCULAR | Status: DC | PRN
  Administered 2020-11-21: 10 mg via INTRAVENOUS
  Administered 2020-11-21 (×2): 5 mg via INTRAVENOUS

## 2020-11-21 MED ORDER — METOCLOPRAMIDE HCL 5 MG PO TABS: 5.0000 mg | ORAL_TABLET | Freq: Three times a day (TID) | ORAL | Status: DC | PRN

## 2020-11-21 MED ORDER — FENTANYL CITRATE (PF) 100 MCG/2ML IJ SOLN
INTRAMUSCULAR | Status: DC | PRN
  Administered 2020-11-21: 100 ug via INTRAVENOUS

## 2020-11-21 MED ORDER — OXYCODONE HCL 5 MG/5ML PO SOLN
5.0000 mg | Freq: Once | ORAL | Status: DC | PRN
Start: 2020-11-21 — End: 2020-11-21

## 2020-11-21 MED ORDER — MIDAZOLAM HCL 5 MG/5ML IJ SOLN
INTRAMUSCULAR | Status: DC | PRN
  Administered 2020-11-21: 2 mg via INTRAVENOUS

## 2020-11-21 MED ORDER — CEFAZOLIN SODIUM-DEXTROSE 2-4 GM/100ML-% IV SOLN
2.0000 g | INTRAVENOUS | Status: AC
  Administered 2020-11-21: 2 g via INTRAVENOUS

## 2020-11-21 MED ORDER — GLYCOPYRROLATE 0.2 MG/ML IJ SOLN
INTRAMUSCULAR | Status: DC | PRN
  Administered 2020-11-21: .1 mg via INTRAVENOUS

## 2020-11-21 MED ORDER — POVIDONE-IODINE 7.5 % EX SOLN: Freq: Once | CUTANEOUS | Status: AC

## 2020-11-21 MED ORDER — ONDANSETRON HCL 4 MG/2ML IJ SOLN
INTRAMUSCULAR | Status: DC | PRN
  Administered 2020-11-21: 4 mg via INTRAVENOUS

## 2020-11-21 MED ORDER — LACTATED RINGERS IV SOLN: INTRAVENOUS | Status: DC

## 2020-11-21 MED ORDER — OXYCODONE HCL 5 MG PO TABS: 5.0000 mg | ORAL_TABLET | Freq: Once | ORAL | Status: DC | PRN

## 2020-11-21 MED ORDER — LIDOCAINE HCL (CARDIAC) PF 100 MG/5ML IV SOSY
PREFILLED_SYRINGE | INTRAVENOUS | Status: DC | PRN
Start: 2020-11-21 — End: 2020-11-21
  Administered 2020-11-21: 50 mg via INTRATRACHEAL

## 2020-11-21 MED ORDER — OXYCODONE-ACETAMINOPHEN 5-325 MG PO TABS: 1.0000 | ORAL_TABLET | Freq: Four times a day (QID) | ORAL | 0 refills | Status: DC | PRN

## 2020-11-21 SURGICAL SUPPLY — 46 items
APL SKNCLS STERI-STRIP NONHPOA (GAUZE/BANDAGES/DRESSINGS) ×2
BENZOIN TINCTURE PRP APPL 2/3 (GAUZE/BANDAGES/DRESSINGS) ×3 IMPLANT
BLADE MED AGGRESSIVE (BLADE) IMPLANT
BLADE OSC/SAGITTAL MD 5.5X18 (BLADE) ×3 IMPLANT
BLADE SURG 15 STRL LF DISP TIS (BLADE) ×2 IMPLANT
BLADE SURG 15 STRL SS (BLADE) ×3
BNDG CMPR 75X41 PLY HI ABS (GAUZE/BANDAGES/DRESSINGS) ×2
BNDG COHESIVE 4X5 TAN STRL (GAUZE/BANDAGES/DRESSINGS) ×3 IMPLANT
BNDG CONFORM 4 STRL LF (GAUZE/BANDAGES/DRESSINGS) ×3 IMPLANT
BNDG ELASTIC 4X5.8 VLCR STR LF (GAUZE/BANDAGES/DRESSINGS) ×3 IMPLANT
BNDG ESMARK 4X12 TAN STRL LF (GAUZE/BANDAGES/DRESSINGS) ×3 IMPLANT
BNDG GAUZE ELAST 4 BULKY (GAUZE/BANDAGES/DRESSINGS) ×3 IMPLANT
BNDG STRETCH 4X75 STRL LF (GAUZE/BANDAGES/DRESSINGS) ×3 IMPLANT
CANISTER SUCT 1200ML W/VALVE (MISCELLANEOUS) ×3 IMPLANT
COVER LIGHT HANDLE UNIVERSAL (MISCELLANEOUS) ×6 IMPLANT
CUFF TOURN SGL QUICK 18X4 (TOURNIQUET CUFF) ×3 IMPLANT
DRAPE FLUOR MINI C-ARM 54X84 (DRAPES) ×3 IMPLANT
DURAPREP 26ML APPLICATOR (WOUND CARE) ×3 IMPLANT
ELECT REM PT RETURN 9FT ADLT (ELECTROSURGICAL) ×3
ELECTRODE REM PT RTRN 9FT ADLT (ELECTROSURGICAL) ×2 IMPLANT
GAUZE SPONGE 4X4 12PLY STRL (GAUZE/BANDAGES/DRESSINGS) ×3 IMPLANT
GAUZE XEROFORM 1X8 LF (GAUZE/BANDAGES/DRESSINGS) ×3 IMPLANT
GLOVE SRG 8 PF TXTR STRL LF DI (GLOVE) ×2 IMPLANT
GLOVE SURG ENC MOIS LTX SZ7.5 (GLOVE) ×3 IMPLANT
GLOVE SURG UNDER POLY LF SZ8 (GLOVE) ×3
GOWN STRL REUS W/ TWL LRG LVL3 (GOWN DISPOSABLE) ×4 IMPLANT
GOWN STRL REUS W/TWL LRG LVL3 (GOWN DISPOSABLE) ×6
K-WIRE DBL END TROCAR 6X.045 (WIRE)
K-WIRE DBL END TROCAR 6X.062 (WIRE)
KIT TURNOVER KIT A (KITS) ×3 IMPLANT
KWIRE DBL END TROCAR 6X.045 (WIRE) IMPLANT
KWIRE DBL END TROCAR 6X.062 (WIRE) IMPLANT
NS IRRIG 500ML POUR BTL (IV SOLUTION) ×3 IMPLANT
PACK EXTREMITY ARMC (MISCELLANEOUS) ×3 IMPLANT
PENCIL SMOKE EVACUATOR (MISCELLANEOUS) ×3 IMPLANT
PIN BALLS 3/8 F/.045 WIRE (MISCELLANEOUS) ×3 IMPLANT
RASP SM TEAR CROSS CUT (RASP) IMPLANT
STOCKINETTE IMPERVIOUS LG (DRAPES) ×3 IMPLANT
STRIP CLOSURE SKIN 1/4X4 (GAUZE/BANDAGES/DRESSINGS) ×3 IMPLANT
SUT ETHILON 3-0 (SUTURE) ×6 IMPLANT
SUT ETHILON 5-0 FS-2 18 BLK (SUTURE) IMPLANT
SUT MNCRL 5-0+ PC-1 (SUTURE) IMPLANT
SUT MONOCRYL 5-0 (SUTURE)
SUT VIC AB 3-0 SH 27 (SUTURE) ×3
SUT VIC AB 3-0 SH 27X BRD (SUTURE) ×2 IMPLANT
SUT VIC AB 4-0 FS2 27 (SUTURE) IMPLANT

## 2020-11-21 NOTE — H&P (Signed)
HISTORY AND PHYSICAL INTERVAL NOTE:  11/21/2020  1:15 PM  Katheran Awe  has presented today for surgery, with the diagnosis of M89.8X9- Exostosis M20.41- Hammertoe of right foot.  The various methods of treatment have been discussed with the patient.  No guarantees were given.  After consideration of risks, benefits and other options for treatment, the patient has consented to surgery.  I have reviewed the patients' chart and labs.     A history and physical examination was performed in my office.  The patient was reexamined.  There have been no changes to this history and physical examination.  Samara Deist A

## 2020-11-21 NOTE — Anesthesia Procedure Notes (Signed)
Procedure Name: LMA Insertion Date/Time: 11/21/2020 1:50 PM Performed by: Mayme Genta, CRNA Pre-anesthesia Checklist: Patient identified, Emergency Drugs available, Suction available, Timeout performed and Patient being monitored Patient Re-evaluated:Patient Re-evaluated prior to induction Oxygen Delivery Method: Circle system utilized Preoxygenation: Pre-oxygenation with 100% oxygen Induction Type: IV induction LMA: LMA inserted LMA Size: 4.0 Number of attempts: 1 Placement Confirmation: positive ETCO2 and breath sounds checked- equal and bilateral Tube secured with: Tape

## 2020-11-21 NOTE — Op Note (Signed)
Operative note   Surgeon:Lanea Vankirk Lawyer: None    Preop diagnosis: 1.  Hammertoe right fifth toe 2.  Exostosis right fourth toe    Postop diagnosis: Same    Procedure: 1.  Derotational arthroplasty right fifth toe PIPJ 2.  Exostectomy PIPJ right fourth toe    EBL: Minimal    Anesthesia:local and general    Hemostasis: Mid calf tourniquet inflated to 200 mmHg for approximately 25 minutes    Specimen: None    Complications: None    Operative indications:Gerald [name] is an 55 y.o. that presents today for surgical intervention.  The risks/benefits/alternatives/complications have been discussed and consent has been given.    Procedure:  Patient was brought into the OR and placed on the operating table in thesupine position. After anesthesia was obtained theright lower extremity was prepped and draped in usual sterile fashion.  Attention was directed to the right foot where 2 semielliptical incisions were performed overlying the PIPJ of the fifth toe.  This was placed from dorsal distal to proximal lateral.  Full-thickness skin flap was removed.  The longus tensor tendon was then transected.  The head of the proximal phalanx was exposed.  This was then excised at the level of the head of the proximal phalanx.  Attention was then directed to the dorsal lateral aspect of the fourth toe where a longitudinal incision was performed.  Sharp and blunt dissection carried down to the periosteum.  Subperiosteal dissection was then performed.  The lateral aspect of the head of the proximal phalanx and lateral middle phalanx were then excised with a power small saw.  The wounds were all flushed with copious amounts of irrigation.  The fourth toe was closed with a 3-0 Vicryl and 3-0 nylon.  The fifth toe was held in a derotated position and closed with a 3-0 Vicryl for the tendon and subcutaneous tissue and a 3-0 nylon for skin.  Small skin flaps were created proximal distal to remove dogears  to realign the fifth toe.  Good realignment was noted.  At this time a bulky sterile dressing was then applied.    Patient tolerated the procedure and anesthesia well.  Was transported from the OR to the PACU with all vital signs stable and vascular status intact. To be discharged per routine protocol.  Will follow up in approximately 1 week in the outpatient clinic.

## 2020-11-21 NOTE — Anesthesia Postprocedure Evaluation (Signed)
Anesthesia Post Note  Patient: Gerald [name]  Procedure(s) Performed: PART EXCISION BONE- PHALANX (Right: Foot) HAMMERTOE CORRECTION (Right: Toe)     Patient location during evaluation: PACU Anesthesia Type: General Level of consciousness: awake and alert Pain management: pain level controlled Vital Signs Assessment: post-procedure vital signs reviewed and stable Respiratory status: spontaneous breathing and nonlabored ventilation Cardiovascular status: blood pressure returned to baseline Postop Assessment: no apparent nausea or vomiting Anesthetic complications: no   No notable events documented.  Keymon Mcelroy [employer]

## 2020-11-21 NOTE — Anesthesia Preprocedure Evaluation (Addendum)
Anesthesia Evaluation  Patient identified by MRN, date of birth, ID band  Airway Mallampati: II  TM Distance: >3 FB Neck ROM: Full    Dental no notable dental hx.    Pulmonary sleep apnea and Continuous Positive Airway Pressure Ventilation ,    Pulmonary exam normal        Cardiovascular hypertension, Normal cardiovascular exam  ECHO 01/2018 EF 60% No valvulopathies   Neuro/Psych PSYCHIATRIC DISORDERS Anxiety Depression    GI/Hepatic GERD  Controlled,  Endo/Other  diabetes, Type 2BMI 38  Renal/GU      Musculoskeletal   Abdominal (+) + obese,   Peds  Hematology   Anesthesia Other Findings   Reproductive/Obstetrics                            Anesthesia Physical Anesthesia Plan  ASA: 3  Anesthesia Plan: General   Post-op Pain Management:    Induction: Intravenous  PONV Risk Score and Plan: 2 and Midazolam, Ondansetron and Treatment may vary due to age or medical condition  Airway Management Planned: LMA  Additional Equipment: None  Intra-op Plan:   Post-operative Plan: Extubation in OR  Informed Consent: I have reviewed the patients History and Physical, chart, labs and discussed the procedure including the risks, benefits and alternatives for the proposed anesthesia with the patient or authorized representative who has indicated his/her understanding and acceptance.     Dental advisory given  Plan Discussed with: CRNA  Anesthesia Plan Comments:         Anesthesia Quick Evaluation

## 2020-11-21 NOTE — Transfer of Care (Signed)
Immediate Anesthesia Transfer of Care Note  Patient: Gerald [name]  Procedure(s) Performed: PART EXCISION BONE- PHALANX (Right: Foot) HAMMERTOE CORRECTION (Right: Toe)  Patient Location: PACU  Anesthesia Type: General  Level of Consciousness: awake, alert  and patient cooperative  Airway and Oxygen Therapy: Patient Spontanous Breathing and Patient connected to supplemental oxygen  Post-op Assessment: Post-op Vital signs reviewed, Patient's Cardiovascular Status Stable, Respiratory Function Stable, Patent Airway and No signs of Nausea or vomiting  Post-op Vital Signs: Reviewed and stable  Complications: No notable events documented.

## 2020-11-22 ENCOUNTER — Encounter: Payer: Self-pay | Admitting: Podiatry

## 2020-11-26 ENCOUNTER — Ambulatory Visit: Payer: 59 | Admitting: Physician Assistant

## 2021-01-02 ENCOUNTER — Other Ambulatory Visit: Payer: Self-pay

## 2021-01-02 DIAGNOSIS — F411 Generalized anxiety disorder: Secondary | ICD-10-CM

## 2021-01-02 MED ORDER — CITALOPRAM HYDROBROMIDE 40 MG PO TABS: ORAL_TABLET | ORAL | 0 refills | Status: DC

## 2021-01-16 ENCOUNTER — Other Ambulatory Visit: Payer: Self-pay

## 2021-01-16 DIAGNOSIS — I10 Essential (primary) hypertension: Secondary | ICD-10-CM

## 2021-01-16 DIAGNOSIS — G5793 Unspecified mononeuropathy of bilateral lower limbs: Secondary | ICD-10-CM

## 2021-01-16 MED ORDER — LISINOPRIL-HYDROCHLOROTHIAZIDE 20-25 MG PO TABS: 1.0000 | ORAL_TABLET | Freq: Every day | ORAL | 0 refills | Status: DC

## 2021-01-16 MED ORDER — GABAPENTIN 100 MG PO CAPS: 200.0000 mg | ORAL_CAPSULE | Freq: Three times a day (TID) | ORAL | 0 refills | Status: DC

## 2021-01-17 ENCOUNTER — Ambulatory Visit (INDEPENDENT_AMBULATORY_CARE_PROVIDER_SITE_OTHER): Payer: 59 | Admitting: Physician Assistant

## 2021-01-17 ENCOUNTER — Other Ambulatory Visit: Payer: Self-pay

## 2021-01-17 ENCOUNTER — Encounter: Payer: Self-pay | Admitting: Physician Assistant

## 2021-01-17 VITALS — BP 142/72 | HR 79 | Temp 98.9°F | Resp 16 | Ht 74.0 in | Wt 321.6 lb

## 2021-01-17 DIAGNOSIS — F411 Generalized anxiety disorder: Secondary | ICD-10-CM | POA: Diagnosis not present

## 2021-01-17 DIAGNOSIS — I10 Essential (primary) hypertension: Secondary | ICD-10-CM

## 2021-01-17 DIAGNOSIS — Z9889 Other specified postprocedural states: Secondary | ICD-10-CM

## 2021-01-17 DIAGNOSIS — E1165 Type 2 diabetes mellitus with hyperglycemia: Secondary | ICD-10-CM

## 2021-01-17 DIAGNOSIS — Z6841 Body Mass Index (BMI) 40.0 and over, adult: Secondary | ICD-10-CM

## 2021-01-17 LAB — POCT GLYCOSYLATED HEMOGLOBIN (HGB A1C): Hemoglobin A1C: 6.9 % — AB (ref 4.0–5.6)

## 2021-01-17 MED ORDER — LISINOPRIL-HYDROCHLOROTHIAZIDE 20-25 MG PO TABS: 1.0000 | ORAL_TABLET | Freq: Every day | ORAL | 1 refills | Status: DC

## 2021-01-17 MED ORDER — CITALOPRAM HYDROBROMIDE 40 MG PO TABS: ORAL_TABLET | ORAL | 1 refills | Status: DC

## 2021-01-17 MED ORDER — BUPROPION HCL ER (XL) 150 MG PO TB24: 150.0000 mg | ORAL_TABLET | Freq: Every day | ORAL | 1 refills | Status: DC

## 2021-01-17 NOTE — Progress Notes (Signed)
[facility] [place], [place] [address]  Internal MEDICINE  Office Visit Note  Patient Name: Gerald [name]  [id number]  [id number]  Date of Service: 01/28/2021  Chief Complaint  Patient presents with   Follow-up   Diabetes   Gastroesophageal Reflux   Hyperlipidemia   Hypertension   Medication Refill    HPI Pt is here for routine follow up -BG 135-150, lantus shot once per week 64 units -Gabapentin increased to 300mg  TID per day due to neuropathy -Recently had right foot surgery, had bone out of 4th and 5th toe. Podiatry following. Starting to get back into exercising but not like he was before. Can take time to heal before, he thinks ~72months to recover.  -Unfortunately, due to surgery he was very limited in what he could which meant no exercise and more eating which has led him to put on some wight after great weight loss previously. Up 25lbs since last visit. -Celexa not as effective on its own with anxiety. Will add wellbutrin to see if this helps some with mood as well as weight loss efforts  Current Medication: Outpatient Encounter Medications as of 01/17/2021  Medication Sig   buPROPion (WELLBUTRIN XL) 150 MG 24 hr tablet Take 1 tablet (150 mg total) by mouth daily.   gabapentin (NEURONTIN) 100 MG capsule Take 2 capsules (200 mg total) by mouth 3 (three) times daily.   lisinopril (ZESTRIL) 5 MG tablet Take 5 mg by mouth daily.   Multiple Vitamins-Minerals (MULTIVITAMIN ADULTS PO) Take by mouth.   nitroGLYCERIN (NITROSTAT) 0.4 MG SL tablet Place 1 tablet (0.4 mg total) under the tongue every 5 (five) minutes x 3 doses as needed for chest pain.   oxyCODONE-acetaminophen (PERCOCET) 5-325 MG tablet Take 1-2 tablets by mouth every 6 (six) hours as needed for severe pain. Max 6 tabs per day   [DISCONTINUED] citalopram (CELEXA) 40 MG tablet TAKE 1 TABLET BY MOUTH EVERY DAY FOR GAD   [DISCONTINUED] lisinopril-hydrochlorothiazide (ZESTORETIC) 20-25 MG  tablet Take 1 tablet by mouth daily.   citalopram (CELEXA) 40 MG tablet TAKE 1 TABLET BY MOUTH EVERY DAY FOR GAD   insulin glargine (LANTUS) 100 UNIT/ML injection Inject 62 Units into the skin daily. (Patient not taking: Reported on 01/17/2021)   lisinopril-hydrochlorothiazide (ZESTORETIC) 20-25 MG tablet Take 1 tablet by mouth daily.   No facility-administered encounter medications on file as of 01/17/2021.    Surgical History: Past Surgical History:  Procedure Laterality Date   BONE EXCISION Right 11/21/2020   Procedure: PART EXCISION BONE- PHALANX;  Surgeon: Samara Deist, DPM;  Location: [place];  Service: Podiatry;  Laterality: Right;   CARDIAC CATHETERIZATION N/A 09/2011   [facility]; EF 50% with 30% mid LAD stenosis and no obstructive disease.   CARDIAC CATHETERIZATION  09/2010   [facility]; Mid LAD 40% stenosis; Mid Circumflex:Normal; Mid RCA; Normal   CARDIAC CATHETERIZATION     COLONOSCOPY     ESOPHAGOGASTRODUODENOSCOPY (EGD) WITH PROPOFOL N/A 06/10/2017   Procedure: ESOPHAGOGASTRODUODENOSCOPY (EGD) WITH PROPOFOL;  Surgeon: Jonathon Bellows, MD;  Location: [facility] ENDOSCOPY;  Service: Gastroenterology;  Laterality: N/A;   HAMMER TOE SURGERY Right 11/21/2020   Procedure: HAMMERTOE CORRECTION;  Surgeon: Samara Deist, DPM;  Location: [place];  Service: Podiatry;  Laterality: Right;   KNEE ARTHROSCOPY WITH MEDIAL MENISECTOMY Right 09/16/2012   Procedure: RIGHT KNEE ARTHROSCOPY WITH MEDIAL AND LATERAL MENISECTOMY, CHONDROPLASTY;  Surgeon: Ninetta Lights, MD;  Location: [place];  Service: Orthopedics;  Laterality: Right;  RIGHT KNEE SCOPE MEDIAL MENISCECTOMY   LEFT HEART CATH AND CORONARY ANGIOGRAPHY N/A 06/11/2016   Procedure: Left Heart Cath and Coronary Angiography;  Surgeon: Minna Merritts, MD;  Location: [place] [facility];  Service: Cardiovascular;  Laterality: N/A;    Medical History: Past Medical History:  Diagnosis Date   Abnormal LFTs     Carcinoma ([place])    Diabetes mellitus without complication ([place])    borderline (11/14/20 - lost weight - no current issues)   Fundic gland polyps of stomach, benign    Gastritis    GERD (gastroesophageal reflux disease)    Hepatic steatosis    Hyperlipidemia    Hypertension    Nonischemic cardiomyopathy (HCC)    Previous ejection fraction of 35-40% on echo. 50% on cardiac catheterization in August of 2013   Obesity    Sleep apnea    Squamous cell carcinoma of skin 02/27/2020   left distal lat deltoid - [facility]    Family History: Family History  Problem Relation Age of Onset   Heart disease Father 55       MI   Heart attack Father    Stomach cancer Father    Hypertension Mother    Breast cancer Mother     Social History   Socioeconomic History   Marital status: Married    Spouse name: Not on file   Number of children: 3   Years of education: Not on file   Highest education level: Not on file  Occupational History   Occupation: Librarian, academic  Tobacco Use   Smoking status: Never   Smokeless tobacco: Former    Quit date: 1987  Scientific laboratory technician Use: Never used  Substance and Sexual Activity   Alcohol use: Not Currently    Alcohol/week: 0.0 standard drinks    Comment: rare   Drug use: No   Sexual activity: Not on file  Other Topics Concern   Not on file  Social History Narrative   Married.  67 yo son.   Wife on disability for bipolar disorder.     Social Determinants of Health   Financial Resource Strain: Not on file  Food Insecurity: Not on file  Transportation Needs: Not on file  Physical Activity: Not on file  Stress: Not on file  Social Connections: Not on file  Intimate Partner Violence: Not on file      Review of Systems  Constitutional:  Negative for chills, fatigue and unexpected weight change.  HENT:  Negative for congestion, postnasal drip, rhinorrhea, sneezing and sore throat.   Eyes:  Negative for redness.  Respiratory:  Negative for  cough, chest tightness and shortness of breath.   Cardiovascular:  Negative for chest pain and palpitations.  Gastrointestinal:  Negative for abdominal pain, constipation, diarrhea, nausea and vomiting.  Genitourinary:  Negative for dysuria and frequency.  Musculoskeletal:  Positive for arthralgias. Negative for back pain, joint swelling and neck pain.  Skin:  Negative for rash.  Neurological:  Positive for numbness. Negative for tremors.  Hematological:  Negative for adenopathy. Does not bruise/bleed easily.  Psychiatric/Behavioral:  Positive for sleep disturbance. Negative for behavioral problems (Depression) and suicidal ideas. The patient is nervous/anxious.    Vital Signs: BP (!) 142/72 Comment: 167/100   Pulse 79    Temp 98.9 F (37.2 C)    Resp 16    Ht 6\' 2"  (1.88 m)    Wt (!) 321 lb 9.6 oz (145.9 kg)    SpO2 97%  BMI 41.29 kg/m    Physical Exam Vitals and nursing note reviewed.  Constitutional:      General: He is not in acute distress.    Appearance: He is well-developed. He is obese. He is not diaphoretic.  HENT:     Head: Normocephalic and atraumatic.     Mouth/Throat:     Pharynx: No oropharyngeal exudate.  Eyes:     Pupils: Pupils are equal, round, and reactive to light.  Neck:     Thyroid: No thyromegaly.     Vascular: No JVD.     Trachea: No tracheal deviation.  Cardiovascular:     Rate and Rhythm: Normal rate and regular rhythm.     Heart sounds: Normal heart sounds. No murmur heard.   No friction rub. No gallop.  Pulmonary:     Effort: Pulmonary effort is normal. No respiratory distress.     Breath sounds: No wheezing or rales.  Chest:     Chest wall: No tenderness.  Abdominal:     General: Bowel sounds are normal.     Palpations: Abdomen is soft.  Musculoskeletal:        General: Normal range of motion.     Cervical back: Normal range of motion and neck supple.  Lymphadenopathy:     Cervical: No cervical adenopathy.  Skin:    General: Skin is warm  and dry.  Neurological:     Mental Status: He is alert and oriented to person, place, and time.     Cranial Nerves: No cranial nerve deficit.  Psychiatric:        Behavior: Behavior normal.        Thought Content: Thought content normal.        Judgment: Judgment normal.       Assessment/Plan: 1. Type 2 diabetes mellitus with hyperglycemia, unspecified whether long term insulin use (HCC) - POCT HgB A1C is 6.9 which is increased from 5.9 last visit. Will work on improving diet and exercise as able. May need to consider restarting trulicity  2. Essential hypertension Elevated in office, will continue current medication and monitor closely - lisinopril-hydrochlorothiazide (ZESTORETIC) 20-25 MG tablet; Take 1 tablet by mouth daily.  Dispense: 90 tablet; Refill: 1  3. Generalized anxiety disorder Will continue celexa and add wellbutrin - citalopram (CELEXA) 40 MG tablet; TAKE 1 TABLET BY MOUTH EVERY DAY FOR GAD  Dispense: 90 tablet; Refill: 1  4. S/P foot surgery Followed by podiatry  5. Morbid obesity with BMI of 40.0-44.9, adult ([place]) Will start wellbutrin to aid mood and weight loss Obesity Counseling: Had a lengthy discussion regarding patients BMI and weight issues. Patient was instructed on portion control as well as increased activity. Also discussed caloric restrictions with trying to maintain intake less than 2000 Kcal. Discussions were made in accordance with the 5As of weight management. Simple actions such as not eating late and if able to, taking a walk is suggested.    General Counseling: ryon layton understanding of the findings of todays visit and agrees with plan of treatment. I have discussed any further diagnostic evaluation that may be needed or ordered today. We also reviewed his medications today. he has been encouraged to call the office with any questions or concerns that should arise related to todays visit.    Orders Placed This Encounter  Procedures    POCT HgB A1C    Meds ordered this encounter  Medications   buPROPion (WELLBUTRIN XL) 150 MG 24 hr tablet  Sig: Take 1 tablet (150 mg total) by mouth daily.    Dispense:  90 tablet    Refill:  1   lisinopril-hydrochlorothiazide (ZESTORETIC) 20-25 MG tablet    Sig: Take 1 tablet by mouth daily.    Dispense:  90 tablet    Refill:  1   citalopram (CELEXA) 40 MG tablet    Sig: TAKE 1 TABLET BY MOUTH EVERY DAY FOR GAD    Dispense:  90 tablet    Refill:  1    This patient was seen by Drema Dallas, PA-C in collaboration with Dr. Clayborn Bigness as a part of collaborative care agreement.   Total time spent:30 Minutes Time spent includes review of chart, medications, test results, and follow up plan with the patient.      Dr Lavera Guise Internal medicine

## 2021-02-26 ENCOUNTER — Ambulatory Visit: Payer: 59 | Admitting: Nurse Practitioner

## 2021-02-26 ENCOUNTER — Encounter: Payer: Self-pay | Admitting: Nurse Practitioner

## 2021-02-26 ENCOUNTER — Other Ambulatory Visit: Payer: Self-pay

## 2021-02-26 VITALS — BP 164/90 | HR 70 | Temp 99.0°F | Resp 16 | Ht 74.0 in | Wt 327.8 lb

## 2021-02-26 DIAGNOSIS — Z6841 Body Mass Index (BMI) 40.0 and over, adult: Secondary | ICD-10-CM

## 2021-02-26 DIAGNOSIS — R0789 Other chest pain: Secondary | ICD-10-CM

## 2021-02-26 DIAGNOSIS — F411 Generalized anxiety disorder: Secondary | ICD-10-CM | POA: Diagnosis not present

## 2021-02-26 DIAGNOSIS — I10 Essential (primary) hypertension: Secondary | ICD-10-CM | POA: Diagnosis not present

## 2021-02-26 MED ORDER — LISINOPRIL-HYDROCHLOROTHIAZIDE 20-12.5 MG PO TABS: 2.0000 | ORAL_TABLET | Freq: Every day | ORAL | 2 refills | Status: DC

## 2021-02-26 NOTE — Progress Notes (Signed)
[facility] [place], [place] [address]  Internal MEDICINE  Office Visit Note  Patient Name: Gerald [name]  [id number]  [id number]  Date of Service: 02/26/2021  Chief Complaint  Patient presents with   Acute Visit    Pt feels cold    Hypertension   Fatigue   Headache    Pain radiates into upper back     HPI [place] presents for a follow-up visit for      Current Medication: Outpatient Encounter Medications as of 02/26/2021  Medication Sig   buPROPion (WELLBUTRIN XL) 150 MG 24 hr tablet Take 1 tablet (150 mg total) by mouth daily.   citalopram (CELEXA) 40 MG tablet TAKE 1 TABLET BY MOUTH EVERY DAY FOR GAD   gabapentin (NEURONTIN) 100 MG capsule Take 2 capsules (200 mg total) by mouth 3 (three) times daily.   insulin glargine (LANTUS) 100 UNIT/ML injection Inject 62 Units into the skin daily.   Multiple Vitamins-Minerals (MULTIVITAMIN ADULTS PO) Take by mouth.   nitroGLYCERIN (NITROSTAT) 0.4 MG SL tablet Place 1 tablet (0.4 mg total) under the tongue every 5 (five) minutes x 3 doses as needed for chest pain.   oxyCODONE-acetaminophen (PERCOCET) 5-325 MG tablet Take 1-2 tablets by mouth every 6 (six) hours as needed for severe pain. Max 6 tabs per day   [DISCONTINUED] lisinopril (ZESTRIL) 5 MG tablet Take 5 mg by mouth daily.   [DISCONTINUED] lisinopril-hydrochlorothiazide (ZESTORETIC) 20-12.5 MG tablet Take 2 tablets by mouth daily.   [DISCONTINUED] lisinopril-hydrochlorothiazide (ZESTORETIC) 20-25 MG tablet Take 1 tablet by mouth daily.   No facility-administered encounter medications on file as of 02/26/2021.    Surgical History: Past Surgical History:  Procedure Laterality Date   BONE EXCISION Right 11/21/2020   Procedure: PART EXCISION BONE- PHALANX;  Surgeon: Samara Deist, DPM;  Location: [place];  Service: Podiatry;  Laterality: Right;   CARDIAC CATHETERIZATION N/A 09/2011   [facility]; EF 50% with 30% mid LAD stenosis and no  obstructive disease.   CARDIAC CATHETERIZATION  09/2010   [facility]; Mid LAD 40% stenosis; Mid Circumflex:Normal; Mid RCA; Normal   CARDIAC CATHETERIZATION     COLONOSCOPY     ESOPHAGOGASTRODUODENOSCOPY (EGD) WITH PROPOFOL N/A 06/10/2017   Procedure: ESOPHAGOGASTRODUODENOSCOPY (EGD) WITH PROPOFOL;  Surgeon: Jonathon Bellows, MD;  Location: [facility] ENDOSCOPY;  Service: Gastroenterology;  Laterality: N/A;   HAMMER TOE SURGERY Right 11/21/2020   Procedure: HAMMERTOE CORRECTION;  Surgeon: Samara Deist, DPM;  Location: [place];  Service: Podiatry;  Laterality: Right;   KNEE ARTHROSCOPY WITH MEDIAL MENISECTOMY Right 09/16/2012   Procedure: RIGHT KNEE ARTHROSCOPY WITH MEDIAL AND LATERAL MENISECTOMY, CHONDROPLASTY;  Surgeon: Ninetta Lights, MD;  Location: [place];  Service: Orthopedics;  Laterality: Right;  RIGHT KNEE SCOPE MEDIAL MENISCECTOMY   LEFT HEART CATH AND CORONARY ANGIOGRAPHY N/A 06/11/2016   Procedure: Left Heart Cath and Coronary Angiography;  Surgeon: Minna Merritts, MD;  Location: [place] [facility];  Service: Cardiovascular;  Laterality: N/A;    Medical History: Past Medical History:  Diagnosis Date   Abnormal LFTs    Carcinoma ([place])    Diabetes mellitus without complication ([place])    borderline (11/14/20 - lost weight - no current issues)   Fundic gland polyps of stomach, benign    Gastritis    GERD (gastroesophageal reflux disease)    Hepatic steatosis    Hyperlipidemia    Hypertension    Nonischemic cardiomyopathy (HCC)    Previous ejection fraction of 35-40% on  echo. 50% on cardiac catheterization in August of 2013   Obesity    Sleep apnea    Squamous cell carcinoma of skin 02/27/2020   left distal lat deltoid - [facility]    Family History: Family History  Problem Relation Age of Onset   Heart disease Father 62       MI   Heart attack Father    Stomach cancer Father    Hypertension Mother    Breast cancer Mother     Social History    Socioeconomic History   Marital status: Married    Spouse name: Not on file   Number of children: 3   Years of education: Not on file   Highest education level: Not on file  Occupational History   Occupation: Librarian, academic  Tobacco Use   Smoking status: Never   Smokeless tobacco: Former    Quit date: 1987  Scientific laboratory technician Use: Never used  Substance and Sexual Activity   Alcohol use: Not Currently    Alcohol/week: 0.0 standard drinks    Comment: rare   Drug use: No   Sexual activity: Not on file  Other Topics Concern   Not on file  Social History Narrative   Married.  38 yo son.   Wife on disability for bipolar disorder.     Social Determinants of Health   Financial Resource Strain: Not on file  Food Insecurity: Not on file  Transportation Needs: Not on file  Physical Activity: Not on file  Stress: Not on file  Social Connections: Not on file  Intimate Partner Violence: Not on file      Review of Systems  Vital Signs: BP (!) 164/90    Pulse 70    Temp 99 F (37.2 C)    Resp 16    Ht 6\' 2"  (1.88 m)    Wt (!) 327 lb 12.8 oz (148.7 kg)    SpO2 98%    BMI 42.09 kg/m    Physical Exam     Assessment/Plan:   General Counseling: Marx verbalizes understanding of the findings of todays visit and agrees with plan of treatment. I have discussed any further diagnostic evaluation that may be needed or ordered today. We also reviewed his medications today. he has been encouraged to call the office with any questions or concerns that should arise related to todays visit.    Orders Placed This Encounter  Procedures   EKG 12-Lead    Meds ordered this encounter  Medications   DISCONTD: lisinopril-hydrochlorothiazide (ZESTORETIC) 20-12.5 MG tablet    Sig: Take 2 tablets by mouth daily.    Dispense:  60 tablet    Refill:  2    Note dose change discontinue previous order, pill changed as well as # of tablets. Fill immediately    Return in about 2 weeks  (around 03/12/2021) for F/U, BP check with lauren or Mann Skag[place].   Total time spent:30 Minutes Time spent includes review of chart, medications, test results, and follow up plan with the patient.   [place] Controlled Substance Database was reviewed by me.  This patient was seen by Jonetta Osgood, FNP-C in collaboration with Dr. Clayborn Bigness as a part of collaborative care agreement.   Tiana Sivertson R. Valetta Fuller, MSN, FNP-C Internal medicine

## 2021-02-26 NOTE — Progress Notes (Signed)
[facility] [place], [place] [address]  Internal MEDICINE  Office Visit Note  Patient Name: Gerald [name]  [id number]  [id number]  Date of Service: 02/26/2021  Chief Complaint  Patient presents with   Acute Visit    Pt feels cold    Hypertension   Fatigue   Headache    Pain radiates into upper back      HPI Gerald [name] presents for an acute sick visit for generalized weakness and not feeling well. Gerald [name] reports left shoulder pain that radiates to Gerald [name] back and neck. Gerald [name] is also having pain in the back of Gerald [name] head. EKG was done in office today and was normal. Gerald [name] blood pressure is significantly elevated at 170/92 today. Gerald [name] blood pressure was also elevated at home. Gerald [name] is accompanied by Gerald [name] wife to this office visit today. Gerald [name] is currently taking lisinopril-HCTZ 20-25 mg once daily. Gerald [name] has also been on carvedilol, metoprolol, amlodipine and furosemide in the past.     Current Medication:  Outpatient Encounter Medications as of 02/26/2021  Medication Sig   buPROPion (WELLBUTRIN XL) 150 MG 24 hr tablet Take 1 tablet (150 mg total) by mouth daily.   citalopram (CELEXA) 40 MG tablet TAKE 1 TABLET BY MOUTH EVERY DAY FOR GAD   gabapentin (NEURONTIN) 100 MG capsule Take 2 capsules (200 mg total) by mouth 3 (three) times daily.   insulin glargine (LANTUS) 100 UNIT/ML injection Inject 62 Units into the skin daily.   Multiple Vitamins-Minerals (MULTIVITAMIN ADULTS PO) Take by mouth.   nitroGLYCERIN (NITROSTAT) 0.4 MG SL tablet Place 1 tablet (0.4 mg total) under the tongue every 5 (five) minutes x 3 doses as needed for chest pain.   oxyCODONE-acetaminophen (PERCOCET) 5-325 MG tablet Take 1-2 tablets by mouth every 6 (six) hours as needed for severe pain. Max 6 tabs per day   [DISCONTINUED] lisinopril (ZESTRIL) 5 MG tablet Take 5 mg by mouth daily.   [DISCONTINUED] lisinopril-hydrochlorothiazide (ZESTORETIC) 20-12.5 MG tablet Take 2 tablets by mouth daily.   [DISCONTINUED]  lisinopril-hydrochlorothiazide (ZESTORETIC) 20-25 MG tablet Take 1 tablet by mouth daily.   No facility-administered encounter medications on file as of 02/26/2021.      Medical History: Past Medical History:  Diagnosis Date   Abnormal LFTs    Carcinoma ([place])    Diabetes mellitus without complication ([place])    borderline (11/14/20 - lost weight - no current issues)   Fundic gland polyps of stomach, benign    Gastritis    GERD (gastroesophageal reflux disease)    Hepatic steatosis    Hyperlipidemia    Hypertension    Nonischemic cardiomyopathy (HCC)    Previous ejection fraction of 35-40% on echo. 50% on cardiac catheterization in August of 2013   Obesity    Sleep apnea    Squamous cell carcinoma of skin 02/27/2020   left distal lat deltoid - [facility]     Vital Signs: BP (!) 164/90    Pulse 70    Temp 99 F (37.2 C)    Resp 16    Ht 6\' 2"  (1.88 m)    Wt (!) 327 lb 12.8 oz (148.7 kg)    SpO2 98%    BMI 42.09 kg/m    Review of Systems  Constitutional:  Negative for chills, fatigue and unexpected weight change.  HENT:  Negative for congestion, rhinorrhea, sneezing and sore throat.   Eyes:  Negative for redness.  Respiratory: Negative.  Negative for cough, chest tightness, shortness of breath  and wheezing.   Cardiovascular:  Positive for chest pain (radiating from the left shoulder). Negative for palpitations and leg swelling.  Gastrointestinal:  Negative for abdominal pain, constipation, diarrhea, nausea and vomiting.  Genitourinary:  Negative for dysuria and frequency.  Musculoskeletal:  Positive for arthralgias, back pain and neck pain. Negative for joint swelling.  Skin:  Negative for rash.  Neurological:  Positive for headaches. Negative for tremors and numbness.  Hematological:  Negative for adenopathy. Does not bruise/bleed easily.  Psychiatric/Behavioral:  Negative for behavioral problems (Depression), sleep disturbance and suicidal ideas. The patient is not  nervous/anxious.    Physical Exam Vitals reviewed.  Constitutional:      General: Gerald [name] is not in acute distress.    Appearance: Normal appearance. Gerald [name] is obese. Gerald [name] is not ill-appearing.  HENT:     Head: Normocephalic and atraumatic.  Eyes:     Pupils: Pupils are equal, round, and reactive to light.  Cardiovascular:     Rate and Rhythm: Normal rate and regular rhythm.     Heart sounds: Normal heart sounds. No murmur heard. Pulmonary:     Effort: Pulmonary effort is normal. No respiratory distress.     Breath sounds: Normal breath sounds. No wheezing.  Neurological:     Mental Status: Gerald [name] is alert and oriented to person, place, and time.     Cranial Nerves: No cranial nerve deficit.     Coordination: Coordination normal.     Gait: Gait normal.  Psychiatric:        Mood and Affect: Mood normal.        Behavior: Behavior normal.      Assessment/Plan:   General Counseling: [name] understanding of the findings of todays visit and agrees with plan of treatment. I have discussed any further diagnostic evaluation that may be needed or ordered today. We also reviewed Gerald [name] medications today. Gerald [name] has been encouraged to call the office with any questions or concerns that should arise related to todays visit.    Counseling: 1. Atypical chest pain EKG was normal. Has prior prescription for nitroglycerin tablets for angina. Does not see a cardiologist at this time. Patient will have a follow up visit in a few weeks. If the chest pain comes back, gets worse or starts happening more frequently, Gerald [name] knows to call the clinic or go to ER or urgent care if the pain is severe or Gerald [name] has difficulty breathing.  - EKG 12-Lead  2. Essential hypertension Blood pressure has been elevated even with Gerald [name] blood pressure medication, dose changed to lisinopril-HCTZ 20-12.5 mg 2 tablets daily. Follow up in 4 weeks.   3. Morbid obesity with BMI of 40.0-44.9, adult [facility]) Patient has morbid obesity which  can cause stress on the back, joints and other areas of the body causing pain. Gerald [name] also has a diagnosis of chronic pain syndrome and anxiety.   4. Generalized anxiety disorder Elevated levels of anxiety can sometimes manifest as chest pain and even pain in other areas of the body.     Orders Placed This Encounter  Procedures   EKG 12-Lead    Meds ordered this encounter  Medications   DISCONTD: lisinopril-hydrochlorothiazide (ZESTORETIC) 20-12.5 MG tablet    Sig: Take 2 tablets by mouth daily.    Dispense:  60 tablet    Refill:  2    Note dose change discontinue previous order, pill changed as well as # of tablets. Fill immediately    Return in about 2 weeks (around 03/12/2021)  for F/U, BP check with lauren or Visente Kirker.  [place] Controlled Substance Database was reviewed by me for overdose risk score (ORS)  Time spent:30 Minutes Time spent with patient included reviewing progress notes, labs, imaging studies, and discussing plan for follow up.   This patient was seen by Jonetta Osgood, FNP-C in collaboration with Dr. Clayborn Bigness as a part of collaborative care agreement.  Tobe Kervin R. Valetta Fuller, MSN, FNP-C Internal Medicine

## 2021-02-28 ENCOUNTER — Emergency Department: Payer: 59

## 2021-02-28 ENCOUNTER — Other Ambulatory Visit: Payer: Self-pay

## 2021-02-28 ENCOUNTER — Emergency Department
Admission: EM | Admit: 2021-02-28 | Discharge: 2021-02-28 | Disposition: A | Discharge status: Home / Self Care | Payer: 59 | Attending: Emergency Medicine | Admitting: Emergency Medicine

## 2021-02-28 DIAGNOSIS — E119 Type 2 diabetes mellitus without complications: Secondary | ICD-10-CM | POA: Insufficient documentation

## 2021-02-28 DIAGNOSIS — R1012 Left upper quadrant pain: Secondary | ICD-10-CM

## 2021-02-28 DIAGNOSIS — I10 Essential (primary) hypertension: Secondary | ICD-10-CM | POA: Insufficient documentation

## 2021-02-28 DIAGNOSIS — R079 Chest pain, unspecified: Secondary | ICD-10-CM | POA: Diagnosis present

## 2021-02-28 DIAGNOSIS — K760 Fatty (change of) liver, not elsewhere classified: Secondary | ICD-10-CM

## 2021-02-28 DIAGNOSIS — K4020 Bilateral inguinal hernia, without obstruction or gangrene, not specified as recurrent: Secondary | ICD-10-CM

## 2021-02-28 DIAGNOSIS — R1032 Left lower quadrant pain: Secondary | ICD-10-CM | POA: Diagnosis not present

## 2021-02-28 LAB — HEPATIC FUNCTION PANEL
ALT: 29 U/L (ref 0–44)
AST: 24 U/L (ref 15–41)
Albumin: 3.7 g/dL (ref 3.5–5.0)
Alkaline Phosphatase: 69 U/L (ref 38–126)
Bilirubin, Direct: <0.1 mg/dL (ref 0.0–0.2)
Total Bilirubin: 0.7 mg/dL (ref 0.3–1.2)
Total Protein: 6.8 g/dL (ref 6.5–8.1)

## 2021-02-28 LAB — CBC
HCT: 43.1 % (ref 39.0–52.0)
Hemoglobin: 14.6 g/dL (ref 13.0–17.0)
MCH: 29.9 pg (ref 26.0–34.0)
MCHC: 33.9 g/dL (ref 30.0–36.0)
MCV: 88.3 fL (ref 80.0–100.0)
Platelets: 232 10*3/uL (ref 150–400)
RBC: 4.88 MIL/uL (ref 4.22–5.81)
RDW: 13.4 % (ref 11.5–15.5)
WBC: 9.6 10*3/uL (ref 4.0–10.5)
nRBC: 0 % (ref 0.0–0.2)

## 2021-02-28 LAB — BASIC METABOLIC PANEL
Anion gap: 9 (ref 5–15)
BUN: 18 mg/dL (ref 6–20)
CO2: 29 mmol/L (ref 22–32)
Calcium: 9.1 mg/dL (ref 8.9–10.3)
Chloride: 98 mmol/L (ref 98–111)
Creatinine, Ser: 1.12 mg/dL (ref 0.61–1.24)
GFR, Estimated: >60 mL/min (ref >60)
Glucose, Bld: 155 mg/dL — ABNORMAL HIGH (ref 70–99)
Potassium: 3.7 mmol/L (ref 3.5–5.1)
Sodium: 136 mmol/L (ref 135–145)

## 2021-02-28 LAB — TROPONIN I (HIGH SENSITIVITY)
Troponin I (High Sensitivity): 5 ng/L (ref <18)
Troponin I (High Sensitivity): 5 ng/L (ref <18)

## 2021-02-28 LAB — LIPASE, BLOOD: Lipase: 22 U/L (ref 11–51)

## 2021-02-28 MED ORDER — MORPHINE SULFATE (PF) 4 MG/ML IV SOLN
6.0000 mg | Freq: Once | INTRAVENOUS | Status: AC
  Administered 2021-02-28: 6 mg via INTRAVENOUS
  Filled 2021-02-28: qty 2

## 2021-02-28 MED ORDER — HYDROCODONE-ACETAMINOPHEN 5-325 MG PO TABS
1.0000 | ORAL_TABLET | Freq: Once | ORAL | Status: AC
  Administered 2021-02-28: 1 via ORAL
  Filled 2021-02-28: qty 1

## 2021-02-28 MED ORDER — IOHEXOL 300 MG/ML  SOLN
100.0000 mL | Freq: Once | INTRAMUSCULAR | Status: AC | PRN
  Administered 2021-02-28: 100 mL via INTRAVENOUS
  Filled 2021-02-28: qty 100

## 2021-02-28 NOTE — ED Provider Notes (Signed)
[facility] Provider Note    Event Date/Time   First MD Initiated Contact with Patient 02/28/21 1833     (approximate)   History   Chest Pain and Abdominal Pain   HPI  Gerald [name] is a 56 y.o. male with past medical history of HTN, DM, GERD, nonischemic cardiomyopathy, OSA, obesity and gastritis who presents accompanied by wife for assessment of some fairly acute left-sided flank pain that started this morning radiating up to his epigastrium into his chest.  Patient states "feels like he got kicked in the kidney".  He denies any preceding trauma or injuries.  He did have some diaphoresis but no measured fevers.  No vomiting, diarrhea, constipation, urinary symptoms, cough, right-sided abdominal pain, upper back pain, headache or earache, sore throat or any other clear associated sick symptoms.  He has never had a kidney stone diverticulitis or gastritis.  He does not take NSAIDs or drink alcohol regularly.  No other acute concerns at this time.      Physical Exam  Triage Vital Signs: ED Triage Vitals  Enc Vitals Group     BP 02/28/21 1717 140/84     Pulse Rate 02/28/21 1717 74     Resp 02/28/21 1717 20     Temp 02/28/21 1717 98 F (36.7 C)     Temp Source 02/28/21 1717 Oral     SpO2 02/28/21 1717 96 %     Weight 02/28/21 1715 (!) 328 lb 7.8 oz (149 kg)     Height 02/28/21 1715 6\' 2"  (1.88 m)     Head Circumference --      Peak Flow --      Pain Score 02/28/21 1715 9     Pain Loc --      Pain Edu? --      Excl. in [place]? --     Most recent vital signs: Vitals:   02/28/21 1717 02/28/21 1900  BP: 140/84 129/74  Pulse: 74 66  Resp: 20 (!) 22  Temp: 98 F (36.7 C)   SpO2: 96% 94%    General: Awake, no distress.  CV:  Good peripheral perfusion.  Resp:  Normal effort.  Clear bilaterally Abd:  No distention.  Tender in the left upper quadrant and left flank area.  Remainder of abdomen is nontender. Other:  No significant CVA  tenderness.   ED Results / Procedures / Treatments  Labs (all labs ordered are listed, but only abnormal results are displayed) Labs Reviewed  BASIC METABOLIC PANEL - Abnormal; Notable for the following components:      Result Value   Glucose, Bld 155 (*)    All other components within normal limits  CBC  HEPATIC FUNCTION PANEL  LIPASE, BLOOD  TROPONIN I (HIGH SENSITIVITY)  TROPONIN I (HIGH SENSITIVITY)  TROPONIN I (HIGH SENSITIVITY)     EKG  ECG is remarkable for sinus rhythm with a ventricular rate of 74 without evidence of acute anemia or significant arrhythmia.  Unremarkable intervals.   RADIOLOGY Chest reviewed by myself shows no focal consoidation, effusion, edema, pneumothorax or other clear acute thoracic process. I also reviewed radiology interpretation and agree with findings described.    PROCEDURES:  Critical Care performed: No  Procedures    MEDICATIONS ORDERED IN ED: Medications  morphine 4 MG/ML injection 6 mg (6 mg Intravenous Given 02/28/21 1858)  iohexol (OMNIPAQUE) 300 MG/ML solution 100 mL (100 mLs Intravenous Contrast Given 02/28/21 1934)     IMPRESSION / MDM /  ASSESSMENT AND PLAN / ED COURSE  I reviewed the triage vital signs and the nursing notes.                              Differential diagnosis includes, but is not limited to, PNA, AAA, kidney stone, ACS, gastritis, diverticulitis, and GERD.  Chest reviewed by myself shows no focal consoidation, effusion, edema, pneumothorax or other clear acute thoracic process. I also reviewed radiology interpretation and agree with findings described.  ECG is remarkable for sinus rhythm with a ventricular rate of 74 without evidence of acute anemia or significant arrhythmia.  Unremarkable intervals.  On initial troponin is reassuring alternate delta troponin to rule out ACS at this time.  CBC without leukocytosis or acute anemia. BMP without any significant electrolyte or metabolic derangements.   Given absence of fever, cough, focal consolidation on chest x-ray or leukocytosis have a low suspicion for acute bacterial pneumonia or other clear acute infectious process.   Care patient signed over to assuming father at approximately 7:40 PM.  Plan is to follow-up remaining labs including a lipase, hepatic function panel, repeat troponin and CT abdomen pelvis.  If these are all normal I think patient will be stable for discharge with continued outpatient evaluation.      FINAL CLINICAL IMPRESSION(S) / ED DIAGNOSES   Final diagnoses:  Chest pain, unspecified type  Left upper quadrant abdominal pain     Rx / DC Orders   ED Discharge Orders     None        Note:  This document was prepared using Dragon voice recognition software and may include unintentional dictation errors.   Lucrezia Starch, MD 02/28/21 [phone]

## 2021-02-28 NOTE — ED Notes (Signed)
Pt discharge information reviewed. Pt understands need for follow up care and when to return if symptoms worsen. All questions answered. Pt is alert and oriented with even and regular respirations. Pt is seen ambulating out of department with string steady gait with family.  °

## 2021-02-28 NOTE — ED Triage Notes (Signed)
Pt to ED for upper abd pain that started this  morning now radiating to chest . +shob. +nausea.  Describes sharp pain

## 2021-02-28 NOTE — Discharge Instructions (Addendum)
Please use ibuprofen/naproxen/Tylenol for any continued pain Please follow-up with gastroenterology soon as possible for further evaluation

## 2021-03-11 ENCOUNTER — Ambulatory Visit: Payer: 59 | Admitting: Physician Assistant

## 2021-03-11 ENCOUNTER — Encounter: Payer: Self-pay | Admitting: Physician Assistant

## 2021-03-11 ENCOUNTER — Other Ambulatory Visit: Payer: Self-pay

## 2021-03-11 VITALS — BP 149/83 | HR 61 | Temp 98.3°F | Resp 16 | Ht 74.0 in | Wt 319.0 lb

## 2021-03-11 DIAGNOSIS — K760 Fatty (change of) liver, not elsewhere classified: Secondary | ICD-10-CM | POA: Diagnosis not present

## 2021-03-11 DIAGNOSIS — K429 Umbilical hernia without obstruction or gangrene: Secondary | ICD-10-CM | POA: Diagnosis not present

## 2021-03-11 DIAGNOSIS — K4020 Bilateral inguinal hernia, without obstruction or gangrene, not specified as recurrent: Secondary | ICD-10-CM | POA: Diagnosis not present

## 2021-03-11 DIAGNOSIS — I10 Essential (primary) hypertension: Secondary | ICD-10-CM

## 2021-03-11 NOTE — Progress Notes (Signed)
[facility] [place], [place] [address]  Internal MEDICINE  Office Visit Note  Patient Name: Gerald [name]  [id number]  [id number]  Date of Service: 03/13/2021  Chief Complaint  Patient presents with   Follow-up   Diabetes   Hyperlipidemia   Hypertension   Gastroesophageal Reflux   Quality Metric Gaps    Shingles Vaccine and Foot Exam    HPI Pt is here for routine follow up for elevated BP -Patient actually went to ED on 02/28/21 due to left side flank pain radiating up toward chest. His EKG and labs were overall reassuring and a CT abd and pelvis was ordered which incidentally found hepatic steatosis and bilaterally inguinal hernias plus umbilical hernia. He was referred to GI with Dr. Vicente Males. Has appt on the 27th -reports pain has been better and denies any urinary symptoms. Denies any CP or SOB. -BP has been ok -has dropped weight again now that foot is doing better -138/78 -He was not sure if he should be taking two of the newly adjust BP medication and therefore has just been taking 1 still and his BP has been controlled at home and again in office today. He will continue with 1 tab for now unless rising again in which case will go up to 2 tabs for a total of 40mg  lisinopril and 25mg  HCTZ.  Current Medication: Outpatient Encounter Medications as of 03/11/2021  Medication Sig   buPROPion (WELLBUTRIN XL) 150 MG 24 hr tablet Take 1 tablet (150 mg total) by mouth daily.   citalopram (CELEXA) 40 MG tablet TAKE 1 TABLET BY MOUTH EVERY DAY FOR GAD   gabapentin (NEURONTIN) 100 MG capsule Take 2 capsules (200 mg total) by mouth 3 (three) times daily.   insulin glargine (LANTUS) 100 UNIT/ML injection Inject 62 Units into the skin daily.   Multiple Vitamins-Minerals (MULTIVITAMIN ADULTS PO) Take by mouth.   nitroGLYCERIN (NITROSTAT) 0.4 MG SL tablet Place 1 tablet (0.4 mg total) under the tongue every 5 (five) minutes x 3 doses as needed for chest pain.    oxyCODONE-acetaminophen (PERCOCET) 5-325 MG tablet Take 1-2 tablets by mouth every 6 (six) hours as needed for severe pain. Max 6 tabs per day   [DISCONTINUED] lisinopril-hydrochlorothiazide (ZESTORETIC) 20-12.5 MG tablet Take 2 tablets by mouth daily.   No facility-administered encounter medications on file as of 03/11/2021.    Surgical History: Past Surgical History:  Procedure Laterality Date   BONE EXCISION Right 11/21/2020   Procedure: PART EXCISION BONE- PHALANX;  Surgeon: Samara Deist, DPM;  Location: [place];  Service: Podiatry;  Laterality: Right;   CARDIAC CATHETERIZATION N/A 09/2011   [facility]; EF 50% with 30% mid LAD stenosis and no obstructive disease.   CARDIAC CATHETERIZATION  09/2010   [facility]; Mid LAD 40% stenosis; Mid Circumflex:Normal; Mid RCA; Normal   CARDIAC CATHETERIZATION     COLONOSCOPY     ESOPHAGOGASTRODUODENOSCOPY (EGD) WITH PROPOFOL N/A 06/10/2017   Procedure: ESOPHAGOGASTRODUODENOSCOPY (EGD) WITH PROPOFOL;  Surgeon: Jonathon Bellows, MD;  Location: [facility] ENDOSCOPY;  Service: Gastroenterology;  Laterality: N/A;   HAMMER TOE SURGERY Right 11/21/2020   Procedure: HAMMERTOE CORRECTION;  Surgeon: Samara Deist, DPM;  Location: [place];  Service: Podiatry;  Laterality: Right;   KNEE ARTHROSCOPY WITH MEDIAL MENISECTOMY Right 09/16/2012   Procedure: RIGHT KNEE ARTHROSCOPY WITH MEDIAL AND LATERAL MENISECTOMY, CHONDROPLASTY;  Surgeon: Ninetta Lights, MD;  Location: [place];  Service: Orthopedics;  Laterality: Right;  RIGHT KNEE SCOPE MEDIAL MENISCECTOMY  LEFT HEART CATH AND CORONARY ANGIOGRAPHY N/A 06/11/2016   Procedure: Left Heart Cath and Coronary Angiography;  Surgeon: Minna Merritts, MD;  Location: [place] CV LAB;  Service: Cardiovascular;  Laterality: N/A;    Medical History: Past Medical History:  Diagnosis Date   Abnormal LFTs    Carcinoma ([place])    Diabetes mellitus without complication ([place])    borderline (11/14/20 - lost  weight - no current issues)   Fundic gland polyps of stomach, benign    Gastritis    GERD (gastroesophageal reflux disease)    Hepatic steatosis    Hyperlipidemia    Hypertension    Nonischemic cardiomyopathy (HCC)    Previous ejection fraction of 35-40% on echo. 50% on cardiac catheterization in August of 2013   Obesity    Sleep apnea    Squamous cell carcinoma of skin 02/27/2020   left distal lat deltoid - [facility]    Family History: Family History  Problem Relation Age of Onset   Heart disease Father 27       MI   Heart attack Father    Stomach cancer Father    Hypertension Mother    Breast cancer Mother     Social History   Socioeconomic History   Marital status: Married    Spouse name: Not on file   Number of children: 3   Years of education: Not on file   Highest education level: Not on file  Occupational History   Occupation: Librarian, academic  Tobacco Use   Smoking status: Never   Smokeless tobacco: Former    Quit date: 1987  Scientific laboratory technician Use: Never used  Substance and Sexual Activity   Alcohol use: Not Currently    Alcohol/week: 0.0 standard drinks    Comment: rare   Drug use: No   Sexual activity: Not on file  Other Topics Concern   Not on file  Social History Narrative   Married.  24 yo son.   Wife on disability for bipolar disorder.     Social Determinants of Health   Financial Resource Strain: Not on file  Food Insecurity: Not on file  Transportation Needs: Not on file  Physical Activity: Not on file  Stress: Not on file  Social Connections: Not on file  Intimate Partner Violence: Not on file      Review of Systems  Constitutional:  Negative for chills, fatigue and unexpected weight change.  HENT:  Negative for congestion, postnasal drip, rhinorrhea, sneezing and sore throat.   Eyes:  Negative for redness.  Respiratory:  Negative for cough, chest tightness and shortness of breath.   Cardiovascular:  Negative for chest pain and  palpitations.  Gastrointestinal:  Negative for abdominal pain, constipation, diarrhea, nausea and vomiting.  Genitourinary:  Negative for dysuria and frequency.  Musculoskeletal:  Positive for arthralgias. Negative for back pain, joint swelling and neck pain.  Skin:  Negative for rash.  Neurological:  Positive for numbness. Negative for tremors.  Hematological:  Negative for adenopathy. Does not bruise/bleed easily.  Psychiatric/Behavioral:  Positive for sleep disturbance. Negative for behavioral problems (Depression) and suicidal ideas. The patient is not nervous/anxious.    Vital Signs: BP (!) 149/83    Pulse 61    Temp 98.3 F (36.8 C)    Resp 16    Ht 6\' 2"  (1.88 m)    Wt (!) 319 lb (144.7 kg)    SpO2 96%    BMI 40.96 kg/m  Physical Exam Vitals and nursing note reviewed.  Constitutional:      General: He is not in acute distress.    Appearance: He is well-developed. He is obese. He is not diaphoretic.  HENT:     Head: Normocephalic and atraumatic.     Mouth/Throat:     Pharynx: No oropharyngeal exudate.  Eyes:     Pupils: Pupils are equal, round, and reactive to light.  Neck:     Thyroid: No thyromegaly.     Vascular: No JVD.     Trachea: No tracheal deviation.  Cardiovascular:     Rate and Rhythm: Normal rate and regular rhythm.     Heart sounds: Normal heart sounds. No murmur heard.   No friction rub. No gallop.  Pulmonary:     Effort: Pulmonary effort is normal. No respiratory distress.     Breath sounds: No wheezing or rales.  Chest:     Chest wall: No tenderness.  Abdominal:     General: Bowel sounds are normal.     Palpations: Abdomen is soft.  Musculoskeletal:        General: Normal range of motion.     Cervical back: Normal range of motion and neck supple.  Lymphadenopathy:     Cervical: No cervical adenopathy.  Skin:    General: Skin is warm and dry.  Neurological:     Mental Status: He is alert and oriented to person, place, and time.     Cranial  Nerves: No cranial nerve deficit.  Psychiatric:        Behavior: Behavior normal.        Thought Content: Thought content normal.        Judgment: Judgment normal.       Assessment/Plan: 1. Essential hypertension BP stable, will continue with single tab lisinopril-hctz and monitor. May double in future if rising  2. Bilateral inguinal hernia without obstruction or gangrene, recurrence not specified Seen on Ct from ED, has upcomming visit with GI  3. Hepatic steatosis Seen on Ct from ED, has upcomming visit with GI  4. Umbilical hernia without obstruction and without gangrene Seen on Ct from ED, has upcomming visit with GI   General Counseling: [name] understanding of the findings of todays visit and agrees with plan of treatment. I have discussed any further diagnostic evaluation that may be needed or ordered today. We also reviewed his medications today. he has been encouraged to call the office with any questions or concerns that should arise related to todays visit.    No orders of the defined types were placed in this encounter.   No orders of the defined types were placed in this encounter.   This patient was seen by Drema Dallas, PA-C in collaboration with Dr. Clayborn Bigness as a part of collaborative care agreement.   Total time spent:30 Minutes Time spent includes review of chart, medications, test results, and follow up plan with the patient.      Dr Lavera Guise Internal medicine

## 2021-03-24 ENCOUNTER — Encounter: Payer: Self-pay | Admitting: Nurse Practitioner

## 2021-04-01 ENCOUNTER — Other Ambulatory Visit: Payer: Self-pay

## 2021-04-01 ENCOUNTER — Ambulatory Visit (INDEPENDENT_AMBULATORY_CARE_PROVIDER_SITE_OTHER): Payer: 59 | Admitting: Gastroenterology

## 2021-04-01 ENCOUNTER — Encounter: Payer: Self-pay | Admitting: Gastroenterology

## 2021-04-01 VITALS — BP 123/77 | HR 68 | Temp 98.3°F | Ht 74.0 in | Wt 314.0 lb

## 2021-04-01 DIAGNOSIS — K7460 Unspecified cirrhosis of liver: Secondary | ICD-10-CM | POA: Diagnosis not present

## 2021-04-01 DIAGNOSIS — I8500 Esophageal varices without bleeding: Secondary | ICD-10-CM

## 2021-04-01 DIAGNOSIS — K4020 Bilateral inguinal hernia, without obstruction or gangrene, not specified as recurrent: Secondary | ICD-10-CM | POA: Diagnosis not present

## 2021-04-01 DIAGNOSIS — Z23 Encounter for immunization: Secondary | ICD-10-CM

## 2021-04-01 DIAGNOSIS — Z1211 Encounter for screening for malignant neoplasm of colon: Secondary | ICD-10-CM

## 2021-04-01 MED ORDER — NA SULFATE-K SULFATE-MG SULF 17.5-3.13-1.6 GM/177ML PO SOLN: 354.0000 mL | Freq: Once | ORAL | 0 refills | Status: AC

## 2021-04-01 NOTE — Progress Notes (Signed)
Jonathon Bellows MD, MRCP(U.K) [address]  [place]  [place], [place] [address]  Main: [phone]  Fax: [phone]   Gastroenterology Consultation  Referring Provider:     Lavera Guise, MD Primary Care Physician:  Lavera Guise, MD Primary Gastroenterologist:  Dr. Jonathon Bellows  Reason for Consultation:  ER visit for abdominal pain        HPI:   Gerald [name] is a 56 y.o. y/o male referred for consultation & management  by Dr. Humphrey Rolls, Timoteo Gaul, MD.     He presented to the ER on 02/28/2021 with left sided flank pain , radiating to the epigastrium and chest . Underwent CT abdomen and pelvis and showed concern for hepatic steatosis, inguinal hernias and no other cute cause for abdominal pain. Discharged   02/28/2021:  LFT's,CBC  normal  He states that he has had this pain in the left lower quadrant for a few months if not longer.  Worse when he strains, when having a bowel movement, when sitting up feels like a deep-seated pain then resolves on its own.  Has not felt a bulge in that area.  No family history of liver disease.  Last colonoscopy was over 10 to 15 years back.  Has intentionally been trying to lose weight.  No other complaints .  He has had 1 professional tattoo 4 years back.  No incarceration, no Armed forces logistics/support/administrative officer.  No illegal drug use.  No excess alcohol consumption.  Past Medical History:  Diagnosis Date   Abnormal LFTs    Carcinoma ([place])    Diabetes mellitus without complication ([place])    borderline (11/14/20 - lost weight - no current issues)   Fundic gland polyps of stomach, benign    Gastritis    GERD (gastroesophageal reflux disease)    Hepatic steatosis    Hyperlipidemia    Hypertension    Nonischemic cardiomyopathy (HCC)    Previous ejection fraction of 35-40% on echo. 50% on cardiac catheterization in August of 2013   Obesity    Sleep apnea    Squamous cell carcinoma of skin 02/27/2020   left distal lat deltoid - [facility]    Past Surgical History:   Procedure Laterality Date   BONE EXCISION Right 11/21/2020   Procedure: PART EXCISION BONE- PHALANX;  Surgeon: Samara Deist, DPM;  Location: [place];  Service: Podiatry;  Laterality: Right;   CARDIAC CATHETERIZATION N/A 09/2011   [facility]; EF 50% with 30% mid LAD stenosis and no obstructive disease.   CARDIAC CATHETERIZATION  09/2010   [facility]; Mid LAD 40% stenosis; Mid Circumflex:Normal; Mid RCA; Normal   CARDIAC CATHETERIZATION     COLONOSCOPY     ESOPHAGOGASTRODUODENOSCOPY (EGD) WITH PROPOFOL N/A 06/10/2017   Procedure: ESOPHAGOGASTRODUODENOSCOPY (EGD) WITH PROPOFOL;  Surgeon: Jonathon Bellows, MD;  Location: [facility] ENDOSCOPY;  Service: Gastroenterology;  Laterality: N/A;   HAMMER TOE SURGERY Right 11/21/2020   Procedure: HAMMERTOE CORRECTION;  Surgeon: Samara Deist, DPM;  Location: [place];  Service: Podiatry;  Laterality: Right;   KNEE ARTHROSCOPY WITH MEDIAL MENISECTOMY Right 09/16/2012   Procedure: RIGHT KNEE ARTHROSCOPY WITH MEDIAL AND LATERAL MENISECTOMY, CHONDROPLASTY;  Surgeon: Ninetta Lights, MD;  Location: [place];  Service: Orthopedics;  Laterality: Right;  RIGHT KNEE SCOPE MEDIAL MENISCECTOMY   LEFT HEART CATH AND CORONARY ANGIOGRAPHY N/A 06/11/2016   Procedure: Left Heart Cath and Coronary Angiography;  Surgeon: Minna Merritts, MD;  Location: [place] CV LAB;  Service: Cardiovascular;  Laterality:  N/A;    Prior to Admission medications   Medication Sig Start Date End Date Taking? Authorizing Provider  buPROPion (WELLBUTRIN XL) 150 MG 24 hr tablet Take 1 tablet (150 mg total) by mouth daily. 01/17/21   McDonough, Si Gaul, PA-C  citalopram (CELEXA) 40 MG tablet TAKE 1 TABLET BY MOUTH EVERY DAY FOR GAD 01/17/21   McDonough, Si Gaul, PA-C  gabapentin (NEURONTIN) 100 MG capsule Take by mouth.    [provider]  insulin glargine (LANTUS) 100 UNIT/ML injection Inject 62 Units into the skin daily.    [provider]   lisinopril-hydrochlorothiazide (ZESTORETIC) 20-25 MG tablet Take 1 tablet by mouth in the morning and at bedtime. 12/05/18   [provider]  Multiple Vitamins-Minerals (MULTIVITAMIN ADULTS PO) Take by mouth.    [provider]  nitroGLYCERIN (NITROSTAT) 0.4 MG SL tablet Place 1 tablet (0.4 mg total) under the tongue every 5 (five) minutes x 3 doses as needed for chest pain. 01/09/18   Fritzi Mandes, MD  oxyCODONE-acetaminophen (PERCOCET) 5-325 MG tablet Take 1-2 tablets by mouth every 6 (six) hours as needed for severe pain. Max 6 tabs per day 11/21/20   Samara Deist, DPM    Family History  Problem Relation Age of Onset   Heart disease Father 65       MI   Heart attack Father    Stomach cancer Father    Hypertension Mother    Breast cancer Mother      Social History   Tobacco Use   Smoking status: Never   Smokeless tobacco: Former    Quit date: 1987  Vaping Use   Vaping Use: Never used  Substance Use Topics   Alcohol use: Not Currently    Alcohol/week: 0.0 standard drinks    Comment: rare   Drug use: No    Allergies as of 04/01/2021 - Review Complete 04/01/2021  Allergen Reaction Noted   Statins Rash 04/15/2012    Review of Systems:    All systems reviewed and negative except where noted in HPI.   Physical Exam:  BP 123/77    Pulse 68    Temp 98.3 F (36.8 C) (Oral)    Ht 6\' 2"  (1.88 m)    Wt (!) 314 lb (142.4 kg)    BMI 40.32 kg/m  No LMP for male patient. Psych:  Alert and cooperative. Normal mood and affect. General:   Alert,  Well-developed, well-nourished, pleasant and cooperative in NAD Head:  Normocephalic and atraumatic. Eyes:  Sclera clear, no icterus.   Conjunctiva pink. Ears:  Normal auditory acuity. Nose:  No deformity, discharge, or lesions.  Lungs:  Respirations even and unlabored.  Clear throughout to auscultation.   No wheezes, crackles, or rhonchi. No acute distress. Heart:  Regular rate and rhythm; no murmurs, clicks, rubs, or  gallops. Abdomen:  Normal bowel sounds.  No bruits.  Soft, non-tender and non-distended without masses, hepatosplenomegaly or hernias noted.  Tenderness over the left inguinal ligament particularly when he coughs I could not truly feel a clear hernia. Neurologic:  Alert and oriented x3;  grossly normal neurologically. Psych:  Alert and cooperative. Normal mood and affect.  Imaging Studies: No results found.  Assessment and Plan:   Gerald [name] is a 56 y.o. y/o male has been referred for  abdominal pain after ER visit. CT abdomen showed inguinal hernia and features of liver cirrhosis.  His abdominal pain is definitely in the left inguinal area and is worse when he  coughs although I could not feel a clear bulge.  No risk factors for liver cirrhosis except possibly for fatty liver disease  Plan Inguinal hernias: refer to Dr Hampton Abbot for evaluation  Liver cirrhosis : Likely NASH : get full autoimmune and viral hepatitis work up , [employer]. EGD to screen for cirrhosis . INR check  RUQ USG in 08/2021 to screen for [facility] Weight loss, exercise, low salt diet, eat healthy 5.   EGD+colonoscopy  to screen for esophageal varices and colon cancer    I have discussed alternative options, risks & benefits,  which include, but are not limited to, bleeding, infection, perforation,respiratory complication & drug reaction.  The patient agrees with this plan & written consent will be obtained.    Follow up in 3 months  Dr Jonathon Bellows MD,MRCP(U.K)

## 2021-04-01 NOTE — Addendum Note (Signed)
Addended by: Wayna Chalet on: 04/01/2021 03:57 PM   Modules accepted: Orders

## 2021-04-02 LAB — NASH FIBROSURE

## 2021-04-04 NOTE — Progress Notes (Signed)
Thank you much!! It has been noted

## 2021-04-05 LAB — HEPATITIS C ANTIBODY: Hep C Virus Ab: NONREACTIVE

## 2021-04-05 LAB — NASH FIBROSURE
ALPHA 2-MACROGLOBULINS, QN: 133 mg/dL (ref 110–276)
ALT (SGPT) P5P: 38 IU/L (ref 0–55)
AST (SGOT) P5P: 41 IU/L — ABNORMAL HIGH (ref 0–40)
Apolipoprotein A-1: 126 mg/dL (ref 101–178)
Bilirubin, Total: 0.3 mg/dL (ref 0.0–1.2)
Cholesterol, Total: 205 mg/dL — ABNORMAL HIGH (ref 100–199)
Fibrosis Score: 0.09 (ref 0.00–0.21)
GGT: 35 IU/L (ref 0–65)
Glucose: 82 mg/dL (ref 70–99)
Haptoglobin: 223 mg/dL (ref 29–370)
Height: 74 in
NASH Score: 0.5 — ABNORMAL HIGH
Steatosis Score: 0.74 — ABNORMAL HIGH (ref 0.00–0.30)
Triglycerides: 239 mg/dL — ABNORMAL HIGH (ref 0–149)
Weight: 314 [lb_av]

## 2021-04-05 LAB — PROTIME-INR
INR: 1.1 (ref 0.9–1.2)
Prothrombin Time: 11.4 s (ref 9.1–12.0)

## 2021-04-05 LAB — IMMUNOGLOBULINS A/E/G/M, SERUM
IgA/Immunoglobulin A, Serum: 250 mg/dL (ref 90–386)
IgE (Immunoglobulin E), Serum: 11 IU/mL (ref 6–495)
IgG (Immunoglobin G), Serum: 994 mg/dL (ref 603–1613)
IgM (Immunoglobulin M), Srm: 57 mg/dL (ref 20–172)

## 2021-04-05 LAB — HEPATITIS B E ANTIBODY: Hep B E Ab: NEGATIVE

## 2021-04-05 LAB — CERULOPLASMIN: Ceruloplasmin: 25.3 mg/dL (ref 16.0–31.0)

## 2021-04-05 LAB — MITOCHONDRIAL/SMOOTH MUSCLE AB PNL
Mitochondrial Ab: <20 Units (ref 0.0–20.0)
Smooth Muscle Ab: 1 Units (ref 0–19)

## 2021-04-05 LAB — IRON,TIBC AND FERRITIN PANEL
Ferritin: 150 ng/mL (ref 30–400)
Iron Saturation: 19 % (ref 15–55)
Iron: 52 ug/dL (ref 38–169)
Total Iron Binding Capacity: 272 ug/dL (ref 250–450)
UIBC: 220 ug/dL (ref 111–343)

## 2021-04-05 LAB — ALPHA-1-ANTITRYPSIN: A-1 Antitrypsin: 85 mg/dL — ABNORMAL LOW (ref 101–187)

## 2021-04-05 LAB — HEPATITIS A ANTIBODY, TOTAL: hep A Total Ab: NEGATIVE

## 2021-04-05 LAB — HEPATITIS B E ANTIGEN: Hep B E Ag: NEGATIVE

## 2021-04-05 LAB — CELIAC DISEASE AB SCREEN W/RFX
Antigliadin Abs, IgA: 6 units (ref 0–19)
Transglutaminase IgA: <2 U/mL (ref 0–3)

## 2021-04-05 LAB — ANA: Anti Nuclear Antibody (ANA): NEGATIVE

## 2021-04-05 LAB — HEPATITIS B CORE ANTIBODY, TOTAL: Hep B Core Total Ab: NEGATIVE

## 2021-04-05 LAB — HIV ANTIBODY (ROUTINE TESTING W REFLEX): HIV Screen 4th Generation wRfx: NONREACTIVE

## 2021-04-05 LAB — ANTI-MICROSOMAL ANTIBODY LIVER / KIDNEY: LKM1 Ab: 1.5 Units (ref 0.0–20.0)

## 2021-04-05 LAB — HEPATITIS B SURFACE ANTIGEN: Hepatitis B Surface Ag: NEGATIVE

## 2021-04-05 LAB — HEPATITIS B SURFACE ANTIBODY,QUALITATIVE: Hep B Surface Ab, Qual: NONREACTIVE

## 2021-04-05 LAB — CK: Total CK: 1215 U/L (ref 41–331)

## 2021-04-05 NOTE — Progress Notes (Signed)
Needs Hep A/B vaccine

## 2021-04-08 ENCOUNTER — Ambulatory Visit: Payer: 59 | Admitting: Surgery

## 2021-04-08 ENCOUNTER — Encounter: Payer: Self-pay | Admitting: Surgery

## 2021-04-08 ENCOUNTER — Telehealth: Payer: Self-pay

## 2021-04-08 ENCOUNTER — Other Ambulatory Visit: Payer: Self-pay

## 2021-04-08 VITALS — BP 154/90 | HR 60 | Temp 98.2°F | Ht 74.0 in | Wt 314.0 lb

## 2021-04-08 DIAGNOSIS — K4020 Bilateral inguinal hernia, without obstruction or gangrene, not specified as recurrent: Secondary | ICD-10-CM

## 2021-04-08 DIAGNOSIS — K429 Umbilical hernia without obstruction or gangrene: Secondary | ICD-10-CM

## 2021-04-08 NOTE — H&P (View-Only) (Signed)
?04/08/2021 ? ?Reason for Visit:  Bilateral inguinal hernia, umbilical hernia ? ?History of Present Illness: ?Gerald [name] is a 56 y.o. male presenting for evaluation of bilateral inguinal hernia and umbilical hernia.  Patient presented to the ED on 02/28/21 with left abdominal pain.  The patient reports that the pain was in the left lower quadrant closer to the left groin area.  He reports that he's had some intermittent discomfort in bilateral groin areas for a few months, but this was the first episode of true discomfort.  Reports that he had been recently constipated as well and felt that straining was causing more pain in the left groin particularly.  Denies any nausea, vomiting, fevers, chills, other areas of pain.  In the ED, he had labs which were overall unremarkable and CT scan which showed bilateral inguinal hernias, left larger than right, both containing fat, and also a small umbilical hernia.  He was unaware of his umbilical hernia.  The patient was also found to have hepatic steatosis and has recently seen Dr. Vicente Males for this.  He is scheduled for EGD and colonoscopy on 04/10/21.   ? ?Of note, he does have a history of diabetes, HTN, HLP, and non-ischemic cardiomyopathy.  His last echocardiogram was in 2019 which showed normal EF of 60-65%, with no wall motion abnormalities and normal systolic function. ? ?Past Medical History: ?Past Medical History:  ?Diagnosis Date  ? Abnormal LFTs   ? Carcinoma ([place])   ? Diabetes mellitus without complication ([place])   ? borderline (11/14/20 - lost weight - no current issues)  ? Fundic gland polyps of stomach, benign   ? Gastritis   ? GERD (gastroesophageal reflux disease)   ? Hepatic steatosis   ? Hyperlipidemia   ? Hypertension   ? Nonischemic cardiomyopathy ([place])   ? Previous ejection fraction of 35-40% on echo. 50% on cardiac catheterization in August of 2013  ? Obesity   ? Sleep apnea   ? Squamous cell carcinoma of skin 02/27/2020  ? left distal lat deltoid - [facility]   ?  ? ?Past Surgical History: ?Past Surgical History:  ?Procedure Laterality Date  ? BONE EXCISION Right 11/21/2020  ? Procedure: PART EXCISION BONE- PHALANX;  Surgeon: Samara Deist, DPM;  Location: [facility];  Service: Podiatry;  Laterality: Right;  ? CARDIAC CATHETERIZATION N/A 09/2011  ? [facility]; EF 50% with 30% mid LAD stenosis and no obstructive disease.  ? CARDIAC CATHETERIZATION  09/2010  ? [facility]; Mid LAD 40% stenosis; Mid Circumflex:Normal; Mid RCA; Normal  ? CARDIAC CATHETERIZATION    ? COLONOSCOPY    ? ESOPHAGOGASTRODUODENOSCOPY (EGD) WITH PROPOFOL N/A 06/10/2017  ? Procedure: ESOPHAGOGASTRODUODENOSCOPY (EGD) WITH PROPOFOL;  Surgeon: Jonathon Bellows, MD;  Location: [facility];  Service: Gastroenterology;  Laterality: N/A;  ? HAMMER TOE SURGERY Right 11/21/2020  ? Procedure: HAMMERTOE CORRECTION;  Surgeon: Samara Deist, DPM;  Location: [facility];  Service: Podiatry;  Laterality: Right;  ? KNEE ARTHROSCOPY WITH MEDIAL MENISECTOMY Right 09/16/2012  ? Procedure: RIGHT KNEE ARTHROSCOPY WITH MEDIAL AND LATERAL MENISECTOMY, CHONDROPLASTY;  Surgeon: Ninetta Lights, MD;  Location: [facility];  Service: Orthopedics;  Laterality: Right;  RIGHT KNEE SCOPE MEDIAL MENISCECTOMY  ? LEFT HEART CATH AND CORONARY ANGIOGRAPHY N/A 06/11/2016  ? Procedure: Left Heart Cath and Coronary Angiography;  Surgeon: Minna Merritts, MD;  Location: [facility];  Service: Cardiovascular;  Laterality: N/A;  ? ? ?Home Medications: ?Prior to Admission medications   ?Medication Sig Start Date  End Date Taking? Authorizing Provider  ?buPROPion (WELLBUTRIN XL) 150 MG 24 hr tablet Take 1 tablet (150 mg total) by mouth daily. 01/17/21  Yes McDonough, Lauren K, PA-C  ?citalopram (CELEXA) 40 MG tablet TAKE 1 TABLET BY MOUTH EVERY DAY FOR GAD 01/17/21  Yes McDonough, Lauren K, PA-C  ?gabapentin (NEURONTIN) 100 MG capsule Take 100 mg by mouth 3 (three) times daily.   Yes [provider]   ?lisinopril-hydrochlorothiazide (ZESTORETIC) 20-25 MG tablet Take 1 tablet by mouth in the morning and at bedtime. 12/05/18  Yes [provider]  ?Multiple Vitamins-Minerals (MULTIVITAMIN ADULTS PO) Take by mouth.   Yes [provider]  ?Na Sulfate-K Sulfate-Mg Sulf 17.5-3.13-1.6 GM/177ML SOLN PLEASE SEE ATTACHED FOR DETAILED DIRECTIONS 04/05/21  Yes [provider]  ?nitroGLYCERIN (NITROSTAT) 0.4 MG SL tablet Place 1 tablet (0.4 mg total) under the tongue every 5 (five) minutes x 3 doses as needed for chest pain. 01/09/18  Yes Fritzi Mandes, MD  ? ? ?Allergies: ?Allergies  ?Allergen Reactions  ? Statins Rash  ?   ?  ? ? ?Social History: ? reports that he has never smoked. He quit smokeless tobacco use about 36 years ago. He reports that he does not currently use alcohol. He reports that he does not use drugs.  ? ?Family History: ?Family History  ?Problem Relation Age of Onset  ? Heart disease Father 52  ?     MI  ? Heart attack Father   ? Stomach cancer Father   ? Hypertension Mother   ? Breast cancer Mother   ? ? ?Review of Systems: ?Review of Systems  ?Constitutional:  Negative for chills and fever.  ?HENT:  Negative for hearing loss.   ?Respiratory:  Negative for shortness of breath.   ?Cardiovascular:  Negative for chest pain.  ?Gastrointestinal:  Positive for abdominal pain and constipation. Negative for diarrhea, nausea and vomiting.  ?Genitourinary:  Negative for dysuria.  ?Musculoskeletal:  Negative for myalgias.  ?Skin:  Negative for rash.  ?Neurological:  Negative for dizziness.  ?Psychiatric/Behavioral:  Negative for depression.   ? ?Physical Exam ?BP (!) 154/90   Pulse 60   Temp 98.2 ?F (36.8 ?C) (Oral)   Ht 6' 2"  (1.88 m)   Wt (!) 314 lb (142.4 kg)   SpO2 97%   BMI 40.32 kg/m?  ?CONSTITUTIONAL: No acute distress. ?HEENT:  Normocephalic, atraumatic, extraocular motion intact. ?NECK: Trachea is midline, and there is no jugular venous distension.  ?RESPIRATORY:  Lungs are  clear, and breath sounds are equal bilaterally. Normal respiratory effort without pathologic use of accessory muscles. ?CARDIOVASCULAR: Heart is regular without murmurs, gallops, or rubs. ?GI: The abdomen is soft, obese, non-distended, with some tenderness to palpation in the left groin, particularly towards the lower portion of the crease.  No discrete bulging mass or hernia defect is palpable, but this may be difficult due to his body habitus.  Also unable to palpate hernia defect or bulging in the right groin, but also may not be as feasible due to his body habitus.  He does not have tenderness in the right groin.  Has small umbilical hernia which is easily reducible.  ?MUSCULOSKELETAL:  Normal muscle strength and tone in all four extremities.  No peripheral edema or cyanosis. ?SKIN: Skin turgor is normal. There are no pathologic skin lesions.  ?NEUROLOGIC:  Motor and sensation is grossly normal.  Cranial nerves are grossly intact. ?PSYCH:  Alert and oriented to person, place and time. Affect is normal. ? ?Laboratory  Analysis: ?Labs from 02/28/21: ?Na 136, K 3.7, Cl 98, CO2 29, BUN 18, Cr 1.12.  Total bili 0.7, AST 24, ALT 29, Alk Phos 69, lipase 22.  WBC 9.6, Hgb 14.6, Hct 43.1, Plt 232. ? ?Imaging: ?CT scan abdomen/pelvis 02/28/21: ?IMPRESSION: ?1. No acute abnormality or explanation for left lower quadrant pain. ?2. Hepatic steatosis. Slight liver capsular nodularity raises concern for cirrhosis. Recommend correlation with cirrhosis risk factors. ?3. Small fat containing umbilical and bilateral inguinal hernias. ?  ?Aortic Atherosclerosis (ICD10-I70.0). ? ?Assessment and Plan: ?This is a 56 y.o. male with bilateral inguinal hernias and umbilical hernia ? ?--Discussed with the patient that I am unable to fully palpate a hernia defect or bulging in either groin, but this may be harder due to body habitus.  He is tender in the left groin towards the crease, and on CT scan I can clearly see the inguinal canal  containing fat on both sides.  He does have a reducible umbilical hernia as well.  Discussed with him that we can offer a robotic assisted bilateral inguinal hernia repair and open umbilical hernia repair.  Reviewed with him the sur

## 2021-04-08 NOTE — Patient Instructions (Signed)
Our surgery scheduler will call you within 24-48 hours to schedule your surgery. Please have the Blue surgery sheet available when speaking with her.   Inguinal Hernia, Adult An inguinal hernia develops when fat or the intestines push through a weak spot in a muscle where the leg meets the lower abdomen (groin). This creates a bulge. This kind of hernia could also be: In the scrotum, if you are male. In folds of skin around the vagina, if you are male. There are three types of inguinal hernias: Hernias that can be pushed back into the abdomen (are reducible). This type rarely causes pain. Hernias that are not reducible (are incarcerated). Hernias that are not reducible and lose their blood supply (are strangulated). This type of hernia requires emergency surgery. What are the causes? This condition is caused by having a weak spot in the muscles or tissues in your groin. This develops over time. The hernia may poke through the weak spot when you suddenly strain your lower abdominal muscles, such as when you: Lift a heavy object. Strain to have a bowel movement. Constipation can lead to straining. Cough. What increases the risk? This condition is more likely to develop in: Males. Pregnant females. People who: Are overweight. Work in jobs that require long periods of standing or heavy lifting. Have had an inguinal hernia before. Smoke or have lung disease. These factors can lead to long-term (chronic) coughing. What are the signs or symptoms? Symptoms may depend on the size of the hernia. Often, a small inguinal hernia has no symptoms. Symptoms of a larger hernia may include: A bulge in the groin area. This is easier to see when standing. It might not be visible when lying down. Pain or burning in the groin. This may get worse when lifting, straining, or coughing. A dull ache or a feeling of pressure in the groin. An unusual bulge in the scrotum, in males. Symptoms of a strangulated  inguinal hernia may include: A bulge in your groin that is very painful and tender to the touch. A bulge that turns red or purple. Fever, nausea, and vomiting. Inability to have a bowel movement or to pass gas. How is this diagnosed? This condition is diagnosed based on your symptoms, your medical history, and a physical exam. Your health care provider may feel your groin area and ask you to cough. How is this treated? Treatment depends on the size of your hernia and whether you have symptoms. If you do not have symptoms, your health care provider may have you watch your hernia carefully and have you come in for follow-up visits. If your hernia is large or if you have symptoms, you may need surgery to repair the hernia. Follow these instructions at home: Lifestyle Avoid lifting heavy objects. Avoid standing for long periods of time. Do not use any products that contain nicotine or tobacco. These products include cigarettes, chewing tobacco, and vaping devices, such as e-cigarettes. If you need help quitting, ask your health care provider. Maintain a healthy weight. Preventing constipation You may need to take these actions to prevent or treat constipation: Drink enough fluid to keep your urine pale yellow. Take over-the-counter or prescription medicines. Eat foods that are high in fiber, such as beans, whole grains, and fresh fruits and vegetables. Limit foods that are high in fat and processed sugars, such as fried or sweet foods. General instructions You may try to push the hernia back in place by very gently pressing on it while lying down.   Do not try to force the bulge back in if it will not push in easily. Watch your hernia for any changes in shape, size, or color. Get help right away if you notice any changes. Take over-the-counter and prescription medicines only as told by your health care provider. Keep all follow-up visits. This is important. Contact a health care provider if: You  have a fever or chills. You develop new symptoms. Your symptoms get worse. Get help right away if: You have pain in your groin that suddenly gets worse. You have a bulge in your groin that: Suddenly gets bigger and does not get smaller. Becomes red or purple or painful to the touch. You are a man and you have a sudden pain in your scrotum, or the size of your scrotum suddenly changes. You cannot push the hernia back in place by very gently pressing on it when you are lying down. You have nausea or vomiting that does not go away. You have a fast heartbeat. You cannot have a bowel movement or pass gas. These symptoms may represent a serious problem that is an emergency. Do not wait to see if the symptoms will go away. Get medical help right away. Call your local emergency services (911 in the [place] Summary An inguinal hernia develops when fat or the intestines push through a weak spot in a muscle where your leg meets your lower abdomen (groin). This condition is caused by having a weak spot in muscles or tissues in your groin. Symptoms may depend on the size of the hernia, and they may include pain or swelling in your groin. A small inguinal hernia often has no symptoms. Treatment may not be needed if you do not have symptoms. If you have symptoms or a large hernia, you may need surgery to repair the hernia. Avoid lifting heavy objects. Also, avoid standing for long periods of time. This information is not intended to replace advice given to you by your health care provider. Make sure you discuss any questions you have with your health care provider. Document Revised: 09/20/2019 Document Reviewed: 09/20/2019 Elsevier Patient Education  2022 [employer] Inc[employer]  

## 2021-04-08 NOTE — Progress Notes (Signed)
°04/08/2021 ° °Reason for Visit:  Bilateral inguinal hernia, umbilical hernia ° °History of Present Illness: °Gerald [name] is a 56 y.o. male presenting for evaluation of bilateral inguinal hernia and umbilical hernia.  Patient presented to the ED on 02/28/21 with left abdominal pain.  The patient reports that the pain was in the left lower quadrant closer to the left groin area.  He reports that he's had some intermittent discomfort in bilateral groin areas for a few months, but this was the first episode of true discomfort.  Reports that he had been recently constipated as well and felt that straining was causing more pain in the left groin particularly.  Denies any nausea, vomiting, fevers, chills, other areas of pain.  In the ED, he had labs which were overall unremarkable and CT scan which showed bilateral inguinal hernias, left larger than right, both containing fat, and also a small umbilical hernia.  He was unaware of his umbilical hernia.  The patient was also found to have hepatic steatosis and has recently seen Dr. Anna for this.  He is scheduled for EGD and colonoscopy on 04/10/21.   ° °Of note, he does have a history of diabetes, HTN, HLP, and non-ischemic cardiomyopathy.  His last echocardiogram was in 2019 which showed normal EF of 60-65%, with no wall motion abnormalities and normal systolic function. ° °Past Medical History: °Past Medical History:  °Diagnosis Date  ° Abnormal LFTs   ° Carcinoma ([facility])   ° Diabetes mellitus without complication ([facility])   ° borderline (11/14/20 - lost weight - no current issues)  ° Fundic gland polyps of stomach, benign   ° Gastritis   ° GERD (gastroesophageal reflux disease)   ° Hepatic steatosis   ° Hyperlipidemia   ° Hypertension   ° Nonischemic cardiomyopathy ([facility])   ° Previous ejection fraction of 35-40% on echo. 50% on cardiac catheterization in August of 2013  ° Obesity   ° Sleep apnea   ° Squamous cell carcinoma of skin 02/27/2020  ° left distal lat deltoid - [facility]   °  ° °Past Surgical History: °Past Surgical History:  °Procedure Laterality Date  ° BONE EXCISION Right 11/21/2020  ° Procedure: PART EXCISION BONE- PHALANX;  Surgeon: Fowler, Justin, DPM;  Location: [facility];  Service: Podiatry;  Laterality: Right;  ° CARDIAC CATHETERIZATION N/A 09/2011  ° [facility]; EF 50% with 30% mid LAD stenosis and no obstructive disease.  ° CARDIAC CATHETERIZATION  09/2010  ° [facility]; Mid LAD 40% stenosis; Mid Circumflex:Normal; Mid RCA; Normal  ° CARDIAC CATHETERIZATION    ° COLONOSCOPY    ° ESOPHAGOGASTRODUODENOSCOPY (EGD) WITH PROPOFOL N/A 06/10/2017  ° Procedure: ESOPHAGOGASTRODUODENOSCOPY (EGD) WITH PROPOFOL;  Surgeon: Anna, Kiran, MD;  Location: [facility];  Service: Gastroenterology;  Laterality: N/A;  ° HAMMER TOE SURGERY Right 11/21/2020  ° Procedure: HAMMERTOE CORRECTION;  Surgeon: Fowler, Justin, DPM;  Location: [facility];  Service: Podiatry;  Laterality: Right;  ° KNEE ARTHROSCOPY WITH MEDIAL MENISECTOMY Right 09/16/2012  ° Procedure: RIGHT KNEE ARTHROSCOPY WITH MEDIAL AND LATERAL MENISECTOMY, CHONDROPLASTY;  Surgeon: Daniel F Murphy, MD;  Location: [facility];  Service: Orthopedics;  Laterality: Right;  RIGHT KNEE SCOPE MEDIAL MENISCECTOMY  ° LEFT HEART CATH AND CORONARY ANGIOGRAPHY N/A 06/11/2016  ° Procedure: Left Heart Cath and Coronary Angiography;  Surgeon: Gollan, Timothy J, MD;  Location: [facility];  Service: Cardiovascular;  Laterality: N/A;  ° ° °Home Medications: °Prior to Admission medications   °Medication Sig Start Date   Date End Date Taking? Authorizing Provider  buPROPion (WELLBUTRIN XL) 150 MG 24 hr tablet Take 1 tablet (150 mg total) by mouth daily. 01/17/21  Yes McDonough, Lauren K, PA-C  citalopram (CELEXA) 40 MG tablet TAKE 1 TABLET BY MOUTH EVERY DAY FOR GAD 01/17/21  Yes McDonough, Lauren K, PA-C  gabapentin (NEURONTIN) 100 MG capsule Take 100 mg by mouth 3 (three) times daily.   Yes [provider]   lisinopril-hydrochlorothiazide (ZESTORETIC) 20-25 MG tablet Take 1 tablet by mouth in the morning and at bedtime. 12/05/18  Yes [provider]  Multiple Vitamins-Minerals (MULTIVITAMIN ADULTS PO) Take by mouth.   Yes [provider]  Na Sulfate-K Sulfate-Mg Sulf 17.5-3.13-1.6 GM/177ML SOLN PLEASE SEE ATTACHED FOR DETAILED DIRECTIONS 04/05/21  Yes [provider]  nitroGLYCERIN (NITROSTAT) 0.4 MG SL tablet Place 1 tablet (0.4 mg total) under the tongue every 5 (five) minutes x 3 doses as needed for chest pain. 01/09/18  Yes Fritzi Mandes, MD    Allergies: Allergies  Allergen Reactions   Statins Rash         Social History:  reports that he has never smoked. He quit smokeless tobacco use about 36 years ago. He reports that he does not currently use alcohol. He reports that he does not use drugs.   Family History: Family History  Problem Relation Age of Onset   Heart disease Father 19       MI   Heart attack Father    Stomach cancer Father    Hypertension Mother    Breast cancer Mother     Review of Systems: Review of Systems  Constitutional:  Negative for chills and fever.  HENT:  Negative for hearing loss.   Respiratory:  Negative for shortness of breath.   Cardiovascular:  Negative for chest pain.  Gastrointestinal:  Positive for abdominal pain and constipation. Negative for diarrhea, nausea and vomiting.  Genitourinary:  Negative for dysuria.  Musculoskeletal:  Negative for myalgias.  Skin:  Negative for rash.  Neurological:  Negative for dizziness.  Psychiatric/Behavioral:  Negative for depression.    Physical Exam BP (!) 154/90    Pulse 60    Temp 98.2 F (36.8 C) (Oral)    Ht 6' 2" (1.88 m)    Wt (!) 314 lb (142.4 kg)    SpO2 97%    BMI 40.32 kg/m  CONSTITUTIONAL: No acute distress. HEENT:  Normocephalic, atraumatic, extraocular motion intact. NECK: Trachea is midline, and there is no jugular venous distension.  RESPIRATORY:  Lungs are  clear, and breath sounds are equal bilaterally. Normal respiratory effort without pathologic use of accessory muscles. CARDIOVASCULAR: Heart is regular without murmurs, gallops, or rubs. GI: The abdomen is soft, obese, non-distended, with some tenderness to palpation in the left groin, particularly towards the lower portion of the crease.  No discrete bulging mass or hernia defect is palpable, but this may be difficult due to his body habitus.  Also unable to palpate hernia defect or bulging in the right groin, but also may not be as feasible due to his body habitus.  He does not have tenderness in the right groin.  Has small umbilical hernia which is easily reducible.  MUSCULOSKELETAL:  Normal muscle strength and tone in all four extremities.  No peripheral edema or cyanosis. SKIN: Skin turgor is normal. There are no pathologic skin lesions.  NEUROLOGIC:  Motor and sensation is grossly normal.  Cranial nerves are grossly intact. PSYCH:  Alert and oriented to person, place  time. Affect is normal. ° °Laboratory Analysis: °Labs from 02/28/21: °Na 136, K 3.7, Cl 98, CO2 29, BUN 18, Cr 1.12.  Total bili 0.7, AST 24, ALT 29, Alk Phos 69, lipase 22.  WBC 9.6, Hgb 14.6, Hct 43.1, Plt 232. ° °Imaging: °CT scan abdomen/pelvis 02/28/21: °IMPRESSION: °1. No acute abnormality or explanation for left lower quadrant pain. °2. Hepatic steatosis. Slight liver capsular nodularity raises concern for cirrhosis. Recommend correlation with cirrhosis risk factors. °3. Small fat containing umbilical and bilateral inguinal hernias. °  °Aortic Atherosclerosis (ICD10-I70.0). ° °Assessment and Plan: °This is a 55 y.o. male with bilateral inguinal hernias and umbilical hernia ° °--Discussed with the patient that I am unable to fully palpate a hernia defect or bulging in either groin, but this may be harder due to body habitus.  He is tender in the left groin towards the crease, and on CT scan I can clearly see the inguinal canal  containing fat on both sides.  He does have a reducible umbilical hernia as well.  Discussed with him that we can offer a robotic assisted bilateral inguinal hernia repair and open umbilical hernia repair.  Reviewed with him the surgery at length including the risks of bleeding, infection, injury to surrounding structures, post-op activity restrictions, that this is outpatient surgery, and he's willing to proceed. °--Will schedule him for 04/16/21.  Will also send for medical clearance. ° °I spent 60 minutes dedicated to the care of this patient on the date of this encounter to include pre-visit review of records, face-to-face time with the patient discussing diagnosis and management, and any post-visit coordination of care. ° ° °Jose Luis Piscoya, MD °[facility] ° °  °

## 2021-04-08 NOTE — Telephone Encounter (Signed)
Medical clearance faxed to Dr.Fozia Humphrey Rolls at this time. ?

## 2021-04-09 ENCOUNTER — Telehealth: Payer: Self-pay | Admitting: Surgery

## 2021-04-09 NOTE — Telephone Encounter (Signed)
Outgoing call is made, left message for patient to call.  Please inform patient regarding scheduled surgery:  ? ?Pre-Admission date/time, COVID Testing date and Surgery date. ? ?Surgery Date: 04/16/21 ?Preadmission Testing Date: 04/11/21 (phone 1p-5p) ?Covid Testing Date: Not needed.    ? ?Also patient will need to call at [phone], between 1-3:00pm the day before surgery, to find out what time to arrive for surgery.   ? ?

## 2021-04-09 NOTE — Telephone Encounter (Signed)
Patient calls back, he is now aware of all dates regarding surgery and verbalized understanding.  ?

## 2021-04-10 ENCOUNTER — Ambulatory Visit: Payer: 59 | Admitting: Anesthesiology

## 2021-04-10 ENCOUNTER — Encounter
Admission: RE | Disposition: A | Discharge status: Home / Self Care | Payer: Self-pay | Source: Home / Self Care | Attending: Gastroenterology

## 2021-04-10 ENCOUNTER — Ambulatory Visit
Admission: RE | Admit: 2021-04-10 | Discharge: 2021-04-10 | Disposition: A | Discharge status: Home / Self Care | Payer: 59 | Attending: Gastroenterology | Admitting: Gastroenterology

## 2021-04-10 ENCOUNTER — Encounter: Payer: Self-pay | Admitting: Gastroenterology

## 2021-04-10 DIAGNOSIS — E119 Type 2 diabetes mellitus without complications: Secondary | ICD-10-CM | POA: Insufficient documentation

## 2021-04-10 DIAGNOSIS — K317 Polyp of stomach and duodenum: Secondary | ICD-10-CM | POA: Diagnosis not present

## 2021-04-10 DIAGNOSIS — K640 First degree hemorrhoids: Secondary | ICD-10-CM | POA: Diagnosis not present

## 2021-04-10 DIAGNOSIS — K7460 Unspecified cirrhosis of liver: Secondary | ICD-10-CM | POA: Insufficient documentation

## 2021-04-10 DIAGNOSIS — K219 Gastro-esophageal reflux disease without esophagitis: Secondary | ICD-10-CM | POA: Diagnosis not present

## 2021-04-10 DIAGNOSIS — I10 Essential (primary) hypertension: Secondary | ICD-10-CM | POA: Insufficient documentation

## 2021-04-10 DIAGNOSIS — Z85828 Personal history of other malignant neoplasm of skin: Secondary | ICD-10-CM | POA: Insufficient documentation

## 2021-04-10 DIAGNOSIS — M1990 Unspecified osteoarthritis, unspecified site: Secondary | ICD-10-CM | POA: Insufficient documentation

## 2021-04-10 DIAGNOSIS — K635 Polyp of colon: Secondary | ICD-10-CM | POA: Diagnosis not present

## 2021-04-10 DIAGNOSIS — D125 Benign neoplasm of sigmoid colon: Secondary | ICD-10-CM | POA: Insufficient documentation

## 2021-04-10 DIAGNOSIS — K259 Gastric ulcer, unspecified as acute or chronic, without hemorrhage or perforation: Secondary | ICD-10-CM | POA: Diagnosis not present

## 2021-04-10 DIAGNOSIS — Z6838 Body mass index (BMI) 38.0-38.9, adult: Secondary | ICD-10-CM | POA: Insufficient documentation

## 2021-04-10 DIAGNOSIS — E669 Obesity, unspecified: Secondary | ICD-10-CM | POA: Insufficient documentation

## 2021-04-10 DIAGNOSIS — D122 Benign neoplasm of ascending colon: Secondary | ICD-10-CM | POA: Diagnosis not present

## 2021-04-10 DIAGNOSIS — Z1211 Encounter for screening for malignant neoplasm of colon: Secondary | ICD-10-CM | POA: Diagnosis present

## 2021-04-10 DIAGNOSIS — G4730 Sleep apnea, unspecified: Secondary | ICD-10-CM | POA: Insufficient documentation

## 2021-04-10 HISTORY — PX: COLONOSCOPY WITH PROPOFOL: SHX5780

## 2021-04-10 HISTORY — PX: ESOPHAGOGASTRODUODENOSCOPY: SHX5428

## 2021-04-10 LAB — GLUCOSE, CAPILLARY: Glucose-Capillary: 128 mg/dL — ABNORMAL HIGH (ref 70–99)

## 2021-04-10 SURGERY — COLONOSCOPY WITH PROPOFOL
Anesthesia: General

## 2021-04-10 MED ORDER — MIDAZOLAM HCL 2 MG/2ML IJ SOLN
INTRAMUSCULAR | Status: AC
  Filled 2021-04-10: qty 2

## 2021-04-10 MED ORDER — GLYCOPYRROLATE 0.2 MG/ML IJ SOLN
INTRAMUSCULAR | Status: DC | PRN
  Administered 2021-04-10: .2 mg via INTRAVENOUS

## 2021-04-10 MED ORDER — GLYCOPYRROLATE 0.2 MG/ML IJ SOLN
INTRAMUSCULAR | Status: AC
  Filled 2021-04-10: qty 1

## 2021-04-10 MED ORDER — SODIUM CHLORIDE 0.9 % IV SOLN: INTRAVENOUS | Status: DC

## 2021-04-10 MED ORDER — MIDAZOLAM HCL 5 MG/5ML IJ SOLN
INTRAMUSCULAR | Status: DC | PRN
  Administered 2021-04-10: 2 mg via INTRAVENOUS

## 2021-04-10 MED ORDER — LIDOCAINE HCL (PF) 2 % IJ SOLN
INTRAMUSCULAR | Status: AC
  Filled 2021-04-10: qty 5

## 2021-04-10 MED ORDER — PROPOFOL 500 MG/50ML IV EMUL
INTRAVENOUS | Status: DC | PRN
  Administered 2021-04-10: 120 ug/kg/min via INTRAVENOUS

## 2021-04-10 MED ORDER — LIDOCAINE 2% (20 MG/ML) 5 ML SYRINGE
INTRAMUSCULAR | Status: DC | PRN
  Administered 2021-04-10: 20 mg via INTRAVENOUS

## 2021-04-10 MED ORDER — PROPOFOL 500 MG/50ML IV EMUL
INTRAVENOUS | Status: AC
  Filled 2021-04-10: qty 150

## 2021-04-10 MED ORDER — PROPOFOL 10 MG/ML IV BOLUS
INTRAVENOUS | Status: DC | PRN
Start: 2021-04-10 — End: 2021-04-10
  Administered 2021-04-10: 100 mg via INTRAVENOUS

## 2021-04-10 NOTE — Op Note (Signed)
[facility] ?Gastroenterology ?Patient Name: Gerald [name] ?Procedure Date: 04/10/2021 7:30 AM ?MRN: [id number] ?Account #: [id number] ?Date of Birth: 30-Dec-1965 ?Admit Type: Outpatient ?Age: 56 ?Room: [facility] ENDO ROOM 3 ?Gender: Male ?Note Status: Finalized ?Instrument Name: Colonoscope [id number] ?Procedure:             Colonoscopy ?Indications:           Screening for colorectal malignant neoplasm ?Providers:             Jonathon Bellows MD, MD ?Referring MD:          Lavera Guise, MD (Referring MD) ?Medicines:             Monitored Anesthesia Care ?Complications:         No immediate complications. ?Procedure:             Pre-Anesthesia Assessment: ?                       - Prior to the procedure, a History and Physical was  ?                       performed, and patient medications, allergies and  ?                       sensitivities were reviewed. The patient's tolerance  ?                       of previous anesthesia was reviewed. ?                       - The risks and benefits of the procedure and the  ?                       sedation options and risks were discussed with the  ?                       patient. All questions were answered and informed  ?                       consent was obtained. ?                       - ASA Grade Assessment: II - A patient with mild  ?                       systemic disease. ?                       After obtaining informed consent, the colonoscope was  ?                       passed under direct vision. Throughout the procedure,  ?                       the patient's blood pressure, pulse, and oxygen  ?                       saturations were monitored continuously. The  ?                       Colonoscope was  introduced through the anus and  ?                       advanced to the the cecum, identified by the  ?                       appendiceal orifice. The colonoscopy was performed  ?                       with ease. The patient tolerated the procedure well.  ?                        The quality of the bowel preparation was excellent. ?Findings: ?     The perianal and digital rectal examinations were normal. ?     Two sessile polyps were found in the sigmoid colon, ascending colon and  ?     proximal ascending colon. The polyps were 5 to 6 mm in size. These  ?     polyps were removed with a cold snare. Resection and retrieval were  ?     complete. ?     Non-bleeding internal hemorrhoids were found during retroflexion. The  ?     hemorrhoids were medium-sized and Grade I (internal hemorrhoids that do  ?     not prolapse). ?     The exam was otherwise without abnormality on direct and retroflexion  ?     views. ?Impression:            - Two 5 to 6 mm polyps in the sigmoid colon, in the  ?                       ascending colon and in the proximal ascending colon,  ?                       removed with a cold snare. Resected and retrieved. ?                       - Non-bleeding internal hemorrhoids. ?                       - The examination was otherwise normal on direct and  ?                       retroflexion views. ?Recommendation:        - Discharge patient to home (with escort). ?                       - Resume previous diet. ?                       - Continue present medications. ?                       - Await pathology results. ?                       - Repeat colonoscopy for surveillance based on  ?                       pathology results. ?Procedure Code(s):     --- Professional --- ?  45385, Colonoscopy, flexible; with removal of  ?                       tumor(s), polyp(s), or other lesion(s) by snare  ?                       technique ?Diagnosis Code(s):     --- Professional --- ?                       K63.5, Polyp of colon ?                       Z12.11, Encounter for screening for malignant neoplasm  ?                       of colon ?                       K64.0, First degree hemorrhoids ?CPT copyright 2019 American Medical Association. All  rights reserved. ?The codes documented in this report are preliminary and upon coder review may  ?be revised to meet current compliance requirements. ?Jonathon Bellows, MD ?Jonathon Bellows MD, MD ?04/10/2021 8:58:22 AM ?This report has been signed electronically. ?Number of Addenda: 0 ?Note Initiated On: 04/10/2021 7:30 AM ?Scope Withdrawal Time: 0 hours 8 minutes 53 seconds  ?Total Procedure Duration: 0 hours 10 minutes 35 seconds  ?Estimated Blood Loss:  Estimated blood loss: none. ?     [facility] ?

## 2021-04-10 NOTE — Anesthesia Postprocedure Evaluation (Signed)
Anesthesia Post Note ? ?Patient: Gerald [name] ? ?Procedure(s) Performed: COLONOSCOPY WITH PROPOFOL ?ESOPHAGOGASTRODUODENOSCOPY (EGD) ? ?Patient location during evaluation: Endoscopy ?Anesthesia Type: General ?Level of consciousness: awake and alert ?Pain management: pain level controlled ?Vital Signs Assessment: post-procedure vital signs reviewed and stable ?Respiratory status: spontaneous breathing, nonlabored ventilation, respiratory function stable and patient connected to nasal cannula oxygen ?Cardiovascular status: blood pressure returned to baseline and stable ?Postop Assessment: no apparent nausea or vomiting ?Anesthetic complications: no ? ? ?No notable events documented. ? ? ?Last Vitals:  ?Vitals:  ? 04/10/21 0742  ?BP: 122/71  ?Pulse: (!) 55  ?Resp: 16  ?Temp: (!) 35.7 ?C  ?SpO2: 100%  ?  ?Last Pain:  ?Vitals:  ? 04/10/21 0940  ?TempSrc:   ?PainSc: 0-No pain  ? ? ?  ?  ?  ?  ?  ?  ? ?Gerald [name] ? ? ? ? ?

## 2021-04-10 NOTE — Op Note (Addendum)
[facility] ?Gastroenterology ?Patient Name: Gerald [name] ?Procedure Date: 04/10/2021 7:31 AM ?MRN: [id number] ?Account #: [id number] ?Date of Birth: [birth year] ?Admit Type: Outpatient ?Age: 56 ?Room: [facility] ENDO ROOM 3 ?Gender: Male ?Note Status: Finalized ?Instrument Name: Upper Endoscope [id number] ?Procedure:             Upper GI endoscopy ?Indications:           Cirrhosis rule out esophageal varices ?Providers:             Jonathon Bellows MD, MD ?Referring MD:          Lavera Guise, MD (Referring MD) ?Medicines:             Monitored Anesthesia Care ?Complications:         No immediate complications. ?Procedure:             Pre-Anesthesia Assessment: ?                       - Prior to the procedure, a History and Physical was  ?                       performed, and patient medications, allergies and  ?                       sensitivities were reviewed. The patient's tolerance  ?                       of previous anesthesia was reviewed. ?                       - The risks and benefits of the procedure and the  ?                       sedation options and risks were discussed with the  ?                       patient. All questions were answered and informed  ?                       consent was obtained. ?                       - ASA Grade Assessment: II - A patient with mild  ?                       systemic disease. ?                       After obtaining informed consent, the endoscope was  ?                       passed under direct vision. Throughout the procedure,  ?                       the patient's blood pressure, pulse, and oxygen  ?                       saturations were monitored continuously. The Endoscope  ?  was introduced through the mouth, and advanced to the  ?                       third part of duodenum. The upper GI endoscopy was  ?                       accomplished with ease. The patient tolerated the  ?                       procedure well. ?Findings: ?      The esophagus was normal. ?     The examined duodenum was normal. ?     One non-bleeding superficial gastric ulcer with no stigmata of bleeding  ?     was found in the gastric antrum. The lesion was 10 mm in largest  ?     dimension. Biopsies were taken with a cold forceps for histology. ?     Multiple 5 to 10 mm sessile polyps with no bleeding and no stigmata of  ?     recent bleeding were found on the greater curvature of the stomach and  ?     in the stomach. The polyp was removed with a hot snare. Resection and  ?     retrieval were complete. To prevent bleeding after the polypectomy, one  ?     hemostatic clip was successfully placed. There was no bleeding during,  ?     or at the end, of the procedure. ?     The cardia and gastric fundus were normal on retroflexion. ?Impression:            - Normal esophagus. ?                       - Normal examined duodenum. ?                       - Non-bleeding gastric ulcer with no stigmata of  ?                       bleeding. Biopsied. ?                       - Multiple gastric polyps. Resected and retrieved.  ?                       Clip was placed. ?Recommendation:        - Await pathology results. ?                       - Repeat upper endoscopy in 3 years for surveillance. ?                       - Perform a colonoscopy today. ?                       - No ibuprofen, naproxen, or other non-steroidal  ?                       anti-inflammatory drugs. ?                       - Use Prilosec (omeprazole) 20 mg PO BID  for 6 weeks. ?Procedure Code(s):     --- Professional --- ?                       [phone], Esophagogastroduodenoscopy, flexible,  ?                       transoral; with removal of tumor(s), polyp(s), or  ?                       other lesion(s) by snare technique ?                       43239, 59, Esophagogastroduodenoscopy, flexible,  ?                       transoral; with biopsy, single or multiple ?Diagnosis Code(s):     --- Professional --- ?                        K25.9, Gastric ulcer, unspecified as acute or chronic,  ?                       without hemorrhage or perforation ?                       K31.7, Polyp of stomach and duodenum ?                       K74.60, Unspecified cirrhosis of liver ?CPT copyright 2019 American Medical Association. All rights reserved. ?The codes documented in this report are preliminary and upon coder review may  ?be revised to meet current compliance requirements. ?Jonathon Bellows, MD ?Jonathon Bellows MD, MD ?04/10/2021 8:45:11 AM ?This report has been signed electronically. ?Number of Addenda: 0 ?Note Initiated On: 04/10/2021 7:31 AM ?Estimated Blood Loss:  Estimated blood loss: none. ?     [facility] ?

## 2021-04-10 NOTE — H&P (Signed)
? ? ? ?Jonathon Bellows, MD ?[address], [place], [place], [place], [address] ?[address] [place], [place], [place], [address] ?Phone: [phone]  ?Fax: [phone] ? ?Primary Care Physician:  Lavera Guise, MD ? ? ?Pre-Procedure History & Physical: ?HPI:  Gerald [name] is a 56 y.o. male is here for an endoscopy and colonoscopy  ?  ?Past Medical History:  ?Diagnosis Date  ? Abnormal LFTs   ? Carcinoma ([place])   ? Diabetes mellitus without complication ([place])   ? borderline (11/14/20 - lost weight - no current issues)  ? Fundic gland polyps of stomach, benign   ? Gastritis   ? GERD (gastroesophageal reflux disease)   ? Hepatic steatosis   ? Hyperlipidemia   ? Hypertension   ? Nonischemic cardiomyopathy ([place])   ? Previous ejection fraction of 35-40% on echo. 50% on cardiac catheterization in August of 2013  ? Obesity   ? Sleep apnea   ? Squamous cell carcinoma of skin 02/27/2020  ? left distal lat deltoid - EDC  ? ? ?Past Surgical History:  ?Procedure Laterality Date  ? BONE EXCISION Right 11/21/2020  ? Procedure: PART EXCISION BONE- PHALANX;  Surgeon: Samara Deist, DPM;  Location: [place];  Service: Podiatry;  Laterality: Right;  ? CARDIAC CATHETERIZATION N/A 09/2011  ? [facility]; EF 50% with 30% mid LAD stenosis and no obstructive disease.  ? CARDIAC CATHETERIZATION  09/2010  ? [place]; Mid LAD 40% stenosis; Mid Circumflex:Normal; Mid RCA; Normal  ? CARDIAC CATHETERIZATION    ? COLONOSCOPY    ? ESOPHAGOGASTRODUODENOSCOPY (EGD) WITH PROPOFOL N/A 06/10/2017  ? Procedure: ESOPHAGOGASTRODUODENOSCOPY (EGD) WITH PROPOFOL;  Surgeon: Jonathon Bellows, MD;  Location: [facility] ENDOSCOPY;  Service: Gastroenterology;  Laterality: N/A;  ? HAMMER TOE SURGERY Right 11/21/2020  ? Procedure: HAMMERTOE CORRECTION;  Surgeon: Samara Deist, DPM;  Location: [place];  Service: Podiatry;  Laterality: Right;  ? KNEE ARTHROSCOPY WITH MEDIAL MENISECTOMY Right 09/16/2012  ? Procedure: RIGHT KNEE ARTHROSCOPY WITH MEDIAL AND LATERAL  MENISECTOMY, CHONDROPLASTY;  Surgeon: Ninetta Lights, MD;  Location: [place];  Service: Orthopedics;  Laterality: Right;  RIGHT KNEE SCOPE MEDIAL MENISCECTOMY  ? LEFT HEART CATH AND CORONARY ANGIOGRAPHY N/A 06/11/2016  ? Procedure: Left Heart Cath and Coronary Angiography;  Surgeon: Minna Merritts, MD;  Location: [place] [facility];  Service: Cardiovascular;  Laterality: N/A;  ? ? ?Prior to Admission medications   ?Medication Sig Start Date End Date Taking? Authorizing Provider  ?buPROPion (WELLBUTRIN XL) 150 MG 24 hr tablet Take 1 tablet (150 mg total) by mouth daily. 01/17/21  Yes McDonough, Lauren K, PA-C  ?citalopram (CELEXA) 40 MG tablet TAKE 1 TABLET BY MOUTH EVERY DAY FOR GAD 01/17/21  Yes McDonough, Lauren K, PA-C  ?gabapentin (NEURONTIN) 100 MG capsule Take 200 mg by mouth 3 (three) times daily.   Yes [provider]  ?lisinopril-hydrochlorothiazide (ZESTORETIC) 20-25 MG tablet Take 1 tablet by mouth in the morning and at bedtime. 12/05/18  Yes [provider]  ?Multiple Vitamins-Minerals (MULTIVITAMIN ADULTS PO) Take 1 tablet by mouth daily.   Yes [provider]  ?Na Sulfate-K Sulfate-Mg Sulf 17.5-3.13-1.6 GM/177ML SOLN PLEASE SEE ATTACHED FOR DETAILED DIRECTIONS 04/05/21  Yes [provider]  ?nitroGLYCERIN (NITROSTAT) 0.4 MG SL tablet Place 1 tablet (0.4 mg total) under the tongue every 5 (five) minutes x 3 doses as needed for chest pain. 01/09/18   Fritzi Mandes, MD  ? ? ?Allergies as of 04/02/2021 - Review Complete 04/01/2021  ?  Allergen Reaction Noted  ? Statins Rash 04/15/2012  ? ? ?Family History  ?Problem Relation Age of Onset  ? Heart disease Father 13  ?     MI  ? Heart attack Father   ? Stomach cancer Father   ? Hypertension Mother   ? Breast cancer Mother   ? ? ?Social History  ? ?Socioeconomic History  ? Marital status: Married  ?  Spouse name: Not on file  ? Number of children: 3  ? Years of education: Not on file  ? Highest education level:  Not on file  ?Occupational History  ? Occupation: Librarian, academic  ?Tobacco Use  ? Smoking status: Never  ? Smokeless tobacco: Former  ?  Quit date: 21  ?Vaping Use  ? Vaping Use: Never used  ?Substance and Sexual Activity  ? Alcohol use: Not Currently  ?  Alcohol/week: 0.0 standard drinks  ?  Comment: rare  ? Drug use: No  ? Sexual activity: Not on file  ?Other Topics Concern  ? Not on file  ?Social History Narrative  ? Married.  53 yo son.  ? Wife on disability for bipolar disorder.    ? ?Social Determinants of Health  ? ?Financial Resource Strain: Not on file  ?Food Insecurity: Not on file  ?Transportation Needs: Not on file  ?Physical Activity: Not on file  ?Stress: Not on file  ?Social Connections: Not on file  ?Intimate Partner Violence: Not on file  ? ? ?Review of Systems: ?See HPI, otherwise negative ROS ? ?Physical Exam: ?BP 122/71   Pulse (!) 55   Temp (!) 96.2 ?F (35.7 ?C) (Temporal)   Resp 16   Ht '6\' 2"'$  (1.88 m)   Wt 136.1 kg   SpO2 100%   BMI 38.52 kg/m?  ?General:   Alert,  pleasant and cooperative in NAD ?Head:  Normocephalic and atraumatic. ?Neck:  Supple; no masses or thyromegaly. ?Lungs:  Clear throughout to auscultation, normal respiratory effort.    ?Heart:  +S1, +S2, Regular rate and rhythm, No edema. ?Abdomen:  Soft, nontender and nondistended. Normal bowel sounds, without guarding, and without rebound.   ?Neurologic:  Alert and  oriented x4;  grossly normal neurologically. ? ?Impression/Plan: ?Gerald [name] is here for an endoscopy and colonoscopy  to be performed for  evaluation of esophageal varices and colon cancer screening  ?   ?Risks, benefits, limitations, and alternatives regarding endoscopy have been reviewed with the patient.  Questions have been answered.  All parties agreeable. ? ? ?Jonathon Bellows, MD  04/10/2021, 8:27 AM ? ?

## 2021-04-10 NOTE — Anesthesia Preprocedure Evaluation (Signed)
Anesthesia Evaluation  ?Patient identified by MRN, date of birth, ID band ?Patient awake ? ? ? ?Reviewed: ?Allergy & Precautions, NPO status , Patient's Chart, lab work & pertinent test results ? ?History of Anesthesia Complications ?Negative for: history of anesthetic complications ? ?Airway ?Mallampati: III ? ?TM Distance: <3 FB ?Neck ROM: full ? ? ? Dental ? ?(+) Chipped ?  ?Pulmonary ?neg shortness of breath, sleep apnea and Continuous Positive Airway Pressure Ventilation ,  ?  ?Pulmonary exam normal ? ? ? ? ? ? ? Cardiovascular ?Exercise Tolerance: Good ?hypertension, Normal cardiovascular exam ? ? ?  ?Neuro/Psych ?PSYCHIATRIC DISORDERS negative neurological ROS ?   ? GI/Hepatic ?Neg liver ROS, GERD  Controlled,  ?Endo/Other  ?diabetes, Type 2 ? Renal/GU ?negative Renal ROS  ?negative genitourinary ?  ?Musculoskeletal ? ?(+) Arthritis ,  ? Abdominal ?  ?Peds ? Hematology ?negative hematology ROS ?(+)   ?Anesthesia Other Findings ?Past Medical History: ?No date: Abnormal LFTs ?No date: Carcinoma [facility]) ?No date: Diabetes mellitus without complication ([place]) ?    Comment:  borderline (11/14/20 - lost weight - no current issues) ?No date: Fundic gland polyps of stomach, benign ?No date: Gastritis ?No date: GERD (gastroesophageal reflux disease) ?No date: Hepatic steatosis ?No date: Hyperlipidemia ?No date: Hypertension ?No date: Nonischemic cardiomyopathy ([place]) ?    Comment:  Previous ejection fraction of 35-40% on echo. 50% on  ?             cardiac catheterization in August of 2013 ?No date: Obesity ?No date: Sleep apnea ?02/27/2020: Squamous cell carcinoma of skin ?    Comment:  left distal lat deltoid - EDC ? ?Past Surgical History: ?11/21/2020: BONE EXCISION; Right ?    Comment:  Procedure: PART EXCISION BONE- PHALANX;  Surgeon:  ?             Samara Deist, DPM;  Location: [place];   ?             Service: Podiatry;  Laterality: Right; ?09/2011: CARDIAC  CATHETERIZATION; N/A ?    Comment:  [facility]; EF 50% with 30% mid LAD stenosis and no  ?             obstructive disease. ?09/2010: CARDIAC CATHETERIZATION ?    Comment:  [facility]; Mid LAD 40% stenosis; Mid Circumflex:Normal; Mid  ?             RCA; Normal ?No date: CARDIAC CATHETERIZATION ?No date: COLONOSCOPY ?06/10/2017: ESOPHAGOGASTRODUODENOSCOPY (EGD) WITH PROPOFOL; N/A ?    Comment:  Procedure: ESOPHAGOGASTRODUODENOSCOPY (EGD) WITH  ?             PROPOFOL;  Surgeon: Jonathon Bellows, MD;  Location: [facility]  ?             ENDOSCOPY;  Service: Gastroenterology;  Laterality: N/A; ?11/21/2020: HAMMER TOE SURGERY; Right ?    Comment:  Procedure: HAMMERTOE CORRECTION;  Surgeon: Vickki Muff,  ?             Larkin Ina, DPM;  Location: [place];  Service:  ?             Podiatry;  Laterality: Right; ?09/16/2012: KNEE ARTHROSCOPY WITH MEDIAL MENISECTOMY; Right ?    Comment:  Procedure: RIGHT KNEE ARTHROSCOPY WITH MEDIAL AND  ?             LATERAL MENISECTOMY, CHONDROPLASTY;  Surgeon: Nestor Ramp  ?             Percell Miller, MD;  Location:  [place];   ?             Service: Orthopedics;  Laterality: Right;  RIGHT KNEE  ?             SCOPE MEDIAL MENISCECTOMY ?06/11/2016: LEFT HEART CATH AND CORONARY ANGIOGRAPHY; N/A ?    Comment:  Procedure: Left Heart Cath and Coronary Angiography;   ?             Surgeon: Minna Merritts, MD;  Location: [facility] INVASIVE  ?             CV LAB;  Service: Cardiovascular;  Laterality: N/A; ? ?BMI   ? Body Mass Index: 38.52 kg/m?  ?  ? ? Reproductive/Obstetrics ?negative OB ROS ? ?  ? ? ? ? ? ? ? ? ? ? ? ? ? ?  ?  ? ? ? ? ? ? ? ? ?Anesthesia Physical ?Anesthesia Plan ? ?ASA: 3 ? ?Anesthesia Plan: General  ? ?Post-op Pain Management:   ? ?Induction: Intravenous ? ?PONV Risk Score and Plan: Propofol infusion and TIVA ? ?Airway Management Planned: Natural Airway and Nasal Cannula ? ?Additional Equipment:  ? ?Intra-op Plan:  ? ?Post-operative Plan:  ? ?Informed Consent: I have reviewed the patients History  and Physical, chart, labs and discussed the procedure including the risks, benefits and alternatives for the proposed anesthesia with the patient or authorized representative who has indicated his/her understanding and acceptance.  ? ? ? ?Dental Advisory Given ? ?Plan Discussed with: Anesthesiologist, CRNA and Surgeon ? ?Anesthesia Plan Comments: (Patient consented for risks of anesthesia including but not limited to:  ?- adverse reactions to medications ?- risk of airway placement if required ?- damage to eyes, teeth, lips or other oral mucosa ?- nerve damage due to positioning  ?- sore throat or hoarseness ?- Damage to heart, brain, nerves, lungs, other parts of body or loss of life ? ?Patient voiced understanding.)  ? ? ? ? ? ? ?Anesthesia Quick Evaluation ? ?

## 2021-04-10 NOTE — Transfer of Care (Signed)
Immediate Anesthesia Transfer of Care Note ? ?Patient: Gerald [name] ? ?Procedure(s) Performed: COLONOSCOPY WITH PROPOFOL ?ESOPHAGOGASTRODUODENOSCOPY (EGD) ? ?Patient Location: Endoscopy Unit ? ?Anesthesia Type:General ? ?Level of Consciousness: drowsy ? ?Airway & Oxygen Therapy: Patient Spontanous Breathing ? ?Post-op Assessment: Report given to RN and Post -op Vital signs reviewed and stable ? ?Post vital signs: Reviewed ? ?Last Vitals:  ?Vitals Value Taken Time  ?BP 127/88 04/10/21 0900  ?Temp    ?Pulse 69 04/10/21 0900  ?Resp 16 04/10/21 0900  ?SpO2 95 % 04/10/21 0900  ?Vitals shown include unvalidated device data. ? ?Last Pain:  ?Vitals:  ? 04/10/21 0900  ?TempSrc:   ?PainSc: 0-No pain  ?   ? ?  ? ?Complications: No notable events documented. ?

## 2021-04-11 ENCOUNTER — Telehealth: Payer: Self-pay

## 2021-04-11 ENCOUNTER — Ambulatory Visit: Payer: 59 | Admitting: Physician Assistant

## 2021-04-11 ENCOUNTER — Other Ambulatory Visit: Payer: Self-pay

## 2021-04-11 ENCOUNTER — Encounter: Payer: Self-pay | Admitting: Physician Assistant

## 2021-04-11 ENCOUNTER — Encounter
Admission: RE | Admit: 2021-04-11 | Discharge: 2021-04-11 | Disposition: A | Discharge status: Home / Self Care | Payer: 59 | Source: Ambulatory Visit | Attending: Surgery | Admitting: Surgery

## 2021-04-11 VITALS — BP 129/69 | HR 61 | Temp 98.3°F | Resp 16 | Ht 74.0 in | Wt 314.0 lb

## 2021-04-11 DIAGNOSIS — K429 Umbilical hernia without obstruction or gangrene: Secondary | ICD-10-CM

## 2021-04-11 DIAGNOSIS — Z01818 Encounter for other preprocedural examination: Secondary | ICD-10-CM

## 2021-04-11 DIAGNOSIS — I10 Essential (primary) hypertension: Secondary | ICD-10-CM

## 2021-04-11 DIAGNOSIS — K760 Fatty (change of) liver, not elsewhere classified: Secondary | ICD-10-CM

## 2021-04-11 DIAGNOSIS — K4020 Bilateral inguinal hernia, without obstruction or gangrene, not specified as recurrent: Secondary | ICD-10-CM

## 2021-04-11 DIAGNOSIS — Z6841 Body Mass Index (BMI) 40.0 and over, adult: Secondary | ICD-10-CM

## 2021-04-11 DIAGNOSIS — E1165 Type 2 diabetes mellitus with hyperglycemia: Secondary | ICD-10-CM

## 2021-04-11 DIAGNOSIS — Z79899 Other long term (current) drug therapy: Secondary | ICD-10-CM

## 2021-04-11 HISTORY — DX: Chronic pain syndrome: G89.4

## 2021-04-11 HISTORY — DX: Occlusion and stenosis of bilateral carotid arteries: I65.23

## 2021-04-11 LAB — POCT GLYCOSYLATED HEMOGLOBIN (HGB A1C): Hemoglobin A1C: 6.2 % — AB (ref 4.0–5.6)

## 2021-04-11 LAB — SURGICAL PATHOLOGY

## 2021-04-11 NOTE — Progress Notes (Cosign Needed)
[facility] [place], [place] [address]  Internal MEDICINE  Office Visit Note  Patient Name: Gerald [name]  [id number]  [id number]  Date of Service: 04/11/2021  Chief Complaint  Patient presents with   Follow-up    Surgical clearance for procedure on Tuesday   Abnormal Lab    Is concerned about recent lab results, would like to discuss .. labs were done by GI    HPI Pt is here for routine follow up as well as surgical clearance -He is having surgery for 2 inguinal hernias and umbilical hernia with plan for surgery to repair all 3 on Tuesday.  He reports it is the left sided inguinal hernia that seems to cause him pain and led to the original testing and diagnoses.  Does continue to have this lower left abdominal pain but otherwise feels well -states he had some labs done by GI that he is worried about results as several abnormal findings, but doesn't have visit for 3 months. Advised to call their office for results and next steps -Did have colonoscopy/endoscopy yesterday, states they did take some polyps -Pt is down 5 lbs since last visit and is working on continued weight loss goals -BP at home has been well controlled at home 130/60 -back up to walking 3 miles now that he is recovered from foot surgery, he reports no more follow ups with podiatry  -He does also mentions difficulty with erections recently and is hopeful that recovery after hernia surgery may help resolve this if continued problem may need to have some lab work and/or see urology  Current Medication: Outpatient Encounter Medications as of 04/11/2021  Medication Sig   buPROPion (WELLBUTRIN XL) 150 MG 24 hr tablet Take 1 tablet (150 mg total) by mouth daily.   citalopram (CELEXA) 40 MG tablet TAKE 1 TABLET BY MOUTH EVERY DAY FOR GAD   gabapentin (NEURONTIN) 100 MG capsule Take 200 mg by mouth 3 (three) times daily.   lisinopril-hydrochlorothiazide (ZESTORETIC) 20-25 MG tablet Take 1 tablet by  mouth in the morning and at bedtime.   Multiple Vitamins-Minerals (MULTIVITAMIN ADULTS PO) Take 1 tablet by mouth daily.   Na Sulfate-K Sulfate-Mg Sulf 17.5-3.13-1.6 GM/177ML SOLN PLEASE SEE ATTACHED FOR DETAILED DIRECTIONS   nitroGLYCERIN (NITROSTAT) 0.4 MG SL tablet Place 1 tablet (0.4 mg total) under the tongue every 5 (five) minutes x 3 doses as needed for chest pain.   No facility-administered encounter medications on file as of 04/11/2021.    Surgical History: Past Surgical History:  Procedure Laterality Date   BONE EXCISION Right 11/21/2020   Procedure: PART EXCISION BONE- PHALANX;  Surgeon: Samara Deist, DPM;  Location: [facility];  Service: Podiatry;  Laterality: Right;   CARDIAC CATHETERIZATION N/A 09/2011   [facility]; EF 50% with 30% mid LAD stenosis and no obstructive disease.   CARDIAC CATHETERIZATION  09/2010   [facility]; Mid LAD 40% stenosis; Mid Circumflex:Normal; Mid RCA; Normal   CARDIAC CATHETERIZATION     COLONOSCOPY     ESOPHAGOGASTRODUODENOSCOPY (EGD) WITH PROPOFOL N/A 06/10/2017   Procedure: ESOPHAGOGASTRODUODENOSCOPY (EGD) WITH PROPOFOL;  Surgeon: Jonathon Bellows, MD;  Location: [facility] ENDOSCOPY;  Service: Gastroenterology;  Laterality: N/A;   HAMMER TOE SURGERY Right 11/21/2020   Procedure: HAMMERTOE CORRECTION;  Surgeon: Samara Deist, DPM;  Location: [place];  Service: Podiatry;  Laterality: Right;   KNEE ARTHROSCOPY WITH MEDIAL MENISECTOMY Right 09/16/2012   Procedure: RIGHT KNEE ARTHROSCOPY WITH MEDIAL AND LATERAL MENISECTOMY, CHONDROPLASTY;  Surgeon: Ninetta Lights,  MD;  Location: [place];  Service: Orthopedics;  Laterality: Right;  RIGHT KNEE SCOPE MEDIAL MENISCECTOMY   LEFT HEART CATH AND CORONARY ANGIOGRAPHY N/A 06/11/2016   Procedure: Left Heart Cath and Coronary Angiography;  Surgeon: Minna Merritts, MD;  Location: [place] [facility];  Service: Cardiovascular;  Laterality: N/A;    Medical History: Past Medical History:   Diagnosis Date   Abnormal LFTs    Carcinoma ([place])    Diabetes mellitus without complication ([place])    borderline (11/14/20 - lost weight - no current issues)   Fundic gland polyps of stomach, benign    Gastritis    GERD (gastroesophageal reflux disease)    Hepatic steatosis    Hyperlipidemia    Hypertension    Nonischemic cardiomyopathy (HCC)    Previous ejection fraction of 35-40% on echo. 50% on cardiac catheterization in August of 2013   Obesity    Sleep apnea    Squamous cell carcinoma of skin 02/27/2020   left distal lat deltoid - [facility]    Family History: Family History  Problem Relation Age of Onset   Heart disease Father 33       MI   Heart attack Father    Stomach cancer Father    Hypertension Mother    Breast cancer Mother     Social History   Socioeconomic History   Marital status: Married    Spouse name: Not on file   Number of children: 3   Years of education: Not on file   Highest education level: Not on file  Occupational History   Occupation: Librarian, academic  Tobacco Use   Smoking status: Never   Smokeless tobacco: Former    Quit date: 1987  Scientific laboratory technician Use: Never used  Substance and Sexual Activity   Alcohol use: Not Currently    Alcohol/week: 0.0 standard drinks    Comment: rare   Drug use: No   Sexual activity: Not on file  Other Topics Concern   Not on file  Social History Narrative   Married.  43 yo son.   Wife on disability for bipolar disorder.     Social Determinants of Health   Financial Resource Strain: Not on file  Food Insecurity: Not on file  Transportation Needs: Not on file  Physical Activity: Not on file  Stress: Not on file  Social Connections: Not on file  Intimate Partner Violence: Not on file      Review of Systems  Constitutional:  Negative for chills, fatigue and unexpected weight change.  HENT:  Negative for congestion, postnasal drip, rhinorrhea, sneezing and sore throat.   Eyes:  Negative for  redness.  Respiratory:  Negative for cough, chest tightness and shortness of breath.   Cardiovascular:  Negative for chest pain and palpitations.  Gastrointestinal:  Positive for abdominal pain. Negative for constipation, diarrhea, nausea and vomiting.  Genitourinary:  Negative for dysuria and frequency.  Musculoskeletal:  Positive for arthralgias. Negative for back pain, joint swelling and neck pain.  Skin:  Negative for rash.  Neurological:  Positive for numbness. Negative for tremors.  Hematological:  Negative for adenopathy. Does not bruise/bleed easily.  Psychiatric/Behavioral:  Negative for behavioral problems (Depression) and suicidal ideas. The patient is not nervous/anxious.    Vital Signs: BP 129/69    Pulse 61    Temp 98.3 F (36.8 C)    Resp 16    Ht '6\' 2"'$  (1.88 m)    Wt Marland Kitchen)  314 lb (142.4 kg)    SpO2 97%    BMI 40.32 kg/m    Physical Exam Vitals and nursing note reviewed.  Constitutional:      General: He is not in acute distress.    Appearance: He is well-developed. He is obese. He is not diaphoretic.  HENT:     Head: Normocephalic and atraumatic.     Mouth/Throat:     Pharynx: No oropharyngeal exudate.  Eyes:     Pupils: Pupils are equal, round, and reactive to light.  Neck:     Thyroid: No thyromegaly.     Vascular: No JVD.     Trachea: No tracheal deviation.  Cardiovascular:     Rate and Rhythm: Normal rate and regular rhythm.     Heart sounds: Normal heart sounds. No murmur heard.   No friction rub. No gallop.  Pulmonary:     Effort: Pulmonary effort is normal. No respiratory distress.     Breath sounds: No wheezing or rales.  Chest:     Chest wall: No tenderness.  Abdominal:     General: Bowel sounds are normal.     Palpations: Abdomen is soft.  Musculoskeletal:        General: Normal range of motion.     Cervical back: Normal range of motion and neck supple.  Lymphadenopathy:     Cervical: No cervical adenopathy.  Skin:    General: Skin is warm and  dry.  Neurological:     Mental Status: He is alert and oriented to person, place, and time.     Cranial Nerves: No cranial nerve deficit.  Psychiatric:        Behavior: Behavior normal.        Thought Content: Thought content normal.        Judgment: Judgment normal.       Assessment/Plan: 1. Type 2 diabetes mellitus with hyperglycemia, unspecified whether long term insulin use (HCC) - POCT HgB A1C 6.2 which is improved from 6.9 at last check.  Continue to work on diet and exercise  2. Essential hypertension Well-controlled, continue current medication  3. Bilateral inguinal hernia without obstruction or gangrene, recurrence not specified Surgery for repair on Tuesday with general surgery  4. Umbilical hernia without obstruction and without gangrene Surgery for repair on Tuesday with general surgery  5. Hepatic steatosis Followed by GI with concerns for cirrhosis.  Patient also recently had multiple labs done via GI including a very elevated CK level, he is going to call them to discuss further and plan next steps.  Patient may need rheumatology referral  6. Morbid obesity with BMI of 40.0-44.9, adult ([place]) Has lost 5 pounds since last visit, will continue to work on weight loss goals with diet and exercise  7.  Preoperative clearance [facility] with overall moderate risk for surgery.  Blood pressure and sugars are optimized at this time however patient does have abnormal labs with GI with concern for liver cirrhosis as well as very elevated CK and is also morbidly obese which does increase risks.  General Counseling: benjamine strout understanding of the findings of todays visit and agrees with plan of treatment. I have discussed any further diagnostic evaluation that may be needed or ordered today. We also reviewed his medications today. he has been encouraged to call the office with any questions or concerns that should arise  related to todays visit.    Orders Placed This Encounter  Procedures  POCT HgB A1C    No orders of the defined types were placed in this encounter.   This patient was seen by Drema Dallas, PA-C in collaboration with Dr. Clayborn Bigness as a part of collaborative care agreement.   Total time spent:30 Minutes Time spent includes review of chart, medications, test results, and follow up plan with the patient.      Dr Lavera Guise Internal medicine

## 2021-04-11 NOTE — Patient Instructions (Addendum)
Your procedure is scheduled on:04-16-21 Tuesday ?Report to the Registration Desk on the 1st floor of the [facility].Then proceed to the 2nd floor Surgery Desk in the [facility] ?To find out your arrival time, please call [phone] between 1PM - 3PM on:04-15-21 Monday ? ?REMEMBER: ?Instructions that are not followed completely may result in serious medical risk, up to and including death; or upon the discretion of your surgeon and anesthesiologist your surgery may need to be rescheduled. ? ?Do not eat food after midnight the night before surgery.  ?No gum chewing, lozengers or hard candies. ? ?You may however, drink Water up to 2 hours before you are scheduled to arrive for your surgery. Do not drink anything within 2 hours of your scheduled arrival time. ? ?TAKE THESE MEDICATIONS THE MORNING OF SURGERY WITH A SIP OF WATER: ?-gabapentin (NEURONTIN)-take 300 mg the morning of surgery-Dr Piscoya has ordered 300 mg to be taken the day of surgery so take this at home so you wont get charged when you get here to the hospital ? ?One week prior to surgery: ?Stop Anti-inflammatories (NSAIDS) such as Advil, Aleve, Ibuprofen, Motrin, Naproxen, Naprosyn and Aspirin based products such as Excedrin, Goodys Powder, BC Powder.You may however, take Tylenol if needed for pain up until the day of surgery. ? ?Stop ANY OVER THE COUNTER supplements/vitamins NOW (04-11-21) until after surgery (MULTIVITAMIN) ?You may however, continue to take Tylenol if needed for pain up until the day of surgery. ? ?No Alcohol for 24 hours before or after surgery. ? ?No Smoking including e-cigarettes for 24 hours prior to surgery.  ?No chewable tobacco products for at least 6 hours prior to surgery.  ?No nicotine patches on the day of surgery. ? ?Do not use any "recreational" drugs for at least a week prior to your surgery.  ?Please be advised that the combination of cocaine and anesthesia may have negative outcomes, up to and including death. ?If  you test positive for cocaine, your surgery will be cancelled. ? ?On the morning of surgery brush your teeth with toothpaste and water, you may rinse your mouth with mouthwash if you wish. ?Do not swallow any toothpaste or mouthwash. ? ?Use CHG Soap as directed on instruction sheet. ? ?Do not wear jewelry, make-up, hairpins, clips or nail polish. ? ?Do not wear lotions, powders, or perfumes.  ? ?Do not shave body from the neck down 48 hours prior to surgery just in case you cut yourself which could leave a site for infection.  ?Also, freshly shaved skin may become irritated if using the CHG soap. ? ?Contact lenses, hearing aids and dentures may not be worn into surgery. ? ?Do not bring valuables to the hospital. [facility] is not responsible for any missing/lost belongings or valuables.  ? ?Bring your C-PAP to the hospital with you  ? ?Notify your doctor if there is any change in your medical condition (cold, fever, infection). ? ?Wear comfortable clothing (specific to your surgery type) to the hospital. ? ?After surgery, you can help prevent lung complications by doing breathing exercises.  ?Take deep breaths and cough every 1-2 hours. Your doctor may order a device called an Incentive Spirometer to help you take deep breaths. ?When coughing or sneezing, hold a pillow firmly against your incision with both hands. This is called ?splinting.? Doing this helps protect your incision. It also decreases belly discomfort. ? ?If you are being admitted to the hospital overnight, leave your suitcase in the car. ?After surgery it  may be brought to your room. ? ?If you are being discharged the day of surgery, you will not be allowed to drive home. ?You will need a responsible adult (18 years or older) to drive you home and stay with you that night.  ? ?If you are taking public transportation, you will need to have a responsible adult (18 years or older) with you. ?Please confirm with your physician that it is acceptable to  use public transportation.  ? ?Please call the [facility]. at [phone] if you have any questions about these instructions. ? ?Surgery Visitation Policy: ? ?Patients undergoing a surgery or procedure may have one family member or support person with them as long as that person is not COVID-19 positive or experiencing its symptoms.  ?That person may remain in the waiting area during the procedure and may rotate out with other people. ?

## 2021-04-11 NOTE — Telephone Encounter (Signed)
Surgical clearance form faxed back to [facility] at [phone] along with supporting office notes. Clearance placed in scan. ?

## 2021-04-12 ENCOUNTER — Encounter: Payer: Self-pay | Admitting: Surgery

## 2021-04-12 ENCOUNTER — Encounter
Admission: RE | Admit: 2021-04-12 | Discharge: 2021-04-12 | Disposition: A | Discharge status: Home / Self Care | Payer: 59 | Source: Ambulatory Visit | Attending: Surgery | Admitting: Surgery

## 2021-04-12 ENCOUNTER — Telehealth: Payer: Self-pay

## 2021-04-12 DIAGNOSIS — I10 Essential (primary) hypertension: Secondary | ICD-10-CM | POA: Insufficient documentation

## 2021-04-12 DIAGNOSIS — Z01812 Encounter for preprocedural laboratory examination: Secondary | ICD-10-CM | POA: Insufficient documentation

## 2021-04-12 DIAGNOSIS — Z79899 Other long term (current) drug therapy: Secondary | ICD-10-CM | POA: Diagnosis not present

## 2021-04-12 LAB — POTASSIUM: Potassium: 3.8 mmol/L (ref 3.5–5.1)

## 2021-04-12 NOTE — Progress Notes (Signed)
Perioperative Services  Pre-Admission/Anesthesia Testing Clinical Review  Date: 04/12/21  Patient Demographics:  Name: Gerald [name] DOB:   04/24/1965 MRN:   [id number]  Planned Surgical Procedure(s):    Case: [id number] Date/Time: 04/16/21 0715   Procedures:      XI ROBOTIC ASSISTED BILATERAL INGUINAL HERNIA (Bilateral)     HERNIA REPAIR UMBILICAL ADULT, open   Anesthesia type: General   Pre-op diagnosis: bilateral inguinal hernia, umbilical hernia, reducible less 3 cm   Location: [facility] OR ROOM 04 / [place] [facility] FOR ANESTHESIA [facility]   Surgeons: Olean Ree, MD   NOTE: Available PAT nursing documentation and vital signs have been reviewed. Clinical nursing staff has updated patient's PMH/PSHx, current medication list, and drug allergies/intolerances to ensure comprehensive history available to assist in medical decision making as it pertains to the aforementioned surgical procedure and anticipated anesthetic course. Extensive review of available clinical information performed. Gerald [name] PMH and PSHx updated with any diagnoses/procedures that  may have been inadvertently omitted during his intake with the pre-admission testing department's nursing staff.  Clinical Discussion:  Gerald [name] is a 56 y.o. male who is submitted for pre-surgical anesthesia review and clearance prior to him undergoing the above procedure. Patient has never been a smoker. Pertinent PMH includes: CAD, MI, NICM, atypical chest pain, carotid stenosis, HTN, HLD, borderline diabetes, GERD (no daily Tx), SOB, OSAH (requires nocturnal PAP therapy), chronic pain syndrome, OA, depression, anxiety.  Patient with a past medical history significant for multiple medical comorbidities, including cardiovascular disease.  Patient is not followed by cardiology.  Patient reporting that primary care physician Humphrey Rolls, MD) is managing his cardiovascular diagnoses.  Patient was last seen by his primary care provider on 04/11/2021;  notes reviewed.  At the time of his clinic visit, patient doing well overall.  His main complaint was abdominal pain related to inguinal and umbilical hernias.  He denied any episodes of chest pain, increase shortness of breath, PND, orthopnea, palpitations, significant peripheral edema, vertiginous symptoms, or presyncope/syncope.  As previously mentioned, patient does have a past medical history significant for cardiovascular diagnoses.  Patient reporting that he had an MI back in 2008.  At the time of this consult, I do not see any records to support this, however patient advising that he was "told by his regular doctor that he had a heart attack".  Patient has undergone multiple diagnostic heart catheterizations as follows:  Catheterization performed on 09/10/2010 revealed single-vessel CAD with a 40% stenosis of the mid LAD.  No intervention was required.  Catheterization performed on 09/12/2011 revealing a normal left ventricular systolic function with an EF of 60%.  There was multivessel CAD; 40% distal LAD, 30% proximal LCx, and 40% mid RCA.  Interestingly enough, there was mention on the cardiac catheterization report of in-stent restenosis of previously placed stents to the mid LAD (30%) and to the mid LCx (30%).  Catheterization performed on 05/23/2014 revealing a normal left ventricular systolic function with an EF of >55%.  There was a 30% stenosis of the proximal to mid LAD.  No intervention was required.  Catheterization performed on 06/11/2016 revealed abnormal left ventricular systolic function with an EF of 55 to 65%.  LVEDP was normal.  There was a 30 to 40% proximal to mid LAD stenosis noted.  No intervention was required.  Myocardial perfusion imaging study performed on 06/10/2016 revealing a moderately decreased left ventricular systolic function with an EF of 39%.  There was a large partially reversible defect in the  anterior and anterolateral walls, which was felt to potentially  represent artifact, ischemia, and potentially scar.  Study determined to be abnormal and potentially high risk.  Last TTE was performed on 01/08/2018 revealing a normal left ventricular systolic function with an EF of 60 to 65%.  There were no regional wall motion abnormalities. There is no evidence of significant valvular regurgitation or transvalvular gradient suggestive of stenosis.     Blood pressure well controlled at 129/69 on currently prescribed ACEi and diuretic therapies.  Patient not currently taking any type of prescribed lipid-lowering therapies for his HLD diagnosis or further ASCVD prevention.  Patient has SL NTG to use on a as needed basis, however denies recent use.  Patient has a prediabetes diagnosis that is managed with diet lifestyle modification; last HgbA1c 6.2% when checked on 04/11/2021.  Patient has an OSAH diagnosis and is reported to be compliant with prescribed nocturnal PAP therapy. Functional capacity, as defined by DASI, is documented as being >/= 4 METS.  PCP mentioned elevated LFTs during his visit.  Patient seeing GI and there is concern of possible cirrhosis.  No other changes were made to patient's medication regimen during the visit.  Patient to follow-up with primary care physician at regular defined intervals for ongoing management of his chronic medical conditions.  Gerald [name] is scheduled for elective ROBOTIC ASSISTED BILATERAL INGUINAL HERNIA REPAIR; UMBILICAL HERNIA REPAIR on 04/16/2021 with Dr. Ardath Sax, MD.  Given patient's past medical history significant for multiple medical comorbidities, including cardiovascular diagnoses being managed by his PCP, presurgical clearance was sought from his primary medical provider by the attending surgeons office and PAT team.  Per family/internal medicine, "blood pressure and sugars are optimized at this time, however patient does have abnormal labs with GI with concern for liver cirrhosis as well as a very elevated  CK, and is morbidly obese which increases his risk.  Patient is cleared to proceed with planned surgery at an overall MODERATE risk of potential complications".  In review of his medication reconciliation, it is noted the patient is not currently taking any type of anticoagulation or antiplatelet therapies that would need to be held during the perioperative course.  Patient denies previous perioperative complications with anesthesia in the past. In review of the available records, it is noted that patient underwent a general anesthetic course here (ASA III) in 04/2021 without documented complications.   Vitals with BMI 04/11/2021 04/10/2021 04/08/2021  Height 6' 2"  6' 2"  6' 2"   Weight 314 lbs 300 lbs 314 lbs  BMI 40.3 74.1 63.8  Systolic 453 646 803  Diastolic 69 71 90  Pulse 61 55 60    Providers/Specialists:   NOTE: Primary physician provider listed below. Patient may have been seen by APP or partner within same practice.   PROVIDER ROLE / SPECIALTY LAST Delight Stare, MD General Surgery (Surgeon) 04/08/2021  Lavera Guise, MD Primary Care Provider 04/11/2021  [place] Allergies:  Statins  Current Home Medications:   No current facility-administered medications for this encounter.    buPROPion (WELLBUTRIN XL) 150 MG 24 hr tablet   citalopram (CELEXA) 40 MG tablet   gabapentin (NEURONTIN) 100 MG capsule   lisinopril-hydrochlorothiazide (ZESTORETIC) 20-25 MG tablet   Multiple Vitamins-Minerals (MULTIVITAMIN ADULTS PO)   nitroGLYCERIN (NITROSTAT) 0.4 MG SL tablet   Na Sulfate-K Sulfate-Mg Sulf 17.5-3.13-1.6 GM/177ML SOLN   History:   Past Medical History:  Diagnosis Date   Abnormal LFTs    Anxiety    Atypical  chest pain    Borderline diabetes    CAD (coronary artery disease)    a.) PCI and stent placement (unknown type) to mLAD and mLCx; date unknown. b.) LHC 09/10/2010: 40% mLAD; no intervention. c.) LHC 09/12/2011: EF 60%; 30% ISR mLAD, 40% dLAD, 30% pLCx, 30% ISR mLCx, 40%  mRCA; no intervention. d.) LHC 05/23/2014: EF >55%; 30% p-mLAD; no intervention. e.) LHC 06/11/2016: EF 55-65%; LVEDP norm; 30-40% p-mLAD; no intervention.   Carcinoma ([place])    Chronic pain syndrome    Depression    Fundic gland polyps of stomach, benign    Gastritis    GERD (gastroesophageal reflux disease)    Hepatic steatosis    Hyperlipidemia    Hypertension    Migraines    Myocardial infarction [facility]) 2008   was told by PCP   Nonischemic cardiomyopathy ([place])    a.) TTE 09/12/2011: EF 35-45%. b.) LHC 09/12/2011: EF 60%. c.) TTE 06/04/2012: EF 55-60%. d.) LHC 05/23/2014: EF >55%. e.) TTE 06/11/2016: EF 55-60%; G1DD. f.) LHC 06/11/2016: EF 55-65%. g.) TTE 01/08/2018: EF 60-65%.   Obesity    Occlusion and stenosis of bilateral carotid arteries    OSA on CPAP    Osteoarthritis of both knees    Shortness of breath    Squamous cell carcinoma of skin 02/27/2020   left distal lat deltoid - [facility]   Past Surgical History:  Procedure Laterality Date   BONE EXCISION Right 11/21/2020   Procedure: PART EXCISION BONE- PHALANX;  Surgeon: Samara Deist, DPM;  Location: [place];  Service: Podiatry;  Laterality: Right;   CARDIAC CATHETERIZATION N/A 09/2011   [facility]; EF 50% with 30% mid LAD stenosis and no obstructive disease.   CARDIAC CATHETERIZATION  09/2010   [facility]; Mid LAD 40% stenosis; Mid Circumflex:Normal; Mid RCA; Normal   CARDIAC CATHETERIZATION     COLONOSCOPY     COLONOSCOPY WITH PROPOFOL N/A 04/10/2021   Procedure: COLONOSCOPY WITH PROPOFOL;  Surgeon: Jonathon Bellows, MD;  Location: [facility] ENDOSCOPY;  Service: Gastroenterology;  Laterality: N/A;   ESOPHAGOGASTRODUODENOSCOPY N/A 04/10/2021   Procedure: ESOPHAGOGASTRODUODENOSCOPY (EGD);  Surgeon: Jonathon Bellows, MD;  Location: [facility] ENDOSCOPY;  Service: Gastroenterology;  Laterality: N/A;   ESOPHAGOGASTRODUODENOSCOPY (EGD) WITH PROPOFOL N/A 06/10/2017   Procedure: ESOPHAGOGASTRODUODENOSCOPY (EGD) WITH PROPOFOL;  Surgeon: Jonathon Bellows, MD;   Location: [facility] END[facility]COPY;  Service: Gastroenterology;  Laterality: N/A;   HAMMER TOE SURGERY Right 11/21/2020   Procedure: HAMMERTOE CORRECTION;  Surgeon: Samara Deist, DPM;  Location: [place];  Service: Podiatry;  Laterality: Right;   KNEE ARTHROSCOPY WITH MEDIAL MENISECTOMY Right 09/16/2012   Procedure: RIGHT KNEE ARTHROSCOPY WITH MEDIAL AND LATERAL MENISECTOMY, CHONDROPLASTY;  Surgeon: Ninetta Lights, MD;  Location: [place];  Service: Orthopedics;  Laterality: Right;  RIGHT KNEE SCOPE MEDIAL MENISCECTOMY   LEFT HEART CATH AND CORONARY ANGIOGRAPHY N/A 06/11/2016   Procedure: Left Heart Cath and Coronary Angiography;  Surgeon: Minna Merritts, MD;  Location: [place] CV LAB;  Service: Cardiovascular;  Laterality: N/A;   Family History  Problem Relation Age of Onset   Heart disease Father 76       MI   Heart attack Father    Stomach cancer Father    Hypertension Mother    Breast cancer Mother    Social History   Tobacco Use   Smoking status: Never   Smokeless tobacco: Former    Quit date: 1987  Vaping Use   Vaping Use: Never used  Substance Use Topics  Alcohol use: Not Currently   Drug use: No    Pertinent Clinical Results:  LABS: Labs reviewed: Acceptable for surgery.  Component Date Value Ref Range Status   Potassium 04/12/2021 3.8  3.5 - 5.1 mmol/L Final   Performed at [facility], [address] [place], [place] [address]  Office Visit on 04/11/2021  Component Date Value Ref Range Status   Hemoglobin A1C 04/11/2021 6.2 (A)  4.0 - 5.6 % Final   Lab Results  Component Value Date   WBC 9.6 02/28/2021   HGB 14.6 02/28/2021   HCT 43.1 02/28/2021   MCV 88.3 02/28/2021   PLT 232 02/28/2021    Lab Results  Component Value Date   NA 136 02/28/2021   K 3.8 04/12/2021   CO2 29 02/28/2021   GLUCOSE 155 (H) 02/28/2021   BUN 18 02/28/2021   CREATININE 1.12 02/28/2021   CALCIUM 9.1 02/28/2021   EGFR 91 08/14/2020    GFRNONAA >60 02/28/2021     ECG: Date: 02/28/2021 Time ECG obtained: 1716 PM Rate: 74 bpm Rhythm: normal sinus Axis (leads I and aVF): Normal Intervals: PR 202 ms. QRS 86 ms. QTc 452 ms. ST segment and T wave changes: No evidence of acute ST segment elevation or depression.  Evidence of an age undetermined septal infarct present. Comparison: Similar to previous tracing obtained on 08/30/2018.   IMAGING / PROCEDURES: CT ABDOMEN AND PELVIS WITH CONTRAST performed on 02/28/2021 No acute abnormality or explanation for LEFT lower quadrant pain Hepatic steatosis.  Slight liver capsular nodularity raises concern for cirrhosis.  Recommend correlation with cirrhosis risk factors.   Small fat-containing umbilical and BILATERAL inguinal hernias. Aortic atherosclerosis  DIAGNOSTIC RADIOGRAPHS OF CHEST 2 VIEWS performed on 02/28/2021 Normal-sized heart Clear lungs with normal vascularity Thoracic spine degenerative changes No acute abnormality  TRANSTHORACIC ECHOCARDIOGRAM performed on 01/08/2018 LVEF 60-65% no regional wall motion abnormalities.   Study not technically sufficient to allow evaluation of LV diastolic function Left atrium normal in size Right ventricular systolic function normal Right ventricular systolic pressure could not be accurately estimated. No evidence of valvular regurgitation or stenosis No pericardial effusion  LEFT HEART CATHETERIZATION AND CORONARY ANGIOGRAPHY performed on 06/11/2016 LVEF 55-65% LVEDP normal No aortic valve stenosis or regurgitation No mitral valve stenosis regurgitation or prolapse evident Single-vessel CAD 30% stenosis of the proximal LAD  Impression and Plan:  Gerald [name] has been referred for pre-anesthesia review and clearance prior to him undergoing the planned anesthetic and procedural courses. Available labs, pertinent testing, and imaging results were personally reviewed by me. This patient has been appropriately cleared by  internal/family medicine overall MODERATE risk of significant perioperative complications.  Based on clinical review performed today (04/12/21), barring any significant acute changes in the patient's overall condition, it is anticipated that he will be able to proceed with the planned surgical intervention. Any acute changes in clinical condition may necessitate his procedure being postponed and/or cancelled. Patient will meet with anesthesia team (MD and/or CRNA) on the day of his procedure for preoperative evaluation/assessment. Questions regarding anesthetic course will be fielded at that time.   Pre-surgical instructions were reviewed with the patient during his PAT appointment and questions were fielded by PAT clinical staff. Patient was advised that if any questions or concerns arise prior to his procedure then he should return a call to PAT and/or his surgeon's office to discuss.  Honor Loh, MSN, APRN, FNP-C, CEN [place]  Peri-operative Services Nurse Practitioner Phone: [phone]  Fax: [phone] 04/12/21 1:24 PM  NOTE: This note has been prepared using [employer]. Despite my best ability to proofread, there is always the potential that unintentional transcriptional errors may still occur from this process.

## 2021-04-12 NOTE — Telephone Encounter (Signed)
Medical Clearance received from [facility] and blood pressure are well controlled CK elevated concerns for liver cirrhosis-medium risk-patient optimized for surgery.  ?

## 2021-04-15 ENCOUNTER — Telehealth: Payer: Self-pay

## 2021-04-15 DIAGNOSIS — K7460 Unspecified cirrhosis of liver: Secondary | ICD-10-CM

## 2021-04-15 MED ORDER — CHLORHEXIDINE GLUCONATE CLOTH 2 % EX PADS: 6.0000 | MEDICATED_PAD | Freq: Once | CUTANEOUS | Status: DC

## 2021-04-15 MED ORDER — ACETAMINOPHEN 500 MG PO TABS: 1000.0000 mg | ORAL_TABLET | ORAL | Status: AC

## 2021-04-15 MED ORDER — GABAPENTIN 300 MG PO CAPS: 300.0000 mg | ORAL_CAPSULE | ORAL | Status: AC

## 2021-04-15 MED ORDER — BUPIVACAINE LIPOSOME 1.3 % IJ SUSP: 20.0000 mL | Freq: Once | INTRAMUSCULAR | Status: DC

## 2021-04-15 MED ORDER — CEFAZOLIN IN SODIUM CHLORIDE 3-0.9 GM/100ML-% IV SOLN
3.0000 g | INTRAVENOUS | Status: AC
  Administered 2021-04-16: 3 g via INTRAVENOUS
  Filled 2021-04-15: qty 100

## 2021-04-15 MED ORDER — ORAL CARE MOUTH RINSE: 15.0000 mL | Freq: Once | OROMUCOSAL | Status: AC

## 2021-04-15 MED ORDER — CHLORHEXIDINE GLUCONATE 0.12 % MT SOLN: 15.0000 mL | Freq: Once | OROMUCOSAL | Status: AC

## 2021-04-15 MED ORDER — SODIUM CHLORIDE 0.9 % IV SOLN: INTRAVENOUS | Status: DC

## 2021-04-15 MED ORDER — FAMOTIDINE 20 MG PO TABS: 20.0000 mg | ORAL_TABLET | Freq: Once | ORAL | Status: AC

## 2021-04-15 NOTE — Telephone Encounter (Signed)
-----   Message from Jonathon Bellows, MD sent at 04/05/2021 11:13 AM EST ----- ?Needs Hep A/B vaccine ?

## 2021-04-15 NOTE — Telephone Encounter (Signed)
Called patient to let him know about his lab tests results and Dr. Georgeann Oppenheim recommendations. Patient stated that he would get his vaccines either with his PCP or when he comes back to see Dr. Vicente Males. Patient had no further questions. ?

## 2021-04-16 ENCOUNTER — Ambulatory Visit: Payer: 59 | Admitting: Urgent Care

## 2021-04-16 ENCOUNTER — Encounter
Admission: RE | Disposition: A | Discharge status: Home / Self Care | Payer: Self-pay | Source: Home / Self Care | Attending: Surgery

## 2021-04-16 ENCOUNTER — Ambulatory Visit
Admission: RE | Admit: 2021-04-16 | Discharge: 2021-04-16 | Disposition: A | Discharge status: Home / Self Care | Payer: 59 | Attending: Surgery | Admitting: Surgery

## 2021-04-16 ENCOUNTER — Other Ambulatory Visit: Payer: Self-pay

## 2021-04-16 DIAGNOSIS — Z6841 Body Mass Index (BMI) 40.0 and over, adult: Secondary | ICD-10-CM | POA: Insufficient documentation

## 2021-04-16 DIAGNOSIS — K760 Fatty (change of) liver, not elsewhere classified: Secondary | ICD-10-CM | POA: Insufficient documentation

## 2021-04-16 DIAGNOSIS — Z955 Presence of coronary angioplasty implant and graft: Secondary | ICD-10-CM | POA: Insufficient documentation

## 2021-04-16 DIAGNOSIS — I252 Old myocardial infarction: Secondary | ICD-10-CM | POA: Insufficient documentation

## 2021-04-16 DIAGNOSIS — F32A Depression, unspecified: Secondary | ICD-10-CM | POA: Insufficient documentation

## 2021-04-16 DIAGNOSIS — I10 Essential (primary) hypertension: Secondary | ICD-10-CM | POA: Diagnosis not present

## 2021-04-16 DIAGNOSIS — E119 Type 2 diabetes mellitus without complications: Secondary | ICD-10-CM | POA: Diagnosis not present

## 2021-04-16 DIAGNOSIS — G4733 Obstructive sleep apnea (adult) (pediatric): Secondary | ICD-10-CM | POA: Diagnosis not present

## 2021-04-16 DIAGNOSIS — E785 Hyperlipidemia, unspecified: Secondary | ICD-10-CM | POA: Diagnosis not present

## 2021-04-16 DIAGNOSIS — Z85828 Personal history of other malignant neoplasm of skin: Secondary | ICD-10-CM | POA: Insufficient documentation

## 2021-04-16 DIAGNOSIS — G894 Chronic pain syndrome: Secondary | ICD-10-CM | POA: Insufficient documentation

## 2021-04-16 DIAGNOSIS — Z79899 Other long term (current) drug therapy: Secondary | ICD-10-CM | POA: Insufficient documentation

## 2021-04-16 DIAGNOSIS — F419 Anxiety disorder, unspecified: Secondary | ICD-10-CM | POA: Insufficient documentation

## 2021-04-16 DIAGNOSIS — I2510 Atherosclerotic heart disease of native coronary artery without angina pectoris: Secondary | ICD-10-CM | POA: Diagnosis not present

## 2021-04-16 DIAGNOSIS — K219 Gastro-esophageal reflux disease without esophagitis: Secondary | ICD-10-CM | POA: Insufficient documentation

## 2021-04-16 DIAGNOSIS — K4020 Bilateral inguinal hernia, without obstruction or gangrene, not specified as recurrent: Secondary | ICD-10-CM | POA: Diagnosis present

## 2021-04-16 DIAGNOSIS — K429 Umbilical hernia without obstruction or gangrene: Secondary | ICD-10-CM | POA: Diagnosis not present

## 2021-04-16 HISTORY — DX: Depression, unspecified: F32.A

## 2021-04-16 HISTORY — DX: Shortness of breath: R06.02

## 2021-04-16 HISTORY — DX: Bilateral primary osteoarthritis of knee: M17.0

## 2021-04-16 HISTORY — DX: Migraine, unspecified, not intractable, without status migrainosus: G43.909

## 2021-04-16 HISTORY — DX: Prediabetes: R73.03

## 2021-04-16 HISTORY — DX: Other chest pain: R07.89

## 2021-04-16 HISTORY — DX: Obstructive sleep apnea (adult) (pediatric): G47.33

## 2021-04-16 HISTORY — DX: Anxiety disorder, unspecified: F41.9

## 2021-04-16 HISTORY — DX: Atherosclerotic heart disease of native coronary artery without angina pectoris: I25.10

## 2021-04-16 SURGERY — REPAIR, HERNIA, INGUINAL, BILATERAL, ROBOT-ASSISTED
Anesthesia: General

## 2021-04-16 MED ORDER — FAMOTIDINE 20 MG PO TABS
ORAL_TABLET | ORAL | Status: AC
  Administered 2021-04-16: 20 mg via ORAL
  Filled 2021-04-16: qty 1

## 2021-04-16 MED ORDER — DEXMEDETOMIDINE (PRECEDEX) IN NS 20 MCG/5ML (4 MCG/ML) IV SYRINGE
PREFILLED_SYRINGE | INTRAVENOUS | Status: DC | PRN
  Administered 2021-04-16 (×3): 4 ug via INTRAVENOUS

## 2021-04-16 MED ORDER — LIDOCAINE HCL (CARDIAC) PF 100 MG/5ML IV SOSY
PREFILLED_SYRINGE | INTRAVENOUS | Status: DC | PRN
  Administered 2021-04-16: 50 mg via INTRAVENOUS

## 2021-04-16 MED ORDER — SUGAMMADEX SODIUM 200 MG/2ML IV SOLN
INTRAVENOUS | Status: DC | PRN
  Administered 2021-04-16: 200 mg via INTRAVENOUS

## 2021-04-16 MED ORDER — PROPOFOL 10 MG/ML IV BOLUS
INTRAVENOUS | Status: AC
  Filled 2021-04-16: qty 20

## 2021-04-16 MED ORDER — ACETAMINOPHEN 500 MG PO TABS: 1000.0000 mg | ORAL_TABLET | Freq: Four times a day (QID) | ORAL | Status: DC | PRN

## 2021-04-16 MED ORDER — ROCURONIUM BROMIDE 10 MG/ML (PF) SYRINGE
PREFILLED_SYRINGE | INTRAVENOUS | Status: AC
  Filled 2021-04-16: qty 10

## 2021-04-16 MED ORDER — PHENYLEPHRINE 40 MCG/ML (10ML) SYRINGE FOR IV PUSH (FOR BLOOD PRESSURE SUPPORT)
PREFILLED_SYRINGE | INTRAVENOUS | Status: DC | PRN
  Administered 2021-04-16: 80 ug via INTRAVENOUS

## 2021-04-16 MED ORDER — OXYCODONE HCL 5 MG PO TABS: 5.0000 mg | ORAL_TABLET | ORAL | 0 refills | Status: DC | PRN

## 2021-04-16 MED ORDER — CHLORHEXIDINE GLUCONATE 0.12 % MT SOLN
OROMUCOSAL | Status: AC
  Administered 2021-04-16: 15 mL via OROMUCOSAL
  Filled 2021-04-16: qty 15

## 2021-04-16 MED ORDER — KETAMINE HCL 10 MG/ML IJ SOLN
INTRAMUSCULAR | Status: DC | PRN
  Administered 2021-04-16: 20 mg via INTRAVENOUS
  Administered 2021-04-16: 30 mg via INTRAVENOUS

## 2021-04-16 MED ORDER — BUPIVACAINE LIPOSOME 1.3 % IJ SUSP
INTRAMUSCULAR | Status: DC | PRN
  Administered 2021-04-16: 20 mL

## 2021-04-16 MED ORDER — GABAPENTIN 300 MG PO CAPS
ORAL_CAPSULE | ORAL | Status: AC
  Administered 2021-04-16: 300 mg via ORAL
  Filled 2021-04-16: qty 1

## 2021-04-16 MED ORDER — ACETAMINOPHEN 10 MG/ML IV SOLN: 1000.0000 mg | Freq: Once | INTRAVENOUS | Status: DC | PRN

## 2021-04-16 MED ORDER — FENTANYL CITRATE (PF) 100 MCG/2ML IJ SOLN
INTRAMUSCULAR | Status: AC
  Administered 2021-04-16: 25 ug via INTRAVENOUS
  Filled 2021-04-16: qty 2

## 2021-04-16 MED ORDER — DEXAMETHASONE SODIUM PHOSPHATE 10 MG/ML IJ SOLN
INTRAMUSCULAR | Status: DC | PRN
  Administered 2021-04-16: 10 mg via INTRAVENOUS

## 2021-04-16 MED ORDER — BUPIVACAINE-EPINEPHRINE (PF) 0.5% -1:200000 IJ SOLN
INTRAMUSCULAR | Status: DC | PRN
Start: 2021-04-16 — End: 2021-04-16
  Administered 2021-04-16: 30 mL

## 2021-04-16 MED ORDER — MIDAZOLAM HCL 2 MG/2ML IJ SOLN
INTRAMUSCULAR | Status: DC | PRN
  Administered 2021-04-16: 2 mg via INTRAVENOUS

## 2021-04-16 MED ORDER — ROCURONIUM BROMIDE 100 MG/10ML IV SOLN
INTRAVENOUS | Status: DC | PRN
  Administered 2021-04-16: 30 mg via INTRAVENOUS
  Administered 2021-04-16: 20 mg via INTRAVENOUS
  Administered 2021-04-16: 100 mg via INTRAVENOUS

## 2021-04-16 MED ORDER — BUPIVACAINE LIPOSOME 1.3 % IJ SUSP
INTRAMUSCULAR | Status: AC
  Filled 2021-04-16: qty 20

## 2021-04-16 MED ORDER — OXYCODONE HCL 5 MG PO TABS: 5.0000 mg | ORAL_TABLET | Freq: Once | ORAL | Status: AC | PRN

## 2021-04-16 MED ORDER — ACETAMINOPHEN 500 MG PO TABS
ORAL_TABLET | ORAL | Status: AC
  Administered 2021-04-16: 1000 mg via ORAL
  Filled 2021-04-16: qty 2

## 2021-04-16 MED ORDER — FENTANYL CITRATE (PF) 100 MCG/2ML IJ SOLN
25.0000 ug | INTRAMUSCULAR | Status: DC | PRN
  Administered 2021-04-16 (×3): 25 ug via INTRAVENOUS

## 2021-04-16 MED ORDER — PHENYLEPHRINE HCL-NACL 20-0.9 MG/250ML-% IV SOLN
INTRAVENOUS | Status: DC | PRN
  Administered 2021-04-16: 20 ug/min via INTRAVENOUS

## 2021-04-16 MED ORDER — PHENYLEPHRINE HCL-NACL 20-0.9 MG/250ML-% IV SOLN
INTRAVENOUS | Status: AC
  Filled 2021-04-16: qty 250

## 2021-04-16 MED ORDER — EPHEDRINE SULFATE (PRESSORS) 50 MG/ML IJ SOLN
INTRAMUSCULAR | Status: DC | PRN
  Administered 2021-04-16 (×2): 10 mg via INTRAVENOUS
  Administered 2021-04-16: 5 mg via INTRAVENOUS

## 2021-04-16 MED ORDER — KETOROLAC TROMETHAMINE 30 MG/ML IJ SOLN
INTRAMUSCULAR | Status: DC | PRN
  Administered 2021-04-16: 30 mg via INTRAVENOUS

## 2021-04-16 MED ORDER — IBUPROFEN 800 MG PO TABS: 800.0000 mg | ORAL_TABLET | Freq: Three times a day (TID) | ORAL | 1 refills | Status: DC | PRN

## 2021-04-16 MED ORDER — KETAMINE HCL 50 MG/5ML IJ SOSY
PREFILLED_SYRINGE | INTRAMUSCULAR | Status: AC
  Filled 2021-04-16: qty 5

## 2021-04-16 MED ORDER — BUPIVACAINE-EPINEPHRINE (PF) 0.5% -1:200000 IJ SOLN
INTRAMUSCULAR | Status: AC
  Filled 2021-04-16: qty 30

## 2021-04-16 MED ORDER — ONDANSETRON HCL 4 MG/2ML IJ SOLN: 4.0000 mg | Freq: Once | INTRAMUSCULAR | Status: DC | PRN

## 2021-04-16 MED ORDER — FENTANYL CITRATE (PF) 100 MCG/2ML IJ SOLN
INTRAMUSCULAR | Status: AC
  Filled 2021-04-16: qty 2

## 2021-04-16 MED ORDER — FENTANYL CITRATE (PF) 100 MCG/2ML IJ SOLN
INTRAMUSCULAR | Status: DC | PRN
  Administered 2021-04-16: 100 ug via INTRAVENOUS

## 2021-04-16 MED ORDER — OXYCODONE HCL 5 MG PO TABS
ORAL_TABLET | ORAL | Status: AC
  Administered 2021-04-16: 5 mg via ORAL
  Filled 2021-04-16: qty 1

## 2021-04-16 MED ORDER — ONDANSETRON HCL 4 MG/2ML IJ SOLN
INTRAMUSCULAR | Status: DC | PRN
  Administered 2021-04-16: 4 mg via INTRAVENOUS

## 2021-04-16 MED ORDER — OXYCODONE HCL 5 MG/5ML PO SOLN: 5.0000 mg | Freq: Once | ORAL | Status: AC | PRN

## 2021-04-16 MED ORDER — PROPOFOL 10 MG/ML IV BOLUS
INTRAVENOUS | Status: DC | PRN
  Administered 2021-04-16: 200 mg via INTRAVENOUS

## 2021-04-16 MED ORDER — PHENYLEPHRINE HCL (PRESSORS) 10 MG/ML IV SOLN
INTRAVENOUS | Status: AC
  Filled 2021-04-16: qty 1

## 2021-04-16 MED ORDER — MIDAZOLAM HCL 2 MG/2ML IJ SOLN
INTRAMUSCULAR | Status: AC
  Filled 2021-04-16: qty 2

## 2021-04-16 SURGICAL SUPPLY — 75 items
ADH SKN CLS APL DERMABOND .7 (GAUZE/BANDAGES/DRESSINGS) ×1
BAG RETRIEVAL 10 (BASKET) ×1
BLADE SURG 15 STRL LF DISP TIS (BLADE) ×3 IMPLANT
BLADE SURG 15 STRL SS (BLADE) ×4
CANNULA REDUC XI 12-8 STAPL (CANNULA) ×4
CANNULA REDUCER 12-8 DVNC XI (CANNULA) ×3 IMPLANT
CHLORAPREP W/TINT 26 (MISCELLANEOUS) ×3 IMPLANT
COVER TIP SHEARS 8 DVNC (MISCELLANEOUS) ×3 IMPLANT
COVER TIP SHEARS 8MM DA VINCI (MISCELLANEOUS) ×4
COVER WAND RF STERILE (DRAPES) ×3 IMPLANT
DEFOGGER SCOPE WARMER CLEARIFY (MISCELLANEOUS) ×4 IMPLANT
DERMABOND ADVANCED (GAUZE/BANDAGES/DRESSINGS) ×1
DERMABOND ADVANCED .7 DNX12 (GAUZE/BANDAGES/DRESSINGS) ×3 IMPLANT
DRAPE ARM DVNC X/XI (DISPOSABLE) ×9 IMPLANT
DRAPE COLUMN DVNC XI (DISPOSABLE) ×3 IMPLANT
DRAPE DA VINCI XI ARM (DISPOSABLE) ×12
DRAPE DA VINCI XI COLUMN (DISPOSABLE) ×4
DRAPE LAPAROTOMY 77X122 PED (DRAPES) ×3 IMPLANT
ELECT CAUTERY BLADE TIP 2.5 (TIP) ×4
ELECT REM PT RETURN 9FT ADLT (ELECTROSURGICAL) ×4
ELECTRODE CAUTERY BLDE TIP 2.5 (TIP) ×3 IMPLANT
ELECTRODE REM PT RTRN 9FT ADLT (ELECTROSURGICAL) ×3 IMPLANT
GAUZE 4X4 16PLY ~~LOC~~+RFID DBL (SPONGE) ×4 IMPLANT
GLOVE SURG SYN 7.0 (GLOVE) ×16 IMPLANT
GLOVE SURG SYN 7.0 PF PI (GLOVE) ×6 IMPLANT
GLOVE SURG SYN 7.5  E (GLOVE) ×16
GLOVE SURG SYN 7.5 E (GLOVE) ×12 IMPLANT
GLOVE SURG SYN 7.5 PF PI (GLOVE) ×6 IMPLANT
GOWN STRL REUS W/ TWL LRG LVL3 (GOWN DISPOSABLE) ×12 IMPLANT
GOWN STRL REUS W/TWL LRG LVL3 (GOWN DISPOSABLE) ×16
IRRIGATION STRYKERFLOW (MISCELLANEOUS) ×3 IMPLANT
IRRIGATOR STRYKERFLOW (MISCELLANEOUS)
IV NS 1000ML (IV SOLUTION)
IV NS 1000ML BAXH (IV SOLUTION) IMPLANT
KIT PINK PAD W/HEAD ARE REST (MISCELLANEOUS) ×4
KIT PINK PAD W/HEAD ARM REST (MISCELLANEOUS) ×3 IMPLANT
LABEL OR SOLS (LABEL) ×4 IMPLANT
MANIFOLD NEPTUNE II (INSTRUMENTS) ×4 IMPLANT
MESH 3DMAX 4X6 LT LRG (Mesh General) ×1 IMPLANT
MESH 3DMAX 4X6 RT LRG (Mesh General) ×1 IMPLANT
MESH 3DMAX MID 4X6 LT LRG (Mesh General) IMPLANT
MESH 3DMAX MID 4X6 RT LRG (Mesh General) IMPLANT
NDL INSUFFLATION 14GA 120MM (NEEDLE) ×3 IMPLANT
NEEDLE HYPO 22GX1.5 SAFETY (NEEDLE) ×4 IMPLANT
NEEDLE INSUFFLATION 14GA 120MM (NEEDLE) ×4 IMPLANT
NS IRRIG 500ML POUR BTL (IV SOLUTION) ×4 IMPLANT
OBTURATOR OPTICAL STANDARD 8MM (TROCAR) ×4
OBTURATOR OPTICAL STND 8 DVNC (TROCAR) ×3
OBTURATOR OPTICALSTD 8 DVNC (TROCAR) ×3 IMPLANT
PACK BASIN MINOR ARMC (MISCELLANEOUS) ×4 IMPLANT
PACK LAP CHOLECYSTECTOMY (MISCELLANEOUS) ×4 IMPLANT
PENCIL ELECTRO HAND CTR (MISCELLANEOUS) ×4 IMPLANT
SEAL CANN UNIV 5-8 DVNC XI (MISCELLANEOUS) ×9 IMPLANT
SEAL XI 5MM-8MM UNIVERSAL (MISCELLANEOUS) ×12
SET TUBE SMOKE EVAC HIGH FLOW (TUBING) ×4 IMPLANT
SOLUTION ELECTROLUBE (MISCELLANEOUS) ×4 IMPLANT
SPONGE T-LAP 18X18 ~~LOC~~+RFID (SPONGE) ×3 IMPLANT
STAPLER CANNULA SEAL DVNC XI (STAPLE) ×3 IMPLANT
STAPLER CANNULA SEAL XI (STAPLE) ×4
SUT ETHIBOND 0 MO6 C/R (SUTURE) ×3 IMPLANT
SUT MNCRL AB 4-0 PS2 18 (SUTURE) ×4 IMPLANT
SUT VIC AB 2-0 SH 27 (SUTURE) ×8
SUT VIC AB 2-0 SH 27XBRD (SUTURE) ×6 IMPLANT
SUT VIC AB 3-0 SH 27 (SUTURE) ×4
SUT VIC AB 3-0 SH 27X BRD (SUTURE) ×3 IMPLANT
SUT VICRYL 0 AB UR-6 (SUTURE) ×8 IMPLANT
SUT VLOC 90 S/L VL9 GS22 (SUTURE) ×5 IMPLANT
SYR 20ML LL LF (SYRINGE) ×4 IMPLANT
SYR BULB IRRIG 60ML STRL (SYRINGE) ×3 IMPLANT
SYS BAG RETRIEVAL 10MM (BASKET) ×3
SYSTEM BAG RETRIEVAL 10MM (BASKET) IMPLANT
TAPE TRANSPORE STRL 2 31045 (GAUZE/BANDAGES/DRESSINGS) ×4 IMPLANT
TRAY FOLEY SLVR 16FR LF STAT (SET/KITS/TRAYS/PACK) ×4 IMPLANT
TROCAR BALLN GELPORT 12X130M (ENDOMECHANICALS) ×4 IMPLANT
WATER STERILE IRR 500ML POUR (IV SOLUTION) ×3 IMPLANT

## 2021-04-16 NOTE — Transfer of Care (Signed)
Immediate Anesthesia Transfer of Care Note ? ?Patient: Gerald [name] ? ?Procedure(s) Performed: XI ROBOTIC ASSISTED BILATERAL INGUINAL HERNIA (Bilateral) ?HERNIA REPAIR UMBILICAL ADULT, open ?INSERTION OF MESH ? ?Patient Location: PACU ? ?Anesthesia Type:General ? ?Level of Consciousness: awake and drowsy ? ?Airway & Oxygen Therapy: Patient Spontanous Breathing and Patient connected to face mask oxygen ? ?Post-op Assessment: Report given to RN and Post -op Vital signs reviewed and stable ? ?Post vital signs: Reviewed and stable ? ?Last Vitals:  ?Vitals Value Taken Time  ?BP 154/77 04/16/21 1038  ?Temp    ?Pulse 78 04/16/21 1040  ?Resp 23 04/16/21 1040  ?SpO2 94 % 04/16/21 1040  ?Vitals shown include unvalidated device data. ? ?Last Pain:  ?Vitals:  ? 04/16/21 0632  ?TempSrc: Temporal  ?PainSc: 7   ?   ? ?  ? ?Complications: No notable events documented. ?

## 2021-04-16 NOTE — Op Note (Signed)
?Procedure Date:  04/16/2021 ? ?Pre-operative Diagnosis:  Reducible Bilateral inguinal hernia, umbilical hernia ? ?Post-operative Diagnosis:  Reducible Bilateral inguinal hernia, umbilical hernia ? ?Procedure: ?1.  Robotic assisted Bilateral Inguinal Hernia Repair ?2.  Creation of Bilateral Posterior Rectus-Transversalis Fascia Advancment Flap for Coverage of Pelvic Wound (200 cm?) ?3.  Open umbilical hernia repair. ? ?Surgeon:  Melvyn Neth, MD ? ?Assistant:  Elie Goody, PA-S ? ?Anesthesia:  General endotracheal ? ?Estimated Blood Loss:  10 ml ? ?Specimens:  None ? ?Complications:  None ? ?Indications for Procedure:  This is a 56 y.o. male who presents with a bilateral inguinal hernia and umbilical hernia, all reducible.  The options of surgery versus observation were reviewed with the patient and/or family. The risks of bleeding, abscess or infection, recurrence of symptoms, potential for an open procedure, injury to surrounding structures, and chronic pain were all discussed with the patient and he was willing to proceed.  We have planned this transabdominal procedure with the creation of bilateral peritoneal flap based on the posterior rectus sheath and transversalis fascia in order to fully cover the mesh, creating a natural tisssue barrier for the bowel and peritoneal cavity. ? ?Description of Procedure: ?The patient was correctly identified in the preoperative area and brought into the operating room.  The patient was placed supine with VTE prophylaxis in place.  Appropriate time-outs were performed.  Anesthesia was induced and the patient was intubated.  Foley catheter was placed.  Appropriate antibiotics were infused. ? ?The abdomen was prepped and draped in a sterile fashion. A supraumbilical incision was made. Cautery was used to dissect along the umbilical stalk and to separate it from the underlying fascia, revealing a small 1 cm hernia defect.  Cautery was used to clear the fascial edges and a  Hasson trocar was inserted.  Pneumoperitoneum was obtained with appropriate opening pressures.  A Veress needle was used to start dissecting the peritoneal flap bilaterally.  Two 8-mm robotic ports were placed in the right and left lateral positions under direct visualization.  A large right and left 3D Max Mid Bard Mesh, a 2-0 Vicryl, and two 2-0 vloc sutures were placed through the umbilical port under direct visualization.  The [employer] platform was docked onto the patient, the camera was inserted and targeted, and the instruments were placed under direct visualization. ? ?Both inguinal regions were inspected for hernias and it was confirmed that the patient had bilateral inguinal hernias.  We started with the left side which was more symptomatic.  Using electocautery, the peritoneal and posterior rectus tissue flap was created.  The peritoneum on the left side was scored from the median umbilical ligament laterally towards the ASIS.  The flap was mobilized using robotic scissors and the bipolar instruments, creating a plane along the posterior rectus sheath and transversalis fascia down to the pubic tubercle medially. It was then further mobilized laterally across the inguinal canal and femoral vessels and onto the psoas muscle. The inferior epigastric vessels were identified and preserved. This created a posterior rectus and peritoneal flap measuring roughly 17 cm x 12 cm.  The hernia sac and contents were reduced preserving all structures.  The patient had a large cord lipoma which was resected. ? ?We then moved on to the dissection of the right side.  Using electocautery, the peritoneal and posterior rectus tissue flap was created.  The peritoneum on the right side was scored from the median umbilical ligament laterally towards the ASIS.  The flap was  mobilized using robotic scissors and the bipolar instruments, creating a plane along the posterior rectus sheath and transversalis fascia down to the pubic  tubercle medially. It was then further mobilized laterally across the inguinal canal and femoral vessels and onto the psoas muscle. The inferior epigastric vessels were identified and preserved. This created a posterior rectus and peritoneal flap measuring roughly 17 cm x 12 cm.  The hernia sac and contents were reduced preserving all structures. The patient had a small direct inguinal hernia.   ? ?We then proceeded with the mesh.  A right and left large Bard 3D Max Mid mesh was placed with good overlap along all the potential hernia defects on both sides and secured in place with 2-0 Vicryl along the medial superomedial and superolateral aspects.  Then, the peritoneal flap was advanced over the mesh on either side and carried over to close the defects. A running 2-0 V lock suture was used to approximate the edge of the flap onto the peritoneum bilaterally. ? ?All needles were removed under direct visualization.  The cord lipoma was placed in an EndoCatch bag and taken out through the 12 mm port.  The 8- mm ports were removed under direct visualization and the Hasson trocar was removed.  The umbilical hernia defect was closed using 0 vicryl suture.  Local anesthetic was infused in all incisions as well as a bilateral ilioinguinal block.  The umbilical stalk was reattached using 2-0 Vicryl and the incision was closed in layers using 3-0 Vicryl and 4-0 Monocryl.  The remaining port incisions were closed with 4-0 Monocryl.  The wounds were cleaned and sealed with DermaBond. ? ?Foley catheter was removed and the patient was emerged from anesthesia and extubated and brought to the recovery room for further management. ? ?The patient tolerated the procedure well and all counts were correct at the end of the case. ? ? ?Melvyn Neth, MD  ?

## 2021-04-16 NOTE — Anesthesia Procedure Notes (Signed)
Procedure Name: Intubation ?Date/Time: 04/16/2021 7:33 AM ?Performed by: Biagio Borg, CRNA ?Pre-anesthesia Checklist: Patient identified, Emergency Drugs available, Suction available and Patient being monitored ?Patient Re-evaluated:Patient Re-evaluated prior to induction ?Oxygen Delivery Method: Circle system utilized ?Preoxygenation: Pre-oxygenation with 100% oxygen ?Induction Type: IV induction ?Ventilation: Mask ventilation without difficulty ?Laryngoscope Size: McGraph and 4 ?Grade View: Grade II ?Tube type: Oral ?Tube size: 7.5 mm ?Number of attempts: 1 ?Airway Equipment and Method: Stylet and Oral airway ?Placement Confirmation: ETT inserted through vocal cords under direct vision, positive ETCO2 and breath sounds checked- equal and bilateral ?Secured at: 22 cm ?Tube secured with: Tape ?Dental Injury: Teeth and Oropharynx as per pre-operative assessment  ? ? ? ? ?

## 2021-04-16 NOTE — Brief Op Note (Signed)
04/16/2021 ? ?10:29 AM ? ?PATIENT:  Gerald [name]  56 y.o. male ? ?PRE-OPERATIVE DIAGNOSIS:  bilateral inguinal hernia, umbilical hernia, reducible less 3 cm ? ?POST-OPERATIVE DIAGNOSIS:  bilateral inguinal hernia, umbilical hernia, reducible less 3 cm ? ?PROCEDURE:  Procedure(s): ?XI ROBOTIC ASSISTED BILATERAL INGUINAL HERNIA (Bilateral) ?HERNIA REPAIR UMBILICAL ADULT, open  ? ?SURGEON:  Surgeon(s) and Role: ?   Olean Ree, MD - Primary ? ?ASSISTANTS: Elie Goody, PA-S  ? ?ANESTHESIA:   general ? ?EBL:  5 mL  ? ?BLOOD ADMINISTERED:none ? ?DRAINS: none  ? ?LOCAL MEDICATIONS USED:  BUPIVICAINE  ? ?SPECIMEN:  No Specimen ? ?DISPOSITION OF SPECIMEN:  N/A ? ?COUNTS:  YES ? ?DICTATION: .Dragon Dictation ? ?PLAN OF CARE: Discharge to home after PACU ? ?PATIENT DISPOSITION:  PACU - hemodynamically stable. ?  ?Delay start of Pharmacological VTE agent (>24hrs) due to surgical blood loss or risk of bleeding: yes ? ?

## 2021-04-16 NOTE — Anesthesia Preprocedure Evaluation (Addendum)
Anesthesia Evaluation  ?Patient identified by MRN, date of birth, ID band ?Patient awake ? ? ? ?Reviewed: ?Allergy & Precautions, NPO status , Patient's Chart, lab work & pertinent test results ? ?History of Anesthesia Complications ?Negative for: history of anesthetic complications ? ?Airway ?Mallampati: III ? ?TM Distance: >3 FB ?Neck ROM: Full ? ? ? Dental ?no notable dental hx. ?(+) Teeth Intact ?  ?Pulmonary ?sleep apnea and Continuous Positive Airway Pressure Ventilation , neg COPD, Patient abstained from smoking.Not current smoker,  ?  ? ?+ decreased breath sounds ? ? ? ? ? Cardiovascular ?Exercise Tolerance: Good ?METShypertension, + CAD and + Past MI  ?(-) dysrhythmias  ?Rhythm:Regular Rate:Normal ?- Systolic murmurs ?? ?TRANSTHORACIC ECHOCARDIOGRAM performed on 01/08/2018 ?1. LVEF 60-65% no regional wall motion abnormalities.   ?2. Study not technically sufficient to allow evaluation of LV diastolic function ?3. Left atrium normal in size ?4. Right ventricular systolic function normal ?5. Right ventricular systolic pressure could not be accurately estimated. ?6. No evidence of valvular regurgitation or stenosis ?7. No pericardial effusion ?? ?LEFT HEART CATHETERIZATION AND CORONARY ANGIOGRAPHY performed on 06/11/2016 ?1. LVEF 55-65% ?2. LVEDP normal ?3. No aortic valve stenosis or regurgitation ?4. No mitral valve stenosis regurgitation or prolapse evident ?5. Single-vessel CAD ?? 30% stenosis of the proximal LAD ?  ?Neuro/Psych ? Headaches, PSYCHIATRIC DISORDERS Anxiety Depression negative neurological ROS ?   ? GI/Hepatic ?GERD  ,(+)  ?  ? (-) substance abuse ? ,   ?Endo/Other  ?diabetes, Well ControlledMorbid obesity ? Renal/GU ?negative Renal ROS  ? ?  ?Musculoskeletal ? ? Abdominal ?(+) + obese,   ?Peds ? Hematology ?  ?Anesthesia Other Findings ?Past Medical History: ?No date: Abnormal LFTs ?No date: Anxiety ?No date: Atypical chest pain ?No date: Borderline  diabetes ?No date: CAD (coronary artery disease) ?    Comment:  a.) PCI and stent placement (unknown type) to mLAD and  ?             mLCx; date unknown. b.) LHC 09/10/2010: 40% mLAD; no  ?             intervention. c.) LHC 09/12/2011: EF 60%; 30% ISR mLAD,  ?             40% dLAD, 30% pLCx, 30% ISR mLCx, 40% mRCA; no  ?             intervention. d.) LHC 05/23/2014: EF >55%; 30% p-mLAD; no ?             intervention. e.) LHC 06/11/2016: EF 55-65%; LVEDP norm;  ?             30-40% p-mLAD; no intervention. ?No date: Carcinoma [facility]) ?No date: Chronic pain syndrome ?No date: Depression ?No date: Fundic gland polyps of stomach, benign ?No date: Gastritis ?No date: GERD (gastroesophageal reflux disease) ?No date: Hepatic steatosis ?No date: Hyperlipidemia ?No date: Hypertension ?No date: Migraines ?2008: Myocardial infarction [facility]) ?    Comment:  was told by PCP ?No date: Nonischemic cardiomyopathy ([place]) ?    Comment:  a.) TTE 09/12/2011: EF 35-45%. b.) LHC 09/12/2011: EF  ?             60%. c.) TTE 06/04/2012: EF 55-60%. d.) LHC 05/23/2014:  ?             EF >55%. e.) TTE 06/11/2016: EF 55-60%; G1DD. f.) LHC  ?             06/11/2016: EF 55-65%.  g.) TTE 01/08/2018: EF 60-65%. ?No date: Obesity ?No date: Occlusion and stenosis of bilateral carotid arteries ?No date: OSA on CPAP ?No date: Osteoarthritis of both knees ?No date: Shortness of breath ?02/27/2020: Squamous cell carcinoma of skin ?    Comment:  left distal lat deltoid - EDC ? Reproductive/Obstetrics ? ?  ? ? ? ? ? ? ? ? ? ? ? ? ? ?  ?  ? ? ? ? ? ? ?Anesthesia Physical ?Anesthesia Plan ? ?ASA: 3 ? ?Anesthesia Plan: General  ? ?Post-op Pain Management: Ofirmev IV (intra-op)* and Toradol IV (intra-op)*  ? ?Induction: Intravenous ? ?PONV Risk Score and Plan: 4 or greater and Ondansetron, Dexamethasone, Midazolam and Treatment may vary due to age or medical condition ? ?Airway Management Planned: Oral ETT and Video Laryngoscope Planned ? ?Additional Equipment:  None ? ?Intra-op Plan:  ? ?Post-operative Plan: Extubation in OR ? ?Informed Consent: I have reviewed the patients History and Physical, chart, labs and discussed the procedure including the risks, benefits and alternatives for the proposed anesthesia with the patient or authorized representative who has indicated his/her understanding and acceptance.  ? ? ? ?Dental advisory given ? ?Plan Discussed with: CRNA and Surgeon ? ?Anesthesia Plan Comments: (Discussed risks of anesthesia with patient, including PONV, sore throat, lip/dental/eye damage. Rare risks discussed as well, such as cardiorespiratory and neurological sequelae, and allergic reactions. Discussed the role of CRNA in patient's perioperative care. Patient understands. ?Patient informed about increased incidence of above perioperative risk due to high BMI. Patient understands. ?)  ? ? ? ? ? ? ?Anesthesia Quick Evaluation ? ?

## 2021-04-16 NOTE — Anesthesia Postprocedure Evaluation (Signed)
Anesthesia Post Note ? ?Patient: Gerald [name] ? ?Procedure(s) Performed: XI ROBOTIC ASSISTED BILATERAL INGUINAL HERNIA (Bilateral) ?HERNIA REPAIR UMBILICAL ADULT, open ?INSERTION OF MESH ? ?Patient location during evaluation: PACU ?Anesthesia Type: General ?Level of consciousness: awake and alert ?Pain management: pain level controlled ?Vital Signs Assessment: post-procedure vital signs reviewed and stable ?Respiratory status: spontaneous breathing, nonlabored ventilation, respiratory function stable and patient connected to nasal cannula oxygen ?Cardiovascular status: blood pressure returned to baseline and stable ?Postop Assessment: no apparent nausea or vomiting ?Anesthetic complications: no ? ? ?No notable events documented. ? ? ?Last Vitals:  ?Vitals:  ? 04/16/21 1154 04/16/21 1210  ?BP:  (!) 141/74  ?Pulse: 73 70  ?Resp: 14 16  ?Temp:  (!) 36.2 ?C  ?SpO2: 94% 93%  ?  ?Last Pain:  ?Vitals:  ? 04/16/21 1210  ?TempSrc: Temporal  ?PainSc: 5   ? ? ?  ?  ?  ?  ?  ?  ? ?Arita Miss ? ? ? ? ?

## 2021-04-16 NOTE — Interval H&P Note (Signed)
History and Physical Interval Note: ? ?04/16/2021 ?7:10 AM ? ?Gerald [name]  has presented today for surgery, with the diagnosis of bilateral inguinal hernia, umbilical hernia, reducible less 3 cm.  The various methods of treatment have been discussed with the patient and family. After consideration of risks, benefits and other options for treatment, the patient has consented to  Procedure(s): ?XI ROBOTIC ASSISTED BILATERAL INGUINAL HERNIA (Bilateral) ?HERNIA REPAIR UMBILICAL ADULT, open (N/A) as a surgical intervention.  The patient's history has been reviewed, patient examined, no change in status, stable for surgery.  I have reviewed the patient's chart and labs.  Questions were answered to the patient's satisfaction.   ? ? ?Chandy Tarman ? ? ?

## 2021-04-16 NOTE — Discharge Instructions (Signed)

## 2021-04-17 ENCOUNTER — Encounter: Payer: Self-pay | Admitting: Surgery

## 2021-04-18 ENCOUNTER — Ambulatory Visit: Payer: 59 | Admitting: Physician Assistant

## 2021-04-19 ENCOUNTER — Emergency Department: Payer: 59

## 2021-04-19 ENCOUNTER — Other Ambulatory Visit: Payer: Self-pay

## 2021-04-19 ENCOUNTER — Emergency Department
Admission: EM | Admit: 2021-04-19 | Discharge: 2021-04-19 | Disposition: A | Discharge status: Home / Self Care | Payer: 59 | Attending: Emergency Medicine | Admitting: Emergency Medicine

## 2021-04-19 DIAGNOSIS — E119 Type 2 diabetes mellitus without complications: Secondary | ICD-10-CM | POA: Insufficient documentation

## 2021-04-19 DIAGNOSIS — L03311 Cellulitis of abdominal wall: Secondary | ICD-10-CM

## 2021-04-19 DIAGNOSIS — I10 Essential (primary) hypertension: Secondary | ICD-10-CM | POA: Diagnosis not present

## 2021-04-19 DIAGNOSIS — G8918 Other acute postprocedural pain: Secondary | ICD-10-CM | POA: Insufficient documentation

## 2021-04-19 DIAGNOSIS — T8141XA Infection following a procedure, superficial incisional surgical site, initial encounter: Secondary | ICD-10-CM | POA: Diagnosis present

## 2021-04-19 LAB — COMPREHENSIVE METABOLIC PANEL
ALT: 32 U/L (ref 0–44)
AST: 30 U/L (ref 15–41)
Albumin: 3.7 g/dL (ref 3.5–5.0)
Alkaline Phosphatase: 83 U/L (ref 38–126)
Anion gap: 10 (ref 5–15)
BUN: 15 mg/dL (ref 6–20)
CO2: 29 mmol/L (ref 22–32)
Calcium: 8.9 mg/dL (ref 8.9–10.3)
Chloride: 100 mmol/L (ref 98–111)
Creatinine, Ser: 0.81 mg/dL (ref 0.61–1.24)
GFR, Estimated: >60 mL/min (ref >60)
Glucose, Bld: 160 mg/dL — ABNORMAL HIGH (ref 70–99)
Potassium: 3.7 mmol/L (ref 3.5–5.1)
Sodium: 139 mmol/L (ref 135–145)
Total Bilirubin: 0.6 mg/dL (ref 0.3–1.2)
Total Protein: 7.3 g/dL (ref 6.5–8.1)

## 2021-04-19 LAB — CBC WITH DIFFERENTIAL/PLATELET
Abs Immature Granulocytes: 0.13 10*3/uL — ABNORMAL HIGH (ref 0.00–0.07)
Basophils Absolute: 0.1 10*3/uL (ref 0.0–0.1)
Basophils Relative: 0 %
Eosinophils Absolute: 0.1 10*3/uL (ref 0.0–0.5)
Eosinophils Relative: 1 %
HCT: 42.1 % (ref 39.0–52.0)
Hemoglobin: 14.1 g/dL (ref 13.0–17.0)
Immature Granulocytes: 1 %
Lymphocytes Relative: 25 %
Lymphs Abs: 2.8 10*3/uL (ref 0.7–4.0)
MCH: 29 pg (ref 26.0–34.0)
MCHC: 33.5 g/dL (ref 30.0–36.0)
MCV: 86.6 fL (ref 80.0–100.0)
Monocytes Absolute: 0.7 10*3/uL (ref 0.1–1.0)
Monocytes Relative: 6 %
Neutro Abs: 7.5 10*3/uL (ref 1.7–7.7)
Neutrophils Relative %: 67 %
Platelets: 233 10*3/uL (ref 150–400)
RBC: 4.86 MIL/uL (ref 4.22–5.81)
RDW: 13.4 % (ref 11.5–15.5)
WBC: 11.3 10*3/uL — ABNORMAL HIGH (ref 4.0–10.5)
nRBC: 0 % (ref 0.0–0.2)

## 2021-04-19 LAB — LACTIC ACID, PLASMA: Lactic Acid, Venous: 1.6 mmol/L (ref 0.5–1.9)

## 2021-04-19 LAB — TROPONIN I (HIGH SENSITIVITY)
Troponin I (High Sensitivity): 5 ng/L (ref <18)
Troponin I (High Sensitivity): 6 ng/L (ref <18)

## 2021-04-19 MED ORDER — CEFAZOLIN SODIUM-DEXTROSE 2-4 GM/100ML-% IV SOLN
2.0000 g | Freq: Once | INTRAVENOUS | Status: AC
  Administered 2021-04-19: 2 g via INTRAVENOUS
  Filled 2021-04-19: qty 100

## 2021-04-19 MED ORDER — IOHEXOL 300 MG/ML  SOLN
125.0000 mL | Freq: Once | INTRAMUSCULAR | Status: AC | PRN
  Administered 2021-04-19: 125 mL via INTRAVENOUS

## 2021-04-19 MED ORDER — AMOXICILLIN-POT CLAVULANATE 875-125 MG PO TABS: 1.0000 | ORAL_TABLET | Freq: Two times a day (BID) | ORAL | 0 refills | Status: AC

## 2021-04-19 MED ORDER — ONDANSETRON HCL 4 MG/2ML IJ SOLN
4.0000 mg | Freq: Once | INTRAMUSCULAR | Status: AC
  Administered 2021-04-19: 4 mg via INTRAVENOUS
  Filled 2021-04-19: qty 2

## 2021-04-19 MED ORDER — MORPHINE SULFATE (PF) 4 MG/ML IV SOLN
4.0000 mg | Freq: Once | INTRAVENOUS | Status: AC
  Administered 2021-04-19: 4 mg via INTRAVENOUS
  Filled 2021-04-19: qty 1

## 2021-04-19 NOTE — ED Triage Notes (Signed)
Pt to ED via POV from home. Pt had 3 hernias fixed on Tuesday. Pt states spoke to the nurse on call from surgical center due to extreme lower abdominal pain, SOB, and chest tightness. Upon exam all 3 incision sites are red and painful to the touch.  ?

## 2021-04-19 NOTE — Discharge Instructions (Addendum)
Your exam and labs overall reassuring at this time.  You will be discharged with a prescription for an antibiotic which you should take twice daily as directed.  Continue to take your pain medicine as needed.  You should take OTC MiraLAX or Colace to help promote normal bowel movements while on the narcotic pain medicines.  Continue to drink fluids to help prevent dehydration and constipation.  Follow-up with Dr. Hampton Abbot next week as discussed.  Return to the ED if needed. ?

## 2021-04-19 NOTE — ED Provider Notes (Signed)
? ? ?[facility] ?Emergency Department Provider Note ? ? ? ? Event Date/Time  ? First MD Initiated Contact with Patient 04/19/21 1759   ?  (approximate) ? ? ?History  ? ?Post-op Problem ? ? ?HPI ? ?Gerald [name] is a 56 y.o. male with a history of type 2 diabetes, HTN, HLP, and non-ischemic cardiomyopathy, presents to the ED 3 days status post robot-assisted laparoscopic umbilical and bilateral inguinal hernia repair surgery.  Patient had a successful, uneventful bilateral inguinal hernia repair performed by Dr. Hampton Abbot with [facility] ago.  He was released from the hospital in no acute distress.  Patient presents today after making contact with the nurse on call from the surgical center.  Patient reported some extreme lower abdominal discomfort, some shortness of breath, some chest tightness.  He presents to the ED with his 3 laparoscopic incisions showing some local erythema and tenderness to touch.  Patient denies any interim fevers, chills, sweats, nausea, vomiting, diarrhea. ? ? ?Physical Exam  ? ?Triage Vital Signs: ?ED Triage Vitals  ?Enc Vitals Group  ?   BP 04/19/21 1611 (!) 142/75  ?   Pulse Rate 04/19/21 1611 71  ?   Resp 04/19/21 1611 (!) 24  ?   Temp 04/19/21 1611 98.2 ?F (36.8 ?C)  ?   Temp Source 04/19/21 1611 Oral  ?   SpO2 04/19/21 1611 96 %  ?   Weight --   ?   Height --   ?   Head Circumference --   ?   Peak Flow --   ?   Pain Score 04/19/21 1614 10  ?   Pain Loc --   ?   Pain Edu? --   ?   Excl. in [place]? --   ? ? ?Most recent vital signs: ?Vitals:  ? 04/19/21 1611 04/19/21 1800  ?BP: (!) 142/75 (!) 154/89  ?Pulse: 71 74  ?Resp: (!) 24 18  ?Temp: 98.2 ?F (36.8 ?C)   ?SpO2: 96% 95%  ? ? ?General Awake, no distress. afebrile ?HEENT NCAT. PERRL. EOMI. No rhinorrhea. Mucous membranes are moist.  ?CV:  Good peripheral perfusion. RRR ?RESP:  Normal effort. CTA ?ABD:  No distention. Normal bowel sounds x 4. Superficial lap ports with glue patch in place. Significant surrounding  erythema to the umbilical wound extending distally. No purulence  ? ? ?ED Results / Procedures / Treatments  ? ?Labs ?(all labs ordered are listed, but only abnormal results are displayed) ?Labs Reviewed  ?COMPREHENSIVE METABOLIC PANEL - Abnormal; Notable for the following components:  ?    Result Value  ? Glucose, Bld 160 (*)   ? All other components within normal limits  ?CBC WITH DIFFERENTIAL/PLATELET - Abnormal; Notable for the following components:  ? WBC 11.3 (*)   ? Abs Immature Granulocytes 0.13 (*)   ? All other components within normal limits  ?CULTURE, BLOOD (ROUTINE X 2)  ?CULTURE, BLOOD (ROUTINE X 2)  ?LACTIC ACID, PLASMA  ?URINALYSIS, ROUTINE W REFLEX MICROSCOPIC  ?TROPONIN I (HIGH SENSITIVITY)  ?TROPONIN I (HIGH SENSITIVITY)  ? ? ? ?EKG ? ? ?RADIOLOGY ? ?I personally viewed and evaluated these images as part of my medical decision making, as well as reviewing the written report by the radiologist. ? ?ED Provider Interpretation: reviewed images with Dr. Hampton Abbot ? ?DG Chest 2 View ? ?Result Date: 04/19/2021 ?CLINICAL DATA:  Shortness of breath EXAM: CHEST - 2 VIEW COMPARISON:  Radiograph 02/28/2021 FINDINGS: Unchanged cardiomediastinal silhouette. There is no focal  airspace consolidation. No pleural effusion. No pneumothorax. No acute osseous abnormality. Thoracic spondylosis. IMPRESSION: No evidence of acute cardiopulmonary disease. Electronically Signed   By: Maurine Simmering M.D.   On: 04/19/2021 16:54  ? ?CT ABDOMEN PELVIS W CONTRAST ? ?Result Date: 04/19/2021 ?CLINICAL DATA:  Peritonitis or perforation suspected Abdominal pain, acute, nonlocalized 3 days s/p robatic lap for bilateral inguinal repair w/ mesh. acute, nonlocalized 3 days s/p robatic lap for bilateral inguinal repair w/ mesh EXAM: CT ABDOMEN AND PELVIS WITH CONTRAST TECHNIQUE: Multidetector CT imaging of the abdomen and pelvis was performed using the standard protocol following bolus administration of intravenous contrast. RADIATION DOSE  REDUCTION: This exam was performed according to the departmental dose-optimization program which includes automated exposure control, adjustment of the mA and/or kV according to patient size and/or use of iterative reconstruction technique. CONTRAST:  128m OMNIPAQUE IOHEXOL 300 MG/ML  SOLN COMPARISON:  None. FINDINGS: Lower chest: No acute abnormality. Hepatobiliary: No focal liver abnormality. No gallstones, gallbladder wall thickening, or pericholecystic fluid. No biliary dilatation. Pancreas: No focal lesion. Normal pancreatic contour. No surrounding inflammatory changes. No main pancreatic ductal dilatation. Spleen: Normal in size without focal abnormality. Adrenals/Urinary Tract: No adrenal nodule bilaterally. Bilateral kidneys enhance symmetrically. No hydronephrosis. No hydroureter. The urinary bladder is unremarkable. On delayed imaging, there is no urothelial wall thickening and there are no filling defects in the opacified portions of the bilateral collecting systems or ureters. Stomach/Bowel: Endoluminal clip within the stomach. Stomach is within normal limits. No evidence of bowel wall thickening or dilatation. Appendix appears normal. Vascular/Lymphatic: No abdominal aorta or iliac aneurysm. Mild atherosclerotic plaque of the aorta and its branches. No abdominal, pelvic, or inguinal lymphadenopathy. Reproductive: Prostate is unremarkable. Other: Slight haziness of the small bowel mesentery with prominent but nonenlarged lymph nodes. No intraperitoneal free fluid. No intraperitoneal free gas. No organized fluid collection. Musculoskeletal: Left scrotal subcutaneus soft tissue edema and emphysema. A 2.4 cm density within the left inguinal canal (2:91). The testes are collimated off view. Trace subcutaneus soft tissue edema and emphysema along the umbilicus consistent with recent laparoscopic surgery. No suspicious lytic or blastic osseous lesions. No acute displaced fracture. Multilevel degenerative  changes of the spine. IMPRESSION: 1. Left scrotal subcutaneus soft tissue edema and emphysema likely postsurgical; however, a necrotizing fasciitis/Fournier's gangrene cannot be fully excluded. The testes are collimated off view. Recommend clinical correlation. 2. A 2.4 cm density lesion within the left inguinal canal. In the postsurgical setting this could represent a hematoma formation. No definite recurrent hernia. 3. Nonspecific small bowel misty mesentery. 4. Otherwise no acute intra-abdominal or intrapelvic abnormality. 5.  Aortic Atherosclerosis (ICD10-I70.0). Electronically Signed   By: MIven FinnM.D.   On: 04/19/2021 19:30   ? ? ?PROCEDURES: ? ?Critical Care performed: No ? ?Procedures ? ? ?MEDICATIONS ORDERED IN ED: ?Medications  ?ceFAZolin (ANCEF) IVPB 2g/100 mL premix (has no administration in time range)  ?ondansetron ([facility] injection 4 mg (4 mg Intravenous Given 04/19/21 1857)  ?morphine (PF) 4 MG/ML injection 4 mg (4 mg Intravenous Given 04/19/21 1857)  ?iohexol (OMNIPAQUE) 300 MG/ML solution 125 mL (125 mLs Intravenous Contrast Given 04/19/21 1901)  ? ? ? ?IMPRESSION / MDM / ASSESSMENT AND PLAN / ED COURSE  ?I reviewed the triage vital signs and the nursing notes. ?             ?               ? ?Differential diagnosis includes, but is not limited to,  sepsis, post-op infection, bowel perforation, intraabdominal hematoma, wound dehisence ? ?The patient is on the cardiac monitor to evaluate for evidence of arrhythmia and/or significant heart rate changes. ? ? ?----------------------------------------- ?7:14 PM on 04/19/2021 ?----------------------------------------- ?Dr. Hampton Abbot is incidentally in the ED, he has seen the patient and reviewed the CT images. If no evidence of intra-abdominal  process, he will see the patient in the office next week. He suggests oral abx and discharge to office.  ? ?Patient to the ED for evaluation of some increased lower abdominal pain 3 days status post lap or  cystic umbilical and bilateral inguinal hernia repair.  Patient presents to the ED afebrile in no acute distress.  Does endorse some significant pain.  He is evaluated for complaints in ED, found have reassuring lab wo

## 2021-04-19 NOTE — ED Notes (Signed)
Patient transported to X-ray 

## 2021-04-19 NOTE — Consult Note (Signed)
?Date of Consultation:  04/19/2021 ? ?Requesting Physician:  Lillia Mountain, PA-C ? ?Reason for Consultation:  Post-operative wound infection. ? ?History of Present Illness: ?Gerald [name] is a 56 y.o. male presenting for evaluation of wound infections.  The patient is status post robotic assisted bilateral inguinal hernia repair with open umbilical hernia repair on 04/16/2021.  The surgery was uneventful and he was noted to have a left larger than right inguinal hernia.  The patient called the office today and the answering service advised him to go to the emergency room for further evaluation.  He reports that he has been having significant and worsening lower abdominal pain which is impeding some of his breathing and also not letting him eat well.  His wife forced him to eat today.  He reports some nausea but denies any emesis.  He had a bowel movement earlier today and is passing gas as well.  Denies any fevers, chills, chest pain, shortness of breath.  He does report feeling like something is sitting on him but at his lower abdomen not on his chest. ? ?In the emergency room, his temperature was normal 98.2 with normal blood pressure and heart rate.  Lab work was overall unremarkable except for a mild elevated white blood cell count to 11.3.  He did have a CT scan of the abdomen and pelvis which showed suppurative changes in the lower abdomen and groin areas with some fluid in the left groin consistent with his larger hernia sac in that side.  There is no free air, no evidence of small bowel or colon thickening, no significant free fluid.  There is some mild inflammatory change in the mesentery of the small bowel but no associated findings with that.  On initial inspection of his abdomen however he does have marked cellulitis particularly at the umbilical incision going inferiorly. ? ?Past Medical History: ?Past Medical History:  ?Diagnosis Date  ? Abnormal LFTs   ? Anxiety   ? Atypical chest pain   ?  Borderline diabetes   ? CAD (coronary artery disease)   ? a.) PCI and stent placement (unknown type) to mLAD and mLCx; date unknown. b.) LHC 09/10/2010: 40% mLAD; no intervention. c.) LHC 09/12/2011: EF 60%; 30% ISR mLAD, 40% dLAD, 30% pLCx, 30% ISR mLCx, 40% mRCA; no intervention. d.) LHC 05/23/2014: EF >55%; 30% p-mLAD; no intervention. e.) LHC 06/11/2016: EF 55-65%; LVEDP norm; 30-40% p-mLAD; no intervention.  ? Carcinoma ([place])   ? Chronic pain syndrome   ? Depression   ? Fundic gland polyps of stomach, benign   ? Gastritis   ? GERD (gastroesophageal reflux disease)   ? Hepatic steatosis   ? Hyperlipidemia   ? Hypertension   ? Migraines   ? Myocardial infarction [facility]) 2008  ? was told by PCP  ? Nonischemic cardiomyopathy ([place])   ? a.) TTE 09/12/2011: EF 35-45%. b.) LHC 09/12/2011: EF 60%. c.) TTE 06/04/2012: EF 55-60%. d.) LHC 05/23/2014: EF >55%. e.) TTE 06/11/2016: EF 55-60%; G1DD. f.) LHC 06/11/2016: EF 55-65%. g.) TTE 01/08/2018: EF 60-65%.  ? Obesity   ? Occlusion and stenosis of bilateral carotid arteries   ? OSA on CPAP   ? Osteoarthritis of both knees   ? Shortness of breath   ? Squamous cell carcinoma of skin 02/27/2020  ? left distal lat deltoid - EDC  ?  ? ?Past Surgical History: ?Past Surgical History:  ?Procedure Laterality Date  ? BONE EXCISION Right 11/21/2020  ? Procedure: PART EXCISION BONE-  PHALANX;  Surgeon: Samara Deist, DPM;  Location: [place];  Service: Podiatry;  Laterality: Right;  ? CARDIAC CATHETERIZATION N/A 09/2011  ? [facility]; EF 50% with 30% mid LAD stenosis and no obstructive disease.  ? CARDIAC CATHETERIZATION  09/2010  ? [place]; Mid LAD 40% stenosis; Mid Circumflex:Normal; Mid RCA; Normal  ? CARDIAC CATHETERIZATION    ? COLONOSCOPY    ? COLONOSCOPY WITH PROPOFOL N/A 04/10/2021  ? Procedure: COLONOSCOPY WITH PROPOFOL;  Surgeon: Jonathon Bellows, MD;  Location[facility] ENDOSCOPY;  Service: Gastroenterology;  Laterality: N/A;  ? ESOPHAGOGASTRODUODENOSCOPY N/A 04/10/2021  ? Procedure:  ESOPHAGOGASTRODUODENOSCOPY (EGD);  Surgeon: Jonathon Bellows, MD;  Location: [facility] ENDOSCOPY;  Service: Gastroenterology;  Laterality: N/A;  ? ESOPHAGOGASTRODUODENOSCOPY (EGD) WITH PROPOFOL N/A 06/10/2017  ? Procedure: ESOPHAGOGASTRODUODENOSCOPY (EGD) WITH PROPOFOL;  Surgeon: Jonathon Bellows, MD;  Location: [facility] ENDOSCOPY;  Service: Gastroenterology;  Laterality: N/A;  ? HAMMER TOE SURGERY Right 11/21/2020  ? Procedure: HAMMERTOE CORRECTION;  Surgeon: Samara Deist, DPM;  Location: [place];  Service: Podiatry;  Laterality: Right;  ? INSERTION OF MESH  04/16/2021  ? Procedure: INSERTION OF MESH;  Surgeon: Olean Ree, MD;  Location: [facility] ORS;  Service: General;;  ? KNEE ARTHROSCOPY WITH MEDIAL MENISECTOMY Right 09/16/2012  ? Procedure: RIGHT KNEE ARTHROSCOPY WITH MEDIAL AND LATERAL MENISECTOMY, CHONDROPLASTY;  Surgeon: Ninetta Lights, MD;  Location: [place];  Service: Orthopedics;  Laterality: Right;  RIGHT KNEE SCOPE MEDIAL MENISCECTOMY  ? LEFT HEART CATH AND CORONARY ANGIOGRAPHY N/A 06/11/2016  ? Procedure: Left Heart Cath and Coronary Angiography;  Surgeon: Minna Merritts, MD;  Location: [place] CV LAB;  Service: Cardiovascular;  Laterality: N/A;  ? UMBILICAL HERNIA REPAIR N/A 04/16/2021  ? Procedure: HERNIA REPAIR UMBILICAL ADULT, open;  Surgeon: Olean Ree, MD;  Location: [facility] ORS;  Service: General;  Laterality: N/A;  ? ? ?Home Medications: ?Prior to Admission medications   ?Medication Sig Start Date End Date Taking? Authorizing Provider  ?amoxicillin-clavulanate (AUGMENTIN) 875-125 MG tablet Take 1 tablet by mouth 2 (two) times daily for 10 days. 04/20/21 04/30/21 Yes Menshew, Dannielle Karvonen, PA-C  ?acetaminophen (TYLENOL) 500 MG tablet Take 2 tablets (1,000 mg total) by mouth every 6 (six) hours as needed for mild pain. 04/16/21   Olean Ree, MD  ?buPROPion (WELLBUTRIN XL) 150 MG 24 hr tablet Take 1 tablet (150 mg total) by mouth daily. ?Patient taking differently: Take 150 mg by  mouth every evening. 01/17/21   McDonough, Si Gaul, PA-C  ?citalopram (CELEXA) 40 MG tablet TAKE 1 TABLET BY MOUTH EVERY DAY FOR GAD ?Patient taking differently: Take 40 mg by mouth every evening. TAKE 1 TABLET BY MOUTH EVERY DAY FOR GAD 01/17/21   McDonough, Si Gaul, PA-C  ?gabapentin (NEURONTIN) 100 MG capsule Take 200 mg by mouth 3 (three) times daily.    [provider]  ?ibuprofen (ADVIL) 800 MG tablet Take 1 tablet (800 mg total) by mouth every 8 (eight) hours as needed for moderate pain. 04/16/21   Olean Ree, MD  ?lisinopril-hydrochlorothiazide (ZESTORETIC) 20-25 MG tablet Take 1 tablet by mouth in the morning and at bedtime. 12/05/18   [provider]  ?Multiple Vitamins-Minerals (MULTIVITAMIN ADULTS PO) Take 1 tablet by mouth daily.    [provider]  ?Na Sulfate-K Sulfate-Mg Sulf 17.5-3.13-1.6 GM/177ML SOLN  04/05/21   [provider]  ?nitroGLYCERIN (NITROSTAT) 0.4 MG SL tablet Place 1 tablet (0.4 mg total) under the tongue every 5 (five) minutes x 3 doses as needed for chest  pain. 01/09/18   Fritzi Mandes, MD  ?oxyCODONE (OXY IR/ROXICODONE) 5 MG immediate release tablet Take 1 tablet (5 mg total) by mouth every 4 (four) hours as needed for severe pain. 04/16/21   Olean Ree, MD  ? ? ?Allergies: ?Allergies  ?Allergen Reactions  ? Statins Rash  ?   ?  ? ? ?Social History: ? reports that he has never smoked. He quit smokeless tobacco use about 36 years ago. He reports that he does not currently use alcohol. He reports that he does not use drugs.  ? ?Family History: ?Family History  ?Problem Relation Age of Onset  ? Heart disease Father 54  ?     MI  ? Heart attack Father   ? Stomach cancer Father   ? Hypertension Mother   ? Breast cancer Mother   ? ? ?Review of Systems: ?Review of Systems  ?Constitutional:  Negative for chills and fever.  ?HENT:  Negative for hearing loss.   ?Respiratory:  Negative for shortness of breath.   ?Cardiovascular:  Negative for chest pain.   ?Gastrointestinal:  Positive for abdominal pain and nausea. Negative for constipation, diarrhea and vomiting.  ?Genitourinary:  Negative for dysuria.  ?Musculoskeletal:  Negative for myalgias.  ?Skin:   ?     [place]

## 2021-04-19 NOTE — ED Notes (Signed)
1st set of blood cultures sent to lab - no BC order placed at this time  ?

## 2021-04-19 NOTE — ED Notes (Signed)
Discharge instructions, script info, and follow-up info provided to patient. Patient verbalized understanding. Patient instructed to take pain medication already provided to patient. Patient ambulated to the front lobby with a steady gait and iwthout assistance. ?

## 2021-04-19 NOTE — ED Notes (Signed)
Admitting provider at bedside.

## 2021-04-19 NOTE — ED Notes (Signed)
Patient resting on stretcher in room. RR even and unlabored. Patient states he continues to have pain but it is better after receiving pain medication. Patient verbalizes no new complaints or needs. ?

## 2021-04-19 NOTE — ED Notes (Signed)
Patient transported to CT 

## 2021-04-22 ENCOUNTER — Telehealth: Payer: Self-pay

## 2021-04-22 NOTE — Telephone Encounter (Signed)
Left message for patient to call and schedule follow up appointment with Dr.Piscoya for Today in [place] of Wednesday afternoon per Dr.Piscoya. ?

## 2021-04-24 ENCOUNTER — Ambulatory Visit (INDEPENDENT_AMBULATORY_CARE_PROVIDER_SITE_OTHER): Payer: 59 | Admitting: Surgery

## 2021-04-24 ENCOUNTER — Emergency Department: Payer: 59

## 2021-04-24 ENCOUNTER — Other Ambulatory Visit: Payer: Self-pay

## 2021-04-24 ENCOUNTER — Observation Stay
Admission: EM | Admit: 2021-04-24 | Discharge: 2021-04-25 | Disposition: A | Discharge status: Home / Self Care | Payer: 59 | Attending: Internal Medicine | Admitting: Internal Medicine

## 2021-04-24 ENCOUNTER — Encounter: Payer: Self-pay | Admitting: Surgery

## 2021-04-24 DIAGNOSIS — I2510 Atherosclerotic heart disease of native coronary artery without angina pectoris: Secondary | ICD-10-CM | POA: Insufficient documentation

## 2021-04-24 DIAGNOSIS — Z79899 Other long term (current) drug therapy: Secondary | ICD-10-CM | POA: Insufficient documentation

## 2021-04-24 DIAGNOSIS — R079 Chest pain, unspecified: Secondary | ICD-10-CM | POA: Diagnosis not present

## 2021-04-24 DIAGNOSIS — R0789 Other chest pain: Secondary | ICD-10-CM

## 2021-04-24 DIAGNOSIS — G8918 Other acute postprocedural pain: Secondary | ICD-10-CM

## 2021-04-24 DIAGNOSIS — K429 Umbilical hernia without obstruction or gangrene: Secondary | ICD-10-CM

## 2021-04-24 DIAGNOSIS — R0601 Orthopnea: Secondary | ICD-10-CM | POA: Diagnosis not present

## 2021-04-24 DIAGNOSIS — E782 Mixed hyperlipidemia: Secondary | ICD-10-CM | POA: Diagnosis present

## 2021-04-24 DIAGNOSIS — I10 Essential (primary) hypertension: Secondary | ICD-10-CM | POA: Diagnosis not present

## 2021-04-24 DIAGNOSIS — K4020 Bilateral inguinal hernia, without obstruction or gangrene, not specified as recurrent: Secondary | ICD-10-CM

## 2021-04-24 DIAGNOSIS — K654 Sclerosing mesenteritis: Secondary | ICD-10-CM | POA: Diagnosis not present

## 2021-04-24 DIAGNOSIS — Z09 Encounter for follow-up examination after completed treatment for conditions other than malignant neoplasm: Secondary | ICD-10-CM

## 2021-04-24 DIAGNOSIS — E119 Type 2 diabetes mellitus without complications: Secondary | ICD-10-CM | POA: Diagnosis not present

## 2021-04-24 DIAGNOSIS — I428 Other cardiomyopathies: Principal | ICD-10-CM | POA: Insufficient documentation

## 2021-04-24 DIAGNOSIS — Z859 Personal history of malignant neoplasm, unspecified: Secondary | ICD-10-CM | POA: Insufficient documentation

## 2021-04-24 DIAGNOSIS — F411 Generalized anxiety disorder: Secondary | ICD-10-CM | POA: Diagnosis not present

## 2021-04-24 LAB — CBC WITH DIFFERENTIAL/PLATELET
Abs Immature Granulocytes: 0.15 10*3/uL — ABNORMAL HIGH (ref 0.00–0.07)
Basophils Absolute: 0.1 10*3/uL (ref 0.0–0.1)
Basophils Relative: 1 %
Eosinophils Absolute: 0.2 10*3/uL (ref 0.0–0.5)
Eosinophils Relative: 1 %
HCT: 42.9 % (ref 39.0–52.0)
Hemoglobin: 14.3 g/dL (ref 13.0–17.0)
Immature Granulocytes: 1 %
Lymphocytes Relative: 18 %
Lymphs Abs: 2.4 10*3/uL (ref 0.7–4.0)
MCH: 29.1 pg (ref 26.0–34.0)
MCHC: 33.3 g/dL (ref 30.0–36.0)
MCV: 87.4 fL (ref 80.0–100.0)
Monocytes Absolute: 0.9 10*3/uL (ref 0.1–1.0)
Monocytes Relative: 7 %
Neutro Abs: 9.2 10*3/uL — ABNORMAL HIGH (ref 1.7–7.7)
Neutrophils Relative %: 72 %
Platelets: 233 10*3/uL (ref 150–400)
RBC: 4.91 MIL/uL (ref 4.22–5.81)
RDW: 13.6 % (ref 11.5–15.5)
WBC: 12.9 10*3/uL — ABNORMAL HIGH (ref 4.0–10.5)
nRBC: 0 % (ref 0.0–0.2)

## 2021-04-24 LAB — CULTURE, BLOOD (ROUTINE X 2)
Culture: NO GROWTH
Culture: NO GROWTH
Special Requests: ADEQUATE
Special Requests: ADEQUATE

## 2021-04-24 LAB — COMPREHENSIVE METABOLIC PANEL
ALT: 29 U/L (ref 0–44)
AST: 26 U/L (ref 15–41)
Albumin: 3.8 g/dL (ref 3.5–5.0)
Alkaline Phosphatase: 78 U/L (ref 38–126)
Anion gap: 10 (ref 5–15)
BUN: 15 mg/dL (ref 6–20)
CO2: 24 mmol/L (ref 22–32)
Calcium: 8.8 mg/dL — ABNORMAL LOW (ref 8.9–10.3)
Chloride: 100 mmol/L (ref 98–111)
Creatinine, Ser: 0.94 mg/dL (ref 0.61–1.24)
GFR, Estimated: >60 mL/min (ref >60)
Glucose, Bld: 155 mg/dL — ABNORMAL HIGH (ref 70–99)
Potassium: 4 mmol/L (ref 3.5–5.1)
Sodium: 134 mmol/L — ABNORMAL LOW (ref 135–145)
Total Bilirubin: 1 mg/dL (ref 0.3–1.2)
Total Protein: 7.3 g/dL (ref 6.5–8.1)

## 2021-04-24 LAB — TROPONIN I (HIGH SENSITIVITY)
Troponin I (High Sensitivity): 4 ng/L (ref <18)
Troponin I (High Sensitivity): 4 ng/L (ref <18)

## 2021-04-24 LAB — BRAIN NATRIURETIC PEPTIDE: B Natriuretic Peptide: 27.9 pg/mL (ref 0.0–100.0)

## 2021-04-24 MED ORDER — IOHEXOL 350 MG/ML SOLN
100.0000 mL | Freq: Once | INTRAVENOUS | Status: AC | PRN
  Administered 2021-04-24: 100 mL via INTRAVENOUS

## 2021-04-24 MED ORDER — ENOXAPARIN SODIUM 80 MG/0.8ML IJ SOSY
70.0000 mg | PREFILLED_SYRINGE | INTRAMUSCULAR | Status: DC
  Administered 2021-04-24: 70 mg via SUBCUTANEOUS
  Filled 2021-04-24: qty 0.7
  Filled 2021-04-24: qty 0.8

## 2021-04-24 MED ORDER — CITALOPRAM HYDROBROMIDE 20 MG PO TABS
40.0000 mg | ORAL_TABLET | Freq: Every evening | ORAL | Status: DC
Start: 2021-04-24 — End: 2021-04-25
  Administered 2021-04-24: 40 mg via ORAL
  Filled 2021-04-24: qty 2

## 2021-04-24 MED ORDER — FENTANYL CITRATE PF 50 MCG/ML IJ SOSY
50.0000 ug | PREFILLED_SYRINGE | Freq: Once | INTRAMUSCULAR | Status: AC
  Administered 2021-04-24: 50 ug via INTRAVENOUS
  Filled 2021-04-24: qty 1

## 2021-04-24 MED ORDER — HYDROCODONE-ACETAMINOPHEN 5-325 MG PO TABS
1.0000 | ORAL_TABLET | ORAL | Status: DC | PRN
  Administered 2021-04-24 – 2021-04-25 (×3): 2 via ORAL
  Filled 2021-04-24 (×3): qty 2

## 2021-04-24 MED ORDER — SENNA 8.6 MG PO TABS
1.0000 | ORAL_TABLET | Freq: Two times a day (BID) | ORAL | Status: DC
  Administered 2021-04-24 – 2021-04-25 (×2): 8.6 mg via ORAL
  Filled 2021-04-24 (×2): qty 1

## 2021-04-24 MED ORDER — SODIUM CHLORIDE 0.9 % IV SOLN: 250.0000 mL | INTRAVENOUS | Status: DC | PRN

## 2021-04-24 MED ORDER — FENTANYL CITRATE PF 50 MCG/ML IJ SOSY
12.5000 ug | PREFILLED_SYRINGE | INTRAMUSCULAR | Status: DC | PRN
  Administered 2021-04-24: 25 ug via INTRAVENOUS
  Administered 2021-04-24 – 2021-04-25 (×2): 50 ug via INTRAVENOUS
  Administered 2021-04-25: 25 ug via INTRAVENOUS
  Filled 2021-04-24 (×4): qty 1

## 2021-04-24 MED ORDER — GABAPENTIN 100 MG PO CAPS
200.0000 mg | ORAL_CAPSULE | Freq: Three times a day (TID) | ORAL | Status: DC
  Administered 2021-04-24 – 2021-04-25 (×3): 200 mg via ORAL
  Filled 2021-04-24 (×3): qty 2

## 2021-04-24 MED ORDER — LISINOPRIL-HYDROCHLOROTHIAZIDE 20-25 MG PO TABS: 1.0000 | ORAL_TABLET | Freq: Every day | ORAL | Status: DC

## 2021-04-24 MED ORDER — BISACODYL 5 MG PO TBEC: 5.0000 mg | DELAYED_RELEASE_TABLET | Freq: Every day | ORAL | Status: DC | PRN

## 2021-04-24 MED ORDER — MORPHINE SULFATE (PF) 4 MG/ML IV SOLN
4.0000 mg | Freq: Once | INTRAVENOUS | Status: AC
  Administered 2021-04-24: 4 mg via INTRAVENOUS
  Filled 2021-04-24: qty 1

## 2021-04-24 MED ORDER — LISINOPRIL 20 MG PO TABS
20.0000 mg | ORAL_TABLET | Freq: Every day | ORAL | Status: DC
  Administered 2021-04-25: 20 mg via ORAL
  Filled 2021-04-24: qty 1

## 2021-04-24 MED ORDER — SODIUM CHLORIDE 0.9% FLUSH
3.0000 mL | Freq: Two times a day (BID) | INTRAVENOUS | Status: DC
  Administered 2021-04-24 – 2021-04-25 (×2): 3 mL via INTRAVENOUS

## 2021-04-24 MED ORDER — HYDROCHLOROTHIAZIDE 25 MG PO TABS
25.0000 mg | ORAL_TABLET | Freq: Every day | ORAL | Status: DC
  Administered 2021-04-25: 25 mg via ORAL
  Filled 2021-04-24: qty 1

## 2021-04-24 MED ORDER — FUROSEMIDE 10 MG/ML IJ SOLN
40.0000 mg | Freq: Two times a day (BID) | INTRAMUSCULAR | Status: DC
  Administered 2021-04-24 – 2021-04-25 (×2): 40 mg via INTRAVENOUS
  Filled 2021-04-24 (×2): qty 4

## 2021-04-24 MED ORDER — BUPROPION HCL ER (XL) 150 MG PO TB24
150.0000 mg | ORAL_TABLET | Freq: Every evening | ORAL | Status: DC
  Administered 2021-04-24: 150 mg via ORAL
  Filled 2021-04-24: qty 1

## 2021-04-24 MED ORDER — SODIUM CHLORIDE 0.9% FLUSH: 3.0000 mL | INTRAVENOUS | Status: DC | PRN

## 2021-04-24 MED ORDER — AMOXICILLIN-POT CLAVULANATE 875-125 MG PO TABS
1.0000 | ORAL_TABLET | Freq: Two times a day (BID) | ORAL | Status: DC
  Administered 2021-04-24 – 2021-04-25 (×2): 1 via ORAL
  Filled 2021-04-24 (×2): qty 1

## 2021-04-24 MED ORDER — KETOROLAC TROMETHAMINE 30 MG/ML IJ SOLN
15.0000 mg | Freq: Once | INTRAMUSCULAR | Status: AC
Start: 2021-04-24 — End: 2021-04-24
  Administered 2021-04-24: 15 mg via INTRAVENOUS
  Filled 2021-04-24: qty 1

## 2021-04-24 NOTE — Assessment & Plan Note (Addendum)
Patient has a lower risk for cardiac condition.  Ready by cardiology before discharge.  PE ruled out.  Patient will need to follow-up with PCP and cardiology as outpatient ?

## 2021-04-24 NOTE — Assessment & Plan Note (Addendum)
Resume home regimen. 

## 2021-04-24 NOTE — ED Provider Notes (Signed)
? ?[facility] ?Provider Note ? ? ? Event Date/Time  ? First MD Initiated Contact with Patient 04/24/21 1042   ?  (approximate) ? ? ?History  ? ?Chest Pain (Chest pain and sob  post inguinal hernia surgery last Tuesday, pt on po keflex for post surgical infection x 1 week of meds, surgeon called for ems . Pt axox4 ) ? ? ?HPI ? ?Gerald [name] is a 56 y.o. male who presents to the ED for evaluation of Chest Pain (Chest pain and sob  post inguinal hernia surgery last Tuesday, pt on po keflex for post surgical infection x 1 week of meds, surgeon called for ems . Pt axox4 ) ?  ?Robotic umbilical hernia repair on 3/14. Followed up on 3/17 in the ED due to post op wound infection.  CTA performed with suppurative changes to the lower abdomen and groin.  Cellulitis at the surgical site.  He got Ancef in the ED and discharged with Augmentin, because he declined admission. ?Otherwise history of morbid obesity, CAD s/p stenting, HTN, HLD, GERD and nonischemic cardiomyopathy. ? ?Patient presents to the ED for evaluation of orthopnea and positional chest pain.  He reports compliance with his antibiotics and denies any fevers or increasing abdominal pain.  Reports left-sided abdominal pain when supine, improved with sitting upright.  Also reports sensation of shortness of breath while recumbent.  Reports slight increase of his lower extremity edema.  Denies syncopal episodes, falls or trauma. ? ? ?Physical Exam  ? ?Triage Vital Signs: ?ED Triage Vitals  ?Enc Vitals Group  ?   BP   ?   Pulse   ?   Resp   ?   Temp   ?   Temp src   ?   SpO2   ?   Weight   ?   Height   ?   Head Circumference   ?   Peak Flow   ?   Pain Score   ?   Pain Loc   ?   Pain Edu?   ?   Excl. in [place]?   ? ? ?Most recent vital signs: ?Vitals:  ? 04/24/21 1047 04/24/21 1400  ?BP: 130/68 138/78  ?Pulse: 80 66  ?Resp: 17 17  ?Temp: 99.7 ?F (37.6 ?C) 98.5 ?F (36.9 ?C)  ?SpO2: 98% 93%  ? ? ?General: Awake, no distress.  Morbidly obese.   Appears uncomfortable. ?CV:  Good peripheral perfusion.  ?Resp:  Normal effort.  ?Abd:  No distention.  Mild LLQ and left inguinal tenderness without peritoneal features or guarding.  Otherwise benign abdomen ?MSK:  No deformity noted.  No skin changes or rash at the site of pain to left-sided chest.  Somewhat tender to palpation. ?Neuro:  No focal deficits appreciated. ?Other:   ? ? ?ED Results / Procedures / Treatments  ? ?Labs ?(all labs ordered are listed, but only abnormal results are displayed) ?Labs Reviewed  ?COMPREHENSIVE METABOLIC PANEL - Abnormal; Notable for the following components:  ?    Result Value  ? Sodium 134 (*)   ? Glucose, Bld 155 (*)   ? Calcium 8.8 (*)   ? All other components within normal limits  ?CBC WITH DIFFERENTIAL/PLATELET - Abnormal; Notable for the following components:  ? WBC 12.9 (*)   ? Neutro Abs 9.2 (*)   ? Abs Immature Granulocytes 0.15 (*)   ? All other components within normal limits  ?BRAIN NATRIURETIC PEPTIDE  ?TROPONIN I (HIGH SENSITIVITY)  ?TROPONIN  I (HIGH SENSITIVITY)  ? ? ?EKG ?Sinus rhythm, rate of 84 bpm.  Normal axis and intervals.  1 PVC.  Nonspecific ST changes to leads III and aVF without STEMI. ? ?RADIOLOGY ?CXR reviewed by me without evidence of acute cardiopulmonary pathology. ?CTA chest reviewed by me without evidence of PE ?CT abdomen/pelvis reviewed by me without evidence of progression of any intra-abdominal abscess ? ?Official radiology report(s): ?CT Angio Chest PE W and/or Wo Contrast ? ?Result Date: 04/24/2021 ?CLINICAL DATA:  post op hernia, sudden SOB and left sided chest pain. eval PE; post op hernia repair. Started on ABX last week for infection. Worsening LLQ/left inguinal pain. eval progression EXAM: CT ANGIOGRAPHY CHEST CT ABDOMEN AND PELVIS WITH CONTRAST TECHNIQUE: Multidetector CT imaging of the chest was performed using the standard protocol during bolus administration of intravenous contrast. Multiplanar CT image reconstructions and MIPs were  obtained to evaluate the vascular anatomy. Multidetector CT imaging of the abdomen and pelvis was performed using the standard protocol during bolus administration of intravenous contrast. RADIATION DOSE REDUCTION: This exam was performed according to the departmental dose-optimization program which includes automated exposure control, adjustment of the mA and/or kV according to patient size and/or use of iterative reconstruction technique. CONTRAST:  13m OMNIPAQUE IOHEXOL 350 MG/ML SOLN COMPARISON:  07/04/2018, 04/19/2021 FINDINGS: CTA CHEST FINDINGS Cardiovascular: Satisfactory opacification of the pulmonary arteries to the segmental level. No evidence of pulmonary embolism. Thoracic aorta is nonaneurysmal. Normal heart size. No pericardial effusion. Mediastinum/Nodes: No enlarged mediastinal, hilar, or axillary lymph nodes. Thyroid gland, trachea, and esophagus demonstrate no significant findings. Lungs/Pleura: Lungs are clear. No pleural effusion or pneumothorax. Musculoskeletal: Bilateral gynecomastia. No acute osseous abnormality. Review of the MIP images confirms the above findings. CT ABDOMEN and PELVIS FINDINGS Hepatobiliary: No focal liver abnormality is seen. No gallstones, gallbladder wall thickening, or biliary dilatation. Pancreas: Unremarkable. No pancreatic ductal dilatation or surrounding inflammatory changes. Spleen: Normal in size without focal abnormality. Adrenals/Urinary Tract: Unremarkable adrenal glands. Kidneys enhance symmetrically without focal lesion, stone, or hydronephrosis. Ureters are nondilated. Urinary bladder appears unremarkable. Stomach/Bowel: Endoluminal clips again noted within the stomach. Stomach is otherwise within normal limits. Appendix appears normal. No evidence of bowel wall thickening, distention, or inflammatory changes. Vascular/Lymphatic: No significant vascular findings are present. No enlarged abdominal or pelvic lymph nodes. Redemonstrated misty mesentery with  multiple small central mesenteric lymph nodes, unchanged. Reproductive: Prostate is unremarkable. Other: Small amount of fluid is again seen within the left inguinal canal. Previously seen air within the left inguinal soft tissues has resolved. Minimal residual subcutaneous air at the umbilicus related to recent surgery, improved. No recurrent umbilical hernia. No ascites. No pneumoperitoneum. No organized intra-or pelvic fluid collections. Musculoskeletal: No acute osseous abnormality. Review of the MIP images confirms the above findings. IMPRESSION: 1. No evidence of pulmonary embolism or other acute cardiopulmonary process. 2. No acute abdominopelvic findings. 3. Interval resolution of previously seen air within the left inguinal soft tissues. Small amount of fluid within the left inguinal canal appears unchanged. 4. Redemonstrated misty mesentery with multiple small central mesenteric lymph nodes, which can be seen in the setting of mesenteric panniculitis. Electronically Signed   By: NDavina PokeD.O.   On: 04/24/2021 12:41  ? ?CT ABDOMEN PELVIS W CONTRAST ? ?Result Date: 04/24/2021 ?CLINICAL DATA:  post op hernia, sudden SOB and left sided chest pain. eval PE; post op hernia repair. Started on ABX last week for infection. Worsening LLQ/left inguinal pain. eval progression EXAM: CT ANGIOGRAPHY CHEST CT ABDOMEN  AND PELVIS WITH CONTRAST TECHNIQUE: Multidetector CT imaging of the chest was performed using the standard protocol during bolus administration of intravenous contrast. Multiplanar CT image reconstructions and MIPs were obtained to evaluate the vascular anatomy. Multidetector CT imaging of the abdomen and pelvis was performed using the standard protocol during bolus administration of intravenous contrast. RADIATION DOSE REDUCTION: This exam was performed according to the departmental dose-optimization program which includes automated exposure control, adjustment of the mA and/or kV according to patient  size and/or use of iterative reconstruction technique. CONTRAST:  171m OMNIPAQUE IOHEXOL 350 MG/ML SOLN COMPARISON:  07/04/2018, 04/19/2021 FINDINGS: CTA CHEST FINDINGS Cardiovascular: Satisfactory opaci

## 2021-04-24 NOTE — Assessment & Plan Note (Addendum)
I examined patient, condition seem to have improved ?

## 2021-04-24 NOTE — Progress Notes (Signed)
PHARMACIST - PHYSICIAN COMMUNICATION ? ?CONCERNING:  Enoxaparin (Lovenox) for DVT Prophylaxis  ? ? ?RECOMMENDATION: ?Patient was prescribed enoxaprin '40mg'$  q24 hours for VTE prophylaxis.  ? ?Filed Weights  ? 04/24/21 1046  ?Weight: (!) 145 kg (319 lb 10.7 oz)  ? ? ?Body mass index is 41.04 kg/m?. ? ?Estimated Creatinine Clearance: 136.3 mL/min (by C-G formula based on SCr of 0.94 mg/dL). ? ? ?Based on [place] patient is candidate for enoxaparin 0.'5mg'$ /kg TBW SQ every 24 hours based on BMI being >30. ? ? ?DESCRIPTION: ?Pharmacy has adjusted enoxaparin dose per [facility] policy. ? ?Patient is now receiving enoxaparin 70 mg every 24 hours  ? ? ?Wynelle Cleveland, PharmD ?Pharmacy Resident  ?04/24/2021 ?3:59 PM ? ? ?

## 2021-04-24 NOTE — Assessment & Plan Note (Addendum)
Condition stable  

## 2021-04-24 NOTE — Assessment & Plan Note (Addendum)
Patient has postop wound infection, which essentially has resolved. ?

## 2021-04-24 NOTE — H&P (Addendum)
? ? ?HISTORY AND PHYSICAL ? ?Patient: Gerald [name] 56 y.o. male ?MRN: [id number] ? ?Today is hospital day 0 after admission on 04/24/2021 10:41 AM ? ?RECORD REVIEW AND HOSPITAL COURSE: ?Chest pain (positional) and orthopnea worsening past 2 days, he is POD8 s/p robotic assisted bilateral inguinal hernia repair with open umbilical hernia repair on 04/16/2021, and had visit to ED on 04/19/21 for abd pain and was placed on po antibiotics Augmentin for cellulitis involving surgical incision site (no intraabdomnial process identified on ED eval 04/19/2021) after receiving Ancef x1 daose in ED and was offered admission at that time but declined in favor of close outpatient f/u.  ?Patient presents to the ED today 04/24/21 for evaluation of orthopnea and positional chest pain. He has history of morbid obesity, CAD s/p stenting, HTN, HLD, GERD and nonischemic cardiomyopathy. He reports compliance with his antibiotics and denies any fevers or increasing abdominal pain.  Reports left-sided abdominal pain when supine, improved with sitting upright.  Also reports sensation of shortness of breath while recumbent.  Reports slight increase of his lower extremity edema.  Denies syncopal episodes, falls or trauma. ?In ED, no significant concerning lab findings other than slight elevation WBC to 12.9, troponins were negative x2, BNP WNL but pt states he used to eb on diuretics and has been off these for some time and reports increased LE edema. CXR, CTA chest no PE, CT abd/pelvis no intraabdominal process. Pt treated w/ morphine, toradol, fentanyl, aspirin. Given cardiac history and patient continued pain and anxiety, ED consulted hospitalist for observation of patient overnight  ? ?Procedures and Significant Results:  ?CTA chest 04/24/21 neg for PE ?CT Abd/Pelvis 04/24/21: "No acute abdominopelvic findings. 3. Interval resolution of previously seen air within the left inguinal soft tissues. Small amount of fluid within the left  inguinal canal appears unchanged. 4. Redemonstrated misty mesentery with multiple small central mesenteric lymph nodes, which can be seen in the setting of mesenteric panniculitis" ? ?Consultants:  ?None at this time ? ? ? ?SUBJECTIVE:  ?Patient seen and examined at bedside in ED, resting comfortably but he does appear anxious.  Patient's wife is present at bedside.  Confirms above history, states he is very worried about his inability to get a deep breath while lying down flat, states that the central chest pain is persisting though noted that it is improved it is not resolved and he is nervous about this.  Reports compliance with antibiotics as noted above, reports he used to be on fluid pills but is not any longer, he states that he has noticed some increased swelling in lower extremities.  Postoperative constipation is slowly improving but he is stating that he is still having to take laxatives at home ? ? ? ? ?ASSESSMENT & PLAN ? ?Chest pain ?No concern at this point for ACS given EKG and troponin x2 WNL, CTA negative for PE, CT abdomen also no obvious cause for pain/orthopnea. Mild fluid overload and hx nonischemic cardiomyopathy - will obtain Echo and observe overnight. Postoperative pain certainly and also suspect components of GAD and obesity may contribute to symptoms ? ?Nonischemic cardiomyopathy ([place]) ?Continue telemetry  ?Echo pending  ? ?Mixed hyperlipidemia ?intoelrant to statins ?Given risk factors may d/w PCP/cardiologist outpatient if PCSK9 may be warranted ? ?HTN (hypertension) ?Controlled, continue home medications ? ?Postoperative abdominal pain ?Continue po abx ?No intraabdominal abscess or other cause for pain noted, may be adhesions/healing process, would consult surgery if worse/change ? ?Mesenteric panniculitis ([place]) ?Benign finding on CT  today, may contribute to abdominal pain ? ? ? ?VTE Ppx: lovenox ?CODE STATUS   Code Status: Full Code ?Admitted from: home ?Expected Dispo: home ?Barriers  to discharge: continued medical workup/observation ?Family communication: wife at bedside  ? ? ? ? ? ? ? ? ? ? ? ? ? ?Past Medical History:  ?Diagnosis Date  ? Abnormal LFTs   ? Anxiety   ? Atypical chest pain   ? Borderline diabetes   ? CAD (coronary artery disease)   ? a.) PCI and stent placement (unknown type) to mLAD and mLCx; date unknown. b.) LHC 09/10/2010: 40% mLAD; no intervention. c.) LHC 09/12/2011: EF 60%; 30% ISR mLAD, 40% dLAD, 30% pLCx, 30% ISR mLCx, 40% mRCA; no intervention. d.) LHC 05/23/2014: EF >55%; 30% p-mLAD; no intervention. e.) LHC 06/11/2016: EF 55-65%; LVEDP norm; 30-40% p-mLAD; no intervention.  ? Carcinoma ([place])   ? Chronic pain syndrome   ? Depression   ? Fundic gland polyps of stomach, benign   ? Gastritis   ? GERD (gastroesophageal reflux disease)   ? Hepatic steatosis   ? Hyperlipidemia   ? Hypertension   ? Migraines   ? Myocardial infarction [facility]) 2008  ? was told by PCP  ? Nonischemic cardiomyopathy ([place])   ? a.) TTE 09/12/2011: EF 35-45%. b.) LHC 09/12/2011: EF 60%. c.) TTE 06/04/2012: EF 55-60%. d.) LHC 05/23/2014: EF >55%. e.) TTE 06/11/2016: EF 55-60%; G1DD. f.) LHC 06/11/2016: EF 55-65%. g.) TTE 01/08/2018: EF 60-65%.  ? Obesity   ? Occlusion and stenosis of bilateral carotid arteries   ? OSA on CPAP   ? Osteoarthritis of both knees   ? Shortness of breath   ? Squamous cell carcinoma of skin 02/27/2020  ? left distal lat deltoid - [facility]  ? ? ?Past Surgical History:  ?Procedure Laterality Date  ? BONE EXCISION Right 11/21/2020  ? Procedure: PART EXCISION BONE- PHALANX;  Surgeon: Samara Deist, DPM;  Location: [place];  Service: Podiatry;  Laterality: Right;  ? CARDIAC CATHETERIZATION N/A 09/2011  ? [facility]; EF 50% with 30% mid LAD stenosis and no obstructive disease.  ? CARDIAC CATHETERIZATION  09/2010  ? [place]; Mid LAD 40% stenosis; Mid Circumflex:Normal; Mid RCA; Normal  ? CARDIAC CATHETERIZATION    ? COLONOSCOPY    ? COLONOSCOPY WITH PROPOFOL N/A 04/10/2021  ? Procedure:  COLONOSCOPY WITH PROPOFOL;  Surgeon: Jonathon Bellows, MD;  [facility] ENDOSCOPY;  Service: Gastroenterology;  Laterality: N/A;  ? ESOPHAGOGASTRODUODENOSCOPY N/A 04/10/2021  ? Procedure: ESOPHAGOGASTRODUODENOSCOPY (EGD);  Surgeon: Jonathon Bellows, MD;  Location[facility] ENDOSCOPY;  Service: Gastroenterology;  Laterality: N/A;  ? ESOPHAGOGASTRODUODENOSCOPY (EGD) WITH PROPOFOL N/A 06/10/2017  ? Procedure: ESOPHAGOGASTRODUODENOSCOPY (EGD) WITH PROPOFOL;  Surgeon: Jonathon Bellows, MD;  Location: [facility] ENDOSCOPY;  Service: Gastroenterology;  Laterality: N/A;  ? HAMMER TOE SURGERY Right 11/21/2020  ? Procedure: HAMMERTOE CORRECTION;  Surgeon: Samara Deist, DPM;  Location: [place];  Service: Podiatry;  Laterality: Right;  ? INSERTION OF MESH  04/16/2021  ? Procedure: INSERTION OF MESH;  Surgeon: Olean Ree, MD;  Location: [facility] ORS;  Service: General;;  ? KNEE ARTHROSCOPY WITH MEDIAL MENISECTOMY Right 09/16/2012  ? Procedure: RIGHT KNEE ARTHROSCOPY WITH MEDIAL AND LATERAL MENISECTOMY, CHONDROPLASTY;  Surgeon: Ninetta Lights, MD;  Location: [place];  Service: Orthopedics;  Laterality: Right;  RIGHT KNEE SCOPE MEDIAL MENISCECTOMY  ? LEFT HEART CATH AND CORONARY ANGIOGRAPHY N/A 06/11/2016  ? Procedure: Left Heart Cath and Coronary Angiography;  Surgeon: Minna Merritts, MD;  Location:  [place] CV LAB;  Service: Cardiovascular;  Laterality: N/A;  ? UMBILICAL HERNIA REPAIR N/A 04/16/2021  ? Procedure: HERNIA REPAIR UMBILICAL ADULT, open;  Surgeon: Olean Ree, MD;  Location: [facility] ORS;  Service: General;  Laterality: N/A;  ? ? ?Family History  ?Problem Relation Age of Onset  ? Heart disease Father 88  ?     MI  ? Heart attack Father   ? Stomach cancer Father   ? Hypertension Mother   ? Breast cancer Mother   ? ?Social History:  reports that he has never smoked. He quit smokeless tobacco use about 36 years ago. He reports that he does not currently use alcohol. He reports that he does not use  drugs. ? ?Allergies:  ?Allergies  ?Allergen Reactions  ? Statins Rash  ?   ?  ? ? ?(Not in a hospital admission) ? ?No current facility-administered medications on file prior to encounter.  ? ?Current Outpatient Medi

## 2021-04-24 NOTE — ED Triage Notes (Signed)
Pt to ED via POV from home. Pt had 3 hernias fixed on Tuesday. Pt states spoke to the nurse on call from surgical center due to extreme lower abdominal pain, SOB, and chest tightness. Upon exam all 3 incision sites are red and painful to the touch.  ?

## 2021-04-24 NOTE — Progress Notes (Signed)
04/24/2021 ? ?HPI: ?Gerald [name] is a 56 y.o. male s/p robotic assisted bilateral inguinal hernia repair with open umbilical hernia repair on 04/16/21.  He presented to the ED on 04/19/21 due to redness/tenderness around the incisions.  He was diagnosed with wound infection and was treated as outpatient with Augmentin.  Today, he reports that he had been taking his antibiotics as instructed and his wounds have significantly improved.  But he woke up this morning with chest pain and pressure, with some shortness of breath.  In clinic, he is in clear distress.  He was hypertensive, but not tachycardic and with normal O2 saturation. ? ?Vital signs: ?There were no vitals taken for this visit.  ? ?Physical Exam: ?Constitutional:  Appears uncomfortable. ?Abdomen:  soft, obese, non-distended, with appropriate tenderness to palpation.  Incisions are clean, dry, intact, now without any significant erythema or induration. ? ?Assessment/Plan: ?This is a 56 y.o. male s/p robotic assisted bilateral inguinal hernia repair and open umbilical hernia repair. ? ?--Patient woke up this morning with chest pain and pressure, with associated shortness of breath.  He does have a cardiac history with prior MI in the past and has hx of prior cardiac catheterizations.  EMS was called and will be taking him [facility] ER for further evaluation.  EKG done by EMS does not appear to have STEMI, but further workup is needed.  Patient understands and is in agreement to go to ER with EMS.   ?--From surgical standpoint, incisions have improved.  No evidence of infection or hernia recurrence.  Will set up follow up in 2 weeks for a more formal post-op check. ? ? ?Melvyn Neth, MD ? [facility]  ?

## 2021-04-24 NOTE — ED Triage Notes (Signed)
Asa given by ems , nitro sl x 2 doses administered , pt allergic to statins  ?

## 2021-04-25 ENCOUNTER — Observation Stay (HOSPITAL_BASED_OUTPATIENT_CLINIC_OR_DEPARTMENT_OTHER)
Admit: 2021-04-25 | Discharge: 2021-04-25 | Disposition: A | Discharge status: Home / Self Care | Payer: 59 | Attending: Osteopathic Medicine | Admitting: Osteopathic Medicine

## 2021-04-25 ENCOUNTER — Telehealth: Payer: Self-pay

## 2021-04-25 DIAGNOSIS — R0789 Other chest pain: Secondary | ICD-10-CM | POA: Diagnosis not present

## 2021-04-25 DIAGNOSIS — I10 Essential (primary) hypertension: Secondary | ICD-10-CM | POA: Diagnosis not present

## 2021-04-25 DIAGNOSIS — R0609 Other forms of dyspnea: Secondary | ICD-10-CM | POA: Diagnosis not present

## 2021-04-25 DIAGNOSIS — I428 Other cardiomyopathies: Secondary | ICD-10-CM | POA: Diagnosis not present

## 2021-04-25 LAB — BASIC METABOLIC PANEL
Anion gap: 9 (ref 5–15)
BUN: 20 mg/dL (ref 6–20)
CO2: 27 mmol/L (ref 22–32)
Calcium: 8.8 mg/dL — ABNORMAL LOW (ref 8.9–10.3)
Chloride: 99 mmol/L (ref 98–111)
Creatinine, Ser: 1.18 mg/dL (ref 0.61–1.24)
GFR, Estimated: >60 mL/min (ref >60)
Glucose, Bld: 195 mg/dL — ABNORMAL HIGH (ref 70–99)
Potassium: 3.6 mmol/L (ref 3.5–5.1)
Sodium: 135 mmol/L (ref 135–145)

## 2021-04-25 LAB — CBC
HCT: 40.2 % (ref 39.0–52.0)
Hemoglobin: 13.5 g/dL (ref 13.0–17.0)
MCH: 29.3 pg (ref 26.0–34.0)
MCHC: 33.6 g/dL (ref 30.0–36.0)
MCV: 87.4 fL (ref 80.0–100.0)
Platelets: 242 10*3/uL (ref 150–400)
RBC: 4.6 MIL/uL (ref 4.22–5.81)
RDW: 13.8 % (ref 11.5–15.5)
WBC: 10 10*3/uL (ref 4.0–10.5)
nRBC: 0 % (ref 0.0–0.2)

## 2021-04-25 LAB — ECHOCARDIOGRAM COMPLETE
AR max vel: 2.18 cm2
AV Area VTI: 2.15 cm2
AV Area mean vel: 2.09 cm2
AV Mean grad: 4 mmHg
AV Peak grad: 7.1 mmHg
Ao pk vel: 1.33 m/s
Area-P 1/2: 2.84 cm2
Height: 74 in
MV VTI: 2.04 cm2
S' Lateral: 3.46 cm
Weight: 5043.2 oz

## 2021-04-25 MED ORDER — PERFLUTREN LIPID MICROSPHERE
1.0000 mL | INTRAVENOUS | Status: AC | PRN
  Administered 2021-04-25: 4 mL via INTRAVENOUS
  Filled 2021-04-25: qty 10

## 2021-04-25 MED ORDER — NITROGLYCERIN 0.4 MG SL SUBL
0.4000 mg | SUBLINGUAL_TABLET | SUBLINGUAL | 0 refills | Status: AC | PRN
Start: 2021-04-25 — End: ?

## 2021-04-25 NOTE — Assessment & Plan Note (Signed)
Resume home treatment. ?

## 2021-04-25 NOTE — Telephone Encounter (Signed)
Per Dr.Piscoya -he would like to see patient on office in 2 weeks. Please schedule an appointment. ?

## 2021-04-25 NOTE — Hospital Course (Signed)
Mr. Gerald [name] is a 56 year old male with history of morbid obesity, CAD s/p stenting, HTN, HLD, GERD and nonischemic cardiomyopathy who came to the hospital complaining of chest pain which woke him up in sleep.  The chest pain LOC localized in the left side of chest, 10/10 in severity, stabbing-like, worse with deep breath.  Lasted about 2.5 hours.  It was associate with shortness of breath.  CT angiogram of the chest did not show any PE.  Troponin was negative.  BNP within normal limits. ?Patient had coronary angiogram in 2013, 2015 and 2018, all reviewed 30% stenosis in LAD. ?Currently, patient chest pain has resolved, low risk for cardiac source.  Medically stable to be discharged. ?

## 2021-04-25 NOTE — Progress Notes (Signed)
*  PRELIMINARY RESULTS* ?Echocardiogram ?2D Echocardiogram has been performed. ? ?Gerald [name] ?04/25/2021, 10:35 AM ?

## 2021-04-25 NOTE — Telephone Encounter (Signed)
LVM for pt to call our office to schedule a 2 week follow up appt per Dr. Hampton Abbot. ?

## 2021-04-25 NOTE — Discharge Summary (Signed)
Physician Discharge Summary   Patient: Gerald [name] MRN: [id number] DOB: March 22, 1965  Admit date:     04/24/2021  Discharge date: 04/25/21  Discharge Physician: Marrion Coy   PCP: Lyndon Code, MD   Recommendations at discharge:   Follow-up with PCP in 1 week. Follow-up with Dr. Mariah Milling in 3 weeks  Discharge Diagnoses: Active Problems:   HTN (hypertension)   Mixed hyperlipidemia   Nonischemic cardiomyopathy (HCC)   Type 2 diabetes mellitus (HCC)   Generalized anxiety disorder   Chest pain   Postoperative abdominal pain   Mesenteric panniculitis (HCC)  Resolved Problems:   * No resolved hospital problems. [facility] Course: Gerald [name] is a 56 year old male with history of morbid obesity, CAD s/p stenting, HTN, HLD, GERD and nonischemic cardiomyopathy who came to the hospital complaining of chest pain which woke him up in sleep.  The chest pain LOC localized in the left side of chest, 10/10 in severity, stabbing-like, worse with deep breath.  Lasted about 2.5 hours.  It was associate with shortness of breath.  CT angiogram of the chest did not show any PE.  Troponin was negative.  BNP within normal limits. Patient had coronary angiogram in 2013, 2015 and 2018, all reviewed 30% stenosis in LAD. Currently, patient chest pain has resolved, low risk for cardiac source.  Medically stable to be discharged.  Assessment and Plan: Mesenteric panniculitis (HCC) I examined patient, condition seem to have improved  Postoperative abdominal pain Patient has postop wound infection, which essentially has resolved.  Chest pain Patient has a lower risk for cardiac condition.  Ready by cardiology before discharge.  PE ruled out.  Patient will need to follow-up with PCP and cardiology as outpatient  Type 2 diabetes mellitus (HCC) Resume home treatment.  Nonischemic cardiomyopathy (HCC) Condition stable.  Mixed hyperlipidemia Resume home regimen.  HTN (hypertension) Resume  home regimen.         Consultants: Cardiology Procedures performed: None  Disposition: Home Diet recommendation:  Discharge Diet Orders (From admission, onward)     Start     Ordered   04/25/21 0000  Diet - low sodium heart healthy        04/25/21 1023           Carb modified diet DISCHARGE MEDICATION: Allergies as of 04/25/2021       Reactions   Statins Rash           Medication List     STOP taking these medications    ibuprofen 800 MG tablet Commonly known as: ADVIL       TAKE these medications    acetaminophen 500 MG tablet Commonly known as: TYLENOL Take 2 tablets (1,000 mg total) by mouth every 6 (six) hours as needed for mild pain.   amoxicillin-clavulanate 875-125 MG tablet Commonly known as: Augmentin Take 1 tablet by mouth 2 (two) times daily for 10 days.   buPROPion 150 MG 24 hr tablet Commonly known as: Wellbutrin XL Take 1 tablet (150 mg total) by mouth daily. What changed: when to take this   citalopram 40 MG tablet Commonly known as: CELEXA TAKE 1 TABLET BY MOUTH EVERY DAY FOR GAD What changed:  how much to take how to take this when to take this   gabapentin 100 MG capsule Commonly known as: NEURONTIN Take 200 mg by mouth 3 (three) times daily.   lisinopril-hydrochlorothiazide 20-25 MG tablet Commonly known as: ZESTORETIC Take 1 tablet by mouth in the morning and  at bedtime.   MULTIVITAMIN ADULTS PO Take 1 tablet by mouth daily.   Na Sulfate-K Sulfate-Mg Sulf 17.5-3.13-1.6 GM/177ML Soln   nitroGLYCERIN 0.4 MG SL tablet Commonly known as: NITROSTAT Place 1 tablet (0.4 mg total) under the tongue every 5 (five) minutes x 3 doses as needed for chest pain.   oxyCODONE 5 MG immediate release tablet Commonly known as: Oxy IR/ROXICODONE Take 1 tablet (5 mg total) by mouth every 4 (four) hours as needed for severe pain.        Follow-up Information     Lyndon Code, MD Follow up in 1 week(s).   Specialties: Internal  Medicine, Cardiology Contact information: [address] [place] [address] [phone]         Antonieta Iba, MD Follow up in 3 week(s).   Specialty: Cardiology Contact information: [address] [place] [address] [phone]                Discharge Exam: Ceasar Mons Weights   04/24/21 1046 04/24/21 1729 04/25/21 0400  Weight: (!) 145 kg (!) 137.9 kg (!) 143 kg   General exam: Appears calm and comfortable  Respiratory system: Clear to auscultation. Respiratory effort normal. Cardiovascular system: S1 & S2 heard, RRR. No JVD, murmurs, rubs, gallops or clicks. No pedal edema. Gastrointestinal system: Abdomen is nondistended, soft and nontender. No organomegaly or masses felt. Normal bowel sounds heard. Central nervous system: Alert and oriented. No focal neurological deficits. Extremities: Symmetric 5 x 5 power. Skin: No rashes, lesions or ulcers Psychiatry: Judgement and insight appear normal. Mood & affect appropriate.    Condition at discharge: good  The results of significant diagnostics from this hospitalization (including imaging, microbiology, ancillary and laboratory) are listed below for reference.   Imaging Studies: DG Chest 2 View  Result Date: 04/19/2021 CLINICAL DATA:  Shortness of breath EXAM: CHEST - 2 VIEW COMPARISON:  Radiograph 02/28/2021 FINDINGS: Unchanged cardiomediastinal silhouette. There is no focal airspace consolidation. No pleural effusion. No pneumothorax. No acute osseous abnormality. Thoracic spondylosis. IMPRESSION: No evidence of acute cardiopulmonary disease. Electronically Signed   By: Caprice Renshaw M.D.   On: 04/19/2021 16:54   CT Angio Chest PE W and/or Wo Contrast  Result Date: 04/24/2021 CLINICAL DATA:  post op hernia, sudden SOB and left sided chest pain. eval PE; post op hernia repair. Started on ABX last week for infection. Worsening LLQ/left inguinal pain. eval progression EXAM: CT ANGIOGRAPHY CHEST CT  ABDOMEN AND PELVIS WITH CONTRAST TECHNIQUE: Multidetector CT imaging of the chest was performed using the standard protocol during bolus administration of intravenous contrast. Multiplanar CT image reconstructions and MIPs were obtained to evaluate the vascular anatomy. Multidetector CT imaging of the abdomen and pelvis was performed using the standard protocol during bolus administration of intravenous contrast. RADIATION DOSE REDUCTION: This exam was performed according to the departmental dose-optimization program which includes automated exposure control, adjustment of the mA and/or kV according to patient size and/or use of iterative reconstruction technique. CONTRAST:  OMNIPAQUE IOHEXOL 350 MG/ML SOLN COMPARISON:  07/04/2018, 04/19/2021 FINDINGS: CTA CHEST FINDINGS Cardiovascular: Satisfactory opacification of the pulmonary arteries to the segmental level. No evidence of pulmonary embolism. Thoracic aorta is nonaneurysmal. Normal heart size. No pericardial effusion. Mediastinum/Nodes: No enlarged mediastinal, hilar, or axillary lymph nodes. Thyroid gland, trachea, and esophagus demonstrate no significant findings. Lungs/Pleura: Lungs are clear. No pleural effusion or pneumothorax. Musculoskeletal: Bilateral gynecomastia. No acute osseous abnormality. Review of the MIP images confirms the above findings.  CT ABDOMEN and PELVIS FINDINGS Hepatobiliary: No focal liver abnormality is seen. No gallstones, gallbladder wall thickening, or biliary dilatation. Pancreas: Unremarkable. No pancreatic ductal dilatation or surrounding inflammatory changes. Spleen: Normal in size without focal abnormality. Adrenals/Urinary Tract: Unremarkable adrenal glands. Kidneys enhance symmetrically without focal lesion, stone, or hydronephrosis. Ureters are nondilated. Urinary bladder appears unremarkable. Stomach/Bowel: Endoluminal clips again noted within the stomach. Stomach is otherwise within normal limits. Appendix appears  normal. No evidence of bowel wall thickening, distention, or inflammatory changes. Vascular/Lymphatic: No significant vascular findings are present. No enlarged abdominal or pelvic lymph nodes. Redemonstrated misty mesentery with multiple small central mesenteric lymph nodes, unchanged. Reproductive: Prostate is unremarkable. Other: Small amount of fluid is again seen within the left inguinal canal. Previously seen air within the left inguinal soft tissues has resolved. Minimal residual subcutaneous air at the umbilicus related to recent surgery, improved. No recurrent umbilical hernia. No ascites. No pneumoperitoneum. No organized intra-or pelvic fluid collections. Musculoskeletal: No acute osseous abnormality. Review of the MIP images confirms the above findings. IMPRESSION: 1. No evidence of pulmonary embolism or other acute cardiopulmonary process. 2. No acute abdominopelvic findings. 3. Interval resolution of previously seen air within the left inguinal soft tissues. Small amount of fluid within the left inguinal canal appears unchanged. 4. Redemonstrated misty mesentery with multiple small central mesenteric lymph nodes, which can be seen in the setting of mesenteric panniculitis. Electronically Signed   By: Duanne Guess D.O.   On: 04/24/2021 12:41   CT ABDOMEN PELVIS W CONTRAST  Result Date: 04/24/2021 CLINICAL DATA:  post op hernia, sudden SOB and left sided chest pain. eval PE; post op hernia repair. Started on ABX last week for infection. Worsening LLQ/left inguinal pain. eval progression EXAM: CT ANGIOGRAPHY CHEST CT ABDOMEN AND PELVIS WITH CONTRAST TECHNIQUE: Multidetector CT imaging of the chest was performed using the standard protocol during bolus administration of intravenous contrast. Multiplanar CT image reconstructions and MIPs were obtained to evaluate the vascular anatomy. Multidetector CT imaging of the abdomen and pelvis was performed using the standard protocol during bolus  administration of intravenous contrast. RADIATION DOSE REDUCTION: This exam was performed according to the departmental dose-optimization program which includes automated exposure control, adjustment of the mA and/or kV according to patient size and/or use of iterative reconstruction technique. CONTRAST:  OMNIPAQUE IOHEXOL 350 MG/ML SOLN COMPARISON:  07/04/2018, 04/19/2021 FINDINGS: CTA CHEST FINDINGS Cardiovascular: Satisfactory opacification of the pulmonary arteries to the segmental level. No evidence of pulmonary embolism. Thoracic aorta is nonaneurysmal. Normal heart size. No pericardial effusion. Mediastinum/Nodes: No enlarged mediastinal, hilar, or axillary lymph nodes. Thyroid gland, trachea, and esophagus demonstrate no significant findings. Lungs/Pleura: Lungs are clear. No pleural effusion or pneumothorax. Musculoskeletal: Bilateral gynecomastia. No acute osseous abnormality. Review of the MIP images confirms the above findings. CT ABDOMEN and PELVIS FINDINGS Hepatobiliary: No focal liver abnormality is seen. No gallstones, gallbladder wall thickening, or biliary dilatation. Pancreas: Unremarkable. No pancreatic ductal dilatation or surrounding inflammatory changes. Spleen: Normal in size without focal abnormality. Adrenals/Urinary Tract: Unremarkable adrenal glands. Kidneys enhance symmetrically without focal lesion, stone, or hydronephrosis. Ureters are nondilated. Urinary bladder appears unremarkable. Stomach/Bowel: Endoluminal clips again noted within the stomach. Stomach is otherwise within normal limits. Appendix appears normal. No evidence of bowel wall thickening, distention, or inflammatory changes. Vascular/Lymphatic: No significant vascular findings are present. No enlarged abdominal or pelvic lymph nodes. Redemonstrated misty mesentery with multiple small central mesenteric lymph nodes, unchanged. Reproductive: Prostate is unremarkable. Other: Small amount of fluid is  again seen within  the left inguinal canal. Previously seen air within the left inguinal soft tissues has resolved. Minimal residual subcutaneous air at the umbilicus related to recent surgery, improved. No recurrent umbilical hernia. No ascites. No pneumoperitoneum. No organized intra-or pelvic fluid collections. Musculoskeletal: No acute osseous abnormality. Review of the MIP images confirms the above findings. IMPRESSION: 1. No evidence of pulmonary embolism or other acute cardiopulmonary process. 2. No acute abdominopelvic findings. 3. Interval resolution of previously seen air within the left inguinal soft tissues. Small amount of fluid within the left inguinal canal appears unchanged. 4. Redemonstrated misty mesentery with multiple small central mesenteric lymph nodes, which can be seen in the setting of mesenteric panniculitis. Electronically Signed   By: Duanne Guess D.O.   On: 04/24/2021 12:41   CT ABDOMEN PELVIS W CONTRAST  Result Date: 04/19/2021 CLINICAL DATA:  Peritonitis or perforation suspected Abdominal pain, acute, nonlocalized 3 days s/p robatic lap for bilateral inguinal repair w/ mesh. acute, nonlocalized 3 days s/p robatic lap for bilateral inguinal repair w/ mesh EXAM: CT ABDOMEN AND PELVIS WITH CONTRAST TECHNIQUE: Multidetector CT imaging of the abdomen and pelvis was performed using the standard protocol following bolus administration of intravenous contrast. RADIATION DOSE REDUCTION: This exam was performed according to the departmental dose-optimization program which includes automated exposure control, adjustment of the mA and/or kV according to patient size and/or use of iterative reconstruction technique. CONTRAST:  OMNIPAQUE IOHEXOL 300 MG/ML  SOLN COMPARISON:  None. FINDINGS: Lower chest: No acute abnormality. Hepatobiliary: No focal liver abnormality. No gallstones, gallbladder wall thickening, or pericholecystic fluid. No biliary dilatation. Pancreas: No focal lesion. Normal pancreatic  contour. No surrounding inflammatory changes. No main pancreatic ductal dilatation. Spleen: Normal in size without focal abnormality. Adrenals/Urinary Tract: No adrenal nodule bilaterally. Bilateral kidneys enhance symmetrically. No hydronephrosis. No hydroureter. The urinary bladder is unremarkable. On delayed imaging, there is no urothelial wall thickening and there are no filling defects in the opacified portions of the bilateral collecting systems or ureters. Stomach/Bowel: Endoluminal clip within the stomach. Stomach is within normal limits. No evidence of bowel wall thickening or dilatation. Appendix appears normal. Vascular/Lymphatic: No abdominal aorta or iliac aneurysm. Mild atherosclerotic plaque of the aorta and its branches. No abdominal, pelvic, or inguinal lymphadenopathy. Reproductive: Prostate is unremarkable. Other: Slight haziness of the small bowel mesentery with prominent but nonenlarged lymph nodes. No intraperitoneal free fluid. No intraperitoneal free gas. No organized fluid collection. Musculoskeletal: Left scrotal subcutaneus soft tissue edema and emphysema. A 2.4 cm density within the left inguinal canal (2:91). The testes are collimated off view. Trace subcutaneus soft tissue edema and emphysema along the umbilicus consistent with recent laparoscopic surgery. No suspicious lytic or blastic osseous lesions. No acute displaced fracture. Multilevel degenerative changes of the spine. IMPRESSION: 1. Left scrotal subcutaneus soft tissue edema and emphysema likely postsurgical; however, a necrotizing fasciitis/Fournier's gangrene cannot be fully excluded. The testes are collimated off view. Recommend clinical correlation. 2. A 2.4 cm density lesion within the left inguinal canal. In the postsurgical setting this could represent a hematoma formation. No definite recurrent hernia. 3. Nonspecific small bowel misty mesentery. 4. Otherwise no acute intra-abdominal or intrapelvic abnormality. 5.   Aortic Atherosclerosis (ICD10-I70.0). Electronically Signed   By: Tish Frederickson M.D.   On: 04/19/2021 19:30   DG Chest Portable 1 View  Result Date: 04/24/2021 CLINICAL DATA:  Chest pain EXAM: PORTABLE CHEST 1 VIEW COMPARISON:  Chest x-ray dated April 19, 2021 FINDINGS: Unchanged cardiac and mediastinal  contours. Low lung volumes with hypoventilatory changes. No focal consolidation. The visualized skeletal structures are unremarkable. IMPRESSION: Low lung volumes with hypoventilatory changes. No focal consolidation. Electronically Signed   By: Allegra Lai M.D.   On: 04/24/2021 11:06    Microbiology: Results for orders placed or performed during the hospital encounter of 04/19/21  Culture, blood (routine x 2)     Status: None   Collection Time: 04/19/21  4:18 PM   Specimen: BLOOD  Result Value Ref Range Status   Specimen Description BLOOD LEFT ANTECUBITAL  Final   Special Requests   Final    BOTTLES DRAWN AEROBIC AND ANAEROBIC Blood Culture adequate volume   Culture   Final    NO GROWTH 5 DAYS Performed at [facility], [address] [place], [place] [address]    Report Status 04/24/2021 FINAL  Final  Culture, blood (routine x 2)     Status: None   Collection Time: 04/19/21  6:50 PM   Specimen: Left Antecubital; Blood  Result Value Ref Range Status   Specimen Description LEFT ANTECUBITAL  Final   Special Requests   Final    BOTTLES DRAWN AEROBIC AND ANAEROBIC Blood Culture adequate volume   Culture   Final    NO GROWTH 5 DAYS Performed at [facility], [address] [place], [place] [address]    Report Status 04/24/2021 FINAL  Final    Labs: CBC: Recent Labs  Lab 04/19/21 1618 04/24/21 1050 04/25/21 0450  WBC 11.3* 12.9* 10.0  NEUTROABS 7.5 9.2*  --   HGB 14.1 14.3 13.5  HCT 42.1 42.9 40.2  MCV 86.6 87.4 87.4  PLT 233 233 242   Basic Metabolic Panel: Recent Labs  Lab 04/19/21 1618 04/24/21 1050 04/25/21 0450  NA 139 134* 135  K 3.7  4.0 3.6  CL 100 100 99  CO2 29 24 27   GLUCOSE 160* 155* 195*  BUN 15 15 20   CREATININE 0.81 0.94 1.18  CALCIUM 8.9 8.8* 8.8*   Liver Function Tests: Recent Labs  Lab 04/19/21 1618 04/24/21 1050  AST 30 26  ALT 32 29  ALKPHOS 83 78  BILITOT 0.6 1.0  PROT 7.3 7.3  ALBUMIN 3.7 3.8   CBG: No results for input(s): GLUCAP in the last 168 hours.  Discharge time spent: less than 30 minutes.  Signed: Marrion Coy, MD [facility] 04/25/2021

## 2021-04-25 NOTE — TOC CM/SW Note (Addendum)
CSW acknowledges consult for heart failure screen. Heart Failure Nurse Navigator is aware. ? ?[place] Scrape, [place] ?[phone] ? ?10:35 am: Patient has orders to discharge home today. Chart reviewed. PCP is Clayborn Bigness, MD. On room air. No wounds. No TOC needs identified. CSW signing off. ? ?[place] Scrape, [place] ?[phone] ? ?

## 2021-04-29 ENCOUNTER — Encounter: Payer: Self-pay | Admitting: Physician Assistant

## 2021-04-29 ENCOUNTER — Ambulatory Visit: Payer: 59 | Admitting: Physician Assistant

## 2021-04-29 ENCOUNTER — Telehealth: Payer: Self-pay

## 2021-04-29 ENCOUNTER — Other Ambulatory Visit: Payer: Self-pay

## 2021-04-29 VITALS — BP 134/77 | HR 67 | Temp 97.6°F | Resp 16 | Ht 74.0 in | Wt 317.4 lb

## 2021-04-29 DIAGNOSIS — Z09 Encounter for follow-up examination after completed treatment for conditions other than malignant neoplasm: Secondary | ICD-10-CM

## 2021-04-29 DIAGNOSIS — K760 Fatty (change of) liver, not elsewhere classified: Secondary | ICD-10-CM

## 2021-04-29 DIAGNOSIS — R748 Abnormal levels of other serum enzymes: Secondary | ICD-10-CM | POA: Diagnosis not present

## 2021-04-29 DIAGNOSIS — Z9889 Other specified postprocedural states: Secondary | ICD-10-CM

## 2021-04-29 DIAGNOSIS — M7989 Other specified soft tissue disorders: Secondary | ICD-10-CM | POA: Diagnosis not present

## 2021-04-29 DIAGNOSIS — I10 Essential (primary) hypertension: Secondary | ICD-10-CM

## 2021-04-29 DIAGNOSIS — Z8719 Personal history of other diseases of the digestive system: Secondary | ICD-10-CM

## 2021-04-29 MED ORDER — FUROSEMIDE 20 MG PO TABS: ORAL_TABLET | ORAL | 3 refills | Status: DC

## 2021-04-29 MED ORDER — GABAPENTIN 100 MG PO CAPS: 200.0000 mg | ORAL_CAPSULE | Freq: Three times a day (TID) | ORAL | 2 refills | Status: DC

## 2021-04-29 NOTE — Progress Notes (Signed)
[facility] ?[address] ?[place], [place] [address] ? ?Internal MEDICINE  ?Office Visit Note ? ?Patient Name: Gerald [name] ? [id number]  ?[id number] ? ?Date of Service: 04/30/2021 ? ? ? ? ?Chief Complaint  ?Patient presents with  ? Follow-up  ? Edema  ?  Feels like he has excess fluid, esp in legs  ? ? ? ?HPI ?Pt is here for recent hospital follow up and is accompanied by his wife. ?-On 04/16/21 had triple hernia repair and ended up in ED on 04/19/21 with acute infection of surgical site and was treated and sent home. States infection has resolved, but does still have a little pain from procedure and energy levels not back to where they were. ?-Wednesday morning (04/24/21) woke up with chest pain in middle of night and SOB. Sat up and tried to rest. Martin Majestic to postop visit with surgery and BP elevated and was still having CP so they called EMS and he went to ED . In ED he was found to have some left sided abdominal pain that improves with sitting up and Sob when laving flat as well as increase in LE swelling.  Labs were done in ED which did show slight elevation of WBC with troponins negative and BNP within normal limits.  Chest x-ray, CTA chest, CT abdomen and pelvis were all done.  CTA chest showed no PE and CT abdomen pelvis showed no intra-abdominal process but did show mesenteric panniculitis.  He was treated with morphine, Toradol, fentanyl, aspirin.  Based on cardiac history and following stenting it was decided the patient should stay for observation and was discharged on 04/25/21.  An echo was also ordered and showed normal LVEF of 55% with no regional wall abnormalities.  Left ventricular diastolic parameters consistent with grade 2 diastolic dysfunction.  No evidence of pericardial effusion and no valvular abnormalities noted.  Given improvement in chest pain and reassuring lab and imaging findings patient was discharged and has follow-up scheduled with cardiology in a few weeks ?-In office today  patient is worried about fluid buildup.  States that some days his legs will swell.  In office today slight swelling of right lower extremity but overall does not appear fluid overloaded.  Discussed starting Lasix on an as-needed basis for increased leg swelling and to keep legs elevated as able.  Patient advised to contact office if he is needing to take Lasix multiple days in a row as updated labs may be indicated. Denies Sob or CP in office. ?-Will stop wellbutrin because he states he has not noticed that this is helping much and thinks it may be contributing to some of his worsening fatigue.  Also discussed that recent health events and recovering from surgery and infection may also be contributing to fatigue and may revisit this medication in the future.  He admits that he was hoping to fully recover from surgery at this point but we discussed that he did just have 3 hernia repairs as well as a postoperative infection and that this takes time to fully recover patient admits understanding ?-keto Acv gummies started a few weeks ago ?-GI follow up in May, states he was told he is supposed to get hepatitis A/B vaccination.  States he is unaware of need for rheumatology referral.  Discussed that this is indicated based on very elevated CK finding at previous lab drawl and will therefore go ahead and place rheumatology referral at this time rather than waiting until next GI visit to discuss ? ?  Ct Abd/pelvis 04/24/21: ?IMPRESSION: ?1. No evidence of pulmonary embolism or other acute cardiopulmonary ?process. ?2. No acute abdominopelvic findings. ?3. Interval resolution of previously seen air within the left ?inguinal soft tissues. Small amount of fluid within the left ?inguinal canal appears unchanged. ?4. Redemonstrated misty mesentery with multiple small central ?mesenteric lymph nodes, which can be seen in the setting of ?mesenteric panniculitis. ? ?CTA CHEST FINDINGS: ?Cardiovascular: Satisfactory opacification of  the pulmonary arteries ?to the segmental level. No evidence of pulmonary embolism. Thoracic ?aorta is nonaneurysmal. Normal heart size. No pericardial effusion. ?  ?Mediastinum/Nodes: No enlarged mediastinal, hilar, or axillary lymph ?nodes. Thyroid gland, trachea, and esophagus demonstrate no ?significant findings. ?  ?Lungs/Pleura: Lungs are clear. No pleural effusion or pneumothorax. ?  ?Musculoskeletal: Bilateral gynecomastia. No acute osseous ?abnormality. ? ?Current Medication: ?Outpatient Encounter Medications as of 04/29/2021  ?Medication Sig Note  ? acetaminophen (TYLENOL) 500 MG tablet Take 2 tablets (1,000 mg total) by mouth every 6 (six) hours as needed for mild pain.   ? amoxicillin-clavulanate (AUGMENTIN) 875-125 MG tablet Take 1 tablet by mouth 2 (two) times daily for 10 days. 04/24/2021: 04/20/21- 04/30/21  ? buPROPion (WELLBUTRIN XL) 150 MG 24 hr tablet Take 1 tablet (150 mg total) by mouth daily. (Patient taking differently: Take 150 mg by mouth every evening.)   ? citalopram (CELEXA) 40 MG tablet TAKE 1 TABLET BY MOUTH EVERY DAY FOR GAD (Patient taking differently: Take 40 mg by mouth every evening. TAKE 1 TABLET BY MOUTH EVERY DAY FOR GAD)   ? furosemide (LASIX) 20 MG tablet Take 1 tablet by mouth daily as needed for leg swelling   ? lisinopril-hydrochlorothiazide (ZESTORETIC) 20-25 MG tablet Take 1 tablet by mouth in the morning and at bedtime.   ? Multiple Vitamins-Minerals (MULTIVITAMIN ADULTS PO) Take 1 tablet by mouth daily.   ? Na Sulfate-K Sulfate-Mg Sulf 17.5-3.13-1.6 GM/177ML SOLN    ? nitroGLYCERIN (NITROSTAT) 0.4 MG SL tablet Place 1 tablet (0.4 mg total) under the tongue every 5 (five) minutes x 3 doses as needed for chest pain.   ? oxyCODONE (OXY IR/ROXICODONE) 5 MG immediate release tablet Take 1 tablet (5 mg total) by mouth every 4 (four) hours as needed for severe pain.   ? [DISCONTINUED] gabapentin (NEURONTIN) 100 MG capsule Take 200 mg by mouth 3 (three) times daily.   ?  gabapentin (NEURONTIN) 100 MG capsule Take 2 capsules (200 mg total) by mouth 3 (three) times daily.   ? ?No facility-administered encounter medications on file as of 04/29/2021.  ? ? ?Surgical History: ?Past Surgical History:  ?Procedure Laterality Date  ? BONE EXCISION Right 11/21/2020  ? Procedure: PART EXCISION BONE- PHALANX;  Surgeon: Samara Deist, DPM;  Location: [facility];  Service: Podiatry;  Laterality: Right;  ? CARDIAC CATHETERIZATION N/A 09/2011  ? [facility]; EF 50% with 30% mid LAD stenosis and no obstructive disease.  ? CARDIAC CATHETERIZATION  09/2010  ? [place]; Mid LAD 40% stenosis; Mid Circumflex:Normal; Mid RCA; Normal  ? CARDIAC CATHETERIZATION    ? COLONOSCOPY    ? COLONOSCOPY WITH PROPOFOL N/A 04/10/2021  ? Procedure: COLONOSCOPY WITH PROPOFOL;  Surgeon: Jonathon Bellows, MD;  Location[facility] ENDOSCOPY;  Service: Gastroenterology;  Laterality: N/A;  ? ESOPHAGOGASTRODUODENOSCOPY N/A 04/10/2021  ? Procedure: ESOPHAGOGASTRODUODENOSCOPY (EGD);  Surgeon: Jonathon Bellows, MD;  Location: [facility] ENDOSCOPY;  Service: Gastroenterology;  Laterality: N/A;  ? ESOPHAGOGASTRODUODENOSCOPY (EGD) WITH PROPOFOL N/A 06/10/2017  ? Procedure: ESOPHAGOGASTRODUODENOSCOPY (EGD) WITH PROPOFOL;  Surgeon: Jonathon Bellows, MD;  Location: [facility] ENDOSCOPY;  Service: Gastroenterology;  Laterality: N/A;  ? HAMMER TOE SURGERY Right 11/21/2020  ? Procedure: HAMMERTOE CORRECTION;  Surgeon: Samara Deist, DPM;  Location: [place];  Service: Podiatry;  Laterality: Right;  ? INSERTION OF MESH  04/16/2021  ? Procedure: INSERTION OF MESH;  Surgeon: Olean Ree, MD;  Location: [facility] ORS;  Service: General;;  ? KNEE ARTHROSCOPY WITH MEDIAL MENISECTOMY Right 09/16/2012  ? Procedure: RIGHT KNEE ARTHROSCOPY WITH MEDIAL AND LATERAL MENISECTOMY, CHONDROPLASTY;  Surgeon: Ninetta Lights, MD;  Location: [facility];  Service: Orthopedics;  Laterality: Right;  RIGHT KNEE SCOPE MEDIAL MENISCECTOMY  ? LEFT HEART CATH AND CORONARY ANGIOGRAPHY  N/A 06/11/2016  ? Procedure: Left Heart Cath and Coronary Angiography;  Surgeon: Minna Merritts, MD;  Location: [facility];  Service: Cardiovascular;  Laterality: N/A;  ? UMBILICAL HERNIA REPAIR N/A

## 2021-04-29 NOTE — Telephone Encounter (Signed)
Awaiting 04/29/21 office notes for rheumatology referral-Toni ?

## 2021-04-30 ENCOUNTER — Encounter: Payer: 59 | Admitting: Physician Assistant

## 2021-05-01 NOTE — Telephone Encounter (Signed)
Referral sent via [employer] to No Name ?

## 2021-05-02 ENCOUNTER — Telehealth: Payer: Self-pay | Admitting: *Deleted

## 2021-05-02 NOTE — Telephone Encounter (Signed)
Faxed FMLA to [phone] ?

## 2021-05-08 ENCOUNTER — Encounter: Payer: Self-pay | Admitting: Surgery

## 2021-05-08 ENCOUNTER — Ambulatory Visit (INDEPENDENT_AMBULATORY_CARE_PROVIDER_SITE_OTHER): Payer: 59 | Admitting: Surgery

## 2021-05-08 VITALS — BP 157/89 | HR 67 | Temp 98.3°F | Ht 74.0 in | Wt 316.0 lb

## 2021-05-08 DIAGNOSIS — Z09 Encounter for follow-up examination after completed treatment for conditions other than malignant neoplasm: Secondary | ICD-10-CM

## 2021-05-08 DIAGNOSIS — K429 Umbilical hernia without obstruction or gangrene: Secondary | ICD-10-CM

## 2021-05-08 DIAGNOSIS — K4020 Bilateral inguinal hernia, without obstruction or gangrene, not specified as recurrent: Secondary | ICD-10-CM

## 2021-05-08 NOTE — Progress Notes (Signed)
05/08/2021 ? ?HPI: ?NARESH ALTHAUS is a 56 y.o. male s/p robotic assisted bilateral inguinal hernia repair and umbilical hernia repair on 04/16/21.  Patient was seen in the ED on 04/19/21 for wound infection and then in the office on 04/24/21.  On that visit, the patient was complaining of chest pain and appeared in distress.  He was sent via EMS to the ED and was admitted overnight for chest pain workup which was negative.  Today, patient reports that the infection has resolved and there is no more erythema over the incisions.  He does report some soreness still in the umbilicus and left groin, but has been improving overall since surgery. ? ?Vital signs: ?BP (!) 157/89   Pulse 67   Temp 98.3 ?F (36.8 ?C)   Ht '6\' 2"'$  (1.88 m)   Wt (!) 316 lb (143.3 kg)   SpO2 97%   BMI 40.57 kg/m?   ? ?Physical Exam: ?Constitutional:  No acute distress. ?Abdomen:  Soft, non-distended, with some soreness to palpation at the umbilicus and the left groin.  No evidence of hernia recurrence in either location.  Incisions are healing well, without further erythema.  ? ?Assessment/Plan: ?This is a 56 y.o. male s/p robotic bilateral inguinal hernia repair and open umbilical hernia repair. ? ?--Discussed with the patient that the wounds are healing well without further erythema or infection.  There is a scab at the umbilical wound, and this will fall off as the skin underneath heals.  With regards to the discomfort, I think right now it is still likely post-operative in nature given the extent of surgery, particularly with the left side being more symptomatic than the right, and the wound infection at the umbilical area.  The discomfort has been improving which is reassuring.  Discussed with him the possibility of groin neuralgia that could be chronic in nature as a result of the surgery, but again, his discomfort has been improving and he's only 3 weeks out from surgery. ?--For now, recommended that he allow things to continue to settle  down, and if there's still discomfort after 2 more months, he should let us know so we can re-evaluate him. ?--Follow up as needed.  All of his questions have been answered. ? ? ?Melvyn Neth, MD ?[facility]  ?

## 2021-05-08 NOTE — Patient Instructions (Addendum)
Follow-up with our office as needed. ? ?Please call and ask to speak with a nurse if you develop questions or concerns. ? ? ?GENERAL POST-OPERATIVE ?PATIENT INSTRUCTIONS  ? ?WOUND CARE INSTRUCTIONS: Try to keep the wound dry and avoid ointments on the wound unless directed to do so.  If the wound becomes bright red and painful or starts to drain infected material that is not clear, please contact your physician immediately.  If the wound is mildly pink and has a thick firm ridge underneath it, this is normal, and is referred to as a healing ridge.  This will resolve over the next 4-6 weeks. ? ?BATHING: ?You may shower if you have been informed of this by your surgeon. However, Please do not submerge in a tub, hot tub, or pool until incisions are completely sealed or have been told by your surgeon that you may do so. ? ?DIET:  You may eat any foods that you can tolerate.  It is a good idea to eat a high fiber diet and take in plenty of fluids to prevent constipation.  If you do become constipated you may want to take a mild laxative or take ducolax tablets on a daily basis until your bowel habits are regular.  Constipation can be very uncomfortable, along with straining, after recent surgery. ? ?ACTIVITY: You are encouraged to walk and engage in light activity for the next two weeks.  You should not lift more than 20 pounds for 6 weeks total after surgery as it could put you at increased risk for complications.  Twenty pounds is roughly equivalent to a plastic bag of groceries. At that time- Listen to your body when lifting, if you have pain when lifting, stop and then try again in a few days. Soreness after doing exercises or activities of daily living is normal as you get back in to your normal routine. ? ?MEDICATIONS:  Try to take narcotic medications and anti-inflammatory medications, such as tylenol, ibuprofen, naprosyn, etc., with food.  This will minimize stomach upset from the medication.  Should you develop  nausea and vomiting from the pain medication, or develop a rash, please discontinue the medication and contact your physician.  You should not drive, make important decisions, or operate machinery when taking narcotic pain medication. ? ?SUNBLOCK ?Use sun block to incision area over the next year if this area will be exposed to sun. This helps decrease scarring and will allow you avoid a permanent darkened area over your incision. ? ?QUESTIONS:  Please feel free to call our office if you have any questions, and we will be glad to assist you. [phone] ? ? ?

## 2021-05-13 ENCOUNTER — Other Ambulatory Visit: Payer: Self-pay

## 2021-05-13 ENCOUNTER — Telehealth: Payer: Self-pay

## 2021-05-13 MED ORDER — AMLODIPINE BESYLATE 5 MG PO TABS: 5.0000 mg | ORAL_TABLET | Freq: Every day | ORAL | 3 refills | Status: DC

## 2021-05-13 NOTE — Telephone Encounter (Signed)
Spoke to [facility] and she advised for pt to take only 1 lisinopril/HCTZ daily and she added Amlodipine 5 mg once a day.  Pt understood instructions.  Advised to let us know how things go. ?

## 2021-05-13 NOTE — Telephone Encounter (Signed)
Error on this note please disregard ?

## 2021-05-13 NOTE — Telephone Encounter (Signed)
Amlodipine 5 mg once a day, and only take 1 tablet daily of the lisinopril/HCTZ ?

## 2021-05-13 NOTE — Telephone Encounter (Signed)
Pt called and advised that his insurance was not going to cover the Ipratropium 0.02% nebulizer salutation.  I called [facility] and spoke to [facility] and she advised that the insurance will cover  225 ML (3 boxes) every 30 days. And that patient needs to let 1 box last him  ?

## 2021-05-14 ENCOUNTER — Telehealth: Payer: Self-pay | Admitting: *Deleted

## 2021-05-14 NOTE — Telephone Encounter (Signed)
Patient called and would like to come pick up a return to work note stating that he can return to work on 05/28/21 with no restrictions.  ? ? ?

## 2021-05-14 NOTE — Telephone Encounter (Signed)
This encounter was created in error - please disregard.

## 2021-05-14 NOTE — Addendum Note (Signed)
Addended by: Edd Arbour B on: 05/14/2021 09:49 AM ? ? Modules accepted: Orders ? ?

## 2021-05-22 ENCOUNTER — Ambulatory Visit: Payer: 59 | Admitting: Cardiovascular Disease

## 2021-06-01 ENCOUNTER — Other Ambulatory Visit: Payer: Self-pay | Admitting: Physician Assistant

## 2021-06-18 ENCOUNTER — Telehealth: Payer: Self-pay

## 2021-06-18 NOTE — Telephone Encounter (Signed)
Per Jinny Blossom w/ [facility] rheumatology, patient declined to schedule appointment-Toni ?

## 2021-06-27 ENCOUNTER — Ambulatory Visit: Payer: 59 | Admitting: Gastroenterology

## 2021-06-28 ENCOUNTER — Encounter: Payer: Self-pay | Admitting: Gastroenterology

## 2021-07-08 ENCOUNTER — Ambulatory Visit (INDEPENDENT_AMBULATORY_CARE_PROVIDER_SITE_OTHER): Payer: 59 | Admitting: Physician Assistant

## 2021-07-08 ENCOUNTER — Encounter: Payer: Self-pay | Admitting: Physician Assistant

## 2021-07-08 DIAGNOSIS — K760 Fatty (change of) liver, not elsewhere classified: Secondary | ICD-10-CM

## 2021-07-08 DIAGNOSIS — E1165 Type 2 diabetes mellitus with hyperglycemia: Secondary | ICD-10-CM

## 2021-07-08 DIAGNOSIS — R946 Abnormal results of thyroid function studies: Secondary | ICD-10-CM

## 2021-07-08 DIAGNOSIS — E782 Mixed hyperlipidemia: Secondary | ICD-10-CM | POA: Diagnosis not present

## 2021-07-08 DIAGNOSIS — I10 Essential (primary) hypertension: Secondary | ICD-10-CM | POA: Diagnosis not present

## 2021-07-08 DIAGNOSIS — R5383 Other fatigue: Secondary | ICD-10-CM

## 2021-07-08 NOTE — Progress Notes (Signed)
[facility] [place], [place] [address]  Internal MEDICINE  Office Visit Note  Patient Name: Gerald [name]  [id number]  [id number]  Date of Service: 07/16/2021  Chief Complaint  Patient presents with   Follow-up   Hyperlipidemia   Hypertension   Quality Metric Gaps    Foot Exam    HPI Pt is here for routine follow up -BP well controlled. Taking lasix daily and added amlodipine. Discussed lasix can be taken as needed for swelling and since BP greatly improved. -has dropped 19lbs since last visit. Has been really working on losing weight. Has been severely restricting calories. Went down to 1200 calories and advised to aim for 1800 since he sometimes will feel lightheaded and may be restricting too much to get enough nutrients he needs for the amount of exercise he does. He will scale back to more appropriate caloric restriction  -declined to schedule with rheumatology. Interested in moving forward with scheduling now and will reach back out for this due to elevated CK levels. -Gi appt coming up soon  Current Medication: Outpatient Encounter Medications as of 07/08/2021  Medication Sig   acetaminophen (TYLENOL) 500 MG tablet Take 2 tablets (1,000 mg total) by mouth every 6 (six) hours as needed for mild pain.   amLODipine (NORVASC) 5 MG tablet Take 1 tablet (5 mg total) by mouth daily.   gabapentin (NEURONTIN) 100 MG capsule Take 2 capsules (200 mg total) by mouth 3 (three) times daily.   lisinopril-hydrochlorothiazide (ZESTORETIC) 20-25 MG tablet Take 1 tablet by mouth daily.   Multiple Vitamins-Minerals (MULTIVITAMIN ADULTS PO) Take 1 tablet by mouth daily.   Na Sulfate-K Sulfate-Mg Sulf 17.5-3.13-1.6 GM/177ML SOLN    nitroGLYCERIN (NITROSTAT) 0.4 MG SL tablet Place 1 tablet (0.4 mg total) under the tongue every 5 (five) minutes x 3 doses as needed for chest pain.   oxyCODONE (OXY IR/ROXICODONE) 5 MG immediate release tablet Take 1 tablet (5 mg total) by  mouth every 4 (four) hours as needed for severe pain.   [DISCONTINUED] buPROPion (WELLBUTRIN XL) 150 MG 24 hr tablet Take 1 tablet (150 mg total) by mouth daily. (Patient taking differently: Take 150 mg by mouth every evening.)   [DISCONTINUED] citalopram (CELEXA) 40 MG tablet TAKE 1 TABLET BY MOUTH EVERY DAY FOR GAD (Patient taking differently: Take 40 mg by mouth every evening. TAKE 1 TABLET BY MOUTH EVERY DAY FOR GAD)   [DISCONTINUED] furosemide (LASIX) 20 MG tablet Take 1 tablet by mouth daily as needed for leg swelling   No facility-administered encounter medications on file as of 07/08/2021.    Surgical History: Past Surgical History:  Procedure Laterality Date   BONE EXCISION Right 11/21/2020   Procedure: PART EXCISION BONE- PHALANX;  Surgeon: Samara Deist, DPM;  Location: [place];  Service: Podiatry;  Laterality: Right;   CARDIAC CATHETERIZATION N/A 09/2011   [facility]; EF 50% with 30% mid LAD stenosis and no obstructive disease.   CARDIAC CATHETERIZATION  09/2010   [facility]; Mid LAD 40% stenosis; Mid Circumflex:Normal; Mid RCA; Normal   CARDIAC CATHETERIZATION     COLONOSCOPY     COLONOSCOPY WITH PROPOFOL N/A 04/10/2021   Procedure: COLONOSCOPY WITH PROPOFOL;  Surgeon: Jonathon Bellows, MD;  Location: [facility] ENDOSCOPY;  Service: Gastroenterology;  Laterality: N/A;   ESOPHAGOGASTRODUODENOSCOPY N/A 04/10/2021   Procedure: ESOPHAGOGASTRODUODENOSCOPY (EGD);  Surgeon: Jonathon Bellows, MD;  Location: [facility] ENDOSCOPY;  Service: Gastroenterology;  Laterality: N/A;   ESOPHAGOGASTRODUODENOSCOPY (EGD) WITH PROPOFOL N/A 06/10/2017   Procedure: ESOPHAGOGASTRODUODENOSCOPY (  EGD) WITH PROPOFOL;  Surgeon: Jonathon Bellows, MD;  Location: [facility] ENDOSCOPY;  Service: Gastroenterology;  Laterality: N/A;   HAMMER TOE SURGERY Right 11/21/2020   Procedure: HAMMERTOE CORRECTION;  Surgeon: Samara Deist, DPM;  Location: [place];  Service: Podiatry;  Laterality: Right;   INSERTION OF MESH  04/16/2021   Procedure:  INSERTION OF MESH;  Surgeon: Olean Ree, MD;  Location: [facility] ORS;  Service: General;;   KNEE ARTHROSCOPY WITH MEDIAL MENISECTOMY Right 09/16/2012   Procedure: RIGHT KNEE ARTHROSCOPY WITH MEDIAL AND LATERAL MENISECTOMY, CHONDROPLASTY;  Surgeon: Ninetta Lights, MD;  Location: [place];  Service: Orthopedics;  Laterality: Right;  RIGHT KNEE SCOPE MEDIAL MENISCECTOMY   LEFT HEART CATH AND CORONARY ANGIOGRAPHY N/A 06/11/2016   Procedure: Left Heart Cath and Coronary Angiography;  Surgeon: Minna Merritts, MD;  Location: [place] CV LAB;  Service: Cardiovascular;  Laterality: N/A;   UMBILICAL HERNIA REPAIR N/A 04/16/2021   Procedure: HERNIA REPAIR UMBILICAL ADULT, open;  Surgeon: Olean Ree, MD;  Location: [facility] ORS;  Service: General;  Laterality: N/A;    Medical History: Past Medical History:  Diagnosis Date   Abnormal LFTs    Anxiety    Atypical chest pain    Borderline diabetes    CAD (coronary artery disease)    a.) PCI and stent placement (unknown type) to mLAD and mLCx; date unknown. b.) LHC 09/10/2010: 40% mLAD; no intervention. c.) LHC 09/12/2011: EF 60%; 30% ISR mLAD, 40% dLAD, 30% pLCx, 30% ISR mLCx, 40% mRCA; no intervention. d.) LHC 05/23/2014: EF >55%; 30% p-mLAD; no intervention. e.) LHC 06/11/2016: EF 55-65%; LVEDP norm; 30-40% p-mLAD; no intervention.   Carcinoma ([place])    Chronic pain syndrome    Depression    Fundic gland polyps of stomach, benign    Gastritis    GERD (gastroesophageal reflux disease)    Hepatic steatosis    Hyperlipidemia    Hypertension    Migraines    Myocardial infarction [facility]) 2008   was told by PCP   Nonischemic cardiomyopathy ([place])    a.) TTE 09/12/2011: EF 35-45%. b.) LHC 09/12/2011: EF 60%. c.) TTE 06/04/2012: EF 55-60%. d.) LHC 05/23/2014: EF >55%. e.) TTE 06/11/2016: EF 55-60%; G1DD. f.) LHC 06/11/2016: EF 55-65%. g.) TTE 01/08/2018: EF 60-65%.   Obesity    Occlusion and stenosis of bilateral carotid arteries    OSA  on CPAP    Osteoarthritis of both knees    Shortness of breath    Squamous cell carcinoma of skin 02/27/2020   left distal lat deltoid - [facility]    Family History: Family History  Problem Relation Age of Onset   Heart disease Father 27       MI   Heart attack Father    Stomach cancer Father    Hypertension Mother    Breast cancer Mother     Social History   Socioeconomic History   Marital status: Married    Spouse name: Not on file   Number of children: 3   Years of education: Not on file   Highest education level: Not on file  Occupational History   Occupation: Librarian, academic  Tobacco Use   Smoking status: Never   Smokeless tobacco: Former    Quit date: 1987  Scientific laboratory technician Use: Never used  Substance and Sexual Activity   Alcohol use: Not Currently   Drug use: No   Sexual activity: Not on file  Other Topics Concern   Not  on file  Social History Narrative   Married.  15 yo son.   Wife on disability for bipolar disorder.     Social Determinants of Health   Financial Resource Strain: Not on file  Food Insecurity: Not on file  Transportation Needs: Not on file  Physical Activity: Not on file  Stress: Not on file  Social Connections: Not on file  Intimate Partner Violence: Not on file      Review of Systems  Constitutional:  Negative for chills, fatigue and unexpected weight change.  HENT:  Negative for congestion, postnasal drip, rhinorrhea, sneezing and sore throat.   Eyes:  Negative for redness.  Respiratory:  Negative for cough, chest tightness and shortness of breath.   Cardiovascular:  Negative for chest pain and palpitations.  Gastrointestinal:  Negative for abdominal pain, constipation, diarrhea, nausea and vomiting.  Genitourinary:  Negative for dysuria and frequency.  Musculoskeletal:  Positive for arthralgias. Negative for back pain, joint swelling and neck pain.  Skin:  Negative for rash.  Neurological:  Positive for numbness. Negative  for tremors.  Hematological:  Negative for adenopathy. Does not bruise/bleed easily.  Psychiatric/Behavioral:  Negative for behavioral problems (Depression), sleep disturbance and suicidal ideas. The patient is not nervous/anxious.     Vital Signs: BP 114/63   Pulse 63   Temp 98 F (36.7 C)   Resp 16   Ht '6\' 2"'$  (1.88 m)   Wt 298 lb (135.2 kg)   SpO2 97%   BMI 38.26 kg/m    Physical Exam Vitals and nursing note reviewed.  Constitutional:      General: He is not in acute distress.    Appearance: He is well-developed. He is obese. He is not diaphoretic.  HENT:     Head: Normocephalic and atraumatic.     Mouth/Throat:     Pharynx: No oropharyngeal exudate.  Eyes:     Pupils: Pupils are equal, round, and reactive to light.  Neck:     Thyroid: No thyromegaly.     Vascular: No JVD.     Trachea: No tracheal deviation.  Cardiovascular:     Rate and Rhythm: Normal rate and regular rhythm.     Heart sounds: Normal heart sounds. No murmur heard.    No friction rub. No gallop.  Pulmonary:     Effort: Pulmonary effort is normal. No respiratory distress.     Breath sounds: No wheezing or rales.  Chest:     Chest wall: No tenderness.  Abdominal:     General: Bowel sounds are normal.     Palpations: Abdomen is soft.  Musculoskeletal:        General: Normal range of motion.     Cervical back: Normal range of motion and neck supple.     Right lower leg: No edema.     Left lower leg: No edema.  Lymphadenopathy:     Cervical: No cervical adenopathy.  Skin:    General: Skin is warm and dry.  Neurological:     Mental Status: He is alert and oriented to person, place, and time.     Cranial Nerves: No cranial nerve deficit.  Psychiatric:        Behavior: Behavior normal.        Thought Content: Thought content normal.        Judgment: Judgment normal.        Assessment/Plan: 1. Type 2 diabetes mellitus with hyperglycemia, unspecified whether long term insulin use  ([place]) Almost due for  A1c and will have this checked with other blood work prior to CPE next visit - Hgb A1C w/o eAG  2. Essential hypertension Well controlled, advised he can just take lasix prn for swelling  3. Hepatic steatosis Followed by GI  4. Mixed hyperlipidemia - Lipid Panel With LDL/HDL Ratio  5. Abnormal thyroid exam - TSH + free T4  6. Other fatigue - Comprehensive metabolic panel - CBC w/Diff/Platelet - TSH + free T4 - Lipid Panel With LDL/HDL Ratio - Hgb A1C w/o eAG   General Counseling: Taven verbalizes understanding of the findings of todays visit and agrees with plan of treatment. I have discussed any further diagnostic evaluation that may be needed or ordered today. We also reviewed his medications today. he has been encouraged to call the office with any questions or concerns that should arise related to todays visit.    Orders Placed This Encounter  Procedures   Comprehensive metabolic panel   CBC w/Diff/Platelet   TSH + free T4   Lipid Panel With LDL/HDL Ratio   Hgb A1C w/o eAG    No orders of the defined types were placed in this encounter.   This patient was seen by Drema Dallas, PA-C in collaboration with Dr. Clayborn Bigness as a part of collaborative care agreement.   Total time spent:30 Minutes Time spent includes review of chart, medications, test results, and follow up plan with the patient.      Dr Lavera Guise Internal medicine

## 2021-07-11 ENCOUNTER — Other Ambulatory Visit: Payer: Self-pay | Admitting: Physician Assistant

## 2021-07-11 DIAGNOSIS — F411 Generalized anxiety disorder: Secondary | ICD-10-CM

## 2021-07-15 ENCOUNTER — Other Ambulatory Visit: Payer: Self-pay | Admitting: Physician Assistant

## 2021-07-15 ENCOUNTER — Encounter: Payer: Self-pay | Admitting: Nurse Practitioner

## 2021-07-15 ENCOUNTER — Ambulatory Visit: Payer: 59 | Admitting: Nurse Practitioner

## 2021-07-15 VITALS — BP 140/75 | HR 73 | Temp 97.9°F | Resp 16 | Ht 74.0 in | Wt 305.0 lb

## 2021-07-15 DIAGNOSIS — I10 Essential (primary) hypertension: Secondary | ICD-10-CM

## 2021-07-15 DIAGNOSIS — Z4802 Encounter for removal of sutures: Secondary | ICD-10-CM

## 2021-07-15 DIAGNOSIS — M7989 Other specified soft tissue disorders: Secondary | ICD-10-CM

## 2021-07-15 MED ORDER — MUPIROCIN 2 % EX OINT
1.0000 | TOPICAL_OINTMENT | Freq: Every day | CUTANEOUS | 0 refills | Status: DC
Start: 2021-07-15 — End: 2021-10-03

## 2021-07-15 NOTE — Progress Notes (Signed)
[facility] [place], [place] [address]  Internal MEDICINE  Office Visit Note  Patient Name: Gerald [name]  [id number]  [id number]  Date of Service: 07/15/2021  Chief Complaint  Patient presents with   Suture / Staple Removal    Right forearm suture removal    Subjective:  Gerald [name] is a 56 y.o. male who obtained a laceration 7 days ago, which required closure with 9 sutures. Mechanism of injury: metal storm door swung back toward him and slashed his arm when he was trying to throw it in a dumpster. He denies pain, redness, or drainage from the wound. His last tetanus was unknown and was not administered in the ED.  The following portions of the patient's history were reviewed and updated as appropriate: allergies, current medications, past family history, past medical history, past social history, past surgical history, and problem list.  Review of Systems A comprehensive review of systems was negative except for: Integument: positive for redness and tenderness of peri-wound area.    Objective: 7 sutures noted on examination of the laceration. Patient reported that 2 sutures came out when he was changing the bandage at home. There is erythema around the wound bed, minimal edema that could be signs of a developing infection. No purulent drainage noted. No bleeding or serous drainage noted.   BP 140/75   Pulse 73   Temp 97.9 F (36.6 C)   Resp 16   Ht '6\' 2"'$  (1.88 m)   Wt (!) 305 lb (138.3 kg)   SpO2 97%   BMI 39.16 kg/m   Injury exam:  A 6 cm laceration noted on the right lateral forearm is healing well, with evidence of infection.   Assessment: Laceration is healing well, with evidence of infection. Only slight erythema around the edge of the wound bed with slight swelling. Area was cleaned with sterile water and topical antibiotic ointment was applied as well as 4 steri-strips.    Plan: 1. 7 sutures were removed. Area cleansed with  sterile water. 4 steri-strips applied to encourage continued approximation of the wound edges while it continued to heal. Triple antibiotic ointment also applied.  2. Wound care discussed. Patient instructed to keep the wound clean and apply topical mupirocin ointment to the wound once daily until healed. Also instructed patient to leave the steri-strips on until they fall off. Extra steri-strips provided to patient in the event that the steri-strips come off too soon.  3. Follow up as needed. Instructed patient to call the clinic if he notices any worsening signs of infection so he can be seen to have the wound and/or possible infection assessed and treated appropriately. Routine follow up with Susa Simmonds, his PCP.        Current Medication: Outpatient Encounter Medications as of 07/15/2021  Medication Sig   acetaminophen (TYLENOL) 500 MG tablet Take 2 tablets (1,000 mg total) by mouth every 6 (six) hours as needed for mild pain.   amLODipine (NORVASC) 5 MG tablet Take 1 tablet (5 mg total) by mouth daily.   buPROPion (WELLBUTRIN XL) 150 MG 24 hr tablet TAKE 1 TABLET BY MOUTH EVERY DAY   citalopram (CELEXA) 40 MG tablet TAKE 1 TABLET BY MOUTH EVERY DAY FOR GAD   gabapentin (NEURONTIN) 100 MG capsule Take 2 capsules (200 mg total) by mouth 3 (three) times daily.   lisinopril-hydrochlorothiazide (ZESTORETIC) 20-25 MG tablet Take 1 tablet by mouth daily.   Multiple Vitamins-Minerals (MULTIVITAMIN ADULTS PO) Take  1 tablet by mouth daily.   mupirocin ointment (BACTROBAN) 2 % Apply 1 application  topically daily. To affected area until healed.   Na Sulfate-K Sulfate-Mg Sulf 17.5-3.13-1.6 GM/177ML SOLN    nitroGLYCERIN (NITROSTAT) 0.4 MG SL tablet Place 1 tablet (0.4 mg total) under the tongue every 5 (five) minutes x 3 doses as needed for chest pain.   oxyCODONE (OXY IR/ROXICODONE) 5 MG immediate release tablet Take 1 tablet (5 mg total) by mouth every 4 (four) hours as needed for severe  pain.   [DISCONTINUED] furosemide (LASIX) 20 MG tablet Take 1 tablet by mouth daily as needed for leg swelling   No facility-administered encounter medications on file as of 07/15/2021.    Surgical History: Past Surgical History:  Procedure Laterality Date   BONE EXCISION Right 11/21/2020   Procedure: PART EXCISION BONE- PHALANX;  Surgeon: Samara Deist, DPM;  Location: [place];  Service: Podiatry;  Laterality: Right;   CARDIAC CATHETERIZATION N/A 09/2011   [facility]; EF 50% with 30% mid LAD stenosis and no obstructive disease.   CARDIAC CATHETERIZATION  09/2010   [facility]; Mid LAD 40% stenosis; Mid Circumflex:Normal; Mid RCA; Normal   CARDIAC CATHETERIZATION     COLONOSCOPY     COLONOSCOPY WITH PROPOFOL N/A 04/10/2021   Procedure: COLONOSCOPY WITH PROPOFOL;  Surgeon: Jonathon Bellows, MD;  Location: [facility] ENDOSCOPY;  Service: Gastroenterology;  Laterality: N/A;   ESOPHAGOGASTRODUODENOSCOPY N/A 04/10/2021   Procedure: ESOPHAGOGASTRODUODENOSCOPY (EGD);  Surgeon: Jonathon Bellows, MD;  Location: [facility] ENDOSCOPY;  Service: Gastroenterology;  Laterality: N/A;   ESOPHAGOGASTRODUODENOSCOPY (EGD) WITH PROPOFOL N/A 06/10/2017   Procedure: ESOPHAGOGASTRODUODENOSCOPY (EGD) WITH PROPOFOL;  Surgeon: Jonathon Bellows, MD;  Location: [facility] ENDOSCOPY;  Service: Gastroenterology;  Laterality: N/A;   HAMMER TOE SURGERY Right 11/21/2020   Procedure: HAMMERTOE CORRECTION;  Surgeon: Samara Deist, DPM;  Location: [place];  Service: Podiatry;  Laterality: Right;   INSERTION OF MESH  04/16/2021   Procedure: INSERTION OF MESH;  Surgeon: Olean Ree, MD;  Location: [facility] ORS;  Service: General;;   KNEE ARTHROSCOPY WITH MEDIAL MENISECTOMY Right 09/16/2012   Procedure: RIGHT KNEE ARTHROSCOPY WITH MEDIAL AND LATERAL MENISECTOMY, CHONDROPLASTY;  Surgeon: Ninetta Lights, MD;  Location: [place];  Service: Orthopedics;  Laterality: Right;  RIGHT KNEE SCOPE MEDIAL MENISCECTOMY   LEFT HEART CATH AND CORONARY  ANGIOGRAPHY N/A 06/11/2016   Procedure: Left Heart Cath and Coronary Angiography;  Surgeon: Minna Merritts, MD;  Location: [place] CV LAB;  Service: Cardiovascular;  Laterality: N/A;   UMBILICAL HERNIA REPAIR N/A 04/16/2021   Procedure: HERNIA REPAIR UMBILICAL ADULT, open;  Surgeon: Olean Ree, MD;  Location: [facility] ORS;  Service: General;  Laterality: N/A;    Medical History: Past Medical History:  Diagnosis Date   Abnormal LFTs    Anxiety    Atypical chest pain    Borderline diabetes    CAD (coronary artery disease)    a.) PCI and stent placement (unknown type) to mLAD and mLCx; date unknown. b.) LHC 09/10/2010: 40% mLAD; no intervention. c.) LHC 09/12/2011: EF 60%; 30% ISR mLAD, 40% dLAD, 30% pLCx, 30% ISR mLCx, 40% mRCA; no intervention. d.) LHC 05/23/2014: EF >55%; 30% p-mLAD; no intervention. e.) LHC 06/11/2016: EF 55-65%; LVEDP norm; 30-40% p-mLAD; no intervention.   Carcinoma ([place])    Chronic pain syndrome    Depression    Fundic gland polyps of stomach, benign    Gastritis    GERD (gastroesophageal reflux disease)    Hepatic steatosis  Hyperlipidemia    Hypertension    Migraines    Myocardial infarction [facility]) 2008   was told by PCP   Nonischemic cardiomyopathy ([place])    a.) TTE 09/12/2011: EF 35-45%. b.) LHC 09/12/2011: EF 60%. c.) TTE 06/04/2012: EF 55-60%. d.) LHC 05/23/2014: EF >55%. e.) TTE 06/11/2016: EF 55-60%; G1DD. f.) LHC 06/11/2016: EF 55-65%. g.) TTE 01/08/2018: EF 60-65%.   Obesity    Occlusion and stenosis of bilateral carotid arteries    OSA on CPAP    Osteoarthritis of both knees    Shortness of breath    Squamous cell carcinoma of skin 02/27/2020   left distal lat deltoid - [facility]    Family History: Family History  Problem Relation Age of Onset   Heart disease Father 8       MI   Heart attack Father    Stomach cancer Father    Hypertension Mother    Breast cancer Mother     Social History   Socioeconomic History   Marital status:  Married    Spouse name: Not on file   Number of children: 3   Years of education: Not on file   Highest education level: Not on file  Occupational History   Occupation: Librarian, academic  Tobacco Use   Smoking status: Never   Smokeless tobacco: Former    Quit date: 1987  Scientific laboratory technician Use: Never used  Substance and Sexual Activity   Alcohol use: Not Currently   Drug use: No   Sexual activity: Not on file  Other Topics Concern   Not on file  Social History Narrative   Married.  25 yo son.   Wife on disability for bipolar disorder.     Social Determinants of Health   Financial Resource Strain: Not on file  Food Insecurity: Not on file  Transportation Needs: Not on file  Physical Activity: Not on file  Stress: Not on file  Social Connections: Not on file  Intimate Partner Violence: Not on file    General Counseling: yakub lodes understanding of the findings of todays visit and agrees with plan of treatment. I have discussed any further diagnostic evaluation that may be needed or ordered today. We also reviewed his medications today. he has been encouraged to call the office with any questions or concerns that should arise related to todays visit.  Assessment and plan: 1. Encounter for removal of sutures Please see details above regarding laceration, procedure details of suture removal and after-care information.   - Suture removal   Orders Placed This Encounter  Procedures   Suture removal    Meds ordered this encounter  Medications   mupirocin ointment (BACTROBAN) 2 %    Sig: Apply 1 application  topically daily. To affected area until healed.    Dispense:  30 g    Refill:  0    Return if symptoms worsen or fail to improve.   Total time spent:20 Minutes Time spent includes review of chart, medications, test results, and follow up plan with the patient.   [place] Controlled Substance Database was reviewed by me.  This patient was seen by Jonetta Osgood, FNP-C in collaboration with Dr. Clayborn Bigness as a part of collaborative care agreement.   Favian Kittleson R. Valetta Fuller, MSN, FNP-C Internal medicine

## 2021-07-16 ENCOUNTER — Encounter: Payer: Self-pay | Admitting: Nurse Practitioner

## 2021-08-05 ENCOUNTER — Encounter: Payer: 59 | Admitting: Physician Assistant

## 2021-08-08 ENCOUNTER — Other Ambulatory Visit: Payer: Self-pay | Admitting: Physician Assistant

## 2021-08-16 ENCOUNTER — Encounter: Payer: Self-pay | Admitting: Physician Assistant

## 2021-08-16 ENCOUNTER — Telehealth (INDEPENDENT_AMBULATORY_CARE_PROVIDER_SITE_OTHER): Payer: 59 | Admitting: Physician Assistant

## 2021-08-16 VITALS — BP 134/66 | HR 62 | Resp 16 | Ht 74.0 in | Wt 290.0 lb

## 2021-08-16 DIAGNOSIS — L02419 Cutaneous abscess of limb, unspecified: Secondary | ICD-10-CM

## 2021-08-16 DIAGNOSIS — L03119 Cellulitis of unspecified part of limb: Secondary | ICD-10-CM | POA: Diagnosis not present

## 2021-08-16 MED ORDER — DOXYCYCLINE HYCLATE 100 MG PO TABS: 100.0000 mg | ORAL_TABLET | Freq: Two times a day (BID) | ORAL | 0 refills | Status: DC

## 2021-08-16 NOTE — Progress Notes (Signed)
[facility] [place], [place] [address]  Internal MEDICINE  Telephone Visit  Patient Name: Gerald [name]  [id number]  [id number]  Date of Service: 08/16/2021  I connected with the patient at 12:16 by telephone and verified the patients identity using two identifiers.   I discussed the limitations, risks, security and privacy concerns of performing an evaluation and management service by telephone and the availability of in person appointments. I also discussed with the patient that there may be a patient responsible charge related to the service.  The patient expressed understanding and agrees to proceed.    Chief Complaint  Patient presents with   Acute Visit    Cyct on right arm and its been bothering pt for about a week, red, painful, inflamed, hot to the touch    Telephone Assessment    video   Telephone Screen    [phone] lisa his wife's number     HPI Pt is here for virtual sick visit -He has a cyst/bump on his right arm that has been bothering him over the last week. -It is red, painful, and warm to the touch -he reports it started as marble-size raised area, but then he fell last week and did hit the area where marble size bump was and after that has noticed is getting larger, but that it was already present prior to fall. Reports he was carrying a tub and got tripped up over dolly. Did not hit head. Knee has a cut and used to be swollen, but has improved and then bruise on chest. -He states the redness of the affected area is about 2in wide and is "squishy" to the touch. -It is medial and proximal to right elbow and doesn't appear to involve elbow joint -Discussed monitoring for fever, any expansion of the redness/affected area, or red streaking, and to go to ED if any of these arise or if overall worsening symptoms as it may need to be drained or treated with IV ABX. He expressed understanding  Current Medication: Outpatient Encounter  Medications as of 08/16/2021  Medication Sig   acetaminophen (TYLENOL) 500 MG tablet Take 2 tablets (1,000 mg total) by mouth every 6 (six) hours as needed for mild pain.   amLODipine (NORVASC) 5 MG tablet TAKE 1 TABLET (5 MG TOTAL) BY MOUTH DAILY.   buPROPion (WELLBUTRIN XL) 150 MG 24 hr tablet TAKE 1 TABLET BY MOUTH EVERY DAY   citalopram (CELEXA) 40 MG tablet TAKE 1 TABLET BY MOUTH EVERY DAY FOR GAD   doxycycline (VIBRA-TABS) 100 MG tablet Take 1 tablet (100 mg total) by mouth 2 (two) times daily.   furosemide (LASIX) 20 MG tablet TAKE 1 TABLET BY MOUTH EVERY DAY AS NEEDED FOR LEG SWELLING   gabapentin (NEURONTIN) 100 MG capsule Take 2 capsules (200 mg total) by mouth 3 (three) times daily.   lisinopril-hydrochlorothiazide (ZESTORETIC) 20-25 MG tablet Take 1 tablet by mouth daily.   Multiple Vitamins-Minerals (MULTIVITAMIN ADULTS PO) Take 1 tablet by mouth daily.   mupirocin ointment (BACTROBAN) 2 % Apply 1 application  topically daily. To affected area until healed.   Na Sulfate-K Sulfate-Mg Sulf 17.5-3.13-1.6 GM/177ML SOLN    nitroGLYCERIN (NITROSTAT) 0.4 MG SL tablet Place 1 tablet (0.4 mg total) under the tongue every 5 (five) minutes x 3 doses as needed for chest pain.   oxyCODONE (OXY IR/ROXICODONE) 5 MG immediate release tablet Take 1 tablet (5 mg total) by mouth every 4 (four) hours as needed for severe  pain.   No facility-administered encounter medications on file as of 08/16/2021.    Surgical History: Past Surgical History:  Procedure Laterality Date   BONE EXCISION Right 11/21/2020   Procedure: PART EXCISION BONE- PHALANX;  Surgeon: Samara Deist, DPM;  Location: [place];  Service: Podiatry;  Laterality: Right;   CARDIAC CATHETERIZATION N/A 09/2011   [facility]; EF 50% with 30% mid LAD stenosis and no obstructive disease.   CARDIAC CATHETERIZATION  09/2010   [facility]; Mid LAD 40% stenosis; Mid Circumflex:Normal; Mid RCA; Normal   CARDIAC CATHETERIZATION     COLONOSCOPY      COLONOSCOPY WITH PROPOFOL N/A 04/10/2021   Procedure: COLONOSCOPY WITH PROPOFOL;  Surgeon: Jonathon Bellows, MD;  Location: [facility] ENDOSCOPY;  Service: Gastroenterology;  Laterality: N/A;   ESOPHAGOGASTRODUODENOSCOPY N/A 04/10/2021   Procedure: ESOPHAGOGASTRODUODENOSCOPY (EGD);  Surgeon: Jonathon Bellows, MD;  Location: [facility] ENDOSCOPY;  Service: Gastroenterology;  Laterality: N/A;   ESOPHAGOGASTRODUODENOSCOPY (EGD) WITH PROPOFOL N/A 06/10/2017   Procedure: ESOPHAGOGASTRODUODENOSCOPY (EGD) WITH PROPOFOL;  Surgeon: Jonathon Bellows, MD;  Location: [facility] ENDOSCOPY;  Service: Gastroenterology;  Laterality: N/A;   HAMMER TOE SURGERY Right 11/21/2020   Procedure: HAMMERTOE CORRECTION;  Surgeon: Samara Deist, DPM;  Location: [place];  Service: Podiatry;  Laterality: Right;   INSERTION OF MESH  04/16/2021   Procedure: INSERTION OF MESH;  Surgeon: Olean Ree, MD;  Location: [facility] ORS;  Service: General;;   KNEE ARTHROSCOPY WITH MEDIAL MENISECTOMY Right 09/16/2012   Procedure: RIGHT KNEE ARTHROSCOPY WITH MEDIAL AND LATERAL MENISECTOMY, CHONDROPLASTY;  Surgeon: Ninetta Lights, MD;  Location: [place];  Service: Orthopedics;  Laterality: Right;  RIGHT KNEE SCOPE MEDIAL MENISCECTOMY   LEFT HEART CATH AND CORONARY ANGIOGRAPHY N/A 06/11/2016   Procedure: Left Heart Cath and Coronary Angiography;  Surgeon: Minna Merritts, MD;  Location: [place] CV LAB;  Service: Cardiovascular;  Laterality: N/A;   UMBILICAL HERNIA REPAIR N/A 04/16/2021   Procedure: HERNIA REPAIR UMBILICAL ADULT, open;  Surgeon: Olean Ree, MD;  Location: [facility] ORS;  Service: General;  Laterality: N/A;    Medical History: Past Medical History:  Diagnosis Date   Abnormal LFTs    Anxiety    Atypical chest pain    Borderline diabetes    CAD (coronary artery disease)    a.) PCI and stent placement (unknown type) to mLAD and mLCx; date unknown. b.) LHC 09/10/2010: 40% mLAD; no intervention. c.) LHC 09/12/2011: EF 60%; 30% ISR  mLAD, 40% dLAD, 30% pLCx, 30% ISR mLCx, 40% mRCA; no intervention. d.) LHC 05/23/2014: EF >55%; 30% p-mLAD; no intervention. e.) LHC 06/11/2016: EF 55-65%; LVEDP norm; 30-40% p-mLAD; no intervention.   Carcinoma ([place])    Chronic pain syndrome    Depression    Fundic gland polyps of stomach, benign    Gastritis    GERD (gastroesophageal reflux disease)    Hepatic steatosis    Hyperlipidemia    Hypertension    Migraines    Myocardial infarction [facility]) 2008   was told by PCP   Nonischemic cardiomyopathy ([place])    a.) TTE 09/12/2011: EF 35-45%. b.) LHC 09/12/2011: EF 60%. c.) TTE 06/04/2012: EF 55-60%. d.) LHC 05/23/2014: EF >55%. e.) TTE 06/11/2016: EF 55-60%; G1DD. f.) LHC 06/11/2016: EF 55-65%. g.) TTE 01/08/2018: EF 60-65%.   Obesity    Occlusion and stenosis of bilateral carotid arteries    OSA on CPAP    Osteoarthritis of both knees    Shortness of breath    Squamous cell carcinoma of skin 02/27/2020  left distal lat deltoid - [facility]    Family History: Family History  Problem Relation Age of Onset   Heart disease Father 70       MI   Heart attack Father    Stomach cancer Father    Hypertension Mother    Breast cancer Mother     Social History   Socioeconomic History   Marital status: Married    Spouse name: Not on file   Number of children: 3   Years of education: Not on file   Highest education level: Not on file  Occupational History   Occupation: Librarian, academic  Tobacco Use   Smoking status: Never   Smokeless tobacco: Former    Quit date: 1987  Scientific laboratory technician Use: Never used  Substance and Sexual Activity   Alcohol use: Not Currently   Drug use: No   Sexual activity: Not on file  Other Topics Concern   Not on file  Social History Narrative   Married.  83 yo son.   Wife on disability for bipolar disorder.     Social Determinants of Health   Financial Resource Strain: Not on file  Food Insecurity: Not on file  Transportation Needs: Not on file   Physical Activity: Not on file  Stress: Not on file  Social Connections: Not on file  Intimate Partner Violence: Not on file      Review of Systems  Constitutional:  Negative for chills, fatigue and fever.  HENT:  Negative for congestion, mouth sores and postnasal drip.   Respiratory:  Negative for cough.   Cardiovascular:  Negative for chest pain.  Genitourinary:  Negative for flank pain.  Skin:  Positive for color change.       2in wide red raised area that is squishy but not draining  Psychiatric/Behavioral: Negative.      Vital Signs: BP 134/66   Pulse 62   Resp 16   Ht '6\' 2"'$  (1.88 m)   Wt 290 lb (131.5 kg)   BMI 37.23 kg/m    Observation/Objective:  Pt is able to carry out conversation. ~2in of erythema seen medial and proximal to right elbow, not overlying elbow joint and ROM intact, limited visualization though due to being over video.   Assessment/Plan: 1. Cellulitis and abscess of upper extremity Will start on Doxycycline BID for 1 week and has follow up already scheduled in office next week. May use warm compress over area to see if it may drain on its own while treating with oral ABX. Advised to go to ED if any worsening and to monitor for fever/chills, expansion of erythema, or any red streaks up arm. Pt may need to have area drained or need IV ABX if not improving. - doxycycline (VIBRA-TABS) 100 MG tablet; Take 1 tablet (100 mg total) by mouth 2 (two) times daily.  Dispense: 14 tablet; Refill: 0   General Counseling: Rett verbalizes understanding of the findings of today's phone visit and agrees with plan of treatment. I have discussed any further diagnostic evaluation that may be needed or ordered today. We also reviewed his medications today. he has been encouraged to call the office with any questions or concerns that should arise related to todays visit.    No orders of the defined types were placed in this encounter.   Meds ordered this encounter   Medications   doxycycline (VIBRA-TABS) 100 MG tablet    Sig: Take 1 tablet (100 mg total) by mouth 2 (  two) times daily.    Dispense:  14 tablet    Refill:  0    Time spent:30 Minutes    Dr Lavera Guise Internal medicine

## 2021-08-19 ENCOUNTER — Telehealth: Payer: Self-pay

## 2021-08-19 NOTE — Telephone Encounter (Signed)
Left vm to confirm 08/22/21 appointment-Toni

## 2021-08-22 ENCOUNTER — Telehealth: Payer: Self-pay

## 2021-08-22 ENCOUNTER — Ambulatory Visit (INDEPENDENT_AMBULATORY_CARE_PROVIDER_SITE_OTHER): Payer: 59 | Admitting: Physician Assistant

## 2021-08-22 ENCOUNTER — Encounter: Payer: Self-pay | Admitting: Physician Assistant

## 2021-08-22 VITALS — BP 115/63 | HR 67 | Temp 97.8°F | Resp 16 | Ht 74.0 in | Wt 299.0 lb

## 2021-08-22 DIAGNOSIS — E1165 Type 2 diabetes mellitus with hyperglycemia: Secondary | ICD-10-CM | POA: Diagnosis not present

## 2021-08-22 DIAGNOSIS — G8929 Other chronic pain: Secondary | ICD-10-CM

## 2021-08-22 DIAGNOSIS — M79671 Pain in right foot: Secondary | ICD-10-CM

## 2021-08-22 DIAGNOSIS — L03119 Cellulitis of unspecified part of limb: Secondary | ICD-10-CM | POA: Diagnosis not present

## 2021-08-22 DIAGNOSIS — Z0001 Encounter for general adult medical examination with abnormal findings: Secondary | ICD-10-CM

## 2021-08-22 DIAGNOSIS — I10 Essential (primary) hypertension: Secondary | ICD-10-CM

## 2021-08-22 DIAGNOSIS — F411 Generalized anxiety disorder: Secondary | ICD-10-CM | POA: Diagnosis not present

## 2021-08-22 DIAGNOSIS — R5383 Other fatigue: Secondary | ICD-10-CM

## 2021-08-22 DIAGNOSIS — M25561 Pain in right knee: Secondary | ICD-10-CM

## 2021-08-22 DIAGNOSIS — M79672 Pain in left foot: Secondary | ICD-10-CM

## 2021-08-22 DIAGNOSIS — G4733 Obstructive sleep apnea (adult) (pediatric): Secondary | ICD-10-CM

## 2021-08-22 DIAGNOSIS — R300 Dysuria: Secondary | ICD-10-CM

## 2021-08-22 DIAGNOSIS — L02419 Cutaneous abscess of limb, unspecified: Secondary | ICD-10-CM

## 2021-08-22 MED ORDER — DOXYCYCLINE HYCLATE 100 MG PO TABS: 100.0000 mg | ORAL_TABLET | Freq: Two times a day (BID) | ORAL | 0 refills | Status: DC

## 2021-08-22 MED ORDER — BUSPIRONE HCL 5 MG PO TABS: 5.0000 mg | ORAL_TABLET | Freq: Two times a day (BID) | ORAL | 2 refills | Status: DC

## 2021-08-22 NOTE — Progress Notes (Signed)
[facility] [place],  [address]  Internal MEDICINE  Office Visit Note  Patient Name: Gerald [name]  [id number]  [id number]  Date of Service: 08/22/2021  Chief Complaint  Patient presents with   Depression   Hyperlipidemia   Hypertension   Annual Exam   Foot Pain    Pain and swelling in both feet - has been ongoing for years   Fatigue    Constant fatigue   Elbow Problem    Has a knot on right elbow, has gotten bigger/spreading   Quality Metric Gaps    Shingles Vaccine + Foot Exam     HPI Pt is here for routine health maintenance examination -He is fatigued and has been for awhile. He works night shift and is on an adjusted sleep schedule and therefore would normally be asleep right now -He has been on CPAP for 8-9years on the same machine and reports no data checks, used to get supplies was [employer], but now isnt sure and hasnt gotten supplies for awhile because he had extra. He would benefit from an updated titration and new machine pending those results to ensure his apnea is actually being controlled.  -feels off balance and had had a few falls from missed steps. He had a fall the other week and hit his knee and initially swelled, but has gone down. He already had chronic pain in his right knee and has had it scoped before and wonders if he needs a replacement. Will swell some with activity. He also has chronic foot pain. No burning and sensation in tact therefore unclear cause of pain. Would like to see ortho about knee and may also discuss feet. -Some increased anxiety that comes in waves. Is on celexa and wellbutrin, but still has some anxiety, but doesn't want something that will increase his fatigue. Discussed adding buspar -still eating 1500-1800 calories per day, weight up and down -Still needs to get labs done -Does still have small area of erythema near right elbow and has one day of Doxy left. If has gone down in size, but still  present therefore will extend ABX and advised to continue warm compresses  Current Medication: Outpatient Encounter Medications as of 08/22/2021  Medication Sig   acetaminophen (TYLENOL) 500 MG tablet Take 2 tablets (1,000 mg total) by mouth every 6 (six) hours as needed for mild pain.   amLODipine (NORVASC) 5 MG tablet TAKE 1 TABLET (5 MG TOTAL) BY MOUTH DAILY.   buPROPion (WELLBUTRIN XL) 150 MG 24 hr tablet TAKE 1 TABLET BY MOUTH EVERY DAY   busPIRone (BUSPAR) 5 MG tablet Take 1 tablet (5 mg total) by mouth 2 (two) times daily.   citalopram (CELEXA) 40 MG tablet TAKE 1 TABLET BY MOUTH EVERY DAY FOR GAD   doxycycline (VIBRA-TABS) 100 MG tablet Take 1 tablet (100 mg total) by mouth 2 (two) times daily.   furosemide (LASIX) 20 MG tablet TAKE 1 TABLET BY MOUTH EVERY DAY AS NEEDED FOR LEG SWELLING   gabapentin (NEURONTIN) 100 MG capsule Take 2 capsules (200 mg total) by mouth 3 (three) times daily.   lisinopril-hydrochlorothiazide (ZESTORETIC) 20-25 MG tablet Take 1 tablet by mouth daily.   Multiple Vitamins-Minerals (MULTIVITAMIN ADULTS PO) Take 1 tablet by mouth daily.   mupirocin ointment (BACTROBAN) 2 % Apply 1 application  topically daily. To affected area until healed.   Na Sulfate-K Sulfate-Mg Sulf 17.5-3.13-1.6 GM/177ML SOLN    nitroGLYCERIN (NITROSTAT) 0.4 MG SL tablet Place 1 tablet (  0.4 mg total) under the tongue every 5 (five) minutes x 3 doses as needed for chest pain.   oxyCODONE (OXY IR/ROXICODONE) 5 MG immediate release tablet Take 1 tablet (5 mg total) by mouth every 4 (four) hours as needed for severe pain.   [DISCONTINUED] doxycycline (VIBRA-TABS) 100 MG tablet Take 1 tablet (100 mg total) by mouth 2 (two) times daily.   No facility-administered encounter medications on file as of 08/22/2021.    Surgical History: Past Surgical History:  Procedure Laterality Date   BONE EXCISION Right 11/21/2020   Procedure: PART EXCISION BONE- PHALANX;  Surgeon: Samara Deist, DPM;   Location: [place];  Service: Podiatry;  Laterality: Right;   CARDIAC CATHETERIZATION N/A 09/2011   [facility]; EF 50% with 30% mid LAD stenosis and no obstructive disease.   CARDIAC CATHETERIZATION  09/2010   [facility]; Mid LAD 40% stenosis; Mid Circumflex:Normal; Mid RCA; Normal   CARDIAC CATHETERIZATION     COLONOSCOPY     COLONOSCOPY WITH PROPOFOL N/A 04/10/2021   Procedure: COLONOSCOPY WITH PROPOFOL;  Surgeon: Jonathon Bellows, MD;  Location: [facility] ENDOSCOPY;  Service: Gastroenterology;  Laterality: N/A;   ESOPHAGOGASTRODUODENOSCOPY N/A 04/10/2021   Procedure: ESOPHAGOGASTRODUODENOSCOPY (EGD);  Surgeon: Jonathon Bellows, MD;  Location: [facility] ENDOSCOPY;  Service: Gastroenterology;  Laterality: N/A;   ESOPHAGOGASTRODUODENOSCOPY (EGD) WITH PROPOFOL N/A 06/10/2017   Procedure: ESOPHAGOGASTRODUODENOSCOPY (EGD) WITH PROPOFOL;  Surgeon: Jonathon Bellows, MD;  Location: [facility] ENDOSCOPY;  Service: Gastroenterology;  Laterality: N/A;   HAMMER TOE SURGERY Right 11/21/2020   Procedure: HAMMERTOE CORRECTION;  Surgeon: Samara Deist, DPM;  Location: [place];  Service: Podiatry;  Laterality: Right;   INSERTION OF MESH  04/16/2021   Procedure: INSERTION OF MESH;  Surgeon: Olean Ree, MD;  Location: [facility] ORS;  Service: General;;   KNEE ARTHROSCOPY WITH MEDIAL MENISECTOMY Right 09/16/2012   Procedure: RIGHT KNEE ARTHROSCOPY WITH MEDIAL AND LATERAL MENISECTOMY, CHONDROPLASTY;  Surgeon: Ninetta Lights, MD;  Location: [place];  Service: Orthopedics;  Laterality: Right;  RIGHT KNEE SCOPE MEDIAL MENISCECTOMY   LEFT HEART CATH AND CORONARY ANGIOGRAPHY N/A 06/11/2016   Procedure: Left Heart Cath and Coronary Angiography;  Surgeon: Minna Merritts, MD;  Location: [place] CV LAB;  Service: Cardiovascular;  Laterality: N/A;   UMBILICAL HERNIA REPAIR N/A 04/16/2021   Procedure: HERNIA REPAIR UMBILICAL ADULT, open;  Surgeon: Olean Ree, MD;  Location: [facility] ORS;  Service: General;  Laterality: N/A;     Medical History: Past Medical History:  Diagnosis Date   Abnormal LFTs    Anxiety    Atypical chest pain    Borderline diabetes    CAD (coronary artery disease)    a.) PCI and stent placement (unknown type) to mLAD and mLCx; date unknown. b.) LHC 09/10/2010: 40% mLAD; no intervention. c.) LHC 09/12/2011: EF 60%; 30% ISR mLAD, 40% dLAD, 30% pLCx, 30% ISR mLCx, 40% mRCA; no intervention. d.) LHC 05/23/2014: EF >55%; 30% p-mLAD; no intervention. e.) LHC 06/11/2016: EF 55-65%; LVEDP norm; 30-40% p-mLAD; no intervention.   Carcinoma ([place])    Chronic pain syndrome    Depression    Fundic gland polyps of stomach, benign    Gastritis    GERD (gastroesophageal reflux disease)    Hepatic steatosis    Hyperlipidemia    Hypertension    Migraines    Myocardial infarction [facility]) 2008   was told by PCP   Nonischemic cardiomyopathy ([place])    a.) TTE 09/12/2011: EF 35-45%. b.) LHC 09/12/2011: EF 60%. c.) TTE 06/04/2012:  EF 55-60%. d.) LHC 05/23/2014: EF >55%. e.) TTE 06/11/2016: EF 55-60%; G1DD. f.) LHC 06/11/2016: EF 55-65%. g.) TTE 01/08/2018: EF 60-65%.   Obesity    Occlusion and stenosis of bilateral carotid arteries    OSA on CPAP    Osteoarthritis of both knees    Shortness of breath    Squamous cell carcinoma of skin 02/27/2020   left distal lat deltoid - [facility]    Family History: Family History  Problem Relation Age of Onset   Heart disease Father 71       MI   Heart attack Father    Stomach cancer Father    Hypertension Mother    Breast cancer Mother       Review of Systems  Constitutional:  Positive for fatigue. Negative for chills and unexpected weight change.  HENT:  Negative for congestion, postnasal drip, rhinorrhea, sneezing and sore throat.   Eyes:  Negative for redness.  Respiratory:  Negative for cough, chest tightness and shortness of breath.   Cardiovascular:  Negative for chest pain and palpitations.  Gastrointestinal:  Negative for abdominal pain,  constipation, diarrhea, nausea and vomiting.  Genitourinary:  Negative for dysuria and frequency.  Musculoskeletal:  Positive for arthralgias. Negative for back pain, joint swelling and neck pain.  Skin:  Positive for color change.  Neurological:  Negative for tremors.  Hematological:  Negative for adenopathy. Does not bruise/bleed easily.  Psychiatric/Behavioral:  Positive for sleep disturbance. Negative for behavioral problems (Depression) and suicidal ideas. The patient is nervous/anxious.      Vital Signs: BP 115/63   Pulse 67   Temp 97.8 F (36.6 C)   Resp 16   Ht '6\' 2"'$  (1.88 m)   Wt 299 lb (135.6 kg)   SpO2 97%   BMI 38.39 kg/m    Physical Exam Vitals and nursing note reviewed.  Constitutional:      General: He is not in acute distress.    Appearance: He is well-developed. He is obese. He is not diaphoretic.  HENT:     Head: Normocephalic and atraumatic.     Mouth/Throat:     Pharynx: No oropharyngeal exudate.  Eyes:     Pupils: Pupils are equal, round, and reactive to light.  Neck:     Thyroid: No thyromegaly.     Vascular: No JVD.     Trachea: No tracheal deviation.  Cardiovascular:     Rate and Rhythm: Normal rate and regular rhythm.     Heart sounds: Normal heart sounds. No murmur heard.    No friction rub. No gallop.  Pulmonary:     Effort: Pulmonary effort is normal. No respiratory distress.     Breath sounds: No wheezing or rales.  Chest:     Chest wall: No tenderness.  Abdominal:     General: Bowel sounds are normal.     Palpations: Abdomen is soft.     Tenderness: There is no abdominal tenderness.  Musculoskeletal:        General: Normal range of motion.     Cervical back: Normal range of motion and neck supple.     Right lower leg: No edema.     Left lower leg: No edema.  Lymphadenopathy:     Cervical: No cervical adenopathy.  Skin:    General: Skin is warm and dry.     Findings: Erythema present.     Comments: 1.5cm area of erythema  adjacent to elbow with slight center raised, no evidence of draining  Neurological:     Mental Status: He is alert and oriented to person, place, and time.     Cranial Nerves: No cranial nerve deficit.  Psychiatric:        Behavior: Behavior normal.        Thought Content: Thought content normal.        Judgment: Judgment normal.      LABS: No results found for this or any previous visit (from the past 2160 hour(s)).      Assessment/Plan: 1. Encounter for general adult medical examination with abnormal findings CPE performed, UTD on colonoscopy, eye exam scheduled in 1 month, will go for labs  2. Essential hypertension Stable, continue current medications  3. Type 2 diabetes mellitus with hyperglycemia, unspecified whether long term insulin use (HCC) A1c will be updated with other blood work, continue to work on diet and exercise  4. Cellulitis and abscess of upper extremity Improving, will extend course of ABX and continue to use warm compresses. Contact office if worsening  - doxycycline (VIBRA-TABS) 100 MG tablet; Take 1 tablet (100 mg total) by mouth 2 (two) times daily.  Dispense: 14 tablet; Refill: 0  5. Generalized anxiety disorder Will add buspar BID to help with anxiety and will follow up in 1 month - busPIRone (BUSPAR) 5 MG tablet; Take 1 tablet (5 mg total) by mouth 2 (two) times daily.  Dispense: 60 tablet; Refill: 2  6. Chronic pain of right knee Will refer to ortho - AMB referral to orthopedics  7. Pain in both feet - AMB referral to orthopedics  8. OSA (obstructive sleep apnea) Will order titration to determine best settings and move forward with ordering new machine to ensure apnea is being controlled given fatigue - Cpap titration; Future  9. Other fatigue - Cpap titration; Future  10. Dysuria - UA/M w/rflx Culture, Routine   General Counseling: Cullen verbalizes understanding of the findings of todays visit and agrees with plan of treatment. I  have discussed any further diagnostic evaluation that may be needed or ordered today. We also reviewed his medications today. he has been encouraged to call the office with any questions or concerns that should arise related to todays visit.    Counseling:    Orders Placed This Encounter  Procedures   UA/M w/rflx Culture, Routine   AMB referral to orthopedics   Cpap titration    Meds ordered this encounter  Medications   doxycycline (VIBRA-TABS) 100 MG tablet    Sig: Take 1 tablet (100 mg total) by mouth 2 (two) times daily.    Dispense:  14 tablet    Refill:  0   busPIRone (BUSPAR) 5 MG tablet    Sig: Take 1 tablet (5 mg total) by mouth 2 (two) times daily.    Dispense:  60 tablet    Refill:  2    This patient was seen by Drema Dallas, PA-C in collaboration with Dr. Clayborn Bigness as a part of collaborative care agreement.  Total time spent:40 Minutes  Time spent includes review of chart, medications, test results, and follow up plan with the patient.     Lavera Guise, MD  Internal Medicine

## 2021-08-22 NOTE — Telephone Encounter (Signed)
Cpap titration order placed in [facility] folder-Toni

## 2021-08-22 NOTE — Telephone Encounter (Signed)
Awaiting 08/22/21 office notes for Cpap titration-Toni

## 2021-08-23 LAB — UA/M W/RFLX CULTURE, ROUTINE
Bilirubin, UA: NEGATIVE
Glucose, UA: NEGATIVE
Ketones, UA: NEGATIVE
Leukocytes,UA: NEGATIVE
Nitrite, UA: NEGATIVE
Protein,UA: NEGATIVE
RBC, UA: NEGATIVE
Specific Gravity, UA: 1.018 (ref 1.005–1.030)
Urobilinogen, Ur: 1 mg/dL (ref 0.2–1.0)
pH, UA: 5.5 (ref 5.0–7.5)

## 2021-08-23 LAB — MICROSCOPIC EXAMINATION
Bacteria, UA: NONE SEEN
Casts: NONE SEEN /lpf
RBC, Urine: NONE SEEN /hpf (ref 0–2)
WBC, UA: NONE SEEN /hpf (ref 0–5)

## 2021-08-29 ENCOUNTER — Telehealth: Payer: Self-pay

## 2021-08-29 NOTE — Telephone Encounter (Signed)
Orthopedic referral sent via [facility] to [facility]

## 2021-09-11 LAB — TSH+FREE T4
Free T4: 1.13 ng/dL (ref 0.82–1.77)
TSH: 3.92 u[IU]/mL (ref 0.450–4.500)

## 2021-09-11 LAB — COMPREHENSIVE METABOLIC PANEL
ALT: 26 IU/L (ref 0–44)
AST: 16 IU/L (ref 0–40)
Albumin/Globulin Ratio: 1.6 (ref 1.2–2.2)
Albumin: 3.9 g/dL (ref 3.8–4.9)
Alkaline Phosphatase: 109 IU/L (ref 44–121)
BUN/Creatinine Ratio: 23 — ABNORMAL HIGH (ref 9–20)
BUN: 20 mg/dL (ref 6–24)
Bilirubin Total: 0.2 mg/dL (ref 0.0–1.2)
CO2: 23 mmol/L (ref 20–29)
Calcium: 9 mg/dL (ref 8.7–10.2)
Chloride: 104 mmol/L (ref 96–106)
Creatinine, Ser: 0.88 mg/dL (ref 0.76–1.27)
Globulin, Total: 2.5 g/dL (ref 1.5–4.5)
Glucose: 148 mg/dL — ABNORMAL HIGH (ref 70–99)
Potassium: 4.3 mmol/L (ref 3.5–5.2)
Sodium: 143 mmol/L (ref 134–144)
Total Protein: 6.4 g/dL (ref 6.0–8.5)
eGFR: 102 mL/min/{1.73_m2} (ref >59)

## 2021-09-11 LAB — CBC WITH DIFFERENTIAL/PLATELET
Basophils Absolute: 0.1 10*3/uL (ref 0.0–0.2)
Basos: 1 %
EOS (ABSOLUTE): 0.1 10*3/uL (ref 0.0–0.4)
Eos: 2 %
Hematocrit: 40.4 % (ref 37.5–51.0)
Hemoglobin: 13.6 g/dL (ref 13.0–17.7)
Immature Grans (Abs): 0.1 10*3/uL (ref 0.0–0.1)
Immature Granulocytes: 1 %
Lymphocytes Absolute: 3.1 10*3/uL (ref 0.7–3.1)
Lymphs: 39 %
MCH: 29.4 pg (ref 26.6–33.0)
MCHC: 33.7 g/dL (ref 31.5–35.7)
MCV: 87 fL (ref 79–97)
Monocytes Absolute: 0.6 10*3/uL (ref 0.1–0.9)
Monocytes: 7 %
Neutrophils Absolute: 4.1 10*3/uL (ref 1.4–7.0)
Neutrophils: 50 %
Platelets: 239 10*3/uL (ref 150–450)
RBC: 4.62 x10E6/uL (ref 4.14–5.80)
RDW: 13.4 % (ref 11.6–15.4)
WBC: 8 10*3/uL (ref 3.4–10.8)

## 2021-09-11 LAB — LIPID PANEL WITH LDL/HDL RATIO
Cholesterol, Total: 194 mg/dL (ref 100–199)
HDL: 35 mg/dL — ABNORMAL LOW (ref >39)
LDL Chol Calc (NIH): 102 mg/dL — ABNORMAL HIGH (ref 0–99)
LDL/HDL Ratio: 2.9 ratio (ref 0.0–3.6)
Triglycerides: 334 mg/dL — ABNORMAL HIGH (ref 0–149)
VLDL Cholesterol Cal: 57 mg/dL — ABNORMAL HIGH (ref 5–40)

## 2021-09-11 LAB — HGB A1C W/O EAG: Hgb A1c MFr Bld: 6.5 % — ABNORMAL HIGH (ref 4.8–5.6)

## 2021-09-16 ENCOUNTER — Other Ambulatory Visit: Payer: Self-pay | Admitting: Physician Assistant

## 2021-09-16 DIAGNOSIS — F411 Generalized anxiety disorder: Secondary | ICD-10-CM

## 2021-09-19 ENCOUNTER — Ambulatory Visit (INDEPENDENT_AMBULATORY_CARE_PROVIDER_SITE_OTHER): Payer: 59 | Admitting: Physician Assistant

## 2021-09-19 ENCOUNTER — Encounter: Payer: Self-pay | Admitting: Physician Assistant

## 2021-09-19 VITALS — BP 135/71 | HR 72 | Temp 97.8°F | Resp 16 | Ht 74.0 in | Wt 308.0 lb

## 2021-09-19 DIAGNOSIS — F411 Generalized anxiety disorder: Secondary | ICD-10-CM | POA: Diagnosis not present

## 2021-09-19 DIAGNOSIS — E1165 Type 2 diabetes mellitus with hyperglycemia: Secondary | ICD-10-CM | POA: Diagnosis not present

## 2021-09-19 DIAGNOSIS — M7989 Other specified soft tissue disorders: Secondary | ICD-10-CM

## 2021-09-19 DIAGNOSIS — G4733 Obstructive sleep apnea (adult) (pediatric): Secondary | ICD-10-CM

## 2021-09-19 DIAGNOSIS — I10 Essential (primary) hypertension: Secondary | ICD-10-CM

## 2021-09-19 DIAGNOSIS — E669 Obesity, unspecified: Secondary | ICD-10-CM

## 2021-09-19 DIAGNOSIS — E782 Mixed hyperlipidemia: Secondary | ICD-10-CM

## 2021-09-19 MED ORDER — BUPROPION HCL ER (XL) 300 MG PO TB24: 300.0000 mg | ORAL_TABLET | Freq: Every day | ORAL | 2 refills | Status: DC

## 2021-09-19 MED ORDER — TIRZEPATIDE 2.5 MG/0.5ML ~~LOC~~ SOAJ: 2.5000 mg | SUBCUTANEOUS | 0 refills | Status: DC

## 2021-09-19 NOTE — Progress Notes (Signed)
[facility] [address], [place] [address]  Internal MEDICINE  Office Visit Note  Patient Name: Gerald [name]  [id number]  [id number]  Date of Service: 09/24/2021  Chief Complaint  Patient presents with   Follow-up   Depression   Hyperlipidemia   Hypertension    HPI Pt is here for routine follow up -He got a letter from insurance about not covering cpap, but doesn't remember exactly what it said. Discussed I had ordered a titration to determine what settings needed prior to ordering replacement. Will look into this further and will have him schedule a sleep clinic appt for download to show he has been using and then will order new machine/titration study. -Labs reviewed: elevated TG, but LDL improved. Cant do statins, but will try fish oil. A1c up slightly to 6.5. He is interested in trying mounjaro to help his sugars as well as wt loss benefits. -He states his weight continues to fluctuate.and is up 9lbs from last visit -Tolerating buspar, but hasnt really helped. Will increase wellbutrin. Declines psych referral. -noticed soft tissue mass under left breast about 2 weeks ago and has gotten bigger. Tender to touch, 2cm rod-like mass. No evidence of infection. Denies any injuries to the area.  Current Medication: Outpatient Encounter Medications as of 09/19/2021  Medication Sig   acetaminophen (TYLENOL) 500 MG tablet Take 2 tablets (1,000 mg total) by mouth every 6 (six) hours as needed for mild pain.   amLODipine (NORVASC) 5 MG tablet TAKE 1 TABLET (5 MG TOTAL) BY MOUTH DAILY.   buPROPion (WELLBUTRIN XL) 300 MG 24 hr tablet Take 1 tablet (300 mg total) by mouth daily.   busPIRone (BUSPAR) 5 MG tablet Take 1 tablet (5 mg total) by mouth 2 (two) times daily.   citalopram (CELEXA) 40 MG tablet TAKE 1 TABLET BY MOUTH EVERY DAY FOR GAD   doxycycline (VIBRA-TABS) 100 MG tablet Take 1 tablet (100 mg total) by mouth 2 (two) times daily.   furosemide (LASIX) 20 MG  tablet TAKE 1 TABLET BY MOUTH EVERY DAY AS NEEDED FOR LEG SWELLING   gabapentin (NEURONTIN) 100 MG capsule Take 2 capsules (200 mg total) by mouth 3 (three) times daily.   lisinopril-hydrochlorothiazide (ZESTORETIC) 20-25 MG tablet Take 1 tablet by mouth daily.   Multiple Vitamins-Minerals (MULTIVITAMIN ADULTS PO) Take 1 tablet by mouth daily.   mupirocin ointment (BACTROBAN) 2 % Apply 1 application  topically daily. To affected area until healed.   Na Sulfate-K Sulfate-Mg Sulf 17.5-3.13-1.6 GM/177ML SOLN    nitroGLYCERIN (NITROSTAT) 0.4 MG SL tablet Place 1 tablet (0.4 mg total) under the tongue every 5 (five) minutes x 3 doses as needed for chest pain.   oxyCODONE (OXY IR/ROXICODONE) 5 MG immediate release tablet Take 1 tablet (5 mg total) by mouth every 4 (four) hours as needed for severe pain.   tirzepatide (MOUNJARO) 2.5 MG/0.5ML Pen Inject 2.5 mg into the skin once a week.   [DISCONTINUED] buPROPion (WELLBUTRIN XL) 150 MG 24 hr tablet TAKE 1 TABLET BY MOUTH EVERY DAY   No facility-administered encounter medications on file as of 09/19/2021.    Surgical History: Past Surgical History:  Procedure Laterality Date   BONE EXCISION Right 11/21/2020   Procedure: PART EXCISION BONE- PHALANX;  Surgeon: Samara Deist, DPM;  Location: [place];  Service: Podiatry;  Laterality: Right;   CARDIAC CATHETERIZATION N/A 09/2011   [facility]; EF 50% with 30% mid LAD stenosis and no obstructive disease.   CARDIAC CATHETERIZATION  09/2010   [place]; Mid LAD 40% stenosis; Mid Circumflex:Normal; Mid RCA; Normal   CARDIAC CATHETERIZATION     COLONOSCOPY     COLONOSCOPY WITH PROPOFOL N/A 04/10/2021   Procedure: COLONOSCOPY WITH PROPOFOL;  Surgeon: Jonathon Bellows, MD;  Location: [facility] ENDOSCOPY;  Service: Gastroenterology;  Laterality: N/A;   ESOPHAGOGASTRODUODENOSCOPY N/A 04/10/2021   Procedure: ESOPHAGOGASTRODUODENOSCOPY (EGD);  Surgeon: Jonathon Bellows, MD;  Location: [facility] ENDOSCOPY;  Service: Gastroenterology;   Laterality: N/A;   ESOPHAGOGASTRODUODENOSCOPY (EGD) WITH PROPOFOL N/A 06/10/2017   Procedure: ESOPHAGOGASTRODUODENOSCOPY (EGD) WITH PROPOFOL;  Surgeon: Jonathon Bellows, MD;  Location: [facility] ENDOSCOPY;  Service: Gastroenterology;  Laterality: N/A;   HAMMER TOE SURGERY Right 11/21/2020   Procedure: HAMMERTOE CORRECTION;  Surgeon: Samara Deist, DPM;  Location: [place];  Service: Podiatry;  Laterality: Right;   INSERTION OF MESH  04/16/2021   Procedure: INSERTION OF MESH;  Surgeon: Olean Ree, MD;  Location: [facility] ORS;  Service: General;;   KNEE ARTHROSCOPY WITH MEDIAL MENISECTOMY Right 09/16/2012   Procedure: RIGHT KNEE ARTHROSCOPY WITH MEDIAL AND LATERAL MENISECTOMY, CHONDROPLASTY;  Surgeon: Ninetta Lights, MD;  Location: [place];  Service: Orthopedics;  Laterality: Right;  RIGHT KNEE SCOPE MEDIAL MENISCECTOMY   LEFT HEART CATH AND CORONARY ANGIOGRAPHY N/A 06/11/2016   Procedure: Left Heart Cath and Coronary Angiography;  Surgeon: Minna Merritts, MD;  Location: [place] CV LAB;  Service: Cardiovascular;  Laterality: N/A;   UMBILICAL HERNIA REPAIR N/A 04/16/2021   Procedure: HERNIA REPAIR UMBILICAL ADULT, open;  Surgeon: Olean Ree, MD;  Location: [facility] ORS;  Service: General;  Laterality: N/A;    Medical History: Past Medical History:  Diagnosis Date   Abnormal LFTs    Anxiety    Atypical chest pain    Borderline diabetes    CAD (coronary artery disease)    a.) PCI and stent placement (unknown type) to mLAD and mLCx; date unknown. b.) LHC 09/10/2010: 40% mLAD; no intervention. c.) LHC 09/12/2011: EF 60%; 30% ISR mLAD, 40% dLAD, 30% pLCx, 30% ISR mLCx, 40% mRCA; no intervention. d.) LHC 05/23/2014: EF >55%; 30% p-mLAD; no intervention. e.) LHC 06/11/2016: EF 55-65%; LVEDP norm; 30-40% p-mLAD; no intervention.   Carcinoma ([place])    Chronic pain syndrome    Depression    Fundic gland polyps of stomach, benign    Gastritis    GERD (gastroesophageal reflux  disease)    Hepatic steatosis    Hyperlipidemia    Hypertension    Migraines    Myocardial infarction [facility]) 2008   was told by PCP   Nonischemic cardiomyopathy ([place])    a.) TTE 09/12/2011: EF 35-45%. b.) LHC 09/12/2011: EF 60%. c.) TTE 06/04/2012: EF 55-60%. d.) LHC 05/23/2014: EF >55%. e.) TTE 06/11/2016: EF 55-60%; G1DD. f.) LHC 06/11/2016: EF 55-65%. g.) TTE 01/08/2018: EF 60-65%.   Obesity    Occlusion and stenosis of bilateral carotid arteries    OSA on CPAP    Osteoarthritis of both knees    Shortness of breath    Squamous cell carcinoma of skin 02/27/2020   left distal lat deltoid - [facility]    Family History: Family History  Problem Relation Age of Onset   Heart disease Father 44       MI   Heart attack Father    Stomach cancer Father    Hypertension Mother    Breast cancer Mother     Social History   Socioeconomic History   Marital status: Married    Spouse name: Not on  file   Number of children: 3   Years of education: Not on file   Highest education level: Not on file  Occupational History   Occupation: Librarian, academic  Tobacco Use   Smoking status: Never   Smokeless tobacco: Former    Quit date: 1987  Scientific laboratory technician Use: Never used  Substance and Sexual Activity   Alcohol use: Not Currently   Drug use: No   Sexual activity: Not on file  Other Topics Concern   Not on file  Social History Narrative   Married.  78 yo son.   Wife on disability for bipolar disorder.     Social Determinants of Health   Financial Resource Strain: Not on file  Food Insecurity: Not on file  Transportation Needs: Not on file  Physical Activity: Not on file  Stress: Not on file  Social Connections: Not on file  Intimate Partner Violence: Not on file      Review of Systems  Constitutional:  Positive for fatigue. Negative for chills and unexpected weight change.  HENT:  Negative for congestion, postnasal drip, rhinorrhea, sneezing and sore throat.   Eyes:   Negative for redness.  Respiratory:  Negative for cough, chest tightness and shortness of breath.   Cardiovascular:  Negative for chest pain and palpitations.  Gastrointestinal:  Negative for abdominal pain, constipation, diarrhea, nausea and vomiting.  Genitourinary:  Negative for dysuria and frequency.  Musculoskeletal:  Positive for arthralgias. Negative for back pain, joint swelling and neck pain.  Skin:        Mass under left breast  Neurological:  Negative for tremors.  Hematological:  Negative for adenopathy. Does not bruise/bleed easily.  Psychiatric/Behavioral:  Positive for sleep disturbance. Negative for behavioral problems (Depression) and suicidal ideas. The patient is nervous/anxious.     Vital Signs: BP 135/71   Pulse 72   Temp 97.8 F (36.6 C)   Resp 16   Ht '6\' 2"'$  (1.88 m)   Wt (!) 308 lb (139.7 kg)   SpO2 96%   BMI 39.54 kg/m    Physical Exam Vitals and nursing note reviewed.  Constitutional:      General: He is not in acute distress.    Appearance: He is well-developed. He is obese. He is not diaphoretic.  HENT:     Head: Normocephalic and atraumatic.     Mouth/Throat:     Pharynx: No oropharyngeal exudate.  Eyes:     Pupils: Pupils are equal, round, and reactive to light.  Neck:     Thyroid: No thyromegaly.     Vascular: No JVD.     Trachea: No tracheal deviation.  Cardiovascular:     Rate and Rhythm: Normal rate and regular rhythm.     Heart sounds: Normal heart sounds. No murmur heard.    No friction rub. No gallop.  Pulmonary:     Effort: Pulmonary effort is normal. No respiratory distress.     Breath sounds: No wheezing or rales.  Chest:     Chest wall: No tenderness.  Abdominal:     General: Bowel sounds are normal.     Palpations: Abdomen is soft.  Musculoskeletal:        General: Normal range of motion.     Cervical back: Normal range of motion and neck supple.     Right lower leg: No edema.     Left lower leg: No edema.   Lymphadenopathy:     Cervical: No cervical adenopathy.  Skin:    General: Skin is warm and dry.     Comments: 1cmx2cm soft tissue mass palpable under left breast of chest wall. Tender to the touch. No signs of infection.  Neurological:     Mental Status: He is alert and oriented to person, place, and time.     Cranial Nerves: No cranial nerve deficit.  Psychiatric:        Behavior: Behavior normal.        Thought Content: Thought content normal.        Judgment: Judgment normal.        Assessment/Plan: 1. Type 2 diabetes mellitus with hyperglycemia, unspecified whether long term insulin use (HCC) A1c 6.5 which is up from 6.2 last check. Will start mounjaro and titrate up as indicated. Continue to work on diet and exercise - tirzepatide [facility]) 2.5 MG/0.5ML Pen; Inject 2.5 mg into the skin once a week.  Dispense: 2 mL; Refill: 0  2. Essential hypertension Stable, continue current medications  3. Generalized anxiety disorder Will increase wellbutrin and continune other medications as before. Declines psych referral at this time. - buPROPion (WELLBUTRIN XL) 300 MG 24 hr tablet; Take 1 tablet (300 mg total) by mouth daily.  Dispense: 30 tablet; Refill: 2  4. Soft tissue mass Will order [place] for further evaluation. Call if new or worsening symptoms - [place] CHEST SOFT TISSUE; Future  5. Mixed hyperlipidemia Cannot take statins, will start fish oil supplement and continue to work on diet and exercise  6. OSA (obstructive sleep apnea) Will look into why titration not scheduled and will schedule sleep clinic visit to try to obtain download prior to ordering replacement. Given fatigue, if download does not show control would advise moving forward with titration.  7. Obesity (BMI 30-39.9) Will start mounjaro to help BG and wt loss. Continue to work on diet and exercise.   General Counseling: conor lata understanding of the findings of todays visit and agrees with plan of  treatment. I have discussed any further diagnostic evaluation that may be needed or ordered today. We also reviewed his medications today. he has been encouraged to call the office with any questions or concerns that should arise related to todays visit.    Orders Placed This Encounter  Procedures   [place] CHEST SOFT TISSUE    Meds ordered this encounter  Medications   tirzepatide (MOUNJARO) 2.5 MG/0.5ML Pen    Sig: Inject 2.5 mg into the skin once a week.    Dispense:  2 mL    Refill:  0   buPROPion (WELLBUTRIN XL) 300 MG 24 hr tablet    Sig: Take 1 tablet (300 mg total) by mouth daily.    Dispense:  30 tablet    Refill:  2    This patient was seen by Drema Dallas, PA-C in collaboration with Dr. Clayborn Bigness as a part of collaborative care agreement.   Total time spent:35 Minutes Time spent includes review of chart, medications, test results, and follow up plan with the patient.      Dr Lavera Guise Internal medicine

## 2021-09-26 ENCOUNTER — Telehealth: Payer: Self-pay

## 2021-09-26 ENCOUNTER — Other Ambulatory Visit: Payer: Self-pay

## 2021-09-26 MED ORDER — AZITHROMYCIN 250 MG PO TABS: ORAL_TABLET | ORAL | 0 refills | Status: DC

## 2021-09-26 NOTE — Telephone Encounter (Signed)
Pt called that having sinus infection,sore throat advised as per lauren send zpak to [facility]

## 2021-09-30 ENCOUNTER — Telehealth: Payer: Self-pay

## 2021-09-30 NOTE — Telephone Encounter (Signed)
HST scheduled for 10/08/21-Toni

## 2021-10-01 ENCOUNTER — Telehealth: Payer: Self-pay

## 2021-10-01 NOTE — Telephone Encounter (Signed)
Per Nira Conn with [facility] orthopedics, referral closed. Patient did not return calls to schedule-Toni

## 2021-10-03 ENCOUNTER — Ambulatory Visit (INDEPENDENT_AMBULATORY_CARE_PROVIDER_SITE_OTHER): Payer: 59 | Admitting: Urology

## 2021-10-03 ENCOUNTER — Encounter: Payer: Self-pay | Admitting: Urology

## 2021-10-03 VITALS — BP 100/67 | HR 80 | Ht 74.0 in | Wt 281.0 lb

## 2021-10-03 DIAGNOSIS — Z125 Encounter for screening for malignant neoplasm of prostate: Secondary | ICD-10-CM

## 2021-10-03 DIAGNOSIS — N529 Male erectile dysfunction, unspecified: Secondary | ICD-10-CM | POA: Diagnosis not present

## 2021-10-03 DIAGNOSIS — N3943 Post-void dribbling: Secondary | ICD-10-CM | POA: Diagnosis not present

## 2021-10-03 DIAGNOSIS — N5082 Scrotal pain: Secondary | ICD-10-CM

## 2021-10-03 DIAGNOSIS — R399 Unspecified symptoms and signs involving the genitourinary system: Secondary | ICD-10-CM

## 2021-10-03 DIAGNOSIS — R102 Pelvic and perineal pain: Secondary | ICD-10-CM

## 2021-10-03 MED ORDER — SILDENAFIL CITRATE 50 MG PO TABS: 50.0000 mg | ORAL_TABLET | Freq: Every day | ORAL | 11 refills | Status: DC | PRN

## 2021-10-03 NOTE — Patient Instructions (Signed)
Erectile Dysfunction Erectile dysfunction (ED) is the inability to get or keep an erection in order to have sexual intercourse. ED is considered a symptom of an underlying disorder and is not considered a disease. ED may include: Inability to get an erection. Lack of enough hardness of the erection to allow penetration. Loss of erection before sex is finished. What are the causes? This condition may be caused by: Physical causes, such as: Artery problems. This may include heart disease, high blood pressure, atherosclerosis, and diabetes. Hormonal problems, such as low testosterone. Obesity. Nerve problems. This may include back or pelvic injuries, multiple sclerosis, Parkinson's disease, spinal cord injury, and stroke. Certain medicines, such as: Pain relievers. Antidepressants. Blood pressure medicines and water pills (diuretics). Cancer medicines. Antihistamines. Muscle relaxants. Lifestyle factors, such as: Use of drugs such as marijuana, cocaine, or opioids. Excessive use of alcohol. Smoking. Lack of physical activity or exercise. Psychological causes, such as: Anxiety or stress. Sadness or depression. Exhaustion. Fear about sexual performance. Guilt. What are the signs or symptoms? Symptoms of this condition include: Inability to get an erection. Lack of enough hardness of the erection to allow penetration. Loss of the erection before sex is finished. Sometimes having normal erections, but with frequent unsatisfactory episodes. Low sexual satisfaction in either partner due to erection problems. A curved penis occurring with erection. The curve may cause pain, or the penis may be too curved to allow for intercourse. Never having nighttime or morning erections. How is this diagnosed? This condition is often diagnosed by: Performing a physical exam to find other diseases or specific problems with the penis. Asking you detailed questions about the problem. Doing tests,  such as: Blood tests to check for diabetes mellitus or high cholesterol, or to measure hormone levels. Other tests to check for underlying health conditions. An ultrasound exam to check for scarring. A test to check blood flow to the penis. Doing a sleep study at home to measure nighttime erections. How is this treated? This condition may be treated by: Medicines, such as: Medicine taken by mouth to help you achieve an erection (oral medicine). Hormone replacement therapy to replace low testosterone levels. Medicine that is injected into the penis. Your health care provider may instruct you how to give yourself these injections at home. Medicine that is delivered with a short applicator tube. The tube is inserted into the opening at the tip of the penis, which is the opening of the urethra. A tiny pellet of medicine is put in the urethra. The pellet dissolves and enhances erectile function. This is also called MUSE (medicated urethral system for erections) therapy. Vacuum pump. This is a pump with a ring on it. The pump and ring are placed on the penis and used to create pressure that helps the penis become erect. Penile implant surgery. In this procedure, you may receive: An inflatable implant. This consists of cylinders, a pump, and a reservoir. The cylinders can be inflated with a fluid that helps to create an erection, and they can be deflated after intercourse. A semi-rigid implant. This consists of two silicone rubber rods. The rods provide some rigidity. They are also flexible, so the penis can both curve downward in its normal position and become straight for sexual intercourse. Blood vessel surgery to improve blood flow to the penis. During this procedure, a blood vessel from a different part of the body is placed into the penis to allow blood to flow around (bypass) damaged or blocked blood vessels. Lifestyle changes,  such as exercising more, losing weight, and quitting smoking. Follow  these instructions at home: Medicines  Take over-the-counter and prescription medicines only as told by your health care provider. Do not increase the dosage without first discussing it with your health care provider. If you are using self-injections, do injections as directed by your health care provider. Make sure you avoid any veins that are on the surface of the penis. After giving an injection, apply pressure to the injection site for 5 minutes. Talk to your health care provider about how to prevent headaches while taking ED medicines. These medicines may cause a sudden headache due to the increase in blood flow in your body. General instructions Exercise regularly, as directed by your health care provider. Work with your health care provider to lose weight, if needed. Do not use any products that contain nicotine or tobacco. These products include cigarettes, chewing tobacco, and vaping devices, such as e-cigarettes. If you need help quitting, ask your health care provider. Before using a vacuum pump, read the instructions that come with the pump and discuss any questions with your health care provider. Keep all follow-up visits. This is important. Contact a health care provider if: You feel nauseous. You are vomiting. You get sudden headaches while taking ED medicines. You have any concerns about your sexual health. Get help right away if: You are taking oral or injectable medicines and you have an erection that lasts longer than 4 hours. If your health care provider is unavailable, go to the nearest emergency room for evaluation. An erection that lasts much longer than 4 hours can result in permanent damage to your penis. You have severe pain in your groin or abdomen. You develop redness or severe swelling of your penis. You have redness spreading at your groin or lower abdomen. You are unable to urinate. You experience chest pain or a rapid heartbeat (palpitations) after taking oral  medicines. These symptoms may represent a serious problem that is an emergency. Do not wait to see if the symptoms will go away. Get medical help right away. Call your local emergency services (911 in the [place] Do not drive yourself to the hospital. Summary Erectile dysfunction (ED) is the inability to get or keep an erection during sexual intercourse. This condition is diagnosed based on a physical exam, your symptoms, and tests to determine the cause. Treatment varies depending on the cause and may include medicines, hormone therapy, surgery, or a vacuum pump. You may need follow-up visits to make sure that you are using your medicines or devices correctly. Get help right away if you are taking or injecting medicines and you have an erection that lasts longer than 4 hours. This information is not intended to replace advice given to you by your health care provider. Make sure you discuss any questions you have with your health care provider. Document Revised: 04/18/2020 Document Reviewed: 04/18/2020 Elsevier Patient Education  [place].  Pelvic Pain, Male Pelvic pain is pain in your lower abdomen, below your belly button and between your hips. The pain may start suddenly (be acute), keep coming back (recur), or last a long time (become chronic). Pelvic pain that lasts longer than 6 months is considered chronic. There are many possible causes of pelvic pain. Sometimes, the cause is not known. Pelvic pain may affect your: Prostate gland. Urinary system. Digestive tract. Musculoskeletal system. Strained muscles or ligaments may cause pelvic pain. Follow these instructions at home: Medicines Take over-the-counter and prescription medicines only  as told by your health care provider. If you were prescribed an antibiotic medicine, take it as told by your health care provider. Do not stop taking the antibiotic even if you start to feel better. Managing pain, stiffness, and swelling  Take  warm water baths (sitz baths). Sitz baths help with relaxing your pelvic floor muscles. For a sitz bath, the water only comes up to your hips and covers your buttocks. A sitz bath may done at home in a bathtub or with a portable sitz bath that fits over the toilet. If directed, apply heat to the affected area before you exercise. Use the heat source that your health care provider recommends, such as a moist heat pack or a heating pad. Place a towel between your skin and the heat source. Leave the heat on for 20-30 minutes. Remove the heat if your skin turns bright red. This is especially important if you are unable to feel pain, heat, or cold. You may have a greater risk of getting burned. Activity Rest as told by your health care provider. Avoid activities that makes the pain worse. General instructions  Keep a journal of your pelvic pain. Write down: When the pain started. Where the pain is located. What seems to make the pain better or worse. Any symptoms you have along with the pain. Follow your treatment plan as told by your health care provider. This may include: Pelvic physical therapy. Yoga, meditation, and exercise. Biofeedback. This process trains you to manage your body's response (physiological response) through breathing techniques and relaxation methods. You will work with a therapist while machines are used to monitor your physical symptoms. Acupuncture. This is a type of treatment that involves stimulating specific points on your body by inserting thin needles through your skin to treat pain. Learn as much as you can about how to manage your pain. Ask your health care provider if a pain specialist would be helpful. Keep all follow-up visits. This is important. Contact a health care provider if: Medicine does not help your pain. Your pain comes back. You have new symptoms. You have a fever or chills. You are constipated. You have blood in your urine or stool. You feel  weak or light-headed. Get help right away if: You have sudden severe pain. Your pain steadily gets worse. You have severe pain along with fever, nausea, vomiting, or excessive sweating. Summary Pelvic pain is pain in your lower abdomen, below your belly button and between your hips. There are many possible causes of pelvic pain. Sometimes, the cause is not known. Take over-the-counter and prescription medicines only as told by your health care provider. Contact a health care provider if you have new or worsening symptoms. Get help right away if you have severe pain along with fever, nausea, vomiting, or excessive sweating. Keep all follow-up visits. This is important. This information is not intended to replace advice given to you by your health care provider. Make sure you discuss any questions you have with your health care provider. Document Revised: 01/09/2021 Document Reviewed: 01/09/2021 Elsevier Patient Education  [place] of a Plant-Based Diet for Urology Health  A plant-based diet emphasizes the consumption of whole, unprocessed plant foods while minimizing or excluding animal products including meat and dairy products. This dietary approach has gained attention for its potential to promote overall health, including urology-related conditions. Incorporating a plant-based diet into your lifestyle can offer numerous benefits for maintaining optimal urology health.  1. Reduced Risk  of Kidney Stones: A plant-based diet is typically rich in fruits, vegetables, legumes, and whole grains. These foods are high in dietary fiber, potassium, and magnesium, which can help reduce the risk of developing kidney stones. Be careful to avoid high quantities of spinach, as these can contribute to kidney stone formation if eaten in large volumes. The increased intake of water-soluble fiber can enhance the excretion of waste products and prevent the crystallization of minerals that  lead to stone formation.  2. Improved Prostate Health: Studies have suggested a link between the consumption of red and processed meats and an increased risk of prostate problems, including benign prostatic hyperplasia (BPH) and prostate cancer. By adopting a plant-based diet, you can lower your intake of saturated fats and decrease the risk of these conditions. PSA levels can often decrease on plant based diets! Plant foods are also rich in antioxidants and phytochemicals that have been associated with prostate health.  3. Better Bladder Function: A diet focused on plant-based foods can contribute to better bladder health by reducing the risk of urinary tract infections (UTIs). Berries, citrus fruits, and leafy greens are known for their high vitamin C content, which can acidify urine and create an environment less favorable for bacteria growth. Additionally, plant-based diets are generally lower in sodium, which can help prevent fluid retention and reduce the strain on the bladder.  4. Management of Erectile Dysfunction (ED): Some research suggests that a plant-based diet can positively impact erectile function. Plant-based diets are associated with improved cardiovascular health, which is crucial for maintaining healthy blood flow and nerve function required for proper erectile function. By reducing the consumption of high-cholesterol and high-saturated fat animal products, a plant-based diet may contribute to a decreased risk of ED.  5. Prevention of Chronic Conditions: A plant-based diet can help prevent or manage chronic conditions such as obesity, diabetes, and hypertension. These conditions can contribute to urology-related issues, including urinary incontinence and kidney dysfunction. By maintaining a healthy weight and managing these conditions, you can reduce the risk of urology-related complications.  Conclusion: Embracing a plant-based diet can offer significant benefits for urology  health. By incorporating a variety of colorful fruits, vegetables, whole grains, nuts, seeds, and legumes into your meals, you can support kidney health, prostate health, bladder function, and overall well-being. Remember to consult with a healthcare professional or registered dietitian before making any significant dietary changes, especially if you have existing health conditions. Your personalized approach to a plant-based diet can contribute to improved urology health and enhance your quality of life.

## 2021-10-03 NOTE — Progress Notes (Signed)
10/03/21 3:00 PM   Gerald [name] 09-16-1965 [id number]  CC: ED, right scrotal pain, urinary symptoms, screening PSA  HPI: 56 year old extremely comorbid male with a number of medical issues including obesity with BMI of 36(has lost 100 pounds in the last year), diabetes with residual peripheral neuropathy on gabapentin, CAD with history of stent placement, depression on SSRI.  He reports over a year of problems with erections, as well as some urinary dribbling and right-sided scrotal pain since multiple right-sided hernia repairs.  He denies any dysuria or gross hematuria.  He has nitrates on his med list but has not used this in over a year.  PSA normal at 1.7 in July 2022.  UA in August 2023 benign.  CT abdomen and pelvis with contrast March 2023 with no urologic abnormalities.   PMH: Past Medical History:  Diagnosis Date   Abnormal LFTs    Anxiety    Atypical chest pain    Borderline diabetes    CAD (coronary artery disease)    a.) PCI and stent placement (unknown type) to mLAD and mLCx; date unknown. b.) LHC 09/10/2010: 40% mLAD; no intervention. c.) LHC 09/12/2011: EF 60%; 30% ISR mLAD, 40% dLAD, 30% pLCx, 30% ISR mLCx, 40% mRCA; no intervention. d.) LHC 05/23/2014: EF >55%; 30% p-mLAD; no intervention. e.) LHC 06/11/2016: EF 55-65%; LVEDP norm; 30-40% p-mLAD; no intervention.   Carcinoma ([place])    Chronic pain syndrome    Depression    Fundic gland polyps of stomach, benign    Gastritis    GERD (gastroesophageal reflux disease)    Hepatic steatosis    Hyperlipidemia    Hypertension    Migraines    Myocardial infarction [facility]) 2008   was told by PCP   Nonischemic cardiomyopathy ([place])    a.) TTE 09/12/2011: EF 35-45%. b.) LHC 09/12/2011: EF 60%. c.) TTE 06/04/2012: EF 55-60%. d.) LHC 05/23/2014: EF >55%. e.) TTE 06/11/2016: EF 55-60%; G1DD. f.) LHC 06/11/2016: EF 55-65%. g.) TTE 01/08/2018: EF 60-65%.   Obesity    Occlusion and stenosis of bilateral carotid arteries     OSA on CPAP    Osteoarthritis of both knees    Shortness of breath    Squamous cell carcinoma of skin 02/27/2020   left distal lat deltoid - [facility]    Surgical History: Past Surgical History:  Procedure Laterality Date   BONE EXCISION Right 11/21/2020   Procedure: PART EXCISION BONE- PHALANX;  Surgeon: Samara Deist, DPM;  Location: [place];  Service: Podiatry;  Laterality: Right;   CARDIAC CATHETERIZATION N/A 09/2011   [facility]; EF 50% with 30% mid LAD stenosis and no obstructive disease.   CARDIAC CATHETERIZATION  09/2010   [facility]; Mid LAD 40% stenosis; Mid Circumflex:Normal; Mid RCA; Normal   CARDIAC CATHETERIZATION     COLONOSCOPY     COLONOSCOPY WITH PROPOFOL N/A 04/10/2021   Procedure: COLONOSCOPY WITH PROPOFOL;  Surgeon: Jonathon Bellows, MD;  Location: [facility] ENDOSCOPY;  Service: Gastroenterology;  Laterality: N/A;   ESOPHAGOGASTRODUODENOSCOPY N/A 04/10/2021   Procedure: ESOPHAGOGASTRODUODENOSCOPY (EGD);  Surgeon: Jonathon Bellows, MD;  Location: [facility] ENDOSCOPY;  Service: Gastroenterology;  Laterality: N/A;   ESOPHAGOGASTRODUODENOSCOPY (EGD) WITH PROPOFOL N/A 06/10/2017   Procedure: ESOPHAGOGASTRODUODENOSCOPY (EGD) WITH PROPOFOL;  Surgeon: Jonathon Bellows, MD;  Location: [facility] ENDOSCOPY;  Service: Gastroenterology;  Laterality: N/A;   HAMMER TOE SURGERY Right 11/21/2020   Procedure: HAMMERTOE CORRECTION;  Surgeon: Samara Deist, DPM;  Location: [place];  Service: Podiatry;  Laterality: Right;   INSERTION OF MESH  04/16/2021   Procedure: INSERTION OF MESH;  Surgeon: Olean Ree, MD;  Location: [facility] ORS;  Service: General;;   KNEE ARTHROSCOPY WITH MEDIAL MENISECTOMY Right 09/16/2012   Procedure: RIGHT KNEE ARTHROSCOPY WITH MEDIAL AND LATERAL MENISECTOMY, CHONDROPLASTY;  Surgeon: Ninetta Lights, MD;  Location: [place];  Service: Orthopedics;  Laterality: Right;  RIGHT KNEE SCOPE MEDIAL MENISCECTOMY   LEFT HEART CATH AND CORONARY ANGIOGRAPHY N/A 06/11/2016   Procedure:  Left Heart Cath and Coronary Angiography;  Surgeon: Minna Merritts, MD;  Location: [place] CV LAB;  Service: Cardiovascular;  Laterality: N/A;   UMBILICAL HERNIA REPAIR N/A 04/16/2021   Procedure: HERNIA REPAIR UMBILICAL ADULT, open;  Surgeon: Olean Ree, MD;  Location: [facility] ORS;  Service: General;  Laterality: N/A;     Family History: Family History  Problem Relation Age of Onset   Heart disease Father 10       MI   Heart attack Father    Stomach cancer Father    Hypertension Mother    Breast cancer Mother     Social History:  reports that he has never smoked. He has been exposed to tobacco smoke. He quit smokeless tobacco use about 36 years ago. He reports that he does not currently use alcohol. He reports that he does not use drugs.  Physical Exam: BP 100/67   Pulse 80   Ht '6\' 2"'$  (1.88 m)   Wt 281 lb (127.5 kg)   BMI 36.08 kg/m    Constitutional:  Alert and oriented, No acute distress. Cardiovascular: No clubbing, cyanosis, or edema. Respiratory: Normal respiratory effort, no increased work of breathing. GI: Abdomen is soft, nontender, nondistended, no abdominal masses GU: Phallus with patent meatus, no lesions, testicles 20 cc and descended bilaterally without masses, mild right scrotal tenderness  Laboratory Data: Reviewed, see HPI  Pertinent Imaging: I have personally viewed and interpreted the CT abdomen and pelvis with contrast from March 2023 that shows no urologic abnormalities.  Assessment & Plan:   56 year old male with a number of comorbidities including obesity and BMI of 36, diabetes, depression who presents to urology for ED, mild urinary symptoms with some postvoid dribbling, and right-sided scrotal pain after numerous hernia surgery repairs.  I think we need to have realistic goals moving forward with his overall poor health and numerous comorbidities.  We discussed that a number of his comorbidities and medications are likely contributing to all  of these issues, and we discussed behavioral strategies including ongoing weight loss, healthy eating, and avoiding bladder irritants.  In terms of the ED, I recommended checking a testosterone per the AUA guideline recommendations, but we also discussed the controversy surrounding testosterone replacement in men with a history of CAD.  We also discussed that nitrates are a contraindication to PDE 5 inhibitors, and extensive counseling provided and he is interested in a trial of sildenafil 50 to 100 mg on demand, as he has not used nitrates in over a year.  Regarding the right scrotal pain, this is likely related to some nerve entrapment from his multiple right inguinal hernia surgeries.  We discussed not fitting underwear, NSAIDs, icing as needed, and consideration of increasing his gabapentin dose that he takes at baseline for peripheral neuropathy.  Could also consider referral to Dr. Cain Sieve in [place] to consider denervation in the future if persistent symptoms.  Trial of sildenafil 50 to 100 mg on demand Testosterone, call with results RTC 1 month symptom check  Nickolas Madrid, MD 10/03/2021  [facility] [address] [place], [place] [address] [phone]

## 2021-10-08 ENCOUNTER — Other Ambulatory Visit: Payer: Self-pay | Admitting: Physician Assistant

## 2021-10-08 DIAGNOSIS — I10 Essential (primary) hypertension: Secondary | ICD-10-CM

## 2021-10-09 ENCOUNTER — Encounter (INDEPENDENT_AMBULATORY_CARE_PROVIDER_SITE_OTHER): Payer: 59 | Admitting: Internal Medicine

## 2021-10-09 ENCOUNTER — Encounter: Payer: Self-pay | Admitting: Internal Medicine

## 2021-10-09 DIAGNOSIS — G4719 Other hypersomnia: Secondary | ICD-10-CM

## 2021-10-14 ENCOUNTER — Ambulatory Visit (INDEPENDENT_AMBULATORY_CARE_PROVIDER_SITE_OTHER): Payer: 59

## 2021-10-14 DIAGNOSIS — M7989 Other specified soft tissue disorders: Secondary | ICD-10-CM

## 2021-10-16 ENCOUNTER — Other Ambulatory Visit: Payer: 59

## 2021-10-16 DIAGNOSIS — N529 Male erectile dysfunction, unspecified: Secondary | ICD-10-CM

## 2021-10-17 ENCOUNTER — Ambulatory Visit: Payer: 59 | Admitting: Physician Assistant

## 2021-10-17 LAB — TESTOSTERONE: Testosterone: 118 ng/dL — ABNORMAL LOW (ref 264–916)

## 2021-10-21 ENCOUNTER — Telehealth: Payer: Self-pay

## 2021-10-21 NOTE — Telephone Encounter (Signed)
I left a message for the patient to return my call.

## 2021-10-21 NOTE — Telephone Encounter (Signed)
Called and spoke with pt regarding his lab results, and made him an appt to come in 1 week before his appt to see Dr Chrystine Oiler.

## 2021-10-21 NOTE — Telephone Encounter (Signed)
Kyung Rudd returned [employer].  Please call.

## 2021-10-21 NOTE — Telephone Encounter (Signed)
-----   Message from [name], MD sent at 10/17/2021  5:28 PM EDT ----- Testosterone is low, need to repeat total testosterone, PSA, LH before 10am about 1 week prior to next appt for confirmation prior to considering any testosterone replacement  Nickolas Madrid, MD 10/17/2021

## 2021-10-22 ENCOUNTER — Telehealth: Payer: Self-pay

## 2021-10-22 MED ORDER — MOUNJARO 5 MG/0.5ML ~~LOC~~ SOAJ: 5.0000 mg | SUBCUTANEOUS | 0 refills | Status: DC

## 2021-10-22 NOTE — Procedures (Signed)
[place]  Portable Polysomnogram Report Part 1 Phone: [phone] Fax: [phone]  Patient Name: Gerald [name], Gerald [name] Recording Device: Glee Arvin  D.O.B.: 24-Nov-1965 Acquisition Number: [id number]  Referring Physician: Drema Dallas, PA Acquisition Date: 10/09/2021   History: The patient is a 56 years old male who was referred for re-evaluation of obstructive sleep apnea.  Medical History: hypertension, hyperlipidemia, depression.  Medications: amlodipine, bupropion, buspirone, citalopram, doxycycline, furosemide, gabapentin, lisinopril-HCTZ, migraines, history of MI, osteoporosis, carcinoma, anxiety.  PROCEDURE  The unattended portable polysomnogram was conducted on the night of 10/09/2021.  The following parameters were monitored: Nasal and oral airflow, and body position. Additionally, thoracic and abdominal movements were recorded by inductance plethysmography. Oxygen saturation (SpO2) and heart rate (ECG) was monitored using a pulse Oximeter.  The tracing was scored using 30 second epochs. Hypopneas were scored per AASM definition VIIID1.B (4% desaturation).    Description: The total recording time was 253.5 minutes. Sleep parameters are not recorded.  Respiratory monitoring demonstrated significant snoring across the night in all positions. There were a total of 90 apneas and hypopneas for a Respiratory Event Index of 21.7 apneas and hypopneas per hour of recording. The average duration of the respiratory events was 26.2 seconds with a maximum duration of 54.0 seconds. The respiratory events were associated with peripheral oxygen desaturations on the average to 92 %. The lowest oxygen desaturation associated with a respiratory event was 71 %. Additionally, the mean oxygen saturation was 92 %. The total duration of oxygen < 90% was 28.4 minutes and <80% was 1.8 minutes.   Cardiac monitoring- The average heart rate during the recording was 74.5  bpm.  Impression: This routine overnight portable polysomnogram confirms the presence of obstructive sleep apnea. Overall the Apnea Hypopnea Index was 21.7 apneas and hypopneas per hour of sleep.  The respiratory events occurred in a cluster in the early morning suggesting REM-related apnea with the lowest desaturation to 71 %. This occurred in the supine position. If the patient has been using CPAP regularly, the findings likely underestimate the severity of the sleep apnea.  Recommendations:     A CPAP titration would be recommended due to the severity of the sleep apnea. Some supine sleep should be ensured to optimize the titration. Would recommend weight loss in a patient with a BMI of 40.5 lb/in2.  Bing Plume., MD, FAASM Diplomate, American Board of Sleep Medicine  Diplomate, ABPN- Sleep Medicine Electronically reviewed and digitally signed   [place]  Portable Polysomnogram Report Part 2 Phone: [phone] Fax: [phone]   Study Date: 10/09/2021  Patient Name: Gerald [name] Recording Device: Glee Arvin  Sex: M Height: 72.0 in.  D.O.B.: [birth year] Weight: 299.0 lbs.  Age: 33 years B.M.I: 40.5 lb/in2   Times and Durations  Lights off clock time:  4:03:32 AM Total Recording Time (TRT): 253.5 minutes  Lights on clock time: 8:17:02 AM Time In Bed (TIB): 253.5 minutes   Summary  AHI 21.7 OAI 6.5 CAI 0.0 Lowest Desat 71  AHI is the number of apneas and hypopneas per hour. OAI is the number of obstructive apneas per hour. CAI is the number of central apneas per hour. Lowest Desat is the lowest blood oxygen level that lasted at least 2 seconds.  RESPIRATORY EVENTS   Index (#/hour) Total # of Events Mean duration  (sec) Max duration  (sec) # of Events by Position       Supine Prone Left Right Up  Central  Apneas 0.0 0 0.0 0.0 0    0 0 0  Obstructive Apneas 6.5 27 22.9 50.'5 23    4 '$ 0 0  Mixed Apneas 0.2 1 15.0 15.0 1    0 0 0  Hypopneas 15.0 62 27.8  54.0 45    11 6 0  Apneas + Hypopneas 21.7 90 26.2 54.0 69    15 6 0  Total 21.7 90 26.2 54.0 69    15 6 0  Time in Position 72.4    83.7 92.4 2.8  AHI in Position 57.3    10.8 3.9 0.0    Oximetry Summary   Dur. (min) % TIB  <90 % 28.4 11.2  <85 % 6.1 2.4  <80 % 1.8 0.7  <70 % 0.0 0.0  Total Dur (min) < 89 17.6 min  Average (%) 92  Total # of Desats 128  Desat Index (#/hour) 31.1  Desat Max (%) 19  Desat Max dur (sec) 65.0  Lowest SpO2 % during sleep 71  Duration of Min SpO2 (sec) 6    Heart Rate Stats  Mean HR during sleep (BPM)  Highest HR during sleep 91  (BPM)  Highest HR during TIB  115 (BPM)    Snoring Summary  Total Snoring Episodes 47  Total Duration with Snoring 6.5 minutes  Mean Duration of Snoring 8.3 seconds  Percentage of Snoring 2.6 %

## 2021-10-22 NOTE — Telephone Encounter (Signed)
Called pt and spoke to wife Gerald [name] and informed her that the new dose of mounjaro 5 mg was sent to pharmacy

## 2021-10-25 ENCOUNTER — Other Ambulatory Visit: Payer: Self-pay | Admitting: *Deleted

## 2021-10-25 DIAGNOSIS — R399 Unspecified symptoms and signs involving the genitourinary system: Secondary | ICD-10-CM

## 2021-10-25 DIAGNOSIS — N529 Male erectile dysfunction, unspecified: Secondary | ICD-10-CM

## 2021-10-25 DIAGNOSIS — Z125 Encounter for screening for malignant neoplasm of prostate: Secondary | ICD-10-CM

## 2021-10-28 ENCOUNTER — Telehealth: Payer: Self-pay | Admitting: Physician Assistant

## 2021-10-28 ENCOUNTER — Encounter: Payer: Self-pay | Admitting: Physician Assistant

## 2021-10-28 ENCOUNTER — Ambulatory Visit (INDEPENDENT_AMBULATORY_CARE_PROVIDER_SITE_OTHER): Payer: 59 | Admitting: Physician Assistant

## 2021-10-28 ENCOUNTER — Other Ambulatory Visit: Payer: 59

## 2021-10-28 DIAGNOSIS — K921 Melena: Secondary | ICD-10-CM

## 2021-10-28 DIAGNOSIS — G8929 Other chronic pain: Secondary | ICD-10-CM

## 2021-10-28 DIAGNOSIS — G5793 Unspecified mononeuropathy of bilateral lower limbs: Secondary | ICD-10-CM | POA: Diagnosis not present

## 2021-10-28 DIAGNOSIS — M25512 Pain in left shoulder: Secondary | ICD-10-CM | POA: Diagnosis not present

## 2021-10-28 DIAGNOSIS — R399 Unspecified symptoms and signs involving the genitourinary system: Secondary | ICD-10-CM

## 2021-10-28 DIAGNOSIS — I10 Essential (primary) hypertension: Secondary | ICD-10-CM | POA: Diagnosis not present

## 2021-10-28 DIAGNOSIS — E1165 Type 2 diabetes mellitus with hyperglycemia: Secondary | ICD-10-CM

## 2021-10-28 DIAGNOSIS — N529 Male erectile dysfunction, unspecified: Secondary | ICD-10-CM

## 2021-10-28 DIAGNOSIS — G4733 Obstructive sleep apnea (adult) (pediatric): Secondary | ICD-10-CM | POA: Diagnosis not present

## 2021-10-28 DIAGNOSIS — F411 Generalized anxiety disorder: Secondary | ICD-10-CM

## 2021-10-28 DIAGNOSIS — Z125 Encounter for screening for malignant neoplasm of prostate: Secondary | ICD-10-CM

## 2021-10-28 MED ORDER — ZOSTER VAC RECOMB ADJUVANTED 50 MCG/0.5ML IM SUSR: 0.5000 mL | Freq: Once | INTRAMUSCULAR | 0 refills | Status: AC

## 2021-10-28 MED ORDER — GABAPENTIN 300 MG PO CAPS: 300.0000 mg | ORAL_CAPSULE | Freq: Three times a day (TID) | ORAL | 1 refills | Status: DC

## 2021-10-28 NOTE — Telephone Encounter (Signed)
Awaiting 10/28/21 office notes for Cpap Titration order-Toni

## 2021-10-28 NOTE — Progress Notes (Unsigned)
[facility] [place], [place] [address]  Internal MEDICINE  Office Visit Note  Patient Name: Gerald [name]  [id number]  [id number]  Date of Service: 10/30/2021  Chief Complaint  Patient presents with   Follow-up   Depression   Hypertension   Hyperlipidemia   Gastroesophageal Reflux    HPI Pt is here for routine follow up -Blood in stool, stool is very dark in color, some brighter blood on tissue paper. Noticed it a couple weeks ago. No irritation or pain. He sees Dr. Vicente Males for GI and is going to call for appt to discuss this. Advised to stop ibuprofen/NSAID use -Did go to urology due to ED and states they found very low testosterone and he had additional labs and thinks his PSA may have been abnormal as well. He just had additional labs today so doesn't know the results yet or had any follow up yet. -Did have new HST as this was required by insurance before a titration for new machine could be done. Will go ahead and order Cpap titration in order to determine settings for new machine. He does have appt for download on his machine with [facility], but he states it is so old that he doesn't think any data will transmit and would like to still plan for titration. -he has been taking gabapentin '300mg'$  TID and is working better, would like to switch to '300mg'$  capsules instead of taking mulitple '100mg'$  caps -Did have US soft tissue mass, but the area in question went away prior to exam therefore nothing seen on [place]. It was advised that a mammogram may be ordered to evaluate further due to location however given resolution he would like to hold off and will monitor for now. -Left shoulder pain when lifting and has decreased ROM, has tried tylenol and ibuprofen. Would like referral to orthopedist and requests Dr. Tamera Punt -will go back to '150mg'$  wellbutrin because it seemed to make him more tired when he went up to '300mg'$ . -Tolerating mounjaro and recently took first round of '5mg'$   injection. Will call in a few weeks to see if next dose in titration warranted. He is down 3lbs since last visit  Current Medication: Outpatient Encounter Medications as of 10/28/2021  Medication Sig   acetaminophen (TYLENOL) 500 MG tablet Take 2 tablets (1,000 mg total) by mouth every 6 (six) hours as needed for mild pain.   amLODipine (NORVASC) 5 MG tablet TAKE 1 TABLET (5 MG TOTAL) BY MOUTH DAILY.   buPROPion (WELLBUTRIN XL) 150 MG 24 hr tablet Take 150 mg by mouth daily.   busPIRone (BUSPAR) 5 MG tablet Take 1 tablet (5 mg total) by mouth 2 (two) times daily.   citalopram (CELEXA) 40 MG tablet TAKE 1 TABLET BY MOUTH EVERY DAY FOR GAD   furosemide (LASIX) 20 MG tablet TAKE 1 TABLET BY MOUTH EVERY DAY AS NEEDED FOR LEG SWELLING   gabapentin (NEURONTIN) 300 MG capsule Take 1 capsule (300 mg total) by mouth 3 (three) times daily.   lisinopril-hydrochlorothiazide (ZESTORETIC) 20-25 MG tablet TAKE 1 TABLET BY MOUTH EVERY DAY   Multiple Vitamins-Minerals (MULTIVITAMIN ADULTS PO) Take 1 tablet by mouth daily.   nitroGLYCERIN (NITROSTAT) 0.4 MG SL tablet Place 1 tablet (0.4 mg total) under the tongue every 5 (five) minutes x 3 doses as needed for chest pain.   sildenafil (VIAGRA) 50 MG tablet Take 1-2 tablets (50-100 mg total) by mouth daily as needed for erectile dysfunction (take at least 30 minutes prior  to sexual activity on an empty stomach).   tirzepatide [facility]) 5 MG/0.5ML Pen Inject 5 mg into the skin once a week.   [DISCONTINUED] gabapentin (NEURONTIN) 100 MG capsule Take 2 capsules (200 mg total) by mouth 3 (three) times daily.   [DISCONTINUED] Zoster Vaccine Adjuvanted [facility]) injection Inject 0.5 mLs into the muscle once.   [EXPIRED] Zoster Vaccine Adjuvanted [facility]) injection Inject 0.5 mLs into the muscle once for 1 dose.   No facility-administered encounter medications on file as of 10/28/2021.    Surgical History: Past Surgical History:  Procedure Laterality Date   BONE  EXCISION Right 11/21/2020   Procedure: PART EXCISION BONE- PHALANX;  Surgeon: Samara Deist, DPM;  Location: [place];  Service: Podiatry;  Laterality: Right;   CARDIAC CATHETERIZATION N/A 09/2011   [facility]; EF 50% with 30% mid LAD stenosis and no obstructive disease.   CARDIAC CATHETERIZATION  09/2010   [facility]; Mid LAD 40% stenosis; Mid Circumflex:Normal; Mid RCA; Normal   CARDIAC CATHETERIZATION     COLONOSCOPY     COLONOSCOPY WITH PROPOFOL N/A 04/10/2021   Procedure: COLONOSCOPY WITH PROPOFOL;  Surgeon: Jonathon Bellows, MD;  Location: [facility] ENDOSCOPY;  Service: Gastroenterology;  Laterality: N/A;   ESOPHAGOGASTRODUODENOSCOPY N/A 04/10/2021   Procedure: ESOPHAGOGASTRODUODENOSCOPY (EGD);  Surgeon: Jonathon Bellows, MD;  Location: [facility] ENDOSCOPY;  Service: Gastroenterology;  Laterality: N/A;   ESOPHAGOGASTRODUODENOSCOPY (EGD) WITH PROPOFOL N/A 06/10/2017   Procedure: ESOPHAGOGASTRODUODENOSCOPY (EGD) WITH PROPOFOL;  Surgeon: Jonathon Bellows, MD;  Location: [facility] ENDOSCOPY;  Service: Gastroenterology;  Laterality: N/A;   HAMMER TOE SURGERY Right 11/21/2020   Procedure: HAMMERTOE CORRECTION;  Surgeon: Samara Deist, DPM;  Location: [place];  Service: Podiatry;  Laterality: Right;   INSERTION OF MESH  04/16/2021   Procedure: INSERTION OF MESH;  Surgeon: Olean Ree, MD;  Location: [facility] ORS;  Service: General;;   KNEE ARTHROSCOPY WITH MEDIAL MENISECTOMY Right 09/16/2012   Procedure: RIGHT KNEE ARTHROSCOPY WITH MEDIAL AND LATERAL MENISECTOMY, CHONDROPLASTY;  Surgeon: Ninetta Lights, MD;  Location: [place];  Service: Orthopedics;  Laterality: Right;  RIGHT KNEE SCOPE MEDIAL MENISCECTOMY   LEFT HEART CATH AND CORONARY ANGIOGRAPHY N/A 06/11/2016   Procedure: Left Heart Cath and Coronary Angiography;  Surgeon: Minna Merritts, MD;  Location: [place] [facility];  Service: Cardiovascular;  Laterality: N/A;   UMBILICAL HERNIA REPAIR N/A 04/16/2021   Procedure: HERNIA REPAIR UMBILICAL  ADULT, open;  Surgeon: Olean Ree, MD;  Location: [facility] ORS;  Service: General;  Laterality: N/A;    Medical History: Past Medical History:  Diagnosis Date   Abnormal LFTs    Anxiety    Atypical chest pain    Borderline diabetes    CAD (coronary artery disease)    a.) PCI and stent placement (unknown type) to mLAD and mLCx; date unknown. b.) LHC 09/10/2010: 40% mLAD; no intervention. c.) LHC 09/12/2011: EF 60%; 30% ISR mLAD, 40% dLAD, 30% pLCx, 30% ISR mLCx, 40% mRCA; no intervention. d.) LHC 05/23/2014: EF >55%; 30% p-mLAD; no intervention. e.) LHC 06/11/2016: EF 55-65%; LVEDP norm; 30-40% p-mLAD; no intervention.   Carcinoma ([place])    Chronic pain syndrome    Depression    Fundic gland polyps of stomach, benign    Gastritis    GERD (gastroesophageal reflux disease)    Hepatic steatosis    Hyperlipidemia    Hypertension    Migraines    Myocardial infarction [facility]) 2008   was told by PCP   Nonischemic cardiomyopathy ([place])    a.) TTE  09/12/2011: EF 35-45%. b.) LHC 09/12/2011: EF 60%. c.) TTE 06/04/2012: EF 55-60%. d.) LHC 05/23/2014: EF >55%. e.) TTE 06/11/2016: EF 55-60%; G1DD. f.) LHC 06/11/2016: EF 55-65%. g.) TTE 01/08/2018: EF 60-65%.   Obesity    Occlusion and stenosis of bilateral carotid arteries    OSA on CPAP    Osteoarthritis of both knees    Shortness of breath    Squamous cell carcinoma of skin 02/27/2020   left distal lat deltoid - [facility]    Family History: Family History  Problem Relation Age of Onset   Heart disease Father 46       MI   Heart attack Father    Stomach cancer Father    Hypertension Mother    Breast cancer Mother     Social History   Socioeconomic History   Marital status: Married    Spouse name: Not on file   Number of children: 3   Years of education: Not on file   Highest education level: Not on file  Occupational History   Occupation: Librarian, academic  Tobacco Use   Smoking status: Never    Passive exposure: Past   Smokeless  tobacco: Former    Quit date: 1987  Scientific laboratory technician Use: Never used  Substance and Sexual Activity   Alcohol use: Not Currently   Drug use: No   Sexual activity: Yes  Other Topics Concern   Not on file  Social History Narrative   Married.  34 yo son.   Wife on disability for bipolar disorder.     Social Determinants of Health   Financial Resource Strain: Not on file  Food Insecurity: Not on file  Transportation Needs: Not on file  Physical Activity: Not on file  Stress: Not on file  Social Connections: Not on file  Intimate Partner Violence: Not on file      Review of Systems  Constitutional:  Positive for fatigue. Negative for chills and unexpected weight change.  HENT:  Negative for congestion, postnasal drip, rhinorrhea, sneezing and sore throat.   Eyes:  Negative for redness.  Respiratory:  Negative for cough, chest tightness and shortness of breath.   Cardiovascular:  Negative for chest pain and palpitations.  Gastrointestinal:  Positive for anal bleeding and blood in stool. Negative for abdominal pain, constipation, diarrhea, nausea and vomiting.  Genitourinary:  Negative for dysuria and frequency.  Musculoskeletal:  Positive for arthralgias. Negative for back pain, joint swelling and neck pain.  Skin:  Negative for color change.  Neurological:  Negative for tremors.  Hematological:  Negative for adenopathy. Does not bruise/bleed easily.  Psychiatric/Behavioral:  Positive for sleep disturbance. Negative for behavioral problems (Depression) and suicidal ideas. The patient is nervous/anxious.     Vital Signs: BP 132/71   Pulse 76   Temp 98.4 F (36.9 C)   Resp 16   Ht '6\' 2"'$  (1.88 m)   Wt (!) 305 lb (138.3 kg)   SpO2 97%   BMI 39.16 kg/m    Physical Exam Vitals and nursing note reviewed.  Constitutional:      General: He is not in acute distress.    Appearance: Normal appearance. He is well-developed. He is obese. He is not diaphoretic.  HENT:      Head: Normocephalic and atraumatic.     Mouth/Throat:     Pharynx: No oropharyngeal exudate.  Eyes:     Pupils: Pupils are equal, round, and reactive to light.  Neck:  Thyroid: No thyromegaly.     Vascular: No JVD.     Trachea: No tracheal deviation.  Cardiovascular:     Rate and Rhythm: Normal rate and regular rhythm.     Heart sounds: Normal heart sounds. No murmur heard.    No friction rub. No gallop.  Pulmonary:     Effort: Pulmonary effort is normal. No respiratory distress.     Breath sounds: No wheezing or rales.  Chest:     Chest wall: No tenderness.  Abdominal:     General: Bowel sounds are normal.     Palpations: Abdomen is soft.  Musculoskeletal:     Cervical back: Normal range of motion and neck supple.     Right lower leg: No edema.     Left lower leg: No edema.     Comments: Decreased ROM of left shoulder, especially abduction  Lymphadenopathy:     Cervical: No cervical adenopathy.  Skin:    General: Skin is warm and dry.  Neurological:     Mental Status: He is alert and oriented to person, place, and time.     Cranial Nerves: No cranial nerve deficit.  Psychiatric:        Behavior: Behavior normal.        Thought Content: Thought content normal.        Judgment: Judgment normal.        Assessment/Plan: 1. Essential hypertension Stable, continue current medications  2. OSA (obstructive sleep apnea) Patient needs new machine, but need to confirm what settings needed and will move forward with cpap titration - Cpap titration; Future  3. Neuropathy involving both lower extremities Will increase to '300mg'$  capsule TID - gabapentin (NEURONTIN) 300 MG capsule; Take 1 capsule (300 mg total) by mouth 3 (three) times daily.  Dispense: 270 capsule; Refill: 1  4. Chronic left shoulder pain - Ambulatory referral to Orthopedic Surgery  5. Type 2 diabetes mellitus with hyperglycemia, unspecified whether long term insulin use (HCC) Doing well on mounjaro and  will continue. May need to titrate up further  6. Generalized anxiety disorder Unable to tolerate increased wellbutrin and will decrease back to '150mg'$  and continue other medications as before  7. Blood in stool Advised to stop NSAIDs and to contact his GI provider to schedule appt. Colonoscopy was recently updated in March 2023   General Counseling: [name] understanding of the findings of todays visit and agrees with plan of treatment. I have discussed any further diagnostic evaluation that may be needed or ordered today. We also reviewed his medications today. he has been encouraged to call the office with any questions or concerns that should arise related to todays visit.    Orders Placed This Encounter  Procedures   Ambulatory referral to Orthopedic Surgery   Cpap titration    Meds ordered this encounter  Medications   gabapentin (NEURONTIN) 300 MG capsule    Sig: Take 1 capsule (300 mg total) by mouth 3 (three) times daily.    Dispense:  270 capsule    Refill:  1   Zoster Vaccine Adjuvanted [facility]) injection    Sig: Inject 0.5 mLs into the muscle once for 1 dose.    Dispense:  0.5 mL    Refill:  0    This patient was seen by Drema Dallas, PA-C in collaboration with Dr. Clayborn Bigness as a part of collaborative care agreement.   Total time spent:35 Minutes Time spent includes review of chart, medications, test results, and follow  up plan with the patient.      Dr Lavera Guise Internal medicine

## 2021-10-29 LAB — PSA: Prostate Specific Ag, Serum: 5.7 ng/mL — ABNORMAL HIGH (ref 0.0–4.0)

## 2021-10-29 LAB — TESTOSTERONE: Testosterone: 139 ng/dL — ABNORMAL LOW (ref 264–916)

## 2021-10-29 LAB — LUTEINIZING HORMONE: LH: 4.6 m[IU]/mL (ref 1.7–8.6)

## 2021-10-30 ENCOUNTER — Telehealth: Payer: Self-pay | Admitting: Physician Assistant

## 2021-10-30 NOTE — Procedures (Signed)
[place]  Portable Polysomnogram Report Part 1 Phone: [phone] Fax: [phone]  Patient Name: Gerald [name], Gerald [name] Recording Device: Glee Arvin  D.O.B.: 03-Sep-1965 Acquisition Number: [id number]  Referring Physician: Ander Purpura McDonough,PA-C  Acquisition Date: 10/09/2021   History: The patient is a 56 years old male who was referred for evaluation of possible sleep apnea.  Medical History: depression, hyperlipidemia, hypertension.  Medications: amlodipine, bupropion, buspirone, citalopram, doxycycline, furosemide, gabapentin, lisinpril-HCTZ.  PROCEDURE  The unattended portable polysomnogram was conducted on the night of 10/09/2021.  The following parameters were monitored: Nasal and oral airflow, and body position. Additionally, thoracic and abdominal movements were recorded by inductance plethysmography. Oxygen saturation (SpO2) and heart rate (ECG) was monitored using a pulse Oximeter.  The tracing was scored using 30 second epochs. Hypopneas were scored per AASM definition VIII4.B (3% desaturation).    Description: The total recording time was 253.5 minutes. Sleep parameters are not recorded.  Respiratory monitoring demonstrated significant snoring across the night in all positions. There were a total of 82 apneas and hypopneas for a Respiratory Event Index of 19.8 apneas and hypopneas per hour of recording. Most of the respiratory events occurred in the supine position with an REI of 54.8. The average duration of the respiratory events was 27.1 seconds with a maximum duration of 53.0 seconds. The respiratory events were associated with peripheral oxygen desaturations on the average to 92 %. The lowest oxygen desaturation associated with a respiratory event was 71 %. Additionally, the mean oxygen saturation was 92 %. The total duration of oxygen < 90% was 28.4 minutes and <80% was 1.8 minutes.   Cardiac monitoring- The average heart rate during the recording  was 74.5 bpm.  Impression: This routine overnight portable polysomnogram demonstrated the presence of obstructive sleep apnea. Overall the Respiratory Event Index was 19.8 apneas and hypopneas per hour of recording with the lowest desaturation to 71 %. Most of the respiratory events occurred in the supine position with an REI of 54.8  Recommendations:     A CPAP titration would be recommended due to the severity of the sleep apnea. Some supine sleep should be ensured to optimize the titration. Would recommend weight loss in a patient with a BMI of 40.5 lb/in2.  Allyne Gee, MD [facility] Diplomate ABMS Pulmonary Critical Care and Sleep Medicine Electronically reviewed and digitally signed   [place]  Portable Polysomnogram Report Part 2 Phone: [phone] Fax: [phone]   Study Date: 10/09/2021  Patient Name: Gerald [name], Gerald [name] Recording Device: Glee Arvin  Sex: M Height: 72.0 in.  D.O.B.: 12/30/1965 Weight: 299.0 lbs.  Age: 9 years B.M.I: 40.5 lb/in2   Times and Durations  Lights off clock time:  4:03:32 AM Total Recording Time (TRT): 253.5 minutes  Lights on clock time: 8:17:02 AM Time In Bed (TIB): 253.5 minutes   Summary  AHI 19.8 OAI 4.3 CAI 0.0 Lowest Desat 71  AHI is the number of apneas and hypopneas per hour. OAI is the number of obstructive apneas per hour. CAI is the number of central apneas per hour. Lowest Desat is the lowest blood oxygen level that lasted at least 2 seconds.  RESPIRATORY EVENTS   Index (#/hour) Total # of Events Mean duration  (sec) Max duration  (sec) # of Events by Position       Supine Prone Left Right Up  Central Apneas 0.0 0 0.0 0.0 0    0 0 0  Obstructive Apneas 4.3 18 21.5 45.0  15    3 0 0  Mixed Apneas 0.2 1 15.0 15.0 1    0 0 0  Hypopneas 15.2 63 28.9 53.0 50    7 6 0  Apneas + Hypopneas 19.8 82 27.1 53.0 66    10 6 0  Total 19.8 82 27.1 53.0 66    10 6 0  Time in Position 72.4    83.7 92.4 2.8  AHI in Position 54.8     7.2 3.9 0.0    Oximetry Summary   Dur. (min) % TIB  <90 % 28.4 11.2  <85 % 6.1 2.4  <80 % 1.8 0.7  <70 % 0.0 0.0  Total Dur (min) < 89 17.6 min  Average (%) 92  Total # of Desats 128  Desat Index (#/hour) 31.1  Desat Max (%) 19  Desat Max dur (sec) 65.0  Lowest SpO2 % during sleep 71  Duration of Min SpO2 (sec) 6    Heart Rate Stats  Mean HR during sleep (BPM)  Highest HR during sleep 91  (BPM)  Highest HR during TIB  115 (BPM)    Snoring Summary  Total Snoring Episodes 47  Total Duration with Snoring 6.5 minutes  Mean Duration of Snoring 8.3 seconds  Percentage of Snoring 2.6 %

## 2021-10-30 NOTE — Telephone Encounter (Signed)
Orthopedic Surgery sent via [facility] to Dr. Tamera Punt @ [place] [facility]

## 2021-10-30 NOTE — Telephone Encounter (Signed)
SS order placed in [facility] folder-Toni 

## 2021-11-06 ENCOUNTER — Ambulatory Visit: Payer: 59

## 2021-11-07 ENCOUNTER — Ambulatory Visit (INDEPENDENT_AMBULATORY_CARE_PROVIDER_SITE_OTHER): Payer: 59 | Admitting: Urology

## 2021-11-07 ENCOUNTER — Encounter: Payer: Self-pay | Admitting: Urology

## 2021-11-07 VITALS — BP 135/84 | HR 82 | Ht 74.0 in | Wt 294.0 lb

## 2021-11-07 DIAGNOSIS — N529 Male erectile dysfunction, unspecified: Secondary | ICD-10-CM | POA: Diagnosis not present

## 2021-11-07 DIAGNOSIS — Z125 Encounter for screening for malignant neoplasm of prostate: Secondary | ICD-10-CM | POA: Diagnosis not present

## 2021-11-07 DIAGNOSIS — E291 Testicular hypofunction: Secondary | ICD-10-CM

## 2021-11-07 DIAGNOSIS — R972 Elevated prostate specific antigen [PSA]: Secondary | ICD-10-CM

## 2021-11-07 NOTE — Patient Instructions (Signed)
Prostate Biopsy Instructions  Stop all aspirin or blood thinners (aspirin, plavix, coumadin, warfarin, motrin, ibuprofen, advil, aleve, naproxen, naprosyn) for 7 days prior to the procedure.  If you have any questions about stopping these medications, please contact your primary care physician or cardiologist.  Having a light meal prior to the procedure is recommended.  If you are diabetic or have low blood sugar please bring a small snack or glucose tablet.  A Fleets enema is needed to be purchased over the counter at a local pharmacy and used 2 hours before you scheduled appointment.  This can be purchased over the counter at any pharmacy.  Antibiotics will be administered in the clinic at the time of the procedure unless otherwise specified.    Please bring someone with you to the procedure to drive you home.  A follow up appointment has been scheduled for you to receive the results of the biopsy.  If you have any questions or concerns, please feel free to call the office at [phone] or send a Mychart message.    Thank you, Staff at [facility]   Transrectal Ultrasound-Guided Prostate Biopsy A transrectal ultrasound-guided prostate biopsy is a procedure to remove samples of prostate tissue for testing. The prostate is a walnut-sized gland that is located below the bladder and in front of the rectum. During this procedure, a small device (probe) is lubricated and put inside the rectum. The probe sends out sound waves that make a picture of the prostate and surrounding tissues (transrectal ultrasound). The images are used to help guide the process of removing the samples. The samples are taken to a lab to be checked for prostate cancer. This procedure is usually done to evaluate the prostate gland of men who have raised (elevated) levels of prostate-specific antigen (PSA), which can be a sign of prostate cancer or prostate enlargement related to aging (benign prostatic  hyperplasia, or BPH). Tell a health care provider about: Any allergies you have. All medicines you are taking, including vitamins, herbs, eye drops, creams, and over-the-counter medicines. Any problems you or family members have had with anesthetic medicines. Any bleeding problems you have. Any surgeries you have had. Any medical conditions you have. Any prostate infections you have had. What are the risks? Generally, this is a safe procedure. However, problems may occur, including: Prostate infection. Bleeding from the rectum. Blood in the urine. Allergic reactions to medicines. Damage to surrounding structures such as blood vessels, organs, or muscles. Difficulty passing urine. Nerve damage. This is usually temporary. What happens before the procedure? Medicines Ask your health care provider about: Changing or stopping your regular medicines. This is especially important if you are taking diabetes medicines or blood thinners. Taking medicines such as aspirin and ibuprofen. These medicines can thin your blood. Do not take these medicines unless your health care provider tells you to take them. Taking over-the-counter medicines, vitamins, herbs, and supplements. General instructions Follow instructions from your health care provider about eating and drinking. In most instances, you will not need to stop eating and drinking completely before the procedure. You will be given an enema. During an enema, a liquid is injected into your rectum to clear out waste. You may have a blood or urine sample taken. Ask your health care provider what steps will be taken to help prevent infection. These steps may include: Washing skin with a germ-killing soap. Taking antibiotic medicine. If you will be going home right after the procedure, plan to have a responsible   adult: Take you home from the hospital or clinic. You will not be allowed to drive. Care for you for the time you are told. What happens  during the procedure?  An IV will be inserted into one of your veins. You will be given one or both of the following: A medicine to help you relax (sedative). A medicine to numb the area (local anesthetic). You will be placed on your left side, and your knees will be bent toward your chest. A probe with lubricated gel will be placed into your rectum, and images will be taken of your prostate and surrounding structures. Numbing medicine will be injected into your prostate. A biopsy needle will be inserted through your rectum or perineum and guided to your prostate using the ultrasound images. Prostate tissue samples will be removed, and the needle and probe will then be removed. The biopsy samples will be sent to a lab to be tested. The procedure may vary among health care providers and hospitals. What happens after the procedure? Your blood pressure, heart rate, breathing rate, and blood oxygen level will be monitored until you leave the hospital or clinic. You may have some discomfort in the rectal area. You will be given pain medicine as needed. If you were given a sedative during the procedure, it can affect you for several hours. Do not drive or operate machinery until your health care provider says that it is safe. It is up to you to get the results of your procedure. Ask your health care provider, or the department that is doing the procedure, when your results will be ready. Keep all follow-up visits. This is important. Summary A transrectal ultrasound-guided biopsy removes samples of tissue from your prostate using ultrasound-guided sound waves to help guide the process. This procedure is usually done to evaluate the prostate gland of men who have raised (elevated) levels of prostate-specific antigen (PSA), which can be a sign of prostate cancer or prostate enlargement related to aging. After your procedure, you may feel some discomfort in the rectal area. Plan to have a responsible  adult take you home from the hospital or clinic, and follow up with your health care provider for your results. This information is not intended to replace advice given to you by your health care provider. Make sure you discuss any questions you have with your health care provider. Document Revised: 07/16/2020 Document Reviewed: 07/16/2020 Elsevier Patient Education  2023 [employer]vier Inc.  

## 2021-11-07 NOTE — Progress Notes (Signed)
   11/07/2021 3:22 PM   Gerald [name] December 04, 1965 [id number]  Reason for visit: Follow up low testosterone, ED, scrotal pain, elevated PSA  HPI: 56 year old comorbid male with a number of medical issues including obesity with BMI of 38, diabetes and peripheral neuropathy on gabapentin, CAD with history of stent placement, depression on SSRI.  He has nitrates on his med list, but has not used these in over a year.  At our last visit, I recommended a trial of sildenafil 50 to 100 mg on demand for ED.  He reports no improvement despite the max dose of sildenafil.  Testosterone was low at 118, and on repeat testing per the AUA guideline recommendations, remain low at 139, and LH of 4.6.  PSA was repeated and had increased to 5.7 from 1.7 last year.  We discussed the new elevated PSA, as well as the relationship between testosterone and PSA.  Prior to considering any testosterone replacement, further evaluation needed for the elevated PSA.  I recommended starting with a repeat PSA reflex to free today, and if this is decreased to normal can consider Clomid for the low testosterone.  He would like to avoid testosterone injections if possible.  If PSA remains elevated, will need prostate biopsy.  -PSA, reflex to free today -If elevated, schedule prostate biopsy -If returns to normal, can trial Clomid 25 mg daily with repeat testosterone in 1 month   [name], [place] [address] [place], [place] [address] [phone]

## 2021-11-08 ENCOUNTER — Telehealth: Payer: Self-pay

## 2021-11-08 ENCOUNTER — Telehealth: Payer: Self-pay | Admitting: Physician Assistant

## 2021-11-08 LAB — PSA, TOTAL AND FREE
PSA, Free Pct: 11.5 %
PSA, Free: 0.38 ng/mL
Prostate Specific Ag, Serum: 3.3 ng/mL (ref 0.0–4.0)

## 2021-11-08 NOTE — Telephone Encounter (Signed)
-----   Message from [name], MD sent at 11/08/2021 12:59 PM EDT ----- Good news, PSA back down to normal, no need for prostate biopsy.  Please start Clomid 25 mg daily as discussed in clinic to increase his testosterone.  RTC with me 6 weeks with a morning testosterone value prior, thanks  Nickolas Madrid, MD 11/08/2021

## 2021-11-08 NOTE — Telephone Encounter (Signed)
Orthopedic appointment 11/27/21 with  [facility]

## 2021-11-08 NOTE — Telephone Encounter (Signed)
Called and spoke with patient regarding his lab results and informed pt the will not have the prostate bx , pt will  start medication Clomid 25 mg as discuss , pt understood, and made an appt for pt follow up in 6 week with lab  and follow up. Pt needed morning appt due to his work schedule pt will come here to have his lab done and follow up  in the [place] clinic.

## 2021-11-11 ENCOUNTER — Other Ambulatory Visit: Payer: Self-pay

## 2021-11-11 ENCOUNTER — Ambulatory Visit (INDEPENDENT_AMBULATORY_CARE_PROVIDER_SITE_OTHER): Payer: 59 | Admitting: Physician Assistant

## 2021-11-11 ENCOUNTER — Encounter: Payer: Self-pay | Admitting: Physician Assistant

## 2021-11-11 ENCOUNTER — Telehealth: Payer: Self-pay | Admitting: Physician Assistant

## 2021-11-11 ENCOUNTER — Telehealth: Payer: Self-pay | Admitting: Urology

## 2021-11-11 DIAGNOSIS — H9192 Unspecified hearing loss, left ear: Secondary | ICD-10-CM | POA: Diagnosis not present

## 2021-11-11 DIAGNOSIS — H6122 Impacted cerumen, left ear: Secondary | ICD-10-CM | POA: Diagnosis not present

## 2021-11-11 DIAGNOSIS — E1165 Type 2 diabetes mellitus with hyperglycemia: Secondary | ICD-10-CM | POA: Diagnosis not present

## 2021-11-11 MED ORDER — TIRZEPATIDE 7.5 MG/0.5ML ~~LOC~~ SOAJ: 7.5000 mg | SUBCUTANEOUS | 2 refills | Status: DC

## 2021-11-11 MED ORDER — CLOMIPHENE CITRATE 50 MG PO TABS: 50.0000 mg | ORAL_TABLET | Freq: Every day | ORAL | 11 refills | Status: DC

## 2021-11-11 NOTE — Telephone Encounter (Signed)
Please advise 

## 2021-11-11 NOTE — Telephone Encounter (Signed)
Sent cl[facility] to [facility], also rescheduled pt to [place] office he lives closer to here.

## 2021-11-11 NOTE — Telephone Encounter (Signed)
Patient called and stated that Dr. Diamantina Providence & himself had discussed a Testosterone prescription. [facility] stated that [facility] told [facility]. He would [facility]  Store ID: [id number] [address], [place], [place] [address] -lmr.

## 2021-11-11 NOTE — Telephone Encounter (Signed)
APAP verification still pending per Helene Kelp w/ [facility]

## 2021-11-11 NOTE — Progress Notes (Signed)
[facility] [place], [place] [address]  Internal MEDICINE  Office Visit Note  Patient Name: Gerald [name]  [id number]  [id number]  Date of Service: 11/15/2021  Chief Complaint  Patient presents with   Acute Visit    Can't hear out of ear. L ear      HPI Pt is here for a sick visit. -left ear suddenly couldn't hear last night after falling asleep on the couch. States it isnt completely gone but very muffled -unaware of getting any water in ear -no pain or sinus pressure -On exam left ear completely occluded with wax and was irrigated in office. Pt reports improved hearing once irrigation completed.   Current Medication:  Outpatient Encounter Medications as of 11/11/2021  Medication Sig   tirzepatide (MOUNJARO) 7.5 MG/0.5ML Pen Inject 7.5 mg into the skin once a week.   acetaminophen (TYLENOL) 500 MG tablet Take 2 tablets (1,000 mg total) by mouth every 6 (six) hours as needed for mild pain.   amLODipine (NORVASC) 5 MG tablet TAKE 1 TABLET (5 MG TOTAL) BY MOUTH DAILY.   buPROPion (WELLBUTRIN XL) 150 MG 24 hr tablet Take 150 mg by mouth daily.   busPIRone (BUSPAR) 5 MG tablet Take 1 tablet (5 mg total) by mouth 2 (two) times daily.   citalopram (CELEXA) 40 MG tablet TAKE 1 TABLET BY MOUTH EVERY DAY FOR GAD   furosemide (LASIX) 20 MG tablet TAKE 1 TABLET BY MOUTH EVERY DAY AS NEEDED FOR LEG SWELLING   gabapentin (NEURONTIN) 300 MG capsule Take 1 capsule (300 mg total) by mouth 3 (three) times daily.   lisinopril-hydrochlorothiazide (ZESTORETIC) 20-25 MG tablet TAKE 1 TABLET BY MOUTH EVERY DAY   Multiple Vitamins-Minerals (MULTIVITAMIN ADULTS PO) Take 1 tablet by mouth daily.   nitroGLYCERIN (NITROSTAT) 0.4 MG SL tablet Place 1 tablet (0.4 mg total) under the tongue every 5 (five) minutes x 3 doses as needed for chest pain.   sildenafil (VIAGRA) 50 MG tablet Take 1-2 tablets (50-100 mg total) by mouth daily as needed for erectile dysfunction (take at  least 30 minutes prior to sexual activity on an empty stomach).   [DISCONTINUED] tirzepatide [facility]) 5 MG/0.5ML Pen Inject 5 mg into the skin once a week.   No facility-administered encounter medications on file as of 11/11/2021.      Medical History: Past Medical History:  Diagnosis Date   Abnormal LFTs    Anxiety    Atypical chest pain    Borderline diabetes    CAD (coronary artery disease)    a.) PCI and stent placement (unknown type) to mLAD and mLCx; date unknown. b.) LHC 09/10/2010: 40% mLAD; no intervention. c.) LHC 09/12/2011: EF 60%; 30% ISR mLAD, 40% dLAD, 30% pLCx, 30% ISR mLCx, 40% mRCA; no intervention. d.) LHC 05/23/2014: EF >55%; 30% p-mLAD; no intervention. e.) LHC 06/11/2016: EF 55-65%; LVEDP norm; 30-40% p-mLAD; no intervention.   Carcinoma ([place])    Chronic pain syndrome    Depression    Fundic gland polyps of stomach, benign    Gastritis    GERD (gastroesophageal reflux disease)    Hepatic steatosis    Hyperlipidemia    Hypertension    Migraines    Myocardial infarction [facility]) 2008   was told by PCP   Nonischemic cardiomyopathy ([place])    a.) TTE 09/12/2011: EF 35-45%. b.) LHC 09/12/2011: EF 60%. c.) TTE 06/04/2012: EF 55-60%. d.) LHC 05/23/2014: EF >55%. e.) TTE 06/11/2016: EF 55-60%; G1DD. f.) LHC 06/11/2016: EF  55-65%. g.) TTE 01/08/2018: EF 60-65%.   Obesity    Occlusion and stenosis of bilateral carotid arteries    OSA on CPAP    Osteoarthritis of both knees    Shortness of breath    Squamous cell carcinoma of skin 02/27/2020   left distal lat deltoid - EDC     Vital Signs: BP 132/78 Comment: 142/77  Pulse 76   Temp 98.1 F (36.7 C)   Resp 16   Ht '6\' 2"'$  (1.88 m)   Wt (!) 309 lb 3.2 oz (140.3 kg)   SpO2 98%   BMI 39.70 kg/m    Review of Systems  Constitutional:  Negative for fatigue and fever.  HENT:  Positive for hearing loss. Negative for congestion, ear discharge, ear pain, mouth sores and postnasal drip.   Respiratory:  Negative for  cough.   Cardiovascular:  Negative for chest pain.  Genitourinary:  Negative for flank pain.  Psychiatric/Behavioral: Negative.      Physical Exam Vitals and nursing note reviewed.  Constitutional:      Appearance: Normal appearance. He is obese.  HENT:     Right Ear: Tympanic membrane normal. There is no impacted cerumen.     Left Ear: There is impacted cerumen.  Cardiovascular:     Rate and Rhythm: Normal rate and regular rhythm.  Pulmonary:     Effort: Pulmonary effort is normal.     Breath sounds: Normal breath sounds.  Skin:    General: Skin is warm and dry.  Neurological:     General: No focal deficit present.     Mental Status: He is alert.  Psychiatric:        Mood and Affect: Mood normal.        Behavior: Behavior normal.        Thought Content: Thought content normal.        Judgment: Judgment normal.       Assessment/Plan: 1. Hearing decreased, left Sudden decrease in hearing after sleeping. Discussed this can be an emergent situation however in his case is likely due to significant cerumen impaction as it is improving already right after irrigation. Will monitor to ensure back to normal once remnant water from irrigation gone  2. Impacted cerumen of left ear - Ear Lavage  3. Type 2 diabetes mellitus with hyperglycemia, unspecified whether long term insulin use (HCC) Will increase to next dose once '5mg'$  pen completed. - tirzepatide (MOUNJARO) 7.5 MG/0.5ML Pen; Inject 7.5 mg into the skin once a week.  Dispense: 2 mL; Refill: 2   General Counseling: Gerald [name] verbalizes understanding of the findings of todays visit and agrees with plan of treatment. I have discussed any further diagnostic evaluation that may be needed or ordered today. We also reviewed his medications today. he has been encouraged to call the office with any questions or concerns that should arise related to todays visit.    Counseling:    Orders Placed This Encounter  Procedures   Ear  Lavage    Meds ordered this encounter  Medications   tirzepatide (MOUNJARO) 7.5 MG/0.5ML Pen    Sig: Inject 7.5 mg into the skin once a week.    Dispense:  2 mL    Refill:  2    Time spent:30 Minutes

## 2021-11-12 ENCOUNTER — Telehealth: Payer: Self-pay | Admitting: Urology

## 2021-11-12 NOTE — Telephone Encounter (Signed)
Patient called and stated that his insurance does not cover Clomid, and it would be too expensive for him. He is asking if there is something else that can be called into pharmacy in place of that.

## 2021-11-12 NOTE — Telephone Encounter (Signed)
Spoke with patient and scheduled appt

## 2021-11-12 NOTE — Telephone Encounter (Signed)
Spoke with patient and he was quoted $100.00 for Clomid, Goodrx and Singlecare just about the same price. Pt does not want to do the injections, but if it's cheaper he will try. Please advise

## 2021-11-13 ENCOUNTER — Telehealth: Payer: Self-pay | Admitting: Physician Assistant

## 2021-11-13 NOTE — Telephone Encounter (Signed)
Cpap titration was denied by insurance per feeling great; pt will be issued apap machine.tat

## 2021-11-19 ENCOUNTER — Telehealth: Payer: Self-pay

## 2021-11-19 NOTE — Telephone Encounter (Signed)
Patient called and left [place] a voicemail to give him a call. Called patient and he stated that for the past 1.5 weeks he has had sever mid-abdominal pain and nausea every time he eats and has noticed black stools. Patient denied, fever, chills, vomiting and constipation. Patient stated that he was not sure if it was his gallbladder causing this or not. He has not tried anything to make it better. Patient wanted to know if he could be seen soon since he can't bare the pain. Please advise.

## 2021-11-19 NOTE — Telephone Encounter (Signed)
Called patient back and he stated that he has literally noticed red bright blood in his stools and in the toilet. The other reason that he had noticed black stools is because he has been taking Pepto Bismol which causes black stools. I told him to schedule an appointment to see his PCP or to go to Urgent care to be evaluated but that in the meantime, to stop taking any NSAIDs. Patient stated that he was not taking any type of PPIs. I scheduled him an appointment to see Dr. Vicente Males on 12/09/2021 at 2:15 PM. Patient agreed.

## 2021-11-19 NOTE — Progress Notes (Signed)
11/20/2021 2:03 PM   Gerald [name] 04-13-1965 [id number]  Referring provider: Mylinda Latina, PA-C [place],  [place] [address]  Urological history: 1. ED -contributing factors of age, testosterone deficiency, diabetes, depression, HTN, CAD, sleep apnea, HLD and anxiety -sildenafil 50 mg, on-demand-dosing  2. BPH w/ LU TS -PSA (11/2021) 3.3   Chief Complaint  Patient presents with   Hypogonadism    HPI: Gerald [name] is a 56 y.o. male who presents today for to discuss TRT options as he found the Clomid cost prohibitive.    He had been on testosterone cypionate many years ago, but he states he was given no direction on how the medication was to be administered and when he saw another doctor he was informed he was overdosing on the testosterone.  The other physician advised him to discontinue testosterone therapy at that time and he had not seek evaluation or treatment for it until recently.  He was prescribed Clomid, but his insurance would not cover it and it was cost prohibitive.  PMH: Past Medical History:  Diagnosis Date   Abnormal LFTs    Anxiety    Atypical chest pain    Borderline diabetes    CAD (coronary artery disease)    a.) PCI and stent placement (unknown type) to mLAD and mLCx; date unknown. b.) LHC 09/10/2010: 40% mLAD; no intervention. c.) LHC 09/12/2011: EF 60%; 30% ISR mLAD, 40% dLAD, 30% pLCx, 30% ISR mLCx, 40% mRCA; no intervention. d.) LHC 05/23/2014: EF >55%; 30% p-mLAD; no intervention. e.) LHC 06/11/2016: EF 55-65%; LVEDP norm; 30-40% p-mLAD; no intervention.   Carcinoma ([place])    Chronic pain syndrome    Depression    Fundic gland polyps of stomach, benign    Gastritis    GERD (gastroesophageal reflux disease)    Hepatic steatosis    Hyperlipidemia    Hypertension    Migraines    Myocardial infarction [facility]) 2008   was told by PCP   Nonischemic cardiomyopathy ([place])    a.) TTE 09/12/2011: EF 35-45%. b.) LHC 09/12/2011:  EF 60%. c.) TTE 06/04/2012: EF 55-60%. d.) LHC 05/23/2014: EF >55%. e.) TTE 06/11/2016: EF 55-60%; G1DD. f.) LHC 06/11/2016: EF 55-65%. g.) TTE 01/08/2018: EF 60-65%.   Obesity    Occlusion and stenosis of bilateral carotid arteries    OSA on CPAP    Osteoarthritis of both knees    Shortness of breath    Squamous cell carcinoma of skin 02/27/2020   left distal lat deltoid - [facility]    Surgical History: Past Surgical History:  Procedure Laterality Date   BONE EXCISION Right 11/21/2020   Procedure: PART EXCISION BONE- PHALANX;  Surgeon: Samara Deist, DPM;  Location: [place];  Service: Podiatry;  Laterality: Right;   CARDIAC CATHETERIZATION N/A 09/2011   [facility]; EF 50% with 30% mid LAD stenosis and no obstructive disease.   CARDIAC CATHETERIZATION  09/2010   [facility]; Mid LAD 40% stenosis; Mid Circumflex:Normal; Mid RCA; Normal   CARDIAC CATHETERIZATION     COLONOSCOPY     COLONOSCOPY WITH PROPOFOL N/A 04/10/2021   Procedure: COLONOSCOPY WITH PROPOFOL;  Surgeon: Jonathon Bellows, MD;  Location[facility] ENDOSCOPY;  Service: Gastroenterology;  Laterality: N/A;   ESOPHAGOGASTRODUODENOSCOPY N/A 04/10/2021   Procedure: ESOPHAGOGASTRODUODENOSCOPY (EGD);  Surgeon: Jonathon Bellows, MD;  Location: [facility] ENDOSCOPY;  Service: Gastroenterology;  Laterality: N/A;   ESOPHAGOGASTRODUODENOSCOPY (EGD) WITH PROPOFOL N/A 06/10/2017   Procedure: ESOPHAGOGASTRODUODENOSCOPY (EGD) WITH PROPOFOL;  Surgeon: Jonathon Bellows, MD;  Location: [facility] ENDOSCOPY;  Service: Gastroenterology;  Laterality: N/A;   HAMMER TOE SURGERY Right 11/21/2020   Procedure: HAMMERTOE CORRECTION;  Surgeon: Samara Deist, DPM;  Location: [place];  Service: Podiatry;  Laterality: Right;   INSERTION OF MESH  04/16/2021   Procedure: INSERTION OF MESH;  Surgeon: Olean Ree, MD;  Location: [facility] ORS;  Service: General;;   KNEE ARTHROSCOPY WITH MEDIAL MENISECTOMY Right 09/16/2012   Procedure: RIGHT KNEE ARTHROSCOPY WITH MEDIAL AND LATERAL MENISECTOMY,  CHONDROPLASTY;  Surgeon: Ninetta Lights, MD;  Location: [place];  Service: Orthopedics;  Laterality: Right;  RIGHT KNEE SCOPE MEDIAL MENISCECTOMY   LEFT HEART CATH AND CORONARY ANGIOGRAPHY N/A 06/11/2016   Procedure: Left Heart Cath and Coronary Angiography;  Surgeon: Minna Merritts, MD;  Location: [place] CV LAB;  Service: Cardiovascular;  Laterality: N/A;   UMBILICAL HERNIA REPAIR N/A 04/16/2021   Procedure: HERNIA REPAIR UMBILICAL ADULT, open;  Surgeon: Olean Ree, MD;  Location: [facility] ORS;  Service: General;  Laterality: N/A;    Home Medications:  Allergies as of 11/20/2021       Reactions   Statins Rash, Hives           Medication List        Accurate as of November 20, 2021  2:03 PM. If you have any questions, ask your nurse or doctor.          acetaminophen 500 MG tablet Commonly known as: TYLENOL Take 2 tablets (1,000 mg total) by mouth every 6 (six) hours as needed for mild pain.   amLODipine 5 MG tablet Commonly known as: NORVASC TAKE 1 TABLET (5 MG TOTAL) BY MOUTH DAILY.   buPROPion 150 MG 24 hr tablet Commonly known as: WELLBUTRIN XL Take 150 mg by mouth daily.   busPIRone 5 MG tablet Commonly known as: BUSPAR Take 1 tablet (5 mg total) by mouth 2 (two) times daily.   citalopram 40 MG tablet Commonly known as: CELEXA TAKE 1 TABLET BY MOUTH EVERY DAY FOR GAD   clomiPHENE 50 MG tablet Commonly known as: CLOMID Take 1 tablet (50 mg total) by mouth daily.   furosemide 20 MG tablet Commonly known as: LASIX TAKE 1 TABLET BY MOUTH EVERY DAY AS NEEDED FOR LEG SWELLING   gabapentin 300 MG capsule Commonly known as: NEURONTIN Take 1 capsule (300 mg total) by mouth 3 (three) times daily.   lisinopril-hydrochlorothiazide 20-25 MG tablet Commonly known as: ZESTORETIC TAKE 1 TABLET BY MOUTH EVERY DAY   MULTIVITAMIN ADULTS PO Take 1 tablet by mouth daily.   nitroGLYCERIN 0.4 MG SL tablet Commonly known as: NITROSTAT Place 1  tablet (0.4 mg total) under the tongue every 5 (five) minutes x 3 doses as needed for chest pain.   sildenafil 50 MG tablet Commonly known as: VIAGRA Take 1-2 tablets (50-100 mg total) by mouth daily as needed for erectile dysfunction (take at least 30 minutes prior to sexual activity on an empty stomach).   testosterone cypionate 200 MG/ML injection Commonly known as: DEPOTESTOSTERONE CYPIONATE Inject 1 mL (200 mg total) into the muscle every 14 (fourteen) days.   tirzepatide 7.5 MG/0.5ML Pen Commonly known as: MOUNJARO Inject 7.5 mg into the skin once a week.        Allergies:  Allergies  Allergen Reactions   Statins Rash and Hives         Family History: Family History  Problem Relation Age of Onset   Heart disease Father 49  MI   Heart attack Father    Stomach cancer Father    Hypertension Mother    Breast cancer Mother     Social History:  reports that he has never smoked. He has been exposed to tobacco smoke. He quit smokeless tobacco use about 36 years ago. He reports that he does not currently use alcohol. He reports that he does not use drugs.  ROS: Pertinent ROS in HPI  Physical Exam: BP 115/75   Pulse 88   Ht '6\' 2"'$  (1.88 m)   Wt 298 lb (135.2 kg)   BMI 38.26 kg/m   Constitutional:  Well nourished. Alert and oriented, No acute distress. HEENT: [place] AT, moist mucus membranes.  Trachea midline Cardiovascular: No clubbing, cyanosis, or edema. Respiratory: Normal respiratory effort, no increased work of breathing. Neurologic: Grossly intact, no focal deficits, moving all 4 extremities. Psychiatric: Normal mood and affect.  Laboratory Data: Lab Results  Component Value Date   WBC 8.0 09/10/2021   HGB 13.6 09/10/2021   HCT 40.4 09/10/2021   MCV 87 09/10/2021   PLT 239 09/10/2021    Lab Results  Component Value Date   CREATININE 0.88 09/10/2021   Lab Results  Component Value Date   TESTOSTERONE 139 (L) 10/28/2021    Lab Results   Component Value Date   HGBA1C 6.5 (H) 09/10/2021    Lab Results  Component Value Date   TSH 3.920 09/10/2021       Component Value Date/Time   CHOL 194 09/10/2021 0842   CHOL 205 (H) 04/01/2021 1335   HDL 35 (L) 09/10/2021 0842   HDL 34 (L) 09/13/2011 0307   CHOLHDL 6.6 01/07/2018 0554   VLDL 48 (H) 01/07/2018 0554   VLDL 27 09/13/2011 0307   LDLCALC 102 (H) 09/10/2021 0842   LDLCALC 115 (H) 09/13/2011 0307    Lab Results  Component Value Date   AST 16 09/10/2021   Lab Results  Component Value Date   ALT 26 09/10/2021  I have reviewed the labs.   Pertinent Imaging: N/A  Assessment & Plan:    1.  Testosterone deficiency -Near castrate testosterone levels -Significant symptoms fatigue -Recommend starting TRT -We discussed the most common forms of replacement including intramuscular injection and gels and he desires to start injections -Rx testosterone cypionate-200 mg every 2 weeks to start -Appointment will be made for injection training -Follow-up 6 weeks after starting TRT for testosterone level and symptom check -Potential side effects of testosterone replacement were discussed including stimulation of benign prostatic growth with lower urinary tract symptoms; erythrocytosis; edema; gynecomastia; worsening sleep apnea; venous thromboembolism; testicular atrophy and infertility. Recent studies suggesting an increased incidence of heart attack and stroke in patients taking testosterone was discussed. He was informed there is conflicting evidence regarding the impact of testosterone therapy on cardiovascular risk. The theoretical risk of growth stimulation of an undetected prostate cancer was also discussed.  He was informed that current evidence does not provide any definitive answers regarding the risks of testosterone therapy on prostate cancer and cardiovascular disease. The need for periodic monitoring of his testosterone level, PSA, hematocrit and DRE was  discussed.    Return for Tomorrow for testosterone injection teaching .  These notes generated with voice recognition software. I apologize for typographical errors.  Royden Purl  [place] [address]  [place], [place] [address] [phone]   I spent 30 minutes on the day of the encounter to include pre-visit  record review and face-to-face time with the patient

## 2021-11-19 NOTE — Telephone Encounter (Signed)
Maritza inform If he has black stools he would need to be evaluated by urgent care of pcp to ensure he is not bleeding . I am on vacation next week and can see him after I get back  . Ensure on a ppi , not on nsaids

## 2021-11-20 ENCOUNTER — Encounter: Payer: Self-pay | Admitting: Urology

## 2021-11-20 ENCOUNTER — Ambulatory Visit (INDEPENDENT_AMBULATORY_CARE_PROVIDER_SITE_OTHER): Payer: 59 | Admitting: Urology

## 2021-11-20 VITALS — BP 115/75 | HR 88 | Ht 74.0 in | Wt 298.0 lb

## 2021-11-20 DIAGNOSIS — E291 Testicular hypofunction: Secondary | ICD-10-CM

## 2021-11-20 DIAGNOSIS — E349 Endocrine disorder, unspecified: Secondary | ICD-10-CM

## 2021-11-20 MED ORDER — TESTOSTERONE CYPIONATE 200 MG/ML IM SOLN: 200.0000 mg | INTRAMUSCULAR | 0 refills | Status: DC

## 2021-11-20 NOTE — Progress Notes (Signed)
Gerald [name] is a 56 y.o.  male with testosterone deficiency who presents today for instruction on delivering an IM injection of testosterone cypionate.    I instructed the patient to identify the concentration of his testosterone. Testosterone for injection is usually in the form of testosterone cypionate. These liquids come in multiple concentrations, so before giving an injection, it's very important to make sure that his intended dosage takes into account the concentration of the testosterone serum. Usually, testosterone comes in a concentration of either 100 mg/ml or 200 mg/ml.  We typically use the 200 mg/mL in this office.    Using a sterile, 18 G needle and 3 cc syringe, the testosterone cypionate (lot # [id number] and exp. 2026/07) was drawn up for a 1.0 cc injection.  To draw up the dose, I demonstrated how to first draw air into the syringe equal to the volume of the dosage. Then, wipe the top of the medication bottle with an alcohol wipe, insert the needle through the lid and into the medication, and push the air from your syringe into the bottle. Turn the bottle upside down and draw out the exact dosage of testosterone.  I demonstrated how to aspirate the syringe by hinge the syringe with its needle uncapped and pointing up in front of him.  Looking for air bubbles in the syringe. Flick the side of the syringe to get these bubbles to rise to the top.    When the dosage is bubble-free, I slowly depressed the plunger to force the air at the top of the syringe out stopping when a tiny drop of medication comes out of the tip of the syringe.  I advised him to be certain no air remained in the syringe as injecting air is very dangerous.  Being careful not to squirt or spray a significant portion of the dosage onto the floor.  Preparing the injection site, outer middle third of the vastus lateralis muscle of the thigh, I took a sterile alcohol pad and wipe the immediate area around where I  intended him to inject.   I then demonstrated how to change the needle from the 18 G to the 21 G needle.  I then gave him the syringe for injecting.  He correctly identified the injection location.  He held the syringe like a dart at a 90-degree angle above the sterile injection site. Quickly plunged it into the flesh. Before depressing the plunger, he drew back on it slightly and no blood was seen.  I advised him that if blood flashed in the syringe, he would need to remove the needle and then select a different location as he was in the vein.  Inject the medication at a steady, controlled pace.  He fully depressed the plunger, slowly pull the needle out. Pressing around the injection site with a sterile cotton swab as he did so - this preventing the emerging needle from pulling on the skin and causing extra pain.  We assessed the needle entry point for bleeding, and applied a sterile Band-Aid and/or cotton swab if needed. Disposed of the used needle and syringe in a proper sharps container.  I advised him to acquire a sharps container for his personal use at home.  I advised him that If, after injection, he experienced redness, swelling, or discomfort beyond that of normal soreness at the site of injection, call our office for an appointment and instructions.  He is to always store his medication at the recommended temperature, and  always check the expiration date on the bottle. If it's expired, don't use it.  Of course, keep all of med's out of reach of children.  Do not change his dose without consulting your provider.  His starting dose will be 1 cc every 2 weeks.  He will return 1 week after his fourth injection for a testosterone level, hemoglobin and hematocrit.  Newell Frater, PA-C   I spent 15 minutes on the day of the encounter to include pre-visit record review, face-to-face time with the patient, and post-visit ordering of tests.

## 2021-11-21 ENCOUNTER — Ambulatory Visit (INDEPENDENT_AMBULATORY_CARE_PROVIDER_SITE_OTHER): Payer: 59 | Admitting: Urology

## 2021-11-21 DIAGNOSIS — E291 Testicular hypofunction: Secondary | ICD-10-CM | POA: Diagnosis not present

## 2021-11-21 DIAGNOSIS — E349 Endocrine disorder, unspecified: Secondary | ICD-10-CM

## 2021-11-21 MED ORDER — "BD DISP NEEDLES 21G X 1-1/2"" MISC": 1.0000 mg | 0 refills | Status: DC

## 2021-11-21 MED ORDER — SYRINGE 2-3 ML 3 ML MISC: 1.0000 mg | 3 refills | Status: DC

## 2021-11-21 MED ORDER — "BD DISP NEEDLES 18G X 1-1/2"" MISC": 1.0000 mg | 0 refills | Status: DC

## 2021-12-06 ENCOUNTER — Telehealth: Payer: Self-pay | Admitting: Physician Assistant

## 2021-12-06 NOTE — Telephone Encounter (Signed)
289 pages of MR mailed to: [facility] [address] [place] [address]---Toni

## 2021-12-09 ENCOUNTER — Other Ambulatory Visit: Payer: Self-pay

## 2021-12-09 ENCOUNTER — Ambulatory Visit (INDEPENDENT_AMBULATORY_CARE_PROVIDER_SITE_OTHER): Payer: 59 | Admitting: Gastroenterology

## 2021-12-09 ENCOUNTER — Encounter: Payer: Self-pay | Admitting: Gastroenterology

## 2021-12-09 VITALS — BP 149/83 | HR 80 | Temp 98.3°F | Wt 312.2 lb

## 2021-12-09 DIAGNOSIS — K253 Acute gastric ulcer without hemorrhage or perforation: Secondary | ICD-10-CM

## 2021-12-09 DIAGNOSIS — E8801 Alpha-1-antitrypsin deficiency: Secondary | ICD-10-CM | POA: Diagnosis not present

## 2021-12-09 DIAGNOSIS — K7460 Unspecified cirrhosis of liver: Secondary | ICD-10-CM

## 2021-12-09 DIAGNOSIS — K921 Melena: Secondary | ICD-10-CM | POA: Diagnosis not present

## 2021-12-09 MED ORDER — OMEPRAZOLE 40 MG PO CPDR: 40.0000 mg | DELAYED_RELEASE_CAPSULE | Freq: Every day | ORAL | 0 refills | Status: DC

## 2021-12-09 NOTE — Progress Notes (Signed)
Jonathon Bellows MD, MRCP(U.K) [address]  [place]  [place], [place] [address]  Main: [phone]  Fax: [phone]   Primary Care Physician: Mylinda Latina, PA-C  Primary Gastroenterologist:  Dr. Jonathon Bellows   Chief Complaint  Patient presents with   Hematochezia    HPI: Gerald [name] is a 56 y.o. male   Summary of history :  History referred and seen in February 2023 for abdominal pain after being seen in the ER in January 2023 with left flank pain.He states that he has had this pain in the left lower quadrant for a few months if not longer.  Worse when he strains, when having a bowel movement, when sitting up feels like a deep-seated pain then resolves on its own.  Has not felt a bulge in that area.  No family history of liver disease.  Last colonoscopy was over 10 to 15 years back.  Has intentionally been trying to lose weight.  No other complaints .  He has had 1 professional tattoo 4 years back.  No incarceration, no Armed forces logistics/support/administrative officer.  No illegal drug use.  No excess alcohol consumption.Underwent CT abdomen and pelvis and showed concern for hepatic steatosis and liver cirrhosis, inguinal hernias and no other cute cause for abdominal pain. Discharged     Interval history   03/30/2021-12/09/2021 He called [place] on 11/19/2021 and noted bright red blood in his stools and toilet and also some black stools while he was taking Pepto-Bismol.  Was advised to see his PCP or urgent care.   04/01/2021: [facility] FibroSure showed severe steatosis borderline or probable [place] in 1 and no fibrosis.  CK was very elevated at thousand 200 not immune to hepatitis A and hepatitis B autoimmune and viral hepatitis work-up was negative including celiac serology and ceruloplasmin alpha-1 antitrypsin levels were borderline low  04/10/2021: Colonoscopy 2 sessile polyps in the sigmoid colon 5 to 6 mm in size resected.  There was some hemorrhoids seen in the rectum internal.  04/10/2021: EGD: 1  nonbleeding superficial gastric ulcer seen in the gastric antrum 10 mm in size biopsies were taken few sessile polyps were seen in the greater curvature the stomach resected biopsies showed fundic gland polyps and the polyps in the colon were sessile serrated polyps.  09/10/2021 hemoglobin 13.6 CMP normal except elevated glucose HbA1c of 6.5. 04/24/2021 CT abdomen pelvis with contrast features of mesenteric panniculitis  He states that he has been having intermittent episodes of black stools.  Lower abdominal pain on and off nonradiating sharp in nature points to the area over his pubis.  Denies any NSAID use.  Denies any use of Pepto-Bismol.  Denies any constipation.  Denies any rectal bleeding.  Denies any change in his sleep habits.  Denies any memory issues.  Denies any alcohol use.  Denies any hematemesis.  Not on any PPI.  Current Outpatient Medications  Medication Sig Dispense Refill   acetaminophen (TYLENOL) 500 MG tablet Take 2 tablets (1,000 mg total) by mouth every 6 (six) hours as needed for mild pain.     amLODipine (NORVASC) 5 MG tablet TAKE 1 TABLET (5 MG TOTAL) BY MOUTH DAILY. 90 tablet 1   buPROPion (WELLBUTRIN XL) 150 MG 24 hr tablet Take 150 mg by mouth daily.     busPIRone (BUSPAR) 5 MG tablet Take 1 tablet (5 mg total) by mouth 2 (two) times daily. 180 tablet 1   citalopram (CELEXA) 40 MG tablet TAKE 1 TABLET BY MOUTH EVERY  DAY FOR GAD 90 tablet 1   clomiPHENE (CLOMID) 50 MG tablet Take 1 tablet (50 mg total) by mouth daily. 30 tablet 11   furosemide (LASIX) 20 MG tablet TAKE 1 TABLET BY MOUTH EVERY DAY AS NEEDED FOR LEG SWELLING 90 tablet 1   gabapentin (NEURONTIN) 300 MG capsule Take 1 capsule (300 mg total) by mouth 3 (three) times daily. 270 capsule 1   lisinopril-hydrochlorothiazide (ZESTORETIC) 20-25 MG tablet TAKE 1 TABLET BY MOUTH EVERY DAY 90 tablet 1   Multiple Vitamins-Minerals (MULTIVITAMIN ADULTS PO) Take 1 tablet by mouth daily.     NEEDLE, DISP, 18 G (BD DISP  NEEDLES) 18G X 1-1/2" MISC 1 mg by Does not apply route every 14 (fourteen) days. 50 each 0   NEEDLE, DISP, 21 G (BD DISP NEEDLES) 21G X 1-1/2" MISC 1 mg by Does not apply route every 14 (fourteen) days. 50 each 0   nitroGLYCERIN (NITROSTAT) 0.4 MG SL tablet Place 1 tablet (0.4 mg total) under the tongue every 5 (five) minutes x 3 doses as needed for chest pain. 30 tablet 0   sildenafil (VIAGRA) 50 MG tablet Take 1-2 tablets (50-100 mg total) by mouth daily as needed for erectile dysfunction (take at least 30 minutes prior to sexual activity on an empty stomach). 30 tablet 11   Syringe, Disposable, (2-3CC SYRINGE) 3 ML MISC 1 mg by Does not apply route every 14 (fourteen) days. 25 each 3   testosterone cypionate (DEPOTESTOSTERONE CYPIONATE) 200 MG/ML injection Inject 1 mL (200 mg total) into the muscle every 14 (fourteen) days. 10 mL 0   tirzepatide (MOUNJARO) 7.5 MG/0.5ML Pen Inject 7.5 mg into the skin once a week. 2 mL 2   No current facility-administered medications for this visit.    Allergies as of 12/09/2021 - Review Complete 12/09/2021  Allergen Reaction Noted   Statins Rash and Hives 04/15/2012    ROS:  General: Negative for anorexia, weight loss, fever, chills, fatigue, weakness. ENT: Negative for hoarseness, difficulty swallowing , nasal congestion. CV: Negative for chest pain, angina, palpitations, dyspnea on exertion, peripheral edema.  Respiratory: Negative for dyspnea at rest, dyspnea on exertion, cough, sputum, wheezing.  GI: See history of present illness. GU:  Negative for dysuria, hematuria, urinary incontinence, urinary frequency, nocturnal urination.  Endo: Negative for unusual weight change.    Physical Examination:   BP (!) 149/83   Pulse 80   Temp 98.3 F (36.8 C) (Oral)   Wt (!) 312 lb 3.2 oz (141.6 kg)   BMI 40.08 kg/m   General: Well-nourished, well-developed in no acute distress.  Eyes: No icterus. Conjunctivae pink. Mouth: Oropharyngeal mucosa moist  and pink , no lesions erythema or exudate. Abdomen: Bowel sounds are normal, nontender, nondistended, no hepatosplenomegaly or masses, no abdominal bruits or hernia , no rebound or guarding.   Extremities: No lower extremity edema. No clubbing or deformities. Neuro: Alert and oriented x 3.  Grossly intact. Skin: Warm and dry, no jaundice.   Psych: Alert and cooperative, normal mood and affect.   Imaging Studies: No results found.  Assessment and Plan:   JONY LADNIER is a 56 y.o. y/o male here to follow-up for recent diagnosis of liver cirrhosis. No risk factors for liver cirrhosis except possibly for fatty liver disease, likely nonalcoholic fatty liver disease.  Recent history of intermittent black stools concerning for an upper GI blood loss.  On prior upper endoscopy he had an gastric ulcer   Plan Liver cirrhosis : Likely NASH :  Alpha-1 antitrypsin level low previously we will recheck RUQ USG to screen for [facility] Weight loss, exercise, low salt diet, eat healthy, limit alcohol use H. pylori breath test in view of gastric ulcer seen previously, CBC in view of episode of rectal bleeding.  Commenced on omeprazole for 3 months 40 mg once a day and we will proceed with upper endoscopy evaluation I have discussed alternative options, risks & benefits,  which include, but are not limited to, bleeding, infection, perforation,respiratory complication & drug reaction.  The patient agrees with this plan & written consent will be obtained.        Dr Jonathon Bellows  MD,MRCP [facility]) Follow up in 3 months

## 2021-12-09 NOTE — Patient Instructions (Addendum)
Please arrive at 7:45 AM at the Outpatient imaging on 12/17/2021. Remember to not eat or drink after midnight the night before.  If this date and time does not work for you, please call [phone] to reschedule.

## 2021-12-12 LAB — COMPREHENSIVE METABOLIC PANEL
ALT: 27 IU/L (ref 0–44)
AST: 19 IU/L (ref 0–40)
Albumin/Globulin Ratio: 1.8 (ref 1.2–2.2)
Albumin: 4.6 g/dL (ref 3.8–4.9)
Alkaline Phosphatase: 91 IU/L (ref 44–121)
BUN/Creatinine Ratio: 11 (ref 9–20)
BUN: 11 mg/dL (ref 6–24)
Bilirubin Total: 0.6 mg/dL (ref 0.0–1.2)
CO2: 23 mmol/L (ref 20–29)
Calcium: 9.5 mg/dL (ref 8.7–10.2)
Chloride: 99 mmol/L (ref 96–106)
Creatinine, Ser: 1 mg/dL (ref 0.76–1.27)
Globulin, Total: 2.5 g/dL (ref 1.5–4.5)
Glucose: 130 mg/dL — ABNORMAL HIGH (ref 70–99)
Potassium: 3.9 mmol/L (ref 3.5–5.2)
Sodium: 140 mmol/L (ref 134–144)
Total Protein: 7.1 g/dL (ref 6.0–8.5)
eGFR: 89 mL/min/{1.73_m2} (ref >59)

## 2021-12-12 LAB — ALPHA-1-ANTITRYPSIN: A-1 Antitrypsin: 91 mg/dL — ABNORMAL LOW (ref 101–187)

## 2021-12-12 LAB — PROTIME-INR
INR: 1 (ref 0.9–1.2)
Prothrombin Time: 11.3 s (ref 9.1–12.0)

## 2021-12-12 LAB — AFP TUMOR MARKER: AFP, Serum, Tumor Marker: 3 ng/mL (ref 0.0–8.4)

## 2021-12-12 LAB — H. PYLORI BREATH TEST: H pylori Breath Test: NEGATIVE

## 2021-12-14 ENCOUNTER — Other Ambulatory Visit: Payer: Self-pay | Admitting: Physician Assistant

## 2021-12-14 ENCOUNTER — Other Ambulatory Visit: Payer: Self-pay | Admitting: Internal Medicine

## 2021-12-14 DIAGNOSIS — M7989 Other specified soft tissue disorders: Secondary | ICD-10-CM

## 2021-12-14 DIAGNOSIS — F411 Generalized anxiety disorder: Secondary | ICD-10-CM

## 2021-12-14 DIAGNOSIS — I10 Essential (primary) hypertension: Secondary | ICD-10-CM

## 2021-12-16 ENCOUNTER — Other Ambulatory Visit: Payer: Self-pay

## 2021-12-16 ENCOUNTER — Other Ambulatory Visit: Payer: Self-pay | Admitting: *Deleted

## 2021-12-16 ENCOUNTER — Other Ambulatory Visit: Payer: 59

## 2021-12-16 DIAGNOSIS — E349 Endocrine disorder, unspecified: Secondary | ICD-10-CM

## 2021-12-17 ENCOUNTER — Ambulatory Visit: Admission: RE | Admit: 2021-12-17 | Payer: 59 | Source: Ambulatory Visit

## 2021-12-17 LAB — HEMOGLOBIN AND HEMATOCRIT, BLOOD
Hematocrit: 46.1 % (ref 37.5–51.0)
Hemoglobin: 15.5 g/dL (ref 13.0–17.7)

## 2021-12-17 LAB — TESTOSTERONE: Testosterone: 906 ng/dL (ref 264–916)

## 2021-12-24 ENCOUNTER — Telehealth: Payer: Self-pay

## 2021-12-24 ENCOUNTER — Ambulatory Visit: Payer: 59 | Admitting: Urology

## 2021-12-24 NOTE — Telephone Encounter (Signed)
Ok per DPR, [facility] notifying pt of message.

## 2021-12-24 NOTE — Telephone Encounter (Signed)
-----   Message from Nori Riis, PA-C sent at 12/23/2021  4:49 PM EST ----- His blood work looks good.  I would continue with 1 cc every 14 days.  He has a follow up appointment with Dr. Diamantina [place] at the end of the month.  I will decide when to see him again after that appointment.

## 2021-12-30 ENCOUNTER — Encounter: Payer: Self-pay | Admitting: Physician Assistant

## 2021-12-30 ENCOUNTER — Ambulatory Visit (INDEPENDENT_AMBULATORY_CARE_PROVIDER_SITE_OTHER): Payer: 59 | Admitting: Physician Assistant

## 2021-12-30 VITALS — BP 132/78 | HR 85 | Temp 97.6°F | Resp 16 | Ht 74.0 in | Wt 317.2 lb

## 2021-12-30 DIAGNOSIS — G5793 Unspecified mononeuropathy of bilateral lower limbs: Secondary | ICD-10-CM | POA: Diagnosis not present

## 2021-12-30 DIAGNOSIS — Z6841 Body Mass Index (BMI) 40.0 and over, adult: Secondary | ICD-10-CM

## 2021-12-30 DIAGNOSIS — I10 Essential (primary) hypertension: Secondary | ICD-10-CM | POA: Diagnosis not present

## 2021-12-30 DIAGNOSIS — E1165 Type 2 diabetes mellitus with hyperglycemia: Secondary | ICD-10-CM | POA: Diagnosis not present

## 2021-12-30 LAB — POCT GLYCOSYLATED HEMOGLOBIN (HGB A1C): Hemoglobin A1C: 6.1 % — AB (ref 4.0–5.6)

## 2021-12-30 MED ORDER — TIRZEPATIDE 10 MG/0.5ML ~~LOC~~ SOAJ: 10.0000 mg | SUBCUTANEOUS | 0 refills | Status: DC

## 2021-12-30 NOTE — Progress Notes (Signed)
[facility] [place], [place] [address]  Internal MEDICINE  Office Visit Note  Patient Name: Gerald [name]  [id number]  [id number]  Date of Service: 12/30/2021  Chief Complaint  Patient presents with   Follow-up   Depression   Gastroesophageal Reflux   Hypertension   Hyperlipidemia   Quality Metric Gaps    Shingles Vaccine    HPI Pt is here for routine follow up -Saw ortho and was told he has a slight tear and frozen shoulder, said he was referred to PT but hasn't heard anything. He will follow back up with them -Taking gabapentin 1 in Am, 1 midday, and 2caps at night due to worsening burning in feet. -Some ankle swelling occasionally, wearing compression and taking lasix prn -Followed by urology for testosterone replacement and monitoring of PSA as he did have an elevated reading and then went back to normal -Some abdominal pain and constipated, no blood in stool anymore but was present. Has EGD scheduled with GI in a few days. Hx of ulcer. -Weight has been rising despite mounjaro. Would like to increase today, but may need to considering switching back to Trulicity if not helping weight with BG reduction  Current Medication: Outpatient Encounter Medications as of 12/30/2021  Medication Sig   acetaminophen (TYLENOL) 500 MG tablet Take 2 tablets (1,000 mg total) by mouth every 6 (six) hours as needed for mild pain.   amLODipine (NORVASC) 5 MG tablet TAKE 1 TABLET (5 MG TOTAL) BY MOUTH DAILY.   buPROPion (WELLBUTRIN XL) 150 MG 24 hr tablet Take 150 mg by mouth daily.   busPIRone (BUSPAR) 5 MG tablet TAKE 1 TABLET BY MOUTH TWICE A DAY   citalopram (CELEXA) 40 MG tablet TAKE 1 TABLET BY MOUTH EVERY DAY   clomiPHENE (CLOMID) 50 MG tablet Take 1 tablet (50 mg total) by mouth daily.   furosemide (LASIX) 20 MG tablet TAKE 1 TABLET BY MOUTH EVERY DAY AS NEEDED FOR LEG SWELLING   gabapentin (NEURONTIN) 300 MG capsule Take 1 capsule (300 mg total) by mouth 3  (three) times daily.   lisinopril-hydrochlorothiazide (ZESTORETIC) 20-25 MG tablet TAKE 1 TABLET BY MOUTH EVERY DAY   Multiple Vitamins-Minerals (MULTIVITAMIN ADULTS PO) Take 1 tablet by mouth daily.   NEEDLE, DISP, 18 G (BD DISP NEEDLES) 18G X 1-1/2" MISC 1 mg by Does not apply route every 14 (fourteen) days.   NEEDLE, DISP, 21 G (BD DISP NEEDLES) 21G X 1-1/2" MISC 1 mg by Does not apply route every 14 (fourteen) days.   nitroGLYCERIN (NITROSTAT) 0.4 MG SL tablet Place 1 tablet (0.4 mg total) under the tongue every 5 (five) minutes x 3 doses as needed for chest pain.   omeprazole (PRILOSEC) 40 MG capsule Take 1 capsule (40 mg total) by mouth daily.   sildenafil (VIAGRA) 50 MG tablet Take 1-2 tablets (50-100 mg total) by mouth daily as needed for erectile dysfunction (take at least 30 minutes prior to sexual activity on an empty stomach).   Syringe, Disposable, (2-3CC SYRINGE) 3 ML MISC 1 mg by Does not apply route every 14 (fourteen) days.   testosterone cypionate (DEPOTESTOSTERONE CYPIONATE) 200 MG/ML injection Inject 1 mL (200 mg total) into the muscle every 14 (fourteen) days.   tirzepatide (MOUNJARO) 10 MG/0.5ML Pen Inject 10 mg into the skin once a week.   [DISCONTINUED] tirzepatide (MOUNJARO) 7.5 MG/0.5ML Pen Inject 7.5 mg into the skin once a week.   No facility-administered encounter medications on file as of 12/30/2021.  Surgical History: Past Surgical History:  Procedure Laterality Date   BONE EXCISION Right 11/21/2020   Procedure: PART EXCISION BONE- PHALANX;  Surgeon: Samara Deist, DPM;  Location: [place];  Service: Podiatry;  Laterality: Right;   CARDIAC CATHETERIZATION N/A 09/2011   [facility]; EF 50% with 30% mid LAD stenosis and no obstructive disease.   CARDIAC CATHETERIZATION  09/2010   [facility]; Mid LAD 40% stenosis; Mid Circumflex:Normal; Mid RCA; Normal   CARDIAC CATHETERIZATION     COLONOSCOPY     COLONOSCOPY WITH PROPOFOL N/A 04/10/2021   Procedure: COLONOSCOPY  WITH PROPOFOL;  Surgeon: Jonathon Bellows, MD;  Location[facility] ENDOSCOPY;  Service: Gastroenterology;  Laterality: N/A;   ESOPHAGOGASTRODUODENOSCOPY N/A 04/10/2021   Procedure: ESOPHAGOGASTRODUODENOSCOPY (EGD);  Surgeon: Jonathon Bellows, MD;  Location: [facility] ENDOSCOPY;  Service: Gastroenterology;  Laterality: N/A;   ESOPHAGOGASTRODUODENOSCOPY (EGD) WITH PROPOFOL N/A 06/10/2017   Procedure: ESOPHAGOGASTRODUODENOSCOPY (EGD) WITH PROPOFOL;  Surgeon: Jonathon Bellows, MD;  Location: [facility] ENDOSCOPY;  Service: Gastroenterology;  Laterality: N/A;   HAMMER TOE SURGERY Right 11/21/2020   Procedure: HAMMERTOE CORRECTION;  Surgeon: Samara Deist, DPM;  Location: [place];  Service: Podiatry;  Laterality: Right;   INSERTION OF MESH  04/16/2021   Procedure: INSERTION OF MESH;  Surgeon: Olean Ree, MD;  Location: [facility] ORS;  Service: General;;   KNEE ARTHROSCOPY WITH MEDIAL MENISECTOMY Right 09/16/2012   Procedure: RIGHT KNEE ARTHROSCOPY WITH MEDIAL AND LATERAL MENISECTOMY, CHONDROPLASTY;  Surgeon: Ninetta Lights, MD;  Location: [place];  Service: Orthopedics;  Laterality: Right;  RIGHT KNEE SCOPE MEDIAL MENISCECTOMY   LEFT HEART CATH AND CORONARY ANGIOGRAPHY N/A 06/11/2016   Procedure: Left Heart Cath and Coronary Angiography;  Surgeon: Minna Merritts, MD;  Location: [place] CV LAB;  Service: Cardiovascular;  Laterality: N/A;   UMBILICAL HERNIA REPAIR N/A 04/16/2021   Procedure: HERNIA REPAIR UMBILICAL ADULT, open;  Surgeon: Olean Ree, MD;  Location: [facility] ORS;  Service: General;  Laterality: N/A;    Medical History: Past Medical History:  Diagnosis Date   Abnormal LFTs    Anxiety    Atypical chest pain    Borderline diabetes    CAD (coronary artery disease)    a.) PCI and stent placement (unknown type) to mLAD and mLCx; date unknown. b.) LHC 09/10/2010: 40% mLAD; no intervention. c.) LHC 09/12/2011: EF 60%; 30% ISR mLAD, 40% dLAD, 30% pLCx, 30% ISR mLCx, 40% mRCA; no intervention.  d.) LHC 05/23/2014: EF >55%; 30% p-mLAD; no intervention. e.) LHC 06/11/2016: EF 55-65%; LVEDP norm; 30-40% p-mLAD; no intervention.   Carcinoma ([place])    Chronic pain syndrome    Depression    Fundic gland polyps of stomach, benign    Gastritis    GERD (gastroesophageal reflux disease)    Hepatic steatosis    Hyperlipidemia    Hypertension    Migraines    Myocardial infarction [facility]) 2008   was told by PCP   Nonischemic cardiomyopathy ([place])    a.) TTE 09/12/2011: EF 35-45%. b.) LHC 09/12/2011: EF 60%. c.) TTE 06/04/2012: EF 55-60%. d.) LHC 05/23/2014: EF >55%. e.) TTE 06/11/2016: EF 55-60%; G1DD. f.) LHC 06/11/2016: EF 55-65%. g.) TTE 01/08/2018: EF 60-65%.   Obesity    Occlusion and stenosis of bilateral carotid arteries    OSA on CPAP    Osteoarthritis of both knees    Shortness of breath    Squamous cell carcinoma of skin 02/27/2020   left distal lat deltoid - [facility]    Family History: Family History  Problem Relation Age of Onset   Heart disease Father 87       MI   Heart attack Father    Stomach cancer Father    Hypertension Mother    Breast cancer Mother     Social History   Socioeconomic History   Marital status: Married    Spouse name: Not on file   Number of children: 3   Years of education: Not on file   Highest education level: Not on file  Occupational History   Occupation: Librarian, academic  Tobacco Use   Smoking status: Never    Passive exposure: Past   Smokeless tobacco: Former    Quit date: 1987  Scientific laboratory technician Use: Never used  Substance and Sexual Activity   Alcohol use: Not Currently   Drug use: No   Sexual activity: Yes  Other Topics Concern   Not on file  Social History Narrative   Married.  25 yo son.   Wife on disability for bipolar disorder.     Social Determinants of Health   Financial Resource Strain: Not on file  Food Insecurity: Not on file  Transportation Needs: Not on file  Physical Activity: Not on file  Stress: Not on  file  Social Connections: Not on file  Intimate Partner Violence: Not on file      Review of Systems  Constitutional:  Positive for fatigue and unexpected weight change. Negative for chills.  HENT:  Negative for congestion, postnasal drip, rhinorrhea, sneezing and sore throat.   Eyes:  Negative for redness.  Respiratory:  Negative for cough, chest tightness and shortness of breath.   Cardiovascular:  Negative for chest pain and palpitations.  Gastrointestinal:  Positive for abdominal pain and constipation. Negative for anal bleeding, diarrhea, nausea and vomiting.  Genitourinary:  Negative for dysuria and frequency.  Musculoskeletal:  Positive for arthralgias. Negative for back pain, joint swelling and neck pain.  Skin:  Negative for rash.  Neurological: Negative.  Negative for tremors and numbness.  Hematological:  Negative for adenopathy. Does not bruise/bleed easily.  Psychiatric/Behavioral:  Negative for behavioral problems (Depression), sleep disturbance and suicidal ideas.     Vital Signs: BP 132/78   Pulse 85   Temp 97.6 F (36.4 C)   Resp 16   Ht '6\' 2"'$  (1.88 m)   Wt (!) 317 lb 3.2 oz (143.9 kg)   SpO2 96%   BMI 40.73 kg/m    Physical Exam Vitals and nursing note reviewed.  Constitutional:      General: He is not in acute distress.    Appearance: Normal appearance. He is well-developed. He is obese. He is not diaphoretic.  HENT:     Head: Normocephalic and atraumatic.     Mouth/Throat:     Pharynx: No oropharyngeal exudate.  Eyes:     Pupils: Pupils are equal, round, and reactive to light.  Neck:     Thyroid: No thyromegaly.     Vascular: No JVD.     Trachea: No tracheal deviation.  Cardiovascular:     Rate and Rhythm: Normal rate and regular rhythm.     Heart sounds: Normal heart sounds. No murmur heard.    No friction rub. No gallop.  Pulmonary:     Effort: Pulmonary effort is normal. No respiratory distress.     Breath sounds: No wheezing or rales.   Chest:     Chest wall: No tenderness.  Abdominal:     General: Bowel sounds are  normal.     Palpations: Abdomen is soft.  Musculoskeletal:        General: Normal range of motion.     Cervical back: Normal range of motion and neck supple.     Right lower leg: No edema.     Left lower leg: No edema.  Lymphadenopathy:     Cervical: No cervical adenopathy.  Skin:    General: Skin is warm and dry.  Neurological:     Mental Status: He is alert and oriented to person, place, and time.     Cranial Nerves: No cranial nerve deficit.  Psychiatric:        Behavior: Behavior normal.        Thought Content: Thought content normal.        Judgment: Judgment normal.        Assessment/Plan: 1. Type 2 diabetes mellitus with hyperglycemia, unspecified whether long term insulin use (HCC) - POCT HgB A1C is 6.1 which is improved from 6.5 last visit. Will increase mounjaro to continue BG control and aid wt loss. If not improving may need to switch back to trulicity or alternative. - Urine Microalbumin w/creat. ratio - tirzepatide (MOUNJARO) 10 MG/0.5ML Pen; Inject 10 mg into the skin once a week.  Dispense: 6 mL; Refill: 0  2. Essential hypertension Stable, continue current medications  3. Neuropathy involving both lower extremities May continue gabapentin as before  4. Morbid obesity with BMI of 40.0-44.9, adult ([place]) Will work on diet and exercise and will increase mounjaro to aid BG control and wt loss.   General Counseling: hiram mciver understanding of the findings of todays visit and agrees with plan of treatment. I have discussed any further diagnostic evaluation that may be needed or ordered today. We also reviewed his medications today. he has been encouraged to call the office with any questions or concerns that should arise related to todays visit.    Orders Placed This Encounter  Procedures   Urine Microalbumin w/creat. ratio   POCT HgB A1C    Meds ordered this  encounter  Medications   tirzepatide (MOUNJARO) 10 MG/0.5ML Pen    Sig: Inject 10 mg into the skin once a week.    Dispense:  6 mL    Refill:  0    This patient was seen by Drema Dallas, PA-C in collaboration with Dr. [place] Bigness as a part of collaborative care agreement.   Total time spent:30 Minutes Time spent includes review of chart, medications, test results, and follow up plan with the patient.      Dr Lavera Guise Internal medicine

## 2021-12-31 ENCOUNTER — Telehealth: Payer: Self-pay

## 2021-12-31 LAB — MICROALBUMIN / CREATININE URINE RATIO
Creatinine, Urine: 122.2 mg/dL
Microalb/Creat Ratio: 6 mg/g creat (ref 0–29)
Microalbumin, Urine: 7.5 ug/mL

## 2021-12-31 NOTE — Telephone Encounter (Signed)
Pt advised take melatonin 3 or 5 mg as per lauren and pt notified

## 2022-01-01 ENCOUNTER — Encounter: Payer: Self-pay | Admitting: Gastroenterology

## 2022-01-01 ENCOUNTER — Encounter: Payer: Self-pay | Admitting: Certified Registered"

## 2022-01-01 ENCOUNTER — Encounter
Admission: RE | Disposition: A | Discharge status: Home / Self Care | Payer: Self-pay | Source: Home / Self Care | Attending: Gastroenterology

## 2022-01-01 ENCOUNTER — Other Ambulatory Visit: Payer: Self-pay

## 2022-01-01 ENCOUNTER — Ambulatory Visit
Admission: RE | Admit: 2022-01-01 | Discharge: 2022-01-01 | Disposition: A | Discharge status: Home / Self Care | Payer: 59 | Attending: Gastroenterology | Admitting: Gastroenterology

## 2022-01-01 DIAGNOSIS — K921 Melena: Secondary | ICD-10-CM

## 2022-01-01 DIAGNOSIS — Z539 Procedure and treatment not carried out, unspecified reason: Secondary | ICD-10-CM | POA: Diagnosis present

## 2022-01-01 HISTORY — PX: ESOPHAGOGASTRODUODENOSCOPY (EGD) WITH PROPOFOL: SHX5813

## 2022-01-01 SURGERY — ESOPHAGOGASTRODUODENOSCOPY (EGD) WITH PROPOFOL
Anesthesia: General

## 2022-01-01 MED ORDER — SODIUM CHLORIDE 0.9 % IV SOLN: INTRAVENOUS | Status: DC

## 2022-01-01 MED ORDER — PROPOFOL 10 MG/ML IV BOLUS
INTRAVENOUS | Status: AC
  Filled 2022-01-01: qty 20

## 2022-01-01 MED ORDER — PROPOFOL 1000 MG/100ML IV EMUL
INTRAVENOUS | Status: AC
  Filled 2022-01-01: qty 100

## 2022-01-01 NOTE — Progress Notes (Signed)
Called patient on 01/01/2022 since Dr. Vicente Males stated that he did not held his Gerald [name] (diabetic injection). I told him that he is not to inject his [name] this Sunday 01/05/2022 since he rescheduled for his EGD on 12//04/2021. Patient understood and had no questions.

## 2022-01-01 NOTE — OR Nursing (Signed)
Patient was not advised to hold mounjaro for 7 days prior to procedure, will reschedule

## 2022-01-01 NOTE — Addendum Note (Signed)
Addended by: Wayna Chalet on: 01/01/2022 08:38 AM   Modules accepted: Orders

## 2022-01-01 NOTE — Anesthesia Preprocedure Evaluation (Signed)
Anesthesia Evaluation  Patient identified by MRN, date of birth, ID band Patient awake    Reviewed: Allergy & Precautions, H&P , NPO status , Patient's Chart, lab work & pertinent test results, reviewed documented beta blocker date and time   Airway Mallampati: III  TM Distance: >3 FB Neck ROM: full    Dental  (+) Teeth Intact   Pulmonary neg pulmonary ROS, shortness of breath, sleep apnea    Pulmonary exam normal        Cardiovascular Exercise Tolerance: Poor hypertension, On Medications + CAD, + Past MI and + Orthopnea  negative cardio ROS Normal cardiovascular exam Rhythm:regular Rate:Normal     Neuro/Psych  Headaches PSYCHIATRIC DISORDERS Anxiety Depression    negative neurological ROS  negative psych ROS   GI/Hepatic negative GI ROS, Neg liver ROS,GERD  Medicated,,  Endo/Other  negative endocrine ROSdiabetes    Renal/GU negative Renal ROS  negative genitourinary   Musculoskeletal   Abdominal   Peds  Hematology negative hematology ROS (+)   Anesthesia Other Findings Past Medical History: No date: Abnormal LFTs No date: Anxiety No date: Atypical chest pain No date: Borderline diabetes No date: CAD (coronary artery disease)     Comment:  a.) PCI and stent placement (unknown type) to mLAD and               mLCx; date unknown. b.) LHC 09/10/2010: 40% mLAD; no               intervention. c.) LHC 09/12/2011: EF 60%; 30% ISR mLAD,               40% dLAD, 30% pLCx, 30% ISR mLCx, 40% mRCA; no               intervention. d.) LHC 05/23/2014: EF >55%; 30% p-mLAD; no              intervention. e.) LHC 06/11/2016: EF 55-65%; LVEDP norm;               30-40% p-mLAD; no intervention. No date: Carcinoma ([place]) No date: Chronic pain syndrome No date: Depression No date: Fundic gland polyps of stomach, benign No date: Gastritis No date: GERD (gastroesophageal reflux disease) No date: Hepatic steatosis No date:  Hyperlipidemia No date: Hypertension No date: Migraines 2008: Myocardial infarction [facility])     Comment:  was told by PCP No date: Nonischemic cardiomyopathy ([place])     Comment:  a.) TTE 09/12/2011: EF 35-45%. b.) LHC 09/12/2011: EF               60%. c.) TTE 06/04/2012: EF 55-60%. d.) LHC 05/23/2014:               EF >55%. e.) TTE 06/11/2016: EF 55-60%; G1DD. f.) LHC               06/11/2016: EF 55-65%. g.) TTE 01/08/2018: EF 60-65%. No date: Obesity No date: Occlusion and stenosis of bilateral carotid arteries No date: OSA on CPAP No date: Osteoarthritis of both knees No date: Shortness of breath 02/27/2020: Squamous cell carcinoma of skin     Comment:  left distal lat deltoid - EDC Past Surgical History: 11/21/2020: BONE EXCISION; Right     Comment:  Procedure: PART EXCISION BONE- PHALANX;  Surgeon:               Samara Deist, DPM;  Location: [place];  Service: Podiatry;  Laterality: Right; 09/2011: CARDIAC CATHETERIZATION; N/A     Comment:  [facility]; EF 50% with 30% mid LAD stenosis and no               obstructive disease. 09/2010: CARDIAC CATHETERIZATION     Comment:  [place]; Mid LAD 40% stenosis; Mid Circumflex:Normal; Mid               RCA; Normal No date: CARDIAC CATHETERIZATION No date: COLONOSCOPY 04/10/2021: COLONOSCOPY WITH PROPOFOL; N/A     Comment:  Procedure: COLONOSCOPY WITH PROPOFOL;  Surgeon: Jonathon Bellows, MD;  Location[facility] ENDOSCOPY;  Service:               Gastroenterology;  Laterality: N/A; 04/10/2021: ESOPHAGOGASTRODUODENOSCOPY; N/A     Comment:  Procedure: ESOPHAGOGASTRODUODENOSCOPY (EGD);  Surgeon:               Jonathon Bellows, MD;  Location: [facility] ENDOSCOPY;  Service:               Gastroenterology;  Laterality: N/A; 06/10/2017: ESOPHAGOGASTRODUODENOSCOPY (EGD) WITH PROPOFOL; N/A     Comment:  Procedure: ESOPHAGOGASTRODUODENOSCOPY (EGD) WITH               PROPOFOL;  Surgeon: Jonathon Bellows, MD;  Location: [facility]                ENDOSCOPY;  Service: Gastroenterology;  Laterality: N/A; 11/21/2020: HAMMER TOE SURGERY; Right     Comment:  Procedure: HAMMERTOE CORRECTION;  Surgeon: Samara Deist, DPM;  Location: [place];  Service:               Podiatry;  Laterality: Right; 04/16/2021: INSERTION OF MESH     Comment:  Procedure: INSERTION OF MESH;  Surgeon: Olean Ree,               MD;  Location: [facility] ORS;  Service: General;; 09/16/2012: KNEE ARTHROSCOPY WITH MEDIAL MENISECTOMY; Right     Comment:  Procedure: RIGHT KNEE ARTHROSCOPY WITH MEDIAL AND               LATERAL MENISECTOMY, CHONDROPLASTY;  Surgeon: Ninetta Lights, MD;  Location: [place];                Service: Orthopedics;  Laterality: Right;  RIGHT KNEE               SCOPE MEDIAL MENISCECTOMY 06/11/2016: LEFT HEART CATH AND CORONARY ANGIOGRAPHY; N/A     Comment:  Procedure: Left Heart Cath and Coronary Angiography;                Surgeon: Minna Merritts, MD;  Location: [place]               CV LAB;  Service: Cardiovascular;  Laterality: N/A; 8/75/6433: UMBILICAL HERNIA REPAIR; N/A     Comment:  Procedure: HERNIA REPAIR UMBILICAL ADULT, open;                Surgeon: Olean Ree, MD;  Location: [facility] ORS;                Service: General;  Laterality: N/A; BMI    Body Mass Index: 39.16 kg/m     Reproductive/Obstetrics negative OB ROS  Anesthesia Physical Anesthesia Plan  ASA: 3  Anesthesia Plan: General ETT   Post-op Pain Management:    Induction:   PONV Risk Score and Plan:   Airway Management Planned:   Additional Equipment:   Intra-op Plan:   Post-operative Plan:   Informed Consent: I have reviewed the patients History and Physical, chart, labs and discussed the procedure including the risks, benefits and alternatives for the proposed anesthesia with the patient or authorized representative who has indicated his/her  understanding and acceptance.     Dental Advisory Given  Plan Discussed with: CRNA  Anesthesia Plan Comments:        Anesthesia Quick Evaluation

## 2022-01-02 ENCOUNTER — Encounter: Payer: Self-pay | Admitting: Gastroenterology

## 2022-01-02 ENCOUNTER — Ambulatory Visit: Payer: 59 | Admitting: Urology

## 2022-01-03 ENCOUNTER — Telehealth: Payer: Self-pay

## 2022-01-03 NOTE — Telephone Encounter (Signed)
Called patient on 01/01/2022 to reschedule his colonoscopy and he agreed on getting it on 01/06/2022. I also reminded patient to hold his Mounjaro injection this coming Sunday since that's when he stated that he gives it to himself. Patient was also advised to hold his multivitamins 5 days prior. Patient agreed and had no further questions.

## 2022-01-06 ENCOUNTER — Ambulatory Visit
Admission: RE | Admit: 2022-01-06 | Discharge: 2022-01-06 | Disposition: A | Discharge status: Home / Self Care | Payer: 59 | Attending: Gastroenterology | Admitting: Gastroenterology

## 2022-01-06 ENCOUNTER — Encounter
Admission: RE | Disposition: A | Discharge status: Home / Self Care | Payer: Self-pay | Source: Home / Self Care | Attending: Gastroenterology

## 2022-01-06 ENCOUNTER — Ambulatory Visit: Payer: 59 | Admitting: Anesthesiology

## 2022-01-06 DIAGNOSIS — Z85828 Personal history of other malignant neoplasm of skin: Secondary | ICD-10-CM | POA: Insufficient documentation

## 2022-01-06 DIAGNOSIS — E785 Hyperlipidemia, unspecified: Secondary | ICD-10-CM | POA: Insufficient documentation

## 2022-01-06 DIAGNOSIS — I509 Heart failure, unspecified: Secondary | ICD-10-CM | POA: Diagnosis not present

## 2022-01-06 DIAGNOSIS — K3189 Other diseases of stomach and duodenum: Secondary | ICD-10-CM | POA: Insufficient documentation

## 2022-01-06 DIAGNOSIS — G4733 Obstructive sleep apnea (adult) (pediatric): Secondary | ICD-10-CM | POA: Diagnosis not present

## 2022-01-06 DIAGNOSIS — E669 Obesity, unspecified: Secondary | ICD-10-CM | POA: Diagnosis not present

## 2022-01-06 DIAGNOSIS — K760 Fatty (change of) liver, not elsewhere classified: Secondary | ICD-10-CM | POA: Diagnosis not present

## 2022-01-06 DIAGNOSIS — F419 Anxiety disorder, unspecified: Secondary | ICD-10-CM | POA: Diagnosis not present

## 2022-01-06 DIAGNOSIS — K2970 Gastritis, unspecified, without bleeding: Secondary | ICD-10-CM | POA: Insufficient documentation

## 2022-01-06 DIAGNOSIS — I2510 Atherosclerotic heart disease of native coronary artery without angina pectoris: Secondary | ICD-10-CM | POA: Insufficient documentation

## 2022-01-06 DIAGNOSIS — K219 Gastro-esophageal reflux disease without esophagitis: Secondary | ICD-10-CM | POA: Diagnosis not present

## 2022-01-06 DIAGNOSIS — G894 Chronic pain syndrome: Secondary | ICD-10-CM | POA: Insufficient documentation

## 2022-01-06 DIAGNOSIS — F32A Depression, unspecified: Secondary | ICD-10-CM | POA: Diagnosis not present

## 2022-01-06 DIAGNOSIS — K921 Melena: Secondary | ICD-10-CM

## 2022-01-06 DIAGNOSIS — Z6839 Body mass index (BMI) 39.0-39.9, adult: Secondary | ICD-10-CM | POA: Insufficient documentation

## 2022-01-06 DIAGNOSIS — I252 Old myocardial infarction: Secondary | ICD-10-CM | POA: Diagnosis not present

## 2022-01-06 DIAGNOSIS — I110 Hypertensive heart disease with heart failure: Secondary | ICD-10-CM | POA: Diagnosis not present

## 2022-01-06 HISTORY — PX: ESOPHAGOGASTRODUODENOSCOPY (EGD) WITH PROPOFOL: SHX5813

## 2022-01-06 SURGERY — ESOPHAGOGASTRODUODENOSCOPY (EGD) WITH PROPOFOL
Anesthesia: General

## 2022-01-06 MED ORDER — PROPOFOL 10 MG/ML IV BOLUS
INTRAVENOUS | Status: DC | PRN
  Administered 2022-01-06: 40 mg via INTRAVENOUS

## 2022-01-06 MED ORDER — PROPOFOL 500 MG/50ML IV EMUL
INTRAVENOUS | Status: DC | PRN
  Administered 2022-01-06: 150 ug/kg/min via INTRAVENOUS

## 2022-01-06 MED ORDER — SODIUM CHLORIDE 0.9 % IV SOLN: INTRAVENOUS | Status: DC

## 2022-01-06 MED ORDER — LIDOCAINE HCL (CARDIAC) PF 100 MG/5ML IV SOSY
PREFILLED_SYRINGE | INTRAVENOUS | Status: DC | PRN
  Administered 2022-01-06: 80 mg via INTRAVENOUS

## 2022-01-06 NOTE — Transfer of Care (Signed)
Immediate Anesthesia Transfer of Care Note  Patient: Gerald [name]  Procedure(s) Performed: ESOPHAGOGASTRODUODENOSCOPY (EGD) WITH PROPOFOL  Patient Location: PACU  Anesthesia Type:General  Level of Consciousness: drowsy  Airway & Oxygen Therapy: Patient Spontanous Breathing and Patient connected to face mask oxygen  Post-op Assessment: Report given to RN and Post -op Vital signs reviewed and stable  Post vital signs: Reviewed and stable  Last Vitals:  Vitals Value Taken Time  BP 138/74 01/06/22 0928  Temp 36.1 C 01/06/22 0928  Pulse 77 01/06/22 0929  Resp 17 01/06/22 0929  SpO2 96 % 01/06/22 0929  Vitals shown include unvalidated device data.  Last Pain:  Vitals:   01/06/22 0928  TempSrc: Temporal         Complications: No notable events documented.

## 2022-01-06 NOTE — H&P (Signed)
Jonathon Bellows, MD [address] [place], [place], [place], [address] [address], [place], [place], [place], [address] Phone: [phone]  Fax: [phone]  Primary Care Physician:  Mylinda Latina, PA-C   Pre-Procedure History & Physical: HPI:  Gerald [name] is a 56 y.o. male is here for an endoscopy    Past Medical History:  Diagnosis Date   Abnormal LFTs    Anxiety    Atypical chest pain    Borderline diabetes    CAD (coronary artery disease)    a.) PCI and stent placement (unknown type) to mLAD and mLCx; date unknown. b.) LHC 09/10/2010: 40% mLAD; no intervention. c.) LHC 09/12/2011: EF 60%; 30% ISR mLAD, 40% dLAD, 30% pLCx, 30% ISR mLCx, 40% mRCA; no intervention. d.) LHC 05/23/2014: EF >55%; 30% p-mLAD; no intervention. e.) LHC 06/11/2016: EF 55-65%; LVEDP norm; 30-40% p-mLAD; no intervention.   Carcinoma ([place])    Chronic pain syndrome    Depression    Fundic gland polyps of stomach, benign    Gastritis    GERD (gastroesophageal reflux disease)    Hepatic steatosis    Hyperlipidemia    Hypertension    Migraines    Myocardial infarction [facility]) 2008   was told by PCP   Nonischemic cardiomyopathy ([place])    a.) TTE 09/12/2011: EF 35-45%. b.) LHC 09/12/2011: EF 60%. c.) TTE 06/04/2012: EF 55-60%. d.) LHC 05/23/2014: EF >55%. e.) TTE 06/11/2016: EF 55-60%; G1DD. f.) LHC 06/11/2016: EF 55-65%. g.) TTE 01/08/2018: EF 60-65%.   Obesity    Occlusion and stenosis of bilateral carotid arteries    OSA on CPAP    Osteoarthritis of both knees    Shortness of breath    Squamous cell carcinoma of skin 02/27/2020   left distal lat deltoid - [facility]    Past Surgical History:  Procedure Laterality Date   BONE EXCISION Right 11/21/2020   Procedure: PART EXCISION BONE- PHALANX;  Surgeon: Samara Deist, DPM;  Location: [place];  Service: Podiatry;  Laterality: Right;   CARDIAC CATHETERIZATION N/A 09/2011   [facility]; EF 50% with 30% mid LAD stenosis and no  obstructive disease.   CARDIAC CATHETERIZATION  09/2010   [facility]; Mid LAD 40% stenosis; Mid Circumflex:Normal; Mid RCA; Normal   CARDIAC CATHETERIZATION     COLONOSCOPY     COLONOSCOPY WITH PROPOFOL N/A 04/10/2021   Procedure: COLONOSCOPY WITH PROPOFOL;  Surgeon: Jonathon Bellows, MD;  Location: [facility] ENDOSCOPY;  Service: Gastroenterology;  Laterality: N/A;   ESOPHAGOGASTRODUODENOSCOPY N/A 04/10/2021   Procedure: ESOPHAGOGASTRODUODENOSCOPY (EGD);  Surgeon: Jonathon Bellows, MD;  Location: [facility] ENDOSCOPY;  Service: Gastroenterology;  Laterality: N/A;   ESOPHAGOGASTRODUODENOSCOPY (EGD) WITH PROPOFOL N/A 06/10/2017   Procedure: ESOPHAGOGASTRODUODENOSCOPY (EGD) WITH PROPOFOL;  Surgeon: Jonathon Bellows, MD;  Location: [facility] ENDOSCOPY;  Service: Gastroenterology;  Laterality: N/A;   ESOPHAGOGASTRODUODENOSCOPY (EGD) WITH PROPOFOL N/A 01/01/2022   Procedure: ESOPHAGOGASTRODUODENOSCOPY (EGD) WITH PROPOFOL;  Surgeon: Jonathon Bellows, MD;  Location: [facility] ENDOSCOPY;  Service: Gastroenterology;  Laterality: N/A;   HAMMER TOE SURGERY Right 11/21/2020   Procedure: HAMMERTOE CORRECTION;  Surgeon: Samara Deist, DPM;  Location: [place];  Service: Podiatry;  Laterality: Right;   INSERTION OF MESH  04/16/2021   Procedure: INSERTION OF MESH;  Surgeon: Olean Ree, MD;  Location: [facility] ORS;  Service: General;;   KNEE ARTHROSCOPY WITH MEDIAL MENISECTOMY Right 09/16/2012   Procedure: RIGHT KNEE ARTHROSCOPY WITH MEDIAL AND LATERAL MENISECTOMY, CHONDROPLASTY;  Surgeon: Ninetta Lights, MD;  Location: [place];  Service: Orthopedics;  Laterality: Right;  RIGHT KNEE SCOPE MEDIAL MENISCECTOMY   LEFT HEART CATH AND CORONARY ANGIOGRAPHY N/A 06/11/2016   Procedure: Left Heart Cath and Coronary Angiography;  Surgeon: Minna Merritts, MD;  Location: [place] CV LAB;  Service: Cardiovascular;  Laterality: N/A;   UMBILICAL HERNIA REPAIR N/A 04/16/2021   Procedure: HERNIA REPAIR UMBILICAL ADULT, open;  Surgeon: Olean Ree,  MD;  Location: [facility] ORS;  Service: General;  Laterality: N/A;    Prior to Admission medications   Medication Sig Start Date End Date Taking? Authorizing Provider  furosemide (LASIX) 20 MG tablet TAKE 1 TABLET BY MOUTH EVERY DAY AS NEEDED FOR LEG SWELLING 12/16/21  Yes Lavera Guise, MD  gabapentin (NEURONTIN) 300 MG capsule Take 1 capsule (300 mg total) by mouth 3 (three) times daily. 10/28/21  Yes McDonough, Lauren K, PA-C  lisinopril-hydrochlorothiazide (ZESTORETIC) 20-25 MG tablet TAKE 1 TABLET BY MOUTH EVERY DAY 10/08/21  Yes McDonough, Lauren K, PA-C  omeprazole (PRILOSEC) 40 MG capsule Take 1 capsule (40 mg total) by mouth daily. 12/09/21  Yes Jonathon Bellows, MD  acetaminophen (TYLENOL) 500 MG tablet Take 2 tablets (1,000 mg total) by mouth every 6 (six) hours as needed for mild pain. 04/16/21   Piscoya, Jacqulyn Bath, MD  amLODipine (NORVASC) 5 MG tablet TAKE 1 TABLET (5 MG TOTAL) BY MOUTH DAILY. Patient not taking: Reported on 01/06/2022 12/16/21   Lavera Guise, MD  buPROPion (WELLBUTRIN XL) 150 MG 24 hr tablet Take 150 mg by mouth daily.    [provider]  busPIRone (BUSPAR) 5 MG tablet TAKE 1 TABLET BY MOUTH TWICE A DAY 12/16/21   Lavera Guise, MD  citalopram (CELEXA) 40 MG tablet TAKE 1 TABLET BY MOUTH EVERY DAY 12/16/21   Lavera Guise, MD  clomiPHENE (CLOMID) 50 MG tablet Take 1 tablet (50 mg total) by mouth daily. Patient not taking: Reported on 01/06/2022 11/11/21   [name], MD  Multiple Vitamins-Minerals (MULTIVITAMIN ADULTS PO) Take 1 tablet by mouth daily.    [provider]  NEEDLE, DISP, 18 G (BD DISP NEEDLES) 18G X 1-1/2" MISC 1 mg by Does not apply route every 14 (fourteen) days. 11/21/21   McGowan, Larene Beach A, PA-C  NEEDLE, DISP, 21 G (BD DISP NEEDLES) 21G X 1-1/2" MISC 1 mg by Does not apply route every 14 (fourteen) days. 11/21/21   McGowan, Larene Beach A, PA-C  nitroGLYCERIN (NITROSTAT) 0.4 MG SL tablet Place 1 tablet (0.4 mg total) under the tongue every 5 (five)  minutes x 3 doses as needed for chest pain. 04/25/21   Sharen Hones, MD  sildenafil (VIAGRA) 50 MG tablet Take 1-2 tablets (50-100 mg total) by mouth daily as needed for erectile dysfunction (take at least 30 minutes prior to sexual activity on an empty stomach). 10/03/21   [name], MD  Syringe, Disposable, (2-3CC SYRINGE) 3 ML MISC 1 mg by Does not apply route every 14 (fourteen) days. 11/21/21   Zara Council A, PA-C  testosterone cypionate (DEPOTESTOSTERONE CYPIONATE) 200 MG/ML injection Inject 1 mL (200 mg total) into the muscle every 14 (fourteen) days. 11/20/21   McGowan, Larene Beach A, PA-C  tirzepatide [facility]) 10 MG/0.5ML Pen Inject 10 mg into the skin once a week. 12/30/21   McDonough, Si Gaul, PA-C    Allergies as of 01/01/2022 - Review Complete 01/01/2022  Allergen Reaction Noted   Statins Rash and Hives 04/15/2012    Family History  Problem Relation Age of Onset   Heart  disease Father 93       MI   Heart attack Father    Stomach cancer Father    Hypertension Mother    Breast cancer Mother     Social History   Socioeconomic History   Marital status: Married    Spouse name: Not on file   Number of children: 3   Years of education: Not on file   Highest education level: Not on file  Occupational History   Occupation: Librarian, academic  Tobacco Use   Smoking status: Never    Passive exposure: Past   Smokeless tobacco: Former    Types: Chew    Quit date: 1987  Vaping Use   Vaping Use: Never used  Substance and Sexual Activity   Alcohol use: Not Currently   Drug use: No   Sexual activity: Yes  Other Topics Concern   Not on file  Social History Narrative   Married.  33 yo son.   Wife on disability for bipolar disorder.     Social Determinants of Health   Financial Resource Strain: Not on file  Food Insecurity: Not on file  Transportation Needs: Not on file  Physical Activity: Not on file  Stress: Not on file  Social Connections: Not on file   Intimate Partner Violence: Not on file    Review of Systems: See HPI, otherwise negative ROS  Physical Exam: BP (!) 135/92   Pulse 69   Temp 97.7 F (36.5 C) (Temporal)   Resp 18   Ht '6\' 2"'$  (1.88 m)   Wt (!) 138.3 kg   SpO2 96%   BMI 39.16 kg/m  General:   Alert,  pleasant and cooperative in NAD Head:  Normocephalic and atraumatic. Neck:  Supple; no masses or thyromegaly. Lungs:  Clear throughout to auscultation, normal respiratory effort.    Heart:  +S1, +S2, Regular rate and rhythm, No edema. Abdomen:  Soft, nontender and nondistended. Normal bowel sounds, without guarding, and without rebound.   Neurologic:  Alert and  oriented x4;  grossly normal neurologically.  Impression/Plan: Gerald [name] is here for an endoscopy  to be performed for  evaluation of melena    Risks, benefits, limitations, and alternatives regarding endoscopy have been reviewed with the patient.  Questions have been answered.  All parties agreeable.   Jonathon Bellows, MD  01/06/2022, 9:15 AM

## 2022-01-06 NOTE — Op Note (Signed)
[facility] Gastroenterology Patient Name: Gerald [name] Procedure Date: 01/06/2022 9:12 AM MRN: [id number] Account #: [id number] Date of Birth: [birth year] Admit Type: Outpatient Age: 56 Room: [facility] ENDO ROOM 1 Gender: Male Note Status: Finalized Instrument Name: Upper Endoscope [id number] Procedure:             Upper GI endoscopy Indications:           Melena Providers:             Jonathon Bellows MD, MD Referring MD:          Mylinda Latina (Referring MD) Medicines:             Monitored Anesthesia Care Complications:         No immediate complications. Procedure:             Pre-Anesthesia Assessment:                        - Prior to the procedure, a History and Physical was                         performed, and patient medications, allergies and                         sensitivities were reviewed. The patient's tolerance                         of previous anesthesia was reviewed.                        - The risks and benefits of the procedure and the                         sedation options and risks were discussed with the                         patient. All questions were answered and informed                         consent was obtained.                        - ASA Grade Assessment: II - A patient with mild                         systemic disease.                        After obtaining informed consent, the endoscope was                         passed under direct vision. Throughout the procedure,                         the patient's blood pressure, pulse, and oxygen                         saturations were monitored continuously. The Endoscope                         was introduced through  the mouth, and advanced to the                         third part of duodenum. The upper GI endoscopy was                         accomplished with ease. The patient tolerated the                         procedure well. Findings:      The examined esophagus was  normal.      The examined duodenum was normal.      Localized mildly erythematous mucosa without bleeding was found in the       prepyloric region of the stomach. Biopsies were taken with a cold       forceps for histology.      The cardia and gastric fundus were normal on retroflexion. Impression:            - Normal esophagus.                        - Normal examined duodenum.                        - Erythematous mucosa in the prepyloric region of the                         stomach. Biopsied. Recommendation:        - Await pathology results.                        - Discharge patient to home (with escort).                        - Resume previous diet.                        - Continue present medications.                        - Return to my office in 3 weeks.                        - If still has melena will need capsule study opf the                         smalll bowel Procedure Code(s):     --- Professional ---                        [phone], Esophagogastroduodenoscopy, flexible,                         transoral; with biopsy, single or multiple Diagnosis Code(s):     --- Professional ---                        K31.89, Other diseases of stomach and duodenum                        K92.1, Melena (includes Hematochezia) CPT copyright 2022 American Medical Association. All rights reserved.  The codes documented in this report are preliminary and upon coder review may  be revised to meet current compliance requirements. Jonathon Bellows, MD Jonathon Bellows MD, MD 01/06/2022 9:25:54 AM This report has been signed electronically. Number of Addenda: 0 Note Initiated On: 01/06/2022 9:12 AM Estimated Blood Loss:  Estimated blood loss: none.      [facility]

## 2022-01-06 NOTE — Anesthesia Postprocedure Evaluation (Signed)
Anesthesia Post Note  Patient: Gerald [name]  Procedure(s) Performed: ESOPHAGOGASTRODUODENOSCOPY (EGD) WITH PROPOFOL  Patient location during evaluation: Endoscopy Anesthesia Type: General Level of consciousness: awake and alert Pain management: pain level controlled Vital Signs Assessment: post-procedure vital signs reviewed and stable Respiratory status: spontaneous breathing, nonlabored ventilation, respiratory function stable and patient connected to nasal cannula oxygen Cardiovascular status: blood pressure returned to baseline and stable Postop Assessment: no apparent nausea or vomiting Anesthetic complications: no   No notable events documented.   Last Vitals:  Vitals:   01/06/22 0833 01/06/22 0928  BP: (!) 135/92 138/74  Pulse: 69   Resp: 18   Temp: 36.5 C (!) 36.1 C  SpO2: 96%     Last Pain:  Vitals:   01/06/22 0928  TempSrc: Temporal                 [name]

## 2022-01-06 NOTE — Anesthesia Preprocedure Evaluation (Signed)
Anesthesia Evaluation  Patient identified by MRN, date of birth, ID band Patient awake    Reviewed: Allergy & Precautions, NPO status , Patient's Chart, lab work & pertinent test results  Airway Mallampati: III  TM Distance: >3 FB Neck ROM: full    Dental  (+) Teeth Intact   Pulmonary sleep apnea and Continuous Positive Airway Pressure Ventilation    Pulmonary exam normal        Cardiovascular hypertension, + CAD, + Past MI and +CHF  Normal cardiovascular exam  IMPRESSIONS     1. Left ventricular ejection fraction, by estimation, is 55 %. The left  ventricle has normal function. The left ventricle has no regional wall  motion abnormalities. Left ventricular diastolic parameters are consistent  with Grade II diastolic dysfunction   (pseudonormalization).   2. Right ventricular systolic function is normal. The right ventricular  size is normal.   3. The mitral valve is normal in structure. No evidence of mitral valve  regurgitation. No evidence of mitral stenosis.   4. The aortic valve was not well visualized. Aortic valve regurgitation  is not visualized. No aortic stenosis is present.   5. The inferior vena cava is normal in size with greater than 50%  respiratory variability, suggesting right atrial pressure of 3 mmHg.     Neuro/Psych  PSYCHIATRIC DISORDERS Anxiety Depression    negative neurological ROS     GI/Hepatic Neg liver ROS,GERD  Medicated,,  Endo/Other    Morbid obesity  Renal/GU negative Renal ROS  negative genitourinary   Musculoskeletal   Abdominal   Peds  Hematology negative hematology ROS (+)   Anesthesia Other Findings Past Medical History: No date: Abnormal LFTs No date: Anxiety No date: Atypical chest pain No date: Borderline diabetes No date: CAD (coronary artery disease)     Comment:  a.) PCI and stent placement (unknown type) to mLAD and               mLCx; date unknown. b.) LHC  09/10/2010: 40% mLAD; no               intervention. c.) LHC 09/12/2011: EF 60%; 30% ISR mLAD,               40% dLAD, 30% pLCx, 30% ISR mLCx, 40% mRCA; no               intervention. d.) LHC 05/23/2014: EF >55%; 30% p-mLAD; no              intervention. e.) LHC 06/11/2016: EF 55-65%; LVEDP norm;               30-40% p-mLAD; no intervention. No date: Carcinoma ([place]) No date: Chronic pain syndrome No date: Depression No date: Fundic gland polyps of stomach, benign No date: Gastritis No date: GERD (gastroesophageal reflux disease) No date: Hepatic steatosis No date: Hyperlipidemia No date: Hypertension No date: Migraines 2008: Myocardial infarction [facility])     Comment:  was told by PCP No date: Nonischemic cardiomyopathy ([place])     Comment:  a.) TTE 09/12/2011: EF 35-45%. b.) LHC 09/12/2011: EF               60%. c.) TTE 06/04/2012: EF 55-60%. d.) LHC 05/23/2014:               EF >55%. e.) TTE 06/11/2016: EF 55-60%; G1DD. f.) LHC               06/11/2016: EF 55-65%. g.) TTE 01/08/2018:  EF 60-65%. No date: Obesity No date: Occlusion and stenosis of bilateral carotid arteries No date: OSA on CPAP No date: Osteoarthritis of both knees No date: Shortness of breath 02/27/2020: Squamous cell carcinoma of skin     Comment:  left distal lat deltoid - [facility]  Past Surgical History: 11/21/2020: BONE EXCISION; Right     Comment:  Procedure: PART EXCISION BONE- PHALANX;  Surgeon:               Samara Deist, DPM;  Location: [place];                Service: Podiatry;  Laterality: Right; 09/2011: CARDIAC CATHETERIZATION; N/A     Comment:  [facility]; EF 50% with 30% mid LAD stenosis and no               obstructive disease. 09/2010: CARDIAC CATHETERIZATION     Comment:  [place]; Mid LAD 40% stenosis; Mid Circumflex:Normal; Mid               RCA; Normal No date: CARDIAC CATHETERIZATION No date: COLONOSCOPY 04/10/2021: COLONOSCOPY WITH PROPOFOL; N/A     Comment:  Procedure: COLONOSCOPY WITH  PROPOFOL;  Surgeon: Jonathon Bellows, MD;  Location: [facility] ENDOSCOPY;  Service:               Gastroenterology;  Laterality: N/A; 04/10/2021: ESOPHAGOGASTRODUODENOSCOPY; N/A     Comment:  Procedure: ESOPHAGOGASTRODUODENOSCOPY (EGD);  Surgeon:               Jonathon Bellows, MD;  Location: [facility] ENDOSCOPY;  Service:               Gastroenterology;  Laterality: N/A; 06/10/2017: ESOPHAGOGASTRODUODENOSCOPY (EGD) WITH PROPOFOL; N/A     Comment:  Procedure: ESOPHAGOGASTRODUODENOSCOPY (EGD) WITH               PROPOFOL;  Surgeon: Jonathon Bellows, MD;  Location: [facility]               ENDOSCOPY;  Service: Gastroenterology;  Laterality: N/A; 01/01/2022: ESOPHAGOGASTRODUODENOSCOPY (EGD) WITH PROPOFOL; N/A     Comment:  Procedure: ESOPHAGOGASTRODUODENOSCOPY (EGD) WITH               PROPOFOL;  Surgeon: Jonathon Bellows, MD;  Location: [facility]               ENDOSCOPY;  Service: Gastroenterology;  Laterality: N/A; 11/21/2020: HAMMER TOE SURGERY; Right     Comment:  Procedure: HAMMERTOE CORRECTION;  Surgeon: Samara Deist, DPM;  Location: [place];  Service:               Podiatry;  Laterality: Right; 04/16/2021: INSERTION OF MESH     Comment:  Procedure: INSERTION OF MESH;  Surgeon: Olean Ree,               MD;  Location: [facility] ORS;  Service: General;; 09/16/2012: KNEE ARTHROSCOPY WITH MEDIAL MENISECTOMY; Right     Comment:  Procedure: RIGHT KNEE ARTHROSCOPY WITH MEDIAL AND               LATERAL MENISECTOMY, CHONDROPLASTY;  Surgeon: Ninetta Lights, MD;  Location: [place];                Service: Orthopedics;  Laterality: Right;  RIGHT KNEE               SCOPE MEDIAL MENISCECTOMY 06/11/2016: LEFT HEART CATH AND CORONARY ANGIOGRAPHY; N/A     Comment:  Procedure: Left Heart Cath and Coronary Angiography;                Surgeon: Minna Merritts, MD;  Location: [place]               CV LAB;  Service: Cardiovascular;  Laterality: N/A; 0/35/4656: UMBILICAL  HERNIA REPAIR; N/A     Comment:  Procedure: HERNIA REPAIR UMBILICAL ADULT, open;                Surgeon: Olean Ree, MD;  Location: [facility] ORS;                Service: General;  Laterality: N/A;     Reproductive/Obstetrics negative OB ROS                              Anesthesia Physical Anesthesia Plan  ASA: 3  Anesthesia Plan: General   Post-op Pain Management:    Induction: Intravenous  PONV Risk Score and Plan: Propofol infusion and TIVA  Airway Management Planned: Natural Airway and Nasal Cannula  Additional Equipment:   Intra-op Plan:   Post-operative Plan:   Informed Consent: I have reviewed the patients History and Physical, chart, labs and discussed the procedure including the risks, benefits and alternatives for the proposed anesthesia with the patient or authorized representative who has indicated his/her understanding and acceptance.     Dental Advisory Given  Plan Discussed with: Anesthesiologist, CRNA and Surgeon  Anesthesia Plan Comments: (Patient consented for risks of anesthesia including but not limited to:  - adverse reactions to medications - risk of airway placement if required - damage to eyes, teeth, lips or other oral mucosa - nerve damage due to positioning  - sore throat or hoarseness - Damage to heart, brain, nerves, lungs, other parts of body or loss of life  Patient voiced understanding.)         Anesthesia Quick Evaluation

## 2022-01-07 ENCOUNTER — Encounter: Payer: Self-pay | Admitting: Gastroenterology

## 2022-01-07 LAB — SURGICAL PATHOLOGY

## 2022-01-08 ENCOUNTER — Other Ambulatory Visit: Payer: 59

## 2022-01-08 ENCOUNTER — Other Ambulatory Visit: Payer: Self-pay

## 2022-01-08 DIAGNOSIS — E349 Endocrine disorder, unspecified: Secondary | ICD-10-CM

## 2022-01-09 ENCOUNTER — Other Ambulatory Visit: Payer: 59

## 2022-01-09 LAB — HEMOGLOBIN AND HEMATOCRIT, BLOOD
Hematocrit: 48.2 % (ref 37.5–51.0)
Hemoglobin: 16.2 g/dL (ref 13.0–17.7)

## 2022-01-09 LAB — TESTOSTERONE: Testosterone: 530 ng/dL (ref 264–916)

## 2022-01-13 ENCOUNTER — Telehealth: Payer: Self-pay

## 2022-01-13 ENCOUNTER — Encounter: Payer: Self-pay | Admitting: Gastroenterology

## 2022-01-13 NOTE — Telephone Encounter (Signed)
Notified pt as advised. Made pt's f/u lab appt and OV. Pt would like to know if you would consider putting him on the testosterone gel as he is having trouble with the injections.

## 2022-01-13 NOTE — Telephone Encounter (Signed)
-----   Message from Nori Riis, PA-C sent at 01/12/2022  7:56 PM EST ----- Please let Gerald [name] know that his testosterone level and hemoglobin and hematocrit were normal.  We need to see him again in three months for a repeat testosterone (one week after an injection), free and total PSA, hemoglobin and hematocrit, I PSS, SHIM and exam.

## 2022-01-14 ENCOUNTER — Other Ambulatory Visit: Payer: Self-pay | Admitting: Urology

## 2022-01-14 DIAGNOSIS — E349 Endocrine disorder, unspecified: Secondary | ICD-10-CM

## 2022-01-14 MED ORDER — TESTOSTERONE 20.25 MG/ACT (1.62%) TD GEL: 40.5000 mg | Freq: Every day | TRANSDERMAL | 3 refills | Status: DC

## 2022-01-14 NOTE — Telephone Encounter (Signed)
[facility] on [address]

## 2022-01-14 NOTE — Telephone Encounter (Signed)
Pt scheduled for 1 mo repeat Testosterone, pt confirmed.

## 2022-01-21 ENCOUNTER — Telehealth: Payer: Self-pay | Admitting: *Deleted

## 2022-01-21 NOTE — Telephone Encounter (Signed)
Pt calling stating that his testosterone need prior auth and that [facility] has been sending [place] information to get this started. I called pharmacy and got information verbally   [id number] Bin# [id number] Group# [id number]

## 2022-01-22 ENCOUNTER — Telehealth: Payer: Self-pay

## 2022-01-22 NOTE — Telephone Encounter (Signed)
Incoming call from pt on triage line who states that he has been administering his injections of testosterone for some time with no complications. However he injected 3 days ago and is still having significant soreness at the injection site (RT thigh). He states that it is tender to the touch, denies warmth or redness at the site. Denies fever or chills. Denies bruising. Please advise.

## 2022-01-22 NOTE — Telephone Encounter (Signed)
Called pt to offer appt for 12/21 '@4pm'$ , no answer. LM for pt to call back if he would like appt.

## 2022-01-23 ENCOUNTER — Encounter: Payer: Self-pay | Admitting: Urology

## 2022-01-23 ENCOUNTER — Ambulatory Visit (INDEPENDENT_AMBULATORY_CARE_PROVIDER_SITE_OTHER): Payer: 59 | Admitting: Urology

## 2022-01-23 VITALS — BP 123/81 | HR 99 | Ht 74.0 in | Wt 280.0 lb

## 2022-01-23 DIAGNOSIS — E349 Endocrine disorder, unspecified: Secondary | ICD-10-CM

## 2022-01-23 DIAGNOSIS — T8089XA Other complications following infusion, transfusion and therapeutic injection, initial encounter: Secondary | ICD-10-CM

## 2022-01-23 DIAGNOSIS — E291 Testicular hypofunction: Secondary | ICD-10-CM

## 2022-01-23 DIAGNOSIS — R52 Pain, unspecified: Secondary | ICD-10-CM

## 2022-01-23 MED ORDER — HYDROCODONE-ACETAMINOPHEN 5-325 MG PO TABS: 1.0000 | ORAL_TABLET | Freq: Four times a day (QID) | ORAL | 0 refills | Status: DC | PRN

## 2022-01-23 NOTE — Progress Notes (Signed)
01/23/2022 10:05 PM   Katheran Awe [birth year] [id number]  Referring provider: Mylinda Latina, PA-C [place],  [place] [address]  Urological history: 1. ED -contributing factors of age, testosterone deficiency, diabetes, depression, HTN, CAD, sleep apnea, HLD and anxiety -sildenafil 50 mg, on-demand-dosing  2. BPH w/ LU TS -PSA (11/2021) 3.3   Chief Complaint  Patient presents with   Follow-up    HPI: URIAS SHEEK is a 56 y.o. male who presents today after having severe pain in his right leg after an IM injection of testosterone cypionate for the last 4 days.  He states the previous injections have gone okay for him, but his last injection 4 days ago resulted in pain.  He states he did not experience any pain in injecting the medication, pushing the medication or withdrawing the needles.  He also did not noted any blood in the needle or after the injection.  It was not until 2 hours later the injection site started to cause significant pain.  He states the pain has been constant since it occurred.  It is made worse by standing and sitting.    PMH: Past Medical History:  Diagnosis Date   Abnormal LFTs    Anxiety    Atypical chest pain    Borderline diabetes    CAD (coronary artery disease)    a.) PCI and stent placement (unknown type) to mLAD and mLCx; date unknown. b.) LHC 09/10/2010: 40% mLAD; no intervention. c.) LHC 09/12/2011: EF 60%; 30% ISR mLAD, 40% dLAD, 30% pLCx, 30% ISR mLCx, 40% mRCA; no intervention. d.) LHC 05/23/2014: EF >55%; 30% p-mLAD; no intervention. e.) LHC 06/11/2016: EF 55-65%; LVEDP norm; 30-40% p-mLAD; no intervention.   Carcinoma ([place])    Chronic pain syndrome    Depression    Fundic gland polyps of stomach, benign    Gastritis    GERD (gastroesophageal reflux disease)    Hepatic steatosis    Hyperlipidemia    Hypertension    Migraines    Myocardial infarction [facility]) 2008   was told by PCP   Nonischemic  cardiomyopathy ([place])    a.) TTE 09/12/2011: EF 35-45%. b.) LHC 09/12/2011: EF 60%. c.) TTE 06/04/2012: EF 55-60%. d.) LHC 05/23/2014: EF >55%. e.) TTE 06/11/2016: EF 55-60%; G1DD. f.) LHC 06/11/2016: EF 55-65%. g.) TTE 01/08/2018: EF 60-65%.   Obesity    Occlusion and stenosis of bilateral carotid arteries    OSA on CPAP    Osteoarthritis of both knees    Shortness of breath    Squamous cell carcinoma of skin 02/27/2020   left distal lat deltoid - [facility]    Surgical History: Past Surgical History:  Procedure Laterality Date   BONE EXCISION Right 11/21/2020   Procedure: PART EXCISION BONE- PHALANX;  Surgeon: Samara Deist, DPM;  Location: [place];  Service: Podiatry;  Laterality: Right;   CARDIAC CATHETERIZATION N/A 09/2011   [facility]; EF 50% with 30% mid LAD stenosis and no obstructive disease.   CARDIAC CATHETERIZATION  09/2010   [facility]; Mid LAD 40% stenosis; Mid Circumflex:Normal; Mid RCA; Normal   CARDIAC CATHETERIZATION     COLONOSCOPY     COLONOSCOPY WITH PROPOFOL N/A 04/10/2021   Procedure: COLONOSCOPY WITH PROPOFOL;  Surgeon: Jonathon Bellows, MD;  Location: [facility] ENDOSCOPY;  Service: Gastroenterology;  Laterality: N/A;   ESOPHAGOGASTRODUODENOSCOPY N/A 04/10/2021   Procedure: ESOPHAGOGASTRODUODENOSCOPY (EGD);  Surgeon: Jonathon Bellows, MD;  Location: [facility] ENDOSCOPY;  Service: Gastroenterology;  Laterality: N/A;   ESOPHAGOGASTRODUODENOSCOPY (EGD)  WITH PROPOFOL N/A 06/10/2017   Procedure: ESOPHAGOGASTRODUODENOSCOPY (EGD) WITH PROPOFOL;  Surgeon: Jonathon Bellows, MD;  Location: [facility] ENDOSCOPY;  Service: Gastroenterology;  Laterality: N/A;   ESOPHAGOGASTRODUODENOSCOPY (EGD) WITH PROPOFOL N/A 01/01/2022   Procedure: ESOPHAGOGASTRODUODENOSCOPY (EGD) WITH PROPOFOL;  Surgeon: Jonathon Bellows, MD;  Location: [facility] ENDOSCOPY;  Service: Gastroenterology;  Laterality: N/A;   ESOPHAGOGASTRODUODENOSCOPY (EGD) WITH PROPOFOL N/A 01/06/2022   Procedure: ESOPHAGOGASTRODUODENOSCOPY (EGD) WITH PROPOFOL;  Surgeon: Jonathon Bellows, MD;  Location: [facility] ENDOSCOPY;  Service: Gastroenterology;  Laterality: N/A;   HAMMER TOE SURGERY Right 11/21/2020   Procedure: HAMMERTOE CORRECTION;  Surgeon: Samara Deist, DPM;  Location: [place];  Service: Podiatry;  Laterality: Right;   INSERTION OF MESH  04/16/2021   Procedure: INSERTION OF MESH;  Surgeon: Olean Ree, MD;  Location: [facility] ORS;  Service: General;;   KNEE ARTHROSCOPY WITH MEDIAL MENISECTOMY Right 09/16/2012   Procedure: RIGHT KNEE ARTHROSCOPY WITH MEDIAL AND LATERAL MENISECTOMY, CHONDROPLASTY;  Surgeon: Ninetta Lights, MD;  Location: [place];  Service: Orthopedics;  Laterality: Right;  RIGHT KNEE SCOPE MEDIAL MENISCECTOMY   LEFT HEART CATH AND CORONARY ANGIOGRAPHY N/A 06/11/2016   Procedure: Left Heart Cath and Coronary Angiography;  Surgeon: Minna Merritts, MD;  Location: [place] CV LAB;  Service: Cardiovascular;  Laterality: N/A;   UMBILICAL HERNIA REPAIR N/A 04/16/2021   Procedure: HERNIA REPAIR UMBILICAL ADULT, open;  Surgeon: Olean Ree, MD;  Location: [facility] ORS;  Service: General;  Laterality: N/A;    Home Medications:  Allergies as of 01/23/2022       Reactions   Statins Rash, Hives           Medication List        Accurate as of January 23, 2022 10:05 PM. If you have any questions, ask your nurse or doctor.          STOP taking these medications    acetaminophen 500 MG tablet Commonly known as: TYLENOL Stopped by: Kaydan Wilhoite, PA-C   amLODipine 5 MG tablet Commonly known as: NORVASC Stopped by: Zara Council, PA-C   clomiPHENE 50 MG tablet Commonly known as: CLOMID Stopped by: Temple Sporer, PA-C       TAKE these medications    2-3CC SYRINGE 3 ML Misc 1 mg by Does not apply route every 14 (fourteen) days.   BD Disp Needles 18G X 1-1/2" Misc Generic drug: NEEDLE (DISP) 18 G 1 mg by Does not apply route every 14 (fourteen) days.   BD Disp Needles 21G X 1-1/2" Misc Generic  drug: NEEDLE (DISP) 21 G 1 mg by Does not apply route every 14 (fourteen) days.   buPROPion 150 MG 24 hr tablet Commonly known as: WELLBUTRIN XL Take 150 mg by mouth daily.   busPIRone 5 MG tablet Commonly known as: BUSPAR TAKE 1 TABLET BY MOUTH TWICE A DAY   citalopram 40 MG tablet Commonly known as: CELEXA TAKE 1 TABLET BY MOUTH EVERY DAY   furosemide 20 MG tablet Commonly known as: LASIX TAKE 1 TABLET BY MOUTH EVERY DAY AS NEEDED FOR LEG SWELLING   gabapentin 300 MG capsule Commonly known as: NEURONTIN Take 1 capsule (300 mg total) by mouth 3 (three) times daily.   HYDROcodone-acetaminophen 5-325 MG tablet Commonly known as: NORCO/VICODIN Take 1 tablet by mouth every 6 (six) hours as needed for moderate pain. Started by: Zara Council, PA-C   lisinopril-hydrochlorothiazide 20-25 MG tablet Commonly known as: ZESTORETIC TAKE 1 TABLET BY MOUTH EVERY DAY   MULTIVITAMIN ADULTS PO  Take 1 tablet by mouth daily.   nitroGLYCERIN 0.4 MG SL tablet Commonly known as: NITROSTAT Place 1 tablet (0.4 mg total) under the tongue every 5 (five) minutes x 3 doses as needed for chest pain.   omeprazole 40 MG capsule Commonly known as: PRILOSEC Take 1 capsule (40 mg total) by mouth daily.   sildenafil 50 MG tablet Commonly known as: VIAGRA Take 1-2 tablets (50-100 mg total) by mouth daily as needed for erectile dysfunction (take at least 30 minutes prior to sexual activity on an empty stomach).   Testosterone 20.25 MG/ACT (1.62%) Gel Place 40.5 mg onto the skin daily. This would be two pumps daily   tirzepatide 10 MG/0.5ML Pen Commonly known as: MOUNJARO Inject 10 mg into the skin once a week.        Allergies:  Allergies  Allergen Reactions   Statins Rash and Hives         Family History: Family History  Problem Relation Age of Onset   Heart disease Father 43       MI   Heart attack Father    Stomach cancer Father    Hypertension Mother    Breast cancer  Mother     Social History:  reports that he has never smoked. He has been exposed to tobacco smoke. He quit smokeless tobacco use about 36 years ago.  His smokeless tobacco use included chew. He reports that he does not currently use alcohol. He reports that he does not use drugs.  ROS: Pertinent ROS in HPI  Physical Exam: BP 123/81   Pulse 99   Ht '6\' 2"'$  (1.88 m)   Wt 280 lb (127 kg)   BMI 35.95 kg/m   Constitutional:  Well nourished. Alert and oriented, No acute distress. HEENT: [place] AT, moist mucus membranes.  Trachea midline Cardiovascular: No clubbing, cyanosis, or edema. Respiratory: Normal respiratory effort, no increased work of breathing. Neurologic: Grossly intact, no focal deficits, moving all 4 extremities. Psychiatric: Normal mood and affect. The right leg was not erythemic.  No lump or swelling is noted.  No crepitus is noted.  He is tender down the lateral band of his vastus lateralis.    Laboratory Data: Lab Results  Component Value Date   CREATININE 1.00 12/09/2021   Lab Results  Component Value Date   TESTOSTERONE 530 01/08/2022    Lab Results  Component Value Date   HGBA1C 6.1 (A) 12/30/2021   Lab Results  Component Value Date   AST 19 12/09/2021   Lab Results  Component Value Date   ALT 27 12/09/2021  I have reviewed the labs.   Pertinent Imaging: N/A  Assessment & Plan:    1. Pain at injection site -no sign of infection or hematoma at this time -patient may have injected into a ligament/tendon  -he will be off work for the next four days, so he will stay off his feet as much as possible -Vicodin 5/325, # 5, every 6 hours for pain -continue ibuprofen -he will follow up w/ PCP if symptoms do not improve or worsen  2. Testosterone deficiency -patient would like to switch to testosterone gel at this time -we will contact the patient once we obtain prior approval for the gel  -if he gain prior approval for the gel, we will need to check  his testosterone, hemoglobin and hematocrit after he has been on the gel for 30 days  Return for Pending PA results .  These notes generated with voice  recognition software. I apologize for typographical errors.  [place], [place] [address]  [place], [place] [address] [phone]

## 2022-01-28 ENCOUNTER — Other Ambulatory Visit: Payer: Self-pay

## 2022-01-28 DIAGNOSIS — E1165 Type 2 diabetes mellitus with hyperglycemia: Secondary | ICD-10-CM

## 2022-01-28 MED ORDER — TIRZEPATIDE 10 MG/0.5ML ~~LOC~~ SOAJ: 10.0000 mg | SUBCUTANEOUS | 3 refills | Status: DC

## 2022-01-29 NOTE — Telephone Encounter (Signed)
PA sent to plan, waiting on response.

## 2022-01-30 NOTE — Telephone Encounter (Signed)
Patient notified PA has been approved   [id number]. TESTOSTERONE GEL 1.62% is approved through 01/30/2023.

## 2022-02-08 ENCOUNTER — Other Ambulatory Visit: Payer: Self-pay | Admitting: Physician Assistant

## 2022-02-08 DIAGNOSIS — E1165 Type 2 diabetes mellitus with hyperglycemia: Secondary | ICD-10-CM

## 2022-02-14 ENCOUNTER — Other Ambulatory Visit: Payer: Self-pay

## 2022-02-14 DIAGNOSIS — E349 Endocrine disorder, unspecified: Secondary | ICD-10-CM

## 2022-02-17 ENCOUNTER — Other Ambulatory Visit: Payer: 59

## 2022-02-17 DIAGNOSIS — E349 Endocrine disorder, unspecified: Secondary | ICD-10-CM

## 2022-02-18 ENCOUNTER — Encounter: Payer: Self-pay | Admitting: Family Medicine

## 2022-02-18 LAB — TESTOSTERONE: Testosterone: 75 ng/dL — ABNORMAL LOW (ref 264–916)

## 2022-02-19 NOTE — Telephone Encounter (Signed)
Incoming call from pt on triage line who states that he applied his gel the night before his lab appointment. Please advise.

## 2022-03-09 ENCOUNTER — Other Ambulatory Visit: Payer: Self-pay | Admitting: Gastroenterology

## 2022-03-10 ENCOUNTER — Other Ambulatory Visit: Payer: Self-pay | Admitting: Physician Assistant

## 2022-03-10 DIAGNOSIS — I10 Essential (primary) hypertension: Secondary | ICD-10-CM

## 2022-03-11 ENCOUNTER — Other Ambulatory Visit: Payer: Self-pay | Admitting: Physician Assistant

## 2022-03-11 NOTE — Telephone Encounter (Signed)
Please review and send if its ok

## 2022-03-12 ENCOUNTER — Other Ambulatory Visit: Payer: Self-pay

## 2022-03-13 ENCOUNTER — Ambulatory Visit: Payer: 59 | Admitting: Gastroenterology

## 2022-04-03 ENCOUNTER — Encounter: Payer: Self-pay | Admitting: Physician Assistant

## 2022-04-03 ENCOUNTER — Ambulatory Visit: Payer: 59 | Admitting: Physician Assistant

## 2022-04-03 VITALS — BP 148/80 | HR 80 | Temp 98.4°F | Resp 16 | Ht 74.0 in | Wt 325.6 lb

## 2022-04-03 DIAGNOSIS — I10 Essential (primary) hypertension: Secondary | ICD-10-CM | POA: Diagnosis not present

## 2022-04-03 DIAGNOSIS — Z6841 Body Mass Index (BMI) 40.0 and over, adult: Secondary | ICD-10-CM

## 2022-04-03 DIAGNOSIS — G5793 Unspecified mononeuropathy of bilateral lower limbs: Secondary | ICD-10-CM

## 2022-04-03 DIAGNOSIS — E1165 Type 2 diabetes mellitus with hyperglycemia: Secondary | ICD-10-CM

## 2022-04-03 DIAGNOSIS — G4733 Obstructive sleep apnea (adult) (pediatric): Secondary | ICD-10-CM

## 2022-04-03 MED ORDER — TRULICITY 0.75 MG/0.5ML ~~LOC~~ SOAJ: 0.7500 mg | SUBCUTANEOUS | 0 refills | Status: DC

## 2022-04-03 MED ORDER — GABAPENTIN 300 MG PO CAPS: 300.0000 mg | ORAL_CAPSULE | Freq: Three times a day (TID) | ORAL | 1 refills | Status: DC

## 2022-04-03 NOTE — Progress Notes (Signed)
[facility] [place],  [place] [address]  Internal MEDICINE  Office Visit Note  Patient Name: Gerald [name]  [id number]  [id number]  Date of Service: 04/03/2022  Chief Complaint  Patient presents with   Follow-up   Depression   Gastroesophageal Reflux   Hypertension   Hyperlipidemia   Quality Metric Gaps    Shingles Vaccines    HPI Pt is here for routine follow up -Has started on new CPAP last Tuesday and is going very well. Can tell he is sleeping better. Will need to follow up after 30days to track compliance and will request download  -did stop mounjaro due to side effects and has been gaining weight again since. Would like to go back to trulicity as he did better with this. -BP has been up at home as well, 175/105 some days and then will come back down to normal. Will double amlodipine to '10mg'$ --will call for script if needed  -taking '600mg'$  TID due to neuropathy, this increase has helped, but advised not to increase further  Current Medication: Outpatient Encounter Medications as of 04/03/2022  Medication Sig   amLODipine (NORVASC) 5 MG tablet Take 5 mg by mouth daily.   buPROPion (WELLBUTRIN XL) 150 MG 24 hr tablet TAKE 1 TABLET BY MOUTH EVERY DAY   busPIRone (BUSPAR) 5 MG tablet TAKE 1 TABLET BY MOUTH TWICE A DAY   citalopram (CELEXA) 40 MG tablet TAKE 1 TABLET BY MOUTH EVERY DAY   Dulaglutide (TRULICITY) A999333 0000000 SOPN Inject 0.75 mg into the skin once a week.   furosemide (LASIX) 20 MG tablet TAKE 1 TABLET BY MOUTH EVERY DAY AS NEEDED FOR LEG SWELLING   HYDROcodone-acetaminophen (NORCO/VICODIN) 5-325 MG tablet Take 1 tablet by mouth every 6 (six) hours as needed for moderate pain.   lisinopril-hydrochlorothiazide (ZESTORETIC) 20-25 MG tablet TAKE 1 TABLET BY MOUTH EVERY DAY   Multiple Vitamins-Minerals (MULTIVITAMIN ADULTS PO) Take 1 tablet by mouth daily.   NEEDLE, DISP, 18 G (BD DISP NEEDLES) 18G X 1-1/2" MISC 1 mg by Does not apply route  every 14 (fourteen) days.   NEEDLE, DISP, 21 G (BD DISP NEEDLES) 21G X 1-1/2" MISC 1 mg by Does not apply route every 14 (fourteen) days.   nitroGLYCERIN (NITROSTAT) 0.4 MG SL tablet Place 1 tablet (0.4 mg total) under the tongue every 5 (five) minutes x 3 doses as needed for chest pain.   omeprazole (PRILOSEC) 40 MG capsule TAKE 1 CAPSULE (40 MG TOTAL) BY MOUTH DAILY.   sildenafil (VIAGRA) 50 MG tablet Take 1-2 tablets (50-100 mg total) by mouth daily as needed for erectile dysfunction (take at least 30 minutes prior to sexual activity on an empty stomach).   Syringe, Disposable, (2-3CC SYRINGE) 3 ML MISC 1 mg by Does not apply route every 14 (fourteen) days.   Testosterone 20.25 MG/ACT (1.62%) GEL Place 40.5 mg onto the skin daily. This would be two pumps daily   testosterone cypionate (DEPOTESTOSTERONE CYPIONATE) 200 MG/ML injection Inject 200 mg into the muscle every 28 (twenty-eight) days.   [DISCONTINUED] gabapentin (NEURONTIN) 300 MG capsule Take 1 capsule (300 mg total) by mouth 3 (three) times daily.   [DISCONTINUED] tirzepatide (MOUNJARO) 10 MG/0.5ML Pen Inject 10 mg into the skin once a week.   gabapentin (NEURONTIN) 300 MG capsule Take 1 capsule (300 mg total) by mouth 3 (three) times daily.   No facility-administered encounter medications on file as of 04/03/2022.    Surgical History: Past Surgical History:  Procedure  Laterality Date   BONE EXCISION Right 11/21/2020   Procedure: PART EXCISION BONE- PHALANX;  Surgeon: Samara Deist, DPM;  Location: [place];  Service: Podiatry;  Laterality: Right;   CARDIAC CATHETERIZATION N/A 09/2011   [facility]; EF 50% with 30% mid LAD stenosis and no obstructive disease.   CARDIAC CATHETERIZATION  09/2010   [facility]; Mid LAD 40% stenosis; Mid Circumflex:Normal; Mid RCA; Normal   CARDIAC CATHETERIZATION     COLONOSCOPY     COLONOSCOPY WITH PROPOFOL N/A 04/10/2021   Procedure: COLONOSCOPY WITH PROPOFOL;  Surgeon: Jonathon Bellows, MD;  Location: [facility]  ENDOSCOPY;  Service: Gastroenterology;  Laterality: N/A;   ESOPHAGOGASTRODUODENOSCOPY N/A 04/10/2021   Procedure: ESOPHAGOGASTRODUODENOSCOPY (EGD);  Surgeon: Jonathon Bellows, MD;  Location: [facility] ENDOSCOPY;  Service: Gastroenterology;  Laterality: N/A;   ESOPHAGOGASTRODUODENOSCOPY (EGD) WITH PROPOFOL N/A 06/10/2017   Procedure: ESOPHAGOGASTRODUODENOSCOPY (EGD) WITH PROPOFOL;  Surgeon: Jonathon Bellows, MD;  Location: [facility] ENDOSCOPY;  Service: Gastroenterology;  Laterality: N/A;   ESOPHAGOGASTRODUODENOSCOPY (EGD) WITH PROPOFOL N/A 01/01/2022   Procedure: ESOPHAGOGASTRODUODENOSCOPY (EGD) WITH PROPOFOL;  Surgeon: Jonathon Bellows, MD;  Location: [facility] ENDOSCOPY;  Service: Gastroenterology;  Laterality: N/A;   ESOPHAGOGASTRODUODENOSCOPY (EGD) WITH PROPOFOL N/A 01/06/2022   Procedure: ESOPHAGOGASTRODUODENOSCOPY (EGD) WITH PROPOFOL;  Surgeon: Jonathon Bellows, MD;  Location: [facility] ENDOSCOPY;  Service: Gastroenterology;  Laterality: N/A;   HAMMER TOE SURGERY Right 11/21/2020   Procedure: HAMMERTOE CORRECTION;  Surgeon: Samara Deist, DPM;  Location: [place];  Service: Podiatry;  Laterality: Right;   INSERTION OF MESH  04/16/2021   Procedure: INSERTION OF MESH;  Surgeon: Olean Ree, MD;  Location: [facility] ORS;  Service: General;;   KNEE ARTHROSCOPY WITH MEDIAL MENISECTOMY Right 09/16/2012   Procedure: RIGHT KNEE ARTHROSCOPY WITH MEDIAL AND LATERAL MENISECTOMY, CHONDROPLASTY;  Surgeon: Ninetta Lights, MD;  Location: [place];  Service: Orthopedics;  Laterality: Right;  RIGHT KNEE SCOPE MEDIAL MENISCECTOMY   LEFT HEART CATH AND CORONARY ANGIOGRAPHY N/A 06/11/2016   Procedure: Left Heart Cath and Coronary Angiography;  Surgeon: Minna Merritts, MD;  Location: [place] CV LAB;  Service: Cardiovascular;  Laterality: N/A;   UMBILICAL HERNIA REPAIR N/A 04/16/2021   Procedure: HERNIA REPAIR UMBILICAL ADULT, open;  Surgeon: Olean Ree, MD;  Location: [facility] ORS;  Service: General;  Laterality: N/A;     Medical History: Past Medical History:  Diagnosis Date   Abnormal LFTs    Anxiety    Atypical chest pain    Borderline diabetes    CAD (coronary artery disease)    a.) PCI and stent placement (unknown type) to mLAD and mLCx; date unknown. b.) LHC 09/10/2010: 40% mLAD; no intervention. c.) LHC 09/12/2011: EF 60%; 30% ISR mLAD, 40% dLAD, 30% pLCx, 30% ISR mLCx, 40% mRCA; no intervention. d.) LHC 05/23/2014: EF >55%; 30% p-mLAD; no intervention. e.) LHC 06/11/2016: EF 55-65%; LVEDP norm; 30-40% p-mLAD; no intervention.   Carcinoma ([place])    Chronic pain syndrome    Depression    Fundic gland polyps of stomach, benign    Gastritis    GERD (gastroesophageal reflux disease)    Hepatic steatosis    Hyperlipidemia    Hypertension    Migraines    Myocardial infarction [facility]) 2008   was told by PCP   Nonischemic cardiomyopathy ([place])    a.) TTE 09/12/2011: EF 35-45%. b.) LHC 09/12/2011: EF 60%. c.) TTE 06/04/2012: EF 55-60%. d.) LHC 05/23/2014: EF >55%. e.) TTE 06/11/2016: EF 55-60%; G1DD. f.) LHC 06/11/2016: EF 55-65%. g.) TTE 01/08/2018: EF 60-65%.   Obesity  Occlusion and stenosis of bilateral carotid arteries    OSA on CPAP    Osteoarthritis of both knees    Shortness of breath    Squamous cell carcinoma of skin 02/27/2020   left distal lat deltoid - [facility]    Family History: Family History  Problem Relation Age of Onset   Heart disease Father 77       MI   Heart attack Father    Stomach cancer Father    Hypertension Mother    Breast cancer Mother     Social History   Socioeconomic History   Marital status: Married    Spouse name: Not on file   Number of children: 3   Years of education: Not on file   Highest education level: Not on file  Occupational History   Occupation: Librarian, academic  Tobacco Use   Smoking status: Never    Passive exposure: Past   Smokeless tobacco: Former    Types: Chew    Quit date: 1987  Vaping Use   Vaping Use: Never used  Substance  and Sexual Activity   Alcohol use: Not Currently   Drug use: No   Sexual activity: Yes  Other Topics Concern   Not on file  Social History Narrative   Married.  66 yo son.   Wife on disability for bipolar disorder.     Social Determinants of Health   Financial Resource Strain: Not on file  Food Insecurity: Not on file  Transportation Needs: Not on file  Physical Activity: Not on file  Stress: Not on file  Social Connections: Not on file  Intimate Partner Violence: Not on file      Review of Systems  Constitutional:  Positive for fatigue. Negative for chills and unexpected weight change.  HENT:  Negative for congestion, postnasal drip, rhinorrhea, sneezing and sore throat.   Eyes:  Negative for redness.  Respiratory:  Negative for cough, chest tightness and shortness of breath.   Cardiovascular:  Negative for chest pain and palpitations.  Gastrointestinal:  Negative for abdominal pain, anal bleeding, constipation, diarrhea, nausea and vomiting.  Genitourinary:  Negative for dysuria and frequency.  Musculoskeletal:  Positive for arthralgias. Negative for back pain, joint swelling and neck pain.  Skin:  Negative for rash.  Neurological:  Positive for numbness. Negative for tremors.  Hematological:  Negative for adenopathy. Does not bruise/bleed easily.  Psychiatric/Behavioral:  Positive for sleep disturbance. Negative for behavioral problems (Depression) and suicidal ideas. The patient is nervous/anxious.     Vital Signs: BP (!) 148/80 Comment: 159/72  Pulse 80   Temp 98.4 F (36.9 C)   Resp 16   Ht '6\' 2"'$  (1.88 m)   Wt (!) 325 lb 9.6 oz (147.7 kg)   SpO2 95%   BMI 41.80 kg/m    Physical Exam Vitals and nursing note reviewed.  Constitutional:      General: He is not in acute distress.    Appearance: Normal appearance. He is well-developed. He is obese. He is not diaphoretic.  HENT:     Head: Normocephalic and atraumatic.     Mouth/Throat:     Pharynx: No  oropharyngeal exudate.  Eyes:     Pupils: Pupils are equal, round, and reactive to light.  Neck:     Thyroid: No thyromegaly.     Vascular: No JVD.     Trachea: No tracheal deviation.  Cardiovascular:     Rate and Rhythm: Normal rate and regular rhythm.  Heart sounds: Normal heart sounds. No murmur heard.    No friction rub. No gallop.  Pulmonary:     Effort: Pulmonary effort is normal. No respiratory distress.     Breath sounds: No wheezing or rales.  Chest:     Chest wall: No tenderness.  Abdominal:     General: Bowel sounds are normal.     Palpations: Abdomen is soft.  Musculoskeletal:        General: Normal range of motion.     Cervical back: Normal range of motion and neck supple.     Right lower leg: No edema.     Left lower leg: No edema.  Lymphadenopathy:     Cervical: No cervical adenopathy.  Skin:    General: Skin is warm and dry.  Neurological:     Mental Status: He is alert and oriented to person, place, and time.     Cranial Nerves: No cranial nerve deficit.  Psychiatric:        Behavior: Behavior normal.        Thought Content: Thought content normal.        Judgment: Judgment normal.        Assessment/Plan: 1. Essential hypertension Elevated in office, will double amlodipine to '10mg'$  and he will call for script when needed  2. Type 2 diabetes mellitus with hyperglycemia, unspecified whether long term insulin use (HCC) Will start trulicity and titrate up as needed - Dulaglutide (TRULICITY) A999333 0000000 SOPN; Inject 0.75 mg into the skin once a week.  Dispense: 2 mL; Refill: 0  3. Neuropathy involving both lower extremities - gabapentin (NEURONTIN) 300 MG capsule; Take 1 capsule (300 mg total) by mouth 3 (three) times daily.  Dispense: 270 capsule; Refill: 1  4. Morbid obesity with BMI of 40.0-44.9, adult ([place]) Continue to work on diet and exercise and restart trulicity which should help BG and wt  5. OSA (obstructive sleep apnea) Continue  nightly use, will review download at 1 month   General Counseling: [name] understanding of the findings of todays visit and agrees with plan of treatment. I have discussed any further diagnostic evaluation that may be needed or ordered today. We also reviewed his medications today. he has been encouraged to call the office with any questions or concerns that should arise related to todays visit.    No orders of the defined types were placed in this encounter.   Meds ordered this encounter  Medications   Dulaglutide (TRULICITY) A999333 0000000 SOPN    Sig: Inject 0.75 mg into the skin once a week.    Dispense:  2 mL    Refill:  0   gabapentin (NEURONTIN) 300 MG capsule    Sig: Take 1 capsule (300 mg total) by mouth 3 (three) times daily.    Dispense:  270 capsule    Refill:  1    This patient was seen by Drema Dallas, PA-C in collaboration with Dr. Clayborn Bigness as a part of collaborative care agreement.   Total time spent:30 Minutes Time spent includes review of chart, medications, test results, and follow up plan with the patient.      Dr Lavera Guise Internal medicine

## 2022-04-04 ENCOUNTER — Ambulatory Visit: Payer: 59 | Attending: Cardiology | Admitting: Cardiology

## 2022-04-04 ENCOUNTER — Encounter: Payer: Self-pay | Admitting: Cardiology

## 2022-04-04 VITALS — BP 144/80 | HR 73 | Ht 74.0 in | Wt 328.2 lb

## 2022-04-04 DIAGNOSIS — E7800 Pure hypercholesterolemia, unspecified: Secondary | ICD-10-CM | POA: Diagnosis not present

## 2022-04-04 DIAGNOSIS — I10 Essential (primary) hypertension: Secondary | ICD-10-CM

## 2022-04-04 DIAGNOSIS — I2510 Atherosclerotic heart disease of native coronary artery without angina pectoris: Secondary | ICD-10-CM

## 2022-04-04 MED ORDER — NEXLIZET 180-10 MG PO TABS: 1.0000 | ORAL_TABLET | Freq: Once | ORAL | 0 refills | Status: DC

## 2022-04-04 MED ORDER — AMLODIPINE BESYLATE 10 MG PO TABS: 10.0000 mg | ORAL_TABLET | Freq: Every day | ORAL | 3 refills | Status: DC

## 2022-04-04 NOTE — Progress Notes (Signed)
Cardiology Office Note:    Date:  04/04/2022   ID:  ARLESS MOSCHELLA, DOB 1965/10/02, MRN [id number]  PCP:  McDonough, Lauren K, [place] Providers Cardiologist:  Kate Sable, MD     Referring MD: Mylinda Latina, PA*   Chief Complaint  Patient presents with   New Patient (Initial Visit)    Reestablish care, CAD, former [place] p/t, some swelling in legs and feet    History of Present Illness:    TAWFIQ BOQUIST is a 57 y.o. male with a hx of nonobstructive CAD (40% pLAD), hyperlipidemia presenting to reestablish care.  Previously seen in the clinic about 5 years ago with symptoms of chest pain.  About the time left heart catheter revealed nonobstructive CAD.  He denies chest pain or shortness of breath.  Compliant with medications as prescribed.  He is not tolerant to statins, due to muscle aches.  Blood pressure at home ranges in the 140s to 150s.   Echo 2019 EF 60 to 65% Left heart cath 2018 nonobstructive CAD 40% proximal LAD.   Past Medical History:  Diagnosis Date   Abnormal LFTs    Anxiety    Atypical chest pain    Borderline diabetes    CAD (coronary artery disease)    a.) PCI and stent placement (unknown type) to mLAD and mLCx; date unknown. b.) LHC 09/10/2010: 40% mLAD; no intervention. c.) LHC 09/12/2011: EF 60%; 30% ISR mLAD, 40% dLAD, 30% pLCx, 30% ISR mLCx, 40% mRCA; no intervention. d.) LHC 05/23/2014: EF >55%; 30% p-mLAD; no intervention. e.) LHC 06/11/2016: EF 55-65%; LVEDP norm; 30-40% p-mLAD; no intervention.   Carcinoma ([place])    Chronic pain syndrome    Depression    Fundic gland polyps of stomach, benign    Gastritis    GERD (gastroesophageal reflux disease)    Hepatic steatosis    Hyperlipidemia    Hypertension    Migraines    Myocardial infarction [facility]) 2008   was told by PCP   Nonischemic cardiomyopathy ([place])    a.) TTE 09/12/2011: EF 35-45%. b.) LHC 09/12/2011: EF 60%. c.) TTE 06/04/2012: EF 55-60%. d.) LHC  05/23/2014: EF >55%. e.) TTE 06/11/2016: EF 55-60%; G1DD. f.) LHC 06/11/2016: EF 55-65%. g.) TTE 01/08/2018: EF 60-65%.   Obesity    Occlusion and stenosis of bilateral carotid arteries    OSA on CPAP    Osteoarthritis of both knees    Shortness of breath    Squamous cell carcinoma of skin 02/27/2020   left distal lat deltoid - [facility]    Past Surgical History:  Procedure Laterality Date   BONE EXCISION Right 11/21/2020   Procedure: PART EXCISION BONE- PHALANX;  Surgeon: Samara Deist, DPM;  Location: [place];  Service: Podiatry;  Laterality: Right;   CARDIAC CATHETERIZATION N/A 09/2011   [facility]; EF 50% with 30% mid LAD stenosis and no obstructive disease.   CARDIAC CATHETERIZATION  09/2010   [facility]; Mid LAD 40% stenosis; Mid Circumflex:Normal; Mid RCA; Normal   CARDIAC CATHETERIZATION     COLONOSCOPY     COLONOSCOPY WITH PROPOFOL N/A 04/10/2021   Procedure: COLONOSCOPY WITH PROPOFOL;  Surgeon: Jonathon Bellows, MD;  Location[facility] END[facility]COPY;  Service: Gastroenterology;  Laterality: N/A;   ESOPHAGOGASTRODUODENOSCOPY N/A 04/10/2021   Procedure: ESOPHAGOGASTRODUODENOSCOPY (EGD);  Surgeon: Jonathon Bellows, MD;  Location: [facility] ENDOSCOPY;  Service: Gastroenterology;  Laterality: N/A;   ESOPHAGOGASTRODUODENOSCOPY (EGD) WITH PROPOFOL N/A 06/10/2017   Procedure: ESOPHAGOGASTRODUODENOSCOPY (EGD) WITH PROPOFOL;  Surgeon: Vicente Males,  Bailey Mech, MD;  Location: [place];  Service: Gastroenterology;  Laterality: N/A;   ESOPHAGOGASTRODUODENOSCOPY (EGD) WITH PROPOFOL N/A 01/01/2022   Procedure: ESOPHAGOGASTRODUODENOSCOPY (EGD) WITH PROPOFOL;  Surgeon: Jonathon Bellows, MD;  Location: [facility] ENDOSCOPY;  Service: Gastroenterology;  Laterality: N/A;   ESOPHAGOGASTRODUODENOSCOPY (EGD) WITH PROPOFOL N/A 01/06/2022   Procedure: ESOPHAGOGASTRODUODENOSCOPY (EGD) WITH PROPOFOL;  Surgeon: Jonathon Bellows, MD;  Location: [facility] ENDOSCOPY;  Service: Gastroenterology;  Laterality: N/A;   HAMMER TOE SURGERY Right 11/21/2020   Procedure:  HAMMERTOE CORRECTION;  Surgeon: Samara Deist, DPM;  Location: [place];  Service: Podiatry;  Laterality: Right;   INSERTION OF MESH  04/16/2021   Procedure: INSERTION OF MESH;  Surgeon: Olean Ree, MD;  Location: [facility] ORS;  Service: General;;   KNEE ARTHROSCOPY WITH MEDIAL MENISECTOMY Right 09/16/2012   Procedure: RIGHT KNEE ARTHROSCOPY WITH MEDIAL AND LATERAL MENISECTOMY, CHONDROPLASTY;  Surgeon: Ninetta Lights, MD;  Location: [place];  Service: Orthopedics;  Laterality: Right;  RIGHT KNEE SCOPE MEDIAL MENISCECTOMY   LEFT HEART CATH AND CORONARY ANGIOGRAPHY N/A 06/11/2016   Procedure: Left Heart Cath and Coronary Angiography;  Surgeon: Minna Merritts, MD;  Location: [address] CV LAB;  Service: Cardiovascular;  Laterality: N/A;   UMBILICAL HERNIA REPAIR N/A 04/16/2021   Procedure: HERNIA REPAIR UMBILICAL ADULT, open;  Surgeon: Olean Ree, MD;  Location: [facility] ORS;  Service: General;  Laterality: N/A;    Current Medications: Current Meds  Medication Sig   amLODipine (NORVASC) 10 MG tablet Take 1 tablet (10 mg total) by mouth daily.   Bempedoic Acid-Ezetimibe (NEXLIZET) 180-10 MG TABS Take 1 tablet by mouth once for 1 dose.   busPIRone (BUSPAR) 5 MG tablet TAKE 1 TABLET BY MOUTH TWICE A DAY   citalopram (CELEXA) 40 MG tablet TAKE 1 TABLET BY MOUTH EVERY DAY   Dulaglutide (TRULICITY) A999333 0000000 SOPN Inject 0.75 mg into the skin once a week.   furosemide (LASIX) 20 MG tablet TAKE 1 TABLET BY MOUTH EVERY DAY AS NEEDED FOR LEG SWELLING   gabapentin (NEURONTIN) 300 MG capsule Take 1 capsule (300 mg total) by mouth 3 (three) times daily.   lisinopril-hydrochlorothiazide (ZESTORETIC) 20-25 MG tablet TAKE 1 TABLET BY MOUTH EVERY DAY   Multiple Vitamins-Minerals (MULTIVITAMIN ADULTS PO) Take 1 tablet by mouth daily.   nitroGLYCERIN (NITROSTAT) 0.4 MG SL tablet Place 1 tablet (0.4 mg total) under the tongue every 5 (five) minutes x 3 doses as needed for chest  pain.   sildenafil (VIAGRA) 50 MG tablet Take 1-2 tablets (50-100 mg total) by mouth daily as needed for erectile dysfunction (take at least 30 minutes prior to sexual activity on an empty stomach).   Testosterone 20.25 MG/ACT (1.62%) GEL Place 40.5 mg onto the skin daily. This would be two pumps daily   [DISCONTINUED] amLODipine (NORVASC) 5 MG tablet Take 5 mg by mouth daily.     Allergies:   Statins   Social History   Socioeconomic History   Marital status: Married    Spouse name: Not on file   Number of children: 3   Years of education: Not on file   Highest education level: Not on file  Occupational History   Occupation: Librarian, academic  Tobacco Use   Smoking status: Never    Passive exposure: Past   Smokeless tobacco: Former    Types: Chew    Quit date: 1987  Vaping Use   Vaping Use: Never used  Substance and Sexual Activity   Alcohol use: Not Currently  Drug use: No   Sexual activity: Yes  Other Topics Concern   Not on file  Social History Narrative   Married.  57 yo son.   Wife on disability for bipolar disorder.     Social Determinants of Health   Financial Resource Strain: Not on file  Food Insecurity: Not on file  Transportation Needs: Not on file  Physical Activity: Not on file  Stress: Not on file  Social Connections: Not on file     Family History: The patient's family history includes Breast cancer in his mother; Heart attack in his father; Heart disease (age of onset: 33) in his father; Hypertension in his mother; Stomach cancer in his father.  ROS:   Please see the history of present illness.     All other systems reviewed and are negative.  EKGs/Labs/Other Studies Reviewed:    The following studies were reviewed today:   EKG:  EKG is  ordered today.  The ekg ordered today demonstrates normal sinus rhythm, normal ECG.  Recent Labs: 04/24/2021: B Natriuretic Peptide 27.9 09/10/2021: Platelets 239; TSH 3.920 12/09/2021: ALT 27; BUN 11;  Creatinine, Ser 1.00; Potassium 3.9; Sodium 140 01/08/2022: Hemoglobin 16.2  Recent Lipid Panel    Component Value Date/Time   CHOL 194 09/10/2021 0842   CHOL 205 (H) 04/01/2021 1335   TRIG 334 (H) 09/10/2021 0842   TRIG 239 (H) 04/01/2021 1335   HDL 35 (L) 09/10/2021 0842   HDL 34 (L) 09/13/2011 0307   CHOLHDL 6.6 01/07/2018 0554   VLDL 48 (H) 01/07/2018 0554   VLDL 27 09/13/2011 0307   LDLCALC 102 (H) 09/10/2021 0842   LDLCALC 115 (H) 09/13/2011 0307   LDLDIRECT 145.9 11/08/2013 0911     Risk Assessment/Calculations:     HYPERTENSION CONTROL Vitals:   04/04/22 1352 04/04/22 1400  BP: (!) 154/86 (!) 144/80    The patient's blood pressure is elevated above target today.  In order to address the patient's elevated BP: A current anti-hypertensive medication was adjusted today.            Physical Exam:    VS:  BP (!) 144/80 (BP Location: Right Arm)   Pulse 73   Ht '6\' 2"'$  (1.88 m)   Wt (!) 328 lb 3.2 oz (148.9 kg)   SpO2 95%   BMI 42.14 kg/m     Wt Readings from Last 3 Encounters:  04/04/22 (!) 328 lb 3.2 oz (148.9 kg)  04/03/22 (!) 325 lb 9.6 oz (147.7 kg)  01/23/22 280 lb (127 kg)     GEN:  Well nourished, well developed in no acute distress HEENT: Normal NECK: No JVD; No carotid bruits CARDIAC: RRR, no murmurs, rubs, gallops RESPIRATORY:  Clear to auscultation without rales, wheezing or rhonchi  ABDOMEN: Soft, non-tender, non-distended MUSCULOSKELETAL:  No edema; varicose veins in lower extremities noted. SKIN: Warm and dry NEUROLOGIC:  Alert and oriented x 3 PSYCHIATRIC:  Normal affect   ASSESSMENT:    1. Coronary artery disease, unspecified vessel or lesion type, unspecified whether angina present, unspecified whether native or transplanted heart   2. Primary hypertension   3. Pure hypercholesterolemia   4. Morbid obesity ([place])    PLAN:    In order of problems listed above:  Nonobstructive CAD 40% proximal LAD.  Denies chest pain.  Continue  aspirin 81 mg, start Nexlizet.  Get echocardiogram. Hypertension, BP elevated.  Increase Norvasc to 10 mg daily, continue HCTZ 25, lisinopril 20.  If BP stays elevated at  follow-up visit, add lisinopril 20 mg. Hyperlipidemia, not tolerant to statins.  Start Nexlizet. Morbid obesity, low-calorie diet, weight loss advised.  Follow-up after echocardiogram       Medication Adjustments/Labs and Tests Ordered: Current medicines are reviewed at length with the patient today.  Concerns regarding medicines are outlined above.  Orders Placed This Encounter  Procedures   EKG 12-Lead   ECHOCARDIOGRAM COMPLETE   Meds ordered this encounter  Medications   Bempedoic Acid-Ezetimibe (NEXLIZET) 180-10 MG TABS    Sig: Take 1 tablet by mouth once for 1 dose.    Dispense:  1 tablet    Refill:  0   amLODipine (NORVASC) 10 MG tablet    Sig: Take 1 tablet (10 mg total) by mouth daily.    Dispense:  90 tablet    Refill:  3    Patient Instructions  Medication Instructions:  Your physician has recommended you make the following change in your medication: START - amLODipine (NORVASC) 10 MG tablet - Take 1 tablet (10 mg total) by mouth daily START - Bempedoic Acid-Ezetimibe (NEXLIZET) 180-10 MG TABS - Take 1 tablet by mouth once for 1 dose  *If you need a refill on your cardiac medications before your next appointment, please call your pharmacy*   Lab Work: -None ordered If you have labs (blood work) drawn today and your tests are completely normal, you will receive your results only by: [place] (if you have MyChart) OR A paper copy in the mail If you have any lab test that is abnormal or we need to change your treatment, we will call you to review the results.   Testing/Procedures: Your physician has requested that you have an echocardiogram. Echocardiography is a painless test that uses sound waves to create images of your heart. It provides your doctor with information about the size and  shape of your heart and how well your heart's chambers and valves are working. This procedure takes approximately one hour. There are no restrictions for this procedure. Please do NOT wear cologne, perfume, aftershave, or lotions (deodorant is allowed). Please arrive 15 minutes prior to your appointment time.   Follow-Up: At [facility], you and your health needs are our priority.  As part of our continuing mission to provide you with exceptional heart care, we have created designated Provider Care Teams.  These Care Teams include your primary Cardiologist (physician) and Advanced Practice Providers (APPs -  Physician Assistants and Nurse Practitioners) who all work together to provide you with the care you need, when you need it.  We recommend signing up for the patient portal called "MyChart".  Sign up information is provided on this After Visit Summary.  MyChart is used to connect with patients for Virtual Visits (Telemedicine).  Patients are able to view lab/test results, encounter notes, upcoming appointments, etc.  Non-urgent messages can be sent to your provider as well.   To learn more about what you can do with MyChart, go to [email or web address].    Your next appointment:   After echocardiogram   Provider:   You may see Kate Sable, MD or one of the following Advanced Practice Providers on your designated Care Team:   Murray Hodgkins, NP Christell Faith, PA-C Cadence Kathlen Mody, PA-C Gerrie Nordmann, NP  Other Instructions -None    Signed, Kate Sable, MD  04/04/2022 3:23 PM    [place]

## 2022-04-04 NOTE — Patient Instructions (Signed)
Medication Instructions:  Your physician has recommended you make the following change in your medication: START - amLODipine (NORVASC) 10 MG tablet - Take 1 tablet (10 mg total) by mouth daily START - Bempedoic Acid-Ezetimibe (NEXLIZET) 180-10 MG TABS - Take 1 tablet by mouth once for 1 dose  *If you need a refill on your cardiac medications before your next appointment, please call your pharmacy*   Lab Work: -None ordered If you have labs (blood work) drawn today and your tests are completely normal, you will receive your results only by: [place] (if you have MyChart) OR A paper copy in the mail If you have any lab test that is abnormal or we need to change your treatment, we will call you to review the results.   Testing/Procedures: Your physician has requested that you have an echocardiogram. Echocardiography is a painless test that uses sound waves to create images of your heart. It provides your doctor with information about the size and shape of your heart and how well your heart's chambers and valves are working. This procedure takes approximately one hour. There are no restrictions for this procedure. Please do NOT wear cologne, perfume, aftershave, or lotions (deodorant is allowed). Please arrive 15 minutes prior to your appointment time.   Follow-Up: At [facility], you and your health needs are our priority.  As part of our continuing mission to provide you with exceptional heart care, we have created designated Provider Care Teams.  These Care Teams include your primary Cardiologist (physician) and Advanced Practice Providers (APPs -  Physician Assistants and Nurse Practitioners) who all work together to provide you with the care you need, when you need it.  We recommend signing up for the patient portal called "MyChart".  Sign up information is provided on this After Visit Summary.  MyChart is used to connect with patients for Virtual Visits (Telemedicine).   Patients are able to view lab/test results, encounter notes, upcoming appointments, etc.  Non-urgent messages can be sent to your provider as well.   To learn more about what you can do with MyChart, go to [email or web address].    Your next appointment:   After echocardiogram   Provider:   You may see Kate Sable, MD or one of the following Advanced Practice Providers on your designated Care Team:   Murray Hodgkins, NP Christell Faith, PA-C Cadence Kathlen Mody, PA-C Gerrie Nordmann, NP  Other Instructions -None

## 2022-04-09 ENCOUNTER — Ambulatory Visit: Payer: 59 | Attending: Cardiology

## 2022-04-09 DIAGNOSIS — I2510 Atherosclerotic heart disease of native coronary artery without angina pectoris: Secondary | ICD-10-CM | POA: Diagnosis not present

## 2022-04-09 LAB — ECHOCARDIOGRAM COMPLETE
AR max vel: 3.35 cm2
AV Area VTI: 3.36 cm2
AV Area mean vel: 3.33 cm2
AV Mean grad: 2 mmHg
AV Peak grad: 4.1 mmHg
Ao pk vel: 1.01 m/s
Area-P 1/2: 3.37 cm2
Calc EF: 48.4 %
S' Lateral: 3.7 cm
Single Plane A2C EF: 48.9 %
Single Plane A4C EF: 47.3 %

## 2022-04-14 ENCOUNTER — Other Ambulatory Visit: Payer: 59

## 2022-04-14 ENCOUNTER — Other Ambulatory Visit: Payer: Self-pay

## 2022-04-14 DIAGNOSIS — E291 Testicular hypofunction: Secondary | ICD-10-CM

## 2022-04-14 DIAGNOSIS — R972 Elevated prostate specific antigen [PSA]: Secondary | ICD-10-CM

## 2022-04-15 LAB — HEMOGLOBIN AND HEMATOCRIT, BLOOD
Hematocrit: 44.1 % (ref 37.5–51.0)
Hemoglobin: 14.8 g/dL (ref 13.0–17.7)

## 2022-04-15 LAB — PSA, TOTAL AND FREE
PSA, Free Pct: 20 %
PSA, Free: 0.28 ng/mL
Prostate Specific Ag, Serum: 1.4 ng/mL (ref 0.0–4.0)

## 2022-04-15 LAB — TESTOSTERONE: Testosterone: 81 ng/dL — ABNORMAL LOW (ref 264–916)

## 2022-04-16 NOTE — Progress Notes (Deleted)
01/23/2022 10:05 PM   Gerald [name] 08-10-1965 [id number]  Referring provider: Mylinda Latina, PA-C [place],  [place] [address]  Urological history: 1. ED -contributing factors of age, testosterone deficiency, diabetes, depression, HTN, CAD, sleep apnea, HLD and anxiety -SHIM *** -sildenafil 50 mg, on-demand-dosing  2. BPH w/ LU TS -PSA (11/2021) 3.3  -I PSS ***  3.  Testosterone deficiency -Contributing factors of age, diabetes, sleep apnea (CPAP) and night shift worker -Testosterone level (04/2022) 81 -Hemoglobin/hematocrit (04/2022) 14.8/44.1 -Testosterone gel 20.25 mg/ACT (1.62%) two pumps daily    Chief Complaint  Patient presents with   Follow-up    HPI: Gerald [name] is a 57 y.o. male who presents today for 6 months follow up.   I PSS ***    Score:  1-7 Mild 8-19 Moderate 20-35 Severe   SHIM ***   Score: 1-7 Severe ED 8-11 Moderate ED 12-16 Mild-Moderate ED 17-21 Mild ED 22-25 No ED   PMH: Past Medical History:  Diagnosis Date   Abnormal LFTs    Anxiety    Atypical chest pain    Borderline diabetes    CAD (coronary artery disease)    a.) PCI and stent placement (unknown type) to mLAD and mLCx; date unknown. b.) LHC 09/10/2010: 40% mLAD; no intervention. c.) LHC 09/12/2011: EF 60%; 30% ISR mLAD, 40% dLAD, 30% pLCx, 30% ISR mLCx, 40% mRCA; no intervention. d.) LHC 05/23/2014: EF >55%; 30% p-mLAD; no intervention. e.) LHC 06/11/2016: EF 55-65%; LVEDP norm; 30-40% p-mLAD; no intervention.   Carcinoma ([place])    Chronic pain syndrome    Depression    Fundic gland polyps of stomach, benign    Gastritis    GERD (gastroesophageal reflux disease)    Hepatic steatosis    Hyperlipidemia    Hypertension    Migraines    Myocardial infarction [facility]) 2008   was told by PCP   Nonischemic cardiomyopathy ([place])    a.) TTE 09/12/2011: EF 35-45%. b.) LHC 09/12/2011: EF 60%. c.) TTE 06/04/2012: EF 55-60%. d.) LHC 05/23/2014: EF  >55%. e.) TTE 06/11/2016: EF 55-60%; G1DD. f.) LHC 06/11/2016: EF 55-65%. g.) TTE 01/08/2018: EF 60-65%.   Obesity    Occlusion and stenosis of bilateral carotid arteries    OSA on CPAP    Osteoarthritis of both knees    Shortness of breath    Squamous cell carcinoma of skin 02/27/2020   left distal lat deltoid - [facility]    Surgical History: Past Surgical History:  Procedure Laterality Date   BONE EXCISION Right 11/21/2020   Procedure: PART EXCISION BONE- PHALANX;  Surgeon: Samara Deist, DPM;  Location: [place];  Service: Podiatry;  Laterality: Right;   CARDIAC CATHETERIZATION N/A 09/2011   [facility]; EF 50% with 30% mid LAD stenosis and no obstructive disease.   CARDIAC CATHETERIZATION  09/2010   [facility]; Mid LAD 40% stenosis; Mid Circumflex:Normal; Mid RCA; Normal   CARDIAC CATHETERIZATION     COLONOSCOPY     COLONOSCOPY WITH PROPOFOL N/A 04/10/2021   Procedure: COLONOSCOPY WITH PROPOFOL;  Surgeon: Jonathon Bellows, MD;  Location: [facility] ENDOSCOPY;  Service: Gastroenterology;  Laterality: N/A;   ESOPHAGOGASTRODUODENOSCOPY N/A 04/10/2021   Procedure: ESOPHAGOGASTRODUODENOSCOPY (EGD);  Surgeon: Jonathon Bellows, MD;  Location: [facility] ENDOSCOPY;  Service: Gastroenterology;  Laterality: N/A;   ESOPHAGOGASTRODUODENOSCOPY (EGD) WITH PROPOFOL N/A 06/10/2017   Procedure: ESOPHAGOGASTRODUODENOSCOPY (EGD) WITH PROPOFOL;  Surgeon: Jonathon Bellows, MD;  Location: [facility] ENDOSCOPY;  Service: Gastroenterology;  Laterality: N/A;   ESOPHAGOGASTRODUODENOSCOPY (EGD) WITH  PROPOFOL N/A 01/01/2022   Procedure: ESOPHAGOGASTRODUODENOSCOPY (EGD) WITH PROPOFOL;  Surgeon: Jonathon Bellows, MD;  Location: [facility] ENDOSCOPY;  Service: Gastroenterology;  Laterality: N/A;   ESOPHAGOGASTRODUODENOSCOPY (EGD) WITH PROPOFOL N/A 01/06/2022   Procedure: ESOPHAGOGASTRODUODENOSCOPY (EGD) WITH PROPOFOL;  Surgeon: Jonathon Bellows, MD;  Location: [facility] ENDOSCOPY;  Service: Gastroenterology;  Laterality: N/A;   HAMMER TOE SURGERY Right 11/21/2020   Procedure:  HAMMERTOE CORRECTION;  Surgeon: Samara Deist, DPM;  Location: [place];  Service: Podiatry;  Laterality: Right;   INSERTION OF MESH  04/16/2021   Procedure: INSERTION OF MESH;  Surgeon: Olean Ree, MD;  Location: [facility] ORS;  Service: General;;   KNEE ARTHROSCOPY WITH MEDIAL MENISECTOMY Right 09/16/2012   Procedure: RIGHT KNEE ARTHROSCOPY WITH MEDIAL AND LATERAL MENISECTOMY, CHONDROPLASTY;  Surgeon: Ninetta Lights, MD;  Location: [place];  Service: Orthopedics;  Laterality: Right;  RIGHT KNEE SCOPE MEDIAL MENISCECTOMY   LEFT HEART CATH AND CORONARY ANGIOGRAPHY N/A 06/11/2016   Procedure: Left Heart Cath and Coronary Angiography;  Surgeon: Minna Merritts, MD;  Location: [place] CV LAB;  Service: Cardiovascular;  Laterality: N/A;   UMBILICAL HERNIA REPAIR N/A 04/16/2021   Procedure: HERNIA REPAIR UMBILICAL ADULT, open;  Surgeon: Olean Ree, MD;  Location: [facility] ORS;  Service: General;  Laterality: N/A;    Home Medications:  Allergies as of 01/23/2022       Reactions   Statins Rash, Hives           Medication List        Accurate as of January 23, 2022 10:05 PM. If you have any questions, ask your nurse or doctor.          STOP taking these medications    acetaminophen 500 MG tablet Commonly known as: TYLENOL Stopped by: Carita Sollars, PA-C   amLODipine 5 MG tablet Commonly known as: NORVASC Stopped by: Zara Council, PA-C   clomiPHENE 50 MG tablet Commonly known as: CLOMID Stopped by: Eller Sweis, PA-C       TAKE these medications    2-3CC SYRINGE 3 ML Misc 1 mg by Does not apply route every 14 (fourteen) days.   BD Disp Needles 18G X 1-1/2" Misc Generic drug: NEEDLE (DISP) 18 G 1 mg by Does not apply route every 14 (fourteen) days.   BD Disp Needles 21G X 1-1/2" Misc Generic drug: NEEDLE (DISP) 21 G 1 mg by Does not apply route every 14 (fourteen) days.   buPROPion 150 MG 24 hr tablet Commonly known as:  WELLBUTRIN XL Take 150 mg by mouth daily.   busPIRone 5 MG tablet Commonly known as: BUSPAR TAKE 1 TABLET BY MOUTH TWICE A DAY   citalopram 40 MG tablet Commonly known as: CELEXA TAKE 1 TABLET BY MOUTH EVERY DAY   furosemide 20 MG tablet Commonly known as: LASIX TAKE 1 TABLET BY MOUTH EVERY DAY AS NEEDED FOR LEG SWELLING   gabapentin 300 MG capsule Commonly known as: NEURONTIN Take 1 capsule (300 mg total) by mouth 3 (three) times daily.   HYDROcodone-acetaminophen 5-325 MG tablet Commonly known as: NORCO/VICODIN Take 1 tablet by mouth every 6 (six) hours as needed for moderate pain. Started by: Zara Council, PA-C   lisinopril-hydrochlorothiazide 20-25 MG tablet Commonly known as: ZESTORETIC TAKE 1 TABLET BY MOUTH EVERY DAY   MULTIVITAMIN ADULTS PO Take 1 tablet by mouth daily.   nitroGLYCERIN 0.4 MG SL tablet Commonly known as: NITROSTAT Place 1 tablet (0.4 mg total) under the tongue every 5 (five) minutes x  3 doses as needed for chest pain.   omeprazole 40 MG capsule Commonly known as: PRILOSEC Take 1 capsule (40 mg total) by mouth daily.   sildenafil 50 MG tablet Commonly known as: VIAGRA Take 1-2 tablets (50-100 mg total) by mouth daily as needed for erectile dysfunction (take at least 30 minutes prior to sexual activity on an empty stomach).   Testosterone 20.25 MG/ACT (1.62%) Gel Place 40.5 mg onto the skin daily. This would be two pumps daily   tirzepatide 10 MG/0.5ML Pen Commonly known as: MOUNJARO Inject 10 mg into the skin once a week.        Allergies:  Allergies  Allergen Reactions   Statins Rash and Hives         Family History: Family History  Problem Relation Age of Onset   Heart disease Father 91       MI   Heart attack Father    Stomach cancer Father    Hypertension Mother    Breast cancer Mother     Social History:  reports that he has never smoked. He has been exposed to tobacco smoke. He quit smokeless tobacco use about  36 years ago.  His smokeless tobacco use included chew. He reports that he does not currently use alcohol. He reports that he does not use drugs.  ROS: Pertinent ROS in HPI  Physical Exam: BP 123/81   Pulse 99   Ht '6\' 2"'$  (1.88 m)   Wt 280 lb (127 kg)   BMI 35.95 kg/m   Constitutional:  Well nourished. Alert and oriented, No acute distress. HEENT: [place] AT, moist mucus membranes.  Trachea midline Cardiovascular: No clubbing, cyanosis, or edema. Respiratory: Normal respiratory effort, no increased work of breathing. GU: No CVA tenderness.  No bladder fullness or masses.  Patient with circumcised/uncircumcised phallus. ***Foreskin easily retracted***  Urethral meatus is patent.  No penile discharge. No penile lesions or rashes. Scrotum without lesions, cysts, rashes and/or edema.  Testicles are located scrotally bilaterally. No masses are appreciated in the testicles. Left and right epididymis are normal. Rectal: Patient with  normal sphincter tone. Anus and perineum without scarring or rashes. No rectal masses are appreciated. Prostate is approximately *** grams, *** nodules are appreciated. Seminal vesicles are normal. Neurologic: Grossly intact, no focal deficits, moving all 4 extremities. Psychiatric: Normal mood and affect.    Laboratory Data: Results for orders placed or performed in visit on 04/14/22  Testosterone  Result Value Ref Range   Testosterone 81 (L) 264 - 916 ng/dL  PSA, total and free  Result Value Ref Range   Prostate Specific Ag, Serum 1.4 0.0 - 4.0 ng/mL   PSA, Free 0.28 N/A ng/mL   PSA, Free Pct 20.0 %  Hemoglobin and Hematocrit, Blood  Result Value Ref Range   Hemoglobin 14.8 13.0 - 17.7 g/dL   Hematocrit 44.1 37.5 - 51.0 %  I have reviewed the labs.   Pertinent Imaging: N/A  Assessment & Plan:    1. Testosterone deficiency  -testosterone levels are therapeutic/subtherapeutic -H & H *** -continue ***  2. BPH with LUTS -PSA stable -DRE benign -UA  benign -PVR < 300 cc -symptoms - *** -most bothersome symptoms are *** -continue conservative management, avoiding bladder irritants and timed voiding's -Initiate alpha-blocker (***), discussed side effects *** -Initiate 5 alpha reductase inhibitor (***), discussed side effects *** -Continue tamsulosin 0.4 mg daily, alfuzosin 10 mg daily, Rapaflo 8 mg daily, terazosin, doxazosin, Cialis 5 mg daily and finasteride 5 mg  daily, dutasteride 0.5 mg daily***:refills given -Cannot tolerate medication or medication failure, schedule cystoscopy ***  3. Erectile dysfunction:    - I explained that conditions like diabetes, hypertension, coronary artery disease, peripheral vascular disease, smoking, alcohol consumption, age, sleep apnea and BPH can diminish the ability to have an erection - I explained the ED may be a risk marker for underlying CVD and he should follow up with PCP for further studies *** - A recent study published in Sex Med 2018 Apr 13 revealed moderate to vigorous aerobic exercise for 40 minutes 4 times per week can decrease erectile problems caused by physical inactivity, obesity, hypertension, metabolic syndrome and/or cardiovascular diseases *** - We discussed trying a *** different PDE5 inhibitor, intra-urethral suppositories, intracavernous vasoactive drug injection therapy, vacuum erection devices and penile prosthesis implantation     Return for Pending PA results .  These notes generated with voice recognition software. I apologize for typographical errors.  [place], [place] [address]  [place], [place] [address] [phone]

## 2022-04-17 ENCOUNTER — Ambulatory Visit: Payer: 59 | Admitting: Urology

## 2022-04-17 DIAGNOSIS — N138 Other obstructive and reflux uropathy: Secondary | ICD-10-CM

## 2022-04-17 DIAGNOSIS — E291 Testicular hypofunction: Secondary | ICD-10-CM

## 2022-04-17 DIAGNOSIS — N529 Male erectile dysfunction, unspecified: Secondary | ICD-10-CM

## 2022-05-01 ENCOUNTER — Encounter: Payer: Self-pay | Admitting: Physician Assistant

## 2022-05-01 ENCOUNTER — Ambulatory Visit: Payer: 59 | Admitting: Physician Assistant

## 2022-05-01 ENCOUNTER — Telehealth: Payer: Self-pay | Admitting: Physician Assistant

## 2022-05-01 VITALS — BP 154/80 | HR 77 | Temp 98.5°F | Resp 16 | Ht 74.0 in | Wt 330.2 lb

## 2022-05-01 DIAGNOSIS — F331 Major depressive disorder, recurrent, moderate: Secondary | ICD-10-CM | POA: Diagnosis not present

## 2022-05-01 DIAGNOSIS — F411 Generalized anxiety disorder: Secondary | ICD-10-CM

## 2022-05-01 DIAGNOSIS — E1165 Type 2 diabetes mellitus with hyperglycemia: Secondary | ICD-10-CM | POA: Diagnosis not present

## 2022-05-01 DIAGNOSIS — I10 Essential (primary) hypertension: Secondary | ICD-10-CM | POA: Diagnosis not present

## 2022-05-01 DIAGNOSIS — Z6841 Body Mass Index (BMI) 40.0 and over, adult: Secondary | ICD-10-CM

## 2022-05-01 MED ORDER — TRULICITY 1.5 MG/0.5ML ~~LOC~~ SOAJ: 1.5000 mg | SUBCUTANEOUS | 0 refills | Status: DC

## 2022-05-01 MED ORDER — LISINOPRIL 20 MG PO TABS: 20.0000 mg | ORAL_TABLET | Freq: Every day | ORAL | 3 refills | Status: DC

## 2022-05-01 NOTE — Telephone Encounter (Signed)
Awaiting 05/01/22 office notes for [facility] referral-[place]

## 2022-05-01 NOTE — Progress Notes (Signed)
[facility] [address] [place], [place] [address]  Internal MEDICINE  Office Visit Note  Patient Name: Gerald [name]  [id number]  [id number]  Date of Service: 05/09/2022  Chief Complaint  Patient presents with   Follow-up   Hyperlipidemia   Hypertension   Depression   Gastroesophageal Reflux   Quality Metric Gaps    Shingles Vaccines    HPI Pt is here for routine follow up -Bp at home still fluctuating. Thinks largely due to his stress and how he is feeling. He is anxious and depressed. -Still gaining weight, no motivation to exercise or do things. Wont even play more than 1 round of golf which he used to enjoy regularly.  -Previously didn't tolerate increased wellbutrin to  so back at . Did discuss increased buspar up to  and see how this does. He will also establish with psychiatry given worsening symptoms. Denies SI/HI. -Did see cardiology and they are ordering echo and adjusting meds for BP as well. Will go ahead and add  Lisinopril to take with his zestoretic to help bp further -Doing well with new cpap -Will increase trulicity to help sugar and weight as well, though likely impacted by worsening mood and stress   Current Medication: Outpatient Encounter Medications as of 05/01/2022  Medication Sig   amLODipine (NORVASC) 10 MG tablet Take 1 tablet (10 mg total) by mouth daily.   buPROPion (WELLBUTRIN XL) 150 MG 24 hr tablet TAKE 1 TABLET BY MOUTH EVERY DAY   busPIRone (BUSPAR) 5 MG tablet TAKE 1 TABLET BY MOUTH TWICE A DAY   citalopram (CELEXA) 40 MG tablet TAKE 1 TABLET BY MOUTH EVERY DAY   furosemide (LASIX) 20 MG tablet TAKE 1 TABLET BY MOUTH EVERY DAY AS NEEDED FOR LEG SWELLING   gabapentin (NEURONTIN) 300 MG capsule Take 1 capsule (300 mg total) by mouth 3 (three) times daily.   HYDROcodone-acetaminophen (NORCO/VICODIN) 5-325 MG tablet Take 1 tablet by mouth every 6 (six) hours as needed for moderate pain. (Patient not taking:  Reported on 05/05/2022)   lisinopril (ZESTRIL) 20 MG tablet Take 1 tablet (20 mg total) by mouth daily.   lisinopril-hydrochlorothiazide (ZESTORETIC) 20-25 MG tablet TAKE 1 TABLET BY MOUTH EVERY DAY (Patient not taking: Reported on 05/05/2022)   Multiple Vitamins-Minerals (MULTIVITAMIN ADULTS PO) Take 1 tablet by mouth daily.   NEEDLE, DISP, 18 G (BD DISP NEEDLES) 18G X 1-1/2" MISC 1 mg by Does not apply route every 14 (fourteen) days. (Patient not taking: Reported on 05/05/2022)   NEEDLE, DISP, 21 G (BD DISP NEEDLES) 21G X 1-1/2" MISC 1 mg by Does not apply route every 14 (fourteen) days. (Patient not taking: Reported on 05/05/2022)   nitroGLYCERIN (NITROSTAT) 0.4 MG SL tablet Place 1 tablet (0.4 mg total) under the tongue every 5 (five) minutes x 3 doses as needed for chest pain.   omeprazole (PRILOSEC) 40 MG capsule TAKE 1 CAPSULE (40 MG TOTAL) BY MOUTH DAILY.   sildenafil (VIAGRA) 50 MG tablet Take 1-2 tablets (50-100 mg total) by mouth daily as needed for erectile dysfunction (take at least 30 minutes prior to sexual activity on an empty stomach).   Syringe, Disposable, (2-3CC SYRINGE) 3 ML MISC 1 mg by Does not apply route every 14 (fourteen) days. (Patient not taking: Reported on 05/05/2022)   Testosterone 20.25 MG/ACT (1.62%) GEL Place 40.5 mg onto the skin daily. This would be two pumps daily   testosterone cypionate (DEPOTESTOSTERONE CYPIONATE) 200 MG/ML injection Inject 200 mg into the  muscle every 28 (twenty-eight) days. (Patient not taking: Reported on 05/05/2022)   [DISCONTINUED] Dulaglutide (TRULICITY) 0.75 MG/0.5ML SOPN Inject 0.75 mg into the skin once a week.   [DISCONTINUED] Dulaglutide (TRULICITY) 1.5 MG/0.5ML SOPN Inject 1.5 mg into the skin once a week.   No facility-administered encounter medications on file as of 05/01/2022.    Surgical History: Past Surgical History:  Procedure Laterality Date   BONE EXCISION Right 11/21/2020   Procedure: PART EXCISION BONE- PHALANX;  Surgeon:  Gwyneth Revels, DPM;  Location: [facility];  Service: Podiatry;  Laterality: Right;   CARDIAC CATHETERIZATION N/A 09/2011   [facility]; EF 50% with 30% mid LAD stenosis and no obstructive disease.   CARDIAC CATHETERIZATION  09/2010   [facility]; Mid LAD 40% stenosis; Mid Circumflex:Normal; Mid RCA; Normal   CARDIAC CATHETERIZATION     COLONOSCOPY     COLONOSCOPY WITH PROPOFOL N/A 04/10/2021   Procedure: COLONOSCOPY WITH PROPOFOL;  Surgeon: Wyline Mood, MD;  Location: [facility];  Service: Gastroenterology;  Laterality: N/A;   ESOPHAGOGASTRODUODENOSCOPY N/A 04/10/2021   Procedure: ESOPHAGOGASTRODUODENOSCOPY (EGD);  Surgeon: Wyline Mood, MD;  Location: [facility] END[facility];  Service: Gastroenterology;  Laterality: N/A;   ESOPHAGOGASTRODUODENOSCOPY (EGD) WITH PROPOFOL N/A 06/10/2017   Procedure: ESOPHAGOGASTRODUODENOSCOPY (EGD) WITH PROPOFOL;  Surgeon: Wyline Mood, MD;  Location: [facility] END[facility]COPY;  Service: Gastroenterology;  Laterality: N/A;   ESOPHAGOGASTRODUODENOSCOPY (EGD) WITH PROPOFOL N/A 01/01/2022   Procedure: ESOPHAGOGASTRODUODENOSCOPY (EGD) WITH PROPOFOL;  Surgeon: Wyline Mood, MD;  Location: [facility] ENDOSCOPY;  Service: Gastroenterology;  Laterality: N/A;   ESOPHAGOGASTRODUODENOSCOPY (EGD) WITH PROPOFOL N/A 01/06/2022   Procedure: ESOPHAGOGASTRODUODENOSCOPY (EGD) WITH PROPOFOL;  Surgeon: Wyline Mood, MD;  Location: [facility];  Service: Gastroenterology;  Laterality: N/A;   HAMMER TOE SURGERY Right 11/21/2020   Procedure: HAMMERTOE CORRECTION;  Surgeon: Gwyneth Revels, DPM;  Location: [facility];  Service: Podiatry;  Laterality: Right;   INSERTION OF MESH  04/16/2021   Procedure: INSERTION OF MESH;  Surgeon: Henrene Dodge, MD;  Location: [facility] ORS;  Service: General;;   KNEE ARTHROSCOPY WITH MEDIAL MENISECTOMY Right 09/16/2012   Procedure: RIGHT KNEE ARTHROSCOPY WITH MEDIAL AND LATERAL MENISECTOMY, CHONDROPLASTY;  Surgeon: Loreta Ave, MD;  Location: [facility];  Service:  Orthopedics;  Laterality: Right;  RIGHT KNEE SCOPE MEDIAL MENISCECTOMY   LEFT HEART CATH AND CORONARY ANGIOGRAPHY N/A 06/11/2016   Procedure: Left Heart Cath and Coronary Angiography;  Surgeon: Antonieta Iba, MD;  Location: [facility] INVASIVE CV LAB;  Service: Cardiovascular;  Laterality: N/A;   UMBILICAL HERNIA REPAIR N/A 04/16/2021   Procedure: HERNIA REPAIR UMBILICAL ADULT, open;  Surgeon: Henrene Dodge, MD;  Location: [facility] ORS;  Service: General;  Laterality: N/A;    Medical History: Past Medical History:  Diagnosis Date   Abnormal LFTs    Anxiety    Atypical chest pain    Borderline diabetes    CAD (coronary artery disease)    a.) PCI and stent placement (unknown type) to mLAD and mLCx; date unknown. b.) LHC 09/10/2010: 40% mLAD; no intervention. c.) LHC 09/12/2011: EF 60%; 30% ISR mLAD, 40% dLAD, 30% pLCx, 30% ISR mLCx, 40% mRCA; no intervention. d.) LHC 05/23/2014: EF >55%; 30% p-mLAD; no intervention. e.) LHC 06/11/2016: EF 55-65%; LVEDP norm; 30-40% p-mLAD; no intervention.   Carcinoma    Chronic pain syndrome    Depression    Fundic gland polyps of stomach, benign    Gastritis    GERD (gastroesophageal reflux disease)    Hepatic steatosis    Hyperlipidemia  Hypertension    Migraines    Myocardial infarction 2008   was told by PCP   Nonischemic cardiomyopathy    a.) TTE 09/12/2011: EF 35-45%. b.) LHC 09/12/2011: EF 60%. c.) TTE 06/04/2012: EF 55-60%. d.) LHC 05/23/2014: EF >55%. e.) TTE 06/11/2016: EF 55-60%; G1DD. f.) LHC 06/11/2016: EF 55-65%. g.) TTE 01/08/2018: EF 60-65%.   Obesity    Occlusion and stenosis of bilateral carotid arteries    OSA on CPAP    Osteoarthritis of both knees    Shortness of breath    Squamous cell carcinoma of skin 02/27/2020   left distal lat deltoid - [facility]    Family History: Family History  Problem Relation Age of Onset   Heart disease Father 24       MI   Heart attack Father    Stomach cancer Father    Hypertension Mother    Breast  cancer Mother     Social History   Socioeconomic History   Marital status: Married    Spouse name: Not on file   Number of children: 3   Years of education: Not on file   Highest education level: Not on file  Occupational History   Occupation: Camera operator  Tobacco Use   Smoking status: Never    Passive exposure: Past   Smokeless tobacco: Former    Types: Chew    Quit date: 1987  Vaping Use   Vaping Use: Never used  Substance and Sexual Activity   Alcohol use: Not Currently   Drug use: No   Sexual activity: Yes  Other Topics Concern   Not on file  Social History Narrative   Married.  58 yo son.   Wife on disability for bipolar disorder.     Social Determinants of Health   Financial Resource Strain: Not on file  Food Insecurity: Not on file  Transportation Needs: Not on file  Physical Activity: Not on file  Stress: Not on file  Social Connections: Not on file  Intimate Partner Violence: Not on file      Review of Systems  Constitutional:  Positive for fatigue. Negative for chills and unexpected weight change.  HENT:  Negative for congestion, postnasal drip, rhinorrhea, sneezing and sore throat.   Eyes:  Negative for redness.  Respiratory:  Negative for cough, chest tightness and shortness of breath.   Cardiovascular:  Negative for chest pain and palpitations.  Gastrointestinal:  Negative for abdominal pain, anal bleeding, constipation, diarrhea, nausea and vomiting.  Genitourinary:  Negative for dysuria and frequency.  Musculoskeletal:  Positive for arthralgias. Negative for back pain, joint swelling and neck pain.  Skin:  Negative for rash.  Neurological:  Positive for numbness. Negative for tremors.  Hematological:  Negative for adenopathy. Does not bruise/bleed easily.  Psychiatric/Behavioral:  Positive for behavioral problems (Depression) and sleep disturbance. Negative for self-injury and suicidal ideas. The patient is nervous/anxious.     Vital  Signs: BP (!) 154/80 Comment: 162/78  Pulse 77   Temp 98.5 F (36.9 C)   Resp 16   Ht 6\' 2"  (1.88 m)   Wt (!) 330 lb 3.2 oz (149.8 kg)   SpO2 97%   BMI 42.40 kg/m    Physical Exam Vitals and nursing note reviewed.  Constitutional:      General: He is not in acute distress.    Appearance: Normal appearance. He is well-developed. He is obese. He is not diaphoretic.  HENT:     Head: Normocephalic and atraumatic.  Mouth/Throat:     Pharynx: No oropharyngeal exudate.  Eyes:     Pupils: Pupils are equal, round, and reactive to light.  Neck:     Thyroid: No thyromegaly.     Vascular: No JVD.     Trachea: No tracheal deviation.  Cardiovascular:     Rate and Rhythm: Normal rate and regular rhythm.     Heart sounds: Normal heart sounds. No murmur heard.    No friction rub. No gallop.  Pulmonary:     Effort: Pulmonary effort is normal. No respiratory distress.     Breath sounds: No wheezing or rales.  Chest:     Chest wall: No tenderness.  Abdominal:     General: Bowel sounds are normal.     Palpations: Abdomen is soft.  Musculoskeletal:        General: Normal range of motion.     Cervical back: Normal range of motion and neck supple.     Right lower leg: No edema.     Left lower leg: No edema.  Lymphadenopathy:     Cervical: No cervical adenopathy.  Skin:    General: Skin is warm and dry.  Neurological:     Mental Status: He is alert and oriented to person, place, and time.     Cranial Nerves: No cranial nerve deficit.  Psychiatric:        Thought Content: Thought content normal.        Judgment: Judgment normal.     Comments: Depressed affect         Assessment/Plan: 1. Essential hypertension Will add extra 20mg  lisinopril to help improve BP, has recheck with cardiology soon to see response and will also monitor at home - lisinopril (ZESTRIL) 20 MG tablet; Take 1 tablet (20 mg total) by mouth daily.  Dispense: 90 tablet; Refill: 3  2. Generalized anxiety  disorder May increase buspar to 10mg . Will refer to psych for worsening symptoms despite multiple medications - Ambulatory referral to Psychiatry  3. Moderate episode of recurrent major depressive disorder (HCC) May increase buspar to 10mg . Will refer to psych for worsening symptoms despite multiple medications - Ambulatory referral to Psychiatry  4. Type 2 diabetes mellitus with hyperglycemia, unspecified whether long term insulin use (HCC) Will increase trulicity  5. Morbid obesity with BMI of 40.0-44.9, adult (HCC) Will work on diet and exercise and will increase trulicity   General Counseling: Marcy SalvoRaymond verbalizes understanding of the findings of todays visit and agrees with plan of treatment. I have discussed any further diagnostic evaluation that may be needed or ordered today. We also reviewed his medications today. he has been encouraged to call the office with any questions or concerns that should arise related to todays visit.    Orders Placed This Encounter  Procedures   Ambulatory referral to Psychiatry    Meds ordered this encounter  Medications   DISCONTD: Dulaglutide (TRULICITY) 1.5 MG/0.5ML SOPN    Sig: Inject 1.5 mg into the skin once a week.    Dispense:  2 mL    Refill:  0   lisinopril (ZESTRIL) 20 MG tablet    Sig: Take 1 tablet (20 mg total) by mouth daily.    Dispense:  90 tablet    Refill:  3    This patient was seen by Lynn ItoLauren Darlyn Repsher, PA-C in collaboration with Dr. Beverely RisenFozia Khan as a part of collaborative care agreement.   Total time spent:30 Minutes Time spent includes review of chart, medications, test results, and  follow up plan with the patient.      Dr Lavera Guise Internal medicine

## 2022-05-03 ENCOUNTER — Other Ambulatory Visit: Payer: Self-pay | Admitting: Physician Assistant

## 2022-05-03 DIAGNOSIS — E1165 Type 2 diabetes mellitus with hyperglycemia: Secondary | ICD-10-CM

## 2022-05-05 ENCOUNTER — Other Ambulatory Visit: Payer: Self-pay

## 2022-05-05 ENCOUNTER — Other Ambulatory Visit: Payer: Self-pay | Admitting: Cardiology

## 2022-05-05 ENCOUNTER — Ambulatory Visit (INDEPENDENT_AMBULATORY_CARE_PROVIDER_SITE_OTHER): Payer: 59 | Admitting: Internal Medicine

## 2022-05-05 ENCOUNTER — Encounter: Payer: Self-pay | Admitting: Cardiology

## 2022-05-05 ENCOUNTER — Ambulatory Visit: Payer: 59 | Attending: Cardiology | Admitting: Cardiology

## 2022-05-05 VITALS — BP 155/78 | HR 77 | Resp 16 | Ht 74.0 in | Wt 324.8 lb

## 2022-05-05 VITALS — BP 132/68 | HR 74 | Ht 74.0 in | Wt 323.4 lb

## 2022-05-05 DIAGNOSIS — R072 Precordial pain: Secondary | ICD-10-CM | POA: Diagnosis not present

## 2022-05-05 DIAGNOSIS — R600 Localized edema: Secondary | ICD-10-CM

## 2022-05-05 DIAGNOSIS — E1165 Type 2 diabetes mellitus with hyperglycemia: Secondary | ICD-10-CM

## 2022-05-05 DIAGNOSIS — Z7189 Other specified counseling: Secondary | ICD-10-CM | POA: Diagnosis not present

## 2022-05-05 DIAGNOSIS — G4733 Obstructive sleep apnea (adult) (pediatric): Secondary | ICD-10-CM

## 2022-05-05 DIAGNOSIS — I2510 Atherosclerotic heart disease of native coronary artery without angina pectoris: Secondary | ICD-10-CM

## 2022-05-05 DIAGNOSIS — E785 Hyperlipidemia, unspecified: Secondary | ICD-10-CM | POA: Diagnosis not present

## 2022-05-05 DIAGNOSIS — I10 Essential (primary) hypertension: Secondary | ICD-10-CM

## 2022-05-05 DIAGNOSIS — Z6841 Body Mass Index (BMI) 40.0 and over, adult: Secondary | ICD-10-CM

## 2022-05-05 DIAGNOSIS — R0602 Shortness of breath: Secondary | ICD-10-CM

## 2022-05-05 MED ORDER — NEXLIZET 180-10 MG PO TABS: 1.0000 | ORAL_TABLET | Freq: Every day | ORAL | 11 refills | Status: DC

## 2022-05-05 MED ORDER — TRULICITY 1.5 MG/0.5ML ~~LOC~~ SOAJ
1.5000 mg | SUBCUTANEOUS | 0 refills | Status: DC
Start: 2022-05-05 — End: 2022-07-02

## 2022-05-05 MED ORDER — ISOSORBIDE MONONITRATE ER 30 MG PO TB24: 15.0000 mg | ORAL_TABLET | Freq: Every day | ORAL | 3 refills | Status: DC

## 2022-05-05 NOTE — Patient Instructions (Signed)

## 2022-05-05 NOTE — Patient Instructions (Signed)
Medication Instructions:  Your physician has recommended you make the following change in your medication:   START Isosorbide mononitrate 15 mg once daily START Nexlizet once daily  *If you need a refill on your cardiac medications before your next appointment, please call your pharmacy*   Lab Work: None  If you have labs (blood work) drawn today and your tests are completely normal, you will receive your results only by: [place] (if you have MyChart) OR A paper copy in the mail If you have any lab test that is abnormal or we need to change your treatment, we will call you to review the results.   Testing/Procedures: [place]  Your caregiver has ordered a Stress Test with nuclear imaging. The purpose of this test is to evaluate the blood supply to your heart muscle. This procedure is referred to as a "Non-Invasive Stress Test." This is because other than having an IV started in your vein, nothing is inserted or "invades" your body. Cardiac stress tests are done to find areas of poor blood flow to the heart by determining the extent of coronary artery disease (CAD). Some patients exercise on a treadmill, which naturally increases the blood flow to your heart, while others who are  unable to walk on a treadmill due to physical limitations have a pharmacologic/chemical stress agent called Lexiscan . This medicine will mimic walking on a treadmill by temporarily increasing your coronary blood flow.   Please note: these test may take anywhere between 2-4 hours to complete  PLEASE REPORT TO [facility] AT THE FIRST DESK WILL DIRECT YOU WHERE TO GO  Date of Procedure:______________________  Arrival Time for Procedure:____________________________  Instructions regarding medication:   _XX___ : Hold diabetes medication morning of procedure  _XX___:  Hold other medications as follows: lasix (Furosemide) the morning of your procedure.    PLEASE  NOTIFY THE OFFICE AT LEAST 64 HOURS IN ADVANCE IF YOU ARE UNABLE TO KEEP YOUR APPOINTMENT.  [phone] AND  PLEASE NOTIFY NUCLEAR MEDICINE AT [facility] AT LEAST 24 HOURS IN ADVANCE IF YOU ARE UNABLE TO KEEP YOUR APPOINTMENT. [phone]  How to prepare for your Myoview test:  Do not eat or drink after midnight No caffeine for 24 hours prior to test No smoking 24 hours prior to test. Your medication may be taken with water.  If your doctor stopped a medication because of this test, do not take that medication. Ladies, please do not wear dresses.  Skirts or pants are appropriate. Please wear a short sleeve shirt. No perfume, cologne or lotion. Wear comfortable walking shoes. No heels!   Follow-Up: At [facility], you and your health needs are our priority.  As part of our continuing mission to provide you with exceptional heart care, we have created designated Provider Care Teams.  These Care Teams include your primary Cardiologist (physician) and Advanced Practice Providers (APPs -  Physician Assistants and Nurse Practitioners) who all work together to provide you with the care you need, when you need it.   Your next appointment:   Follow up after lexiscan has been done.   Provider:   Kate Sable, MD or Gerrie Nordmann, NP

## 2022-05-05 NOTE — Progress Notes (Unsigned)
[facility] [place], [place] [address]  Pulmonary Sleep Medicine   Office Visit Note  Patient Name: Gerald [name] DOB: May 28, 1965 MRN [id number]    Chief Complaint: Obstructive Sleep Apnea visit  Brief History:  Gerald [name] is seen today for an initial consult for APAP@ 4-20 cmH2O. The patient has a 8 year history of sleep apnea. Patient is not using PAP consistently nightly.  The patient feels rested after sleeping with PAP.  The patient reports benefit from PAP use. Reported sleepiness is  improved and the Epworth Sleepiness Score is 9 out of 24. The patient does take naps two to three times a week for an hour. The patient complains of the following: no complaints other than some oral dryness. Advised mouth rinse and tablets.  The compliance download shows 67% compliance with an average use time of 5 hours 46 minutes. The AHI is 3.2.  The patient does not complain of limb movements disrupting sleep. The patient continues to require PAP therapy in order to eliminate sleep apnea. Patient works 4 -12 hrs shifts at night a week. He attributes lower compliance to this schedule but will work at bringing up his compliance with naps and trying to get in a little more sleep.  ROS  General: (-) fever, (-) chills, (-) night sweat Nose and Sinuses: (-) nasal stuffiness or itchiness, (-) postnasal drip, (-) nosebleeds, (-) sinus trouble. Mouth and Throat: (-) sore throat, (-) hoarseness. Neck: (-) swollen glands, (-) enlarged thyroid, (-) neck pain. Respiratory: - cough, - shortness of breath, - wheezing. Neurologic: + numbness, + tingling. Psychiatric: - anxiety, + depression   Current Medication: Outpatient Encounter Medications as of 05/05/2022  Medication Sig   amLODipine (NORVASC) 10 MG tablet Take 1 tablet (10 mg total) by mouth daily.   Bempedoic Acid-Ezetimibe (NEXLIZET) 180-10 MG TABS Take 1 tablet by mouth daily.   buPROPion (WELLBUTRIN XL) 150 MG 24 hr  tablet TAKE 1 TABLET BY MOUTH EVERY DAY   busPIRone (BUSPAR) 5 MG tablet TAKE 1 TABLET BY MOUTH TWICE A DAY   citalopram (CELEXA) 40 MG tablet TAKE 1 TABLET BY MOUTH EVERY DAY   Dulaglutide (TRULICITY) 1.5 0000000 SOPN Inject 1.5 mg into the skin once a week.   furosemide (LASIX) 20 MG tablet TAKE 1 TABLET BY MOUTH EVERY DAY AS NEEDED FOR LEG SWELLING   gabapentin (NEURONTIN) 300 MG capsule Take 1 capsule (300 mg total) by mouth 3 (three) times daily.   HYDROcodone-acetaminophen (NORCO/VICODIN) 5-325 MG tablet Take 1 tablet by mouth every 6 (six) hours as needed for moderate pain. (Patient not taking: Reported on 05/05/2022)   isosorbide mononitrate (IMDUR) 30 MG 24 hr tablet Take 0.5 tablets (15 mg total) by mouth daily.   lisinopril (ZESTRIL) 20 MG tablet Take 1 tablet (20 mg total) by mouth daily.   lisinopril-hydrochlorothiazide (ZESTORETIC) 20-25 MG tablet TAKE 1 TABLET BY MOUTH EVERY DAY (Patient not taking: Reported on 05/05/2022)   Multiple Vitamins-Minerals (MULTIVITAMIN ADULTS PO) Take 1 tablet by mouth daily.   NEEDLE, DISP, 18 G (BD DISP NEEDLES) 18G X 1-1/2" MISC 1 mg by Does not apply route every 14 (fourteen) days. (Patient not taking: Reported on 05/05/2022)   NEEDLE, DISP, 21 G (BD DISP NEEDLES) 21G X 1-1/2" MISC 1 mg by Does not apply route every 14 (fourteen) days. (Patient not taking: Reported on 05/05/2022)   nitroGLYCERIN (NITROSTAT) 0.4 MG SL tablet Place 1 tablet (0.4 mg total) under the tongue every 5 (five) minutes  x 3 doses as needed for chest pain.   omeprazole (PRILOSEC) 40 MG capsule TAKE 1 CAPSULE (40 MG TOTAL) BY MOUTH DAILY.   sildenafil (VIAGRA) 50 MG tablet Take 1-2 tablets (50-100 mg total) by mouth daily as needed for erectile dysfunction (take at least 30 minutes prior to sexual activity on an empty stomach).   Syringe, Disposable, (2-3CC SYRINGE) 3 ML MISC 1 mg by Does not apply route every 14 (fourteen) days. (Patient not taking: Reported on 05/05/2022)   Testosterone  20.25 MG/ACT (1.62%) GEL Place 40.5 mg onto the skin daily. This would be two pumps daily   testosterone cypionate (DEPOTESTOSTERONE CYPIONATE) 200 MG/ML injection Inject 200 mg into the muscle every 28 (twenty-eight) days. (Patient not taking: Reported on 05/05/2022)   No facility-administered encounter medications on file as of 05/05/2022.    Surgical History: Past Surgical History:  Procedure Laterality Date   BONE EXCISION Right 11/21/2020   Procedure: PART EXCISION BONE- PHALANX;  Surgeon: Samara Deist, DPM;  Location: [place];  Service: Podiatry;  Laterality: Right;   CARDIAC CATHETERIZATION N/A 09/2011   [facility]; EF 50% with 30% mid LAD stenosis and no obstructive disease.   CARDIAC CATHETERIZATION  09/2010   [facility]; Mid LAD 40% stenosis; Mid Circumflex:Normal; Mid RCA; Normal   CARDIAC CATHETERIZATION     COLONOSCOPY     COLONOSCOPY WITH PROPOFOL N/A 04/10/2021   Procedure: COLONOSCOPY WITH PROPOFOL;  Surgeon: Jonathon Bellows, MD;  Location: [facility] ENDOSCOPY;  Service: Gastroenterology;  Laterality: N/A;   ESOPHAGOGASTRODUODENOSCOPY N/A 04/10/2021   Procedure: ESOPHAGOGASTRODUODENOSCOPY (EGD);  Surgeon: Jonathon Bellows, MD;  Location: [facility] ENDOSCOPY;  Service: Gastroenterology;  Laterality: N/A;   ESOPHAGOGASTRODUODENOSCOPY (EGD) WITH PROPOFOL N/A 06/10/2017   Procedure: ESOPHAGOGASTRODUODENOSCOPY (EGD) WITH PROPOFOL;  Surgeon: Jonathon Bellows, MD;  Location: [facility] ENDOSCOPY;  Service: Gastroenterology;  Laterality: N/A;   ESOPHAGOGASTRODUODENOSCOPY (EGD) WITH PROPOFOL N/A 01/01/2022   Procedure: ESOPHAGOGASTRODUODENOSCOPY (EGD) WITH PROPOFOL;  Surgeon: Jonathon Bellows, MD;  Location: [facility] END[facility]COPY;  Service: Gastroenterology;  Laterality: N/A;   ESOPHAGOGASTRODUODENOSCOPY (EGD) WITH PROPOFOL N/A 01/06/2022   Procedure: ESOPHAGOGASTRODUODENOSCOPY (EGD) WITH PROPOFOL;  Surgeon: Jonathon Bellows, MD;  Location: [facility] ENDOSCOPY;  Service: Gastroenterology;  Laterality: N/A;   HAMMER TOE SURGERY Right 11/21/2020    Procedure: HAMMERTOE CORRECTION;  Surgeon: Samara Deist, DPM;  Location: [place];  Service: Podiatry;  Laterality: Right;   INSERTION OF MESH  04/16/2021   Procedure: INSERTION OF MESH;  Surgeon: Olean Ree, MD;  Location: [facility] ORS;  Service: General;;   KNEE ARTHROSCOPY WITH MEDIAL MENISECTOMY Right 09/16/2012   Procedure: RIGHT KNEE ARTHROSCOPY WITH MEDIAL AND LATERAL MENISECTOMY, CHONDROPLASTY;  Surgeon: Ninetta Lights, MD;  Location: [place];  Service: Orthopedics;  Laterality: Right;  RIGHT KNEE SCOPE MEDIAL MENISCECTOMY   LEFT HEART CATH AND CORONARY ANGIOGRAPHY N/A 06/11/2016   Procedure: Left Heart Cath and Coronary Angiography;  Surgeon: Minna Merritts, MD;  Location: [place] [facility];  Service: Cardiovascular;  Laterality: N/A;   UMBILICAL HERNIA REPAIR N/A 04/16/2021   Procedure: HERNIA REPAIR UMBILICAL ADULT, open;  Surgeon: Olean Ree, MD;  Location: [facility] ORS;  Service: General;  Laterality: N/A;    Medical History: Past Medical History:  Diagnosis Date   Abnormal LFTs    Anxiety    Atypical chest pain    Borderline diabetes    CAD (coronary artery disease)    a.) PCI and stent placement (unknown type) to mLAD and mLCx; date unknown. b.) LHC 09/10/2010: 40% mLAD; no intervention.  c.) LHC 09/12/2011: EF 60%; 30% ISR mLAD, 40% dLAD, 30% pLCx, 30% ISR mLCx, 40% mRCA; no intervention. d.) LHC 05/23/2014: EF >55%; 30% p-mLAD; no intervention. e.) LHC 06/11/2016: EF 55-65%; LVEDP norm; 30-40% p-mLAD; no intervention.   Carcinoma    Chronic pain syndrome    Depression    Fundic gland polyps of stomach, benign    Gastritis    GERD (gastroesophageal reflux disease)    Hepatic steatosis    Hyperlipidemia    Hypertension    Migraines    Myocardial infarction 2008   was told by PCP   Nonischemic cardiomyopathy    a.) TTE 09/12/2011: EF 35-45%. b.) LHC 09/12/2011: EF 60%. c.) TTE 06/04/2012: EF 55-60%. d.) LHC 05/23/2014: EF >55%. e.) TTE  06/11/2016: EF 55-60%; G1DD. f.) LHC 06/11/2016: EF 55-65%. g.) TTE 01/08/2018: EF 60-65%.   Obesity    Occlusion and stenosis of bilateral carotid arteries    OSA on CPAP    Osteoarthritis of both knees    Shortness of breath    Squamous cell carcinoma of skin 02/27/2020   left distal lat deltoid - [facility]    Family History: Non contributory to the present illness  Social History: Social History   Socioeconomic History   Marital status: Married    Spouse name: Not on file   Number of children: 3   Years of education: Not on file   Highest education level: Not on file  Occupational History   Occupation: Librarian, academic  Tobacco Use   Smoking status: Never    Passive exposure: Past   Smokeless tobacco: Former    Types: Chew    Quit date: 1987  Vaping Use   Vaping Use: Never used  Substance and Sexual Activity   Alcohol use: Not Currently   Drug use: No   Sexual activity: Yes  Other Topics Concern   Not on file  Social History Narrative   Married.  61 yo son.   Wife on disability for bipolar disorder.     Social Determinants of Health   Financial Resource Strain: Not on file  Food Insecurity: Not on file  Transportation Needs: Not on file  Physical Activity: Not on file  Stress: Not on file  Social Connections: Not on file  Intimate Partner Violence: Not on file    Vital Signs: Blood pressure (!) 155/78, pulse 77, resp. rate 16, height 6\' 2"  (1.88 m), weight (!) 324 lb 12.8 oz (147.3 kg), SpO2 97 %. Body mass index is 41.7 kg/m.    Examination: General Appearance: The patient is well-developed, well-nourished, and in no distress. Neck Circumference: 49cm Skin: Gross inspection of skin unremarkable. Head: normocephalic, no gross deformities. Eyes: no gross deformities noted. ENT: ears appear grossly normal Neurologic: Alert and oriented. No involuntary movements.  STOP BANG RISK ASSESSMENT S (snore) Have you been told that you snore?     No   T  (tired) Are you often tired, fatigued, or sleepy during the day?   NO  O (obstruction) Do you stop breathing, choke, or gasp during sleep? NO   P (pressure) Do you have or are you being treated for high blood pressure? YES   B (BMI) Is your body index greater than 35 kg/m? YES  A (age) Are you 83 years old or older? YES  N (neck) Do you have a neck circumference greater than 16 inches?   YES   G (gender) Are you a male? YES   TOTAL STOP/BANG "YES"  ANSWERS 5       A STOP-Bang score of 2 or less is considered low risk, and a score of 5 or more is high risk for having either moderate or severe OSA. For people who score 3 or 4, doctors may need to perform further assessment to determine how likely they are to have OSA.         EPWORTH SLEEPINESS SCALE:  Scale:  (0)= no chance of dozing; (1)= slight chance of dozing; (2)= moderate chance of dozing; (3)= high chance of dozing  Chance  Situtation    Sitting and reading: 2    Watching TV: 3    Sitting Inactive in public: 0    As a passenger in car: 0      Lying down to rest: 2    Sitting and talking: 0    Sitting quielty after lunch: 2    In a car, stopped in traffic: 0   TOTAL SCORE:   9 out of 24    SLEEP STUDIES:  Split Study (06/2014 at [facility]) AHI 97.7/hr, min SpO2 73%, CPAP@ 9 cmH2O HST (10/2021 at [facility]) AHI 19.8/hr, Supine AHI 54.8/hr, min SpO2 71%   CPAP COMPLIANCE DATA:  Date Range: 04/05/2022-05/04/2022   Average Daily Use: 5 hours 46 minutes  Median Use: 5 hours 18 minutes  Compliance for > 4 Hours: 67%  AHI: 3.2 respiratory events per hour  Days Used: 26/30 days  Mask Leak: 16  95th Percentile Pressure: 11.8         LABS: Recent Results (from the past 2160 hour(s))  Testosterone     Status: Abnormal   Collection Time: 02/17/22  1:00 PM  Result Value Ref Range   Testosterone 75 (L) 264 - 916 ng/dL    Comment: Adult male reference interval is based on a  population of healthy nonobese males (BMI <30) between 31 and 26 years old. [place], [place] [phone]. PMID: [id number].   ECHOCARDIOGRAM COMPLETE     Status: None   Collection Time: 04/09/22  7:54 AM  Result Value Ref Range   AR max vel 3.35 cm2   AV Peak grad 4.1 mmHg   Ao pk vel 1.01 m/s   S' Lateral 3.70 cm   Area-P 1/2 3.37 cm2   AV Area VTI 3.36 cm2   AV Mean grad 2.0 mmHg   Single Plane A4C EF 47.3 %   Single Plane A2C EF 48.9 %   Calc EF 48.4 %   AV Area mean vel 3.33 cm2   Est EF 50 - 55%   Testosterone     Status: Abnormal   Collection Time: 04/14/22  2:30 PM  Result Value Ref Range   Testosterone 81 (L) 264 - 916 ng/dL    Comment: Adult male reference interval is based on a population of healthy nonobese males (BMI <30) between 58 and 89 years old. [place], [place] [phone]. PMID: [id number].   PSA, total and free     Status: None   Collection Time: 04/14/22  2:30 PM  Result Value Ref Range   Prostate Specific Ag, Serum 1.4 0.0 - 4.0 ng/mL    Comment: Roche ECLIA methodology. According to the American Urological Association, Serum PSA should decrease and remain at undetectable levels after radical prostatectomy. The AUA defines biochemical recurrence as an initial PSA value 0.2 ng/mL or greater followed by a subsequent confirmatory PSA value 0.2 ng/mL or greater. Values obtained with different assay methods or kits  cannot be used interchangeably. Results cannot be interpreted as absolute evidence of the presence or absence of malignant disease.    PSA, Free 0.28 N/A ng/mL    Comment: Roche ECLIA methodology.   PSA, Free Pct 20.0 %    Comment: The table below lists the probability of prostate cancer for men with non-suspicious DRE results and total PSA between 4 and 10 ng/mL, by patient age Ricci Barker, [place], [id number]).                   % Free PSA       50-64 yr        65-75 yr                   0.00-10.00%        56%              55%                  10.01-15.00%        24%             35%                  15.01-20.00%        17%             23%                  20.01-25.00%        10%             20%                       >25.00%         5%              9% Please note:  Catalona et al did not make specific               recommendations regarding the use of               percent free PSA for any other population               of men.   Hemoglobin and Hematocrit, Blood     Status: None   Collection Time: 04/14/22  2:30 PM  Result Value Ref Range   Hemoglobin 14.8 13.0 - 17.7 g/dL   Hematocrit 44.1 37.5 - 51.0 %    Radiology: No results found.  No results found.  ECHOCARDIOGRAM COMPLETE  Result Date: 04/09/2022    ECHOCARDIOGRAM REPORT   Patient Name:   KAMRYNN LUTY Date of Exam: 04/09/2022 Medical Rec #:  [id number]         Height:       74.0 in Accession #:    [id number]        Weight:       328.2 lb Date of Birth:  August 04, 1965         BSA:          2.683 m Patient Age:    66 years          BP:           140/82 mmHg Patient Gender: M                 HR:           65 bpm. Exam Location:   Procedure: 2D Echo, Cardiac  Doppler and Color Doppler Indications:    I42.80 Non-ischemic cardiomyopathy  History:        Patient has prior history of Echocardiogram examinations, most                 recent 04/25/2021. CHF and Cardiomyopathy, CAD,                 Signs/Symptoms:Chest Pain; Risk Factors:Hypertension,                 Dyslipidemia, Sleep Apnea, Diabetes and Non-Smoker.  Sonographer:    Pilar Jarvis RDMS, RVT, RDCS Referring Phys: [id number] Gerald [name]  Sonographer Comments: Global longitudinal strain was attempted. IMPRESSIONS  1. Left ventricular ejection fraction, by estimation, is 50 to 55%. The left ventricle has low normal function. The left ventricle has no regional wall motion abnormalities. Left ventricular diastolic parameters were normal.  2. Right ventricular systolic function is normal.  The right ventricular size is normal.  3. The mitral valve is normal in structure. No evidence of mitral valve regurgitation.  4. The aortic valve was not well visualized. Aortic valve regurgitation is not visualized.  5. The inferior vena cava is normal in size with greater than 50% respiratory variability, suggesting right atrial pressure of 3 mmHg. FINDINGS  Left Ventricle: Left ventricular ejection fraction, by estimation, is 50 to 55%. The left ventricle has low normal function. The left ventricle has no regional wall motion abnormalities. The left ventricular internal cavity size was normal in size. There is no left ventricular hypertrophy. Left ventricular diastolic parameters were normal. Right Ventricle: The right ventricular size is normal. No increase in right ventricular wall thickness. Right ventricular systolic function is normal. Left Atrium: Left atrial size was normal in size. Right Atrium: Right atrial size was normal in size. Pericardium: There is no evidence of pericardial effusion. Mitral Valve: The mitral valve is normal in structure. No evidence of mitral valve regurgitation. Tricuspid Valve: The tricuspid valve is normal in structure. Tricuspid valve regurgitation is not demonstrated. Aortic Valve: The aortic valve was not well visualized. Aortic valve regurgitation is not visualized. Aortic valve mean gradient measures 2.0 mmHg. Aortic valve peak gradient measures 4.1 mmHg. Aortic valve area, by VTI measures 3.36 cm. Pulmonic Valve: The pulmonic valve was normal in structure. Pulmonic valve regurgitation is not visualized. Aorta: The aortic root and ascending aorta are structurally normal, with no evidence of dilitation. Venous: The inferior vena cava is normal in size with greater than 50% respiratory variability, suggesting right atrial pressure of 3 mmHg. IAS/Shunts: No atrial level shunt detected by color flow Doppler.  LEFT VENTRICLE PLAX 2D LVIDd:         5.30 cm      Diastology  LVIDs:         3.70 cm      LV e' medial:    8.27 cm/s LV PW:         1.20 cm      LV E/e' medial:  11.5 LV IVS:        1.10 cm      LV e' lateral:   10.00 cm/s LVOT diam:     2.20 cm      LV E/e' lateral: 9.5 LV SV:         77 LV SV Index:   29 LVOT Area:     3.80 cm  LV Volumes (MOD) LV vol d, MOD A2C: 141.0 ml LV vol d, MOD A4C: 164.5 ml LV vol  s, MOD A2C: 72.1 ml LV vol s, MOD A4C: 86.7 ml LV SV MOD A2C:     68.9 ml LV SV MOD A4C:     164.5 ml LV SV MOD BP:      74.8 ml RIGHT VENTRICLE             IVC RV S prime:     12.20 cm/s  IVC diam: 1.70 cm TAPSE (M-mode): 2.5 cm LEFT ATRIUM             Index LA diam:        4.60 cm 1.71 cm/m LA Vol (A2C):   67.8 ml 25.27 ml/m LA Vol (A4C):   75.8 ml 28.25 ml/m LA Biplane Vol: 77.1 ml 28.74 ml/m  AORTIC VALVE                    PULMONIC VALVE AV Area (Vmax):    3.35 cm     PV Vmax:       0.85 m/s AV Area (Vmean):   3.33 cm     PV Peak grad:  2.9 mmHg AV Area (VTI):     3.36 cm AV Vmax:           101.00 cm/s AV Vmean:          68.200 cm/s AV VTI:            0.230 m AV Peak Grad:      4.1 mmHg AV Mean Grad:      2.0 mmHg LVOT Vmax:         89.10 cm/s LVOT Vmean:        59.700 cm/s LVOT VTI:          0.203 m LVOT/AV VTI ratio: 0.88  AORTA Ao Root diam: 3.40 cm Ao Asc diam:  3.50 cm Ao Arch diam: 3.0 cm MITRAL VALVE MV Area (PHT): 3.37 cm    SHUNTS MV Decel Time: 225 msec    Systemic VTI:  0.20 m MV E velocity: 95.40 cm/s  Systemic Diam: 2.20 cm MV A velocity: 63.40 cm/s MV E/A ratio:  1.50 Gerald Sable MD Electronically signed by Gerald Sable MD Signature Date/Time: 04/09/2022/5:06:41 PM    Final       Assessment and Plan: Patient Active Problem List   Diagnosis Date Noted   Melena 01/06/2022   Postoperative abdominal pain 04/24/2021   Mesenteric panniculitis 04/24/2021   Orthopnea    Infection of superficial incisional surgical site after procedure    Non-recurrent bilateral inguinal hernia without obstruction or gangrene    Umbilical hernia  without obstruction and without gangrene    Impingement syndrome of right shoulder region 11/07/2019   Chest pain 01/08/2018   Gastroesophageal reflux disease without esophagitis 08/23/2017   Urinary tract infection without hematuria 06/14/2017   Generalized anxiety disorder 02/02/2017   Abnormal results of liver function studies 02/02/2017   Circadian rhythm sleep disorder, shift work type 02/02/2017   Acne vulgaris 02/02/2017   Occlusion and stenosis of bilateral carotid arteries 02/02/2017   Neoplasm of uncertain behavior of skin 02/02/2017   Morbid obesity 07/07/2016   Atypical chest pain 06/08/2016   Mass of jaw 05/22/2014   Retaining fluid 11/08/2013   Urinary incontinence 03/14/2013   Depression 01/13/2013   Type 2 diabetes mellitus 09/14/2012   Nonischemic cardiomyopathy    Mixed hyperlipidemia 05/28/2012   Chronic pain syndrome 03/30/2012   HTN (hypertension) 03/30/2012   1. OSA (obstructive sleep apnea) The patient does  tolerate PAP and reports  benefit from PAP use. The patient was reminded how to clean equipment and advised to replace supplies routinely. The patient was also counselled on weight loss. The compliance is fair. The AHI is 3.2.   OSA on cpap- controlled. Continue with excellent compliance with pap. CPAP continues to be medically necessary to treat this patient's OSA. F/u one year.    2. CPAP use counseling CPAP Counseling: had a lengthy discussion with the patient regarding the importance of PAP therapy in management of the sleep apnea. Patient appears to understand the risk factor reduction and also understands the risks associated with untreated sleep apnea. Patient will try to make a good faith effort to remain compliant with therapy. Also instructed the patient on proper cleaning of the device including the water must be changed daily if possible and use of distilled water is preferred. Patient understands that the machine should be regularly cleaned with  appropriate recommended cleaning solutions that do not damage the PAP machine for example given white vinegar and water rinses. Other methods such as ozone treatment may not be as good as these simple methods to achieve cleaning.   3. Morbid obesity with BMI of 40.0-44.9, adult Obesity Counseling: Had a lengthy discussion regarding patients BMI and weight issues. Patient was instructed on portion control as well as increased activity. Also discussed caloric restrictions with trying to maintain intake less than 2000 Kcal. Discussions were made in accordance with the 5As of weight management. Simple actions such as not eating late and if able to, taking a walk is suggested.   4. Essential hypertension Hypertension Counseling:   The following hypertensive lifestyle modification were recommended and discussed:  1. Limiting alcohol intake to less than 1 oz/day of ethanol:(24 oz of beer or 8 oz of wine or 2 oz of 100-proof whiskey). 2. Take baby ASA 81 mg daily. 3. Importance of regular aerobic exercise and losing weight. 4. Reduce dietary saturated fat and cholesterol intake for overall cardiovascular health. 5. Maintaining adequate dietary potassium, calcium, and magnesium intake. 6. Regular monitoring of the blood pressure. 7. Reduce sodium intake to less than 100 mmol/day (less than 2.3 gm of sodium or less than 6 gm of sodium choride)       General Counseling: I have discussed the findings of the evaluation and examination with Gerald [name].  I have also discussed any further diagnostic evaluation thatmay be needed or ordered today. Gerald [name] verbalizes understanding of the findings of todays visit. We also reviewed his medications today and discussed drug interactions and side effects including but not limited excessive drowsiness and altered mental states. We also discussed that there is always a risk not just to him but also people around him. he has been encouraged to call the office with any questions  or concerns that should arise related to todays visit.  No orders of the defined types were placed in this encounter.       I have personally obtained a history, examined the patient, evaluated laboratory and imaging results, formulated the assessment and plan and placed orders. This patient was seen today by Tressie Ellis, PA-C in collaboration with Dr. Devona Konig.   Allyne Gee, MD [facility] Diplomate ABMS Pulmonary Critical Care Medicine and Sleep Medicine

## 2022-05-05 NOTE — Progress Notes (Signed)
Cardiology Office Note:   Date:  05/05/2022  ID:  Gerald [name], DOB 09-10-1965, MRN [id number]  History of Present Illness:   Gerald [name] is a 57 y.o. male with a history of nonobstructive coronary artery disease, hyperlipidemia, morbid obesity, hypertension, carotid stenosis, type II diabetes, OSA on CPAP, and chronic pain syndrome, who is here today for follow-up of his nonobstructive coronary artery disease and hypertension.  Diagnostic heart cath demonstrated nonobstructive coronary artery disease with 40% in the proximal LAD. Echo in 2019 revealed LVEF 60-65%.  Patient was last seen on 04/04/22 by Dr. Garen Lah. At that time her was re-establishing care. He had no complaints. Blood pressure was noted to be running a little higher. He was started on Nexlizet , amlodipipne was increased, and he was scheduled for an echocardiogram.   He returns to clinic today with complaints of chest discomfort that he describes as a pressure and tightness that comes and goes with rest or exertion and often will radiate down his left arm. He states this has been ongoing for the about the last 2 months. He also has associated shortness of breath during these episodes. He has peripheral edema that is worse as he is standing and usually has decreased when he gets  out of bed to start the day. He was recently seen and underwent an echo which he states was normal, but the new medication he was to start on for his cholesterol he has not heard from the pharmacy about the medication being filled. 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  Denies any hospitalizations or visits to the emergency department.  ROS: Endorses chest pain, shortness of breath, peripheral edema, fatigue, and pain to his bilateral feet, the remainder of the 10 point review of systems is negative  Studies Reviewed:    EKG: No new tracings were completed today  TTE 04/09/22 1. Left  ventricular ejection fraction, by estimation, is 50 to 55%. The  left ventricle has low normal function. The left ventricle has no regional  wall motion abnormalities. Left ventricular diastolic parameters were  normal.   2. Right ventricular systolic function is normal. The right ventricular  size is normal.   3. The mitral valve is normal in structure. No evidence of mitral valve  regurgitation.   4. The aortic valve was not well visualized. Aortic valve regurgitation  is not visualized.   5. The inferior vena cava is normal in size with greater than 50%  respiratory variability, suggesting right atrial pressure of 3 mmHg.   Risk Assessment/Calculations:              Physical Exam:   VS:  BP 132/68 (BP Location: Left Arm, Patient Position: Sitting, Cuff Size: Large)   Pulse 74   Ht 6\' 2"  (1.88 m)   Wt (!) 323 lb 6.4 oz (146.7 kg)   SpO2 94%   BMI 41.52 kg/m    Wt Readings from Last 3 Encounters:  05/05/22 (!) 323 lb 6.4 oz (146.7 kg)  05/01/22 (!) 330 lb 3.2 oz (149.8 kg)  04/04/22 (!) 328 lb 3.2 oz (148.9 kg)     GEN: Well nourished, well developed in no acute distress NECK: No JVD; No carotid bruits CARDIAC: RRR, no murmurs, rubs, gallops RESPIRATORY:  Clear to auscultation without rales, wheezing or rhonchi  ABDOMEN: Soft, non-tender, non-distended, obese EXTREMITIES:  1+ edema to BLE; very prominent varicose veins to the bilateral lower  extremities  ASSESSMENT AND PLAN:   Coronary artery disease and unspecified vessel with angina.  Patient states he is has been having chest discomfort on and off for the past 2 months.  He has not used any of his Nitrostat.  He previously had a left heart catheterization done that showed nonobstructive CAD with 30% stenosis in the proximal LAD.  He has been continued on aspirin and Nexlizet prescription that was sent back into the pharmacy as the patient stated he had never received the new medication that he previously been ordered on  his previous appointment.  Due to continued chest discomfort over the past several months we will start him on Imdur 15 mg daily as well as he will be scheduled for a Lexiscan Myoview with his known history of nonobstructive coronary artery disease.   Shortness of breath with recent echocardiogram completed with an LVEF of 50-55% which is low normal function but no regional wall motion abnormalities were noted.  He has been scheduled for 5 further ischemic testing with his continued symptoms.  Essential hypertension blood pressure today 132/68.  He has been continued on amlodipine 10 mg daily furosemide to 20 mg as needed for leg swelling and lisinopril 20 mg daily.  Urged to continue to monitor his pressures at home as well.  Dyslipidemia with last HDL of 35 and LDL of 102 and 09/10/2021.  He has a known history of statin intolerance.  He has been started on Nexlizet and will need a repeat lipid and hepatic panel in 10 to 12 weeks.  Peripheral edema with varicose veins CEAP class III.  Patient continues to be on furosemide as needed for swelling.  Is also been encouraged to continue with conservative therapy of elevating his extremities, decrease in sodium intake, foot calf pump exercises, compression stockings minimum knee-high and at least 20 mmHg compression.  If he continues to suffer from muscle cramps or restless legs and edema he can be referred to the vein and vascular for venous insufficiency workup.  Morbid obesity with the patient stating that he has gained back approximately 50 pounds over the last 4 months of the large weight loss that he had previously had.  He has been encouraged to continue with his low calorie diet and increase his exercise as tolerated as weight loss is advised.  Disposition patient return to clinic to see MD/APP once stress testing is completed or sooner if needed.    Shared Decision Making/Informed Consent The risks [chest pain, shortness of breath, cardiac  arrhythmias, dizziness, blood pressure fluctuations, myocardial infarction, stroke/transient ischemic attack, nausea, vomiting, allergic reaction, radiation exposure, metallic taste sensation and life-threatening complications (estimated to be 1 in 10,000)], benefits (risk stratification, diagnosing coronary artery disease, treatment guidance) and alternatives of a nuclear stress test were discussed in detail with Mr. Ellenberg and he agrees to proceed.   Signed, Hershal Eriksson, NP

## 2022-05-05 NOTE — Telephone Encounter (Signed)
Please review for PA

## 2022-05-06 ENCOUNTER — Ambulatory Visit: Payer: 59

## 2022-05-14 ENCOUNTER — Telehealth: Payer: Self-pay | Admitting: Physician Assistant

## 2022-05-14 NOTE — Telephone Encounter (Signed)
BH referral faxed to [facility]; [phone]-Toni 

## 2022-05-19 ENCOUNTER — Other Ambulatory Visit: Payer: 59

## 2022-05-19 ENCOUNTER — Other Ambulatory Visit: Payer: Self-pay | Admitting: Cardiology

## 2022-05-19 ENCOUNTER — Encounter
Admission: RE | Admit: 2022-05-19 | Discharge: 2022-05-19 | Disposition: A | Discharge status: Home / Self Care | Payer: 59 | Source: Ambulatory Visit | Attending: Cardiology | Admitting: Cardiology

## 2022-05-19 DIAGNOSIS — I2510 Atherosclerotic heart disease of native coronary artery without angina pectoris: Secondary | ICD-10-CM

## 2022-05-19 DIAGNOSIS — R072 Precordial pain: Secondary | ICD-10-CM | POA: Insufficient documentation

## 2022-05-19 DIAGNOSIS — E785 Hyperlipidemia, unspecified: Secondary | ICD-10-CM

## 2022-05-19 DIAGNOSIS — R0602 Shortness of breath: Secondary | ICD-10-CM

## 2022-05-19 DIAGNOSIS — R600 Localized edema: Secondary | ICD-10-CM

## 2022-05-19 DIAGNOSIS — I10 Essential (primary) hypertension: Secondary | ICD-10-CM

## 2022-05-19 LAB — NM MYOCAR SINGLE W/SPECT
LV dias vol: 133 mL (ref 62–150)
LV sys vol: 68 mL
Nuc Stress EF: 49 %
Peak HR: 87 {beats}/min
Percent HR: 52 %
Rest HR: 65 {beats}/min
SSS: 12
ST Depression (mm): 0 mm
Stress Nuclear Isotope Dose: 29.3 mCi

## 2022-05-19 MED ORDER — TECHNETIUM TC 99M TETROFOSMIN IV KIT
29.2800 | PACK | Freq: Once | INTRAVENOUS | Status: AC | PRN
  Administered 2022-05-19: 29.28 via INTRAVENOUS

## 2022-05-19 MED ORDER — REGADENOSON 0.4 MG/5ML IV SOLN
0.4000 mg | Freq: Once | INTRAVENOUS | Status: AC
  Administered 2022-05-19: 0.4 mg via INTRAVENOUS

## 2022-05-27 NOTE — Progress Notes (Unsigned)
Cardiology Office Note:   Date:  05/28/2022  ID:  Gerald [name], DOB December 22, 1965, MRN [id number]  History of Present Illness:   Gerald [name] is a 57 y.o. male with a history of nonobstructive coronary artery disease, hyperlipidemia, morbid obesity, hypertension, carotid stenosis, type 2 diabetes, OSA on CPAP, chronic pain syndrome who is here today for follow-up on recent complaints of chest pain with outpatient testing completed.  Diagnostic heart cath demonstrated nonobstructive coronary disease with 40% proximal LAD.  Echo in 2019 revealed LVEF 60 to 65%.  He was last seen in clinic 05/05/2022 with complaints of chest discomfort he described a pressure and tightness that comes and goes with rest or exertion of will radiate down his left arm.  He had stated that he had been under an increased amount of stress due to work and was concerned.  He also had concerns of shortness of breath with his recent echocardiogram with an LVEF of 50-55% without regional wall motion abnormalities that were noted.  He did undergo Lexiscan Myoview.  He returns clinic today stating overall he is doing better.  He continues to have occasional chest discomfort but it is more in the left axillary area versus his chest since starting on Imdur.  He did undergo a Lexiscan Myoview which was considered a low risk scan.  At his last appointment he was started on Imdur with noted decrease in discomfort and blood pressure that is better controlled.  He continues to still have the swelling to his bilateral lower extremities but also continues to have dietary indiscretions at low-sodium meals.  Denies any hospitalizations or visits to the emergency department.  ROS: 10 point review of systems has been reviewed and is considered negative with exception of what is listed in the HPI  Studies Reviewed:    EKG: No new tracings were completed today  Lexiscan Myoview 05/19/22   The study is normal. The study is low risk.   No ST  deviation was noted.   LV perfusion is normal. There is no evidence of ischemia. There is no evidence of infarction.   Left ventricular function is abnormal. Global function is mildly reduced. Nuclear stress EF: 49 %. The left ventricular ejection fraction is mildly decreased (45-54%). End diastolic cavity size is mildly enlarged. End systolic cavity size is mildly enlarged.   Stress only images with no evidence of perfusion defects.  Thus, no need for rest imaging.  The study is suboptimal due to GI uptake at the inferior wall.   Calculated ejection fraction is mildly reduced.  However, there is poor tracking of left ventricular borders.  Correlate with an echocardiogram.  TTE 04/09/22  1. Left ventricular ejection fraction, by estimation, is 50 to 55%. The  left ventricle has low normal function. The left ventricle has no regional  wall motion abnormalities. Left ventricular diastolic parameters were  normal.   2. Right ventricular systolic function is normal. The right ventricular  size is normal.   3. The mitral valve is normal in structure. No evidence of mitral valve  regurgitation.   4. The aortic valve was not well visualized. Aortic valve regurgitation  is not visualized.   5. The inferior vena cava is normal in size with greater than 50%  respiratory variability, suggesting right atrial pressure of 3 mmHg.   Risk Assessment/Calculations:              Physical Exam:   VS:  BP 107/66   Pulse 64  Ht  (1.88 m)   Wt (!) 327 lb (148.3 kg)   SpO2 96%   BMI 41.98 kg/m    Wt Readings from Last 3 Encounters:  05/28/22 (!) 327 lb (148.3 kg)  05/05/22 (!) 324 lb 12.8 oz (147.3 kg)  05/05/22 (!) 323 lb 6.4 oz (146.7 kg)     GEN: Well nourished, well developed in no acute distress NECK: No JVD; No carotid bruits CARDIAC: RRR, no murmurs, rubs, gallops RESPIRATORY:  Clear to auscultation without rales, wheezing or rhonchi  ABDOMEN: Soft, non-tender,  non-distended EXTREMITIES: Trace pretibial edema; No deformity   ASSESSMENT AND PLAN:   Coronary artery disease of native vessel native heart with stable angina.  Patient recently underwent Lexiscan Myoview stress testing was considered low risk scan.  At his last visit he was started on 50 mg of Imdur daily which has decreased amount of chest discomfort that he has had.  He occasionally has some discomfort in his left axillary area but is random.  Previous left heart catheterization showed nonobstructive CAD with 30% stenosis in the proximal LAD.  He has been on aspirin and has previously had a prescription sent in for Nexlixet that he has yet to pick up due to the cost.  He was going to be given samples today.  If he does start the medication we will need a repeat lipid and hepatic panel in 10 to 12 weeks.  Shortness of breath that has improved.  Recent echocardiogram revealed LVEF of 50-55% which is low normal but without regional wall motion abnormalities.  With better control of his blood pressure shortness of breath and chest discomfort has improved.  Essential hypertension with blood pressure today of 107/66.  He has been continued on amlodipine 10 mg daily, furosemide 20 mg as needed for leg swelling and lisinopril 20 mg daily.  Encouraged to continue to monitor his pressures at home as well.  Dyslipidemia with his last LDL of 122.  With goal of 70 or less he has a longstanding history of statin intolerance.  Had previously had a prescription sent in for Nexlizet but did not pick it up due to cost.  He was to be given samples today if not tolerating the dose and a coupon card to go up to see about starting him on Zetia on return.  Varicose veins CEAP class III.  Patient continues to be on furosemide as needed for swelling.  He is encouraged to continue to participate in conservative therapy of elevating his extremities, foot Pumps, compression stockings, decreasing his sodium intake unfortunately  we discussed seeing food choices he has a large quantity of dietary indiscretions of large sodium content meals.  If he has symptoms from his varicose veins he can be referred to vein and vascular for venous insufficiency workup at that time.  Morbid obesity with BMI of 41.98.  He is encouraged to continue his low calorie diet and increase his exercise as tolerated with weight loss being advised.  Disposition patient return to clinic see MD/APP in 3 months or sooner if needed to reevaluate symptoms.        Signed, Shemika Robbs, NP

## 2022-05-28 ENCOUNTER — Encounter: Payer: Self-pay | Admitting: Cardiology

## 2022-05-28 ENCOUNTER — Ambulatory Visit: Payer: 59 | Attending: Cardiology | Admitting: Cardiology

## 2022-05-28 VITALS — BP 107/66 | HR 64 | Ht 74.0 in | Wt 327.0 lb

## 2022-05-28 DIAGNOSIS — I2510 Atherosclerotic heart disease of native coronary artery without angina pectoris: Secondary | ICD-10-CM | POA: Diagnosis not present

## 2022-05-28 DIAGNOSIS — E785 Hyperlipidemia, unspecified: Secondary | ICD-10-CM | POA: Diagnosis not present

## 2022-05-28 DIAGNOSIS — I10 Essential (primary) hypertension: Secondary | ICD-10-CM | POA: Diagnosis not present

## 2022-05-28 DIAGNOSIS — R600 Localized edema: Secondary | ICD-10-CM

## 2022-05-28 DIAGNOSIS — R0602 Shortness of breath: Secondary | ICD-10-CM | POA: Diagnosis not present

## 2022-05-28 MED ORDER — NEXLIZET 180-10 MG PO TABS: 1.0000 | ORAL_TABLET | Freq: Every day | ORAL | 0 refills | Status: DC

## 2022-05-28 NOTE — Patient Instructions (Signed)
Medication Instructions:  Your Physician recommend you continue on your current medication as directed.    *If you need a refill on your cardiac medications before your next appointment, please call your pharmacy*   Lab Work: None ordered   Testing/Procedures: None ordered   Follow-Up: At [facility], you and your health needs are our priority.  As part of our continuing mission to provide you with exceptional heart care, we have created designated Provider Care Teams.  These Care Teams include your primary Cardiologist (physician) and Advanced Practice Providers (APPs -  Physician Assistants and Nurse Practitioners) who all work together to provide you with the care you need, when you need it.  We recommend signing up for the patient portal called "MyChart".  Sign up information is provided on this After Visit Summary.  MyChart is used to connect with patients for Virtual Visits (Telemedicine).  Patients are able to view lab/test results, encounter notes, upcoming appointments, etc.  Non-urgent messages can be sent to your provider as well.   To learn more about what you can do with MyChart, go to [email or web address].    Your next appointment:   3 month(s)  Provider:   You may see Debbe Odea, MD or one of the following Advanced Practice Providers on your designated Care Team:   Nicolasa Ducking, NP Eula Listen, PA-C Cadence Fransico Michael, PA-C Charlsie Quest, NP

## 2022-05-29 ENCOUNTER — Other Ambulatory Visit (HOSPITAL_COMMUNITY): Payer: Self-pay

## 2022-05-29 ENCOUNTER — Emergency Department: Payer: 59

## 2022-05-29 ENCOUNTER — Other Ambulatory Visit: Payer: Self-pay

## 2022-05-29 ENCOUNTER — Emergency Department
Admission: EM | Admit: 2022-05-29 | Discharge: 2022-05-29 | Disposition: A | Discharge status: Home / Self Care | Payer: 59 | Attending: Emergency Medicine | Admitting: Emergency Medicine

## 2022-05-29 ENCOUNTER — Encounter: Payer: Self-pay | Admitting: Radiology

## 2022-05-29 ENCOUNTER — Ambulatory Visit: Payer: 59 | Admitting: Nurse Practitioner

## 2022-05-29 DIAGNOSIS — I10 Essential (primary) hypertension: Secondary | ICD-10-CM | POA: Diagnosis not present

## 2022-05-29 DIAGNOSIS — R1032 Left lower quadrant pain: Secondary | ICD-10-CM | POA: Insufficient documentation

## 2022-05-29 LAB — BASIC METABOLIC PANEL
Anion gap: 8 (ref 5–15)
BUN: 17 mg/dL (ref 6–20)
CO2: 25 mmol/L (ref 22–32)
Calcium: 9 mg/dL (ref 8.9–10.3)
Chloride: 100 mmol/L (ref 98–111)
Creatinine, Ser: 0.83 mg/dL (ref 0.61–1.24)
GFR, Estimated: >60 mL/min (ref >60)
Glucose, Bld: 214 mg/dL — ABNORMAL HIGH (ref 70–99)
Potassium: 4 mmol/L (ref 3.5–5.1)
Sodium: 133 mmol/L — ABNORMAL LOW (ref 135–145)

## 2022-05-29 LAB — URINALYSIS, W/ REFLEX TO CULTURE (INFECTION SUSPECTED)
Bacteria, UA: NONE SEEN
Bilirubin Urine: NEGATIVE
Glucose, UA: NEGATIVE mg/dL
Hgb urine dipstick: NEGATIVE
Ketones, ur: NEGATIVE mg/dL
Leukocytes,Ua: NEGATIVE
Nitrite: NEGATIVE
Protein, ur: NEGATIVE mg/dL
Specific Gravity, Urine: 1.013 (ref 1.005–1.030)
pH: 7 (ref 5.0–8.0)

## 2022-05-29 LAB — CBC
HCT: 42.2 % (ref 39.0–52.0)
Hemoglobin: 14.6 g/dL (ref 13.0–17.0)
MCH: 29.7 pg (ref 26.0–34.0)
MCHC: 34.6 g/dL (ref 30.0–36.0)
MCV: 85.9 fL (ref 80.0–100.0)
Platelets: 243 10*3/uL (ref 150–400)
RBC: 4.91 MIL/uL (ref 4.22–5.81)
RDW: 14 % (ref 11.5–15.5)
WBC: 8.2 10*3/uL (ref 4.0–10.5)
nRBC: 0 % (ref 0.0–0.2)

## 2022-05-29 MED ORDER — HYDROMORPHONE HCL 1 MG/ML IJ SOLN
1.0000 mg | INTRAMUSCULAR | Status: AC
  Administered 2022-05-29: 1 mg via INTRAMUSCULAR
  Filled 2022-05-29: qty 1

## 2022-05-29 MED ORDER — OXYCODONE-ACETAMINOPHEN 5-325 MG PO TABS: 1.0000 | ORAL_TABLET | Freq: Four times a day (QID) | ORAL | 0 refills | Status: AC | PRN

## 2022-05-29 MED ORDER — OXYCODONE-ACETAMINOPHEN 5-325 MG PO TABS
2.0000 | ORAL_TABLET | Freq: Once | ORAL | Status: AC
  Administered 2022-05-29: 2 via ORAL
  Filled 2022-05-29: qty 2

## 2022-05-29 MED ORDER — IOHEXOL 300 MG/ML  SOLN
100.0000 mL | Freq: Once | INTRAMUSCULAR | Status: AC | PRN
  Administered 2022-05-29: 100 mL via INTRAVENOUS

## 2022-05-29 NOTE — ED Notes (Addendum)
Ultrasound in progress at bedside.

## 2022-05-29 NOTE — ED Triage Notes (Signed)
Pt to ED for left groin pain started this morning. Reports pain to groin with urination. Denies any swelling. Reports has had 3 hernia repairs. Pt appears uncomfortable.  Reports feeling similar with previous hernias.

## 2022-05-29 NOTE — ED Notes (Signed)
Pt c/o left sided groin pain that radiates down leg and makes it painful to ambulate/move and makes urination painful. Pt denies swelling or discoloration, reports history of hernia repairs. Pt denies blood in urine or discharge. Pt denies muscular injury/straining. Pt states pain started after work today.

## 2022-05-29 NOTE — ED Provider Notes (Signed)
[facility] Provider Note    Event Date/Time   First MD Initiated Contact with Patient 05/29/22 1451     (approximate)   History   Chief Complaint: Groin Pain   HPI  Gerald [name] is a 57 y.o. male with a history of hypertension, obesity who comes ED complaining of left groin pain that started this morning.  Worse with urination.  No hematuria.  No penile discharge.  He has had multiple hernia repairs in the past, and he reports this pain feels similar to prior hernia issues.  Denies any trauma or heavy lifting recently.  Pain is constant and severe.  Nonradiating.     Physical Exam   Triage Vital Signs: ED Triage Vitals  Enc Vitals Group     BP 05/29/22 1232 (!) 144/78     Pulse Rate 05/29/22 1232 82     Resp 05/29/22 1232 19     Temp 05/29/22 1232 97.9 F (36.6 C)     Temp Source 05/29/22 1232 Oral     SpO2 05/29/22 1232 98 %     Weight 05/29/22 1232 (!) 325 lb (147.4 kg)     Height 05/29/22 1232  (1.88 m)     Head Circumference --      Peak Flow --      Pain Score 05/29/22 1234 10     Pain Loc --      Pain Edu? --      Excl. in GC? --     Most recent vital signs: Vitals:   05/29/22 1636 05/29/22 1637  BP:  (!) 133/93  Pulse:  72  Resp:  17  Temp: 98 F (36.7 C)   SpO2:  94%    General: Awake, no distress.  CV:  Good peripheral perfusion.  Regular rate and rhythm Resp:  Normal effort.  Clear to auscultation bilaterally Abd:  No distention.  Soft and nontender Other:  Genital exam reveals no lesions.  No discharge, no scrotal mass or horizontal oriented testicle.  No epididymis tenderness.   ED Results / Procedures / Treatments   Labs (all labs ordered are listed, but only abnormal results are displayed) Labs Reviewed  BASIC METABOLIC PANEL - Abnormal; Notable for the following components:      Result Value   Sodium 133 (*)    Glucose, Bld 214 (*)    All other components within normal limits  URINALYSIS, W/  REFLEX TO CULTURE (INFECTION SUSPECTED) - Abnormal; Notable for the following components:   Color, Urine YELLOW (*)    APPearance CLEAR (*)    All other components within normal limits  CBC     EKG    RADIOLOGY Ultrasound scrotum interpreted by me, negative for mass, normal blood flow.  Radiology report reviewed.  CT abdomen pelvis pending   PROCEDURES:  Procedures   MEDICATIONS ORDERED IN ED: Medications  HYDROmorphone (DILAUDID) injection 1 mg (1 mg Intramuscular Given 05/29/22 1546)  iohexol (OMNIPAQUE) 300 MG/ML solution 100 mL (100 mLs Intravenous Contrast Given 05/29/22 1719)  oxyCODONE-acetaminophen (PERCOCET/ROXICET) 5-325 MG per tablet 2 tablet (2 tablets Oral Given 05/29/22 1814)     IMPRESSION / MDM / ASSESSMENT AND PLAN / ED COURSE  I reviewed the triage vital signs and the nursing notes.  DDx: Epididymitis, torsion, kidney stone, inguinal hernia, cystitis.  Doubt STI or pelvic abscess  Patient's presentation is most consistent with acute presentation with potential threat to life or bodily function.  Patient presents with  left scrotal pain.  No inflammatory soft tissue changes.  Vitals unremarkable, labs unremarkable.  Ultrasound scrotum unremarkable.  Will obtain CT abdomen pelvis to further evaluate.   Clinical Course as of 05/29/22 1905  Thu May 29, 2022  1902 nRBC: 0.0 [PS]    Clinical Course User Index [PS] Sharman Cheek, MD   ----------------------------------------- 7:05 PM on 05/29/2022 ----------------------------------------- CT unremarkable. Pain improved.  No evidence of torsion, infection, incarcerated hernia, ischemia, renal disease, kidney stone, diverticulitis. May be having subclinical disruption of mesh/anchor/scar tissue around prior hernia repair. He will make an appt with his surgeon for follow up eval.   FINAL CLINICAL IMPRESSION(S) / ED DIAGNOSES   Final diagnoses:  Left groin pain     Rx / DC Orders   ED Discharge  Orders          Ordered    oxyCODONE-acetaminophen (PERCOCET) 5-325 MG tablet  Every 6 hours PRN        05/29/22 1905             Note:  This document was prepared using Dragon voice recognition software and may include unintentional dictation errors.   Sharman Cheek, MD 05/29/22 [phone]

## 2022-05-30 ENCOUNTER — Other Ambulatory Visit (HOSPITAL_COMMUNITY): Payer: Self-pay

## 2022-06-09 ENCOUNTER — Encounter: Payer: Self-pay | Admitting: Surgery

## 2022-06-09 ENCOUNTER — Ambulatory Visit: Payer: 59 | Admitting: Surgery

## 2022-06-09 VITALS — BP 144/88 | HR 64 | Temp 98.0°F | Ht 74.0 in | Wt 329.0 lb

## 2022-06-09 DIAGNOSIS — R1032 Left lower quadrant pain: Secondary | ICD-10-CM | POA: Diagnosis not present

## 2022-06-09 NOTE — Progress Notes (Signed)
06/09/2022  History of Present Illness: Gerald [name] is a 57 y.o. male presenting for evaluation of left groin pain.  The patient is s/p robotic assisted bilateral inguinal hernia repair and open umbilical hernia repair on 04/16/21.  The patient had an umbilical wound infection which was treated with Augmentin but otherwise did not have other issues.  The patient reports that about 2 weeks ago he started having left groin pain that was very sharp in nature.  He denies having any strenuous activity or lifting heavy weights.  He had been golfing recently.  He presented to the emergency room on 05/29/2022 when this had just started.  He had a CT scan and ultrasound which did not show any hernia recurrence or any fluid collections or other complicating factors.  The patient reports today that his pain has been improving although has not fully resolved yet.  Denies any pain in the right groin.  Denies any bulging sensation.  Past Medical History: Past Medical History:  Diagnosis Date   Abnormal LFTs    Anxiety    Atypical chest pain    Borderline diabetes    CAD (coronary artery disease)    a.) PCI and stent placement (unknown type) to mLAD and mLCx; date unknown. b.) LHC 09/10/2010: 40% mLAD; no intervention. c.) LHC 09/12/2011: EF 60%; 30% ISR mLAD, 40% dLAD, 30% pLCx, 30% ISR mLCx, 40% mRCA; no intervention. d.) LHC 05/23/2014: EF >55%; 30% p-mLAD; no intervention. e.) LHC 06/11/2016: EF 55-65%; LVEDP norm; 30-40% p-mLAD; no intervention.   Carcinoma (HCC)    Chronic pain syndrome    Depression    Fundic gland polyps of stomach, benign    Gastritis    GERD (gastroesophageal reflux disease)    Hepatic steatosis    Hyperlipidemia    Hypertension    Migraines    Myocardial infarction [facility]) 2008   was told by PCP   Nonischemic cardiomyopathy (HCC)    a.) TTE 09/12/2011: EF 35-45%. b.) LHC 09/12/2011: EF 60%. c.) TTE 06/04/2012: EF 55-60%. d.) LHC 05/23/2014: EF >55%. e.) TTE 06/11/2016: EF  55-60%; G1DD. f.) LHC 06/11/2016: EF 55-65%. g.) TTE 01/08/2018: EF 60-65%.   Obesity    Occlusion and stenosis of bilateral carotid arteries    OSA on CPAP    Osteoarthritis of both knees    Shortness of breath    Squamous cell carcinoma of skin 02/27/2020   left distal lat deltoid - [facility]     Past Surgical History: Past Surgical History:  Procedure Laterality Date   BONE EXCISION Right 11/21/2020   Procedure: PART EXCISION BONE- PHALANX;  Surgeon: Gwyneth Revels, DPM;  Location: [facility];  Service: Podiatry;  Laterality: Right;   CARDIAC CATHETERIZATION N/A 09/2011   [facility]; EF 50% with 30% mid LAD stenosis and no obstructive disease.   CARDIAC CATHETERIZATION  09/2010   [facility]; Mid LAD 40% stenosis; Mid Circumflex:Normal; Mid RCA; Normal   CARDIAC CATHETERIZATION     COLONOSCOPY     COLONOSCOPY WITH PROPOFOL N/A 04/10/2021   Procedure: COLONOSCOPY WITH PROPOFOL;  Surgeon: Wyline Mood, MD;  Location: [facility] ENDOSCOPY;  Service: Gastroenterology;  Laterality: N/A;   ESOPHAGOGASTRODUODENOSCOPY N/A 04/10/2021   Procedure: ESOPHAGOGASTRODUODENOSCOPY (EGD);  Surgeon: Wyline Mood, MD;  Location: [facility] ENDOSCOPY;  Service: Gastroenterology;  Laterality: N/A;   ESOPHAGOGASTRODUODENOSCOPY (EGD) WITH PROPOFOL N/A 06/10/2017   Procedure: ESOPHAGOGASTRODUODENOSCOPY (EGD) WITH PROPOFOL;  Surgeon: Wyline Mood, MD;  Location: [facility] ENDOSCOPY;  Service: Gastroenterology;  Laterality: N/A;   ESOPHAGOGASTRODUODENOSCOPY (EGD) WITH  PROPOFOL N/A 01/01/2022   Procedure: ESOPHAGOGASTRODUODENOSCOPY (EGD) WITH PROPOFOL;  Surgeon: Wyline Mood, MD;  Location: [facility] ENDOSCOPY;  Service: Gastroenterology;  Laterality: N/A;   ESOPHAGOGASTRODUODENOSCOPY (EGD) WITH PROPOFOL N/A 01/06/2022   Procedure: ESOPHAGOGASTRODUODENOSCOPY (EGD) WITH PROPOFOL;  Surgeon: Wyline Mood, MD;  Location: [facility] ENDOSCOPY;  Service: Gastroenterology;  Laterality: N/A;   HAMMER TOE SURGERY Right 11/21/2020   Procedure: HAMMERTOE CORRECTION;   Surgeon: Gwyneth Revels, DPM;  Location: [facility];  Service: Podiatry;  Laterality: Right;   INSERTION OF MESH  04/16/2021   Procedure: INSERTION OF MESH;  Surgeon: Henrene Dodge, MD;  Location: [facility] OR[facility];  Service: General;;   KNEE ARTHROSCOPY WITH MEDIAL MENISECTOMY Right 09/16/2012   Procedure: RIGHT KNEE ARTHROSCOPY WITH MEDIAL AND LATERAL MENISECTOMY, CHONDROPLASTY;  Surgeon: Loreta Ave, MD;  Location: [facility];  Service: Orthopedics;  Laterality: Right;  RIGHT KNEE SCOPE MEDIAL MENISCECTOMY   LEFT HEART CATH AND CORONARY ANGIOGRAPHY N/A 06/11/2016   Procedure: Left Heart Cath and Coronary Angiography;  Surgeon: Antonieta Iba, MD;  Location: [facility] INVASIVE CV LAB;  Service: Cardiovascular;  Laterality: N/A;   UMBILICAL HERNIA REPAIR N/A 04/16/2021   Procedure: HERNIA REPAIR UMBILICAL ADULT, open;  Surgeon: Henrene Dodge, MD;  Location: [facility] ORS;  Service: General;  Laterality: N/A;    Home Medications: Prior to Admission medications   Medication Sig Start Date End Date Taking? Authorizing Provider  citalopram (CELEXA) 40 MG tablet TAKE 1 TABLET BY MOUTH EVERY DAY 12/16/21  Yes Lyndon Code, MD  Dulaglutide (TRULICITY) 1.5 MG/0.5ML SOPN Inject 1.5 mg into the skin once a week. 05/05/22  Yes McDonough, Lauren K, PA-C  furosemide (LASIX) 20 MG tablet TAKE 1 TABLET BY MOUTH EVERY DAY AS NEEDED FOR LEG SWELLING 12/16/21  Yes Lyndon Code, MD  gabapentin (NEURONTIN) 300 MG capsule Take 1 capsule (300 mg total) by mouth 3 (three) times daily. 04/03/22  Yes McDonough, Lauren K, PA-C  lisinopril (ZESTRIL) 20 MG tablet Take 1 tablet (20 mg total) by mouth daily. 05/01/22  Yes McDonough, Lauren K, PA-C  Multiple Vitamins-Minerals (MULTIVITAMIN ADULTS PO) Take 1 tablet by mouth daily.   Yes [provider]  nitroGLYCERIN (NITROSTAT) 0.4 MG SL tablet Place 1 tablet (0.4 mg total) under the tongue every 5 (five) minutes x 3 doses as needed for chest pain. 04/25/21  Yes  Marrion Coy, MD  Testosterone 20.25 MG/ACT (1.62%) GEL Place 40.5 mg onto the skin daily. This would be two pumps daily 01/14/22  Yes McGowan, Carollee Herter A, PA-C    Allergies: Allergies  Allergen Reactions   Statins Hives and Rash    Review of Systems: Review of Systems  Constitutional:  Negative for chills and fever.  Respiratory:  Negative for shortness of breath.   Cardiovascular:  Negative for chest pain.  Gastrointestinal:  Positive for abdominal pain. Negative for nausea and vomiting.  Skin:  Positive for rash.    Physical Exam BP (!) 144/88   Pulse 64   Temp 98 F (36.7 C)   Ht 6\' 2"  (1.88 m)   Wt (!) 329 lb (149.2 kg)   SpO2 98%   BMI 42.24 kg/m  CONSTITUTIONAL: No acute distress, well-nourished HEENT:  Normocephalic, atraumatic, extraocular motion intact. RESPIRATORY:  Normal respiratory effort without pathologic use of accessory muscles. CARDIOVASCULAR: Regular rhythm and rate. GI: The abdomen is soft, nondistended, with some localized tenderness to palpation in the mid to lateral aspect of the left groin.  There is no evidence of any  hernia recurrence when the patient coughs or strains.  Incisions are well-healed and there is no evidence of hernia recurrence at the umbilicus either. SKIN:   The patient does have a erythematous rash at the crease of bilateral groin areas likely related to a yeast type of rash. NEUROLOGIC:  Motor and sensation is grossly normal.  Cranial nerves are grossly intact. PSYCH:  Alert and oriented to person, place and time. Affect is normal.  Labs/Imaging: Labs from 05/29/2022: Sodium 133, potassium 4.0, chloride 100, CO2 25, BUN 17, creatinine 0.83.  WBC 8.2, hemoglobin 14.6, hematocrit 42.2, platelets 243.  Ultrasound scrotum with Doppler on 05/29/2022: IMPRESSION: Normal ultrasound appearance of the testicles. No evidence of testicular mass, torsion, or inflammation. Small left epididymal cyst or spermatocele.  CT abdomen/pelvis on  05/29/2022: IMPRESSION: No acute findings. Hepatic steatosis.  Assessment and Plan: This is a 57 y.o. male with left groin pain  --Discussed with the patient the findings on his CT scan and ultrasound.  No evidence of hernia recurrence on either imaging.  Also, on today's exam, no evidence of hernia recurrence.  Discussed with the patient that perhaps this may be a pulled muscle or with the increased activity some of the scar tissue may have pulled, creating the discomfort.  He reports that the pain has been improving.  Discussed with him that this should continue to improve and recommended to take it easy with activity level over the next 4 weeks.  If no improvement or if there's any worsening, he should contact us again so we can re-evaluate him.  Can take Tylenol or Ibuprofen for pain control.  Can use ice packs to help with any inflammation as well. --Patient understands this plan and all of his questions have been answered.  I spent 20 minutes dedicated to the care of this patient on the date of this encounter to include pre-visit review of records, face-to-face time with the patient discussing diagnosis and management, and any post-visit coordination of care.   Howie Ill, MD [facility]

## 2022-06-09 NOTE — Patient Instructions (Signed)
   Follow-up with our office as needed.  Please call and ask to speak with a nurse if you develop questions or concerns.  

## 2022-06-12 ENCOUNTER — Ambulatory Visit: Payer: 59 | Admitting: Physician Assistant

## 2022-07-02 ENCOUNTER — Telehealth: Payer: Self-pay

## 2022-07-02 ENCOUNTER — Other Ambulatory Visit: Payer: Self-pay | Admitting: Physician Assistant

## 2022-07-02 DIAGNOSIS — E1165 Type 2 diabetes mellitus with hyperglycemia: Secondary | ICD-10-CM

## 2022-07-02 MED ORDER — TRULICITY 3 MG/0.5ML ~~LOC~~ SOAJ
3.0000 mg | SUBCUTANEOUS | 2 refills | Status: DC
Start: 2022-07-02 — End: 2022-09-01

## 2022-07-02 NOTE — Telephone Encounter (Signed)
Patient notified

## 2022-07-09 ENCOUNTER — Telehealth: Payer: Self-pay

## 2022-07-11 ENCOUNTER — Telehealth: Payer: Self-pay

## 2022-07-11 NOTE — Telephone Encounter (Signed)
Spoke with patient he is going to call around and give [place] a call back.

## 2022-07-11 NOTE — Telephone Encounter (Signed)
Left message for patient to give office a call.

## 2022-07-20 ENCOUNTER — Other Ambulatory Visit: Payer: Self-pay | Admitting: Internal Medicine

## 2022-07-20 DIAGNOSIS — F411 Generalized anxiety disorder: Secondary | ICD-10-CM

## 2022-07-20 DIAGNOSIS — I10 Essential (primary) hypertension: Secondary | ICD-10-CM

## 2022-07-20 DIAGNOSIS — M7989 Other specified soft tissue disorders: Secondary | ICD-10-CM

## 2022-08-18 ENCOUNTER — Ambulatory Visit (INDEPENDENT_AMBULATORY_CARE_PROVIDER_SITE_OTHER): Payer: 59 | Admitting: Internal Medicine

## 2022-08-18 VITALS — BP 147/87 | HR 63 | Resp 16 | Ht 74.0 in | Wt 326.0 lb

## 2022-08-18 DIAGNOSIS — I10 Essential (primary) hypertension: Secondary | ICD-10-CM | POA: Diagnosis not present

## 2022-08-18 DIAGNOSIS — Z7189 Other specified counseling: Secondary | ICD-10-CM | POA: Diagnosis not present

## 2022-08-18 DIAGNOSIS — G4733 Obstructive sleep apnea (adult) (pediatric): Secondary | ICD-10-CM | POA: Diagnosis not present

## 2022-08-18 DIAGNOSIS — F5112 Insufficient sleep syndrome: Secondary | ICD-10-CM | POA: Diagnosis not present

## 2022-08-18 NOTE — Progress Notes (Unsigned)
[facility] [address] [place], [place] [address]  Pulmonary Sleep Medicine   Office Visit Note  Patient Name: MICHELL KADER DOB: [birth year] MRN [id number]    Chief Complaint: Obstructive Sleep Apnea visit  Brief History:  Reyce is seen today for a 3 month follow up on APAP at 4-20 cmh20.  The patient has a 8 year history of sleep apnea. Patient is using PAP nightly.  The patient feels not rested after sleeping with PAP.  The patient reports benefiting from PAP use. Reported sleepiness is not improved and the Epworth Sleepiness Score is 12 out of 24. The patient does not take naps. The patient complains of the following: No complaints.  The compliance download shows 80% compliance with an average use time of 5:47 hours. The AHI is 4.1.  The patient does not complain of limb movements disrupting sleep.  ROS  General: (-) fever, (-) chills, (-) night sweat Nose and Sinuses: (-) nasal stuffiness or itchiness, (-) postnasal drip, (-) nosebleeds, (-) sinus trouble. Mouth and Throat: (-) sore throat, (-) hoarseness. Neck: (-) swollen glands, (-) enlarged thyroid, (-) neck pain. Respiratory: - cough, - shortness of breath, - wheezing. Neurologic: - numbness, - tingling. Psychiatric: + anxiety, + depression   Current Medication: Outpatient Encounter Medications as of 08/18/2022  Medication Sig   lisinopril-hydrochlorothiazide (ZESTORETIC) 20-25 MG tablet Take 1 tablet by mouth daily.   citalopram (CELEXA) 40 MG tablet TAKE 1 TABLET BY MOUTH EVERY DAY   Dulaglutide (TRULICITY) 3 MG/0.5ML SOPN Inject 3 mg as directed once a week.   furosemide (LASIX) 20 MG tablet TAKE 1 TABLET BY MOUTH EVERY DAY AS NEEDED FOR LEG SWELLING   gabapentin (NEURONTIN) 300 MG capsule Take 1 capsule (300 mg total) by mouth 3 (three) times daily.   Multiple Vitamins-Minerals (MULTIVITAMIN ADULTS PO) Take 1 tablet by mouth daily.   nitroGLYCERIN (NITROSTAT) 0.4 MG SL tablet Place 1 tablet  (0.4 mg total) under the tongue every 5 (five) minutes x 3 doses as needed for chest pain.   Testosterone 20.25 MG/ACT (1.62%) GEL Place 40.5 mg onto the skin daily. This would be two pumps daily   [DISCONTINUED] lisinopril (ZESTRIL) 20 MG tablet Take 1 tablet (20 mg total) by mouth daily.   No facility-administered encounter medications on file as of 08/18/2022.    Surgical History: Past Surgical History:  Procedure Laterality Date   BONE EXCISION Right 11/21/2020   Procedure: PART EXCISION BONE- PHALANX;  Surgeon: Gwyneth Revels, DPM;  Location: [facility];  Service: Podiatry;  Laterality: Right;   CARDIAC CATHETERIZATION N/A 09/2011   [facility]; EF 50% with 30% mid LAD stenosis and no obstructive disease.   CARDIAC CATHETERIZATION  09/2010   [facility]; Mid LAD 40% stenosis; Mid Circumflex:Normal; Mid RCA; Normal   CARDIAC CATHETERIZATION     COLONOSCOPY     COLONOSCOPY WITH PROPOFOL N/A 04/10/2021   Procedure: COLONOSCOPY WITH PROPOFOL;  Surgeon: Wyline Mood, MD;  Location: [facility] ENDOSCOPY;  Service: Gastroenterology;  Laterality: N/A;   ESOPHAGOGASTRODUODENOSCOPY N/A 04/10/2021   Procedure: ESOPHAGOGASTRODUODENOSCOPY (EGD);  Surgeon: Wyline Mood, MD;  Location: [facility] ENDOSCOPY;  Service: Gastroenterology;  Laterality: N/A;   ESOPHAGOGASTRODUODENOSCOPY (EGD) WITH PROPOFOL N/A 06/10/2017   Procedure: ESOPHAGOGASTRODUODENOSCOPY (EGD) WITH PROPOFOL;  Surgeon: Wyline Mood, MD;  Location: [facility] ENDOSCOPY;  Service: Gastroenterology;  Laterality: N/A;   ESOPHAGOGASTRODUODENOSCOPY (EGD) WITH PROPOFOL N/A 01/01/2022   Procedure: ESOPHAGOGASTRODUODENOSCOPY (EGD) WITH PROPOFOL;  Surgeon: Wyline Mood, MD;  Location: [facility] ENDOSCOPY;  Service: Gastroenterology;  Laterality:  N/A;   ESOPHAGOGASTRODUODENOSCOPY (EGD) WITH PROPOFOL N/A 01/06/2022   Procedure: ESOPHAGOGASTRODUODENOSCOPY (EGD) WITH PROPOFOL;  Surgeon: Wyline Mood, MD;  Location: [facility] ENDOSCOPY;  Service: Gastroenterology;  Laterality: N/A;   HAMMER TOE  SURGERY Right 11/21/2020   Procedure: HAMMERTOE CORRECTION;  Surgeon: Gwyneth Revels, DPM;  Location: [facility];  Service: Podiatry;  Laterality: Right;   INSERTION OF MESH  04/16/2021   Procedure: INSERTION OF MESH;  Surgeon: Henrene Dodge, MD;  Location: [facility] ORS;  Service: General;;   KNEE ARTHROSCOPY WITH MEDIAL MENISECTOMY Right 09/16/2012   Procedure: RIGHT KNEE ARTHROSCOPY WITH MEDIAL AND LATERAL MENISECTOMY, CHONDROPLASTY;  Surgeon: Loreta Ave, MD;  Location: [facility];  Service: Orthopedics;  Laterality: Right;  RIGHT KNEE SCOPE MEDIAL MENISCECTOMY   LEFT HEART CATH AND CORONARY ANGIOGRAPHY N/A 06/11/2016   Procedure: Left Heart Cath and Coronary Angiography;  Surgeon: Antonieta Iba, MD;  Location: [facility] INVASIVE CV LAB;  Service: Cardiovascular;  Laterality: N/A;   UMBILICAL HERNIA REPAIR N/A 04/16/2021   Procedure: HERNIA REPAIR UMBILICAL ADULT, open;  Surgeon: Henrene Dodge, MD;  Location: [facility] ORS;  Service: General;  Laterality: N/A;    Medical History: Past Medical History:  Diagnosis Date   Abnormal LFTs    Anxiety    Atypical chest pain    Borderline diabetes    CAD (coronary artery disease)    a.) PCI and stent placement (unknown type) to mLAD and mLCx; date unknown. b.) LHC 09/10/2010: 40% mLAD; no intervention. c.) LHC 09/12/2011: EF 60%; 30% ISR mLAD, 40% dLAD, 30% pLCx, 30% ISR mLCx, 40% mRCA; no intervention. d.) LHC 05/23/2014: EF >55%; 30% p-mLAD; no intervention. e.) LHC 06/11/2016: EF 55-65%; LVEDP norm; 30-40% p-mLAD; no intervention.   Carcinoma (HCC)    Chronic pain syndrome    Depression    Fundic gland polyps of stomach, benign    Gastritis    GERD (gastroesophageal reflux disease)    Hepatic steatosis    Hyperlipidemia    Hypertension    Migraines    Myocardial infarction [facility]) 2008   was told by PCP   Nonischemic cardiomyopathy (HCC)    a.) TTE 09/12/2011: EF 35-45%. b.) LHC 09/12/2011: EF 60%. c.) TTE 06/04/2012: EF  55-60%. d.) LHC 05/23/2014: EF >55%. e.) TTE 06/11/2016: EF 55-60%; G1DD. f.) LHC 06/11/2016: EF 55-65%. g.) TTE 01/08/2018: EF 60-65%.   Obesity    Occlusion and stenosis of bilateral carotid arteries    OSA on CPAP    Osteoarthritis of both knees    Shortness of breath    Squamous cell carcinoma of skin 02/27/2020   left distal lat deltoid - [facility]    Family History: Non contributory to the present illness  Social History: Social History   Socioeconomic History   Marital status: Married    Spouse name: Not on file   Number of children: 3   Years of education: Not on file   Highest education level: Not on file  Occupational History   Occupation: Camera operator  Tobacco Use   Smoking status: Never    Passive exposure: Past   Smokeless tobacco: Former    Types: Chew    Quit date: 1987  Vaping Use   Vaping status: Never Used  Substance and Sexual Activity   Alcohol use: Not Currently   Drug use: No   Sexual activity: Yes  Other Topics Concern   Not on file  Social History Narrative   Married.  78 yo son.   Wife  on disability for bipolar disorder.     Social Determinants of Health   Financial Resource Strain: Not on file  Food Insecurity: Not on file  Transportation Needs: Not on file  Physical Activity: Not on file  Stress: Not on file  Social Connections: Unknown (11/18/2021)   Received from [facility]   Social Network    Social Network: Not on file  Intimate Partner Violence: Unknown (11/18/2021)   Received from [facility]   HITS    Physically Hurt: Not on file    Insult or Talk Down To: Not on file    Threaten Physical Harm: Not on file    Scream or Curse: Not on file    Vital Signs: Blood pressure (!) 147/87, pulse 63, resp. rate 16, height 6\' 2"  (1.88 m), weight (!) 326 lb (147.9 kg), SpO2 97%. Body mass index is 41.86 kg/m.    Examination: General Appearance: The patient is well-developed, well-nourished, and in no distress. Neck  Circumference: 49 cm Skin: Gross inspection of skin unremarkable. Head: normocephalic, no gross deformities. Eyes: no gross deformities noted. ENT: ears appear grossly normal Neurologic: Alert and oriented. No involuntary movements.  STOP BANG RISK ASSESSMENT S (snore) Have you been told that you snore?     NO   T (tired) Are you often tired, fatigued, or sleepy during the day?   NO  O (obstruction) Do you stop breathing, choke, or gasp during sleep? NO   P (pressure) Do you have or are you being treated for high blood pressure? YES   B (BMI) Is your body index greater than 35 kg/m? YES   A (age) Are you 47 years old or older? YES   N (neck) Do you have a neck circumference greater than 16 inches?   YES   G (gender) Are you a male? YES   TOTAL STOP/BANG "YES" ANSWERS 5       A STOP-Bang score of 2 or less is considered low risk, and a score of 5 or more is high risk for having either moderate or severe OSA. For people who score 3 or 4, doctors may need to perform further assessment to determine how likely they are to have OSA.         EPWORTH SLEEPINESS SCALE:  Scale:  (0)= no chance of dozing; (1)= slight chance of dozing; (2)= moderate chance of dozing; (3)= high chance of dozing  Chance  Situtation    Sitting and reading: 3    Watching TV: 3    Sitting Inactive in public: 1    As a passenger in car: 0      Lying down to rest: 2    Sitting and talking: 0    Sitting quielty after lunch: 3    In a car, stopped in traffic: 0   TOTAL SCORE:   12 out of 24    SLEEP STUDIES:  SPLIT (05/16)  AHI 97.7, min SPO2 73%, CPAP at 9 cmh20 HST (09/23)  AHI 19.8, supine AHI 54.8, min SPO2 71%   CPAP COMPLIANCE DATA:  Date Range: 05/05/22 - 08/12/22  Average Daily Use: 5:47 hours  Median Use: 5:55 hours  Compliance for > 4 Hours: 80 days  AHI: 4.1 respiratory events per hour  Days Used: 94/100  Mask Leak: 9.1  95th Percentile Pressure: 10.8  cmh20         LABS: Recent Results (from the past 2160 hour(s))  CBC     Status: None  Collection Time: 05/29/22 12:36 PM  Result Value Ref Range   WBC 8.2 4.0 - 10.5 K/uL   RBC 4.91 4.22 - 5.81 MIL/uL   Hemoglobin 14.6 13.0 - 17.0 g/dL   HCT 16.1 09.6 - 04.5 %   MCV 85.9 80.0 - 100.0 fL   MCH 29.7 26.0 - 34.0 pg   MCHC 34.6 30.0 - 36.0 g/dL   RDW 40.9 81.1 - 91.4 %   Platelets 243 150 - 400 K/uL   nRBC 0.0 0.0 - 0.2 %    Comment: Performed at [facility], [address], [place], [place] [address]  Basic metabolic panel     Status: Abnormal   Collection Time: 05/29/22 12:36 PM  Result Value Ref Range   Sodium 133 (L) 135 - 145 mmol/L   Potassium 4.0 3.5 - 5.1 mmol/L   Chloride 100 98 - 111 mmol/L   CO2 25 22 - 32 mmol/L   Glucose, Bld 214 (H) 70 - 99 mg/dL    Comment: Glucose reference range applies only to samples taken after fasting for at least 8 hours.   BUN 17 6 - 20 mg/dL   Creatinine, Ser 6.21 0.61 - 1.24 mg/dL   Calcium 9.0 8.9 - 30.8 mg/dL   GFR, Estimated >65 >78 mL/min    Comment: (NOTE) Calculated using the CKD-EPI Creatinine Equation (2021)    Anion gap 8 5 - 15    Comment: Performed at [facility], [address] [place], [place] [address]  Urinalysis, w/ Reflex to Culture (Infection Suspected) -Urine, Clean Catch     Status: Abnormal   Collection Time: 05/29/22  6:13 PM  Result Value Ref Range   Specimen Source URINE, RANDOM     Comment: CORRECTED ON 04/25 AT 1824: PREVIOUSLY REPORTED AS URINE, CLEAN CATCH   Color, Urine YELLOW (A) YELLOW   APPearance CLEAR (A) CLEAR   Specific Gravity, Urine 1.013 1.005 - 1.030   pH 7.0 5.0 - 8.0   Glucose, UA NEGATIVE NEGATIVE mg/dL   Hgb urine dipstick NEGATIVE NEGATIVE   Bilirubin Urine NEGATIVE NEGATIVE   Ketones, ur NEGATIVE NEGATIVE mg/dL   Protein, ur NEGATIVE NEGATIVE mg/dL   Nitrite NEGATIVE NEGATIVE   Leukocytes,Ua NEGATIVE NEGATIVE   RBC / HPF 0-5 0 - 5 RBC/hpf   WBC, UA  0-5 0 - 5 WBC/hpf    Comment:        Reflex urine culture not performed if WBC <=10, OR if Squamous epithelial cells >5. If Squamous epithelial cells >5 suggest recollection.    Bacteria, UA NONE SEEN NONE SEEN   Squamous Epithelial / HPF 0-5 0 - 5 /HPF    Comment: Performed at [facility], [address] [place], [place] [address]    Radiology: CT ABDOMEN PELVIS W CONTRAST  Result Date: 05/29/2022 CLINICAL DATA:  Left lower quadrant pain EXAM: CT ABDOMEN AND PELVIS WITH CONTRAST TECHNIQUE: Multidetector CT imaging of the abdomen and pelvis was performed using the standard protocol following bolus administration of intravenous contrast. RADIATION DOSE REDUCTION: This exam was performed according to the departmental dose-optimization program which includes automated exposure control, adjustment of the mA and/or kV according to patient size and/or use of iterative reconstruction technique. CONTRAST:  OMNIPAQUE IOHEXOL 300 MG/ML  SOLN COMPARISON:  04/24/2021 FINDINGS: Lower chest: No acute abnormality Hepatobiliary: Diffuse low-density throughout the liver compatible with fatty infiltration. No focal abnormality. Gallbladder unremarkable. Pancreas: No focal abnormality or ductal dilatation. Spleen: No focal abnormality.  Normal  size. Adrenals/Urinary Tract: No adrenal abnormality. No focal renal abnormality. No stones or hydronephrosis. Urinary bladder is unremarkable. Stomach/Bowel: Stomach, large and small bowel grossly unremarkable. Normal appendix. Vascular/Lymphatic: No evidence of aneurysm or adenopathy. Reproductive: No visible focal abnormality. Other: No free fluid or free air. Musculoskeletal: No acute bony abnormality. IMPRESSION: No acute findings. Hepatic steatosis. Electronically Signed   By: Charlett Nose M.D.   On: 05/29/2022 17:40   US SCROTUM W/DOPPLER  Result Date: 05/29/2022 CLINICAL DATA:  Left testicular and groin pain. Pain starting this morning. EXAM: SCROTAL  ULTRASOUND DOPPLER ULTRASOUND OF THE TESTICLES TECHNIQUE: Complete ultrasound examination of the testicles, epididymis, and other scrotal structures was performed. Color and spectral Doppler ultrasound were also utilized to evaluate blood flow to the testicles. COMPARISON:  CT 04/24/2021 FINDINGS: Right testicle Measurements: 3.3 x 2.1 x 2.9 cm. No mass or microlithiasis visualized. Left testicle Measurements: 4 x 1.9 x 2.8 cm. No mass or microlithiasis visualized. Right epididymis:  Normal in size and appearance. Left epididymis: Small cysts or spermatoceles are demonstrated, largest measuring about 0.5 cm diameter. Hydrocele:  None visualized. Varicocele:  None visualized. Pulsed Doppler interrogation of both testes demonstrates normal low resistance arterial and venous waveforms bilaterally. Symmetrical flow to both testes on color flow Doppler imaging. IMPRESSION: Normal ultrasound appearance of the testicles. No evidence of testicular mass, torsion, or inflammation. Small left epididymal cyst or spermatocele. Electronically Signed   By: Burman Nieves M.D.   On: 05/29/2022 15:58    No results found.  No results found.    Assessment and Plan: Patient Active Problem List   Diagnosis Date Noted   OSA (obstructive sleep apnea) 08/18/2022   CPAP use counseling 08/18/2022   Essential hypertension 08/18/2022   Melena 01/06/2022   Postoperative abdominal pain 04/24/2021   Mesenteric panniculitis (HCC) 04/24/2021   Orthopnea    Infection of superficial incisional surgical site after procedure    Non-recurrent bilateral inguinal hernia without obstruction or gangrene    Umbilical hernia without obstruction and without gangrene    Impingement syndrome of right shoulder region 11/07/2019   Chest pain 01/08/2018   Gastroesophageal reflux disease without esophagitis 08/23/2017   Urinary tract infection without hematuria 06/14/2017   Generalized anxiety disorder 02/02/2017   Abnormal results of  liver function studies 02/02/2017   Circadian rhythm sleep disorder, shift work type 02/02/2017   Acne vulgaris 02/02/2017   Occlusion and stenosis of bilateral carotid arteries 02/02/2017   Neoplasm of uncertain behavior of skin 02/02/2017   Morbid obesity (HCC) 07/07/2016   Atypical chest pain 06/08/2016   Mass of jaw 05/22/2014   Retaining fluid 11/08/2013   Urinary incontinence 03/14/2013   Depression 01/13/2013   Type 2 diabetes mellitus (HCC) 09/14/2012   Nonischemic cardiomyopathy (HCC)    Mixed hyperlipidemia 05/28/2012   Chronic pain syndrome 03/30/2012   HTN (hypertension) 03/30/2012    1. OSA (obstructive sleep apnea) The patient does tolerate PAP and reports  benefit from PAP use. The patient was reminded how to clean equipment and advised to replace supplies routinely. The patient was also counselled on weight loss. The compliance is fair. The AHI is 4.1.   OSA on cpap- controlled. Increase compliance with pap. CPAP continues to be medically necessary to treat this patient's OSA. F/u one year.    2. CPAP use counseling CPAP Counseling: had a lengthy discussion with the patient regarding the importance of PAP therapy in management of the sleep apnea. Patient appears to understand the risk factor  reduction and also understands the risks associated with untreated sleep apnea. Patient will try to make a good faith effort to remain compliant with therapy. Also instructed the patient on proper cleaning of the device including the water must be changed daily if possible and use of distilled water is preferred. Patient understands that the machine should be regularly cleaned with appropriate recommended cleaning solutions that do not damage the PAP machine for example given white vinegar and water rinses. Other methods such as ozone treatment may not be as good as these simple methods to achieve cleaning.   3. Essential hypertension Hypertension Counseling:   The following  hypertensive lifestyle modification were recommended and discussed:  1. Limiting alcohol intake to less than 1 oz/day of ethanol:(24 oz of beer or 8 oz of wine or 2 oz of 100-proof whiskey). 2. Take baby ASA 81 mg daily. 3. Importance of regular aerobic exercise and losing weight. 4. Reduce dietary saturated fat and cholesterol intake for overall cardiovascular health. 5. Maintaining adequate dietary potassium, calcium, and magnesium intake. 6. Regular monitoring of the blood pressure. 7. Reduce sodium intake to less than 100 mmol/day (less than 2.3 gm of sodium or less than 6 gm of sodium choride)    4. Insufficient sleep syndrome Encouraged to sleep longer and take naps as needed.     General Counseling: I have discussed the findings of the evaluation and examination with Marcy Salvo.  I have also discussed any further diagnostic evaluation thatmay be needed or ordered today. Jasmon verbalizes understanding of the findings of todays visit. We also reviewed his medications today and discussed drug interactions and side effects including but not limited excessive drowsiness and altered mental states. We also discussed that there is always a risk not just to him but also people around him. he has been encouraged to call the office with any questions or concerns that should arise related to todays visit.  No orders of the defined types were placed in this encounter.       I have personally obtained a history, examined the patient, evaluated laboratory and imaging results, formulated the assessment and plan and placed orders. This patient was seen today by Emmaline Kluver, PA-C in collaboration with Dr. Freda Munro.   Yevonne Pax, MD [facility] Diplomate ABMS Pulmonary Critical Care Medicine and Sleep Medicine

## 2022-08-18 NOTE — Patient Instructions (Signed)

## 2022-08-28 ENCOUNTER — Encounter: Payer: 59 | Admitting: Physician Assistant

## 2022-09-01 ENCOUNTER — Encounter: Payer: Self-pay | Admitting: Physician Assistant

## 2022-09-01 ENCOUNTER — Ambulatory Visit: Payer: 59 | Attending: Cardiology | Admitting: Cardiology

## 2022-09-01 ENCOUNTER — Ambulatory Visit (INDEPENDENT_AMBULATORY_CARE_PROVIDER_SITE_OTHER): Payer: 59 | Admitting: Physician Assistant

## 2022-09-01 VITALS — BP 125/80 | HR 62 | Temp 98.4°F | Resp 16 | Ht 74.0 in | Wt 329.8 lb

## 2022-09-01 DIAGNOSIS — E1165 Type 2 diabetes mellitus with hyperglycemia: Secondary | ICD-10-CM | POA: Diagnosis not present

## 2022-09-01 DIAGNOSIS — R5383 Other fatigue: Secondary | ICD-10-CM

## 2022-09-01 DIAGNOSIS — Z6841 Body Mass Index (BMI) 40.0 and over, adult: Secondary | ICD-10-CM

## 2022-09-01 DIAGNOSIS — F411 Generalized anxiety disorder: Secondary | ICD-10-CM | POA: Diagnosis not present

## 2022-09-01 DIAGNOSIS — E538 Deficiency of other specified B group vitamins: Secondary | ICD-10-CM

## 2022-09-01 DIAGNOSIS — I10 Essential (primary) hypertension: Secondary | ICD-10-CM

## 2022-09-01 DIAGNOSIS — E559 Vitamin D deficiency, unspecified: Secondary | ICD-10-CM

## 2022-09-01 DIAGNOSIS — R946 Abnormal results of thyroid function studies: Secondary | ICD-10-CM

## 2022-09-01 DIAGNOSIS — Z0001 Encounter for general adult medical examination with abnormal findings: Secondary | ICD-10-CM

## 2022-09-01 DIAGNOSIS — F331 Major depressive disorder, recurrent, moderate: Secondary | ICD-10-CM | POA: Diagnosis not present

## 2022-09-01 DIAGNOSIS — E782 Mixed hyperlipidemia: Secondary | ICD-10-CM

## 2022-09-01 DIAGNOSIS — R300 Dysuria: Secondary | ICD-10-CM

## 2022-09-01 LAB — POCT GLYCOSYLATED HEMOGLOBIN (HGB A1C): Hemoglobin A1C: 9.5 % — AB (ref 4.0–5.6)

## 2022-09-01 MED ORDER — SEMAGLUTIDE(0.25 OR 0.5MG/DOS) 2 MG/3ML ~~LOC~~ SOPN
0.2500 mg | PEN_INJECTOR | SUBCUTANEOUS | 0 refills | Status: DC
Start: 2022-09-01 — End: 2022-09-29

## 2022-09-01 MED ORDER — HYDROXYZINE PAMOATE 25 MG PO CAPS
25.0000 mg | ORAL_CAPSULE | Freq: Three times a day (TID) | ORAL | 0 refills | Status: DC | PRN
Start: 2022-09-01 — End: 2022-11-12

## 2022-09-01 NOTE — Progress Notes (Signed)
[facility] [address] [place], [place] [address]  Internal MEDICINE  Office Visit Note  Patient Name: Gerald [name]  [id number]  [id number]  Date of Service: 09/11/2022  Chief Complaint  Patient presents with   Annual Exam   Depression    Work is causing increased depression symptoms for the last 2-3 months.   Gastroesophageal Reflux   Hypertension   Hyperlipidemia   Quality Metric Gaps    Shingles     HPI Pt is here for routine health maintenance examination -trulicity backordered for awhile and has been gaining weight, would like to switch to ozrmpic to help A1c and weight -Been more depressed and anxious. Will send hydroxyzine as needed and check on psych referral. If too drowsy on this may consider adding something like buspar instead until psych takes over. Had S/E on wellbutrin -reduced sensation in feet and attributes this to weight gain and sugars rising again -doing well on new cpap -will go to pharmacy for shingles vaccines, due for eye exam and will schedule. UTD on colon screening -due for labs  Current Medication: Outpatient Encounter Medications as of 09/01/2022  Medication Sig   citalopram (CELEXA) 40 MG tablet TAKE 1 TABLET BY MOUTH EVERY DAY   furosemide (LASIX) 20 MG tablet TAKE 1 TABLET BY MOUTH EVERY DAY AS NEEDED FOR LEG SWELLING   gabapentin (NEURONTIN) 300 MG capsule Take 1 capsule (300 mg total) by mouth 3 (three) times daily.   hydrOXYzine (VISTARIL) 25 MG capsule Take 1 capsule (25 mg total) by mouth every 8 (eight) hours as needed.   lisinopril-hydrochlorothiazide (ZESTORETIC) 20-25 MG tablet Take 1 tablet by mouth daily.   Multiple Vitamins-Minerals (MULTIVITAMIN ADULTS PO) Take 1 tablet by mouth daily.   nitroGLYCERIN (NITROSTAT) 0.4 MG SL tablet Place 1 tablet (0.4 mg total) under the tongue every 5 (five) minutes x 3 doses as needed for chest pain.   Semaglutide,0.25 or 0.5MG /DOS, 2 MG/3ML SOPN Inject 0.25 mg into the skin  once a week.   Testosterone 20.25 MG/ACT (1.62%) GEL Place 40.5 mg onto the skin daily. This would be two pumps daily   [DISCONTINUED] Dulaglutide (TRULICITY) 3 MG/0.5ML SOPN Inject 3 mg as directed once a week.   No facility-administered encounter medications on file as of 09/01/2022.    Surgical History: Past Surgical History:  Procedure Laterality Date   BONE EXCISION Right 11/21/2020   Procedure: PART EXCISION BONE- PHALANX;  Surgeon: Gwyneth Revels, DPM;  Location: [facility];  Service: Podiatry;  Laterality: Right;   CARDIAC CATHETERIZATION N/A 09/2011   [facility]; EF 50% with 30% mid LAD stenosis and no obstructive disease.   CARDIAC CATHETERIZATION  09/2010   [facility]; Mid LAD 40% stenosis; Mid Circumflex:Normal; Mid RCA; Normal   CARDIAC CATHETERIZATION     COLONOSCOPY     COLONOSCOPY WITH PROPOFOL N/A 04/10/2021   Procedure: COLONOSCOPY WITH PROPOFOL;  Surgeon: Wyline Mood, MD;  Location: [facility] ENDOSCOPY;  Service: Gastroenterology;  Laterality: N/A;   ESOPHAGOGASTRODUODENOSCOPY N/A 04/10/2021   Procedure: ESOPHAGOGASTRODUODENOSCOPY (EGD);  Surgeon: Wyline Mood, MD;  Location: [facility] ENDOSCOPY;  Service: Gastroenterology;  Laterality: N/A;   ESOPHAGOGASTRODUODENOSCOPY (EGD) WITH PROPOFOL N/A 06/10/2017   Procedure: ESOPHAGOGASTRODUODENOSCOPY (EGD) WITH PROPOFOL;  Surgeon: Wyline Mood, MD;  Location: [facility] ENDOSCOPY;  Service: Gastroenterology;  Laterality: N/A;   ESOPHAGOGASTRODUODENOSCOPY (EGD) WITH PROPOFOL N/A 01/01/2022   Procedure: ESOPHAGOGASTRODUODENOSCOPY (EGD) WITH PROPOFOL;  Surgeon: Wyline Mood, MD;  Location: [facility] ENDOSCOPY;  Service: Gastroenterology;  Laterality: N/A;   ESOPHAGOGASTRODUODENOSCOPY (EGD) WITH  PROPOFOL N/A 01/06/2022   Procedure: ESOPHAGOGASTRODUODENOSCOPY (EGD) WITH PROPOFOL;  Surgeon: Wyline Mood, MD;  Location: [facility] ENDOSCOPY;  Service: Gastroenterology;  Laterality: N/A;   HAMMER TOE SURGERY Right 11/21/2020   Procedure: HAMMERTOE CORRECTION;  Surgeon: Gwyneth Revels, DPM;  Location: [facility];  Service: Podiatry;  Laterality: Right;   INSERTION OF MESH  04/16/2021   Procedure: INSERTION OF MESH;  Surgeon: Henrene Dodge, MD;  Location: [facility] ORS;  Service: General;;   KNEE ARTHROSCOPY WITH MEDIAL MENISECTOMY Right 09/16/2012   Procedure: RIGHT KNEE ARTHROSCOPY WITH MEDIAL AND LATERAL MENISECTOMY, CHONDROPLASTY;  Surgeon: Loreta Ave, MD;  Location: [facility];  Service: Orthopedics;  Laterality: Right;  RIGHT KNEE SCOPE MEDIAL MENISCECTOMY   LEFT HEART CATH AND CORONARY ANGIOGRAPHY N/A 06/11/2016   Procedure: Left Heart Cath and Coronary Angiography;  Surgeon: Antonieta Iba, MD;  Location: [facility] INVASIVE CV LAB;  Service: Cardiovascular;  Laterality: N/A;   UMBILICAL HERNIA REPAIR N/A 04/16/2021   Procedure: HERNIA REPAIR UMBILICAL ADULT, open;  Surgeon: Henrene Dodge, MD;  Location: [facility] ORS;  Service: General;  Laterality: N/A;    Medical History: Past Medical History:  Diagnosis Date   Abnormal LFTs    Anxiety    Atypical chest pain    Borderline diabetes    CAD (coronary artery disease)    a.) PCI and stent placement (unknown type) to mLAD and mLCx; date unknown. b.) LHC 09/10/2010: 40% mLAD; no intervention. c.) LHC 09/12/2011: EF 60%; 30% ISR mLAD, 40% dLAD, 30% pLCx, 30% ISR mLCx, 40% mRCA; no intervention. d.) LHC 05/23/2014: EF >55%; 30% p-mLAD; no intervention. e.) LHC 06/11/2016: EF 55-65%; LVEDP norm; 30-40% p-mLAD; no intervention.   Carcinoma (HCC)    Chronic pain syndrome    Depression    Fundic gland polyps of stomach, benign    Gastritis    GERD (gastroesophageal reflux disease)    Hepatic steatosis    Hyperlipidemia    Hypertension    Migraines    Myocardial infarction [facility]) 2008   was told by PCP   Nonischemic cardiomyopathy (HCC)    a.) TTE 09/12/2011: EF 35-45%. b.) LHC 09/12/2011: EF 60%. c.) TTE 06/04/2012: EF 55-60%. d.) LHC 05/23/2014: EF >55%. e.) TTE 06/11/2016: EF 55-60%; G1DD. f.) LHC  06/11/2016: EF 55-65%. g.) TTE 01/08/2018: EF 60-65%.   Obesity    Occlusion and stenosis of bilateral carotid arteries    OSA on CPAP    Osteoarthritis of both knees    Shortness of breath    Squamous cell carcinoma of skin 02/27/2020   left distal lat deltoid - [facility]    Family History: Family History  Problem Relation Age of Onset   Heart disease Father 40       MI   Heart attack Father    Stomach cancer Father    Hypertension Mother    Breast cancer Mother       Review of Systems  Constitutional:  Positive for fatigue. Negative for chills and unexpected weight change.  HENT:  Negative for congestion, postnasal drip, rhinorrhea, sneezing and sore throat.   Eyes:  Negative for redness.  Respiratory:  Negative for cough, chest tightness and shortness of breath.   Cardiovascular:  Negative for chest pain and palpitations.  Gastrointestinal:  Negative for abdominal pain, anal bleeding, constipation, diarrhea, nausea and vomiting.  Genitourinary:  Negative for dysuria and frequency.  Musculoskeletal:  Positive for arthralgias. Negative for back pain, joint swelling and neck pain.  Skin:  Negative for rash.  Neurological:  Positive for numbness. Negative for tremors.  Hematological:  Negative for adenopathy. Does not bruise/bleed easily.  Psychiatric/Behavioral:  Positive for behavioral problems (Depression) and sleep disturbance. Negative for self-injury and suicidal ideas. The patient is nervous/anxious.      Vital Signs: BP 125/80   Pulse 62   Temp 98.4 F (36.9 C)   Resp 16   Ht 6\' 2"  (1.88 m)   Wt (!) 329 lb 12.8 oz (149.6 kg)   SpO2 95%   BMI 42.34 kg/m    Physical Exam Vitals and nursing note reviewed.  Constitutional:      General: He is not in acute distress.    Appearance: Normal appearance. He is well-developed. He is obese. He is not diaphoretic.  HENT:     Head: Normocephalic and atraumatic.     Mouth/Throat:     Pharynx: No oropharyngeal exudate.   Eyes:     Pupils: Pupils are equal, round, and reactive to light.  Neck:     Thyroid: No thyromegaly.     Vascular: No JVD.     Trachea: No tracheal deviation.  Cardiovascular:     Rate and Rhythm: Normal rate and regular rhythm.     Heart sounds: Normal heart sounds. No murmur heard.    No friction rub. No gallop.  Pulmonary:     Effort: Pulmonary effort is normal. No respiratory distress.     Breath sounds: No wheezing or rales.  Chest:     Chest wall: No tenderness.  Abdominal:     General: Bowel sounds are normal.     Palpations: Abdomen is soft.     Tenderness: There is no abdominal tenderness.  Musculoskeletal:        General: Normal range of motion.     Cervical back: Normal range of motion and neck supple.     Right lower leg: No edema.     Left lower leg: No edema.  Feet:     Right foot:     Protective Sensation: 4 sites tested.  2 sites sensed.     Skin integrity: Skin integrity normal.     Toenail Condition: Right toenails are normal.     Left foot:     Protective Sensation: 4 sites tested.  2 sites sensed.     Skin integrity: Skin integrity normal.     Toenail Condition: Left toenails are normal.  Lymphadenopathy:     Cervical: No cervical adenopathy.  Skin:    General: Skin is warm and dry.  Neurological:     Mental Status: He is alert and oriented to person, place, and time.     Cranial Nerves: No cranial nerve deficit.  Psychiatric:        Thought Content: Thought content normal.        Judgment: Judgment normal.     Comments: Depressed affect       LABS: Recent Results (from the past 2160 hour(s))  POCT HgB A1C     Status: Abnormal   Collection Time: 09/01/22 11:03 AM  Result Value Ref Range   Hemoglobin A1C 9.5 (A) 4.0 - 5.6 %   HbA1c POC (<> result, manual entry)     HbA1c, POC (prediabetic range)     HbA1c, POC (controlled diabetic range)    UA/M w/rflx Culture, Routine     Status: Abnormal   Collection Time: 09/01/22 11:05 AM    Specimen: Urine   Urine  Result Value Ref Range  Specific Gravity, UA 1.022 1.005 - 1.030   pH, UA 5.5 5.0 - 7.5   Color, UA Yellow Yellow   Appearance Ur Turbid (A) Clear   Leukocytes,UA Negative Negative   Protein,UA Negative Negative/Trace   Glucose, UA 1+ (A) Negative   Ketones, UA Negative Negative   RBC, UA Negative Negative   Bilirubin, UA Negative Negative   Urobilinogen, Ur 1.0 0.2 - 1.0 mg/dL   Nitrite, UA Negative Negative   Microscopic Examination Comment     Comment: Microscopic follows if indicated.   Microscopic Examination See below:     Comment: Microscopic was indicated and was performed.   Urinalysis Reflex Comment     Comment: This specimen will not reflex to a Urine Culture.  Microscopic Examination     Status: None   Collection Time: 09/01/22 11:05 AM   Urine  Result Value Ref Range   WBC, UA 0-5 0 - 5 /hpf   RBC, Urine None seen 0 - 2 /hpf   Epithelial Cells (non renal) 0-10 0 - 10 /hpf   Casts None seen None seen /lpf   Bacteria, UA None seen None seen/Few  CBC w/Diff/Platelet     Status: None   Collection Time: 09/08/22 10:30 AM  Result Value Ref Range   WBC 6.6 3.4 - 10.8 x10E3/uL   RBC 4.71 4.14 - 5.80 x10E6/uL   Hemoglobin 13.9 13.0 - 17.7 g/dL   Hematocrit 16.1 09.6 - 51.0 %   MCV 87 79 - 97 fL   MCH 29.5 26.6 - 33.0 pg   MCHC 33.8 31.5 - 35.7 g/dL   RDW 04.5 40.9 - 81.1 %   Platelets 220 150 - 450 x10E3/uL   Neutrophils 50 Not Estab. %   Lymphs 39 Not Estab. %   Monocytes 6 Not Estab. %   Eos 2 Not Estab. %   Basos 1 Not Estab. %   Neutrophils Absolute 3.4 1.4 - 7.0 x10E3/uL   Lymphocytes Absolute 2.6 0.7 - 3.1 x10E3/uL   Monocytes Absolute 0.4 0.1 - 0.9 x10E3/uL   EOS (ABSOLUTE) 0.1 0.0 - 0.4 x10E3/uL   Basophils Absolute 0.0 0.0 - 0.2 x10E3/uL   Immature Granulocytes 2 Not Estab. %   Immature Grans (Abs) 0.1 0.0 - 0.1 x10E3/uL  Comprehensive metabolic panel     Status: Abnormal   Collection Time: 09/08/22 10:30 AM  Result Value  Ref Range   Glucose 273 (H) 70 - 99 mg/dL   BUN 14 6 - 24 mg/dL   Creatinine, Ser 9.14 0.76 - 1.27 mg/dL   eGFR 782 >95 AO/ZHY/8.65   BUN/Creatinine Ratio 15 9 - 20   Sodium 137 134 - 144 mmol/L   Potassium 4.1 3.5 - 5.2 mmol/L   Chloride 100 96 - 106 mmol/L   CO2 26 20 - 29 mmol/L   Calcium 9.2 8.7 - 10.2 mg/dL   Total Protein 6.5 6.0 - 8.5 g/dL   Albumin 4.0 3.8 - 4.9 g/dL   Globulin, Total 2.5 1.5 - 4.5 g/dL   Bilirubin Total 0.4 0.0 - 1.2 mg/dL   Alkaline Phosphatase 115 44 - 121 IU/L   AST 25 0 - 40 IU/L   ALT 45 (H) 0 - 44 IU/L  Lipid Panel With LDL/HDL Ratio     Status: Abnormal   Collection Time: 09/08/22 10:30 AM  Result Value Ref Range   Cholesterol, Total 214 (H) 100 - 199 mg/dL   Triglycerides 784 (H) 0 - 149 mg/dL   HDL 32 (  L) >39 mg/dL   VLDL Cholesterol Cal 62 (H) 5 - 40 mg/dL   LDL Chol Calc (NIH) 098 (H) 0 - 99 mg/dL   LDL/HDL Ratio 3.8 (H) 0.0 - 3.6 ratio    Comment:                                     LDL/HDL Ratio                                             Men  Women                               1/2 Avg.Risk  1.0    1.5                                   Avg.Risk  3.6    3.2                                2X Avg.Risk  6.2    5.0                                3X Avg.Risk  8.0    6.1   TSH + free T4     Status: None   Collection Time: 09/08/22 10:30 AM  Result Value Ref Range   TSH 3.030 0.450 - 4.500 uIU/mL   Free T4 1.10 0.82 - 1.77 ng/dL  VITAMIN D 25 Hydroxy (Vit-D Deficiency, Fractures)     Status: Abnormal   Collection Time: 09/08/22 10:30 AM  Result Value Ref Range   Vit D, 25-Hydroxy 22.3 (L) 30.0 - 100.0 ng/mL    Comment: Vitamin D deficiency has been defined by the Institute of Medicine and an Endocrine Society practice guideline as a level of serum 25-OH vitamin D less than 20 ng/mL (1,2). The Endocrine Society went on to further define vitamin D insufficiency as a level between 21 and 29 ng/mL (2). 1. IOM (Institute of Medicine). 2010.  Dietary reference    intakes for calcium and D. [place]: The    [employer]. 2. Holick MF, Binkley Maitland, Bischoff-Ferrari HA, et al.    Evaluation, treatment, and prevention of vitamin D    deficiency: an Endocrine Society clinical practice    guideline. JCEM. 2011 Jul; 96(7):1911-30.   B12 and Folate Panel     Status: None   Collection Time: 09/08/22 10:30 AM  Result Value Ref Range   Vitamin B-12 384 232 - 1,245 pg/mL   Folate 7.9 >3.0 ng/mL    Comment: A serum folate concentration of less than 3.1 ng/mL is considered to represent clinical deficiency.        Assessment/Plan: 1. Encounter for general adult medical examination with abnormal findings CPE performed, due for eye exam and shingles vaccines  2. Type 2 diabetes mellitus with hyperglycemia, unspecified whether long term insulin use (HCC) - POCT HgB A1C is 9.5 which is up from 6.1 last check and will swtich to ozempic due to trulicity backorder - Semaglutide,0.25  or 0.5MG /DOS, 2 MG/3ML SOPN; Inject 0.25 mg into the skin once a week.  Dispense: 3 mL; Refill: 0  3. Essential hypertension Stable, continue current medication  4. Moderate episode of recurrent major depressive disorder (HCC) Will follow up on psych referral  5. Generalized anxiety disorder Will follow up on psych referral, will add hydroxyzine prn--advised on possible drowsiness S/E  6. Mixed hyperlipidemia - Lipid Panel With LDL/HDL Ratio  7. Other fatigue - CBC w/Diff/Platelet - Comprehensive metabolic panel - Lipid Panel With LDL/HDL Ratio - TSH + free T4 - VITAMIN D 25 Hydroxy (Vit-D Deficiency, Fractures) - B12 and Folate Panel  8. Abnormal thyroid exam - TSH + free T4  9. B12 deficiency - B12 and Folate Panel  10. Vitamin D deficiency - VITAMIN D 25 Hydroxy (Vit-D Deficiency, Fractures)  11. Dysuria - UA/M w/rflx Culture, Routine - Microscopic Examination  12. Morbid obesity with BMI of 40.0-44.9, adult  (HCC) Will continue to work on diet and exercise and will start on ozempic to help sugar and wt   General Counseling: Travys verbalizes understanding of the findings of todays visit and agrees with plan of treatment. I have discussed any further diagnostic evaluation that may be needed or ordered today. We also reviewed his medications today. he has been encouraged to call the office with any questions or concerns that should arise related to todays visit.    Counseling:    Orders Placed This Encounter  Procedures   Microscopic Examination   UA/M w/rflx Culture, Routine   CBC w/Diff/Platelet   Comprehensive metabolic panel   Lipid Panel With LDL/HDL Ratio   TSH + free T4   VITAMIN D 25 Hydroxy (Vit-D Deficiency, Fractures)   B12 and Folate Panel   POCT HgB A1C    Meds ordered this encounter  Medications   Semaglutide,0.25 or 0.5MG /DOS, 2 MG/3ML SOPN    Sig: Inject 0.25 mg into the skin once a week.    Dispense:  3 mL    Refill:  0   hydrOXYzine (VISTARIL) 25 MG capsule    Sig: Take 1 capsule (25 mg total) by mouth every 8 (eight) hours as needed.    Dispense:  30 capsule    Refill:  0    This patient was seen by Lynn Ito, PA-C in collaboration with Dr. Beverely Risen as a part of collaborative care agreement.  Total time spent:35 Minutes  Time spent includes review of chart, medications, test results, and follow up plan with the patient.     Lyndon Code, MD  Internal Medicine

## 2022-09-02 ENCOUNTER — Encounter: Payer: Self-pay | Admitting: Cardiology

## 2022-09-29 ENCOUNTER — Other Ambulatory Visit: Payer: Self-pay

## 2022-09-29 ENCOUNTER — Ambulatory Visit: Payer: 59 | Admitting: Physician Assistant

## 2022-09-29 ENCOUNTER — Telehealth: Payer: Self-pay

## 2022-09-29 DIAGNOSIS — E1165 Type 2 diabetes mellitus with hyperglycemia: Secondary | ICD-10-CM

## 2022-09-29 MED ORDER — SEMAGLUTIDE(0.25 OR 0.5MG/DOS) 2 MG/3ML ~~LOC~~ SOPN
0.5000 mg | PEN_INJECTOR | SUBCUTANEOUS | 0 refills | Status: DC
Start: 2022-09-29 — End: 2023-04-13

## 2022-09-29 NOTE — Telephone Encounter (Signed)
Patient was not aware that he had a follow-up appointment today. Per Leotis Shames, I refilled Ozempic and patient will increase to 0.5mg  today.

## 2022-10-16 ENCOUNTER — Ambulatory Visit: Payer: 59 | Admitting: Physician Assistant

## 2022-10-17 ENCOUNTER — Telehealth: Payer: Self-pay | Admitting: Internal Medicine

## 2022-10-17 NOTE — Telephone Encounter (Signed)
Called and lvm to pt regarding missed appt on 10/16/22

## 2022-11-10 ENCOUNTER — Emergency Department: Payer: Self-pay

## 2022-11-10 ENCOUNTER — Inpatient Hospital Stay
Admission: EM | Admit: 2022-11-10 | Discharge: 2022-11-12 | DRG: 062 | Disposition: A | Discharge status: Home / Self Care | Payer: Self-pay | Attending: Internal Medicine | Admitting: Internal Medicine

## 2022-11-10 ENCOUNTER — Other Ambulatory Visit: Payer: Self-pay

## 2022-11-10 DIAGNOSIS — I10 Essential (primary) hypertension: Secondary | ICD-10-CM | POA: Diagnosis present

## 2022-11-10 DIAGNOSIS — G4733 Obstructive sleep apnea (adult) (pediatric): Secondary | ICD-10-CM | POA: Diagnosis present

## 2022-11-10 DIAGNOSIS — Z7985 Long-term (current) use of injectable non-insulin antidiabetic drugs: Secondary | ICD-10-CM

## 2022-11-10 DIAGNOSIS — M170 Bilateral primary osteoarthritis of knee: Secondary | ICD-10-CM | POA: Diagnosis present

## 2022-11-10 DIAGNOSIS — R531 Weakness: Secondary | ICD-10-CM

## 2022-11-10 DIAGNOSIS — Z803 Family history of malignant neoplasm of breast: Secondary | ICD-10-CM

## 2022-11-10 DIAGNOSIS — D72829 Elevated white blood cell count, unspecified: Secondary | ICD-10-CM | POA: Diagnosis present

## 2022-11-10 DIAGNOSIS — I639 Cerebral infarction, unspecified: Principal | ICD-10-CM | POA: Diagnosis present

## 2022-11-10 DIAGNOSIS — Z85828 Personal history of other malignant neoplasm of skin: Secondary | ICD-10-CM

## 2022-11-10 DIAGNOSIS — Z87891 Personal history of nicotine dependence: Secondary | ICD-10-CM

## 2022-11-10 DIAGNOSIS — Z800 Family history of malignant neoplasm of digestive organs: Secondary | ICD-10-CM

## 2022-11-10 DIAGNOSIS — K2970 Gastritis, unspecified, without bleeding: Secondary | ICD-10-CM | POA: Diagnosis present

## 2022-11-10 DIAGNOSIS — G894 Chronic pain syndrome: Secondary | ICD-10-CM | POA: Diagnosis present

## 2022-11-10 DIAGNOSIS — G5793 Unspecified mononeuropathy of bilateral lower limbs: Secondary | ICD-10-CM

## 2022-11-10 DIAGNOSIS — I428 Other cardiomyopathies: Secondary | ICD-10-CM | POA: Diagnosis present

## 2022-11-10 DIAGNOSIS — K219 Gastro-esophageal reflux disease without esophagitis: Secondary | ICD-10-CM | POA: Diagnosis present

## 2022-11-10 DIAGNOSIS — I2510 Atherosclerotic heart disease of native coronary artery without angina pectoris: Secondary | ICD-10-CM | POA: Diagnosis present

## 2022-11-10 DIAGNOSIS — R519 Headache, unspecified: Secondary | ICD-10-CM | POA: Diagnosis present

## 2022-11-10 DIAGNOSIS — Z955 Presence of coronary angioplasty implant and graft: Secondary | ICD-10-CM

## 2022-11-10 DIAGNOSIS — I6622 Occlusion and stenosis of left posterior cerebral artery: Secondary | ICD-10-CM | POA: Diagnosis present

## 2022-11-10 DIAGNOSIS — F419 Anxiety disorder, unspecified: Secondary | ICD-10-CM | POA: Diagnosis present

## 2022-11-10 DIAGNOSIS — K760 Fatty (change of) liver, not elsewhere classified: Secondary | ICD-10-CM | POA: Diagnosis present

## 2022-11-10 DIAGNOSIS — E66813 Obesity, class 3: Secondary | ICD-10-CM | POA: Diagnosis present

## 2022-11-10 DIAGNOSIS — I6381 Other cerebral infarction due to occlusion or stenosis of small artery: Principal | ICD-10-CM | POA: Diagnosis present

## 2022-11-10 DIAGNOSIS — Z8249 Family history of ischemic heart disease and other diseases of the circulatory system: Secondary | ICD-10-CM

## 2022-11-10 DIAGNOSIS — Z888 Allergy status to other drugs, medicaments and biological substances status: Secondary | ICD-10-CM

## 2022-11-10 DIAGNOSIS — I6523 Occlusion and stenosis of bilateral carotid arteries: Secondary | ICD-10-CM | POA: Diagnosis present

## 2022-11-10 DIAGNOSIS — R29704 NIHSS score 4: Secondary | ICD-10-CM | POA: Diagnosis present

## 2022-11-10 DIAGNOSIS — E785 Hyperlipidemia, unspecified: Secondary | ICD-10-CM | POA: Diagnosis present

## 2022-11-10 DIAGNOSIS — Z79899 Other long term (current) drug therapy: Secondary | ICD-10-CM

## 2022-11-10 DIAGNOSIS — I252 Old myocardial infarction: Secondary | ICD-10-CM

## 2022-11-10 DIAGNOSIS — E876 Hypokalemia: Secondary | ICD-10-CM | POA: Diagnosis present

## 2022-11-10 DIAGNOSIS — E1142 Type 2 diabetes mellitus with diabetic polyneuropathy: Secondary | ICD-10-CM | POA: Diagnosis present

## 2022-11-10 DIAGNOSIS — Z6841 Body Mass Index (BMI) 40.0 and over, adult: Secondary | ICD-10-CM

## 2022-11-10 DIAGNOSIS — G8194 Hemiplegia, unspecified affecting left nondominant side: Secondary | ICD-10-CM | POA: Diagnosis present

## 2022-11-10 DIAGNOSIS — F32A Depression, unspecified: Secondary | ICD-10-CM | POA: Diagnosis present

## 2022-11-10 LAB — DIFFERENTIAL
Abs Immature Granulocytes: 0.18 10*3/uL — ABNORMAL HIGH (ref 0.00–0.07)
Basophils Absolute: 0.1 10*3/uL (ref 0.0–0.1)
Basophils Relative: 1 %
Eosinophils Absolute: 0.1 10*3/uL (ref 0.0–0.5)
Eosinophils Relative: 1 %
Immature Granulocytes: 2 %
Lymphocytes Relative: 42 %
Lymphs Abs: 4.5 10*3/uL — ABNORMAL HIGH (ref 0.7–4.0)
Monocytes Absolute: 0.6 10*3/uL (ref 0.1–1.0)
Monocytes Relative: 6 %
Neutro Abs: 5.2 10*3/uL (ref 1.7–7.7)
Neutrophils Relative %: 48 %

## 2022-11-10 LAB — CBC
HCT: 42.5 % (ref 39.0–52.0)
Hemoglobin: 14.9 g/dL (ref 13.0–17.0)
MCH: 29.8 pg (ref 26.0–34.0)
MCHC: 35.1 g/dL (ref 30.0–36.0)
MCV: 85 fL (ref 80.0–100.0)
Platelets: 246 10*3/uL (ref 150–400)
RBC: 5 MIL/uL (ref 4.22–5.81)
RDW: 13 % (ref 11.5–15.5)
WBC: 10.6 10*3/uL — ABNORMAL HIGH (ref 4.0–10.5)
nRBC: 0 % (ref 0.0–0.2)

## 2022-11-10 LAB — COMPREHENSIVE METABOLIC PANEL
ALT: 52 U/L — ABNORMAL HIGH (ref 0–44)
AST: 38 U/L (ref 15–41)
Albumin: 3.9 g/dL (ref 3.5–5.0)
Alkaline Phosphatase: 87 U/L (ref 38–126)
Anion gap: 8 (ref 5–15)
BUN: 13 mg/dL (ref 6–20)
CO2: 24 mmol/L (ref 22–32)
Calcium: 9 mg/dL (ref 8.9–10.3)
Chloride: 103 mmol/L (ref 98–111)
Creatinine, Ser: 0.88 mg/dL (ref 0.61–1.24)
GFR, Estimated: >60 mL/min (ref >60)
Glucose, Bld: 147 mg/dL — ABNORMAL HIGH (ref 70–99)
Potassium: 3.7 mmol/L (ref 3.5–5.1)
Sodium: 135 mmol/L (ref 135–145)
Total Bilirubin: 1 mg/dL (ref 0.3–1.2)
Total Protein: 7.4 g/dL (ref 6.5–8.1)

## 2022-11-10 LAB — APTT: aPTT: 29 s (ref 24–36)

## 2022-11-10 LAB — PROTIME-INR
INR: 1.1 (ref 0.8–1.2)
Prothrombin Time: 14 s (ref 11.4–15.2)

## 2022-11-10 LAB — GLUCOSE, CAPILLARY: Glucose-Capillary: 145 mg/dL — ABNORMAL HIGH (ref 70–99)

## 2022-11-10 LAB — ETHANOL: Alcohol, Ethyl (B): <10 mg/dL (ref <10)

## 2022-11-10 LAB — CBG MONITORING, ED: Glucose-Capillary: 146 mg/dL — ABNORMAL HIGH (ref 70–99)

## 2022-11-10 MED ORDER — METOCLOPRAMIDE HCL 5 MG/ML IJ SOLN
10.0000 mg | Freq: Once | INTRAMUSCULAR | Status: AC
  Administered 2022-11-10: 10 mg via INTRAVENOUS
  Filled 2022-11-10: qty 2

## 2022-11-10 MED ORDER — ACETAMINOPHEN 500 MG PO TABS
1000.0000 mg | ORAL_TABLET | Freq: Three times a day (TID) | ORAL | Status: DC
  Administered 2022-11-11 – 2022-11-12 (×3): 1000 mg via ORAL
  Filled 2022-11-10 (×3): qty 2

## 2022-11-10 MED ORDER — LABETALOL HCL 5 MG/ML IV SOLN
10.0000 mg | INTRAVENOUS | Status: DC | PRN
  Administered 2022-11-10: 10 mg via INTRAVENOUS
  Filled 2022-11-10: qty 4

## 2022-11-10 MED ORDER — CLEVIDIPINE BUTYRATE 0.5 MG/ML IV EMUL: 0.0000 mg/h | INTRAVENOUS | Status: DC

## 2022-11-10 MED ORDER — SENNOSIDES-DOCUSATE SODIUM 8.6-50 MG PO TABS: 1.0000 | ORAL_TABLET | Freq: Every evening | ORAL | Status: DC | PRN

## 2022-11-10 MED ORDER — LABETALOL HCL 5 MG/ML IV SOLN: 10.0000 mg | Freq: Once | INTRAVENOUS | Status: DC

## 2022-11-10 MED ORDER — LACTATED RINGERS IV SOLN: INTRAVENOUS | Status: DC

## 2022-11-10 MED ORDER — ACETAMINOPHEN 325 MG PO TABS: 650.0000 mg | ORAL_TABLET | ORAL | Status: DC | PRN

## 2022-11-10 MED ORDER — ACETAMINOPHEN 650 MG RE SUPP: 650.0000 mg | RECTAL | Status: DC | PRN

## 2022-11-10 MED ORDER — SODIUM CHLORIDE 0.9% FLUSH
3.0000 mL | Freq: Once | INTRAVENOUS | Status: AC
  Administered 2022-11-10: 3 mL via INTRAVENOUS

## 2022-11-10 MED ORDER — IOHEXOL 350 MG/ML SOLN
100.0000 mL | Freq: Once | INTRAVENOUS | Status: AC | PRN
  Administered 2022-11-10: 100 mL via INTRAVENOUS

## 2022-11-10 MED ORDER — ACETAMINOPHEN 500 MG PO TABS
1000.0000 mg | ORAL_TABLET | Freq: Once | ORAL | Status: AC
  Administered 2022-11-10: 1000 mg via ORAL
  Filled 2022-11-10: qty 2

## 2022-11-10 MED ORDER — ACETAMINOPHEN 160 MG/5ML PO SOLN: 650.0000 mg | ORAL | Status: DC | PRN

## 2022-11-10 MED ORDER — MAGNESIUM SULFATE 2 GM/50ML IV SOLN
2.0000 g | Freq: Once | INTRAVENOUS | Status: AC
  Administered 2022-11-10: 2 g via INTRAVENOUS
  Filled 2022-11-10: qty 50

## 2022-11-10 MED ORDER — STROKE: EARLY STAGES OF RECOVERY BOOK: Freq: Once | Status: AC

## 2022-11-10 MED ORDER — PANTOPRAZOLE SODIUM 40 MG IV SOLR
40.0000 mg | Freq: Every day | INTRAVENOUS | Status: DC
  Administered 2022-11-10 – 2022-11-11 (×2): 40 mg via INTRAVENOUS
  Filled 2022-11-10 (×2): qty 10

## 2022-11-10 MED ORDER — INSULIN ASPART 100 UNIT/ML IJ SOLN
0.0000 [IU] | INTRAMUSCULAR | Status: DC
  Administered 2022-11-10: 2 [IU] via SUBCUTANEOUS
  Administered 2022-11-11 (×2): 3 [IU] via SUBCUTANEOUS
  Administered 2022-11-11: 2 [IU] via SUBCUTANEOUS
  Administered 2022-11-11 – 2022-11-12 (×5): 3 [IU] via SUBCUTANEOUS
  Filled 2022-11-10 (×9): qty 1

## 2022-11-10 MED ORDER — TENECTEPLASE FOR STROKE
25.0000 mg | PACK | Freq: Once | INTRAVENOUS | Status: AC
  Administered 2022-11-10: 25 mg via INTRAVENOUS

## 2022-11-10 NOTE — Progress Notes (Signed)
   11/10/22 2318  BiPAP/CPAP/SIPAP  $ Non-Invasive Ventilator  Non-Invasive Vent Initial;Non-Invasive Vent Set Up  $ Face Mask Large  Yes  BiPAP/CPAP/SIPAP Pt Type Adult  BiPAP/CPAP/SIPAP Resmed  Mask Type Nasal mask  Mask Size Large  Respiratory Rate 16 breaths/min  EPAP 9 cmH2O (per patient home regimen)  FiO2 (%) 21 %  Patient Home Equipment No  Auto Titrate No

## 2022-11-10 NOTE — Progress Notes (Signed)
PHARMACIST CODE STROKE RESPONSE  Notified to mix TNK at 1852 by Dr. Thana Farr TNK preparation completed at 1854  TNK dose = 25 mg IV over 5 seconds  Issues/delays encountered (if applicable): patient initially refused TNK when code stroke was activated. Later, after speaking with EDP and neurologist again(as symptoms did not resolve), agreed to receive medication. Still given within 4.5 hour window of LKW.  Gerald [name] A Gerald [name] 11/10/22 8:20 PM

## 2022-11-10 NOTE — ED Provider Notes (Signed)
[facility] Provider Note    Event Date/Time   First MD Initiated Contact with Patient 11/10/22 1751     (approximate)   History   No chief complaint on file.   HPI  Gerald [name] is a 57 y.o. male with history of hypertension who comes in with concern for left-sided weakness and numbness.  Patient reports symptoms started around 2:30 PM.  He denies any chest pain, shortness of breath, abdominal pain.  He states that he is not no risk factors for bleeding.  He states he has a mild headache that has gotten a little bit worse.  Physical Exam   Triage Vital Signs: ED Triage Vitals  Encounter Vitals Group     BP      Systolic BP Percentile      Diastolic BP Percentile      Pulse      Resp      Temp      Temp src      SpO2      Weight      Height      Head Circumference      Peak Flow      Pain Score      Pain Loc      Pain Education      Exclude from Growth Chart     Most recent vital signs: Vitals:   11/10/22 1841 11/10/22 1844  BP: (!) 173/94   Pulse: 78   Resp: 13   SpO2: 99% 97%     General: Awake, no distress.  CV:  Good peripheral perfusion.  Resp:  Normal effort.  Abd:  No distention.  Soft nontender Other:  He has weakness noted in the left side and tingling noted.   ED Results / Procedures / Treatments   Labs (all labs ordered are listed, but only abnormal results are displayed) Labs Reviewed  CBG MONITORING, ED - Abnormal; Notable for the following components:      Result Value   Glucose-Capillary 146 (*)    All other components within normal limits  PROTIME-INR  APTT  CBC  DIFFERENTIAL  COMPREHENSIVE METABOLIC PANEL  ETHANOL  I-STAT CREATININE, ED  CBG MONITORING, ED     EKG  My interpretation of EKG:  Normal sinus rate of 80 without any ST elevation or T wave inversions, normal intervals does have a missing lead she will have to get a repeat  RADIOLOGY I have reviewed the CT head personally  interpreted no evidence of intracranial hemorrhage   PROCEDURES:  Critical Care performed: Yes, see critical care procedure note(s)  .1-3 Lead EKG Interpretation  Performed by: Concha Se, MD Authorized by: Concha Se, MD     Interpretation: normal     ECG rate:  70   ECG rate assessment: normal     Rhythm: sinus rhythm     Ectopy: none     Conduction: normal   .Critical Care  Performed by: Concha Se, MD Authorized by: Concha Se, MD   Critical care provider statement:    Critical care time (minutes):  30   Critical care was necessary to treat or prevent imminent or life-threatening deterioration of the following conditions:  CNS failure or compromise   Critical care was time spent personally by me on the following activities:  Development of treatment plan with patient or surrogate, discussions with consultants, evaluation of patient's response to treatment, examination of patient, ordering and review of  laboratory studies, ordering and review of radiographic studies, ordering and performing treatments and interventions, pulse oximetry, re-evaluation of patient's condition and review of old charts    MEDICATIONS ORDERED IN ED: Medications  sodium chloride flush (NS) 0.9 % injection 3 mL (has no administration in time range)     IMPRESSION / MDM / ASSESSMENT AND PLAN / ED COURSE  I reviewed the triage vital signs and the nursing notes.   Patient's presentation is most consistent with acute presentation with potential threat to life or bodily function.   Patient comes in with concerns for left-sided weakness and numbness with inability to ambulate.  Initially had declined TNK.  This concerning for ischemic stroke CT head without evidence of intracranial hemorrhage no chest pain to suggest dissection.  6:50 PM when I initially saw patient he initially declined at TNK however upon further discussion with myself he states that he is now willing to do TNK.  Discussed  with him the risk of TNK and he states that he would hope that it would help resolve his ability to walk because he states that his leg is so weak he cannot even walk.  I immediately called neurology Dr. Thad Ranger, reevaluate and had pharmacy come back into the room  CBC shows slightly elevated white count.  Coags normal.  CMP reassuring.  No LVO on CTA so okay to admit here.  Discussed with Micheline Rough from ICU for admission.  Chest wall admitting patient to the ICU patient did report worsening headache.  To note this headache was not there prior to TNK administration but he did report that it was getting worse so repeat CT head was done about 45 minutes to an hour after TNK and was negative  The patient is on the cardiac monitor to evaluate for evidence of arrhythmia and/or significant heart rate changes.      FINAL CLINICAL IMPRESSION(S) / ED DIAGNOSES   Final diagnoses:  Ischemic stroke (HCC)  Left-sided weakness     Rx / DC Orders   ED Discharge Orders     None        Note:  This document was prepared using Dragon voice recognition software and may include unintentional dictation errors.   Concha Se, MD 11/10/22 2036

## 2022-11-10 NOTE — Consult Note (Signed)
[facility] TeleNeurology Consult Services  Stat Consult  Patient Name:   Gerald [name], Gerald [name] Date of Birth:   03-Oct-1965 Identification Number:   MRN - [id number] Date of Service:   11/10/2022 21:28:17  Diagnosis:       R51.9 - Headache, unspecified  Impression 26M, hx of htn, hld, anxiety, OSA, t2dm, peripheral neuropathy, who presents with sensorimotor changes on the left side, now s/p TNK. Neurology consulted for headache post- TNK.  He reports he had a headache starting when EMS picked him up today, it has since progressed and now is an 8/10. He describes bitemporal, throbbing, no exacerbating or relieving factors, no nausea/vomiting, no visual changes. He denies a migraine history, reports his last headache was 5-6 months ago.  Of note, he did have MR imaging 2019 for similarly-described headache and left-sided sensorimotor deficits, which was normal.  His deficits have not improved post TNK.   On exam currently he has evidence of effort-dependent weakness on the left upper and lower, though antigravity, with midline splitting dense sensory decrement. Repeat NCHCT normal.  There is some considerable functional overlay to his exam, though MRI will help to clarify if any underlying structural disease. Suspect headache is migrainous, no evidence of post-TNK hemorrhage.  RECS: - For headache:    - APAP 1g q6hr    - 2mg  MgSu    - 10mg  metoclopramide    - can trial VPA 500mg  x1 - Please avoid opiates, butalbital - MRI brain - Post-TNK management       Recommendations: Our recommendations are outlined below.  Dispositions : Neurology will follow   ----------------------------------------------------------------------------------------------------    Metrics: [facility] Notification Time: 11/10/2022 21:28:17 Stamp Time: 11/10/2022 21:28:17 Callback Response Time: 11/10/2022 21:28:30  Primary Provider Notified of Diagnostic Impression and  Management Plan on: 11/10/2022 22:10:20    Imaging CTA head/neck: no significant extracranial stenosis, moderate left PCA right P2 stenosis.   ----------------------------------------------------------------------------------------------------  Chief Complaint: Headache  History of Present Illness: Patient is a 57 year old Male. 26M, hx of htn, hld, anxiety, OSA, t2dm, peripheral neuropathy, who presents with sensorimotor changes on the left side, now s/p TNK. Neurology consulted for headache post- TNK.  He reports he had a headache starting when EMS picked him up today, it has since progressed and now is an 8/10. He describes bitemporal, throbbing, no exacerbating or relieving factors, no nausea/vomiting, no visual changes. He denies a migraine history, reports his last headache was 5-6 months ago.  Of note, he did have MR imaging 2019 for headache and left-sided sensorimotor deficits, which was normal.     Past Medical History:      Hypertension      Diabetes Mellitus  Medications:  No Anticoagulant use  No Antiplatelet use Reviewed EMR for current medications  Allergies:  Reviewed  Social History: Smoking: No Alcohol Use: No  Family History:  There is no family history of premature cerebrovascular disease pertinent to this consultation  ROS : 14 Points Review of Systems was performed and was negative except mentioned in HPI.  Past Surgical History: There Is No Surgical History Contributory To Today's Visit    Examination: BP(170/79), Pulse(78), 1A: Level of Consciousness - Alert; keenly responsive + 0 1B: Ask Month and Age - Both Questions Right + 0 1C: Blink Eyes & Squeeze Hands - Performs Both Tasks + 0 2: Test Horizontal Extraocular Movements - Normal + 0 3: Test Visual Fields - No Visual Loss + 0 4: Test Facial Palsy (Use Grimace if Obtunded) -  Normal symmetry + 0 5A: Test Left Arm Motor Drift - Drift, but doesn't hit bed + 1 5B: Test Right Arm Motor  Drift - No Drift for 10 Seconds + 0 6A: Test Left Leg Motor Drift - Drift, but doesn't hit bed + 1 6B: Test Right Leg Motor Drift - No Drift for 5 Seconds + 0 7: Test Limb Ataxia (FNF/Heel-Shin) - No Ataxia + 0 8: Test Sensation - Complete Loss: Cannot Sense Being Touched At All + 2 9: Test Language/Aphasia - Normal; No aphasia + 0 10: Test Dysarthria - Normal + 0 11: Test Extinction/Inattention - No abnormality + 0  NIHSS Score: 4 NIHSS Free Text : There is some functional overlay to his exam - he splits the midline with a dense hemisensory loss,  Spoke with : Cheryll Cockayne, ICU ARNP.    This consult was conducted in real time using interactive audio and Immunologist. Patient was informed of the technology being used for this visit and agreed to proceed. Patient located in hospital and provider located at home/office setting.  Patient is being evaluated for possible acute neurologic impairment and high probability of imminent or life - threatening deterioration.I spent total of 35 minutes providing care to this patient, including time for face to face visit via telemedicine, review of medical records, imaging studies and discussion of findings with providers, the patient and / or family.   Dr Lance Coon   [facility] For Inpatient follow-up with [facility] physician please call [facility] [phone]. This is not an outpatient service. Post hospital discharge, please contact hospital directly.  Please do not communicate with [facility] physicians via secure chat. If you have any questions, Please contact [facility]. Please call or reconsult our service if there are any clinical or diagnostic changes.

## 2022-11-10 NOTE — Consult Note (Addendum)
TELENEUROLOGY STROKE CONSULT (CODE STROKE)  Requesting Physician: Fuller Plan Location of Provider: [facility], [place] Location of Patient: [facility] ED Initial Page: [phone] Camera Initiation: 1751  This consult was provided via telemedicine with 2-way video and audio communication. The patient/family was informed that care would be provided in this way and agreed to receive care in this manner.     Chief Complaint: Left sided numbness and weakness  HPI: Gerald [name] is an 57 y.o. male with a history of hypertension, HLD, carotid artery stenosis, OSA and CAD who presents with complaints of left sided numbness and weakness.  Patient reports being at home and noting acute onset of left sided numbness and weakness that started in his left leg and moved upward to involve his entire left side.  Modified Rankin: 0 LSN: 11/10/22 @ 1430 TNK Given: Yes.  Patient initially declined and therefore treatment was delayed.  Administration time of 1854. Thrombectomy Candidate: No, no target lesion identified  Past Medical History:  Diagnosis Date   Abnormal LFTs    Anxiety    Atypical chest pain    Borderline diabetes    CAD (coronary artery disease)    a.) PCI and stent placement (unknown type) to mLAD and mLCx; date unknown. b.) LHC 09/10/2010: 40% mLAD; no intervention. c.) LHC 09/12/2011: EF 60%; 30% ISR mLAD, 40% dLAD, 30% pLCx, 30% ISR mLCx, 40% mRCA; no intervention. d.) LHC 05/23/2014: EF >55%; 30% p-mLAD; no intervention. e.) LHC 06/11/2016: EF 55-65%; LVEDP norm; 30-40% p-mLAD; no intervention.   Carcinoma (HCC)    Chronic pain syndrome    Depression    Fundic gland polyps of stomach, benign    Gastritis    GERD (gastroesophageal reflux disease)    Hepatic steatosis    Hyperlipidemia    Hypertension    Migraines    Myocardial infarction [facility]) 2008   was told by PCP   Nonischemic cardiomyopathy (HCC)    a.) TTE 09/12/2011: EF 35-45%. b.) LHC 09/12/2011: EF 60%. c.) TTE 06/04/2012: EF 55-60%. d.) LHC  05/23/2014: EF >55%. e.) TTE 06/11/2016: EF 55-60%; G1DD. f.) LHC 06/11/2016: EF 55-65%. g.) TTE 01/08/2018: EF 60-65%.   Obesity    Occlusion and stenosis of bilateral carotid arteries    OSA on CPAP    Osteoarthritis of both knees    Shortness of breath    Squamous cell carcinoma of skin 02/27/2020   left distal lat deltoid - [facility]    Past Surgical History:  Procedure Laterality Date   BONE EXCISION Right 11/21/2020   Procedure: PART EXCISION BONE- PHALANX;  Surgeon: Gwyneth Revels, DPM;  Location: [facility];  Service: Podiatry;  Laterality: Right;   CARDIAC CATHETERIZATION N/A 09/2011   [facility]; EF 50% with 30% mid LAD stenosis and no obstructive disease.   CARDIAC CATHETERIZATION  09/2010   [facility]; Mid LAD 40% stenosis; Mid Circumflex:Normal; Mid RCA; Normal   CARDIAC CATHETERIZATION     COLONOSCOPY     COLONOSCOPY WITH PROPOFOL N/A 04/10/2021   Procedure: COLONOSCOPY WITH PROPOFOL;  Surgeon: Wyline Mood, MD;  Location: [facility] ENDOSCOPY;  Service: Gastroenterology;  Laterality: N/A;   ESOPHAGOGASTRODUODENOSCOPY N/A 04/10/2021   Procedure: ESOPHAGOGASTRODUODENOSCOPY (EGD);  Surgeon: Wyline Mood, MD;  Location[facility];  Service: Gastroenterology;  Laterality: N/A;   ESOPHAGOGASTRODUODENOSCOPY (EGD) WITH PROPOFOL N/A 06/10/2017   Procedure: ESOPHAGOGASTRODUODENOSCOPY (EGD) WITH PROPOFOL;  Surgeon: Wyline Mood, MD;  Location: [facility] ENDOSCOPY;  Service: Gastroenterology;  Laterality: N/A;   ESOPHAGOGASTRODUODENOSCOPY (EGD) WITH PROPOFOL N/A 01/01/2022   Procedure: ESOPHAGOGASTRODUODENOSCOPY (  EGD) WITH PROPOFOL;  Surgeon: Wyline Mood, MD;  Location: [facility] ENDOSCOPY;  Service: Gastroenterology;  Laterality: N/A;   ESOPHAGOGASTRODUODENOSCOPY (EGD) WITH PROPOFOL N/A 01/06/2022   Procedure: ESOPHAGOGASTRODUODENOSCOPY (EGD) WITH PROPOFOL;  Surgeon: Wyline Mood, MD;  Location: [facility] ENDOSCOPY;  Service: Gastroenterology;  Laterality: N/A;   HAMMER TOE SURGERY Right 11/21/2020   Procedure:  HAMMERTOE CORRECTION;  Surgeon: Gwyneth Revels, DPM;  Location: [facility];  Service: Podiatry;  Laterality: Right;   INSERTION OF MESH  04/16/2021   Procedure: INSERTION OF MESH;  Surgeon: Henrene Dodge, MD;  Location: [facility] ORS;  Service: General;;   KNEE ARTHROSCOPY WITH MEDIAL MENISECTOMY Right 09/16/2012   Procedure: RIGHT KNEE ARTHROSCOPY WITH MEDIAL AND LATERAL MENISECTOMY, CHONDROPLASTY;  Surgeon: Loreta Ave, MD;  Location: [facility];  Service: Orthopedics;  Laterality: Right;  RIGHT KNEE SCOPE MEDIAL MENISCECTOMY   LEFT HEART CATH AND CORONARY ANGIOGRAPHY N/A 06/11/2016   Procedure: Left Heart Cath and Coronary Angiography;  Surgeon: Antonieta Iba, MD;  Location: [facility] INVASIVE CV LAB;  Service: Cardiovascular;  Laterality: N/A;   UMBILICAL HERNIA REPAIR N/A 04/16/2021   Procedure: HERNIA REPAIR UMBILICAL ADULT, open;  Surgeon: Henrene Dodge, MD;  Location: [facility] ORS;  Service: General;  Laterality: N/A;    Family History  Problem Relation Age of Onset   Heart disease Father 98       MI   Heart attack Father    Stomach cancer Father    Hypertension Mother    Breast cancer Mother    Social History:  reports that he has never smoked. He has been exposed to tobacco smoke. He quit smokeless tobacco use about 37 years ago.  His smokeless tobacco use included chew. He reports that he does not currently use alcohol. He reports that he does not use drugs.  Allergies:  Allergies  Allergen Reactions   Statins Hives and Rash    Medications:  Prior to Admission medications   Medication Sig Start Date End Date Taking? Authorizing Provider  citalopram (CELEXA) 40 MG tablet TAKE 1 TABLET BY MOUTH EVERY DAY 07/21/22   McDonough, Lauren K, PA-C  furosemide (LASIX) 20 MG tablet TAKE 1 TABLET BY MOUTH EVERY DAY AS NEEDED FOR LEG SWELLING 07/21/22   McDonough, Lauren K, PA-C  gabapentin (NEURONTIN) 300 MG capsule Take 1 capsule (300 mg total) by mouth 3 (three) times  daily. 04/03/22   McDonough, Salomon Fick, PA-C  hydrOXYzine (VISTARIL) 25 MG capsule Take 1 capsule (25 mg total) by mouth every 8 (eight) hours as needed. 09/01/22   McDonough, Salomon Fick, PA-C  lisinopril-hydrochlorothiazide (ZESTORETIC) 20-25 MG tablet Take 1 tablet by mouth daily. 06/20/22   [provider]  Multiple Vitamins-Minerals (MULTIVITAMIN ADULTS PO) Take 1 tablet by mouth daily.    [provider]  nitroGLYCERIN (NITROSTAT) 0.4 MG SL tablet Place 1 tablet (0.4 mg total) under the tongue every 5 (five) minutes x 3 doses as needed for chest pain. 04/25/21   Marrion Coy, MD  Semaglutide,0.25 or 0.5MG /DOS, 2 MG/3ML SOPN Inject 0.5 mg into the skin once a week. 09/29/22   McDonough, Salomon Fick, PA-C  Testosterone 20.25 MG/ACT (1.62%) GEL Place 40.5 mg onto the skin daily. This would be two pumps daily 01/14/22   Marvel Plan Wellington Hampshire, PA-C     Physical Examination: There were no vitals taken for this visit.  Neurologic Examination: NIHSS components Score: Comment  1a Level of Conscious 0[x]  1[]  2[]  3[]      1b LOC Questions 0[x]   1[]  2[]       1c LOC Commands 0[x]  1[]  2[]       2 Best Gaze 0[x]  1[]  2[]       3 Visual 0[x]  1[]  2[]  3[]      4 Facial Palsy 0[x]  1[]  2[]  3[]      5a Motor Arm - left 0[]  1[x]  2[]  3[]  4[]  UN[]    5b Motor Arm - Right 0[x]  1[]  2[]  3[]  4[]  UN[]    6a Motor Leg - Left 0[]  1[x]  2[]  3[]  4[]  UN[]    6b Motor Leg - Right 0[x]  1[]  2[]  3[]  4[]  UN[]    7 Limb Ataxia 0[x]  1[]  2[]  3[]  UN[]     8 Sensory 0[]  1[x]  2[]  UN[]      9 Best Language 0[x]  1[]  2[]  3[]      10 Dysarthria 0[x]  1[]  2[]  UN[]      11 Extinct. and Inattention 0[]  1[x]  2[]       TOTAL: 4     Results for orders placed or performed during the hospital encounter of 11/10/22 (from the past 48 hour(s))  CBG monitoring, ED     Status: Abnormal   Collection Time: 11/10/22  5:53 PM  Result Value Ref Range   Glucose-Capillary 146 (H) 70 - 99 mg/dL    Comment: Glucose reference range applies only to samples  taken after fasting for at least 8 hours.   CT HEAD CODE STROKE WO CONTRAST  Result Date: 11/10/2022 CLINICAL DATA:  Code stroke.  No lateralizing information provided. EXAM: CT HEAD WITHOUT CONTRAST TECHNIQUE: Contiguous axial images were obtained from the base of the skull through the vertex without intravenous contrast. RADIATION DOSE REDUCTION: This exam was performed according to the departmental dose-optimization program which includes automated exposure control, adjustment of the mA and/or kV according to patient size and/or use of iterative reconstruction technique. COMPARISON:  CT Head 08/30/18 FINDINGS: Brain: No hemorrhage. No hydrocephalus. No extra-axial fluid collection. No mass effect. No mass lesion. No CT evidence of an acute cortical infarct. Vascular: No disproportionately hyperdense vessel or unexpected calcification. Skull: Normal. Negative for fracture or focal lesion. Sinuses/Orbits: No middle ear or mastoid effusion. Paranasal sinuses are clear. Orbits are unremarkable. Other: None. ASPECTS [facility] Stroke Program Early CT Score): 10 IMPRESSION: No hemorrhage or CT evidence of an acute cortical infarct. Aspects is 10. Electronically Signed   By: Lorenza Cambridge M.D.   On: 11/10/2022 18:07    Assessment: 57 y.o. male with a history of hypertension, HLD, carotid artery stenosis, OSA and CAD who presents with complaints of left sided numbness and weakness and onset at 1430 today (11/10/2022).  NIHSS of 4.  Head CT personally reviewed and shows no acute changes.  Risks and benefits of thrombolytic administration explained to patient as well as alternatives.  Patient initially declined thrombolytic therapy.  After wife arrived decided upon treatment and contraindications were reviewed.  TNK administered at 1854 with no complications.  BP at time of administration 173/94.  Blood sugar 146.  CTA of the head and neck and CTP personally reviewed as well.  No evidence of LVO on CTA and no perfusion  mismatch on perfusion testing therefore patient not a thrombectomy candidate..    Stroke Risk Factors - carotid stenosis, hyperlipidemia, hypertension, and OSA  Plan: 1. HgbA1c, fasting lipid panel.  Target A1c<7.0, target LDL<70. 2. MRI brain without contrast 3. PT consult, OT consult, Speech consult 4. Echocardiogram 5. No antiplatelet or anticoagulation therapy until 24-hour imaging performed and hemorrhage ruled out 6. BP control<180/105   7. Telemetry  monitoring 8. Frequent neuro checks as per post thrombolytic order set 9. Repeat CT imaging without contrast to be performed 24 hours after thrombolytic administration, unless MRI is performed at that time. 10. If acute worsening noted patient to have stat head CT without contrast and neurology to be contacted  This patient is receiving care for acute neurological changes consistent with acute CVA. There was 70 minutes of care by this provider at the time of service, including time for direct evaluation via telemedicine, review of medical records, imaging studies and discussion of findings with providers, the patient and/or family.  Thana Farr, MD Neurology   If 8pm- 8am, please page neurology on call as listed in AMION. 1.   Thana Farr, MD Neurology  11/10/2022, 6:19 PM

## 2022-11-10 NOTE — ED Notes (Signed)
Writer received call from Tele-neurology Stroke coordinator who was not updated by Neurologist, Thad Ranger, MD, on the patients status change related to TNK administration.

## 2022-11-10 NOTE — Progress Notes (Signed)
eLink Physician-Brief Progress Note Patient Name: Gerald [name] DOB: Mar 19, 1965 MRN: [id number]   Date of Service  11/10/2022  HPI/Events of Note  57 year old male with a history of obstructive sleep apnea, morbid obesity with BMI 42, and chronic carotid stenosis who initially presented to the emergency department with new left-sided numbness and weakness starting at 1430 diagnosed with a new stroke and received TNK.  Arrives in the ICU for post TNK management.  Initially hypertensive to the 160s-180s systolic.  Otherwise normal vitals on room air.  Headache is completely resolved.  Metabolic panel with mild electrolyte disturbances and minimal hyperglycemia.  Essentially normal blood counts.  CT head with no acute intracranial abnormalities.  Moderate PCA stenosis on angiography without evidence of acute LVO.  eICU Interventions  TNK precautions for 24 hours.  Administered at 1900  Standard stroke workup already ordered.  SBP goal less than 180, labetalol and as needed clevidipine as needed  Consider discontinuation of home testosterone  GI prophylaxis with pantoprazole-no clear indication, consider discontinuation DVT prophylaxis with SCDs while TNK active.     Intervention Category Evaluation Type: New Patient Evaluation  [name] 11/10/2022, 11:50 PM

## 2022-11-10 NOTE — H&P (Signed)
NAME:  Gerald [name], MRN:  [id number], DOB:  May 31, 1965, LOS: 0 ADMISSION DATE:  11/10/2022, CONSULTATION DATE:  11/10/22 REFERRING MD:  Dr. Fuller Plan, CHIEF COMPLAINT: Weakness    History of Present Illness:  57 yo M presenting to [facility] ED from home via EMS on 11/10/22 for evaluation of Left sided hemiparesis & weakness.  History provided per chart review and bedside patient report. Patient reports being in his normal state of health until 14:30 when he suddenly noticed acute onset of Left sided numbness and weakness which started in his left leg and moved upwards to involve his entire left side, all the way up to the back of his head. He denied chest pain, dyspnea, abdominal pain, blurred vision, falls or LOC. He did endorse a mild headache 5/10 that started when EMS arrived and has continued to worsen. PMHx notes migraines however patient says this is NOT accurate and he dose not normally have headaches and does not take any medication for headaches. Patient denied tobacco use, ETOH or recreational drug use. He has not run out of any medication and has been taking everything as prescribed. He did note earlier this week his BP was 190's/100's.  ED course: Upon arrival patient CODE STROKE. Patient A&O, hypertensive but otherwise stable vitals on RA. Patient eligible for TNK, STAT neurology consult and the patient received a dose at 18:54. Initial imaging negative for abnormality except for moderate stenosis of the left and right PCA. Labs reassuring with mild leukocytosis- suspect reactive and mildly elevated ALT and glucose. Post TNK patient's headache worsened to 8/10. STAT repeat head CT obtained, negative for bleeding/any abnormality.  Medications given: tylenol, TNK Initial Vitals: 13, 78, 173/94 & 99% on RA Significant labs: (Labs/ Imaging personally reviewed)  Chemistry: Na+: 135, K+: 3.7, BUN/Cr.: 13/ 0.88, Serum CO2/ AG: 24/ 8, ALT: 52 Hematology: WBC: 10.6, Hgb: 14.9,   CT head wo  contrast 11/10/22: No hemorrhage or evidence of acute cortical infarct. Aspects is 10. CT angio head/neck 11/10/22: no LVO, moderate stenosis of the LEFT PCA origin and proximal P2 segment of the Riht PCA. No hemodynamically significant stenosis in the neck. No core infarct or ischemic penumbra on CT perfusion.  PCCM consulted for admission due to suspected acute CVA status post thrombolytic administration.  Pertinent  Medical History  HLD Non-obstructive CAD OSA on CPAP Chronic pain syndrome Carotid Stenosis T2DM HTN Morbid obesity  Significant Hospital Events: Including procedures, antibiotic start and stop dates in addition to other pertinent events   11/10/22: Admit to ICU with suspected acute ischemic CVA status post thrombolytic administration.   Interim History / Subjective:  Patient A&O with spouse bedside. Only current complaint is an 8/10 headache that has not improved after tylenol. STAT tele-neurology consultation due to worsening headache and repeat imaging post TNK admin per protocol. NIHSS remains unchanged at 4, repeat imaging negative for abnormality  Objective   Blood pressure (!) 184/86, pulse 76, resp. rate 20, height 6\' 2"  (1.88 m), weight (!) 148.1 kg, SpO2 96%.        Intake/Output Summary (Last 24 hours) at 11/10/2022 1928 Last data filed at 11/10/2022 1833 Gross per 24 hour  Intake 3 ml  Output --  Net 3 ml   Filed Weights   11/10/22 1842  Weight: (!) 148.1 kg    Examination: General: Adult male, acutely ill, lying in bed, NAD HEENT: MM pink/moist, anicteric, atraumatic, neck supple Neuro: A&O x 4, able to follow commands, PERRL +3, MAE CN:  II - Visual field intact OU III, IV, VI - Extraocular movements intact. V - Facial sensation intact right side, no sensation noted on LEFT side. VII - Facial movement intact bilaterally VIII - Hearing & vestibular intact bilaterally X - Palate elevates symmetrically XI - Chin turning & shoulder shrug intact  bilaterally. XII - Tongue protrusion intact Motor Strength - strength 5/5 and pronator drift was absent in RUE & RLE. LUE & LLE 3/5 Sensory - Light touch was assessed and was asymmetrical, patient unaware of any sensation on his Left side Coordination - The patient had normal movements in the hands and feet with no ataxia or dysmetria.  Tremor was absent Gait and Station - deferred.  NIHSS Level of  Consciousness: 0 = Alert, keenly responsive; 1 = Not alert, but arousable by minor stimulation to obey, answer, or respond; 2 = Not alert, requires repeated stimulation to attend, or is obtunded and requires strong or painful stimulation to make movements (not stereotyped); 3 = Responds only with reflex motor or autonomic effects or totally unresponsive, flaccid, and areflexic   0  LOC Questions 0 = Answers both questions correctly; 1 = Answers one question correctly; 2 = Answers neither question correctly.  0  LOC Commands 0 = Performs both tasks correctly; 1 = Performs one task correctly; 2 = Performs neither task correctly  0  Best Gaze  0 = Normal; 1 = Partial gaze palsy; gaze is abnormal in one or both eyes, but forced deviation or total gaze paresis is not present; 2 = Forced deviation, or total gaze paresis not overcome by the oculocephalic maneuver   0  Visual 0 = No visual loss; 1 = Partial hemianopia; 2 = Complete hemianopia; 3 = Bilateral hemianopia (blind including cortical blindness).   0  Facial Palsy 0 = Normal symmetrical movements; 1 = Minor paralysis (flattened nasolabial fold, asymmetry on smiling); 2 = Partial paralysis (total or near-total paralysis of lower face); 3 = Complete paralysis of one or both sides (absence of facial movement in the upper and lower face).  0  Motor Arm LEFT 0 = No drift; limb holds 90 (or 45) degrees for full 10 secs; 1 = Drift; limb holds 90 (or 45) degrees, but drifts down before full 10 seconds; does not hit bed or other support; 2 = Some effort against  gravity; limb cannot get to or maintain (if cued) 90 (or 45) degrees, drifts down to bed, but has some effort against gravity; 3 = No effort against gravity; limb falls; 4 = No movement; UN = unable to test (Amputation or joint fusion).   1  Motor Arm RIGHT 0 = No drift; limb holds 90 (or 45) degrees for full 10 secs; 1 = Drift; limb holds 90 (or 45) degrees, but drifts down before full 10 seconds; does not hit bed or other support; 2 = Some effort against gravity; limb cannot get to or maintain (if cued) 90 (or 45) degrees, drifts down to bed, but has some effort against gravity; 3 = No effort against gravity; limb falls; 4 = No movement; UN = unable to test (Amputation or joint fusion).   0  Motor Leg LEFT 0 = No drift; leg holds 30 degree position for full 5 secs; 1 = Drift; leg falls by the end of the 5-sec period but does not hit bed; 2 = Some effort against gravity; leg falls to bed by 5 secs, but has some effort against gravity; 3 = No  effort against gravity; leg falls to bed immediately; 4 = No movement; UN = unable to test (Amputation or joint fusion).   1  Motor Leg RIGHT 0 = No drift; leg holds 30 degree position for full 5 secs; 1 = Drift; leg falls by the end of the 5-sec period but does not hit bed; 2 = Some effort against gravity; leg falls to bed by 5 secs, but has some effort against gravity; 3 = No effort against gravity; leg falls to bed immediately; 4 = No movement; UN = unable to test (Amputation or joint fusion).   0  Limb Ataxia 0 = Absent; 1 = Present in one limb; 2 = Present in two limbs; UN = Amputation or joint fusion   0  Sensory  0 = Normal, no sensory loss; 1 = Mild-to-moderate sensory loss, patient feels pinprick is less sharp or is dull on the affected side, or there is a loss of superficial pain with pinprick, but patient is aware of being touched; 2 = Severe to total sensory loss, patient is not aware of being touched in the face, arm, and leg.   2  Best Language 0 = No  aphasia; normal; 1 = Mild-to-moderate aphasia; some obvious loss of fluency or facility of comprehension, w/o significant limitation on ideas expressed or form of expression. Reduction of speech and/or comprehension, however, makes conversation about provided materials difficult or impossible. For example, in conversation about provided materials, examiner can identify picture or naming card content from patient's response; 2 = Severe aphasia; all communication is through fragmentary expression; great need for inference, questioning, and guessing by the listener. Range of information that can be exchanged is limited; listener carries burden of communication. Examiner cannot identify materials provided from patient response; 3 = Mute, global aphasia; no usable speech or auditory comprehension  0  Dysarthria 0 = Normal; 1 = Mild-to-moderate dysarthria, patient slurs at least some words and, at worst, can be understood with some difficulty; 2 = Severe dysarthria, patient's speech is so slurred as to be unintelligible in the absence of or out of proportion to any dysphasia, or is mute/anarthric; UN = Intubated or other physical barrier  0  Extinction/Inattention 0 = No abnormality 1 = Visual, tactile, auditory, spatial, or personal extinction/inattention to bilateral simultaneous stimulation in one of the sensory modalities. 2 = Profound hemi-inattention or extinction to more than one modality; does not recognize own hand or orients to only one side of space.  0  Total   4  CV: s1s2 RRR, NSR on monitor, no r/m/g Pulm: Regular, non labored on RA , breath sounds clear-BUL & clear/diminished-BLL GI: soft, rounded, non tender, bs x 4 Skin: no rashes/lesions noted Extremities: warm/dry, pulses + 2 R/P, no edema noted  Resolved Hospital Problem list     Assessment & Plan:  Suspected Acute Ischemic Stroke PMHx: CAD, HTN, HLD TNK administered: 18:54 - NIHSS & neuro checks Q 82m x 2h, q 82m x 6h, q 1h x 16  hours for 24 hours - follow up MRI 24 hours post  - perform bedside swallow > SLP eval if needed - PT/OT consult - Neurology consulted, appreciate input - Echocardiogram ordered - f/u Lipid panel & Hgb A1c  -SCD's for VTE prophylaxis, no anti-platelet therapy or anticoagulation until repeat imaging 24 hours post rules out hemorrhage - labetalol Q 2h PRN to maintain BP < 180/105, consider clevidipine drip if needed  Headache - schedule Tylenol - Reglan 10 mg x  1, Mg 2g x 1 - start LR infusion - consider VPA per neuro recs for continued headache - avoid opiates & fiorect  Type 2 Diabetes Mellitus Neuropathy - HgbA1c 9.5 (09/01/22) goal < 7.0 - Q 4 CBG monitoring - SSI  moderate scale - follow ICU hypo/hyper-glycemia protocol - hold gabapentin for now due to close neurological monitoring, consider restarting after 24 hour window is up post TNK  OSA on CPAP - continue home CPAP - continuous pulse oximetry monitoring - supplemental O2 PRN to maintain SpO2 > 92%  Hypertension - hold outpatient PO anti-hypertensive medications at this time - see above, use labetalol IVP and/or clevidipine drip  Hyperlipidemia - follow up lipid panel with AM labs, initiate Lipitor if needed  Best Practice (right click and "Reselect all SmartList Selections" daily)  Diet/type: NPO w/ oral meds DVT prophylaxis: SCD GI prophylaxis: PPI Lines: N/A Foley:  N/A Code Status:  full code Last date of multidisciplinary goals of care discussion [11/10/22]  Labs   CBC: Recent Labs  Lab 11/10/22 1830  WBC 10.6*  NEUTROABS 5.2  HGB 14.9  HCT 42.5  MCV 85.0  PLT 246    Basic Metabolic Panel: Recent Labs  Lab 11/10/22 1830  NA 135  K 3.7  CL 103  CO2 24  GLUCOSE 147*  BUN 13  CREATININE 0.88  CALCIUM 9.0   GFR: Estimated Creatinine Clearance: 144 mL/min (by C-G formula based on SCr of 0.88 mg/dL). Recent Labs  Lab 11/10/22 1830  WBC 10.6*    Liver Function Tests: Recent Labs   Lab 11/10/22 1830  AST 38  ALT 52*  ALKPHOS 87  BILITOT 1.0  PROT 7.4  ALBUMIN 3.9   No results for input(s): "LIPASE", "AMYLASE" in the last 168 hours. No results for input(s): "AMMONIA" in the last 168 hours.  ABG    Component Value Date/Time   PHART 7.47 (H) 10/18/2015 0039   PCO2ART 40 10/18/2015 0039   PO2ART 72 (L) 10/18/2015 0039   HCO3 29.1 (H) 10/18/2015 0039   TCO2 24 07/17/2017 1647   O2SAT 95.0 10/18/2015 0054     Coagulation Profile: Recent Labs  Lab 11/10/22 1830  INR 1.1    Cardiac Enzymes: No results for input(s): "CKTOTAL", "CKMB", "CKMBINDEX", "TROPONINI" in the last 168 hours.  HbA1C: Hemoglobin A1C  Date/Time Value Ref Range Status  09/01/2022 11:03 AM 9.5 (A) 4.0 - 5.6 % Final  12/30/2021 01:36 PM 6.1 (A) 4.0 - 5.6 % Final  05/23/2014 02:25 AM 7.1 (H) % Final    Comment:    4.0-6.0 NOTE: New Reference Range  04/11/14   09/13/2011 03:07 AM 6.8 (H) 4.2 - 6.3 % Final    Comment:    The American Diabetes Association recommends that a primary goal of therapy should be <7% and that physicians should reevaluate the treatment regimen in patients with HbA1c values consistently >8%.    Hgb A1c MFr Bld  Date/Time Value Ref Range Status  09/10/2021 08:42 AM 6.5 (H) 4.8 - 5.6 % Final    Comment:             Prediabetes: 5.7 - 6.4          Diabetes: >6.4          Glycemic control for adults with diabetes: <7.0   06/10/2017 05:33 AM 11.0 (H) 4.8 - 5.6 % Final    Comment:    (NOTE) Pre diabetes:          5.7%-6.4% Diabetes:              >  6.4% Glycemic control for   <7.0% adults with diabetes     CBG: Recent Labs  Lab 11/10/22 1753  GLUCAP 146*    Review of Systems: Positives in BOLD  Gen: Denies fever, chills, weight change, fatigue, night sweats HEENT: Denies blurred vision, double vision, hearing loss, tinnitus, sinus congestion, rhinorrhea, sore throat, neck stiffness, dysphagia PULM: Denies shortness of breath, cough, sputum  production, hemoptysis, wheezing CV: Denies chest pain, edema, orthopnea, paroxysmal nocturnal dyspnea, palpitations GI: Denies abdominal pain, nausea, vomiting, diarrhea, hematochezia, melena, constipation, change in bowel habits GU: Denies dysuria, hematuria, polyuria, oliguria, urethral discharge Endocrine: Denies hot or cold intolerance, polyuria, polyphagia or appetite change Derm: Denies rash, dry skin, scaling or peeling skin change Heme: Denies easy bruising, bleeding, bleeding gums Neuro: Denies headache, numbness, weakness, slurred speech, loss of memory or consciousness  Past Medical History:  He,  has a past medical history of Abnormal LFTs, Anxiety, Atypical chest pain, Borderline diabetes, CAD (coronary artery disease), Carcinoma (HCC), Chronic pain syndrome, Depression, Fundic gland polyps of stomach, benign, Gastritis, GERD (gastroesophageal reflux disease), Hepatic steatosis, Hyperlipidemia, Hypertension, Migraines, Myocardial infarction (HCC) (2008), Nonischemic cardiomyopathy (HCC), Obesity, Occlusion and stenosis of bilateral carotid arteries, OSA on CPAP, Osteoarthritis of both knees, Shortness of breath, and Squamous cell carcinoma of skin (02/27/2020).   Surgical History:   Past Surgical History:  Procedure Laterality Date   BONE EXCISION Right 11/21/2020   Procedure: PART EXCISION BONE- PHALANX;  Surgeon: Gwyneth Revels, DPM;  Location: [facility];  Service: Podiatry;  Laterality: Right;   CARDIAC CATHETERIZATION N/A 09/2011   [facility]; EF 50% with 30% mid LAD stenosis and no obstructive disease.   CARDIAC CATHETERIZATION  09/2010   [facility]; Mid LAD 40% stenosis; Mid Circumflex:Normal; Mid RCA; Normal   CARDIAC CATHETERIZATION     COLONOSCOPY     COLONOSCOPY WITH PROPOFOL N/A 04/10/2021   Procedure: COLONOSCOPY WITH PROPOFOL;  Surgeon: Wyline Mood, MD;  Location: [facility] END[facility];  Service: Gastroenterology;  Laterality: N/A;   ESOPHAGOGASTRODUODENOSCOPY N/A 04/10/2021    Procedure: ESOPHAGOGASTRODUODENOSCOPY (EGD);  Surgeon: Wyline Mood, MD;  Location: [facility] ENDOSCOPY;  Service: Gastroenterology;  Laterality: N/A;   ESOPHAGOGASTRODUODENOSCOPY (EGD) WITH PROPOFOL N/A 06/10/2017   Procedure: ESOPHAGOGASTRODUODENOSCOPY (EGD) WITH PROPOFOL;  Surgeon: Wyline Mood, MD;  Location: [facility] END[facility]COPY;  Service: Gastroenterology;  Laterality: N/A;   ESOPHAGOGASTRODUODENOSCOPY (EGD) WITH PROPOFOL N/A 01/01/2022   Procedure: ESOPHAGOGASTRODUODENOSCOPY (EGD) WITH PROPOFOL;  Surgeon: Wyline Mood, MD;  Location: [facility] ENDOSCOPY;  Service: Gastroenterology;  Laterality: N/A;   ESOPHAGOGASTRODUODENOSCOPY (EGD) WITH PROPOFOL N/A 01/06/2022   Procedure: ESOPHAGOGASTRODUODENOSCOPY (EGD) WITH PROPOFOL;  Surgeon: Wyline Mood, MD;  Location: [facility] ENDOSCOPY;  Service: Gastroenterology;  Laterality: N/A;   HAMMER TOE SURGERY Right 11/21/2020   Procedure: HAMMERTOE CORRECTION;  Surgeon: Gwyneth Revels, DPM;  Location: [facility];  Service: Podiatry;  Laterality: Right;   INSERTION OF MESH  04/16/2021   Procedure: INSERTION OF MESH;  Surgeon: Henrene Dodge, MD;  Location: [facility] ORS;  Service: General;;   KNEE ARTHROSCOPY WITH MEDIAL MENISECTOMY Right 09/16/2012   Procedure: RIGHT KNEE ARTHROSCOPY WITH MEDIAL AND LATERAL MENISECTOMY, CHONDROPLASTY;  Surgeon: Loreta Ave, MD;  Location: [facility];  Service: Orthopedics;  Laterality: Right;  RIGHT KNEE SCOPE MEDIAL MENISCECTOMY   LEFT HEART CATH AND CORONARY ANGIOGRAPHY N/A 06/11/2016   Procedure: Left Heart Cath and Coronary Angiography;  Surgeon: Antonieta Iba, MD;  Location: [facility] INVASIVE CV LAB;  Service: Cardiovascular;  Laterality: N/A;   UMBILICAL HERNIA REPAIR N/A 04/16/2021  Procedure: HERNIA REPAIR UMBILICAL ADULT, open;  Surgeon: Henrene Dodge, MD;  Location: [facility] ORS;  Service: General;  Laterality: N/A;     Social History:   reports that he has never smoked. He has been exposed to tobacco smoke. He quit  smokeless tobacco use about 37 years ago.  His smokeless tobacco use included chew. He reports that he does not currently use alcohol. He reports that he does not use drugs.   Family History:  His family history includes Breast cancer in his mother; Heart attack in his father; Heart disease (age of onset: 21) in his father; Hypertension in his mother; Stomach cancer in his father.   Allergies Allergies  Allergen Reactions   Statins Hives and Rash     Home Medications  Prior to Admission medications   Medication Sig Start Date End Date Taking? Authorizing Provider  citalopram (CELEXA) 40 MG tablet TAKE 1 TABLET BY MOUTH EVERY DAY 07/21/22   McDonough, Lauren K, PA-C  furosemide (LASIX) 20 MG tablet TAKE 1 TABLET BY MOUTH EVERY DAY AS NEEDED FOR LEG SWELLING 07/21/22   McDonough, Lauren K, PA-C  gabapentin (NEURONTIN) 300 MG capsule Take 1 capsule (300 mg total) by mouth 3 (three) times daily. 04/03/22   McDonough, Salomon Fick, PA-C  hydrOXYzine (VISTARIL) 25 MG capsule Take 1 capsule (25 mg total) by mouth every 8 (eight) hours as needed. 09/01/22   McDonough, Salomon Fick, PA-C  lisinopril-hydrochlorothiazide (ZESTORETIC) 20-25 MG tablet Take 1 tablet by mouth daily. 06/20/22   [provider]  Multiple Vitamins-Minerals (MULTIVITAMIN ADULTS PO) Take 1 tablet by mouth daily.    [provider]  nitroGLYCERIN (NITROSTAT) 0.4 MG SL tablet Place 1 tablet (0.4 mg total) under the tongue every 5 (five) minutes x 3 doses as needed for chest pain. 04/25/21   Marrion Coy, MD  Semaglutide,0.25 or 0.5MG /DOS, 2 MG/3ML SOPN Inject 0.5 mg into the skin once a week. 09/29/22   McDonough, Salomon Fick, PA-C  Testosterone 20.25 MG/ACT (1.62%) GEL Place 40.5 mg onto the skin daily. This would be two pumps daily 01/14/22   Harle Battiest, PA-C     Critical care time: 70 minutes     Betsey Holiday, AGACNP-BC Acute Care Nurse Practitioner  Pulmonary & Critical Care   [phone] /  [phone] Please see Amion for details.

## 2022-11-10 NOTE — ED Notes (Signed)
Gave report to ICU at this time.

## 2022-11-10 NOTE — ED Triage Notes (Signed)
Arrives from home via [facility] with C/OI left arm, leg and hand weakness.  LKW:  1430. LVO - 1  DR. Wells at bedside upon patien tarrival to assess.  Cleared for CT.

## 2022-11-10 NOTE — Progress Notes (Signed)
Telestroke cart was activated at 1749. Per treatment team, pt's LKW was at 1430 with c/o L sided weakness to arm/leg and HA. Dr. Fuller Plan, EDP at bedside assessing patient at time of cart activation. Pt transported to CT at 1753 and returned to room at 1826. TSMD was paged for code stroke at 1750. Dr. Thad Ranger, TSMD appeared on telestroke cart at 1752 to assess the patient. Based on TSMD's assessment, pt does not meet criteria for emergent interventions at this time 2/2 pt refused. TSMD to f/u with EDP regarding recommendations. No further needs from Telestroke RN at this time. Telestroke cart disconnected at [employer].

## 2022-11-11 ENCOUNTER — Inpatient Hospital Stay: Payer: Self-pay

## 2022-11-11 ENCOUNTER — Inpatient Hospital Stay (HOSPITAL_COMMUNITY)
Admit: 2022-11-11 | Discharge: 2022-11-11 | Disposition: A | Discharge status: Home / Self Care | Payer: Self-pay | Attending: Pulmonary Disease | Admitting: Pulmonary Disease

## 2022-11-11 DIAGNOSIS — I6389 Other cerebral infarction: Secondary | ICD-10-CM

## 2022-11-11 DIAGNOSIS — I639 Cerebral infarction, unspecified: Secondary | ICD-10-CM

## 2022-11-11 LAB — BASIC METABOLIC PANEL
Anion gap: 8 (ref 5–15)
BUN: 12 mg/dL (ref 6–20)
CO2: 27 mmol/L (ref 22–32)
Calcium: 8.7 mg/dL — ABNORMAL LOW (ref 8.9–10.3)
Chloride: 102 mmol/L (ref 98–111)
Creatinine, Ser: 0.86 mg/dL (ref 0.61–1.24)
GFR, Estimated: >60 mL/min (ref >60)
Glucose, Bld: 136 mg/dL — ABNORMAL HIGH (ref 70–99)
Potassium: 3.3 mmol/L — ABNORMAL LOW (ref 3.5–5.1)
Sodium: 137 mmol/L (ref 135–145)

## 2022-11-11 LAB — ECHOCARDIOGRAM COMPLETE
AR max vel: 3.01 cm2
AV Area VTI: 3.25 cm2
AV Area mean vel: 3.07 cm2
AV Mean grad: 5 mm[Hg]
AV Peak grad: 9.4 mm[Hg]
Ao pk vel: 1.53 m/s
Area-P 1/2: 3.83 cm2
Height: 74 in
MV VTI: 3.18 cm2
S' Lateral: 3.7 cm
Single Plane A4C EF: 53.8 %
Weight: 5178.16 [oz_av]

## 2022-11-11 LAB — HEPATIC FUNCTION PANEL
ALT: 48 U/L — ABNORMAL HIGH (ref 0–44)
AST: 32 U/L (ref 15–41)
Albumin: 3.7 g/dL (ref 3.5–5.0)
Alkaline Phosphatase: 64 U/L (ref 38–126)
Bilirubin, Direct: 0.1 mg/dL (ref 0.0–0.2)
Indirect Bilirubin: 0.8 mg/dL (ref 0.3–0.9)
Total Bilirubin: 0.9 mg/dL (ref 0.3–1.2)
Total Protein: 7 g/dL (ref 6.5–8.1)

## 2022-11-11 LAB — GLUCOSE, CAPILLARY
Glucose-Capillary: 146 mg/dL — ABNORMAL HIGH (ref 70–99)
Glucose-Capillary: 134 mg/dL — ABNORMAL HIGH (ref 70–99)
Glucose-Capillary: 166 mg/dL — ABNORMAL HIGH (ref 70–99)
Glucose-Capillary: 185 mg/dL — ABNORMAL HIGH (ref 70–99)
Glucose-Capillary: 172 mg/dL — ABNORMAL HIGH (ref 70–99)
Glucose-Capillary: 187 mg/dL — ABNORMAL HIGH (ref 70–99)
Glucose-Capillary: 172 mg/dL — ABNORMAL HIGH (ref 70–99)

## 2022-11-11 LAB — MAGNESIUM: Magnesium: 2.2 mg/dL (ref 1.7–2.4)

## 2022-11-11 LAB — CBC
HCT: 41.3 % (ref 39.0–52.0)
Hemoglobin: 14.4 g/dL (ref 13.0–17.0)
MCH: 29.6 pg (ref 26.0–34.0)
MCHC: 34.9 g/dL (ref 30.0–36.0)
MCV: 84.8 fL (ref 80.0–100.0)
Platelets: 201 10*3/uL (ref 150–400)
RBC: 4.87 MIL/uL (ref 4.22–5.81)
RDW: 13.2 % (ref 11.5–15.5)
WBC: 8.2 10*3/uL (ref 4.0–10.5)
nRBC: 0 % (ref 0.0–0.2)

## 2022-11-11 LAB — LIPID PANEL
Cholesterol: 211 mg/dL — ABNORMAL HIGH (ref 0–200)
HDL: 35 mg/dL — ABNORMAL LOW (ref >40)
LDL Cholesterol: 140 mg/dL — ABNORMAL HIGH (ref 0–99)
Total CHOL/HDL Ratio: 6 {ratio}
Triglycerides: 182 mg/dL — ABNORMAL HIGH (ref <150)
VLDL: 36 mg/dL (ref 0–40)

## 2022-11-11 LAB — HIV ANTIBODY (ROUTINE TESTING W REFLEX): HIV Screen 4th Generation wRfx: NONREACTIVE

## 2022-11-11 LAB — MRSA NEXT GEN BY PCR, NASAL: MRSA by PCR Next Gen: NOT DETECTED

## 2022-11-11 LAB — PHOSPHORUS: Phosphorus: 4.3 mg/dL (ref 2.5–4.6)

## 2022-11-11 MED ORDER — CHLORHEXIDINE GLUCONATE CLOTH 2 % EX PADS
6.0000 | MEDICATED_PAD | Freq: Every day | CUTANEOUS | Status: DC
  Administered 2022-11-10: 6 via TOPICAL

## 2022-11-11 MED ORDER — POTASSIUM CHLORIDE 20 MEQ PO PACK
40.0000 meq | PACK | ORAL | Status: DC
  Administered 2022-11-11: 40 meq via ORAL
  Filled 2022-11-11: qty 2

## 2022-11-11 MED ORDER — POTASSIUM CHLORIDE 10 MEQ/100ML IV SOLN
10.0000 meq | INTRAVENOUS | Status: DC
  Administered 2022-11-11: 10 meq via INTRAVENOUS
  Filled 2022-11-11: qty 100

## 2022-11-11 MED ORDER — ALPRAZOLAM 0.5 MG PO TABS
0.5000 mg | ORAL_TABLET | Freq: Once | ORAL | Status: AC
  Administered 2022-11-11: 0.5 mg via ORAL
  Filled 2022-11-11: qty 1

## 2022-11-11 MED ORDER — ORAL CARE MOUTH RINSE: 15.0000 mL | OROMUCOSAL | Status: DC | PRN

## 2022-11-11 MED ORDER — EZETIMIBE 10 MG PO TABS
10.0000 mg | ORAL_TABLET | Freq: Every day | ORAL | Status: DC
  Administered 2022-11-11 – 2022-11-12 (×2): 10 mg via ORAL
  Filled 2022-11-11 (×2): qty 1

## 2022-11-11 MED ORDER — ALPRAZOLAM 0.5 MG PO TABS
1.0000 mg | ORAL_TABLET | Freq: Once | ORAL | Status: AC
  Administered 2022-11-11: 1 mg via ORAL
  Filled 2022-11-11: qty 2

## 2022-11-11 MED ORDER — PERFLUTREN LIPID MICROSPHERE
1.0000 mL | INTRAVENOUS | Status: AC | PRN
  Administered 2022-11-11: 6 mL via INTRAVENOUS

## 2022-11-11 MED ORDER — POTASSIUM CHLORIDE CRYS ER 20 MEQ PO TBCR
40.0000 meq | EXTENDED_RELEASE_TABLET | Freq: Once | ORAL | Status: AC
  Administered 2022-11-11: 40 meq via ORAL
  Filled 2022-11-11: qty 2

## 2022-11-11 MED ORDER — CITALOPRAM HYDROBROMIDE 20 MG PO TABS
40.0000 mg | ORAL_TABLET | Freq: Every day | ORAL | Status: DC
  Administered 2022-11-11 – 2022-11-12 (×2): 40 mg via ORAL
  Filled 2022-11-11 (×2): qty 2

## 2022-11-11 NOTE — Progress Notes (Signed)
PHARMACY CONSULT NOTE - FOLLOW UP  Pharmacy Consult for Electrolyte Monitoring and Replacement   Recent Labs: Potassium (mmol/L)  Date Value  11/11/2022 3.3 (L)  05/22/2014 3.9   Magnesium (mg/dL)  Date Value  13/24/4010 2.2  05/23/2013 2.0   Calcium (mg/dL)  Date Value  27/25/3664 8.7 (L)   Calcium, Total (mg/dL)  Date Value  40/34/7425 9.1   Albumin (g/dL)  Date Value  95/63/8756 3.7  09/08/2022 4.0  12/12/2013 3.6   Phosphorus (mg/dL)  Date Value  43/32/9518 4.3   Sodium (mmol/L)  Date Value  11/11/2022 137  09/08/2022 137  05/22/2014 137     Assessment: 57 year old male PMH HLD, CAD, T2DM, chronic pain admitted with acute CVA. TNK given on 11/10/22.   Goal of Therapy:  Electrolytes within normal limits  Plan:  --Experienced burning with IV Kcl. Ordered Kcl PO x 2 doses --Follow up BMP, Mag tomorrow AM   Elliot Gurney, PharmD, BCPS Clinical Pharmacist  11/11/2022 11:23 AM

## 2022-11-11 NOTE — Progress Notes (Signed)
NAME:  Gerald [name], MRN:  [id number], DOB:  08/27/1965, LOS: 1 ADMISSION DATE:  11/10/2022, CONSULTATION DATE:  11/10/22 REFERRING MD:  Dr. Fuller Plan, CHIEF COMPLAINT: Weakness    History of Present Illness:  57 yo M presenting to [facility] ED from home via EMS on 11/10/22 for evaluation of Left sided hemiparesis & weakness.  History provided per chart review and bedside patient report. Patient reports being in his normal state of health until 14:30 when he suddenly noticed acute onset of Left sided numbness and weakness which started in his left leg and moved upwards to involve his entire left side, all the way up to the back of his head. He denied chest pain, dyspnea, abdominal pain, blurred vision, falls or LOC. He did endorse a mild headache 5/10 that started when EMS arrived and has continued to worsen. PMHx notes migraines however patient says this is NOT accurate and he dose not normally have headaches and does not take any medication for headaches. Patient denied tobacco use, ETOH or recreational drug use. He has not run out of any medication and has been taking everything as prescribed. He did note earlier this week his BP was 190's/100's.  ED course: Upon arrival patient CODE STROKE. Patient A&O, hypertensive but otherwise stable vitals on RA. Patient eligible for TNK, STAT neurology consult and the patient received a dose at 18:54. Initial imaging negative for abnormality except for moderate stenosis of the left and right PCA. Labs reassuring with mild leukocytosis- suspect reactive and mildly elevated ALT and glucose. Post TNK patient's headache worsened to 8/10. STAT repeat head CT obtained, negative for bleeding/any abnormality.  Medications given: tylenol, TNK Initial Vitals: 13, 78, 173/94 & 99% on RA Significant labs: (Labs/ Imaging personally reviewed)  Chemistry: Na+: 135, K+: 3.7, BUN/Cr.: 13/ 0.88, Serum CO2/ AG: 24/ 8, ALT: 52 Hematology: WBC: 10.6, Hgb: 14.9,   CT head wo  contrast 11/10/22: No hemorrhage or evidence of acute cortical infarct. Aspects is 10. CT angio head/neck 11/10/22: no LVO, moderate stenosis of the LEFT PCA origin and proximal P2 segment of the Riht PCA. No hemodynamically significant stenosis in the neck. No core infarct or ischemic penumbra on CT perfusion.  PCCM consulted for admission due to suspected acute CVA status post thrombolytic administration.  Pertinent  Medical History  HLD Non-obstructive CAD OSA on CPAP Chronic pain syndrome Carotid Stenosis T2DM HTN Morbid obesity  Significant Hospital Events: Including procedures, antibiotic start and stop dates in addition to other pertinent events   11/10/22: Admit to ICU with suspected acute ischemic CVA status post thrombolytic administration.  10/8 sp TNK ase NIH score now 0  Interim History / Subjective:  Alert and awake NAD No SOB no signs of bleeding Follow up neurology work up  Objective   Blood pressure 122/75, pulse 69, temperature 98.2 F (36.8 C), temperature source Oral, resp. rate 17, height 6\' 2"  (1.88 m), weight (!) 146.8 kg, SpO2 92%.    FiO2 (%):  [21 %] 21 %   Intake/Output Summary (Last 24 hours) at 11/11/2022 0706 Last data filed at 11/11/2022 0600 Gross per 24 hour  Intake 618.24 ml  Output 600 ml  Net 18.24 ml   Filed Weights   11/10/22 1842 11/10/22 2111  Weight: (!) 148.1 kg (!) 146.8 kg       Review of Systems: Gen:  Denies  fever, sweats, chills weight loss  HEENT: Denies blurred vision, double vision, ear pain, eye pain, hearing loss, nose bleeds, sore  throat Cardiac:  No dizziness, chest pain or heaviness, chest tightness,edema, No JVD Resp:   No cough, -sputum production, -shortness of breath,-wheezing, -hemoptysis,  Other:  All other systems negative   Physical Examination:   General Appearance: No distress  EYES PERRLA, EOM intact.   NECK Supple, No JVD Pulmonary: normal breath sounds, No wheezing.  CardiovascularNormal  S1,S2.  No m/r/g.   Abdomen: Benign, Soft, non-tender. Neurology UE/LE 5/5 strength, no focal deficits Ext pulses intact, cap refill intact ALL OTHER ROS ARE NEGATIVE   NIHSS Level of  Consciousness: 0 = Alert, keenly responsive; 1 = Not alert, but arousable by minor stimulation to obey, answer, or respond; 2 = Not alert, requires repeated stimulation to attend, or is obtunded and requires strong or painful stimulation to make movements (not stereotyped); 3 = Responds only with reflex motor or autonomic effects or totally unresponsive, flaccid, and areflexic   0  LOC Questions 0 = Answers both questions correctly; 1 = Answers one question correctly; 2 = Answers neither question correctly.  0  LOC Commands 0 = Performs both tasks correctly; 1 = Performs one task correctly; 2 = Performs neither task correctly  0  Best Gaze  0 = Normal; 1 = Partial gaze palsy; gaze is abnormal in one or both eyes, but forced deviation or total gaze paresis is not present; 2 = Forced deviation, or total gaze paresis not overcome by the oculocephalic maneuver   0  Visual 0 = No visual loss; 1 = Partial hemianopia; 2 = Complete hemianopia; 3 = Bilateral hemianopia (blind including cortical blindness).   0  Facial Palsy 0 = Normal symmetrical movements; 1 = Minor paralysis (flattened nasolabial fold, asymmetry on smiling); 2 = Partial paralysis (total or near-total paralysis of lower face); 3 = Complete paralysis of one or both sides (absence of facial movement in the upper and lower face).  0  Motor Arm LEFT 0 = No drift; limb holds 90 (or 45) degrees for full 10 secs; 1 = Drift; limb holds 90 (or 45) degrees, but drifts down before full 10 seconds; does not hit bed or other support; 2 = Some effort against gravity; limb cannot get to or maintain (if cued) 90 (or 45) degrees, drifts down to bed, but has some effort against gravity; 3 = No effort against gravity; limb falls; 4 = No movement; UN = unable to test (Amputation  or joint fusion).   0  Motor Arm RIGHT 0 = No drift; limb holds 90 (or 45) degrees for full 10 secs; 1 = Drift; limb holds 90 (or 45) degrees, but drifts down before full 10 seconds; does not hit bed or other support; 2 = Some effort against gravity; limb cannot get to or maintain (if cued) 90 (or 45) degrees, drifts down to bed, but has some effort against gravity; 3 = No effort against gravity; limb falls; 4 = No movement; UN = unable to test (Amputation or joint fusion).   0  Motor Leg LEFT 0 = No drift; leg holds 30 degree position for full 5 secs; 1 = Drift; leg falls by the end of the 5-sec period but does not hit bed; 2 = Some effort against gravity; leg falls to bed by 5 secs, but has some effort against gravity; 3 = No effort against gravity; leg falls to bed immediately; 4 = No movement; UN = unable to test (Amputation or joint fusion).   0  Motor Leg RIGHT 0 = No  drift; leg holds 30 degree position for full 5 secs; 1 = Drift; leg falls by the end of the 5-sec period but does not hit bed; 2 = Some effort against gravity; leg falls to bed by 5 secs, but has some effort against gravity; 3 = No effort against gravity; leg falls to bed immediately; 4 = No movement; UN = unable to test (Amputation or joint fusion).   0  Limb Ataxia 0 = Absent; 1 = Present in one limb; 2 = Present in two limbs; UN = Amputation or joint fusion   0  Sensory  0 = Normal, no sensory loss; 1 = Mild-to-moderate sensory loss, patient feels pinprick is less sharp or is dull on the affected side, or there is a loss of superficial pain with pinprick, but patient is aware of being touched; 2 = Severe to total sensory loss, patient is not aware of being touched in the face, arm, and leg.   0  Best Language 0 = No aphasia; normal; 1 = Mild-to-moderate aphasia; some obvious loss of fluency or facility of comprehension, w/o significant limitation on ideas expressed or form of expression. Reduction of speech and/or comprehension,  however, makes conversation about provided materials difficult or impossible. For example, in conversation about provided materials, examiner can identify picture or naming card content from patient's response; 2 = Severe aphasia; all communication is through fragmentary expression; great need for inference, questioning, and guessing by the listener. Range of information that can be exchanged is limited; listener carries burden of communication. Examiner cannot identify materials provided from patient response; 3 = Mute, global aphasia; no usable speech or auditory comprehension  0  Dysarthria 0 = Normal; 1 = Mild-to-moderate dysarthria, patient slurs at least some words and, at worst, can be understood with some difficulty; 2 = Severe dysarthria, patient's speech is so slurred as to be unintelligible in the absence of or out of proportion to any dysphasia, or is mute/anarthric; UN = Intubated or other physical barrier  0  Extinction/Inattention 0 = No abnormality 1 = Visual, tactile, auditory, spatial, or personal extinction/inattention to bilateral simultaneous stimulation in one of the sensory modalities. 2 = Profound hemi-inattention or extinction to more than one modality; does not recognize own hand or orients to only one side of space.  0  Total   0      Assessment & Plan:  Acute Ischemic Stroke s/p TNKase PMHx: CAD, HTN, HLD TNK administered: 18:54 - NIHSS & neuro checks Q 36m x 2h, q 77m x 6h, q 1h x 16 hours for 24 hours - follow up MRI 24 hours post  - perform bedside swallow > SLP eval if needed - PT/OT consult - Neurology consulted, appreciate input - Echocardiogram ordered - f/u Lipid panel & Hgb A1c  -SCD's for VTE prophylaxis, no anti-platelet therapy or anticoagulation until repeat imaging 24 hours post rules out hemorrhage - labetalol Q 2h PRN to maintain BP < 180/105, consider clevidipine drip if needed  Headache - schedule Tylenol - Reglan 10 mg x 1, Mg 2g x 1 -LR  infusion - VPA per neuro recs for continued headache - avoid opiates & fiorect   HTN - hold outpatient PO anti-hypertensive medications at this time - see above, use labetalol IVP and/or clevidipine drip   ENDO - ICU hypoglycemic\Hyperglycemia protocol -check FSBS per protocol   GI GI PROPHYLAXIS as indicated  NUTRITIONAL STATUS DIET-->as tolerated Constipation protocol as indicated   ELECTROLYTES -follow labs as needed -replace  as needed -pharmacy consultation and following   Best Practice (right click and "Reselect all SmartList Selections" daily)  Diet/type: NPO w/ oral meds DVT prophylaxis: SCD GI prophylaxis: PPI Lines: N/A Foley:  N/A Code Status:  full code   Labs   CBC: Recent Labs  Lab 11/10/22 1830 11/11/22 0344  WBC 10.6* 8.2  NEUTROABS 5.2  --   HGB 14.9 14.4  HCT 42.5 41.3  MCV 85.0 84.8  PLT 246 201    Basic Metabolic Panel: Recent Labs  Lab 11/10/22 1830 11/11/22 0344  NA 135 137  K 3.7 3.3*  CL 103 102  CO2 24 27  GLUCOSE 147* 136*  BUN 13 12  CREATININE 0.88 0.86  CALCIUM 9.0 8.7*  MG  --  2.2  PHOS  --  4.3   GFR: Estimated Creatinine Clearance: 146.5 mL/min (by C-G formula based on SCr of 0.86 mg/dL). Recent Labs  Lab 11/10/22 1830 11/11/22 0344  WBC 10.6* 8.2    Liver Function Tests: Recent Labs  Lab 11/10/22 1830 11/11/22 0344  AST 38 32  ALT 52* 48*  ALKPHOS 87 64  BILITOT 1.0 0.9  PROT 7.4 7.0  ALBUMIN 3.9 3.7   No results for input(s): "LIPASE", "AMYLASE" in the last 168 hours. No results for input(s): "AMMONIA" in the last 168 hours.  ABG    Component Value Date/Time   PHART 7.47 (H) 10/18/2015 0039   PCO2ART 40 10/18/2015 0039   PO2ART 72 (L) 10/18/2015 0039   HCO3 29.1 (H) 10/18/2015 0039   TCO2 24 07/17/2017 1647   O2SAT 95.0 10/18/2015 0054     Coagulation Profile: Recent Labs  Lab 11/10/22 1830  INR 1.1    Cardiac Enzymes: No results for input(s): "CKTOTAL", "CKMB",  "CKMBINDEX", "TROPONINI" in the last 168 hours.  HbA1C: Hemoglobin A1C  Date/Time Value Ref Range Status  09/01/2022 11:03 AM 9.5 (A) 4.0 - 5.6 % Final  12/30/2021 01:36 PM 6.1 (A) 4.0 - 5.6 % Final  05/23/2014 02:25 AM 7.1 (H) % Final    Comment:    4.0-6.0 NOTE: New Reference Range  04/11/14   09/13/2011 03:07 AM 6.8 (H) 4.2 - 6.3 % Final    Comment:    The American Diabetes Association recommends that a primary goal of therapy should be <7% and that physicians should reevaluate the treatment regimen in patients with HbA1c values consistently >8%.    Hgb A1c MFr Bld  Date/Time Value Ref Range Status  09/10/2021 08:42 AM 6.5 (H) 4.8 - 5.6 % Final    Comment:             Prediabetes: 5.7 - 6.4          Diabetes: >6.4          Glycemic control for adults with diabetes: <7.0   06/10/2017 05:33 AM 11.0 (H) 4.8 - 5.6 % Final    Comment:    (NOTE) Pre diabetes:          5.7%-6.4% Diabetes:              >6.4% Glycemic control for   <7.0% adults with diabetes     CBG: Recent Labs  Lab 11/10/22 1753 11/10/22 2331 11/11/22 0408  GLUCAP 146* 145* 134*     DVT/GI PRX  assessed I Assessed the need for Labs I Assessed the need for Foley I Assessed the need for Central Venous Line Family Discussion when available I Assessed the need for Mobilization I made an Assessment  of medications to be adjusted accordingly Safety Risk assessment completed  CASE DISCUSSED IN MULTIDISCIPLINARY ROUNDS WITH ICU TEAM     Critical Care Time devoted to patient care services described in this note is 45 minutes.    Lucie Leather, M.D.  [facility]  Medical Director [facility] Medical Director [facility] Cardio-Pulmonary Department

## 2022-11-11 NOTE — Progress Notes (Signed)
NEUROLOGY CONSULT FOLLOW UP NOTE   Date of service: November 11, 2022 Patient Name: Gerald [name] MRN:  [id number] DOB:  [birth year]  Brief HPI  Gerald [name] is a 57 y.o. male  has a past medical history of Abnormal LFTs, Anxiety, Atypical chest pain, Borderline diabetes, CAD (coronary artery disease), Carcinoma (HCC), Chronic pain syndrome, Depression, Fundic gland polyps of stomach, benign, Gastritis, GERD (gastroesophageal reflux disease), Hepatic steatosis, Hyperlipidemia, Hypertension, Migraines, Myocardial infarction (HCC) (2008), Nonischemic cardiomyopathy (HCC), Obesity, Occlusion and stenosis of bilateral carotid arteries, OSA on CPAP, Osteoarthritis of both knees, Shortness of breath, and Squamous cell carcinoma of skin (02/27/2020). who presented with left sided numbness and weakness.  Received TNK in ED.   Interval Hx/subjective   Patient with increased headache overnight.  Head CT repeated and showed no evidence of hemorrhage.  Patient reports feeling back to baseline at this time.   Vitals   Vitals:   11/11/22 0700 11/11/22 0800 11/11/22 0900 11/11/22 1000  BP: 134/68 118/66 (!) 144/68 (!) 140/67  Pulse: 66 69 74 88  Resp: 18 16 18 15   Temp:  97.6 F (36.4 C)    TempSrc:  Oral    SpO2: 94% 96% 94% 92%  Weight:      Height:         Body mass index is 41.55 kg/m.  Physical Exam   Constitutional: Appears well-developed and well-nourished.  Psych: Affect appropriate to situation.  Eyes: No scleral injection.  Head: Normocephalic.  Cardiovascular: Normal rate and regular rhythm.    Neurologic Examination   Mental Status: Alert, oriented, thought content appropriate.  Speech fluent without evidence of aphasia.  Able to follow 3 step commands without difficulty. Cranial Nerves: II: Visual fields grossly normal III,IV, VI: ptosis not present, extra-ocular motions intact bilaterally V,VII: smile symmetric, facial light touch sensation normal bilaterally VIII:  hearing normal bilaterally XI: bilateral shoulder shrug XII: midline tongue extension Motor: 5/5 throughout Sensory: Pinprick and light touch intact throughout, bilaterally Deep Tendon Reflexes: 2+ and symmetric with absent AJ's bilaterally Plantars: Right: mute   Left: mute  Labs and Diagnostic Imaging   CBC:  Recent Labs  Lab 11/10/22 1830 11/11/22 0344  WBC 10.6* 8.2  NEUTROABS 5.2  --   HGB 14.9 14.4  HCT 42.5 41.3  MCV 85.0 84.8  PLT 246 201    Basic Metabolic Panel:  Lab Results  Component Value Date   NA 137 11/11/2022   K 3.3 (L) 11/11/2022   CO2 27 11/11/2022   GLUCOSE 136 (H) 11/11/2022   BUN 12 11/11/2022   CREATININE 0.86 11/11/2022   CALCIUM 8.7 (L) 11/11/2022   GFRNONAA >60 11/11/2022   GFRAA 114 08/02/2019   Lipid Panel:  Lab Results  Component Value Date   LDLCALC 140 (H) 11/11/2022   HgbA1c:  Lab Results  Component Value Date   HGBA1C 9.5 (A) 09/01/2022   Urine Drug Screen:     Component Value Date/Time   LABOPIA NONE DETECTED 07/17/2017 1839   COCAINSCRNUR NONE DETECTED 07/17/2017 1839   COCAINSCRNUR NONE DETECTED 10/17/2015 2209   LABBENZ NONE DETECTED 07/17/2017 1839   AMPHETMU NONE DETECTED 07/17/2017 1839   THCU NONE DETECTED 07/17/2017 1839   LABBARB (A) 07/17/2017 1839    Result not available. Reagent lot number recalled by manufacturer.    Alcohol Level     Component Value Date/Time   [facility] <10 11/10/2022 1830   INR  Lab Results  Component Value Date   INR 1.1  11/10/2022   APTT  Lab Results  Component Value Date   APTT 29 11/10/2022   AED levels: No results found for: "PHENYTOIN", "ZONISAMIDE", "LAMOTRIGINE", "LEVETIRACETA"  CT Head without contrast(Personally reviewed): CT HEAD WITHOUT CONTRAST   TECHNIQUE: Contiguous axial images were obtained from the base of the skull through the vertex without intravenous contrast.   RADIATION DOSE REDUCTION: This exam was performed according to the departmental  dose-optimization program which includes automated exposure control, adjustment of the mA and/or kV according to patient size and/or use of iterative reconstruction technique.   COMPARISON:  None Available.   FINDINGS: Brain: No evidence of acute infarction, hemorrhage, hydrocephalus, extra-axial collection or mass lesion/mass effect.   Vascular: No hyperdense vessel or unexpected calcification.   Skull: Normal. Negative for fracture or focal lesion.   Sinuses/Orbits: No acute finding.   Other: None.   IMPRESSION: No acute intracranial abnormality.     MRI Brain: pending   Impression   Gerald [name] is a 57 y.o. male with a past medical history of Abnormal LFTs, Anxiety, Atypical chest pain, Borderline diabetes, CAD (coronary artery disease), Carcinoma (HCC), Chronic pain syndrome, Depression, Fundic gland polyps of stomach, benign, Gastritis, GERD (gastroesophageal reflux disease), Hepatic steatosis, Hyperlipidemia, Hypertension, Migraines, Myocardial infarction (HCC) (2008), Nonischemic cardiomyopathy (HCC), Obesity, Occlusion and stenosis of bilateral carotid arteries, OSA on CPAP, Osteoarthritis of both knees, Shortness of breath, and Squamous cell carcinoma of skin (02/27/2020) who presented with left sided numbness and weakness.  Received TNK in ED.  Today back to baseline.  LDL 140   Recommendations  1. High intensity statin initiation 2. MRI brain without contrast pending 3. PT consult, OT consult, Speech consult 4. Echocardiogram pending 5. No antiplatelet or anticoagulation therapy until 24-hour imaging performed and hemorrhage ruled out 6. BP control<180/105   7. Telemetry monitoring 8. Frequent neuro checks as per post thrombolytic order set 9. Repeat CT imaging without contrast to be performed 24 hours after thrombolytic administration, unless MRI is performed at that time. 10. If acute worsening noted patient to have stat head CT without contrast and  neurology to be contacted 11. HgbA1c.  Goal<7.0 ______________________________________________________________________   Thank you for the opportunity to take part in the care of this patient. If you have any further questions, please contact the neurology consultation team on call. Updated oncall schedule is listed on AMION.  Signed,  Doneen Ollinger

## 2022-11-11 NOTE — Progress Notes (Signed)
PT Cancellation Note  Patient Details Name: Gerald [name] MRN: [id number] DOB: 25-Jun-1965   Cancelled Treatment:    Reason Eval/Treat Not Completed: Medical issues which prohibited therapy: Consult received and chart reviewed.  Patient admitted to ICU for CVA work up s/p TNK (11/10/22 at 1854). Per guidelines, to be on strict bedrest x24 hours post infusion and follow up imaging required to be completed prior to initiation of therapy.  Will continue to follow and initiate as medically appropriate.    Ovidio Hanger PT, DPT 11/11/22, 9:29 AM

## 2022-11-11 NOTE — Progress Notes (Signed)
OT Cancellation Note  Patient Details Name: Gerald [name] MRN: [id number] DOB: March 22, 1965   Cancelled Treatment:    Reason Eval/Treat Not Completed: Active bedrest order. Consult received and chart reviewed.  Patient admitted to ICU for CVA work up s/p TNK (11/10/22 at 1854). Per guidelines, to be on strict bedrest x24 hours post infusion and follow up imaging required to be completed prior to initiation of therapy.  Will continue to follow and initiate as medically appropriate.  Kathie Dike, M.S. OTR/L  11/11/22, 8:32 AM  ascom [phone]

## 2022-11-11 NOTE — Progress Notes (Signed)
*  PRELIMINARY RESULTS* Echocardiogram 2D Echocardiogram has been performed.  Carolyne Fiscal 11/11/2022, 2:26 PM

## 2022-11-11 NOTE — Plan of Care (Signed)
  Problem: Education: Goal: Knowledge of disease or condition will improve Outcome: Progressing Goal: Knowledge of secondary prevention will improve (MUST DOCUMENT ALL) Outcome: Progressing Goal: Knowledge of patient specific risk factors will improve Loraine Leriche N/A or DELETE if not current risk factor) Outcome: Progressing   Problem: Ischemic Stroke/TIA Tissue Perfusion: Goal: Complications of ischemic stroke/TIA will be minimized Outcome: Progressing   Problem: Coping: Goal: Will verbalize positive feelings about self Outcome: Progressing Goal: Will identify appropriate support needs Outcome: Progressing   Problem: Self-Care: Goal: Ability to participate in self-care as condition permits will improve Outcome: Progressing   Problem: Nutrition: Goal: Risk of aspiration will decrease Outcome: Progressing

## 2022-11-11 NOTE — Progress Notes (Signed)
SLP Cancellation Note  Patient Details Name: Gerald [name] MRN: [id number] DOB: [birth year]   Cancelled treatment:       Reason Eval/Treat Not Completed: SLP screened, no needs identified, will sign off (chart reviewed; consulted NSG then met w/ pt and Wife in room.)  Pt denied any difficulty swallowing and is currently on a regular diet; tolerates swallowing pills w/ water per NSG.  Pt conversed in conversation w/out expressive/receptive deficits noted; pt denied any speech-language deficits. Wife agreed. Speech clear. Pt called the Kitchen and ordered his breakfast meal independently while in the room.  No further skilled ST services indicated as pt appears at his communication baseline. Pt agreed. NSG to reconsult if any change in status while admitted.       Jerilynn Som, MS, CCC-SLP Speech Language Pathologist Rehab Services; [facility] [phone] (ascom) Prim Morace 11/11/2022, 8:45 AM

## 2022-11-11 NOTE — Plan of Care (Signed)
A&O patient admitted to ICU19 last night for CVA with left sided weakness. Patient received TNK at 1754 and arrived to ICU19 at 2111; NIH was 4 on arrival to ICU; patient has continued to improve, NIH is now 0.  Patient denies pain, dizziness, or headache. IVF infusing per order. Patient voiding clear yellow urine via urinal. CBG's requiring the lowest dose of insulin. Moderate fall risk. Call light within reach. Education provided. Pt verbalized understanding.  Problem: Education: Goal: Knowledge of disease or condition will improve Outcome: Progressing   Problem: Ischemic Stroke/TIA Tissue Perfusion: Goal: Complications of ischemic stroke/TIA will be minimized Outcome: Progressing   Problem: Coping: Goal: Will verbalize positive feelings about self Outcome: Progressing Goal: Will identify appropriate support needs Outcome: Progressing   Problem: Health Behavior/Discharge Planning: Goal: Ability to manage health-related needs will improve Outcome: Progressing Goal: Goals will be collaboratively established with patient/family Outcome: Progressing   Problem: Education: Goal: Knowledge of General Education information will improve Description: Including pain rating scale, medication(s)/side effects and non-pharmacologic comfort measures Outcome: Progressing   Problem: Clinical Measurements: Goal: Ability to maintain clinical measurements within normal limits will improve Outcome: Progressing Goal: Will remain free from infection Outcome: Progressing Goal: Diagnostic test results will improve Outcome: Progressing Goal: Respiratory complications will improve Outcome: Progressing Goal: Cardiovascular complication will be avoided Outcome: Progressing   Problem: Elimination: Goal: Will not experience complications related to bowel motility Outcome: Progressing Goal: Will not experience complications related to urinary retention Outcome: Progressing   Problem: Pain  Managment: Goal: General experience of comfort will improve Outcome: Progressing   Problem: Safety: Goal: Ability to remain free from injury will improve Outcome: Progressing   Problem: Skin Integrity: Goal: Risk for impaired skin integrity will decrease Outcome: Progressing

## 2022-11-12 ENCOUNTER — Other Ambulatory Visit: Payer: Self-pay

## 2022-11-12 LAB — COMPREHENSIVE METABOLIC PANEL
ALT: 43 U/L (ref 0–44)
AST: 27 U/L (ref 15–41)
Albumin: 3.7 g/dL (ref 3.5–5.0)
Alkaline Phosphatase: 76 U/L (ref 38–126)
Anion gap: 8 (ref 5–15)
BUN: 15 mg/dL (ref 6–20)
CO2: 26 mmol/L (ref 22–32)
Calcium: 8.7 mg/dL — ABNORMAL LOW (ref 8.9–10.3)
Chloride: 101 mmol/L (ref 98–111)
Creatinine, Ser: 0.87 mg/dL (ref 0.61–1.24)
GFR, Estimated: >60 mL/min (ref >60)
Glucose, Bld: 186 mg/dL — ABNORMAL HIGH (ref 70–99)
Potassium: 3.5 mmol/L (ref 3.5–5.1)
Sodium: 135 mmol/L (ref 135–145)
Total Bilirubin: 0.9 mg/dL (ref 0.3–1.2)
Total Protein: 6.7 g/dL (ref 6.5–8.1)

## 2022-11-12 LAB — HEMOGLOBIN A1C
Hgb A1c MFr Bld: 8.4 % — ABNORMAL HIGH (ref 4.8–5.6)
Mean Plasma Glucose: 194.38 mg/dL

## 2022-11-12 LAB — CBC
HCT: 40.5 % (ref 39.0–52.0)
Hemoglobin: 14.1 g/dL (ref 13.0–17.0)
MCH: 30 pg (ref 26.0–34.0)
MCHC: 34.8 g/dL (ref 30.0–36.0)
MCV: 86.2 fL (ref 80.0–100.0)
Platelets: 198 10*3/uL (ref 150–400)
RBC: 4.7 MIL/uL (ref 4.22–5.81)
RDW: 13.1 % (ref 11.5–15.5)
WBC: 7.9 10*3/uL (ref 4.0–10.5)
nRBC: 0 % (ref 0.0–0.2)

## 2022-11-12 LAB — PHOSPHORUS: Phosphorus: 4.2 mg/dL (ref 2.5–4.6)

## 2022-11-12 LAB — GLUCOSE, CAPILLARY
Glucose-Capillary: 182 mg/dL — ABNORMAL HIGH (ref 70–99)
Glucose-Capillary: 182 mg/dL — ABNORMAL HIGH (ref 70–99)

## 2022-11-12 LAB — MAGNESIUM: Magnesium: 2 mg/dL (ref 1.7–2.4)

## 2022-11-12 MED ORDER — ASPIRIN 81 MG PO TBEC
81.0000 mg | DELAYED_RELEASE_TABLET | Freq: Every day | ORAL | Status: DC
Start: 2022-11-12 — End: 2023-04-17

## 2022-11-12 MED ORDER — EZETIMIBE 10 MG PO TABS
10.0000 mg | ORAL_TABLET | Freq: Every day | ORAL | 0 refills | Status: DC
  Filled 2022-11-12: qty 30, 30d supply, fill #0

## 2022-11-12 MED ORDER — CLOPIDOGREL BISULFATE 75 MG PO TABS
75.0000 mg | ORAL_TABLET | Freq: Every day | ORAL | 0 refills | Status: AC
  Filled 2022-11-12: qty 21, 21d supply, fill #0

## 2022-11-12 MED ORDER — GABAPENTIN 300 MG PO CAPS: 300.0000 mg | ORAL_CAPSULE | Freq: Two times a day (BID) | ORAL | Status: DC

## 2022-11-12 MED ORDER — ENOXAPARIN SODIUM 40 MG/0.4ML IJ SOSY: 40.0000 mg | PREFILLED_SYRINGE | INTRAMUSCULAR | Status: DC

## 2022-11-12 MED ORDER — CLOPIDOGREL BISULFATE 75 MG PO TABS
75.0000 mg | ORAL_TABLET | Freq: Every day | ORAL | 0 refills | Status: DC
Start: 2022-11-12 — End: 2022-11-12

## 2022-11-12 NOTE — Plan of Care (Signed)
  Problem: Education: Goal: Knowledge of disease or condition will improve Outcome: Progressing Goal: Knowledge of secondary prevention will improve (MUST DOCUMENT ALL) Outcome: Progressing Goal: Knowledge of patient specific risk factors will improve Gerald [name] N/A or DELETE if not current risk factor) Outcome: Progressing   Problem: Ischemic Stroke/TIA Tissue Perfusion: Goal: Complications of ischemic stroke/TIA will be minimized Outcome: Progressing   Problem: Self-Care: Goal: Ability to participate in self-care as condition permits will improve Outcome: Progressing Goal: Verbalization of feelings and concerns over difficulty with self-care will improve Outcome: Progressing

## 2022-11-12 NOTE — Progress Notes (Signed)
PT Cancellation Note  Patient Details Name: Gerald [name] MRN: [id number] DOB: 1965/04/09   Cancelled Treatment:    Reason Eval/Treat Not Completed: PT screened, no needs identified, will sign off: Per OT pt independent with all functional tasks and is at his functional baseline with patient verbally confirming no skilled PT needs and declining PT evaluation. Will complete PT orders at this time but will reassess pt pending a change in status upon receipt of new PT orders.   Ovidio Hanger PT, DPT 11/12/22, 8:58 AM

## 2022-11-12 NOTE — Progress Notes (Signed)
Patrick Jupiter to be D/C'd Home per MD order.  Discussed prescriptions and follow up appointments with the patient. Prescriptions sent to Medication management clinic, medication list explained in detail. Pt verbalized understanding.   Allergies as of 11/12/2022       Reactions   Statins Hives, Rash        Medication List     STOP taking these medications    hydrOXYzine 25 MG capsule Commonly known as: VISTARIL   Testosterone 20.25 MG/ACT (1.62%) Gel       TAKE these medications    aspirin EC 81 MG tablet Take 1 tablet (81 mg total) by mouth daily. Swallow whole.   citalopram 40 MG tablet Commonly known as: CELEXA TAKE 1 TABLET BY MOUTH EVERY DAY   clopidogrel 75 MG tablet Commonly known as: Plavix Take 1 tablet (75 mg total) by mouth daily for 21 days.   ezetimibe 10 MG tablet Commonly known as: ZETIA Take 1 tablet (10 mg total) by mouth daily. Start taking on: November 13, 2022   furosemide 20 MG tablet Commonly known as: LASIX TAKE 1 TABLET BY MOUTH EVERY DAY AS NEEDED FOR LEG SWELLING   gabapentin 300 MG capsule Commonly known as: NEURONTIN Take 1 capsule (300 mg total) by mouth 2 (two) times daily.   lisinopril-hydrochlorothiazide 20-12.5 MG tablet Commonly known as: ZESTORETIC Take 2 tablets by mouth daily.   MULTIVITAMIN ADULTS PO Take 1 tablet by mouth daily.   nitroGLYCERIN 0.4 MG SL tablet Commonly known as: NITROSTAT Place 1 tablet (0.4 mg total) under the tongue every 5 (five) minutes x 3 doses as needed for chest pain.   Semaglutide(0.25 or 0.5MG /DOS) 2 MG/3ML Sopn Inject 0.5 mg into the skin once a week.        Vitals:   11/12/22 0900 11/12/22 1100  BP: (!) 168/93 (!) 172/87  Pulse: 72 70  Resp: 19 18  Temp:    SpO2: 94% 97%    Skin clean, dry and intact without evidence of skin break down, no evidence of skin tears noted. IV catheter discontinued intact. Site without signs and symptoms of complications. Dressing and pressure  applied. Pt denies pain at this time. No complaints noted.  An After Visit Summary was printed and given to the patient. Patient escorted via WC, and D/C home via private auto.  Rigoberto Noel

## 2022-11-12 NOTE — Progress Notes (Signed)
OT Cancellation Note  Patient Details Name: Gerald [name] MRN: [id number] DOB: 1965/11/21   Cancelled Treatment:    Reason Eval/Treat Not Completed: OT screened, no needs identified, will sign off. Order received, chart reviewed. Pt back to baseline functional independence. No skilled OT needs identified. Will sign off. Please re-consult if additional needs arise.   Kathie Dike, M.S. OTR/L  11/12/22, 8:57 AM  ascom [phone]

## 2022-11-12 NOTE — Progress Notes (Addendum)
NEUROLOGY CONSULT FOLLOW UP NOTE   Date of service: November 12, 2022 Patient Name: Gerald [name] MRN:  [id number] DOB:  [birth year]  Brief HPI  Gerald [name] is a 57 y.o. male  has a past medical history of Abnormal LFTs, Anxiety, Atypical chest pain, Borderline diabetes, CAD (coronary artery disease), Carcinoma (HCC), Chronic pain syndrome, Depression, Fundic gland polyps of stomach, benign, Gastritis, GERD (gastroesophageal reflux disease), Hepatic steatosis, Hyperlipidemia, Hypertension, Migraines, Myocardial infarction (HCC) (2008), Nonischemic cardiomyopathy (HCC), Obesity, Occlusion and stenosis of bilateral carotid arteries, OSA on CPAP, Osteoarthritis of both knees, Shortness of breath, and Squamous cell carcinoma of skin (02/27/2020). who presented with left sided numbness and weakness.  Received TNK in ED.    Interval Hx/subjective   Patient reports being back to baseline.  No new focal complaints or complaints of headache Vitals   Vitals:   11/12/22 0500 11/12/22 0600 11/12/22 0700 11/12/22 0800  BP: 136/61 (!) 144/77 133/75 127/62  Pulse: 72 71 74 87  Resp: 18 (!) 23  12  Temp:    97.9 F (36.6 C)  TempSrc:    Oral  SpO2: 95% 95% 96% 97%  Weight:      Height:         Body mass index is 41.55 kg/m.  Physical Exam   Constitutional: Appears well-developed and well-nourished.  Psych: Affect appropriate to situation.  Eyes: No scleral injection.  Head: Normocephalic.  Cardiovascular: Normal rate and regular rhythm.  Skin: WDI.   Neurologic Examination   Mental Status: Alert, oriented, thought content appropriate.  Speech fluent without evidence of aphasia.  Able to follow 3 step commands without difficulty. Cranial Nerves: II: Visual fields grossly normal III,IV, VI: ptosis not present, extra-ocular motions intact bilaterally V,VII: smile symmetric, facial light touch sensation normal bilaterally VIII: hearing normal bilaterally XI: bilateral shoulder  shrug XII: midline tongue extension Motor: 5/5 throughout Sensory: Pinprick and light touch intact throughout, bilaterally Cerebellar: normal finger-to-nose and normal heel-to-shin testing bilaterally   Labs and Diagnostic Imaging   CBC:  Recent Labs  Lab 11/10/22 1830 11/11/22 0344 11/12/22 0449  WBC 10.6* 8.2 7.9  NEUTROABS 5.2  --   --   HGB 14.9 14.4 14.1  HCT 42.5 41.3 40.5  MCV 85.0 84.8 86.2  PLT 246 201 198    Basic Metabolic Panel:  Lab Results  Component Value Date   NA 135 11/12/2022   K 3.5 11/12/2022   CO2 26 11/12/2022   GLUCOSE 186 (H) 11/12/2022   BUN 15 11/12/2022   CREATININE 0.87 11/12/2022   CALCIUM 8.7 (L) 11/12/2022   GFRNONAA >60 11/12/2022   GFRAA 114 08/02/2019   Lipid Panel:  Lab Results  Component Value Date   LDLCALC 140 (H) 11/11/2022   HgbA1c:  Lab Results  Component Value Date   HGBA1C 9.5 (A) 09/01/2022   Urine Drug Screen:     Component Value Date/Time   LABOPIA NONE DETECTED 07/17/2017 1839   COCAINSCRNUR NONE DETECTED 07/17/2017 1839   COCAINSCRNUR NONE DETECTED 10/17/2015 2209   LABBENZ NONE DETECTED 07/17/2017 1839   AMPHETMU NONE DETECTED 07/17/2017 1839   THCU NONE DETECTED 07/17/2017 1839   LABBARB (A) 07/17/2017 1839    Result not available. Reagent lot number recalled by manufacturer.    Alcohol Level     Component Value Date/Time   [facility] <10 11/10/2022 1830   INR  Lab Results  Component Value Date   INR 1.1 11/10/2022   APTT  Lab Results  Component Value Date   APTT 29 11/10/2022   AED levels: No results found for: "PHENYTOIN", "ZONISAMIDE", "LAMOTRIGINE", "LEVETIRACETA"  MRI Brain(Personally reviewed): MRI HEAD WITHOUT CONTRAST   TECHNIQUE: Multiplanar, multiecho pulse sequences of the brain and surrounding structures were obtained without intravenous contrast.   COMPARISON:  Prior CTs from 11/10/2022.   FINDINGS: Brain: Cerebral volume within normal limits. Patchy T2/FLAIR hyperintensity  involving the periventricular white matter, most characteristic of chronic microvascular ischemic disease, mild in nature.   1.1 cm acute ischemic nonhemorrhagic lacunar infarct present within the right thalamus. No associated mass effect. No other evidence for acute or subacute ischemia. No acute or chronic intracranial blood products.   No mass lesion, midline shift or mass effect. No hydrocephalus or extra-axial fluid collection. Pituitary gland and suprasellar region within normal limits.   Vascular: Major intracranial vascular flow voids are maintained.   Skull and upper cervical spine: Craniocervical junction normal. Bone marrow signal intensity within normal limits. No scalp soft tissue abnormality.   Sinuses/Orbits: Globes and orbital soft tissues within normal limits. Paranasal sinuses are clear. No significant mastoid effusion.   Other: None.   IMPRESSION: 1. 1.1 cm acute ischemic nonhemorrhagic right thalamic lacunar infarct. 2. Underlying mild chronic microvascular ischemic disease.   Impression   Gerald [name] is a 57 y.o. male with a past medical history of Abnormal LFTs, Anxiety, Atypical chest pain, Borderline diabetes, CAD (coronary artery disease), Carcinoma (HCC), Chronic pain syndrome, Depression, Fundic gland polyps of stomach, benign, Gastritis, GERD (gastroesophageal reflux disease), Hepatic steatosis, Hyperlipidemia, Hypertension, Migraines, Myocardial infarction (HCC) (2008), Nonischemic cardiomyopathy (HCC), Obesity, Occlusion and stenosis of bilateral carotid arteries, OSA on CPAP, Osteoarthritis of both knees, Shortness of breath, and Squamous cell carcinoma of skin (02/27/2020) who presented with left sided numbness and weakness.  Received TNK in ED.  Back to baseline. BP normal.  LDL 140.  A1c pending.  May follow up on an outpatient basis.  Patient recently noncompliant with medications due to being unable to fill prescription.  Echocardiogram with  EF of 50-55% with no cardiac source of emboli identified.  Etiology of infarct likely small vessel disease due to poor control of multiple vascular risk factors.    Recommendations  Patient allergic to statins.  Would have evaluated on an outpatient basis for appropriateness of PCSK9 inhibitor Start ASA 81mg  and Plavix 75mg  daily to be continued for 21 days Patient to follow up with neurology on an outpatient basis ______________________________________________________________________   Thank you for the opportunity to take part in the care of this patient. If you have any further questions, please contact the neurology consultation team on call. Updated oncall schedule is listed on AMION.  Signed,  Ada Woodbury

## 2022-11-12 NOTE — Progress Notes (Signed)
PHARMACY CONSULT NOTE - FOLLOW UP  Pharmacy Consult for Electrolyte Monitoring and Replacement   Recent Labs: Potassium (mmol/L)  Date Value  11/12/2022 3.5  05/22/2014 3.9   Magnesium (mg/dL)  Date Value  21/30/8657 2.0  05/23/2013 2.0   Calcium (mg/dL)  Date Value  84/69/6295 8.7 (L)   Calcium, Total (mg/dL)  Date Value  28/41/3244 9.1   Albumin (g/dL)  Date Value  02/05/7251 3.7  09/08/2022 4.0  12/12/2013 3.6   Phosphorus (mg/dL)  Date Value  66/44/0347 4.2   Sodium (mmol/L)  Date Value  11/12/2022 135  09/08/2022 137  05/22/2014 137     Assessment: 57 year old male PMH HLD, CAD, T2DM, chronic pain admitted with acute CVA. TNK given on 11/10/22.   Goal of Therapy:  Electrolytes within normal limits  Plan:  --No replacement warranted --Follow up BMP, Mag tomorrow AM   Elliot Gurney, PharmD, BCPS Clinical Pharmacist  11/12/2022 7:41 AM

## 2022-11-12 NOTE — Discharge Summary (Signed)
Physician Discharge Summary   Patient: Gerald [name] MRN: [id number] DOB: 1966-02-02  Admit date:     11/10/2022  Discharge date: 11/12/22  Discharge Physician: Lurene Shadow   PCP: Lyndon Code, MD   Recommendations at discharge:    Follow up with PCP in 2 weeks Follow up with [facility] in 1 month  Discharge Diagnoses: Principal Problem:   Acute CVA (cerebrovascular accident) [facility])  Resolved Problems:   * No resolved hospital problems. [facility] Course:  Mr. Gerald [name] is a 57 year old man with medical history significant for hypertension, hyperlipidemia, anxiety, OSA, type II DM, peripheral neuropathy, carotid artery stenosis, who presented to the hospital with left-sided weakness and numbness.  Symptoms started in the left leg and moved upwards affecting the entire left side of his body.  CT head did not show any acute abnormality.  He was evaluated by tele neurologist in the ED.  He was given tPA for acute stroke and admitted to the ICU for further management.  Follow-up MRI reviewed acute ischemic right thalamic lacunar stroke.  Dr. Thad Ranger, neurologist, recommended dual antiplatelet therapy with aspirin and Plavix for 21 days followed by low-dose aspirin monotherapy.  He was not treated with statins because he is allergic to statins.  His condition has improved and he is deemed stable for discharge to home today.  He has been able to ambulate in the hallway without any problems.  He said all his symptoms have resolved.  He will follow-up with neurologist in the outpatient setting for consideration of PCSK9 inhibitor.  He will be discharged on ezetimibe for hyperlipidemia.  Discharge plan was discussed with Dr. Thad Ranger, neurologist.         Consultants: Neurologist Procedures performed: None Disposition: Home Diet recommendation:  Discharge Diet Orders (From admission, onward)     Start     Ordered   11/12/22 0000  Diet - low sodium heart healthy         11/12/22 1014           Cardiac diet DISCHARGE MEDICATION: Allergies as of 11/12/2022       Reactions   Statins Hives, Rash        Medication List     STOP taking these medications    hydrOXYzine 25 MG capsule Commonly known as: VISTARIL   Testosterone 20.25 MG/ACT (1.62%) Gel       TAKE these medications    aspirin EC 81 MG tablet Take 1 tablet (81 mg total) by mouth daily. Swallow whole.   citalopram 40 MG tablet Commonly known as: CELEXA TAKE 1 TABLET BY MOUTH EVERY DAY   clopidogrel 75 MG tablet Commonly known as: Plavix Take 1 tablet (75 mg total) by mouth daily for 21 days.   furosemide 20 MG tablet Commonly known as: LASIX TAKE 1 TABLET BY MOUTH EVERY DAY AS NEEDED FOR LEG SWELLING   gabapentin 300 MG capsule Commonly known as: NEURONTIN Take 1 capsule (300 mg total) by mouth 2 (two) times daily.   lisinopril-hydrochlorothiazide 20-12.5 MG tablet Commonly known as: ZESTORETIC Take 2 tablets by mouth daily.   MULTIVITAMIN ADULTS PO Take 1 tablet by mouth daily.   nitroGLYCERIN 0.4 MG SL tablet Commonly known as: NITROSTAT Place 1 tablet (0.4 mg total) under the tongue every 5 (five) minutes x 3 doses as needed for chest pain.   Semaglutide(0.25 or 0.5MG /DOS) 2 MG/3ML Sopn Inject 0.5 mg into the skin once a week.  Follow-up Information     [facility]. Schedule an appointment as soon as possible for a visit in 1 month(s).   Contact information: [address] [place] [address] [phone]               Discharge Exam: Gerald [name] Weights   11/10/22 1842 11/10/22 2111  Weight: (!) 148.1 kg (!) 146.8 kg   GEN: NAD SKIN: Warm and dry EYES: No pallor or icterus ENT: MMM CV: RRR PULM: CTA B ABD: soft, obese, NT, +BS CNS: AAO x 3, non focal EXT: No edema or tenderness   Condition at discharge: good  The results of significant diagnostics from this hospitalization  (including imaging, microbiology, ancillary and laboratory) are listed below for reference.   Imaging Studies: MR BRAIN WO CONTRAST  Result Date: 11/11/2022 CLINICAL DATA:  Follow-up examination for stroke. EXAM: MRI HEAD WITHOUT CONTRAST TECHNIQUE: Multiplanar, multiecho pulse sequences of the brain and surrounding structures were obtained without intravenous contrast. COMPARISON:  Prior CTs from 11/10/2022. FINDINGS: Brain: Cerebral volume within normal limits. Patchy T2/FLAIR hyperintensity involving the periventricular white matter, most characteristic of chronic microvascular ischemic disease, mild in nature. 1.1 cm acute ischemic nonhemorrhagic lacunar infarct present within the right thalamus. No associated mass effect. No other evidence for acute or subacute ischemia. No acute or chronic intracranial blood products. No mass lesion, midline shift or mass effect. No hydrocephalus or extra-axial fluid collection. Pituitary gland and suprasellar region within normal limits. Vascular: Major intracranial vascular flow voids are maintained. Skull and upper cervical spine: Craniocervical junction normal. Bone marrow signal intensity within normal limits. No scalp soft tissue abnormality. Sinuses/Orbits: Globes and orbital soft tissues within normal limits. Paranasal sinuses are clear. No significant mastoid effusion. Other: None. IMPRESSION: 1. 1.1 cm acute ischemic nonhemorrhagic right thalamic lacunar infarct. 2. Underlying mild chronic microvascular ischemic disease. Electronically Signed   By: Rise Mu M.D.   On: 11/11/2022 19:09   ECHOCARDIOGRAM COMPLETE  Result Date: 11/11/2022    ECHOCARDIOGRAM REPORT   Patient Name:   Gerald [name] Date of Exam: 11/11/2022 Medical Rec #:  [id number]         Height:       74.0 in Accession #:    [id number]        Weight:       323.6 lb Date of Birth:  May 02, 1965         BSA:          2.667 m Patient Age:    57 years          BP:           144/72 mmHg  Patient Gender: M                 HR:           75 bpm. Exam Location:  [facility] Procedure: 2D Echo, Cardiac Doppler, Color Doppler and Intracardiac            Opacification Agent Indications:     Stroke  History:         Patient has prior history of Echocardiogram examinations, most                  recent 04/09/2022. Cardiomyopathy, Stroke, Signs/Symptoms:Chest                  Pain; Risk Factors:Hypertension, Diabetes, Dyslipidemia and  Follow-up Information     [facility]. Schedule an appointment as soon as possible for a visit in 1 month(s).   Contact information: [address] [place] [address] [phone]               Discharge Exam: Gerald [name] Weights   11/10/22 1842 11/10/22 2111  Weight: (!) 148.1 kg (!) 146.8 kg   GEN: NAD SKIN: Warm and dry EYES: No pallor or icterus ENT: MMM CV: RRR PULM: CTA B ABD: soft, obese, NT, +BS CNS: AAO x 3, non focal EXT: No edema or tenderness   Condition at discharge: good  The results of significant diagnostics from this hospitalization  (including imaging, microbiology, ancillary and laboratory) are listed below for reference.   Imaging Studies: MR BRAIN WO CONTRAST  Result Date: 11/11/2022 CLINICAL DATA:  Follow-up examination for stroke. EXAM: MRI HEAD WITHOUT CONTRAST TECHNIQUE: Multiplanar, multiecho pulse sequences of the brain and surrounding structures were obtained without intravenous contrast. COMPARISON:  Prior CTs from 11/10/2022. FINDINGS: Brain: Cerebral volume within normal limits. Patchy T2/FLAIR hyperintensity involving the periventricular white matter, most characteristic of chronic microvascular ischemic disease, mild in nature. 1.1 cm acute ischemic nonhemorrhagic lacunar infarct present within the right thalamus. No associated mass effect. No other evidence for acute or subacute ischemia. No acute or chronic intracranial blood products. No mass lesion, midline shift or mass effect. No hydrocephalus or extra-axial fluid collection. Pituitary gland and suprasellar region within normal limits. Vascular: Major intracranial vascular flow voids are maintained. Skull and upper cervical spine: Craniocervical junction normal. Bone marrow signal intensity within normal limits. No scalp soft tissue abnormality. Sinuses/Orbits: Globes and orbital soft tissues within normal limits. Paranasal sinuses are clear. No significant mastoid effusion. Other: None. IMPRESSION: 1. 1.1 cm acute ischemic nonhemorrhagic right thalamic lacunar infarct. 2. Underlying mild chronic microvascular ischemic disease. Electronically Signed   By: Rise Mu M.D.   On: 11/11/2022 19:09   ECHOCARDIOGRAM COMPLETE  Result Date: 11/11/2022    ECHOCARDIOGRAM REPORT   Patient Name:   Gerald [name] Date of Exam: 11/11/2022 Medical Rec #:  [id number]         Height:       74.0 in Accession #:    [id number]        Weight:       323.6 lb Date of Birth:  May 02, 1965         BSA:          2.667 m Patient Age:    57 years          BP:           144/72 mmHg  Patient Gender: M                 HR:           75 bpm. Exam Location:  [facility] Procedure: 2D Echo, Cardiac Doppler, Color Doppler and Intracardiac            Opacification Agent Indications:     Stroke  History:         Patient has prior history of Echocardiogram examinations, most                  recent 04/09/2022. Cardiomyopathy, Stroke, Signs/Symptoms:Chest                  Pain; Risk Factors:Hypertension, Diabetes, Dyslipidemia and  Physician Discharge Summary   Patient: Gerald [name] MRN: [id number] DOB: 1966-02-02  Admit date:     11/10/2022  Discharge date: 11/12/22  Discharge Physician: Lurene Shadow   PCP: Lyndon Code, MD   Recommendations at discharge:    Follow up with PCP in 2 weeks Follow up with [facility] in 1 month  Discharge Diagnoses: Principal Problem:   Acute CVA (cerebrovascular accident) [facility])  Resolved Problems:   * No resolved hospital problems. [facility] Course:  Mr. Gerald [name] is a 57 year old man with medical history significant for hypertension, hyperlipidemia, anxiety, OSA, type II DM, peripheral neuropathy, carotid artery stenosis, who presented to the hospital with left-sided weakness and numbness.  Symptoms started in the left leg and moved upwards affecting the entire left side of his body.  CT head did not show any acute abnormality.  He was evaluated by tele neurologist in the ED.  He was given tPA for acute stroke and admitted to the ICU for further management.  Follow-up MRI reviewed acute ischemic right thalamic lacunar stroke.  Dr. Thad Ranger, neurologist, recommended dual antiplatelet therapy with aspirin and Plavix for 21 days followed by low-dose aspirin monotherapy.  He was not treated with statins because he is allergic to statins.  His condition has improved and he is deemed stable for discharge to home today.  He has been able to ambulate in the hallway without any problems.  He said all his symptoms have resolved.  He will follow-up with neurologist in the outpatient setting for consideration of PCSK9 inhibitor.  He will be discharged on ezetimibe for hyperlipidemia.  Discharge plan was discussed with Dr. Thad Ranger, neurologist.         Consultants: Neurologist Procedures performed: None Disposition: Home Diet recommendation:  Discharge Diet Orders (From admission, onward)     Start     Ordered   11/12/22 0000  Diet - low sodium heart healthy         11/12/22 1014           Cardiac diet DISCHARGE MEDICATION: Allergies as of 11/12/2022       Reactions   Statins Hives, Rash        Medication List     STOP taking these medications    hydrOXYzine 25 MG capsule Commonly known as: VISTARIL   Testosterone 20.25 MG/ACT (1.62%) Gel       TAKE these medications    aspirin EC 81 MG tablet Take 1 tablet (81 mg total) by mouth daily. Swallow whole.   citalopram 40 MG tablet Commonly known as: CELEXA TAKE 1 TABLET BY MOUTH EVERY DAY   clopidogrel 75 MG tablet Commonly known as: Plavix Take 1 tablet (75 mg total) by mouth daily for 21 days.   furosemide 20 MG tablet Commonly known as: LASIX TAKE 1 TABLET BY MOUTH EVERY DAY AS NEEDED FOR LEG SWELLING   gabapentin 300 MG capsule Commonly known as: NEURONTIN Take 1 capsule (300 mg total) by mouth 2 (two) times daily.   lisinopril-hydrochlorothiazide 20-12.5 MG tablet Commonly known as: ZESTORETIC Take 2 tablets by mouth daily.   MULTIVITAMIN ADULTS PO Take 1 tablet by mouth daily.   nitroGLYCERIN 0.4 MG SL tablet Commonly known as: NITROSTAT Place 1 tablet (0.4 mg total) under the tongue every 5 (five) minutes x 3 doses as needed for chest pain.   Semaglutide(0.25 or 0.5MG /DOS) 2 MG/3ML Sopn Inject 0.5 mg into the skin once a week.  Follow-up Information     [facility]. Schedule an appointment as soon as possible for a visit in 1 month(s).   Contact information: [address] [place] [address] [phone]               Discharge Exam: Gerald [name] Weights   11/10/22 1842 11/10/22 2111  Weight: (!) 148.1 kg (!) 146.8 kg   GEN: NAD SKIN: Warm and dry EYES: No pallor or icterus ENT: MMM CV: RRR PULM: CTA B ABD: soft, obese, NT, +BS CNS: AAO x 3, non focal EXT: No edema or tenderness   Condition at discharge: good  The results of significant diagnostics from this hospitalization  (including imaging, microbiology, ancillary and laboratory) are listed below for reference.   Imaging Studies: MR BRAIN WO CONTRAST  Result Date: 11/11/2022 CLINICAL DATA:  Follow-up examination for stroke. EXAM: MRI HEAD WITHOUT CONTRAST TECHNIQUE: Multiplanar, multiecho pulse sequences of the brain and surrounding structures were obtained without intravenous contrast. COMPARISON:  Prior CTs from 11/10/2022. FINDINGS: Brain: Cerebral volume within normal limits. Patchy T2/FLAIR hyperintensity involving the periventricular white matter, most characteristic of chronic microvascular ischemic disease, mild in nature. 1.1 cm acute ischemic nonhemorrhagic lacunar infarct present within the right thalamus. No associated mass effect. No other evidence for acute or subacute ischemia. No acute or chronic intracranial blood products. No mass lesion, midline shift or mass effect. No hydrocephalus or extra-axial fluid collection. Pituitary gland and suprasellar region within normal limits. Vascular: Major intracranial vascular flow voids are maintained. Skull and upper cervical spine: Craniocervical junction normal. Bone marrow signal intensity within normal limits. No scalp soft tissue abnormality. Sinuses/Orbits: Globes and orbital soft tissues within normal limits. Paranasal sinuses are clear. No significant mastoid effusion. Other: None. IMPRESSION: 1. 1.1 cm acute ischemic nonhemorrhagic right thalamic lacunar infarct. 2. Underlying mild chronic microvascular ischemic disease. Electronically Signed   By: Rise Mu M.D.   On: 11/11/2022 19:09   ECHOCARDIOGRAM COMPLETE  Result Date: 11/11/2022    ECHOCARDIOGRAM REPORT   Patient Name:   Gerald [name] Date of Exam: 11/11/2022 Medical Rec #:  [id number]         Height:       74.0 in Accession #:    [id number]        Weight:       323.6 lb Date of Birth:  May 02, 1965         BSA:          2.667 m Patient Age:    57 years          BP:           144/72 mmHg  Patient Gender: M                 HR:           75 bpm. Exam Location:  [facility] Procedure: 2D Echo, Cardiac Doppler, Color Doppler and Intracardiac            Opacification Agent Indications:     Stroke  History:         Patient has prior history of Echocardiogram examinations, most                  recent 04/09/2022. Cardiomyopathy, Stroke, Signs/Symptoms:Chest                  Pain; Risk Factors:Hypertension, Diabetes, Dyslipidemia and  Physician Discharge Summary   Patient: Gerald [name] MRN: [id number] DOB: 1966-02-02  Admit date:     11/10/2022  Discharge date: 11/12/22  Discharge Physician: Lurene Shadow   PCP: Lyndon Code, MD   Recommendations at discharge:    Follow up with PCP in 2 weeks Follow up with [facility] in 1 month  Discharge Diagnoses: Principal Problem:   Acute CVA (cerebrovascular accident) [facility])  Resolved Problems:   * No resolved hospital problems. [facility] Course:  Mr. Gerald [name] is a 57 year old man with medical history significant for hypertension, hyperlipidemia, anxiety, OSA, type II DM, peripheral neuropathy, carotid artery stenosis, who presented to the hospital with left-sided weakness and numbness.  Symptoms started in the left leg and moved upwards affecting the entire left side of his body.  CT head did not show any acute abnormality.  He was evaluated by tele neurologist in the ED.  He was given tPA for acute stroke and admitted to the ICU for further management.  Follow-up MRI reviewed acute ischemic right thalamic lacunar stroke.  Dr. Thad Ranger, neurologist, recommended dual antiplatelet therapy with aspirin and Plavix for 21 days followed by low-dose aspirin monotherapy.  He was not treated with statins because he is allergic to statins.  His condition has improved and he is deemed stable for discharge to home today.  He has been able to ambulate in the hallway without any problems.  He said all his symptoms have resolved.  He will follow-up with neurologist in the outpatient setting for consideration of PCSK9 inhibitor.  He will be discharged on ezetimibe for hyperlipidemia.  Discharge plan was discussed with Dr. Thad Ranger, neurologist.         Consultants: Neurologist Procedures performed: None Disposition: Home Diet recommendation:  Discharge Diet Orders (From admission, onward)     Start     Ordered   11/12/22 0000  Diet - low sodium heart healthy         11/12/22 1014           Cardiac diet DISCHARGE MEDICATION: Allergies as of 11/12/2022       Reactions   Statins Hives, Rash        Medication List     STOP taking these medications    hydrOXYzine 25 MG capsule Commonly known as: VISTARIL   Testosterone 20.25 MG/ACT (1.62%) Gel       TAKE these medications    aspirin EC 81 MG tablet Take 1 tablet (81 mg total) by mouth daily. Swallow whole.   citalopram 40 MG tablet Commonly known as: CELEXA TAKE 1 TABLET BY MOUTH EVERY DAY   clopidogrel 75 MG tablet Commonly known as: Plavix Take 1 tablet (75 mg total) by mouth daily for 21 days.   furosemide 20 MG tablet Commonly known as: LASIX TAKE 1 TABLET BY MOUTH EVERY DAY AS NEEDED FOR LEG SWELLING   gabapentin 300 MG capsule Commonly known as: NEURONTIN Take 1 capsule (300 mg total) by mouth 2 (two) times daily.   lisinopril-hydrochlorothiazide 20-12.5 MG tablet Commonly known as: ZESTORETIC Take 2 tablets by mouth daily.   MULTIVITAMIN ADULTS PO Take 1 tablet by mouth daily.   nitroGLYCERIN 0.4 MG SL tablet Commonly known as: NITROSTAT Place 1 tablet (0.4 mg total) under the tongue every 5 (five) minutes x 3 doses as needed for chest pain.   Semaglutide(0.25 or 0.5MG /DOS) 2 MG/3ML Sopn Inject 0.5 mg into the skin once a week.

## 2022-11-12 NOTE — Plan of Care (Signed)
  Problem: Ischemic Stroke/TIA Tissue Perfusion: Goal: Complications of ischemic stroke/TIA will be minimized Outcome: Progressing   Problem: Coping: Goal: Will verbalize positive feelings about self Outcome: Progressing   Problem: Self-Care: Goal: Ability to participate in self-care as condition permits will improve Outcome: Progressing Goal: Verbalization of feelings and concerns over difficulty with self-care will improve Outcome: Progressing   Problem: Nutrition: Goal: Dietary intake will improve Outcome: Progressing

## 2022-11-13 ENCOUNTER — Other Ambulatory Visit: Payer: Self-pay

## 2022-11-27 NOTE — Plan of Care (Signed)
[facility] Tonsillectomy/Adenoidectomy, Postoperative PEDS care plan entered in error.

## 2023-01-11 ENCOUNTER — Other Ambulatory Visit: Payer: Self-pay | Admitting: Physician Assistant

## 2023-01-11 DIAGNOSIS — F411 Generalized anxiety disorder: Secondary | ICD-10-CM

## 2023-01-12 ENCOUNTER — Telehealth: Payer: Self-pay | Admitting: Physician Assistant

## 2023-01-12 NOTE — Telephone Encounter (Signed)
Attempted to contact patient to schedule appointment for med refills.Nonworking telephone #-Gerald [name]

## 2023-01-26 ENCOUNTER — Other Ambulatory Visit: Payer: Self-pay | Admitting: Internal Medicine

## 2023-01-26 DIAGNOSIS — F411 Generalized anxiety disorder: Secondary | ICD-10-CM

## 2023-04-10 ENCOUNTER — Ambulatory Visit: Payer: Self-pay | Admitting: Physician Assistant

## 2023-04-13 ENCOUNTER — Ambulatory Visit: Payer: Self-pay | Admitting: Physician Assistant

## 2023-04-13 ENCOUNTER — Encounter: Payer: Self-pay | Admitting: Physician Assistant

## 2023-04-13 ENCOUNTER — Telehealth: Payer: Self-pay | Admitting: Physician Assistant

## 2023-04-13 VITALS — BP 130/80 | HR 88 | Temp 98.4°F | Resp 16 | Ht 74.0 in | Wt 312.2 lb

## 2023-04-13 DIAGNOSIS — M79605 Pain in left leg: Secondary | ICD-10-CM | POA: Diagnosis not present

## 2023-04-13 DIAGNOSIS — G629 Polyneuropathy, unspecified: Secondary | ICD-10-CM

## 2023-04-13 DIAGNOSIS — I6930 Unspecified sequelae of cerebral infarction: Secondary | ICD-10-CM

## 2023-04-13 DIAGNOSIS — E1165 Type 2 diabetes mellitus with hyperglycemia: Secondary | ICD-10-CM | POA: Diagnosis not present

## 2023-04-13 DIAGNOSIS — I10 Essential (primary) hypertension: Secondary | ICD-10-CM

## 2023-04-13 DIAGNOSIS — F411 Generalized anxiety disorder: Secondary | ICD-10-CM

## 2023-04-13 LAB — POCT GLYCOSYLATED HEMOGLOBIN (HGB A1C): Hemoglobin A1C: 9.1 % — AB (ref 4.0–5.6)

## 2023-04-13 MED ORDER — CYCLOBENZAPRINE HCL 10 MG PO TABS: 10.0000 mg | ORAL_TABLET | Freq: Every day | ORAL | 1 refills | Status: DC

## 2023-04-13 MED ORDER — SEMAGLUTIDE(0.25 OR 0.5MG/DOS) 2 MG/3ML ~~LOC~~ SOPN: 0.2500 mg | PEN_INJECTOR | SUBCUTANEOUS | 2 refills | Status: DC

## 2023-04-13 MED ORDER — CITALOPRAM HYDROBROMIDE 40 MG PO TABS: ORAL_TABLET | ORAL | 1 refills | Status: DC

## 2023-04-13 NOTE — Progress Notes (Signed)
 [facility] [address] [place], [place] [address]  Internal MEDICINE  Office Visit Note  Patient Name: Gerald [name]  [id number]  [id number]  Date of Service: 04/13/2023  Chief Complaint  Patient presents with   Follow-up   Depression   Gastroesophageal Reflux   Hypertension   Hyperlipidemia   Diabetes   Residual issues from having a stroke in October 2024    HPI Pt is here for routine follow up -Unfortunately he has been without insurance since losing his previous job in Oct, 3 days before having a stroke -he has not been seen since then due to this -Still having residual effects from stroke, on left side has numbness in both arm and leg -When getting up will have a lot of stiffness and pain  -taking high does ibuprofen and tylenol. States he took 2400mg  ibuprofen one night and knows this is too much and needs to decrease use.  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 Is in freezer/frige which doesn't help. Goes in at 3pm and gets off around 1am but doesn't get to sleep until 4am.  Is in pain when he gets home. He is taking gabapentin. Has been taking 300mg  TID. Will add muscle relaxer to help and is aware of S/E. States burning in feet still present as well.  -was not able to see neurology or do PT after the stroke due to losing insurance, does need to start now and will send referrals -Cannot completely make a fist with left hand either and may need OT in future as well. Leg is primary concern due to pain and stiffness along with numbness and will start with PT -BP stable, taking lisinopril-hydrochlorothiazide. Also taking celexa as before--never heard from psych but wants to hold off on new referral at this time -not taking zetia or lasix currently and has been without diabetic meds for awhile. Will try to restart ozempic  Current Medication: Outpatient Encounter Medications as of 04/13/2023  Medication Sig   aspirin EC 81 MG tablet  Take 1 tablet (81 mg total) by mouth daily. Swallow whole.   cyclobenzaprine (FLEXERIL) 10 MG tablet Take 1 tablet (10 mg total) by mouth at bedtime. Take one tab po qhs for back spasm prn only   ezetimibe (ZETIA) 10 MG tablet Take 1 tablet (10 mg total) by mouth daily.   furosemide (LASIX) 20 MG tablet TAKE 1 TABLET BY MOUTH EVERY DAY AS NEEDED FOR LEG SWELLING   gabapentin (NEURONTIN) 300 MG capsule Take 1 capsule (300 mg total) by mouth 2 (two) times daily.   lisinopril-hydrochlorothiazide (ZESTORETIC) 20-12.5 MG tablet Take 2 tablets by mouth daily.   Multiple Vitamins-Minerals (MULTIVITAMIN ADULTS PO) Take 1 tablet by mouth daily.   nitroGLYCERIN (NITROSTAT) 0.4 MG SL tablet Place 1 tablet (0.4 mg total) under the tongue every 5 (five) minutes x 3 doses as needed for chest pain.   Semaglutide,0.25 or 0.5MG /DOS, 2 MG/3ML SOPN Inject 0.25 mg into the skin once a week.   [DISCONTINUED] citalopram (CELEXA) 40 MG tablet TAKE 1 TABLET BY MOUTH EVERY DAY   [DISCONTINUED] Semaglutide,0.25 or 0.5MG /DOS, 2 MG/3ML SOPN Inject 0.5 mg into the skin once a week.   citalopram (CELEXA) 40 MG tablet TAKE 1 TABLET BY MOUTH EVERY DAY   No facility-administered encounter medications on file as of 04/13/2023.    Surgical History: Past Surgical History:  Procedure Laterality Date   BONE EXCISION Right 11/21/2020   Procedure: PART EXCISION BONE- PHALANX;  Surgeon: Gwyneth Revels, DPM;  Location: [facility];  Service: Podiatry;  Laterality: Right;   CARDIAC CATHETERIZATION N/A 09/2011   [facility]; EF 50% with 30% mid LAD stenosis and no obstructive disease.   CARDIAC CATHETERIZATION  09/2010   [facility]; Mid LAD 40% stenosis; Mid Circumflex:Normal; Mid RCA; Normal   CARDIAC CATHETERIZATION     COLONOSCOPY     COLONOSCOPY WITH PROPOFOL N/A 04/10/2021   Procedure: COLONOSCOPY WITH PROPOFOL;  Surgeon: Wyline Mood, MD;  Location: [facility] ENDOSCOPY;  Service: Gastroenterology;  Laterality: N/A;    ESOPHAGOGASTRODUODENOSCOPY N/A 04/10/2021   Procedure: ESOPHAGOGASTRODUODENOSCOPY (EGD);  Surgeon: Wyline Mood, MD;  Location: [facility] ENDOSCOPY;  Service: Gastroenterology;  Laterality: N/A;   ESOPHAGOGASTRODUODENOSCOPY (EGD) WITH PROPOFOL N/A 06/10/2017   Procedure: ESOPHAGOGASTRODUODENOSCOPY (EGD) WITH PROPOFOL;  Surgeon: Wyline Mood, MD;  Location: [facility] END[facility]COPY;  Service: Gastroenterology;  Laterality: N/A;   ESOPHAGOGASTRODUODENOSCOPY (EGD) WITH PROPOFOL N/A 01/01/2022   Procedure: ESOPHAGOGASTRODUODENOSCOPY (EGD) WITH PROPOFOL;  Surgeon: Wyline Mood, MD;  Location: [facility] ENDOSCOPY;  Service: Gastroenterology;  Laterality: N/A;   ESOPHAGOGASTRODUODENOSCOPY (EGD) WITH PROPOFOL N/A 01/06/2022   Procedure: ESOPHAGOGASTRODUODENOSCOPY (EGD) WITH PROPOFOL;  Surgeon: Wyline Mood, MD;  Location: [facility] ENDOSCOPY;  Service: Gastroenterology;  Laterality: N/A;   HAMMER TOE SURGERY Right 11/21/2020   Procedure: HAMMERTOE CORRECTION;  Surgeon: Gwyneth Revels, DPM;  Location: [facility];  Service: Podiatry;  Laterality: Right;   INSERTION OF MESH  04/16/2021   Procedure: INSERTION OF MESH;  Surgeon: Henrene Dodge, MD;  Location: [facility] ORS;  Service: General;;   KNEE ARTHROSCOPY WITH MEDIAL MENISECTOMY Right 09/16/2012   Procedure: RIGHT KNEE ARTHROSCOPY WITH MEDIAL AND LATERAL MENISECTOMY, CHONDROPLASTY;  Surgeon: Loreta Ave, MD;  Location: [facility];  Service: Orthopedics;  Laterality: Right;  RIGHT KNEE SCOPE MEDIAL MENISCECTOMY   LEFT HEART CATH AND CORONARY ANGIOGRAPHY N/A 06/11/2016   Procedure: Left Heart Cath and Coronary Angiography;  Surgeon: Antonieta Iba, MD;  Location: [facility] INVASIVE CV LAB;  Service: Cardiovascular;  Laterality: N/A;   UMBILICAL HERNIA REPAIR N/A 04/16/2021   Procedure: HERNIA REPAIR UMBILICAL ADULT, open;  Surgeon: Henrene Dodge, MD;  Location: [facility] ORS;  Service: General;  Laterality: N/A;    Medical History: Past Medical History:  Diagnosis Date    Abnormal LFTs    Anxiety    Atypical chest pain    Borderline diabetes    CAD (coronary artery disease)    a.) PCI and stent placement (unknown type) to mLAD and mLCx; date unknown. b.) LHC 09/10/2010: 40% mLAD; no intervention. c.) LHC 09/12/2011: EF 60%; 30% ISR mLAD, 40% dLAD, 30% pLCx, 30% ISR mLCx, 40% mRCA; no intervention. d.) LHC 05/23/2014: EF >55%; 30% p-mLAD; no intervention. e.) LHC 06/11/2016: EF 55-65%; LVEDP norm; 30-40% p-mLAD; no intervention.   Carcinoma (HCC)    Chronic pain syndrome    Depression    Fundic gland polyps of stomach, benign    Gastritis    GERD (gastroesophageal reflux disease)    Hepatic steatosis    Hyperlipidemia    Hypertension    Migraines    Myocardial infarction [facility]) 2008   was told by PCP   Nonischemic cardiomyopathy (HCC)    a.) TTE 09/12/2011: EF 35-45%. b.) LHC 09/12/2011: EF 60%. c.) TTE 06/04/2012: EF 55-60%. d.) LHC 05/23/2014: EF >55%. e.) TTE 06/11/2016: EF 55-60%; G1DD. f.) LHC 06/11/2016: EF 55-65%. g.) TTE 01/08/2018: EF 60-65%.   Obesity    Occlusion and stenosis of bilateral carotid arteries    OSA on CPAP  Osteoarthritis of both knees    Shortness of breath    Squamous cell carcinoma of skin 02/27/2020   left distal lat deltoid - [facility]    Family History: Family History  Problem Relation Age of Onset   Heart disease Father 54       MI   Heart attack Father    Stomach cancer Father    Hypertension Mother    Breast cancer Mother     Social History   Socioeconomic History   Marital status: Married    Spouse name: Not on file   Number of children: 3   Years of education: Not on file   Highest education level: Not on file  Occupational History   Occupation: Camera operator  Tobacco Use   Smoking status: Never    Passive exposure: Past   Smokeless tobacco: Former    Types: Chew    Quit date: 1987  Vaping Use   Vaping status: Never Used  Substance and Sexual Activity   Alcohol use: Not Currently   Drug use:  No   Sexual activity: Yes  Other Topics Concern   Not on file  Social History Narrative   Married.  29 yo son.   Wife on disability for bipolar disorder.     Social Drivers of Corporate investment banker Strain: Not on file  Food Insecurity: No Food Insecurity (11/10/2022)   Hunger Vital Sign    Worried About Running Out of Food in the Last Year: Never true    Ran Out of Food in the Last Year: Never true  Transportation Needs: No Transportation Needs (11/10/2022)   PRAPARE - Administrator, Civil Service (Medical): No    Lack of Transportation (Non-Medical): No  Physical Activity: Not on file  Stress: Not on file  Social Connections: Unknown (11/18/2021)   [facility] from [facility]   Social Network    Social Network: Not on file  Intimate Partner Violence: Not At Risk (11/10/2022)   Humiliation, Afraid, Rape, and Kick questionnaire    Fear of Current or Ex-Partner: No    Emotionally Abused: No    Physically Abused: No    Sexually Abused: No      Review of Systems  Constitutional:  Positive for fatigue. Negative for chills and unexpected weight change.  HENT:  Negative for congestion, postnasal drip, rhinorrhea, sneezing and sore throat.   Eyes:  Negative for redness.  Respiratory:  Negative for cough, chest tightness and shortness of breath.   Cardiovascular:  Negative for chest pain and palpitations.  Gastrointestinal:  Negative for abdominal pain, anal bleeding, constipation, diarrhea, nausea and vomiting.  Genitourinary:  Negative for dysuria and frequency.  Musculoskeletal:  Positive for arthralgias, back pain, gait problem and myalgias. Negative for joint swelling and neck pain.  Skin:  Negative for rash.  Neurological:  Positive for weakness and numbness. Negative for tremors.  Hematological:  Negative for adenopathy. Does not bruise/bleed easily.  Psychiatric/Behavioral:  Positive for behavioral problems (Depression) and sleep disturbance. Negative for  self-injury and suicidal ideas. The patient is nervous/anxious.     Vital Signs: BP 130/80   Pulse 88   Temp 98.4 F (36.9 C)   Resp 16   Ht 6\' 2"  (1.88 m)   Wt (!) 312 lb 3.2 oz (141.6 kg)   SpO2 97%   BMI 40.08 kg/m    Physical Exam Vitals and nursing note reviewed.  Constitutional:      General: He is  not in acute distress.    Appearance: Normal appearance. He is well-developed. He is obese. He is not diaphoretic.  HENT:     Head: Normocephalic and atraumatic.     Mouth/Throat:     Pharynx: No oropharyngeal exudate.  Eyes:     Pupils: Pupils are equal, round, and reactive to light.  Neck:     Thyroid: No thyromegaly.     Vascular: No JVD.     Trachea: No tracheal deviation.  Cardiovascular:     Rate and Rhythm: Normal rate and regular rhythm.     Heart sounds: Normal heart sounds. No murmur heard.    No friction rub. No gallop.  Pulmonary:     Effort: Pulmonary effort is normal. No respiratory distress.     Breath sounds: No wheezing or rales.  Chest:     Chest wall: No tenderness.  Abdominal:     General: Bowel sounds are normal.     Palpations: Abdomen is soft.  Musculoskeletal:     Cervical back: Normal range of motion and neck supple.  Lymphadenopathy:     Cervical: No cervical adenopathy.  Skin:    General: Skin is warm and dry.  Neurological:     Mental Status: He is alert and oriented to person, place, and time.     Cranial Nerves: No cranial nerve deficit.     Sensory: Sensory deficit present.     Gait: Gait abnormal.  Psychiatric:        Thought Content: Thought content normal.        Judgment: Judgment normal.        Assessment/Plan: 1. Type 2 diabetes mellitus with hyperglycemia, unspecified whether long term insulin use (HCC) (Primary) - POCT HgB A1C is 9.1 which is up from 8.4 last visit. Has been without medication and will try to restart ozempic and titrate accordingly - Urine Microalbumin w/creat. ratio - Semaglutide,0.25 or  0.5MG /DOS, 2 MG/3ML SOPN; Inject 0.25 mg into the skin once a week.  Dispense: 3 mL; Refill: 2  2. History of stroke with current residual effects Will refer to neurology as well at PT - Ambulatory referral to Neurology - Ambulatory referral to Physical Therapy  3. Neuropathy Continue gabapentin and will refer to neurology - Ambulatory referral to Neurology - Ambulatory referral to Physical Therapy  4. Pain of left lower extremity Continue gabapentin and will refer to neurology and PT. May also use flexeril as needed for muscle stiffness and pain. Aware of possible side effects and increasing grogginess - Ambulatory referral to Neurology - Ambulatory referral to Physical Therapy  5. Generalized anxiety disorder - citalopram (CELEXA) 40 MG tablet; TAKE 1 TABLET BY MOUTH EVERY DAY  Dispense: 90 tablet; Refill: 1  6. Essential hypertension Stable, continue current medication   General Counseling: aric jost understanding of the findings of todays visit and agrees with plan of treatment. I have discussed any further diagnostic evaluation that may be needed or ordered today. We also reviewed his medications today. he has been encouraged to call the office with any questions or concerns that should arise related to todays visit.    Orders Placed This Encounter  Procedures   Urine Microalbumin w/creat. ratio   Ambulatory referral to Neurology   Ambulatory referral to Physical Therapy   POCT HgB A1C    Meds ordered this encounter  Medications   citalopram (CELEXA) 40 MG tablet    Sig: TAKE 1 TABLET BY MOUTH EVERY DAY    Dispense:  90 tablet    Refill:  1   Semaglutide,0.25 or 0.5MG /DOS, 2 MG/3ML SOPN    Sig: Inject 0.25 mg into the skin once a week.    Dispense:  3 mL    Refill:  2   cyclobenzaprine (FLEXERIL) 10 MG tablet    Sig: Take 1 tablet (10 mg total) by mouth at bedtime. Take one tab po qhs for back spasm prn only    Dispense:  30 tablet    Refill:  1     This patient was seen by Lynn Ito, PA-C in collaboration with Dr. Beverely Risen as a part of collaborative care agreement.   Total time spent:35 Minutes Time spent includes review of chart, medications, test results, and follow up plan with the patient.      Dr Lyndon Code Internal medicine

## 2023-04-13 NOTE — Telephone Encounter (Signed)
 Physical Therapy referral faxed to [facility] ; [phone]. Notified patient.  Neurology referral sent via [facility] to [facility].  Notified patent-Toni

## 2023-04-16 ENCOUNTER — Inpatient Hospital Stay
Admission: EM | Admit: 2023-04-16 | Discharge: 2023-04-18 | DRG: 057 | Disposition: A | Discharge status: Home / Self Care | Attending: Pulmonary Disease | Admitting: Pulmonary Disease

## 2023-04-16 ENCOUNTER — Inpatient Hospital Stay

## 2023-04-16 ENCOUNTER — Other Ambulatory Visit: Payer: Self-pay

## 2023-04-16 ENCOUNTER — Encounter: Payer: Self-pay | Admitting: Emergency Medicine

## 2023-04-16 ENCOUNTER — Emergency Department

## 2023-04-16 DIAGNOSIS — K760 Fatty (change of) liver, not elsewhere classified: Secondary | ICD-10-CM | POA: Diagnosis present

## 2023-04-16 DIAGNOSIS — I69398 Other sequelae of cerebral infarction: Secondary | ICD-10-CM | POA: Diagnosis not present

## 2023-04-16 DIAGNOSIS — G4733 Obstructive sleep apnea (adult) (pediatric): Secondary | ICD-10-CM | POA: Diagnosis present

## 2023-04-16 DIAGNOSIS — R200 Anesthesia of skin: Secondary | ICD-10-CM | POA: Diagnosis present

## 2023-04-16 DIAGNOSIS — Z7902 Long term (current) use of antithrombotics/antiplatelets: Secondary | ICD-10-CM

## 2023-04-16 DIAGNOSIS — Z955 Presence of coronary angioplasty implant and graft: Secondary | ICD-10-CM

## 2023-04-16 DIAGNOSIS — Z7985 Long-term (current) use of injectable non-insulin antidiabetic drugs: Secondary | ICD-10-CM

## 2023-04-16 DIAGNOSIS — Z7982 Long term (current) use of aspirin: Secondary | ICD-10-CM

## 2023-04-16 DIAGNOSIS — E876 Hypokalemia: Secondary | ICD-10-CM | POA: Diagnosis present

## 2023-04-16 DIAGNOSIS — E871 Hypo-osmolality and hyponatremia: Secondary | ICD-10-CM | POA: Diagnosis present

## 2023-04-16 DIAGNOSIS — I69354 Hemiplegia and hemiparesis following cerebral infarction affecting left non-dominant side: Principal | ICD-10-CM

## 2023-04-16 DIAGNOSIS — Z6841 Body Mass Index (BMI) 40.0 and over, adult: Secondary | ICD-10-CM | POA: Diagnosis not present

## 2023-04-16 DIAGNOSIS — G8194 Hemiplegia, unspecified affecting left nondominant side: Secondary | ICD-10-CM | POA: Diagnosis present

## 2023-04-16 DIAGNOSIS — I6381 Other cerebral infarction due to occlusion or stenosis of small artery: Principal | ICD-10-CM | POA: Diagnosis present

## 2023-04-16 DIAGNOSIS — G894 Chronic pain syndrome: Secondary | ICD-10-CM | POA: Diagnosis present

## 2023-04-16 DIAGNOSIS — E785 Hyperlipidemia, unspecified: Secondary | ICD-10-CM | POA: Diagnosis present

## 2023-04-16 DIAGNOSIS — I639 Cerebral infarction, unspecified: Secondary | ICD-10-CM | POA: Diagnosis present

## 2023-04-16 DIAGNOSIS — E119 Type 2 diabetes mellitus without complications: Secondary | ICD-10-CM | POA: Diagnosis not present

## 2023-04-16 DIAGNOSIS — I10 Essential (primary) hypertension: Secondary | ICD-10-CM | POA: Diagnosis present

## 2023-04-16 DIAGNOSIS — E1142 Type 2 diabetes mellitus with diabetic polyneuropathy: Secondary | ICD-10-CM | POA: Diagnosis present

## 2023-04-16 DIAGNOSIS — M170 Bilateral primary osteoarthritis of knee: Secondary | ICD-10-CM | POA: Diagnosis present

## 2023-04-16 DIAGNOSIS — G4440 Drug-induced headache, not elsewhere classified, not intractable: Secondary | ICD-10-CM | POA: Diagnosis present

## 2023-04-16 DIAGNOSIS — I2510 Atherosclerotic heart disease of native coronary artery without angina pectoris: Secondary | ICD-10-CM | POA: Diagnosis present

## 2023-04-16 DIAGNOSIS — I428 Other cardiomyopathies: Secondary | ICD-10-CM | POA: Diagnosis present

## 2023-04-16 DIAGNOSIS — G890 Central pain syndrome: Secondary | ICD-10-CM | POA: Diagnosis present

## 2023-04-16 DIAGNOSIS — Z79899 Other long term (current) drug therapy: Secondary | ICD-10-CM

## 2023-04-16 DIAGNOSIS — R29705 NIHSS score 5: Secondary | ICD-10-CM | POA: Diagnosis present

## 2023-04-16 DIAGNOSIS — Z8249 Family history of ischemic heart disease and other diseases of the circulatory system: Secondary | ICD-10-CM

## 2023-04-16 DIAGNOSIS — Z888 Allergy status to other drugs, medicaments and biological substances status: Secondary | ICD-10-CM

## 2023-04-16 DIAGNOSIS — R531 Weakness: Secondary | ICD-10-CM | POA: Diagnosis not present

## 2023-04-16 DIAGNOSIS — Z8673 Personal history of transient ischemic attack (TIA), and cerebral infarction without residual deficits: Secondary | ICD-10-CM | POA: Diagnosis not present

## 2023-04-16 DIAGNOSIS — Z85828 Personal history of other malignant neoplasm of skin: Secondary | ICD-10-CM | POA: Diagnosis not present

## 2023-04-16 DIAGNOSIS — I252 Old myocardial infarction: Secondary | ICD-10-CM

## 2023-04-16 DIAGNOSIS — Z91148 Patient's other noncompliance with medication regimen for other reason: Secondary | ICD-10-CM | POA: Diagnosis not present

## 2023-04-16 LAB — DIFFERENTIAL
Abs Immature Granulocytes: 0.04 10*3/uL (ref 0.00–0.07)
Basophils Absolute: 0 10*3/uL (ref 0.0–0.1)
Basophils Relative: 0 %
Eosinophils Absolute: 0.1 10*3/uL (ref 0.0–0.5)
Eosinophils Relative: 1 %
Immature Granulocytes: 1 %
Lymphocytes Relative: 41 %
Lymphs Abs: 3.5 10*3/uL (ref 0.7–4.0)
Monocytes Absolute: 0.6 10*3/uL (ref 0.1–1.0)
Monocytes Relative: 7 %
Neutro Abs: 4.2 10*3/uL (ref 1.7–7.7)
Neutrophils Relative %: 50 %

## 2023-04-16 LAB — URINALYSIS, ROUTINE W REFLEX MICROSCOPIC
Bacteria, UA: NONE SEEN
Bilirubin Urine: NEGATIVE
Glucose, UA: NEGATIVE mg/dL
Ketones, ur: NEGATIVE mg/dL
Leukocytes,Ua: NEGATIVE
Nitrite: NEGATIVE
Protein, ur: NEGATIVE mg/dL
Specific Gravity, Urine: 1.035 — ABNORMAL HIGH (ref 1.005–1.030)
pH: 5 (ref 5.0–8.0)

## 2023-04-16 LAB — COMPREHENSIVE METABOLIC PANEL
ALT: 42 U/L (ref 0–44)
AST: 35 U/L (ref 15–41)
Albumin: 4.2 g/dL (ref 3.5–5.0)
Alkaline Phosphatase: 69 U/L (ref 38–126)
Anion gap: 13 (ref 5–15)
BUN: 44 mg/dL — ABNORMAL HIGH (ref 6–20)
CO2: 25 mmol/L (ref 22–32)
Calcium: 9.4 mg/dL (ref 8.9–10.3)
Chloride: 96 mmol/L — ABNORMAL LOW (ref 98–111)
Creatinine, Ser: 1 mg/dL (ref 0.61–1.24)
GFR, Estimated: >60 mL/min (ref >60)
Glucose, Bld: 168 mg/dL — ABNORMAL HIGH (ref 70–99)
Potassium: 3.2 mmol/L — ABNORMAL LOW (ref 3.5–5.1)
Sodium: 134 mmol/L — ABNORMAL LOW (ref 135–145)
Total Bilirubin: 1.3 mg/dL — ABNORMAL HIGH (ref 0.0–1.2)
Total Protein: 7.7 g/dL (ref 6.5–8.1)

## 2023-04-16 LAB — CBC
HCT: 41.9 % (ref 39.0–52.0)
Hemoglobin: 14.9 g/dL (ref 13.0–17.0)
MCH: 30.5 pg (ref 26.0–34.0)
MCHC: 35.6 g/dL (ref 30.0–36.0)
MCV: 85.7 fL (ref 80.0–100.0)
Platelets: 255 10*3/uL (ref 150–400)
RBC: 4.89 MIL/uL (ref 4.22–5.81)
RDW: 13.6 % (ref 11.5–15.5)
WBC: 8.4 10*3/uL (ref 4.0–10.5)
nRBC: 0 % (ref 0.0–0.2)

## 2023-04-16 LAB — PROTIME-INR
INR: 1.1 (ref 0.8–1.2)
Prothrombin Time: 14.7 s (ref 11.4–15.2)

## 2023-04-16 LAB — URINE DRUG SCREEN, QUALITATIVE (ARMC ONLY)
Amphetamines, Ur Screen: NOT DETECTED
Barbiturates, Ur Screen: NOT DETECTED
Benzodiazepine, Ur Scrn: NOT DETECTED
Cannabinoid 50 Ng, Ur ~~LOC~~: NOT DETECTED
Cocaine Metabolite,Ur ~~LOC~~: NOT DETECTED
MDMA (Ecstasy)Ur Screen: NOT DETECTED
Methadone Scn, Ur: NOT DETECTED
Opiate, Ur Screen: NOT DETECTED
Phencyclidine (PCP) Ur S: NOT DETECTED
Tricyclic, Ur Screen: NOT DETECTED

## 2023-04-16 LAB — APTT: aPTT: 30 s (ref 24–36)

## 2023-04-16 LAB — CBG MONITORING, ED: Glucose-Capillary: 179 mg/dL — ABNORMAL HIGH (ref 70–99)

## 2023-04-16 LAB — ETHANOL: Alcohol, Ethyl (B): <10 mg/dL (ref <10)

## 2023-04-16 LAB — MRSA NEXT GEN BY PCR, NASAL: MRSA by PCR Next Gen: NOT DETECTED

## 2023-04-16 LAB — GLUCOSE, CAPILLARY: Glucose-Capillary: 140 mg/dL — ABNORMAL HIGH (ref 70–99)

## 2023-04-16 MED ORDER — STROKE: EARLY STAGES OF RECOVERY BOOK: Freq: Once | Status: AC

## 2023-04-16 MED ORDER — SODIUM CHLORIDE 0.9 % IV SOLN: INTRAVENOUS | Status: DC

## 2023-04-16 MED ORDER — ACETAMINOPHEN 160 MG/5ML PO SOLN: 650.0000 mg | ORAL | Status: DC | PRN

## 2023-04-16 MED ORDER — POTASSIUM CHLORIDE 10 MEQ/100ML IV SOLN
10.0000 meq | INTRAVENOUS | Status: AC
  Administered 2023-04-16 (×4): 10 meq via INTRAVENOUS
  Filled 2023-04-16 (×4): qty 100

## 2023-04-16 MED ORDER — IOHEXOL 350 MG/ML SOLN
75.0000 mL | Freq: Once | INTRAVENOUS | Status: AC | PRN
  Administered 2023-04-16: 75 mL via INTRAVENOUS

## 2023-04-16 MED ORDER — TENECTEPLASE FOR STROKE
25.0000 mg | PACK | Freq: Once | INTRAVENOUS | Status: AC
Start: 2023-04-16 — End: 2023-04-16
  Administered 2023-04-16: 25 mg via INTRAVENOUS

## 2023-04-16 MED ORDER — ACETAMINOPHEN 10 MG/ML IV SOLN
1000.0000 mg | Freq: Once | INTRAVENOUS | Status: AC
  Administered 2023-04-16: 1000 mg via INTRAVENOUS
  Filled 2023-04-16: qty 100

## 2023-04-16 MED ORDER — LABETALOL HCL 5 MG/ML IV SOLN: 20.0000 mg | Freq: Once | INTRAVENOUS | Status: DC

## 2023-04-16 MED ORDER — ACETAMINOPHEN 650 MG RE SUPP: 650.0000 mg | RECTAL | Status: DC | PRN

## 2023-04-16 MED ORDER — SENNOSIDES-DOCUSATE SODIUM 8.6-50 MG PO TABS: 1.0000 | ORAL_TABLET | Freq: Every evening | ORAL | Status: DC | PRN

## 2023-04-16 MED ORDER — INSULIN ASPART 100 UNIT/ML IJ SOLN
0.0000 [IU] | Freq: Three times a day (TID) | INTRAMUSCULAR | Status: DC
  Administered 2023-04-17: 5 [IU] via SUBCUTANEOUS
  Administered 2023-04-17: 2 [IU] via SUBCUTANEOUS
  Administered 2023-04-17 – 2023-04-18 (×2): 3 [IU] via SUBCUTANEOUS
  Filled 2023-04-16 (×4): qty 1

## 2023-04-16 MED ORDER — ACETAMINOPHEN 325 MG PO TABS
650.0000 mg | ORAL_TABLET | ORAL | Status: DC | PRN
  Filled 2023-04-16: qty 2

## 2023-04-16 MED ORDER — POTASSIUM CHLORIDE CRYS ER 20 MEQ PO TBCR: 40.0000 meq | EXTENDED_RELEASE_TABLET | Freq: Once | ORAL | Status: DC

## 2023-04-16 MED ORDER — INSULIN ASPART 100 UNIT/ML IJ SOLN: 0.0000 [IU] | Freq: Every day | INTRAMUSCULAR | Status: DC

## 2023-04-16 MED ORDER — PANTOPRAZOLE SODIUM 40 MG IV SOLR
40.0000 mg | Freq: Every day | INTRAVENOUS | Status: DC
  Administered 2023-04-16 – 2023-04-17 (×2): 40 mg via INTRAVENOUS
  Filled 2023-04-16 (×2): qty 10

## 2023-04-16 MED ORDER — CLEVIDIPINE BUTYRATE 0.5 MG/ML IV EMUL: 0.0000 mg/h | INTRAVENOUS | Status: DC

## 2023-04-16 NOTE — ED Provider Notes (Signed)
 [facility] Provider Note    Event Date/Time   First MD Initiated Contact with Patient 04/16/23 1629     (approximate)   History   No chief complaint on file.   HPI  Gerald [name] is a 58 y.o. male with history of hypertension, hyperlipidemia, anxiety, OSA, type 2 diabetes peripheral neuropathy who comes in with concerns for stroke code.  On review of records patient was admitted on 11/10/2022 patient was given tPA and MRI did show acute ischemic right thalamic lacunar stroke.  Patient had some minimal deficits and more is getting better with physical therapy when he had sudden onset of increasing weakness, numbness on the left side about 1 hour prior to arrival.  He denies any falls and hitting his head.  He denies any chest pain.   Physical Exam   Triage Vital Signs: ED Triage Vitals  Encounter Vitals Group     BP      Systolic BP Percentile      Diastolic BP Percentile      Pulse      Resp      Temp      Temp src      SpO2      Weight      Height      Head Circumference      Peak Flow      Pain Score      Pain Loc      Pain Education      Exclude from Growth Chart     Most recent vital signs: Vitals:   04/16/23 1705  BP: (!) 141/61  Pulse: 74  Resp: 17  Temp: 98.5 F (36.9 C)  SpO2: 100%     General: Awake, no distress.  CV:  Good peripheral perfusion.  Resp:  Normal effort.  Abd:  No distention.  Other:  Left-sided weakness   ED Results / Procedures / Treatments   Labs (all labs ordered are listed, but only abnormal results are displayed) Labs Reviewed  CBG MONITORING, ED - Abnormal; Notable for the following components:      Result Value   Glucose-Capillary 179 (*)    All other components within normal limits  ETHANOL  PROTIME-INR  APTT  CBC  DIFFERENTIAL  COMPREHENSIVE METABOLIC PANEL  URINE DRUG SCREEN, QUALITATIVE ([facility] ONLY)  URINALYSIS, ROUTINE W REFLEX MICROSCOPIC     EKG  My interpretation  of EKG:  Sinus rate 74 without any ST elevation or T wave inversions, normal normals  RADIOLOGY I have reviewed the CT head personally interpreted no evidence of intracranial hemorrhage   PROCEDURES:  Critical Care performed: yes   .Critical Care  Performed by: Concha Se, MD Authorized by: Concha Se, MD   Critical care provider statement:    Critical care time (minutes):  30   Critical care was necessary to treat or prevent imminent or life-threatening deterioration of the following conditions:  CNS failure or compromise   Critical care was time spent personally by me on the following activities:  Development of treatment plan with patient or surrogate, discussions with consultants, evaluation of patient's response to treatment, examination of patient, ordering and review of laboratory studies, ordering and review of radiographic studies, ordering and performing treatments and interventions, pulse oximetry, re-evaluation of patient's condition and review of old charts .1-3 Lead EKG Interpretation  Performed by: Concha Se, MD Authorized by: Concha Se, MD     Interpretation: normal  ECG rate:  70   ECG rate assessment: normal     Rhythm: sinus rhythm     Ectopy: none     Conduction: normal      MEDICATIONS ORDERED IN ED: Medications  tenecteplase (TNKASE) injection for Stroke 25 mg (has no administration in time range)  iohexol (OMNIPAQUE) 350 MG/ML injection 75 mL (75 mLs Intravenous Contrast Given 04/16/23 1644)     IMPRESSION / MDM / ASSESSMENT AND PLAN / ED COURSE  I reviewed the triage vital signs and the nursing notes.   Patient's presentation is most consistent with acute presentation with potential threat to life or bodily function.   Patient comes in with acute onset of left-sided weakness within a stroke code window therefore stroke code was called from the EMS.  Patient was seen by neurology with the telemetry specialist.  Please see his note  for separate dictation.  Patient denies any falls and denies any chest pain to suggest dissection.  This seems consistent with patient's prior stroke.  Neurology did recommend TNK patient's blood pressures were in range of TNK was given.  CTA without evidence of LVO  CBC normal hemoglobin coags normal differential normal.  I will discuss with the ICU team for admission due to concern for new acute stroke  The patient is on the cardiac monitor to evaluate for evidence of arrhythmia and/or significant heart rate changes.      FINAL CLINICAL IMPRESSION(S) / ED DIAGNOSES   Final diagnoses:  Cerebrovascular accident (CVA), unspecified mechanism (HCC)     Rx / DC Orders   ED Discharge Orders     None        Note:  This document was prepared using Dragon voice recognition software and may include unintentional dictation errors.   Concha Se, MD 04/16/23 [phone]

## 2023-04-16 NOTE — ED Triage Notes (Signed)
 Patient presents via [facility] from home for c/o left sided weakness starting at approx 1530. Pt reports gradually increasing weakness that caused him to almost fall.   Per EMS, pt LVO score 3.  Recent stroke in October 2024 with left sided residual, but pt reports being able to complete ADLs with minor weakness on left side.   Does not take blood thinners.

## 2023-04-16 NOTE — Progress Notes (Signed)
 CODE STROKE- PHARMACY COMMUNICATION   Time CODE STROKE called/page received:1625  Time response to CODE STROKE was made (in person or via phone): 1630  Time Stroke Kit retrieved from [employer] (only if needed):1630 TNKase given 3/13 @ 1651  Name of Provider/Nurse contacted:Dr. Pearlean Brownie  Past Medical History:  Diagnosis Date   Abnormal LFTs    Anxiety    Atypical chest pain    Borderline diabetes    CAD (coronary artery disease)    a.) PCI and stent placement (unknown type) to mLAD and mLCx; date unknown. b.) LHC 09/10/2010: 40% mLAD; no intervention. c.) LHC 09/12/2011: EF 60%; 30% ISR mLAD, 40% dLAD, 30% pLCx, 30% ISR mLCx, 40% mRCA; no intervention. d.) LHC 05/23/2014: EF >55%; 30% p-mLAD; no intervention. e.) LHC 06/11/2016: EF 55-65%; LVEDP norm; 30-40% p-mLAD; no intervention.   Carcinoma (HCC)    Chronic pain syndrome    Depression    Fundic gland polyps of stomach, benign    Gastritis    GERD (gastroesophageal reflux disease)    Hepatic steatosis    Hyperlipidemia    Hypertension    Migraines    Myocardial infarction [facility]) 2008   was told by PCP   Nonischemic cardiomyopathy (HCC)    a.) TTE 09/12/2011: EF 35-45%. b.) LHC 09/12/2011: EF 60%. c.) TTE 06/04/2012: EF 55-60%. d.) LHC 05/23/2014: EF >55%. e.) TTE 06/11/2016: EF 55-60%; G1DD. f.) LHC 06/11/2016: EF 55-65%. g.) TTE 01/08/2018: EF 60-65%.   Obesity    Occlusion and stenosis of bilateral carotid arteries    OSA on CPAP    Osteoarthritis of both knees    Shortness of breath    Squamous cell carcinoma of skin 02/27/2020   left distal lat deltoid - [facility]   Prior to Admission medications   Medication Sig Start Date End Date Taking? Authorizing Provider  aspirin EC 81 MG tablet Take 1 tablet (81 mg total) by mouth daily. Swallow whole. 11/12/22 11/12/23  Lurene Shadow, MD  citalopram (CELEXA) 40 MG tablet TAKE 1 TABLET BY MOUTH EVERY DAY 04/13/23   McDonough, Salomon Fick, PA-C  cyclobenzaprine (FLEXERIL) 10 MG tablet Take 1  tablet (10 mg total) by mouth at bedtime. Take one tab po qhs for back spasm prn only 04/13/23   McDonough, Lauren K, PA-C  ezetimibe (ZETIA) 10 MG tablet Take 1 tablet (10 mg total) by mouth daily. 11/13/22   Lurene Shadow, MD  furosemide (LASIX) 20 MG tablet TAKE 1 TABLET BY MOUTH EVERY DAY AS NEEDED FOR LEG SWELLING 07/21/22   McDonough, Salomon Fick, PA-C  gabapentin (NEURONTIN) 300 MG capsule Take 1 capsule (300 mg total) by mouth 2 (two) times daily. 11/12/22   Lurene Shadow, MD  lisinopril-hydrochlorothiazide (ZESTORETIC) 20-12.5 MG tablet Take 2 tablets by mouth daily.    [provider]  Multiple Vitamins-Minerals (MULTIVITAMIN ADULTS PO) Take 1 tablet by mouth daily.    [provider]  nitroGLYCERIN (NITROSTAT) 0.4 MG SL tablet Place 1 tablet (0.4 mg total) under the tongue every 5 (five) minutes x 3 doses as needed for chest pain. 04/25/21   Marrion Coy, MD  Semaglutide,0.25 or 0.5MG /DOS, 2 MG/3ML SOPN Inject 0.25 mg into the skin once a week. 04/13/23   McDonough, Salomon Fick, PA-C    Barrie Folk ,PharmD Clinical Pharmacist  04/16/2023  4:58 PM

## 2023-04-16 NOTE — ED Notes (Signed)
 Patient reporting worsening headache at this time. This is a change from headache that patient arrived with. Dr. Larinda Buttery notified. Approx 1 minute later, patient notified this RN of new onset right thigh numbness. Denied numbness anywhere else on right side. Dr. Larinda Buttery also notified of this change.   Head CT ordered and patient taken immediately to CT.

## 2023-04-16 NOTE — Progress Notes (Signed)
 On call neurology note  Called by PCCM APP - pt c/o headache and rt leg weakness. STAT CTH done and reviewed - no bleed. Continue current post TNK management. Tylenol for pain. Neurology will follow with you. -- Milon Dikes, MD Neurology  Additional 10 min CC time

## 2023-04-16 NOTE — Progress Notes (Signed)
 [facility] Telemedicine Consult   Requesting Provider: Dr Fuller Plan Consult Participants: Patient, bedside RN and atrium RN Location of the provider: [facility] Location of the patient: [facility] ED This consult was provided via telemedicine with 2-way video and audio communication. The patient/family was informed that care would be provided in this way and agreed to receive care in this manner.    Chief Complaint: Left-sided weakness and numbness  HPI: Gerald [name] is a 58 year old Caucasian male seen today for teleneurology consult for code stroke.  He has past medical history of morbid obesity, hypertension, diabetes, hyperlipidemia, obstructive sleep apnea and coronary artery disease.  Patient states he developed sudden onset of left-sided weakness and numbness today and 1530.  He states his symptoms are quite similar to previous stroke he had in October 2024 when he presented with similar symptoms.  He received IV TNK at that time and MRI showed right thalamic lacunar infarct.  Patient states he had done well from that he was for pulmonary complete recovery.  Symptoms recurred today afternoon.  He denies any headache, facial weakness and states his speech is slurred.    LKW: 1530 tnk given?: yes IR Thrombectomy? No,  as no LVO Modified Rankin Scale: 1-No significant post stroke disability and can perform usual duties with stroke symptoms Time of teleneurologist evaluation: 1615  Exam: There were no vitals filed for this visit.  General: Morbidly obese middle-age Caucasian male not in distress. He is awake alert and interactive.  He has no dysarthria or aphasia voice is hypophonic clear and easy to understand.  Follows commands well. Extraocular movements are full range without nystagmus.  Blinks to threat bilaterally.  Face is symmetric without weakness.  2021. Motor system exam shows no upper extremity drift but weakness of left putamen with some emesis.  Mild left  finger-to-nose dysmetria.  Lower extremity weakness 2/5 strength with variable effort..  Normal strength on the right. 1A: Level of Consciousness - 0 1B: Ask Month and Age - 0 1C: 'Blink Eyes' & 'Squeeze Hands' - 0 2: Test Horizontal Extraocular Movements - 0 3: Test Visual Fields - 0 4: Test Facial Palsy - 0 5A: Test Left Arm Motor Drift - 0 5B: Test Right Arm Motor Drift - 0 6A: Test Left Leg Motor Drift - 3 6B: Test Right Leg Motor Drift - 0 7: Test Limb Ataxia - 1 8: Test Sensation - 1 9: Test Language/Aphasia- 0 10: Test Dysarthria - 0 11: Test Extinction/Inattention - 0 NIHSS score: 5   Imaging Reviewed: CT head noncontrast study shows mild changes of small vessel disease.  No acute abnormality. CT angiogram brain and neck my accumulatively read shows no LVO.  Official radiology report pending Labs reviewed in epic and pertinent values follow: Blood glucose 179 mg percent.   Assessment: 58 year old Caucasian male presents with sudden onset of left hemiparesis and numbness likely due to acute right subcortical infarct.  Patient has multiple vascular risk factors of obesity, diabetes, hypertension, hyperlipidemia, sleep apnea coronary artery disease and recent subcortical stroke in October 2024.  Recommendations: Patient has presented within time window for thrombolysis and has significant physical deficits hence will benefit with consideration for IV thrombolysis with TNK.  I have discussed risk benefits alternatives with the patient's and reviewed exclusion criteria and he does not have significant exclusion except for elective history of minor rectal bleeding yesterday when he cleaned himself but subsequently has had no further bleeding.  I have explained to him that he  has high risk for rectal bleeding with TNK but this can be managed but if it does not get better we may have significant disability.  He understands and wants to proceed with TNK. Administer IV TNK as per protocol   peras no LVO pharmacy. Check CT angiogram of the brain and neck no LVO admit to ICU at [facility] and the medical team.  Use post TNK stroke order set and follow protocol with vital signs and neurochecks as per protocol. 24-hour brain imaging with CT/MRI.  Strict control of blood pressure as per post TNK protocol 2D echocardiogram, telemetry monitoring, check lipid profile, hemoglobin A1c. Neurohospitalist team to follow-up on consults Discussed with Dr. Fuller Plan ER MD.    This patient is receiving care for possible acute neurological changes. There was 55 minutes of care by this provider at the time of service, including time for direct evaluation via telemedicine, review of medical records, imaging studies and discussion of findings with providers, the patient and/or family.  Gerald Heady, MD [facility] [phone]  If 7pm- 7am, please page neurology on call as listed in AMION.

## 2023-04-16 NOTE — H&P (Signed)
 NAME:  Gerald [name], MRN:  [id number], DOB:  1965/12/29, LOS: 0 ADMISSION DATE:  04/16/2023, CONSULTATION DATE:  04/16/23 REFERRING MD:  Dr. Fuller Plan, CHIEF COMPLAINT:  Acute onset Left sided weakness/numbness   Brief Pt Description / Synopsis:  58 y.o. male with PMHx of CVA, morbid obesity, hypertension, diabetes, hyperlipidemia, obstructive sleep apnea and coronary artery disease.  presenting with acute onset of Left sided weakness and numbness concerning for Acute Ischemic CVA, status post TNK.  History of Present Illness:  Gerald [name] is a 58 year old male with a past medical history significant for right sided thalamic lacunar infarct in October 2024, morbid obesity, hypertension, diabetes, hyperlipidemia, obstructive sleep apnea and coronary artery disease who presents to [facility] ED on 04/16/23 due to sudden onset of left-sided weakness and numbness today at 15:30.   He reports his symptoms are quite similar to previous stroke he had in October 2024 when he presented with similar symptoms. Symptoms recurred this afternoon, along with report of associated headache, slightly slurred speech, and palpitations. He denies any facial weakness, chest pain, shortness of breath,   ED Course: Initial Vital Signs: Temperature 98.5 F orally, pulse 74, respiratory rate 17, blood pressure 141/61, SpO2 100% on room air Significant Labs: Sodium 134, potassium 3.2, glucose 168, BUN 44, creatinine 1.0 Imaging CT Head w/o Contrast>>IMPRESSION: 1. No CT evidence of acute intracranial abnormality. 2. Mild chronic microvascular ischemic changes. Small remote lacunar infarcts in the right thalamus and right basal ganglia. 3. ASPECTS is 10 CTa Head & Neck>>IMPRESSION: 1. No large vessel occlusion. 2. Similar moderate stenosis of the left PCA origin and right PCA anterior P2 segment. 3. No significant stenosis of the cervical carotid or vertebral arteries. 4. Mild atherosclerosis of the right  cavernous ICA without significant stenosis. Medications Administered: TNK @ 16:51 on 04/16/23  Code Stroke was called and patient was evaluated by Neurology.  Decision made to administer TNK.  PCCM asked to admit for further workup and treatment.  Please see "Significant Hospital Events" section below for full detailed hospital course.   Pertinent  Medical History   Past Medical History:  Diagnosis Date   Abnormal LFTs    Anxiety    Atypical chest pain    Borderline diabetes    CAD (coronary artery disease)    a.) PCI and stent placement (unknown type) to mLAD and mLCx; date unknown. b.) LHC 09/10/2010: 40% mLAD; no intervention. c.) LHC 09/12/2011: EF 60%; 30% ISR mLAD, 40% dLAD, 30% pLCx, 30% ISR mLCx, 40% mRCA; no intervention. d.) LHC 05/23/2014: EF >55%; 30% p-mLAD; no intervention. e.) LHC 06/11/2016: EF 55-65%; LVEDP norm; 30-40% p-mLAD; no intervention.   Carcinoma (HCC)    Chronic pain syndrome    Depression    Fundic gland polyps of stomach, benign    Gastritis    GERD (gastroesophageal reflux disease)    Hepatic steatosis    Hyperlipidemia    Hypertension    Migraines    Myocardial infarction [facility]) 2008   was told by PCP   Nonischemic cardiomyopathy (HCC)    a.) TTE 09/12/2011: EF 35-45%. b.) LHC 09/12/2011: EF 60%. c.) TTE 06/04/2012: EF 55-60%. d.) LHC 05/23/2014: EF >55%. e.) TTE 06/11/2016: EF 55-60%; G1DD. f.) LHC 06/11/2016: EF 55-65%. g.) TTE 01/08/2018: EF 60-65%.   Obesity    Occlusion and stenosis of bilateral carotid arteries    OSA on CPAP    Osteoarthritis of both knees    Shortness of breath    Squamous cell carcinoma  of skin 02/27/2020   left distal lat deltoid - EDC    Micro Data:  N/A  Antimicrobials:   Anti-infectives (From admission, onward)    None       Significant Hospital Events: Including procedures, antibiotic start and stop dates in addition to other pertinent events   3/13: Presented to ED with acute onset left sided  weakness, code stroke initiated, evaluated by Teleneurology, given TNK.  PCCM asked to admit.  Post PCCM assessment, pt complaining of worsening headache and right leg numbness, repeat STAT Head CT without bleed.  Neurology following.  Interim History / Subjective:  As outlined above under significant hospital events section  Objective   Blood pressure (!) 141/61, pulse 76, temperature 98.4 F (36.9 C), resp. rate (!) 22, height 6\' 2"  (1.88 m), weight (!) 141.6 kg, SpO2 94%.       No intake or output data in the 24 hours ending 04/16/23 1720 Filed Weights   04/16/23 1705  Weight: (!) 141.6 kg    Examination: General: Acute on chronically ill-appearing obese male, laying in bed, in no acute distress HENT: Atraumatic, normocephalic, neck supple, no JVD Lungs: Clear breath sounds throughout, even, nonlabored, normal effort Cardiovascular: Regular rate and rhythm, S1-S2, no murmurs, rubs, gallops Abdomen: Obese, soft, nondistended, slightly tender, bowel sounds positive x 4 Extremities: Normal bulk and tone, no deformities, trace edema bilateral lower extremities Neuro: Awake and alert, oriented x 4, left-sided weakness and numbness, pupils PERRLA GU: Deferred  Resolved Hospital Problem list     Assessment & Plan:   #Acute onset of Left sided Weakness/Numbness #Concern for Acute Ischemic CVA, status post TNK @ 16:51 on 04/16/23 PMHx: right Thalamic lacunar CVA, peripheral neuropathy, HTN, HLD -CT Head on presentation negative for acute intracranial abnormality -CTa Head & Neck negative for large vessel occlusion -ICU monitoring -Frequent neurochecks and NIHSS scores per post-TNK protocol -Neurology following, appreciate input -Obtain stat head CT for any change in neurological exam -MRI brain pending 24 hours post TNKase to rule out hemorrhagic conversion -No antiplatelets or anticoagulation for 24 hours post TNKase, until ICH ruled out on repeat imaging at 24 hours -Maintain SBP  <180 and/or DBP <105 -As needed labetalol and nicardipine drip if needed to maintain blood pressure goal -Hold home Lisinopril-hydrochlorothiazide for now -SCDs for DVT prophylaxis -N.p.o., bedside swallow study pending -PT/OT/speech therapy consult -Check fasting lipid panel, Hgb A1c 9.1 on 04/13/23 -Echocardiogram is pending -Check UDS  #Mild Hyponatremia #Hypokalemia -Monitor I&O's / urinary output -Follow BMP -Ensure adequate renal perfusion -Avoid nephrotoxic agents as able -Replace electrolytes as indicated ~ Pharmacy following for assistance with electrolyte replacement -IV fluids  #OSA on CPAP -Supplemental O2 as needed to maintain O2 sats >92% -CPAP qhs -Follow intermittent Chest X-ray & ABG as needed -Bronchodilators prn -Pulmonary toilet as able  #Diabetes Mellitus Type II Hgb A1c 9.1 on 04/13/23 -CBG's ac & qhs; Target range of 140 to 180 -SSI -Follow ICU Hypo/Hyperglycemia protocol          Best Practice (right click and "Reselect all SmartList Selections" daily)   Diet/type: NPO until bedside swallow eval performed DVT prophylaxis: SCD GI prophylaxis: PPI Lines: N/A Foley:  N/A Code Status:  full code Last date of multidisciplinary goals of care discussion [N/A]  3/13: Pt and his wife updated at bedside on plan of care.  Labs   CBC: Recent Labs  Lab 04/16/23 1632  WBC 8.4  NEUTROABS 4.2  HGB 14.9  HCT 41.9  MCV 85.7  PLT 255    Basic Metabolic Panel: Recent Labs  Lab 04/16/23 1632  NA 134*  K 3.2*  CL 96*  CO2 25  GLUCOSE 168*  BUN 44*  CREATININE 1.00  CALCIUM 9.4   GFR: Estimated Creatinine Clearance: 123.7 mL/min (by C-G formula based on SCr of 1 mg/dL). Recent Labs  Lab 04/16/23 1632  WBC 8.4    Liver Function Tests: Recent Labs  Lab 04/16/23 1632  AST 35  ALT 42  ALKPHOS 69  BILITOT 1.3*  PROT 7.7  ALBUMIN 4.2   No results for input(s): "LIPASE", "AMYLASE" in the last 168 hours. No results for input(s):  "AMMONIA" in the last 168 hours.  ABG    Component Value Date/Time   PHART 7.47 (H) 10/18/2015 0039   PCO2ART 40 10/18/2015 0039   PO2ART 72 (L) 10/18/2015 0039   HCO3 29.1 (H) 10/18/2015 0039   TCO2 24 07/17/2017 1647   O2SAT 95.0 10/18/2015 0054     Coagulation Profile: Recent Labs  Lab 04/16/23 1632  INR 1.1    Cardiac Enzymes: No results for input(s): "CKTOTAL", "CKMB", "CKMBINDEX", "TROPONINI" in the last 168 hours.  HbA1C: Hemoglobin A1C  Date/Time Value Ref Range Status  04/13/2023 10:47 AM 9.1 (A) 4.0 - 5.6 % Final  09/01/2022 11:03 AM 9.5 (A) 4.0 - 5.6 % Final  05/23/2014 02:25 AM 7.1 (H) % Final    Comment:    4.0-6.0 NOTE: New Reference Range  04/11/14   09/13/2011 03:07 AM 6.8 (H) 4.2 - 6.3 % Final    Comment:    The American Diabetes Association recommends that a primary goal of therapy should be <7% and that physicians should reevaluate the treatment regimen in patients with HbA1c values consistently >8%.    Hgb A1c MFr Bld  Date/Time Value Ref Range Status  11/12/2022 04:49 AM 8.4 (H) 4.8 - 5.6 % Final    Comment:    (NOTE) Pre diabetes:          5.7%-6.4%  Diabetes:              >6.4%  Glycemic control for   <7.0% adults with diabetes   09/10/2021 08:42 AM 6.5 (H) 4.8 - 5.6 % Final    Comment:             Prediabetes: 5.7 - 6.4          Diabetes: >6.4          Glycemic control for adults with diabetes: <7.0     CBG: Recent Labs  Lab 04/16/23 1628  GLUCAP 179*    Review of Systems:   Positives in BOLD: Gen: Denies fever, chills, weight change, fatigue, night sweats HEENT: Denies blurred vision, double vision, hearing loss, tinnitus, sinus congestion, rhinorrhea, sore throat, neck stiffness, dysphagia PULM: Denies shortness of breath, cough, sputum production, hemoptysis, wheezing CV: Denies chest pain, edema, orthopnea, paroxysmal nocturnal dyspnea, palpitations GI: Denies abdominal pain, nausea, vomiting, diarrhea,  hematochezia, melena, constipation, change in bowel habits GU: Denies dysuria, hematuria, polyuria, oliguria, urethral discharge Endocrine: Denies hot or cold intolerance, polyuria, polyphagia or appetite change Derm: Denies rash, dry skin, scaling or peeling skin change Heme: Denies easy bruising, bleeding, bleeding gums Neuro: Denies headache, Left sided numbness/weakness, slurred speech, loss of memory or consciousness   Past Medical History:  He,  has a past medical history of Abnormal LFTs, Anxiety, Atypical chest pain, Borderline diabetes, CAD (coronary artery disease), Carcinoma (HCC), Chronic pain syndrome, Depression, Fundic gland polyps of  stomach, benign, Gastritis, GERD (gastroesophageal reflux disease), Hepatic steatosis, Hyperlipidemia, Hypertension, Migraines, Myocardial infarction (HCC) (2008), Nonischemic cardiomyopathy (HCC), Obesity, Occlusion and stenosis of bilateral carotid arteries, OSA on CPAP, Osteoarthritis of both knees, Shortness of breath, and Squamous cell carcinoma of skin (02/27/2020).   Surgical History:   Past Surgical History:  Procedure Laterality Date   BONE EXCISION Right 11/21/2020   Procedure: PART EXCISION BONE- PHALANX;  Surgeon: Gwyneth Revels, DPM;  Location: [facility];  Service: Podiatry;  Laterality: Right;   CARDIAC CATHETERIZATION N/A 09/2011   [facility]; EF 50% with 30% mid LAD stenosis and no obstructive disease.   CARDIAC CATHETERIZATION  09/2010   [facility]; Mid LAD 40% stenosis; Mid Circumflex:Normal; Mid RCA; Normal   CARDIAC CATHETERIZATION     COLONOSCOPY     COLONOSCOPY WITH PROPOFOL N/A 04/10/2021   Procedure: COLONOSCOPY WITH PROPOFOL;  Surgeon: Wyline Mood, MD;  Location: [facility] ENDOSCOPY;  Service: Gastroenterology;  Laterality: N/A;   ESOPHAGOGASTRODUODENOSCOPY N/A 04/10/2021   Procedure: ESOPHAGOGASTRODUODENOSCOPY (EGD);  Surgeon: Wyline Mood, MD;  Location: [facility] ENDOSCOPY;  Service: Gastroenterology;  Laterality: N/A;    ESOPHAGOGASTRODUODENOSCOPY (EGD) WITH PROPOFOL N/A 06/10/2017   Procedure: ESOPHAGOGASTRODUODENOSCOPY (EGD) WITH PROPOFOL;  Surgeon: Wyline Mood, MD;  Location: [facility] ENDOSCOPY;  Service: Gastroenterology;  Laterality: N/A;   ESOPHAGOGASTRODUODENOSCOPY (EGD) WITH PROPOFOL N/A 01/01/2022   Procedure: ESOPHAGOGASTRODUODENOSCOPY (EGD) WITH PROPOFOL;  Surgeon: Wyline Mood, MD;  Location: [facility] ENDOSCOPY;  Service: Gastroenterology;  Laterality: N/A;   ESOPHAGOGASTRODUODENOSCOPY (EGD) WITH PROPOFOL N/A 01/06/2022   Procedure: ESOPHAGOGASTRODUODENOSCOPY (EGD) WITH PROPOFOL;  Surgeon: Wyline Mood, MD;  Location: [facility] ENDOSCOPY;  Service: Gastroenterology;  Laterality: N/A;   HAMMER TOE SURGERY Right 11/21/2020   Procedure: HAMMERTOE CORRECTION;  Surgeon: Gwyneth Revels, DPM;  Location: [facility];  Service: Podiatry;  Laterality: Right;   INSERTION OF MESH  04/16/2021   Procedure: INSERTION OF MESH;  Surgeon: Henrene Dodge, MD;  Location: [facility] ORS;  Service: General;;   KNEE ARTHROSCOPY WITH MEDIAL MENISECTOMY Right 09/16/2012   Procedure: RIGHT KNEE ARTHROSCOPY WITH MEDIAL AND LATERAL MENISECTOMY, CHONDROPLASTY;  Surgeon: Loreta Ave, MD;  Location: [facility];  Service: Orthopedics;  Laterality: Right;  RIGHT KNEE SCOPE MEDIAL MENISCECTOMY   LEFT HEART CATH AND CORONARY ANGIOGRAPHY N/A 06/11/2016   Procedure: Left Heart Cath and Coronary Angiography;  Surgeon: Antonieta Iba, MD;  Location: [facility] INVASIVE CV LAB;  Service: Cardiovascular;  Laterality: N/A;   UMBILICAL HERNIA REPAIR N/A 04/16/2021   Procedure: HERNIA REPAIR UMBILICAL ADULT, open;  Surgeon: Henrene Dodge, MD;  Location: [facility] ORS;  Service: General;  Laterality: N/A;     Social History:   reports that he has never smoked. He has been exposed to tobacco smoke. He quit smokeless tobacco use about 38 years ago.  His smokeless tobacco use included chew. He reports that he does not currently use alcohol. He reports that he  does not use drugs.   Family History:  His family history includes Breast cancer in his mother; Heart attack in his father; Heart disease (age of onset: 54) in his father; Hypertension in his mother; Stomach cancer in his father.   Allergies Allergies  Allergen Reactions   Statins Hives and Rash     Home Medications  Prior to Admission medications   Medication Sig Start Date End Date Taking? Authorizing Provider  aspirin EC 81 MG tablet Take 1 tablet (81 mg total) by mouth daily. Swallow whole. 11/12/22 11/12/23  Lurene Shadow, MD  citalopram (CELEXA) 40 MG tablet  TAKE 1 TABLET BY MOUTH EVERY DAY 04/13/23   McDonough, Salomon Fick, PA-C  cyclobenzaprine (FLEXERIL) 10 MG tablet Take 1 tablet (10 mg total) by mouth at bedtime. Take one tab po qhs for back spasm prn only 04/13/23   McDonough, Lauren K, PA-C  ezetimibe (ZETIA) 10 MG tablet Take 1 tablet (10 mg total) by mouth daily. 11/13/22   Lurene Shadow, MD  furosemide (LASIX) 20 MG tablet TAKE 1 TABLET BY MOUTH EVERY DAY AS NEEDED FOR LEG SWELLING 07/21/22   McDonough, Salomon Fick, PA-C  gabapentin (NEURONTIN) 300 MG capsule Take 1 capsule (300 mg total) by mouth 2 (two) times daily. 11/12/22   Lurene Shadow, MD  lisinopril-hydrochlorothiazide (ZESTORETIC) 20-12.5 MG tablet Take 2 tablets by mouth daily.    [provider]  Multiple Vitamins-Minerals (MULTIVITAMIN ADULTS PO) Take 1 tablet by mouth daily.    [provider]  nitroGLYCERIN (NITROSTAT) 0.4 MG SL tablet Place 1 tablet (0.4 mg total) under the tongue every 5 (five) minutes x 3 doses as needed for chest pain. 04/25/21   Marrion Coy, MD  Semaglutide,0.25 or 0.5MG /DOS, 2 MG/3ML SOPN Inject 0.25 mg into the skin once a week. 04/13/23   Carlean Jews, PA-C     Critical care time: 55 minutes     Harlon Ditty, AGACNP-BC [facility] Prefer epic messenger for cross cover needs If after hours, please call E-link

## 2023-04-16 NOTE — ED Notes (Signed)
 1630 Code stroke cart activated, pt in CT. LKW 1530, L side weakness, HA. Pt had CVA in Oct last year, residual L leg minor weakness-on ASA at home. Pt so weak today could hardly walk per wife. BP 141/61 glucose 179. 1632 Dr Pearlean Brownie on camera, site paged teleneuro. Pt assessed in CT. When asked if any recent rectal bleeding, pt states yesterday he had rectal bleeding, enough to cover toilet paper when he wiped. Bedside and pt told Dr Pearlean Brownie twice.  1650 TNK ordered 1651 25mg  TNK given in CT 1655 pt back from CT

## 2023-04-17 ENCOUNTER — Ambulatory Visit: Admitting: Physician Assistant

## 2023-04-17 ENCOUNTER — Other Ambulatory Visit

## 2023-04-17 ENCOUNTER — Inpatient Hospital Stay

## 2023-04-17 DIAGNOSIS — R531 Weakness: Secondary | ICD-10-CM

## 2023-04-17 DIAGNOSIS — Z8673 Personal history of transient ischemic attack (TIA), and cerebral infarction without residual deficits: Secondary | ICD-10-CM

## 2023-04-17 DIAGNOSIS — Z91148 Patient's other noncompliance with medication regimen for other reason: Secondary | ICD-10-CM | POA: Diagnosis not present

## 2023-04-17 DIAGNOSIS — R200 Anesthesia of skin: Secondary | ICD-10-CM

## 2023-04-17 LAB — CBC
HCT: 41.7 % (ref 39.0–52.0)
Hemoglobin: 14.6 g/dL (ref 13.0–17.0)
MCH: 29.6 pg (ref 26.0–34.0)
MCHC: 35 g/dL (ref 30.0–36.0)
MCV: 84.4 fL (ref 80.0–100.0)
Platelets: 243 10*3/uL (ref 150–400)
RBC: 4.94 MIL/uL (ref 4.22–5.81)
RDW: 13.6 % (ref 11.5–15.5)
WBC: 7.7 10*3/uL (ref 4.0–10.5)
nRBC: 0 % (ref 0.0–0.2)

## 2023-04-17 LAB — RENAL FUNCTION PANEL
Albumin: 3.8 g/dL (ref 3.5–5.0)
Anion gap: 12 (ref 5–15)
BUN: 31 mg/dL — ABNORMAL HIGH (ref 6–20)
CO2: 23 mmol/L (ref 22–32)
Calcium: 9.2 mg/dL (ref 8.9–10.3)
Chloride: 100 mmol/L (ref 98–111)
Creatinine, Ser: 0.73 mg/dL (ref 0.61–1.24)
GFR, Estimated: >60 mL/min (ref >60)
Glucose, Bld: 141 mg/dL — ABNORMAL HIGH (ref 70–99)
Phosphorus: 3.2 mg/dL (ref 2.5–4.6)
Potassium: 3.2 mmol/L — ABNORMAL LOW (ref 3.5–5.1)
Sodium: 135 mmol/L (ref 135–145)

## 2023-04-17 LAB — GLUCOSE, CAPILLARY
Glucose-Capillary: 170 mg/dL — ABNORMAL HIGH (ref 70–99)
Glucose-Capillary: 158 mg/dL — ABNORMAL HIGH (ref 70–99)
Glucose-Capillary: 224 mg/dL — ABNORMAL HIGH (ref 70–99)
Glucose-Capillary: 147 mg/dL — ABNORMAL HIGH (ref 70–99)
Glucose-Capillary: 168 mg/dL — ABNORMAL HIGH (ref 70–99)

## 2023-04-17 LAB — LIPID PANEL
Cholesterol: 262 mg/dL — ABNORMAL HIGH (ref 0–200)
HDL: 34 mg/dL — ABNORMAL LOW (ref >40)
LDL Cholesterol: 182 mg/dL — ABNORMAL HIGH (ref 0–99)
Total CHOL/HDL Ratio: 7.7 ratio
Triglycerides: 232 mg/dL — ABNORMAL HIGH (ref <150)
VLDL: 46 mg/dL — ABNORMAL HIGH (ref 0–40)

## 2023-04-17 LAB — MAGNESIUM: Magnesium: 2.3 mg/dL (ref 1.7–2.4)

## 2023-04-17 MED ORDER — GABAPENTIN 300 MG PO CAPS
300.0000 mg | ORAL_CAPSULE | Freq: Three times a day (TID) | ORAL | Status: DC
  Administered 2023-04-17 – 2023-04-18 (×3): 300 mg via ORAL
  Filled 2023-04-17 (×3): qty 1

## 2023-04-17 MED ORDER — CITALOPRAM HYDROBROMIDE 20 MG PO TABS
40.0000 mg | ORAL_TABLET | Freq: Every day | ORAL | Status: DC
  Administered 2023-04-17 – 2023-04-18 (×2): 40 mg via ORAL
  Filled 2023-04-17 (×2): qty 2

## 2023-04-17 MED ORDER — POTASSIUM CHLORIDE 10 MEQ/100ML IV SOLN
10.0000 meq | INTRAVENOUS | Status: AC
Start: 2023-04-17 — End: 2023-04-17
  Administered 2023-04-17 (×6): 10 meq via INTRAVENOUS
  Filled 2023-04-17 (×6): qty 100

## 2023-04-17 MED ORDER — ALPRAZOLAM 0.5 MG PO TABS
1.0000 mg | ORAL_TABLET | Freq: Once | ORAL | Status: AC
  Administered 2023-04-17: 1 mg via ORAL
  Filled 2023-04-17: qty 2

## 2023-04-17 MED ORDER — LAMOTRIGINE 25 MG PO TABS
25.0000 mg | ORAL_TABLET | Freq: Every day | ORAL | Status: DC
  Administered 2023-04-17: 25 mg via ORAL
  Filled 2023-04-17: qty 1

## 2023-04-17 MED ORDER — CHLORHEXIDINE GLUCONATE CLOTH 2 % EX PADS
6.0000 | MEDICATED_PAD | Freq: Every day | CUTANEOUS | Status: DC
  Administered 2023-04-17: 6 via TOPICAL

## 2023-04-17 MED ORDER — LAMOTRIGINE 25 MG PO TABS: 25.0000 mg | ORAL_TABLET | Freq: Two times a day (BID) | ORAL | Status: DC

## 2023-04-17 MED ORDER — ROSUVASTATIN CALCIUM 10 MG PO TABS
5.0000 mg | ORAL_TABLET | Freq: Every day | ORAL | Status: DC
  Administered 2023-04-17: 5 mg via ORAL
  Filled 2023-04-17: qty 1

## 2023-04-17 NOTE — Plan of Care (Signed)
  Problem: Education: Goal: Knowledge of disease or condition will improve 04/17/2023 0617 by Mena Goes, Chipper Oman, RN Outcome: Progressing 04/17/2023 0517 by Mena Goes, Chipper Oman, RN Outcome: Progressing Goal: Knowledge of secondary prevention will improve (MUST DOCUMENT ALL) Outcome: Progressing Goal: Knowledge of patient specific risk factors will improve (DELETE if not current risk factor) Outcome: Progressing   Problem: Ischemic Stroke/TIA Tissue Perfusion: Goal: Complications of ischemic stroke/TIA will be minimized 04/17/2023 0617 by Alveda Reasons, RN Outcome: Progressing 04/17/2023 0517 by Alveda Reasons, RN Outcome: Progressing   Problem: Health Behavior/Discharge Planning: Goal: Ability to manage health-related needs will improve Outcome: Progressing   Problem: Clinical Measurements: Goal: Diagnostic test results will improve Outcome: Progressing   Problem: Safety: Goal: Ability to remain free from injury will improve Outcome: Progressing

## 2023-04-17 NOTE — Progress Notes (Signed)
 NAME:  Gerald [name], MRN:  [id number], DOB:  11/27/1965, LOS: 1 ADMISSION DATE:  04/16/2023, CONSULTATION DATE:  04/16/23 REFERRING MD:  Dr. Fuller Plan, CHIEF COMPLAINT:  Acute onset Left sided weakness/numbness   Brief Pt Description / Synopsis:  58 y.o. male with PMHx of CVA, morbid obesity, hypertension, diabetes, hyperlipidemia, obstructive sleep apnea and coronary artery disease.  presenting with acute onset of Left sided weakness and numbness concerning for Acute Ischemic CVA, status post TNK.  History of Present Illness:  Gerald [name] is a 58 year old male with a past medical history significant for right sided thalamic lacunar infarct in October 2024, morbid obesity, hypertension, diabetes, hyperlipidemia, obstructive sleep apnea and coronary artery disease who presents to [facility] ED on 04/16/23 due to sudden onset of left-sided weakness and numbness today at 15:30.   He reports his symptoms are quite similar to previous stroke he had in October 2024 when he presented with similar symptoms. Symptoms recurred this afternoon, along with report of associated headache, slightly slurred speech, and palpitations. He denies any facial weakness, chest pain, shortness of breath,   ED Course: Initial Vital Signs: Temperature 98.5 F orally, pulse 74, respiratory rate 17, blood pressure 141/61, SpO2 100% on room air Significant Labs: Sodium 134, potassium 3.2, glucose 168, BUN 44, creatinine 1.0 Imaging CT Head w/o Contrast>>IMPRESSION: 1. No CT evidence of acute intracranial abnormality. 2. Mild chronic microvascular ischemic changes. Small remote lacunar infarcts in the right thalamus and right basal ganglia. 3. ASPECTS is 10 CTa Head & Neck>>IMPRESSION: 1. No large vessel occlusion. 2. Similar moderate stenosis of the left PCA origin and right PCA anterior P2 segment. 3. No significant stenosis of the cervical carotid or vertebral arteries. 4. Mild atherosclerosis of the right  cavernous ICA without significant stenosis. Medications Administered: TNK @ 16:51 on 04/16/23  Code Stroke was called and patient was evaluated by Neurology.  Decision made to administer TNK.  PCCM asked to admit for further workup and treatment.  Please see "Significant Hospital Events" section below for full detailed hospital course.   Pertinent  Medical History   Past Medical History:  Diagnosis Date   Abnormal LFTs    Anxiety    Atypical chest pain    Borderline diabetes    CAD (coronary artery disease)    a.) PCI and stent placement (unknown type) to mLAD and mLCx; date unknown. b.) LHC 09/10/2010: 40% mLAD; no intervention. c.) LHC 09/12/2011: EF 60%; 30% ISR mLAD, 40% dLAD, 30% pLCx, 30% ISR mLCx, 40% mRCA; no intervention. d.) LHC 05/23/2014: EF >55%; 30% p-mLAD; no intervention. e.) LHC 06/11/2016: EF 55-65%; LVEDP norm; 30-40% p-mLAD; no intervention.   Carcinoma (HCC)    Chronic pain syndrome    Depression    Fundic gland polyps of stomach, benign    Gastritis    GERD (gastroesophageal reflux disease)    Hepatic steatosis    Hyperlipidemia    Hypertension    Migraines    Myocardial infarction [facility]) 2008   was told by PCP   Nonischemic cardiomyopathy (HCC)    a.) TTE 09/12/2011: EF 35-45%. b.) LHC 09/12/2011: EF 60%. c.) TTE 06/04/2012: EF 55-60%. d.) LHC 05/23/2014: EF >55%. e.) TTE 06/11/2016: EF 55-60%; G1DD. f.) LHC 06/11/2016: EF 55-65%. g.) TTE 01/08/2018: EF 60-65%.   Obesity    Occlusion and stenosis of bilateral carotid arteries    OSA on CPAP    Osteoarthritis of both knees    Shortness of breath    Squamous cell carcinoma  of skin 02/27/2020   left distal lat deltoid - EDC    Micro Data:  N/A  Antimicrobials:   Anti-infectives (From admission, onward)    None       Significant Hospital Events: Including procedures, antibiotic start and stop dates in addition to other pertinent events   3/13: Presented to ED with acute onset left sided  weakness, code stroke initiated, evaluated by Teleneurology, given TNK.  PCCM asked to admit.  Post PCCM assessment, pt complaining of worsening headache and right leg numbness, repeat STAT Head CT without evidence of bleed.  Neurology following. 3/14: No significant events noted overnight. Neuro deficits greatly improved, pt reports back to baseline.  Awaiting Echo and MRI Brain this evening, PT/OT to evaluate once 24 hrs post TNK.  Interim History / Subjective:  -No significant event overnight -Afebrile, BP stable, not requiring antihypertensives -Pt reports headache is resolved, and his left sided deficits has resolved and he is at baseline function -Awaiting MRI 24 hrs post TNK ~ if no hemorrhagic conversion then pt can transfer out to Stroke unit   Objective   Blood pressure (!) 115/57, pulse 67, temperature 97.9 F (36.6 C), temperature source Oral, resp. rate (!) 22, height 6\' 2"  (1.88 m), weight (!) 141.6 kg, SpO2 93%.        Intake/Output Summary (Last 24 hours) at 04/17/2023 5409 Last data filed at 04/17/2023 8119 Gross per 24 hour  Intake 1485.84 ml  Output 1500 ml  Net -14.16 ml   Filed Weights   04/16/23 1705  Weight: (!) 141.6 kg    Examination: General: Acute on chronically ill-appearing obese male, laying in bed, in no acute distress HENT: Atraumatic, normocephalic, neck supple, no JVD Lungs: Clear breath sounds throughout, even, nonlabored, normal effort Cardiovascular: Regular rate and rhythm, S1-S2, no murmurs, rubs, gallops Abdomen: Obese, soft, nondistended, slightly tender, bowel sounds positive x 4 Extremities: Normal bulk and tone, no deformities, trace edema bilateral lower extremities Neuro: Awake and alert, oriented x 4, left-sided weakness/numbness resolved, pupils PERRLA GU: Deferred  Resolved Hospital Problem list     Assessment & Plan:   #Acute onset of Left sided Weakness/Numbness #Concern for Acute Ischemic CVA, status post TNK @ 16:51 on  04/16/23 PMHx: right Thalamic lacunar CVA, peripheral neuropathy, HTN, HLD -CT Head on presentation negative for acute intracranial abnormality -CTa Head & Neck negative for large vessel occlusion -ICU monitoring -Frequent neurochecks and NIHSS scores per post-TNK protocol -Neurology following, appreciate input -Obtain stat head CT for any change in neurological exam -MRI brain pending 24 hours post TNKase to rule out hemorrhagic conversion -No antiplatelets or anticoagulation for 24 hours post TNKase, until ICH ruled out on repeat imaging at 24 hours -Maintain SBP <180 and/or DBP <105 -As needed labetalol and nicardipine drip if needed to maintain blood pressure goal -Hold home Lisinopril-hydrochlorothiazide for now -SCDs for DVT prophylaxis -N.p.o., bedside swallow study pending -PT/OT/speech therapy consult -Fasting lipid panel: Total cholesterol 262/HLD 34/LDL 182/Triglycerides 232 -Hgb A1c 9.1 on 04/13/23 -Echocardiogram is pending -UDS negative  #Mild Hyponatremia ~ RESOLVED #Hypokalemia -Monitor I&O's / urinary output -Follow BMP -Ensure adequate renal perfusion -Avoid nephrotoxic agents as able -Replace electrolytes as indicated ~ Pharmacy following for assistance with electrolyte replacement -IV fluids  #OSA on CPAP -Supplemental O2 as needed to maintain O2 sats >92% -CPAP qhs -Follow intermittent Chest X-ray & ABG as needed -Bronchodilators prn -Pulmonary toilet as able  #Diabetes Mellitus Type II Hgb A1c 9.1 on 04/13/23 -CBG's ac & qhs;  Target range of 140 to 180 -SSI -Follow ICU Hypo/Hyperglycemia protocol          Best Practice (right click and "Reselect all SmartList Selections" daily)   Diet/type: Heart Healthy and carb modified diet DVT prophylaxis: SCD GI prophylaxis: PPI Lines: N/A Foley:  N/A Code Status:  full code Last date of multidisciplinary goals of care discussion [3/14]  3/14: Pt and his wife updated at bedside on plan of  care.  Labs   CBC: Recent Labs  Lab 04/16/23 1632 04/17/23 0403  WBC 8.4 7.7  NEUTROABS 4.2  --   HGB 14.9 14.6  HCT 41.9 41.7  MCV 85.7 84.4  PLT 255 243    Basic Metabolic Panel: Recent Labs  Lab 04/16/23 1632 04/17/23 0403  NA 134* 135  K 3.2* 3.2*  CL 96* 100  CO2 25 23  GLUCOSE 168* 141*  BUN 44* 31*  CREATININE 1.00 0.73  CALCIUM 9.4 9.2  MG  --  2.3  PHOS  --  3.2   GFR: Estimated Creatinine Clearance: 154.6 mL/min (by C-G formula based on SCr of 0.73 mg/dL). Recent Labs  Lab 04/16/23 1632 04/17/23 0403  WBC 8.4 7.7    Liver Function Tests: Recent Labs  Lab 04/16/23 1632 04/17/23 0403  AST 35  --   ALT 42  --   ALKPHOS 69  --   BILITOT 1.3*  --   PROT 7.7  --   ALBUMIN 4.2 3.8   No results for input(s): "LIPASE", "AMYLASE" in the last 168 hours. No results for input(s): "AMMONIA" in the last 168 hours.  ABG    Component Value Date/Time   PHART 7.47 (H) 10/18/2015 0039   PCO2ART 40 10/18/2015 0039   PO2ART 72 (L) 10/18/2015 0039   HCO3 29.1 (H) 10/18/2015 0039   TCO2 24 07/17/2017 1647   O2SAT 95.0 10/18/2015 0054     Coagulation Profile: Recent Labs  Lab 04/16/23 1632  INR 1.1    Cardiac Enzymes: No results for input(s): "CKTOTAL", "CKMB", "CKMBINDEX", "TROPONINI" in the last 168 hours.  HbA1C: Hemoglobin A1C  Date/Time Value Ref Range Status  04/13/2023 10:47 AM 9.1 (A) 4.0 - 5.6 % Final  09/01/2022 11:03 AM 9.5 (A) 4.0 - 5.6 % Final  05/23/2014 02:25 AM 7.1 (H) % Final    Comment:    4.0-6.0 NOTE: New Reference Range  04/11/14   09/13/2011 03:07 AM 6.8 (H) 4.2 - 6.3 % Final    Comment:    The American Diabetes Association recommends that a primary goal of therapy should be <7% and that physicians should reevaluate the treatment regimen in patients with HbA1c values consistently >8%.    Hgb A1c MFr Bld  Date/Time Value Ref Range Status  11/12/2022 04:49 AM 8.4 (H) 4.8 - 5.6 % Final    Comment:    (NOTE) Pre  diabetes:          5.7%-6.4%  Diabetes:              >6.4%  Glycemic control for   <7.0% adults with diabetes   09/10/2021 08:42 AM 6.5 (H) 4.8 - 5.6 % Final    Comment:             Prediabetes: 5.7 - 6.4          Diabetes: >6.4          Glycemic control for adults with diabetes: <7.0     CBG: Recent Labs  Lab 04/16/23 1628 04/16/23 1808  04/16/23 2132 04/17/23 0720  GLUCAP 179* 170* 140* 158*    Review of Systems:   Positives in BOLD: Pt currently without complaint Gen: Denies fever, chills, weight change, fatigue, night sweats HEENT: Denies blurred vision, double vision, hearing loss, tinnitus, sinus congestion, rhinorrhea, sore throat, neck stiffness, dysphagia PULM: Denies shortness of breath, cough, sputum production, hemoptysis, wheezing CV: Denies chest pain, edema, orthopnea, paroxysmal nocturnal dyspnea, palpitations GI: Denies abdominal pain, nausea, vomiting, diarrhea, hematochezia, melena, constipation, change in bowel habits GU: Denies dysuria, hematuria, polyuria, oliguria, urethral discharge Endocrine: Denies hot or cold intolerance, polyuria, polyphagia or appetite change Derm: Denies rash, dry skin, scaling or peeling skin change Heme: Denies easy bruising, bleeding, bleeding gums Neuro: Denies headache, Left sided numbness/weakness, slurred speech, loss of memory or consciousness   Past Medical History:  He,  has a past medical history of Abnormal LFTs, Anxiety, Atypical chest pain, Borderline diabetes, CAD (coronary artery disease), Carcinoma (HCC), Chronic pain syndrome, Depression, Fundic gland polyps of stomach, benign, Gastritis, GERD (gastroesophageal reflux disease), Hepatic steatosis, Hyperlipidemia, Hypertension, Migraines, Myocardial infarction (HCC) (2008), Nonischemic cardiomyopathy (HCC), Obesity, Occlusion and stenosis of bilateral carotid arteries, OSA on CPAP, Osteoarthritis of both knees, Shortness of breath, and Squamous cell carcinoma of  skin (02/27/2020).   Surgical History:   Past Surgical History:  Procedure Laterality Date   BONE EXCISION Right 11/21/2020   Procedure: PART EXCISION BONE- PHALANX;  Surgeon: Gwyneth Revels, DPM;  Location: [facility];  Service: Podiatry;  Laterality: Right;   CARDIAC CATHETERIZATION N/A 09/2011   [facility]; EF 50% with 30% mid LAD stenosis and no obstructive disease.   CARDIAC CATHETERIZATION  09/2010   [facility]; Mid LAD 40% stenosis; Mid Circumflex:Normal; Mid RCA; Normal   CARDIAC CATHETERIZATION     COLONOSCOPY     COLONOSCOPY WITH PROPOFOL N/A 04/10/2021   Procedure: COLONOSCOPY WITH PROPOFOL;  Surgeon: Wyline Mood, MD;  Location: [facility];  Service: Gastroenterology;  Laterality: N/A;   ESOPHAGOGASTRODUODENOSCOPY N/A 04/10/2021   Procedure: ESOPHAGOGASTRODUODENOSCOPY (EGD);  Surgeon: Wyline Mood, MD;  Location: [facility];  Service: Gastroenterology;  Laterality: N/A;   ESOPHAGOGASTRODUODENOSCOPY (EGD) WITH PROPOFOL N/A 06/10/2017   Procedure: ESOPHAGOGASTRODUODENOSCOPY (EGD) WITH PROPOFOL;  Surgeon: Wyline Mood, MD;  Location: [facility];  Service: Gastroenterology;  Laterality: N/A;   ESOPHAGOGASTRODUODENOSCOPY (EGD) WITH PROPOFOL N/A 01/01/2022   Procedure: ESOPHAGOGASTRODUODENOSCOPY (EGD) WITH PROPOFOL;  Surgeon: Wyline Mood, MD;  Location: [facility];  Service: Gastroenterology;  Laterality: N/A;   ESOPHAGOGASTRODUODENOSCOPY (EGD) WITH PROPOFOL N/A 01/06/2022   Procedure: ESOPHAGOGASTRODUODENOSCOPY (EGD) WITH PROPOFOL;  Surgeon: Wyline Mood, MD;  Location: [facility] END[facility];  Service: Gastroenterology;  Laterality: N/A;   HAMMER TOE SURGERY Right 11/21/2020   Procedure: HAMMERTOE CORRECTION;  Surgeon: Gwyneth Revels, DPM;  Location: [facility];  Service: Podiatry;  Laterality: Right;   INSERTION OF MESH  04/16/2021   Procedure: INSERTION OF MESH;  Surgeon: Henrene Dodge, MD;  Location: [facility];  Service: General;;   KNEE ARTHROSCOPY WITH MEDIAL MENISECTOMY Right  09/16/2012   Procedure: RIGHT KNEE ARTHROSCOPY WITH MEDIAL AND LATERAL MENISECTOMY, CHONDROPLASTY;  Surgeon: Loreta Ave, MD;  Location: [facility];  Service: Orthopedics;  Laterality: Right;  RIGHT KNEE SCOPE MEDIAL MENISCECTOMY   LEFT HEART CATH AND CORONARY ANGIOGRAPHY N/A 06/11/2016   Procedure: Left Heart Cath and Coronary Angiography;  Surgeon: Antonieta Iba, MD;  Location: [facility] INV[facility];  Service: Cardiovascular;  Laterality: N/A;   UMBILICAL HERNIA REPAIR N/A 04/16/2021   Procedure: HERNIA REPAIR UMBILICAL ADULT, open;  Surgeon: Henrene Dodge, MD;  Location: [facility];  Service: General;  Laterality: N/A;     Social History:   reports that he has never smoked. He has been exposed to tobacco smoke. He quit smokeless tobacco use about 38 years ago.  His smokeless tobacco use included chew. He reports that he does not currently use alcohol. He reports that he does not use drugs.   Family History:  His family history includes Breast cancer in his mother; Heart attack in his father; Heart disease (age of onset: 4) in his father; Hypertension in his mother; Stomach cancer in his father.   Allergies Allergies  Allergen Reactions   Statins Hives and Rash     Home Medications  Prior to Admission medications   Medication Sig Start Date End Date Taking? Authorizing Provider  aspirin EC 81 MG tablet Take 1 tablet (81 mg total) by mouth daily. Swallow whole. 11/12/22 11/12/23  Lurene Shadow, MD  citalopram (CELEXA) 40 MG tablet TAKE 1 TABLET BY MOUTH EVERY DAY 04/13/23   McDonough, Salomon Fick, PA-C  cyclobenzaprine (FLEXERIL) 10 MG tablet Take 1 tablet (10 mg total) by mouth at bedtime. Take one tab po qhs for back spasm prn only 04/13/23   McDonough, Lauren K, PA-C  ezetimibe (ZETIA) 10 MG tablet Take 1 tablet (10 mg total) by mouth daily. 11/13/22   Lurene Shadow, MD  furosemide (LASIX) 20 MG tablet TAKE 1 TABLET BY MOUTH EVERY DAY AS NEEDED FOR LEG SWELLING 07/21/22    McDonough, Salomon Fick, PA-C  gabapentin (NEURONTIN) 300 MG capsule Take 1 capsule (300 mg total) by mouth 2 (two) times daily. 11/12/22   Lurene Shadow, MD  lisinopril-hydrochlorothiazide (ZESTORETIC) 20-12.5 MG tablet Take 2 tablets by mouth daily.    [provider]  Multiple Vitamins-Minerals (MULTIVITAMIN ADULTS PO) Take 1 tablet by mouth daily.    [provider]  nitroGLYCERIN (NITROSTAT) 0.4 MG SL tablet Place 1 tablet (0.4 mg total) under the tongue every 5 (five) minutes x 3 doses as needed for chest pain. 04/25/21   Marrion Coy, MD  Semaglutide,0.25 or 0.5MG /DOS, 2 MG/3ML SOPN Inject 0.25 mg into the skin once a week. 04/13/23   Carlean Jews, PA-C     Care time: 38 minutes     Harlon Ditty, AGACNP-BC [facility] Prefer epic messenger for cross cover needs If after hours, please call E-link

## 2023-04-17 NOTE — Progress Notes (Signed)
 PT Cancellation Note  Patient Details Name: Gerald [name] MRN: [id number] DOB: 1965-07-08   Cancelled Treatment:    Reason Eval/Treat Not Completed: Active bedrest order (Patient on bed rest following TNK at 16:51 on 04/16/23. PT to follow up as appropriate.)  Donna Bernard, PT, MPT  Ina Homes 04/17/2023, 7:50 AM

## 2023-04-17 NOTE — Plan of Care (Signed)
  Problem: Education: Goal: Knowledge of disease or condition will improve Outcome: Progressing   Problem: Ischemic Stroke/TIA Tissue Perfusion: Goal: Complications of ischemic stroke/TIA will be minimized Outcome: Progressing   Problem: Health Behavior/Discharge Planning: Goal: Ability to manage health-related needs will improve Outcome: Progressing   Problem: Clinical Measurements: Goal: Diagnostic test results will improve Outcome: Progressing   Problem: Safety: Goal: Ability to remain free from injury will improve Outcome: Progressing

## 2023-04-17 NOTE — Plan of Care (Signed)
  Problem: Education: Goal: Knowledge of disease or condition will improve Outcome: Progressing Goal: Knowledge of secondary prevention will improve (MUST DOCUMENT ALL) Outcome: Progressing Goal: Knowledge of patient specific risk factors will improve (DELETE if not current risk factor) Outcome: Progressing   Problem: Ischemic Stroke/TIA Tissue Perfusion: Goal: Complications of ischemic stroke/TIA will be minimized Outcome: Progressing   Problem: Coping: Goal: Will verbalize positive feelings about self Outcome: Progressing Goal: Will identify appropriate support needs Outcome: Progressing   Problem: Health Behavior/Discharge Planning: Goal: Ability to manage health-related needs will improve Outcome: Progressing Goal: Goals will be collaboratively established with patient/family Outcome: Progressing   Problem: Self-Care: Goal: Ability to participate in self-care as condition permits will improve Outcome: Progressing Goal: Verbalization of feelings and concerns over difficulty with self-care will improve Outcome: Progressing Goal: Ability to communicate needs accurately will improve Outcome: Progressing   Problem: Nutrition: Goal: Risk of aspiration will decrease Outcome: Progressing Goal: Dietary intake will improve Outcome: Progressing   Problem: Education: Goal: Knowledge of General Education information will improve Description: Including pain rating scale, medication(s)/side effects and non-pharmacologic comfort measures Outcome: Progressing   Problem: Health Behavior/Discharge Planning: Goal: Ability to manage health-related needs will improve Outcome: Progressing   Problem: Clinical Measurements: Goal: Ability to maintain clinical measurements within normal limits will improve Outcome: Progressing Goal: Will remain free from infection Outcome: Progressing Goal: Diagnostic test results will improve Outcome: Progressing Goal: Respiratory complications will  improve Outcome: Progressing Goal: Cardiovascular complication will be avoided Outcome: Progressing   Problem: Activity: Goal: Risk for activity intolerance will decrease Outcome: Progressing   Problem: Nutrition: Goal: Adequate nutrition will be maintained Outcome: Progressing   Problem: Coping: Goal: Level of anxiety will decrease Outcome: Progressing   Problem: Elimination: Goal: Will not experience complications related to urinary retention Outcome: Progressing   Problem: Pain Managment: Goal: General experience of comfort will improve and/or be controlled Outcome: Progressing   Problem: Safety: Goal: Ability to remain free from injury will improve Outcome: Progressing   Problem: Skin Integrity: Goal: Risk for impaired skin integrity will decrease Outcome: Progressing   Problem: Education: Goal: Ability to describe self-care measures that may prevent or decrease complications (Diabetes Survival Skills Education) will improve Outcome: Progressing   Problem: Coping: Goal: Ability to adjust to condition or change in health will improve Outcome: Progressing   Problem: Fluid Volume: Goal: Ability to maintain a balanced intake and output will improve Outcome: Progressing   Problem: Health Behavior/Discharge Planning: Goal: Ability to identify and utilize available resources and services will improve Outcome: Progressing Goal: Ability to manage health-related needs will improve Outcome: Progressing   Problem: Tissue Perfusion: Goal: Adequacy of tissue perfusion will improve Outcome: Progressing

## 2023-04-17 NOTE — Progress Notes (Signed)
 OT Cancellation Note  Patient Details Name: Gerald [name] MRN: [id number] DOB: November 19, 1965   Cancelled Treatment:    Reason Eval/Treat Not Completed: Medical issues which prohibited therapy;Active bedrest order;Other (comment) (patient on bed rest following TNK at 16:51 on 04/16/23. OT will follow up as appropriate.)  Oleta Mouse, OTD OTR/L  04/17/23, 8:25 AM

## 2023-04-17 NOTE — Progress Notes (Addendum)
 PHARMACY CONSULT NOTE - FOLLOW UP  Pharmacy Consult for Electrolyte Monitoring and Replacement   Recent Labs: Potassium (mmol/L)  Date Value  04/17/2023 3.2 (L)  05/22/2014 3.9   Magnesium (mg/dL)  Date Value  96/05/5407 2.3  05/23/2013 2.0   Calcium (mg/dL)  Date Value  81/19/1478 9.2   Calcium, Total (mg/dL)  Date Value  29/56/2130 9.1   Albumin (g/dL)  Date Value  86/57/8469 3.8  09/08/2022 4.0  12/12/2013 3.6   Phosphorus (mg/dL)  Date Value  62/95/2841 3.2   Sodium (mmol/L)  Date Value  04/17/2023 135  09/08/2022 137  05/22/2014 137   Assessment: RE si a 58 yo male who presented to [facility] 3/13 with left sided-weakness and numbness concerning for Acute Ischemic CVA. TNK was given. Pharmacy was consulted to monitor electrolytes while patient is in the ICU.   Fluids: NS @ 50mL/hr  Goal of Therapy:  Electrolytes WNL  Plan:  Provider ordered 10 mEq Kcl IV x 6 No other replacement indicated at this time Continue to follow electrolytes with AM labs  Thank you for allowing pharmacy to participate in this patient's care.   Effie Shy, PharmD Pharmacy Resident  04/17/2023 7:14 AM

## 2023-04-17 NOTE — Evaluation (Signed)
 Speech Language Pathology Evaluation Patient Details Name: Gerald [name] MRN: [id number] DOB: Jun 07, 1965 Today's Date: 04/17/2023 Time: 2952-8413 SLP Time Calculation (min) (ACUTE ONLY): 45 min  Problem List:  Patient Active Problem List   Diagnosis Date Noted   CVA (cerebral vascular accident) (HCC) 04/16/2023   Acute CVA (cerebrovascular accident) (HCC) 11/10/2022   OSA (obstructive sleep apnea) 08/18/2022   CPAP use counseling 08/18/2022   Essential hypertension 08/18/2022   Insufficient sleep syndrome 08/18/2022   Melena 01/06/2022   Postoperative abdominal pain 04/24/2021   Mesenteric panniculitis (HCC) 04/24/2021   Orthopnea    Infection of superficial incisional surgical site after procedure    Non-recurrent bilateral inguinal hernia without obstruction or gangrene    Umbilical hernia without obstruction and without gangrene    Impingement syndrome of right shoulder region 11/07/2019   Chest pain 01/08/2018   Gastroesophageal reflux disease without esophagitis 08/23/2017   Urinary tract infection without hematuria 06/14/2017   Generalized anxiety disorder 02/02/2017   Abnormal results of liver function studies 02/02/2017   Circadian rhythm sleep disorder, shift work type 02/02/2017   Acne vulgaris 02/02/2017   Occlusion and stenosis of bilateral carotid arteries 02/02/2017   Neoplasm of uncertain behavior of skin 02/02/2017   Morbid obesity (HCC) 07/07/2016   Atypical chest pain 06/08/2016   Mass of jaw 05/22/2014   Retaining fluid 11/08/2013   Urinary incontinence 03/14/2013   Depression 01/13/2013   Type 2 diabetes mellitus (HCC) 09/14/2012   Nonischemic cardiomyopathy (HCC)    Mixed hyperlipidemia 05/28/2012   Chronic pain syndrome 03/30/2012   HTN (hypertension) 03/30/2012   Past Medical History:  Past Medical History:  Diagnosis Date   Abnormal LFTs    Anxiety    Atypical chest pain    Borderline diabetes    CAD (coronary artery disease)    a.)  PCI and stent placement (unknown type) to mLAD and mLCx; date unknown. b.) LHC 09/10/2010: 40% mLAD; no intervention. c.) LHC 09/12/2011: EF 60%; 30% ISR mLAD, 40% dLAD, 30% pLCx, 30% ISR mLCx, 40% mRCA; no intervention. d.) LHC 05/23/2014: EF >55%; 30% p-mLAD; no intervention. e.) LHC 06/11/2016: EF 55-65%; LVEDP norm; 30-40% p-mLAD; no intervention.   Carcinoma (HCC)    Chronic pain syndrome    Depression    Fundic gland polyps of stomach, benign    Gastritis    GERD (gastroesophageal reflux disease)    Hepatic steatosis    Hyperlipidemia    Hypertension    Migraines    Myocardial infarction [facility]) 2008   was told by PCP   Nonischemic cardiomyopathy (HCC)    a.) TTE 09/12/2011: EF 35-45%. b.) LHC 09/12/2011: EF 60%. c.) TTE 06/04/2012: EF 55-60%. d.) LHC 05/23/2014: EF >55%. e.) TTE 06/11/2016: EF 55-60%; G1DD. f.) LHC 06/11/2016: EF 55-65%. g.) TTE 01/08/2018: EF 60-65%.   Obesity    Occlusion and stenosis of bilateral carotid arteries    OSA on CPAP    Osteoarthritis of both knees    Shortness of breath    Squamous cell carcinoma of skin 02/27/2020   left distal lat deltoid - EDC   Past Surgical History:  Past Surgical History:  Procedure Laterality Date   BONE EXCISION Right 11/21/2020   Procedure: PART EXCISION BONE- PHALANX;  Surgeon: Gwyneth Revels, DPM;  Location: [facility];  Service: Podiatry;  Laterality: Right;   CARDIAC CATHETERIZATION N/A 09/2011   [facility]; EF 50% with 30% mid LAD stenosis and no obstructive disease.   CARDIAC CATHETERIZATION  09/2010  [facility]; Mid LAD 40% stenosis; Mid Circumflex:Normal; Mid RCA; Normal   CARDIAC CATHETERIZATION     COLONOSCOPY     COLONOSCOPY WITH PROPOFOL N/A 04/10/2021   Procedure: COLONOSCOPY WITH PROPOFOL;  Surgeon: Wyline Mood, MD;  Location: [facility] ENDOSCOPY;  Service: Gastroenterology;  Laterality: N/A;   ESOPHAGOGASTRODUODENOSCOPY N/A 04/10/2021   Procedure: ESOPHAGOGASTRODUODENOSCOPY (EGD);  Surgeon: Wyline Mood, MD;   Location: [facility] ENDOSCOPY;  Service: Gastroenterology;  Laterality: N/A;   ESOPHAGOGASTRODUODENOSCOPY (EGD) WITH PROPOFOL N/A 06/10/2017   Procedure: ESOPHAGOGASTRODUODENOSCOPY (EGD) WITH PROPOFOL;  Surgeon: Wyline Mood, MD;  Location: [facility] END[facility]COPY;  Service: Gastroenterology;  Laterality: N/A;   ESOPHAGOGASTRODUODENOSCOPY (EGD) WITH PROPOFOL N/A 01/01/2022   Procedure: ESOPHAGOGASTRODUODENOSCOPY (EGD) WITH PROPOFOL;  Surgeon: Wyline Mood, MD;  Location: [facility] ENDOSCOPY;  Service: Gastroenterology;  Laterality: N/A;   ESOPHAGOGASTRODUODENOSCOPY (EGD) WITH PROPOFOL N/A 01/06/2022   Procedure: ESOPHAGOGASTRODUODENOSCOPY (EGD) WITH PROPOFOL;  Surgeon: Wyline Mood, MD;  Location: [facility] ENDOSCOPY;  Service: Gastroenterology;  Laterality: N/A;   HAMMER TOE SURGERY Right 11/21/2020   Procedure: HAMMERTOE CORRECTION;  Surgeon: Gwyneth Revels, DPM;  Location: [facility];  Service: Podiatry;  Laterality: Right;   INSERTION OF MESH  04/16/2021   Procedure: INSERTION OF MESH;  Surgeon: Henrene Dodge, MD;  Location: [facility] ORS;  Service: General;;   KNEE ARTHROSCOPY WITH MEDIAL MENISECTOMY Right 09/16/2012   Procedure: RIGHT KNEE ARTHROSCOPY WITH MEDIAL AND LATERAL MENISECTOMY, CHONDROPLASTY;  Surgeon: Loreta Ave, MD;  Location: [facility];  Service: Orthopedics;  Laterality: Right;  RIGHT KNEE SCOPE MEDIAL MENISCECTOMY   LEFT HEART CATH AND CORONARY ANGIOGRAPHY N/A 06/11/2016   Procedure: Left Heart Cath and Coronary Angiography;  Surgeon: Antonieta Iba, MD;  Location: [facility] INVASIVE CV LAB;  Service: Cardiovascular;  Laterality: N/A;   UMBILICAL HERNIA REPAIR N/A 04/16/2021   Procedure: HERNIA REPAIR UMBILICAL ADULT, open;  Surgeon: Henrene Dodge, MD;  Location: [facility] ORS;  Service: General;  Laterality: N/A;   HPI:  Pt is a 58 y.o. male s/p TNK currently for acute onset left-sided weakness concerning for recurrent stroke versus recrudescence of prior stroke versus anxiety/pain related.   Pt describe experiencing cramping and spasms on the left side, which he has experienced intermittently since the stroke. Working in a Insurance underwriter exacerbates the leg pain, making it difficult to sleep. The pain is described as a chronic sensation  Pt has c/o frequent headaches potentially due to medication overuse, with regular use of acetaminophen and ibuprofen.  Per Neurology note, discussed the concept of medication overuse headache and the need to reduce analgesic use.  PMH: includes stroke 11/2022, anxiety/depression, Obesity, GERD, gastritis, Chronic pain syndrome, CAD, Nonischemic cardiomyopathy, OSA on CPAP, Occlusion and stenosis of bilateral carotid arteries, Migraines, OA.  Pt stated he was NOT able to obtain PT post his last stroke in 11/2022.   Assessment / Plan / Recommendation Clinical Impression   Pt seen today for informal cognitive-linguistic assessment at bedside this morning. NSG completed the bedside swallow screen(pt passed), and an oral diet was ordered by MD -- pt was finishing some of the meal.  Upon entering the room, pt awake, verbally engaged in conversation w/ this SLP. Pt sat EOB following instructions and managing self-care fairly independently(in a hospital bed, cords, etc). Pt stated he felt no changes in his speech or swallowing.   On RA, afebrile. WBC 7.7. NSG has not reported any speech-communication deficits this morning. Pt reported A/O x4 per chart.    Pt appears to present w/ functional  cognitive-linguistic communication skills w/ no overt deficits noted w/ informal assessment. No dysarthria nor dysfluency at the conversation level noted. Pt has engaged in both casual and complex conversation since admit; no aphasia noted per Neurology note.    During the assessment, pt was verbal and engaged easily; no pragmatic deficits. Speech intelligibility was 100% during conversation. No overt receptive nor expressive language deficits noted; no  aphasia. Tasks including picture description, following commands, more complex Y/N questions, few calculations, naming, and completing descriptive tasks re: his job, schedule were all WFL. No overt aphasia noted. No overt Cognitive deficits noted as well; A/O x4, few calculations, and functional memory questions were all WFL. OM exam was grossly [facility] w/ possible slight R lingual deviation at times(?). Pt denied any drooling or loss of liquid on right side of mouth, no difficulty chewing or moving food/liquid around in mouth. No overt swallowing deficits noted during sips of thin liquids via straw during session. NSG reported none.    Discussed results of this informal cognitive-linguistic assessment and the recommendations; no immediate f/u recommended at Discharge, but post returning to home environment, if pt felt any deficits in performing cognitive-linguistic and/or communication tasks in the home or work environment, he should f/u w/ PCP for referral to outpatient services. Pt agreed stating he felt that his communication skills were [facility]. Encouraged rest and nutrition/hydration now, and upon returning home. NSG to reconsult if any decline in status while admitted. NSG/MD updated.    SLP Assessment  SLP Recommendation/Assessment: Patient does not need any further Speech Lanaguage Pathology Services (if ANY issues post d/c home noted, then f/u w/ PCP is rec'd) SLP Visit Diagnosis: Cognitive communication deficit (R41.841)    Recommendations for follow up therapy are one component of a multi-disciplinary discharge planning process, led by the attending physician.  Recommendations may be updated based on patient status, additional functional criteria and insurance authorization.    Follow Up Recommendations  No SLP follow up (indicated at this time)    Assistance Recommended at Discharge  Set up Supervision/Assistance (as needed)  Functional Status Assessment Patient has not had a recent decline in  their functional status  Frequency and Duration  (n/a)   (n/a)      SLP Evaluation Cognition  Overall Cognitive Status: Within Functional Limits for tasks assessed Arousal/Alertness: Awake/alert Orientation Level: Oriented X4 Year: 2025 Month: March Attention: Focused;Sustained Focused Attention: Appears intact Sustained Attention: Appears intact Memory: Appears intact Awareness: Appears intact Problem Solving: Appears intact Executive Function: Reasoning;Sequencing Reasoning: Appears intact Sequencing: Appears intact Behaviors:  (n/a) Safety/Judgment: Appears intact Comments: aware that he needed to use the call bell to go to the bathroom       Comprehension  Auditory Comprehension Overall Auditory Comprehension: Appears within functional limits for tasks assessed Yes/No Questions: Within Functional Limits Commands: Within Functional Limits Conversation: Complex Interfering Components:  (n/a) Visual Recognition/Discrimination Discrimination: Not tested Reading Comprehension Reading Status: Not tested    Expression Expression Primary Mode of Expression: Verbal Verbal Expression Overall Verbal Expression: Appears within functional limits for tasks assessed Initiation: No impairment Automatic Speech:  (wfl) Level of Generative/Spontaneous Verbalization: Conversation Repetition: No impairment Naming: No impairment Pragmatics: No impairment Non-Verbal Means of Communication: Not applicable Written Expression Written Expression: Not tested   Oral / Motor  Oral Motor/Sensory Function Overall Oral Motor/Sensory Function: Within functional limits (grossly -- maybe a slight R lingual deviation?) Motor Speech Overall Motor Speech: Appears within functional limits for tasks assessed (grossly -- slightly slower rate of  speech?) Respiration: Within functional limits Phonation: Normal Resonance: Within functional limits Articulation: Within functional  limitis Intelligibility: Intelligible              Jerilynn Som, MS, CCC-SLP Speech Language Pathologist Rehab Services; [facility] [phone] (ascom) Ewelina Naves 04/17/2023, 3:03 PM

## 2023-04-17 NOTE — Progress Notes (Signed)
 NEUROLOGY CONSULT FOLLOW UP NOTE   Date of service: April 17, 2023 Patient Name: Gerald [name] MRN:  [id number] DOB:  March 16, 1965   The patient is a 58 year old right-handed man with a history of stroke who presents with left-sided s/p TNK   They describe experiencing cramping and spasms on the left side, which they have experiencing intermittently since the stroke. 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 The pain is described as a chronic sensation, likened to a stretched rubber band' that won't return to its original state.  They have been taking Tylenol for frequent headaches and using it regularly for pain relief. There is concern about medication overuse headache due to regular use of Tylenol and ibuprofen.  There is a history of a rash from statin medication, which broke out all over their body, particularly on their back. They cannot recall the specific statin but mention atorvastatin and a non-statin medication that was not covered by insurance. No muscle weakness was associated with the rash.  They have started a new job and are concerned about returning to work by Monday. They have insurance through their new job, which is important for managing their health conditions.  They do not smoke and have been using a CPAP machine for sleep apnea.   Per Dr. Pearlean Brownie tele eveal on 3/13 sudden onset of left-sided weakness and numbness today and 1530  LKW: 1530 tnk given?: yes IR Thrombectomy? No,  as no LVO Modified Rankin Scale: 1-No significant post stroke disability and can perform usual duties with stroke symptoms Time of teleneurologist evaluation: 1615    Interval Hx/subjective   -Feeling back to baseline  Vitals   Vitals:   04/17/23 0756 04/17/23 0800 04/17/23 0900 04/17/23 1000  BP:  139/76 131/60 (!) 145/91  Pulse:  70 70 73  Resp:  19 12 18   Temp: 97.9 F (36.6 C)     TempSrc: Oral      SpO2:  93% 94% 97%  Weight:      Height:         Body mass index is 40.08 kg/m.  Physical Exam   Constitutional: Appears well-developed and well-nourished.  Thick neck Psych: Affect appropriate to situation.  Eyes: No scleral injection.  HENT: No OP obstrucion.  Head: Normocephalic.  Cardiovascular: Normal rate and regular rhythm.  Respiratory: Effort normal, non-labored breathing.  GI: Soft.  No distension. There is no tenderness.  Skin: WDI.   Neurologic Examination    Neuro: Mental Status: Patient is awake, alert, oriented to person, place, month, age, and situation. Patient is able to give a clear and coherent history.  Able to teach back most of our discussion without full drug name details etc. No signs of aphasia or neglect Cranial Nerves: II: Visual Fields are full. Pupils are equal, round, and reactive to light.   III,IV, VI: EOMI.  Mild left eye ptosis V: Facial sensation is symmetric to light touch  VII: Facial movement is symmetric.  VIII: hearing is intact to voice X: Uvula elevates symmetrically XI: Shoulder shrug is symmetric. XII: tongue is slightly deviated to the right Motor: 5/5 strength was present in all four extremities.  Sensory: Sensation reduced in the left arm and left leg Deep Tendon Reflexes: 3+ left patellar, 2+ right patellar Cerebellar: FNF and HKS are intact bilaterally    Medications  Current Facility-Administered Medications:    acetaminophen (TYLENOL) tablet 650 mg, 650 mg, Oral,  Q4H PRN **OR** acetaminophen (TYLENOL) 160 MG/5ML solution 650 mg, 650 mg, Per Tube, Q4H PRN **OR** acetaminophen (TYLENOL) suppository 650 mg, 650 mg, Rectal, Q4H PRN, Judithe Modest, NP   Chlorhexidine Gluconate Cloth 2 % PADS 6 each, 6 each, Topical, Daily, Assaker, Jean-Pierre, MD, 6 each at 04/17/23 0957   [DISCONTINUED] labetalol (NORMODYNE) injection 20 mg, 20 mg, Intravenous, Once **AND** clevidipine (CLEVIPREX) infusion 0.5 mg/mL, 0-21  mg/hr, Intravenous, Continuous, Harlon Ditty D, NP   insulin aspart (novoLOG) injection 0-15 Units, 0-15 Units, Subcutaneous, TID WC, Harlon Ditty D, NP, 3 Units at 04/17/23 0752   insulin aspart (novoLOG) injection 0-5 Units, 0-5 Units, Subcutaneous, QHS, Harlon Ditty D, NP   pantoprazole (PROTONIX) injection 40 mg, 40 mg, Intravenous, QHS, Harlon Ditty D, NP, 40 mg at 04/16/23 2239   potassium chloride 10 mEq in 100 mL IVPB, 10 mEq, Intravenous, Q1 Hr x 6, Rust-Chester, Britton L, NP, Last Rate: 100 mL/hr at 04/17/23 0716, Infusion Verify at 04/17/23 0716   rosuvastatin (CRESTOR) tablet 5 mg, 5 mg, Oral, Daily, Kanchan Gal L, MD   senna-docusate (Senokot-S) tablet 1 tablet, 1 tablet, Oral, QHS PRN, Judithe Modest, NP  Labs and Diagnostic Imaging   CBC:  Recent Labs  Lab 04/16/23 1632 04/17/23 0403  WBC 8.4 7.7  NEUTROABS 4.2  --   HGB 14.9 14.6  HCT 41.9 41.7  MCV 85.7 84.4  PLT 255 243    Basic Metabolic Panel:  Lab Results  Component Value Date   NA 135 04/17/2023   K 3.2 (L) 04/17/2023   CO2 23 04/17/2023   GLUCOSE 141 (H) 04/17/2023   BUN 31 (H) 04/17/2023   CREATININE 0.73 04/17/2023   CALCIUM 9.2 04/17/2023   GFRNONAA >60 04/17/2023   GFRAA 114 08/02/2019   Lipid Panel:  Lab Results  Component Value Date   LDLCALC 182 (H) 04/17/2023   HgbA1c:  Lab Results  Component Value Date   HGBA1C 9.1 (A) 04/13/2023   Urine Drug Screen:     Component Value Date/Time   LABOPIA NONE DETECTED 04/16/2023 1831   LABOPIA NONE DETECTED 07/17/2017 1839   COCAINSCRNUR NONE DETECTED 04/16/2023 1831   LABBENZ NONE DETECTED 04/16/2023 1831   LABBENZ NONE DETECTED 07/17/2017 1839   AMPHETMU NONE DETECTED 04/16/2023 1831   AMPHETMU NONE DETECTED 07/17/2017 1839   THCU NONE DETECTED 04/16/2023 1831   THCU NONE DETECTED 07/17/2017 1839   LABBARB NONE DETECTED 04/16/2023 1831   LABBARB (A) 07/17/2017 1839    Result not available. Reagent lot number recalled  by manufacturer.    Alcohol Level     Component Value Date/Time   ETH <10 04/16/2023 1632   INR  Lab Results  Component Value Date   INR 1.1 04/16/2023   APTT  Lab Results  Component Value Date   APTT 30 04/16/2023   AED levels: No results found for: "PHENYTOIN", "ZONISAMIDE", "LAMOTRIGINE", "LEVETIRACETA"  CT Head without contrast(Personally reviewed): No acute intracranial process  CT angio Head and Neck with contrast(Personally reviewed): 1. No large vessel occlusion. 2. Similar moderate stenosis of the left PCA origin and right PCA anterior P2 segment. 3. No significant stenosis of the cervical carotid or vertebral arteries. 4. Mild atherosclerosis of the right cavernous ICA without significant stenosis.   MRI Brain(to be personally reviewed): Pending  Echocardiogram:  Pending  Assessment   Gerald [name] is a 58 y.o. male s/p TNK for acute onset left-sided weakness concerning for recurrent stroke versus recrudescence of prior  stroke versus anxiety/pain related  Recommendations    #Acute left-sided weakness, potentially related to recrudescence of previous stroke versus recurrent stroke, s/p TNK - A1c not meeting goal at 9.1%, cholesterol not meeting goal with LDL at 182; expect improvement with reinitiation of medications now that patient has insurance - Neurochecks per post TNK protocol - Order MRI to assess for any bleeding complications post-TNK administration - Evaluate for aspirin and Plavix initiation post-MRI results - Blood pressure goal post tPA for 24  hours < 180/105 - PT/OT evaluation pending but anticipate no needs given patient appears at baseline on my examination - Echocardiogram pending  #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 Pain exacerbated by work conditions and described as cramping and involuntary  movements. Lamotrigine is proposed for pain management, with a slow titration to minimize rash risk. - Initiate lamotrigine with gradual dose escalation to manage pain - Educated on potential for rash with lamotrigine and importance of slow titration   Morning  Night  Week 1 and 2   none   25 mg   Week 3 and 4   25 mg    25 mg  Week 5   25 mg   50 mg  Week 6   50 mg   75 mg  Week 7   75 mg 100 mg  Week 8  100 mg 125 mg   #Medication overuse headache Frequent headaches potentially due to medication overuse, with regular use of acetaminophen and ibuprofen. Discussed the concept of medication overuse headache and the need to reduce analgesic use. CGRP medications like Nurtec were suggested as alternatives. - Educate on medication overuse headache and the need to limit analgesic use - Consider CGRP medications like Nurtec for headache management on an outpatient basis  #Hyperlipidemia Hyperlipidemia with previous adverse reaction to statins, including a rash. Discussed the potential for trying a low dose of rosuvastatin or atorvastatin, with monitoring for adverse reactions. Emphasized the importance of documenting any adverse reactions for insurance purposes to potentially justify non-statin alternatives. - Start rosuvastatin 5 mg daily now - Monitor for adverse reactions, particularly rash  #Dietary management Current carnivore diet may not be optimal for heart and brain health. Discussed the benefits of the Mediterranean diet for reducing cardiovascular and cerebrovascular risk, emphasizing fish, olive oil, and vegetables. - Provide educational materials on the Mediterranean diet  #Follow-up Plan for discharge contingent on MRI results and symptom improvement. Expressed desire to return home and resume work. Discharge is anticipated early tomorrow if MRI results are favorable and physical therapy evaluation is satisfactory. - Plan for discharge early tomorrow if MRI results are favorable -  Assess readiness to return to work based on physical therapy evaluation and symptom improvement ______________________________________________________________________  Brooke Dare MD-PhD [facility] [phone]   CRITICAL CARE Performed by: Gordy Councilman   Total critical care time: 45 minutes  Critical care time was exclusive of separately billable procedures and treating other patients.  Critical care was necessary to treat or prevent imminent or life-threatening deterioration.  Critical care was time spent personally by me on the following activities: development of treatment plan with patient and/or surrogate as well as nursing, discussions with consultants, evaluation of patient's response to treatment, examination of patient, obtaining history from patient or surrogate, ordering and performing treatments and interventions, ordering and review of laboratory studies, ordering and review of radiographic studies,  pulse oximetry and re-evaluation of patient's condition.

## 2023-04-18 ENCOUNTER — Inpatient Hospital Stay
Admit: 2023-04-18 | Discharge: 2023-04-18 | Disposition: A | Discharge status: Home / Self Care | Attending: Pulmonary Disease

## 2023-04-18 DIAGNOSIS — G4733 Obstructive sleep apnea (adult) (pediatric): Secondary | ICD-10-CM | POA: Diagnosis not present

## 2023-04-18 DIAGNOSIS — R531 Weakness: Secondary | ICD-10-CM | POA: Diagnosis not present

## 2023-04-18 DIAGNOSIS — I69398 Other sequelae of cerebral infarction: Secondary | ICD-10-CM

## 2023-04-18 DIAGNOSIS — R200 Anesthesia of skin: Secondary | ICD-10-CM | POA: Diagnosis not present

## 2023-04-18 DIAGNOSIS — Z91148 Patient's other noncompliance with medication regimen for other reason: Secondary | ICD-10-CM | POA: Diagnosis not present

## 2023-04-18 LAB — ECHOCARDIOGRAM COMPLETE
Height: 74 in
Weight: 4994.74 [oz_av]

## 2023-04-18 LAB — CBC
HCT: 40.7 % (ref 39.0–52.0)
Hemoglobin: 14.2 g/dL (ref 13.0–17.0)
MCH: 29.5 pg (ref 26.0–34.0)
MCHC: 34.9 g/dL (ref 30.0–36.0)
MCV: 84.6 fL (ref 80.0–100.0)
Platelets: 221 10*3/uL (ref 150–400)
RBC: 4.81 MIL/uL (ref 4.22–5.81)
RDW: 13.3 % (ref 11.5–15.5)
WBC: 7.6 10*3/uL (ref 4.0–10.5)
nRBC: 0 % (ref 0.0–0.2)

## 2023-04-18 LAB — RENAL FUNCTION PANEL
Albumin: 3.6 g/dL (ref 3.5–5.0)
Anion gap: 7 (ref 5–15)
BUN: 18 mg/dL (ref 6–20)
CO2: 26 mmol/L (ref 22–32)
Calcium: 9 mg/dL (ref 8.9–10.3)
Chloride: 100 mmol/L (ref 98–111)
Creatinine, Ser: 0.82 mg/dL (ref 0.61–1.24)
GFR, Estimated: >60 mL/min (ref >60)
Glucose, Bld: 191 mg/dL — ABNORMAL HIGH (ref 70–99)
Phosphorus: 3.1 mg/dL (ref 2.5–4.6)
Potassium: 3.3 mmol/L — ABNORMAL LOW (ref 3.5–5.1)
Sodium: 133 mmol/L — ABNORMAL LOW (ref 135–145)

## 2023-04-18 LAB — GLUCOSE, CAPILLARY: Glucose-Capillary: 172 mg/dL — ABNORMAL HIGH (ref 70–99)

## 2023-04-18 LAB — MAGNESIUM: Magnesium: 2 mg/dL (ref 1.7–2.4)

## 2023-04-18 LAB — PHOSPHORUS: Phosphorus: 3.1 mg/dL (ref 2.5–4.6)

## 2023-04-18 MED ORDER — ROSUVASTATIN CALCIUM 10 MG PO TABS: 5.0000 mg | ORAL_TABLET | Freq: Every day | ORAL | Status: DC

## 2023-04-18 MED ORDER — CLOPIDOGREL BISULFATE 75 MG PO TABS: 75.0000 mg | ORAL_TABLET | Freq: Every day | ORAL | 3 refills | Status: AC

## 2023-04-18 MED ORDER — ASPIRIN 81 MG PO TBEC
81.0000 mg | DELAYED_RELEASE_TABLET | Freq: Every day | ORAL | Status: DC
  Administered 2023-04-18: 81 mg via ORAL
  Filled 2023-04-18: qty 1

## 2023-04-18 MED ORDER — ASPIRIN 81 MG PO TBEC: 81.0000 mg | DELAYED_RELEASE_TABLET | Freq: Every day | ORAL | 12 refills | Status: DC

## 2023-04-18 MED ORDER — ROSUVASTATIN CALCIUM 5 MG PO TABS: 5.0000 mg | ORAL_TABLET | Freq: Every day | ORAL | 3 refills | Status: AC

## 2023-04-18 MED ORDER — CLOPIDOGREL BISULFATE 75 MG PO TABS
300.0000 mg | ORAL_TABLET | Freq: Once | ORAL | Status: AC
  Administered 2023-04-18: 300 mg via ORAL
  Filled 2023-04-18: qty 4

## 2023-04-18 MED ORDER — LAMOTRIGINE 25 MG PO TABS: ORAL_TABLET | ORAL | 0 refills | Status: DC

## 2023-04-18 MED ORDER — POTASSIUM CHLORIDE CRYS ER 20 MEQ PO TBCR
40.0000 meq | EXTENDED_RELEASE_TABLET | Freq: Once | ORAL | Status: AC
  Administered 2023-04-18: 40 meq via ORAL
  Filled 2023-04-18: qty 2

## 2023-04-18 MED ORDER — CLOPIDOGREL BISULFATE 75 MG PO TABS: 75.0000 mg | ORAL_TABLET | Freq: Every day | ORAL | Status: DC

## 2023-04-18 NOTE — Progress Notes (Signed)
 Patient discharged to home with wife. Discharge education provided to patient and wife. Discharge paperwork sent with patient. Patient satisfied all belongings kept at bedside have been returned.

## 2023-04-18 NOTE — Progress Notes (Signed)
 PHARMACY CONSULT NOTE - FOLLOW UP  Pharmacy Consult for Electrolyte Monitoring and Replacement   Recent Labs: Potassium (mmol/L)  Date Value  04/18/2023 3.3 (L)  05/22/2014 3.9   Magnesium (mg/dL)  Date Value  46/96/2952 2.0  05/23/2013 2.0   Calcium (mg/dL)  Date Value  84/13/2440 9.0   Calcium, Total (mg/dL)  Date Value  11/30/2534 9.1   Albumin (g/dL)  Date Value  64/40/3474 3.6  09/08/2022 4.0  12/12/2013 3.6   Phosphorus (mg/dL)  Date Value  25/95/6387 3.1   Sodium (mmol/L)  Date Value  04/18/2023 133 (L)  09/08/2022 137  05/22/2014 137   Assessment: RE si a 58 yo male who presented to [facility] 3/13 with left sided-weakness and numbness concerning for Acute Ischemic CVA. TNK was given. Pharmacy was consulted to monitor electrolytes while patient is in the ICU.   Fluids: NS @ 72mL/hr  Goal of Therapy:  Electrolytes WNL  Plan:  Provider ordered 40 mEq Kcl PO x 1 No other replacement indicated at this time Continue to follow electrolytes with AM labs  Thank you for allowing pharmacy to participate in this patient's care.   Bettey Costa, PharmD Clinical Pharmacist 04/18/2023 7:27 AM

## 2023-04-18 NOTE — Discharge Instructions (Signed)
 Food Resources  Agency Name: [facility] Agency Address: [address], [place], [place] [address] Phone: [phone] Website: [email or web address] Service(s) Offered: Housing services, self-sufficiency, congregate meal program, weatherization program, Event organiser program, emergency food assistance,  housing counseling, home ownership program, wheels - to work program.  [employer] free for 60 and older at various locations from [employer], Monday-Friday:  [employer], [address] [place], [phone] -[facility], [address] [place] [phone]  -[facility], [address] [place] [phone]  -[address], [place], [phone]  Agency Name: [facility] Address: [phone] [address], [place], [place] [address] Phone: [phone] Website: [email or web address] Service(s) Offered: Home delivered hot, frozen, and emergency  meals. Grocery assistance program which matches  volunteers one-on-one with seniors unable to grocery shop  for themselves. Must be 60 years and older; less than 20  hours of in-home aide service, limited or no driving ability;  live alone or with someone with a disability; live in  [place].  Agency Name: [employer] [facility] [employer]) Address: [address], [place], [place] [address] Phone: [phone] Service(s) Offered: Food is served to shut-ins, homeless, elderly, and low income people in the community every Saturday (11:30 am-12:30 pm) and Sunday (12:30 pm-1:30pm). Volunteers also offer help and encouragement in seeking employment,  and spiritual guidance.  Agency Name: [employer] Address: [address] [place], [place] [address] Phone: [phone] Service(s) Offered: Child support services; child welfare services; food stamps; Medicaid; work first family assistance; and aid  with fuel,  rent, food and medicine.  Agency Name: [employer] Address: [address] [place], [place] Phone: [phone] Website: [email or web address] Services Offered: Monday 10:00am-12:00, 8:00pm-9:00pm, and Friday 10:00am-12:00.  Agency Name: [employer] Address: [address] [place], [place] [address] Phone: [phone] Website: [email or web address] Service(s) Offered: Serves weekday meals, open from 11:30 am- 1:00 pm., and 6:30-7:30pm, Monday-Wednesday-Friday distributes food 3:30-6pm, Monday-Wednesday-Friday.  Agency Name: [facility] Address: [address] [place], [place] Phone: [phone] Website: [email or web address] Services Offered: Distributes food the 4th Saturday of the month, starting at 8:00 am  Agency Name: [facility] Address: [phone] [address], [place], [place] [address] Phone: [phone] Website: [email or web address] Service(s) Offered: [employer] life, weekly food pantry. Open Wednesdays from 10:00am-noon.  Agency Name: [facility] [employer] Address: [address] [place], [place], [place] Phone: [phone] Services Offered: Distributes food 9am-1pm, Monday-Thursday. Call for details.  Agency Name: [facility] Address: [address], [place], [place] [address] Phone: [phone] Website: [email or web address] Service(s) Offered: Games developer. Call for assistance.  Agency Name: [facility] Address: [address] [place], [place] [address] Phone: [phone] Service Offered: Emergency Food Pantry. Call for appointment.  Agency Name: [facility] Address: [address] [place], [place] [address] Phone: [phone] Website: [email or web address] Services Offered: Games developer. Call for details  Agency Name: [facility] Address: [address] [place], [place] Phone:  [phone] Website: [email or web address] Service(s) Offered: Emergency Food Pantry. Call for details.  Agency Name: Holiday representative Address: [address], [place], [place] [address] Phone: [phone] or [phone] Website: [email or web address] Service(s) Offered: Distribute food 9am-11:30 am, Tuesday-Friday, and 1-3:30pm, Monday-Friday. Food pantry Monday-Friday 1pm-3pm, fresh items, Mon.-Wed.-Fri.  Agency Name: [facility] Address: [address] [place], [place] [address] Phone: [phone] Website: [email or web address] Services Offered: Distribute food Tues and Sats from 9:00am-noon.  Closed 1st Saturday of each month. Call for details  Agency Name: [employer] Address: [place] [facility] [address], [place] [address] Phone: [phone]  Services Offered: Delivers meals every Thursday

## 2023-04-18 NOTE — Plan of Care (Signed)

## 2023-04-18 NOTE — Progress Notes (Signed)
 NEUROLOGY CONSULT FOLLOW UP NOTE   Date of service: April 18, 2023 Patient Name: Gerald [name] MRN:  [id number] DOB:  April 24, 1965   The patient is a 58 year old right-handed man with a history of stroke who presents with left-sided s/p TNK   They describe experiencing cramping and spasms on the left side, which they have experiencing intermittently since the stroke. 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 The pain is described as a chronic sensation, likened to a stretched rubber band' that won't return to its original state.  They have been taking Tylenol for frequent headaches and using it regularly for pain relief. There is concern about medication overuse headache due to regular use of Tylenol and ibuprofen.  There is a history of a rash from statin medication, which broke out all over their body, particularly on their back. They cannot recall the specific statin but mention atorvastatin and a non-statin medication that was not covered by insurance. No muscle weakness was associated with the rash.  They have started a new job and are concerned about returning to work by Monday. They have insurance through their new job, which is important for managing their health conditions.  They do not smoke and have been using a CPAP machine for sleep apnea.   Per Dr. Pearlean Brownie tele eveal on 3/13 sudden onset of left-sided weakness and numbness today and 1530  LKW: 1530 tnk given?: yes IR Thrombectomy? No,  as no LVO Modified Rankin Scale: 1-No significant post stroke disability and can perform usual duties with stroke symptoms Time of teleneurologist evaluation: 1615    Interval Hx/subjective   - Xanax for MRI 1 mg at 4:30 PM yesterday - Tolerated rosuvastatin and lamotrigine well  - No new complaints, stable left sided sensory deficit from original stroke   Vitals   Vitals:   04/18/23 0300 04/18/23 0400  04/18/23 0500 04/18/23 0600  BP:  (!) 115/55    Pulse: 72 69 72 72  Resp: 20 14 14 12   Temp:  98.3 F (36.8 C)    TempSrc:  Oral    SpO2: 97% 92% 93% 95%  Weight:      Height:         Body mass index is 40.08 kg/m.  Physical Exam   Constitutional: Appears well-developed and well-nourished.  Thick neck Psych: Affect appropriate to situation.  Eyes: No scleral injection.  HENT: No OP obstrucion.  Head: Normocephalic.  Cardiovascular: Normal rate and regular rhythm.  Respiratory: Effort normal, non-labored breathing.  GI: Soft.  No distension. There is no tenderness.  Skin: WDI.   Neurologic Examination    Neuro: Mental Status: Patient is awake, alert, oriented to person, place, month, age, and situation. Patient is able to give a clear and coherent history.  Able to teach back most of our discussion without full drug name details etc. No signs of aphasia or neglect Cranial Nerves: II: Visual Fields are full. Pupils are equal, round, and reactive to light.   III,IV, VI: EOMI.  Mild left eye ptosis V: Facial sensation is symmetric to light touch  VII: Facial movement is symmetric.  VIII: hearing is intact to voice Sensory: Sensation reduced in the left arm and left leg Gait: Mildly stooped posture, reports gait at baseline, mildly wide based, able to rise on heels and toes and maintain tandem stance    Medications  Current Facility-Administered Medications:    acetaminophen (TYLENOL) tablet  650 mg, 650 mg, Oral, Q4H PRN **OR** acetaminophen (TYLENOL) 160 MG/5ML solution 650 mg, 650 mg, Per Tube, Q4H PRN **OR** acetaminophen (TYLENOL) suppository 650 mg, 650 mg, Rectal, Q4H PRN, Judithe Modest, NP   Chlorhexidine Gluconate Cloth 2 % PADS 6 each, 6 each, Topical, Daily, Assaker, Jean-Pierre, MD, 6 each at 04/17/23 0957   citalopram (CELEXA) tablet 40 mg, 40 mg, Oral, Daily, Lowella Bandy, RPH, 40 mg at 04/17/23 1056   gabapentin (NEURONTIN) capsule 300 mg, 300 mg,  Oral, TID, Lowella Bandy, RPH, 300 mg at 04/17/23 2215   insulin aspart (novoLOG) injection 0-15 Units, 0-15 Units, Subcutaneous, TID WC, Harlon Ditty D, NP, 2 Units at 04/17/23 1626   insulin aspart (novoLOG) injection 0-5 Units, 0-5 Units, Subcutaneous, QHS, Harlon Ditty D, NP   lamoTRIgine (LAMICTAL) tablet 25 mg, 25 mg, Oral, QHS, 25 mg at 04/17/23 2215 **FOLLOWED BY** [START ON 05/01/2023] lamoTRIgine (LAMICTAL) tablet 25 mg, 25 mg, Oral, BID, Inioluwa Boulay L, MD   pantoprazole (PROTONIX) injection 40 mg, 40 mg, Intravenous, QHS, Harlon Ditty D, NP, 40 mg at 04/17/23 2215   potassium chloride SA (KLOR-CON M) CR tablet 40 mEq, 40 mEq, Oral, Once, Ezequiel Essex, NP   rosuvastatin (CRESTOR) tablet 5 mg, 5 mg, Oral, Daily, Arlisha Patalano L, MD, 5 mg at 04/17/23 1055   senna-docusate (Senokot-S) tablet 1 tablet, 1 tablet, Oral, QHS PRN, Judithe Modest, NP  Labs and Diagnostic Imaging   CBC:  Recent Labs  Lab 04/16/23 1632 04/17/23 0403 04/18/23 0400  WBC 8.4 7.7 7.6  NEUTROABS 4.2  --   --   HGB 14.9 14.6 14.2  HCT 41.9 41.7 40.7  MCV 85.7 84.4 84.6  PLT 255 243 221    Basic Metabolic Panel:  Lab Results  Component Value Date   NA 133 (L) 04/18/2023   K 3.3 (L) 04/18/2023   CO2 26 04/18/2023   GLUCOSE 191 (H) 04/18/2023   BUN 18 04/18/2023   CREATININE 0.82 04/18/2023   CALCIUM 9.0 04/18/2023   GFRNONAA >60 04/18/2023   GFRAA 114 08/02/2019   Lipid Panel:  Lab Results  Component Value Date   LDLCALC 182 (H) 04/17/2023   HgbA1c:  Lab Results  Component Value Date   HGBA1C 9.1 (A) 04/13/2023   Urine Drug Screen:     Component Value Date/Time   LABOPIA NONE DETECTED 04/16/2023 1831   LABOPIA NONE DETECTED 07/17/2017 1839   COCAINSCRNUR NONE DETECTED 04/16/2023 1831   LABBENZ NONE DETECTED 04/16/2023 1831   LABBENZ NONE DETECTED 07/17/2017 1839   AMPHETMU NONE DETECTED 04/16/2023 1831   AMPHETMU NONE DETECTED 07/17/2017 1839   THCU NONE DETECTED  04/16/2023 1831   THCU NONE DETECTED 07/17/2017 1839   LABBARB NONE DETECTED 04/16/2023 1831   LABBARB (A) 07/17/2017 1839    Result not available. Reagent lot number recalled by manufacturer.    Alcohol Level     Component Value Date/Time   ETH <10 04/16/2023 1632   INR  Lab Results  Component Value Date   INR 1.1 04/16/2023   APTT  Lab Results  Component Value Date   APTT 30 04/16/2023   AED levels: No results found for: "PHENYTOIN", "ZONISAMIDE", "LAMOTRIGINE", "LEVETIRACETA"  CT Head without contrast(Personally reviewed): No acute intracranial process  CT angio Head and Neck with contrast(Personally reviewed): 1. No large vessel occlusion. 2. Similar moderate stenosis of the left PCA origin and right PCA anterior P2 segment. 3. No significant stenosis of the cervical  carotid or vertebral arteries. 4. Mild atherosclerosis of the right cavernous ICA without significant stenosis.   MRI Brain(to be personally reviewed): 1. No acute intracranial abnormality. 2. Old thalamic small vessel infarct and mild findings of chronic small vessel ischemia.  Echocardiogram:  Pending  Assessment   Gerald [name] is a 58 y.o. male s/p TNK for acute onset left-sided weakness concerning for recurrent stroke versus recrudescence of prior stroke versus anxiety/pain related. No new findings on MRI and patient back to last pre-stroke baseline.   Recommendations    #Acute left-sided weakness, potentially related to recrudescence of previous stroke versus recurrent stroke aborted by TNK - A1c not meeting goal at 9.1%, cholesterol not meeting goal with LDL at 182; expect improvement with reinitiation of medications now that patient has insurance - Start ASA 81 mg daily lifelong - Plavix 75 daily x 21 days after 300 mg loading dose - Blood pressure goal post tPA for 24  hours < 180/105 - Echocardiogram pending, can be completed outpatient per patient preference   #Concern for  Central pain syndrome as a sequelae of his prior stroke Paroxysmal pain on the left side, likely due to central pain syndrome following previous right thalamic stroke - Initiate lamotrigine with gradual dose escalation to manage pain - Educated on potential for rash with lamotrigine and importance of slow titration   Morning  Night  Week 1 and 2   none   25 mg   Week 3 and 4   25 mg    25 mg  Week 5   25 mg   50 mg  Week 6   50 mg   75 mg  Week 7   75 mg 100 mg  Week 8  100 mg 125 mg   #Medication overuse headache Frequent headaches potentially due to medication overuse, with regular use of acetaminophen and ibuprofen. Discussed the concept of medication overuse headache and the need to reduce analgesic use. CGRP medications like Nurtec were suggested as alternatives. - Educated on medication overuse headache and the need to limit analgesic use - Consider CGRP medications like Nurtec for headache management on an outpatient basis  #Hyperlipidemia Hyperlipidemia with previous adverse reaction to statins, including a rash. Discussed the potential for trying a low dose of rosuvastatin or atorvastatin, with monitoring for adverse reactions.  - Start rosuvastatin 5 mg daily now, titrate up gradually to minimize symptoms for goal LDL < 70  - Monitor for adverse reactions, particularly rash  #Dietary management Current carnivore diet may not be optimal for heart and brain health. Discussed the benefits of the Mediterranean diet for reducing cardiovascular and cerebrovascular risk, emphasizing fish, olive oil, and vegetables. - Provided educational materials on the Mediterranean diet  ______________________________________________________________________  Brooke Dare MD-PhD [facility] [phone] [facility] coverage for [facility] is from 8 AM to 4 AM in-house and 4 PM to 8 PM by telephone/video. 8 PM to 8 AM emergent questions or overnight urgent questions should be  addressed to Teleneurology On-call or [facility] neurohospitalist; contact information can be found on AMION

## 2023-04-18 NOTE — Discharge Summary (Addendum)
 NAME:  Gerald [name], MRN:  [id number], DOB:  05/17/1965, LOS: 1 ADMISSION DATE:  04/16/2023, CONSULTATION DATE:  04/16/23 REFERRING MD:  Dr. Fuller Plan, CHIEF COMPLAINT:  Acute onset Left sided weakness/numbness    Brief Pt Description / Synopsis:  58 y.o. male with PMHx of CVA, morbid obesity, hypertension, diabetes, hyperlipidemia, obstructive sleep apnea and coronary artery disease.  presenting with acute onset of Left sided weakness and numbness concerning for Acute Ischemic CVA, status post TNK.   History of Present Illness:  Gerald [name] is a 58 year old male with a past medical history significant for right sided thalamic lacunar infarct in October 2024, morbid obesity, hypertension, diabetes, hyperlipidemia, obstructive sleep apnea and coronary artery disease who presents to [facility] ED on 04/16/23 due to sudden onset of left-sided weakness and numbness today at 15:30.    He reports his symptoms are quite similar to previous stroke he had in October 2024 when he presented with similar symptoms. Symptoms recurred this afternoon, along with report of associated headache, slightly slurred speech, and palpitations. He denies any facial weakness, chest pain, shortness of breath,    ED Course: Initial Vital Signs: Temperature 98.5 F orally, pulse 74, respiratory rate 17, blood pressure 141/61, SpO2 100% on room air Significant Labs: Sodium 134, potassium 3.2, glucose 168, BUN 44, creatinine 1.0 Imaging CT Head w/o Contrast>>IMPRESSION: 1. No CT evidence of acute intracranial abnormality. 2. Mild chronic microvascular ischemic changes. Small remote lacunar infarcts in the right thalamus and right basal ganglia. 3. ASPECTS is 10 CTa Head & Neck>>IMPRESSION: 1. No large vessel occlusion. 2. Similar moderate stenosis of the left PCA origin and right PCA anterior P2 segment. 3. No significant stenosis of the cervical carotid or vertebral arteries. 4. Mild atherosclerosis of the right  cavernous ICA without significant stenosis. Medications Administered: TNK @ 16:51 on 04/16/23   Code Stroke was called and patient was evaluated by Neurology.  Decision made to administer TNK.  PCCM asked to admit for further workup and treatment.   Please see "Significant Hospital Events" section below for full detailed hospital course.  Significant Hospital Events: Including procedures, antibiotic start and stop dates in addition to other pertinent events   3/13: Presented to ED with acute onset left sided weakness, code stroke initiated, evaluated by Teleneurology, given TNK.  PCCM asked to admit.  Post PCCM assessment, pt complaining of worsening headache and right leg numbness, repeat STAT Head CT without evidence of bleed.  Neurology following. 3/14: No significant events noted overnight. Neuro deficits greatly improved, pt reports back to baseline.  Awaiting Echo and MRI Brain this evening, PT/OT to evaluate once 24 hrs post TNK.  Interim History / Subjective:  No complaints overnight.  MRI negative for acute ischemic stroke.     Physical Exam  GEN NAD  HEENT Supple neck reactive pupils  CVS Normal S1, Normal S2, RRR  Lungs Clear bilat air entry  Abdomen: Soft, non tender, non distended  Neur: CN II to XII intact, LUE and LLE strength improved now 4/5, intact RUE and RLL.   Labs and imaging were reiewed.   A&P   #Acute onset of Left sided Weakness/Numbness #Concern for Acute Ischemic CVA, status post TNK @ 16:51 on 04/16/23 #Concern for central pain syndrome started on lamictal in hospital.  PMHx: right Thalamic lacunar CVA, peripheral neuropathy, HTN, HLD  Imrpoved neeuro exam. Now intact, MRI brain 03/14 without any signs of acute ischemic stroke.   []  Safe for discharge home  []   Continue Aspirin 81mg  daily, Plavix 75mg  PO daily, Rosuvastatin 5mg  PO daily.  []  Follow up w/ PCP in 1 to 2 weeks and obtain outpatient echocardiogram. Will likely also need a holter monitor to  asses for A.fib, no signs of A.fib in hospital.  []  Resume home hypertension medications. []  Take Lamictal 25mg  PO at bedtime for a total of 14 days and then 25mg  twice a day (starting 04/30/2023).    #OSA on CPAP #Diabetes Mellitus Type II - Resume home diabetic medications. HbA1C 9.5 in hospital, will follow up with PCP.   Janann Colonel, MD [facility] 04/18/2023 9:47 AM

## 2023-04-18 NOTE — Progress Notes (Addendum)
 NAME:  Gerald [name], MRN:  [id number], DOB:  Aug 22, 1965, LOS: 1 ADMISSION DATE:  04/16/2023, CONSULTATION DATE:  04/16/23 REFERRING MD:  Dr. Fuller Plan, CHIEF COMPLAINT:  Acute onset Left sided weakness/numbness    Brief Pt Description / Synopsis:  58 y.o. male with PMHx of CVA, morbid obesity, hypertension, diabetes, hyperlipidemia, obstructive sleep apnea and coronary artery disease.  presenting with acute onset of Left sided weakness and numbness concerning for Acute Ischemic CVA, status post TNK.   History of Present Illness:  Gerald [name] is a 58 year old male with a past medical history significant for right sided thalamic lacunar infarct in October 2024, morbid obesity, hypertension, diabetes, hyperlipidemia, obstructive sleep apnea and coronary artery disease who presents to [facility] ED on 04/16/23 due to sudden onset of left-sided weakness and numbness today at 15:30.    He reports his symptoms are quite similar to previous stroke he had in October 2024 when he presented with similar symptoms. Symptoms recurred this afternoon, along with report of associated headache, slightly slurred speech, and palpitations. He denies any facial weakness, chest pain, shortness of breath,    ED Course: Initial Vital Signs: Temperature 98.5 F orally, pulse 74, respiratory rate 17, blood pressure 141/61, SpO2 100% on room air Significant Labs: Sodium 134, potassium 3.2, glucose 168, BUN 44, creatinine 1.0 Imaging CT Head w/o Contrast>>IMPRESSION: 1. No CT evidence of acute intracranial abnormality. 2. Mild chronic microvascular ischemic changes. Small remote lacunar infarcts in the right thalamus and right basal ganglia. 3. ASPECTS is 10 CTa Head & Neck>>IMPRESSION: 1. No large vessel occlusion. 2. Similar moderate stenosis of the left PCA origin and right PCA anterior P2 segment. 3. No significant stenosis of the cervical carotid or vertebral arteries. 4. Mild atherosclerosis of the right  cavernous ICA without significant stenosis. Medications Administered: TNK @ 16:51 on 04/16/23   Code Stroke was called and patient was evaluated by Neurology.  Decision made to administer TNK.  PCCM asked to admit for further workup and treatment.   Please see "Significant Hospital Events" section below for full detailed hospital course.   Significant Hospital Events: Including procedures, antibiotic start and stop dates in addition to other pertinent events   3/13: Presented to ED with acute onset left sided weakness, code stroke initiated, evaluated by Teleneurology, given TNK.  PCCM asked to admit.  Post PCCM assessment, pt complaining of worsening headache and right leg numbness, repeat STAT Head CT without evidence of bleed.  Neurology following. 3/14: No significant events noted overnight. Neuro deficits greatly improved, pt reports back to baseline.  Awaiting Echo and MRI Brain this evening, PT/OT to evaluate once 24 hrs post TNK.   Interim History / Subjective:  No complaints overnight.  MRI negative for acute ischemic stroke.      Physical Exam  GEN NAD  HEENT Supple neck reactive pupils  CVS Normal S1, Normal S2, RRR  Lungs Clear bilat air entry  Abdomen: Soft, non tender, non distended  Neur: CN II to XII intact, LUE and LLE strength improved now 4/5, intact RUE and RLL.    Labs and imaging were reiewed.    A&P    #Acute onset of Left sided Weakness/Numbness #Concern for Acute Ischemic CVA, status post TNK @ 16:51 on 04/16/23 #Concern for central pain syndrome started on lamictal in hospital.  PMHx: right Thalamic lacunar CVA, peripheral neuropathy, HTN, HLD   Imrpoved neeuro exam. Now intact, MRI brain 03/14 without any signs of acute ischemic stroke.    []   Safe for discharge home  []  Continue Aspirin 81mg  daily, Plavix 75mg  PO daily, Rosuvastatin 5mg  PO daily.  []  Follow up w/ PCP in 1 to 2 weeks and obtain outpatient echocardiogram. Will likely also need a holter  monitor to asses for A.fib, no signs of A.fib in hospital.  []  Resume home hypertension medications. []  Take Lamictal 25mg  PO at bedtime for a total of 14 days and then 25mg  twice a day (starting 04/30/2023).    #OSA on CPAP #Diabetes Mellitus Type II - Resume home diabetic medications. HbA1C 9.5 in hospital, will follow up with PCP.    I spent 35 minutes caring for this patient today, including preparing to see the patient, obtaining a medical history , reviewing a separately obtained history, performing a medically appropriate examination and/or evaluation, counseling and educating the patient/family/caregiver, ordering medications, tests, or procedures, documenting clinical information in the electronic health record, and independently interpreting results (not separately reported/billed) and communicating results to the patient/family/caregiver  Janann Colonel, MD [facility] 04/18/2023 9:51 AM

## 2023-04-18 NOTE — TOC Transition Note (Signed)
 Transition of Care [facility]) - Discharge Note   Patient Details  Name: Gerald [name] MRN: [id number] Date of Birth: February 27, 1965  Transition of Care [facility]) CM/SW Contact:  Bing Quarry, RN Phone Number: 04/18/2023, 10:12 AM   Clinical Narrative:  3/15: Discharge orders in for today from ICU. SDOH needs alert on Food Insecurity. Added Resources via AVS and requested a reprint to capture these to Unit RN. No TOC consult. Has PCP.    Gabriel Cirri MSN RN CM  RN Case Manager [place]  Transitions of Care Direct Dial: [phone] (Weekends Only) [facility] Main Office Phone: [phone] [facility] Fax: [phone] [email or web address]          Patient Goals and CMS Choice            Discharge Placement                       Discharge Plan and Services Additional resources added to the After Visit Summary for                                       Social Drivers of Health (SDOH) Interventions SDOH Screenings   Food Insecurity: Food Insecurity Present (04/17/2023)  Housing: Low Risk  (04/17/2023)  Transportation Needs: No Transportation Needs (04/17/2023)  Utilities: Not At Risk (04/17/2023)  Alcohol Screen: Low Risk  (08/21/2020)  Depression (PHQ2-9): Low Risk  (09/01/2022)  Social Connections: Unknown (11/18/2021)   Received from [facility]  Tobacco Use: Medium Risk (04/16/2023)     Readmission Risk Interventions     No data to display

## 2023-04-20 ENCOUNTER — Encounter: Payer: Self-pay | Admitting: Physician Assistant

## 2023-04-20 ENCOUNTER — Ambulatory Visit: Admitting: Physician Assistant

## 2023-04-20 VITALS — BP 130/72 | HR 80 | Temp 98.4°F | Resp 16 | Ht 74.0 in | Wt 301.4 lb

## 2023-04-20 DIAGNOSIS — Z09 Encounter for follow-up examination after completed treatment for conditions other than malignant neoplasm: Secondary | ICD-10-CM

## 2023-04-20 DIAGNOSIS — E1165 Type 2 diabetes mellitus with hyperglycemia: Secondary | ICD-10-CM | POA: Diagnosis not present

## 2023-04-20 DIAGNOSIS — Z8673 Personal history of transient ischemic attack (TIA), and cerebral infarction without residual deficits: Secondary | ICD-10-CM

## 2023-04-20 DIAGNOSIS — I10 Essential (primary) hypertension: Secondary | ICD-10-CM | POA: Diagnosis not present

## 2023-04-20 DIAGNOSIS — G5793 Unspecified mononeuropathy of bilateral lower limbs: Secondary | ICD-10-CM

## 2023-04-20 DIAGNOSIS — M79605 Pain in left leg: Secondary | ICD-10-CM | POA: Diagnosis not present

## 2023-04-20 DIAGNOSIS — I6930 Unspecified sequelae of cerebral infarction: Secondary | ICD-10-CM

## 2023-04-20 DIAGNOSIS — F411 Generalized anxiety disorder: Secondary | ICD-10-CM

## 2023-04-20 NOTE — Progress Notes (Signed)
 [facility] [address] [place], [place] [address]  Internal MEDICINE  Office Visit Note  Patient Name: Gerald [name]  [id number]  [id number]  Date of Service: 04/24/2023     Chief Complaint  Patient presents with   Hospitalization Follow-up   stroke     HPI Pt is here for recent hospital follow up. Gerald [name] to ED on 04/16/23 with acute left sided weakness and numbness concerning for new stroke. He previously had similar symptoms in Oct 2024 when he did have right sided thalamic lacunar infarct -He states his residual symptoms from previous stroke were exacerbated with worsening numnbness and weakness on left side, headache and pressure, which he still feels into neck -Code stroke called. Neuro consulted and he was started on TNK and was admitted for further workup. His CT and CTA did not show acute abnormality. MRI also did not show acute changes -He was started on plavix and 81mg  ASA. Also started on crestor 5mg --tolerating ok despite previous issues with statin. Also started on lamictal to help with pain and was cleared for discharge on 04/18/23 -Advised to plan for echo as OP and may need long term monitor for afib evaluation. 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 Unable to get to bed at night due to pain upon leaving work. He is sleeping in recliner with cpap, but only getting 3-4 hours. This was problematic before recent hospitalization and now with symptoms exacerbated he does not feel he can continue with work demands at this time. 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  -he is starting PT tomorrow morning,  unfortunately was unable to do this after initial stroke due to loss of insurance, but would still benefit from this now -flexeril was too strong, made him too groggy so holding off on this. -OP neurology consult this Wednesday, will be seeing Dr. Malvin [name] --ozempic not covered, will try alternative  Current Medication: Outpatient Encounter Medications as of 04/20/2023  Medication Sig   aspirin EC 81 MG tablet Take 1 tablet (81 mg total) by mouth daily. Swallow whole.   citalopram (CELEXA) 40 MG tablet TAKE 1 TABLET BY MOUTH EVERY DAY   clopidogrel (PLAVIX) 75 MG tablet Take 1 tablet (75 mg total) by mouth daily.   cyclobenzaprine (FLEXERIL) 10 MG tablet Take 1 tablet (10 mg total) by mouth at bedtime. Take one tab po qhs for back spasm prn only   furosemide (LASIX) 20 MG tablet TAKE 1 TABLET BY MOUTH EVERY DAY AS NEEDED FOR LEG SWELLING   gabapentin (NEURONTIN) 300 MG capsule Take 1 capsule (300 mg total) by mouth 2 (two) times daily. (Patient taking differently: Take 300 mg by mouth 3 (three) times daily.)   lamoTRIgine (LAMICTAL) 25 MG tablet Take 1 tablet (25 mg total) by mouth at bedtime for 14 days, THEN 1 tablet (25 mg total) 2 (two) times daily for 14 days.   lisinopril-hydrochlorothiazide (ZESTORETIC) 20-25 MG tablet Take 1 tablet by mouth daily.   Multiple Vitamins-Minerals (MULTIVITAMIN ADULTS PO) Take 1 tablet by mouth daily.   nitroGLYCERIN (NITROSTAT) 0.4 MG SL tablet Place 1 tablet (0.4  mg total) under the tongue every 5 (five) minutes x 3 doses as needed for chest pain.   rosuvastatin (CRESTOR) 5 MG tablet Take 1 tablet (5 mg total) by mouth at bedtime.   Semaglutide,0.25 or 0.5MG /DOS, 2 MG/3ML SOPN Inject 0.25 mg into the skin once a week.   No facility-administered encounter medications on file as of 04/20/2023.    Surgical History: Past Surgical History:  Procedure Laterality Date   BONE EXCISION Right 11/21/2020   Procedure: PART EXCISION BONE- PHALANX;  Surgeon: Gwyneth Revels, DPM;  Location: [facility];  Service: Podiatry;  Laterality: Right;   CARDIAC CATHETERIZATION N/A 09/2011   [facility]; EF 50% with 30% mid LAD stenosis and no obstructive disease.   CARDIAC CATHETERIZATION  09/2010   [facility]; Mid LAD 40% stenosis; Mid Circumflex:Normal; Mid RCA; Normal   CARDIAC CATHETERIZATION     COLONOSCOPY     COLONOSCOPY WITH PROPOFOL N/A 04/10/2021   Procedure: COLONOSCOPY WITH PROPOFOL;  Surgeon: Wyline Mood, MD;  Location: [facility] ENDOSCOPY;  Service: Gastroenterology;  Laterality: N/A;   ESOPHAGOGASTRODUODENOSCOPY N/A 04/10/2021   Procedure: ESOPHAGOGASTRODUODENOSCOPY (EGD);  Surgeon: Wyline Mood, MD;  Location: [facility] ENDOSCOPY;  Service: Gastroenterology;  Laterality: N/A;   ESOPHAGOGASTRODUODENOSCOPY (EGD) WITH PROPOFOL N/A 06/10/2017   Procedure: ESOPHAGOGASTRODUODENOSCOPY (EGD) WITH PROPOFOL;  Surgeon: Wyline Mood, MD;  Location[facility] ENDOSCOPY;  Service: Gastroenterology;  Laterality: N/A;   ESOPHAGOGASTRODUODENOSCOPY (EGD) WITH PROPOFOL N/A 01/01/2022   Procedure: ESOPHAGOGASTRODUODENOSCOPY (EGD) WITH PROPOFOL;  Surgeon: Wyline Mood, MD;  Location: [facility] ENDOSCOPY;  Service: Gastroenterology;  Laterality: N/A;   ESOPHAGOGASTRODUODENOSCOPY (EGD) WITH PROPOFOL N/A 01/06/2022   Procedure: ESOPHAGOGASTRODUODENOSCOPY (EGD) WITH PROPOFOL;  Surgeon: Wyline Mood, MD;  Location: [facility] ENDOSCOPY;  Service: Gastroenterology;  Laterality: N/A;   HAMMER TOE SURGERY Right 11/21/2020   Procedure: HAMMERTOE CORRECTION;  Surgeon: Gwyneth Revels, DPM;  Location: [facility];  Service: Podiatry;  Laterality: Right;   INSERTION OF MESH  04/16/2021   Procedure: INSERTION OF MESH;  Surgeon: Henrene Dodge, MD;  Location: [facility] ORS;  Service: General;;   KNEE ARTHROSCOPY WITH MEDIAL MENISECTOMY Right 09/16/2012   Procedure: RIGHT KNEE ARTHROSCOPY WITH MEDIAL AND LATERAL MENISECTOMY, CHONDROPLASTY;  Surgeon: Loreta Ave, MD;  Location: [facility];  Service: Orthopedics;   Laterality: Right;  RIGHT KNEE SCOPE MEDIAL MENISCECTOMY   LEFT HEART CATH AND CORONARY ANGIOGRAPHY N/A 06/11/2016   Procedure: Left Heart Cath and Coronary Angiography;  Surgeon: Antonieta Iba, MD;  Location: [facility] INVASIVE CV LAB;  Service: Cardiovascular;  Laterality: N/A;   UMBILICAL HERNIA REPAIR N/A 04/16/2021   Procedure: HERNIA REPAIR UMBILICAL ADULT, open;  Surgeon: Henrene Dodge, MD;  Location: [facility] ORS;  Service: General;  Laterality: N/A;    Medical History: Past Medical History:  Diagnosis Date   Abnormal LFTs    Anxiety    Atypical chest pain    Borderline diabetes    CAD (coronary artery disease)    a.) PCI and stent placement (unknown type) to mLAD and mLCx; date unknown. b.) LHC 09/10/2010: 40% mLAD; no intervention. c.) LHC 09/12/2011: EF 60%; 30% ISR mLAD, 40% dLAD, 30% pLCx, 30% ISR mLCx, 40% mRCA; no intervention. d.) LHC 05/23/2014: EF >55%; 30% p-mLAD; no intervention. e.) LHC 06/11/2016: EF 55-65%; LVEDP norm; 30-40% p-mLAD; no intervention.   Carcinoma (HCC)    Chronic pain syndrome    Depression    Fundic gland polyps of stomach, benign    Gastritis    GERD (gastroesophageal reflux disease)    Hepatic steatosis  Hyperlipidemia    Hypertension    Migraines    Myocardial infarction [facility]) 2008   was told by PCP   Nonischemic cardiomyopathy (HCC)    a.) TTE 09/12/2011: EF 35-45%. b.) LHC 09/12/2011: EF 60%. c.) TTE 06/04/2012: EF 55-60%. d.) LHC 05/23/2014: EF >55%. e.) TTE 06/11/2016: EF 55-60%; G1DD. f.) LHC 06/11/2016: EF 55-65%. g.) TTE 01/08/2018: EF 60-65%.   Obesity    Occlusion and stenosis of bilateral carotid arteries    OSA on CPAP    Osteoarthritis of both knees    Shortness of breath    Squamous cell carcinoma of skin 02/27/2020   left distal lat deltoid - [facility]    Family History: Family History  Problem Relation Age of Onset   Heart disease Father 11       MI   Heart attack Father    Stomach cancer Father    Hypertension Mother     Breast cancer Mother     Social History   Socioeconomic History   Marital status: Married    Spouse name: Not on file   Number of children: 3   Years of education: Not on file   Highest education level: Not on file  Occupational History   Occupation: Camera operator  Tobacco Use   Smoking status: Never    Passive exposure: Past   Smokeless tobacco: Former    Types: Chew    Quit date: 1987  Vaping Use   Vaping status: Never Used  Substance and Sexual Activity   Alcohol use: Not Currently   Drug use: No   Sexual activity: Yes  Other Topics Concern   Not on file  Social History Narrative   Married.  14 yo son.   Wife on disability for bipolar disorder.     Social Drivers of Corporate investment banker Strain: Not on file  Food Insecurity: Food Insecurity Present (04/17/2023)   Hunger Vital Sign    Worried About Running Out of Food in the Last Year: Sometimes true    Ran Out of Food in the Last Year: Sometimes true  Transportation Needs: No Transportation Needs (04/17/2023)   PRAPARE - Administrator, Civil Service (Medical): No    Lack of Transportation (Non-Medical): No  Physical Activity: Not on file  Stress: Not on file  Social Connections: Unknown (11/18/2021)   Received from [facility]   Social Network    Social Network: Not on file  Intimate Partner Violence: Not At Risk (04/17/2023)   Humiliation, Afraid, Rape, and Kick questionnaire    Fear of Current or Ex-Partner: No    Emotionally Abused: No    Physically Abused: No    Sexually Abused: No      Review of Systems  Constitutional:  Positive for fatigue. Negative for chills and unexpected weight change.  HENT:  Negative for congestion, postnasal drip, rhinorrhea, sneezing and sore throat.   Eyes:  Negative for redness.  Respiratory:  Negative for cough, chest tightness and shortness of breath.   Cardiovascular:  Negative for chest pain and palpitations.  Gastrointestinal:  Negative for  abdominal pain, anal bleeding, constipation, diarrhea, nausea and vomiting.  Genitourinary:  Negative for dysuria and frequency.  Musculoskeletal:  Positive for arthralgias, back pain, gait problem and myalgias. Negative for joint swelling and neck pain.  Skin:  Negative for rash.  Neurological:  Positive for weakness and numbness. Negative for tremors.  Hematological:  Negative for adenopathy. Does not bruise/bleed easily.  Psychiatric/Behavioral:  Positive for behavioral problems (Depression) and sleep disturbance. Negative for self-injury and suicidal ideas. The patient is nervous/anxious.     Vital Signs: BP 130/72   Pulse 80   Temp 98.4 F (36.9 C)   Resp 16   Ht 6\' 2"  (1.88 m)   Wt (!) 301 lb 6.4 oz (136.7 kg)   SpO2 96%   BMI 38.70 kg/m    Physical Exam Vitals and nursing note reviewed.  Constitutional:      General: He is not in acute distress.    Appearance: Normal appearance. He is well-developed. He is obese. He is not diaphoretic.  HENT:     Head: Normocephalic and atraumatic.  Neck:     Thyroid: No thyromegaly.     Vascular: No JVD.     Trachea: No tracheal deviation.  Cardiovascular:     Rate and Rhythm: Normal rate and regular rhythm.     Heart sounds: Normal heart sounds. No murmur heard.    No friction rub. No gallop.  Pulmonary:     Effort: Pulmonary effort is normal. No respiratory distress.     Breath sounds: No wheezing or rales.  Chest:     Chest wall: No tenderness.  Musculoskeletal:     Cervical back: Normal range of motion and neck supple.  Lymphadenopathy:     Cervical: No cervical adenopathy.  Skin:    General: Skin is warm and dry.  Neurological:     Mental Status: He is alert and oriented to person, place, and time.     Cranial Nerves: No cranial nerve deficit.     Sensory: Sensory deficit present.     Gait: Gait abnormal.  Psychiatric:        Thought Content: Thought content normal.        Judgment: Judgment normal.        Assessment/Plan: 1. Hospital discharge follow-up (Primary) Reviewed hospitalization and med changes, will follow up with cardiology regarding echo and heart monitoring--advised to let office know if this is not arranged  2. History of stroke with current residual effects Will be seeing neurology this week  3. Type 2 diabetes mellitus with hyperglycemia, unspecified whether long term insulin use (HCC) Ozempic not covered, will try for alternative  4. Essential hypertension Stable, continue current medication  5. Pain of left lower extremity Continue current medication and will establish with neurology for residual symptoms from stroke  6. Neuropathy involving both lower extremities Continue gabapentin and establish with neurology    General Counseling: imari sivertsen understanding of the findings of todays visit and agrees with plan of treatment. I have discussed any further diagnostic evaluation that may be needed or ordered today. We also reviewed his medications today. he has been encouraged to call the office with any questions or concerns that should arise related to todays visit.    Counseling:    No orders of the defined types were placed in this encounter.   This patient was seen by Lynn Ito, PA-C in collaboration with Dr. Beverely Risen as a part of collaborative care agreement.   I have reviewed all medical records from hospital follow up including radiology reports and consults from other physicians. Appropriate follow up diagnostics will be scheduled as needed. Patient/ Family understands the plan of treatment. Time spent 45 minutes.   Dr Lyndon Code, MD Internal Medicine

## 2023-04-22 ENCOUNTER — Other Ambulatory Visit

## 2023-04-24 MED ORDER — TRULICITY 0.75 MG/0.5ML ~~LOC~~ SOAJ
0.7500 mg | SUBCUTANEOUS | 2 refills | Status: DC
Start: 2023-04-24 — End: 2023-05-11

## 2023-04-24 MED ORDER — CITALOPRAM HYDROBROMIDE 40 MG PO TABS: ORAL_TABLET | ORAL | 1 refills | Status: DC

## 2023-04-28 ENCOUNTER — Ambulatory Visit

## 2023-04-28 ENCOUNTER — Ambulatory Visit: Attending: Cardiology | Admitting: Cardiology

## 2023-04-28 ENCOUNTER — Encounter: Payer: Self-pay | Admitting: Cardiology

## 2023-04-28 VITALS — BP 119/80 | HR 72 | Ht 74.0 in | Wt 302.6 lb

## 2023-04-28 DIAGNOSIS — I639 Cerebral infarction, unspecified: Secondary | ICD-10-CM

## 2023-04-28 DIAGNOSIS — E782 Mixed hyperlipidemia: Secondary | ICD-10-CM | POA: Diagnosis not present

## 2023-04-28 DIAGNOSIS — I2510 Atherosclerotic heart disease of native coronary artery without angina pectoris: Secondary | ICD-10-CM | POA: Diagnosis not present

## 2023-04-28 DIAGNOSIS — I10 Essential (primary) hypertension: Secondary | ICD-10-CM

## 2023-04-28 MED ORDER — REPATHA SURECLICK 140 MG/ML ~~LOC~~ SOAJ: 140.0000 mg | SUBCUTANEOUS | 6 refills | Status: DC

## 2023-04-28 NOTE — Patient Instructions (Addendum)
 Medication Instructions:  Your physician has recommended you make the following change in your medication:   START Repatha 140 mg inject one every 14 days  *If you need a refill on your cardiac medications before your next appointment, please call your pharmacy*   Lab Work: None  If you have labs (blood work) drawn today and your tests are completely normal, you will receive your results only by: MyChart Message (if you have MyChart) OR A paper copy in the mail If you have any lab test that is abnormal or we need to change your treatment, we will call you to review the results.   Testing/Procedures: Your physician has recommended that you wear a Zio monitor for 1 month. You will wear monitor #1 for 14 days then another monitor #2 for another 14 days   This monitor is a medical device that records the heart's electrical activity. Doctors most often use these monitors to diagnose arrhythmias. Arrhythmias are problems with the speed or rhythm of the heartbeat. The monitor is a small device applied to your chest. You can wear one while you do your normal daily activities. While wearing this monitor if you have any symptoms to push the button and record what you felt. Once you have worn this monitor for the period of time provider prescribed (Usually 14 days), you will return the monitor device in the postage paid box. Once it is returned they will download the data collected and provide [place] with a report which the provider will then review and we will call you with those results. Important tips:  Avoid showering during the first 24 hours of wearing the monitor. Avoid excessive sweating to help maximize wear time. Do not submerge the device, no hot tubs, and no swimming pools. Keep any lotions or oils away from the patch. After 24 hours you may shower with the patch on. Take brief showers with your back facing the shower head.  Do not remove patch once it has been placed because that will  interrupt data and decrease adhesive wear time. Push the button when you have any symptoms and write down what you were feeling. Once you have completed wearing your monitor, remove and place into box which has postage paid and place in your outgoing mailbox.  If for some reason you have misplaced your box then call our office and we can provide another box and/or mail it off for you.      Follow-Up: At [facility], you and your health needs are our priority.  As part of our continuing mission to provide you with exceptional heart care, we have created designated Provider Care Teams.  These Care Teams include your primary Cardiologist (physician) and Advanced Practice Providers (APPs -  Physician Assistants and Nurse Practitioners) who all work together to provide you with the care you need, when you need it.  We recommend signing up for the patient portal called "MyChart".  Sign up information is provided on this After Visit Summary.  MyChart is used to connect with patients for Virtual Visits (Telemedicine).  Patients are able to view lab/test results, encounter notes, upcoming appointments, etc.  Non-urgent messages can be sent to your provider as well.   To learn more about what you can do with MyChart, go to [email or web address].    Your next appointment:   3 month(s)  Provider:   Debbe Odea, MD

## 2023-04-28 NOTE — Progress Notes (Signed)
 Cardiology Office Note:    Date:  04/28/2023   ID:  Gerald [name], DOB [birth year], MRN [id number]  PCP:  Alan Ripper   [facility] Providers Cardiologist:  Debbe Odea, MD     Referring MD: Carlean Jews, PA*   Chief Complaint  Patient presents with   Follow-up    Following up from the hospital. Patient states that he has been better. Patient states that he is real weak on his left side from head to toe. Meds reviewed.     History of Present Illness:    Gerald [name] is a 58 y.o. male with a hx of nonobstructive CAD (40% pLAD), hyperlipidemia, CVA x 2 (on 10/24, 3/25) presenting for hospital follow-up.  Admitted earlier this morning due to left-sided weakness, slurred speech.  Diagnosed with acute ischemic stroke s/p TNK.  MRI brain showed no acute intracranial abnormality, old thalamic infarct noted.  Plavix was added to aspirin on discharge.  Low-dose Crestor 5 mg p.o. started.  Patient has history of statin intolerance.  States tolerating Crestor at current dose.   He is currently undergoing physical therapy.  Still has left-sided weakness although strength does improve mildly.  Still has gait instability.  Prior notes/testing Echo 11/2022 EF 50 to 55%. Echo 2019 EF 60 to 65% Left heart cath 2018 nonobstructive CAD 40% proximal LAD.   Past Medical History:  Diagnosis Date   Abnormal LFTs    Anxiety    Atypical chest pain    Borderline diabetes    CAD (coronary artery disease)    a.) PCI and stent placement (unknown type) to mLAD and mLCx; date unknown. b.) LHC 09/10/2010: 40% mLAD; no intervention. c.) LHC 09/12/2011: EF 60%; 30% ISR mLAD, 40% dLAD, 30% pLCx, 30% ISR mLCx, 40% mRCA; no intervention. d.) LHC 05/23/2014: EF >55%; 30% p-mLAD; no intervention. e.) LHC 06/11/2016: EF 55-65%; LVEDP norm; 30-40% p-mLAD; no intervention.   Carcinoma (HCC)    Chronic pain syndrome    Depression    Fundic gland polyps of  stomach, benign    Gastritis    GERD (gastroesophageal reflux disease)    Hepatic steatosis    Hyperlipidemia    Hypertension    Migraines    Myocardial infarction [facility]) 2008   was told by PCP   Nonischemic cardiomyopathy (HCC)    a.) TTE 09/12/2011: EF 35-45%. b.) LHC 09/12/2011: EF 60%. c.) TTE 06/04/2012: EF 55-60%. d.) LHC 05/23/2014: EF >55%. e.) TTE 06/11/2016: EF 55-60%; G1DD. f.) LHC 06/11/2016: EF 55-65%. g.) TTE 01/08/2018: EF 60-65%.   Obesity    Occlusion and stenosis of bilateral carotid arteries    OSA on CPAP    Osteoarthritis of both knees    Shortness of breath    Squamous cell carcinoma of skin 02/27/2020   left distal lat deltoid - [facility]    Past Surgical History:  Procedure Laterality Date   BONE EXCISION Right 11/21/2020   Procedure: PART EXCISION BONE- PHALANX;  Surgeon: Gwyneth Revels, DPM;  Location: [facility];  Service: Podiatry;  Laterality: Right;   CARDIAC CATHETERIZATION N/A 09/2011   [facility]; EF 50% with 30% mid LAD stenosis and no obstructive disease.   CARDIAC CATHETERIZATION  09/2010   [facility]; Mid LAD 40% stenosis; Mid Circumflex:Normal; Mid RCA; Normal   CARDIAC CATHETERIZATION     COLONOSCOPY     COLONOSCOPY WITH PROPOFOL N/A 04/10/2021   Procedure: COLONOSCOPY WITH PROPOFOL;  Surgeon: Wyline Mood, MD;  Location: [facility] ENDOSCOPY;  Service: Gastroenterology;  Laterality: N/A;   ESOPHAGOGASTRODUODENOSCOPY N/A 04/10/2021   Procedure: ESOPHAGOGASTRODUODENOSCOPY (EGD);  Surgeon: Wyline Mood, MD;  Location: [facility] ENDOSCOPY;  Service: Gastroenterology;  Laterality: N/A;   ESOPHAGOGASTRODUODENOSCOPY (EGD) WITH PROPOFOL N/A 06/10/2017   Procedure: ESOPHAGOGASTRODUODENOSCOPY (EGD) WITH PROPOFOL;  Surgeon: Wyline Mood, MD;  Location: [facility] ENDOSCOPY;  Service: Gastroenterology;  Laterality: N/A;   ESOPHAGOGASTRODUODENOSCOPY (EGD) WITH PROPOFOL N/A 01/01/2022   Procedure: ESOPHAGOGASTRODUODENOSCOPY (EGD) WITH PROPOFOL;  Surgeon: Wyline Mood, MD;  Location: [facility]  ENDOSCOPY;  Service: Gastroenterology;  Laterality: N/A;   ESOPHAGOGASTRODUODENOSCOPY (EGD) WITH PROPOFOL N/A 01/06/2022   Procedure: ESOPHAGOGASTRODUODENOSCOPY (EGD) WITH PROPOFOL;  Surgeon: Wyline Mood, MD;  Location: [facility] ENDOSCOPY;  Service: Gastroenterology;  Laterality: N/A;   HAMMER TOE SURGERY Right 11/21/2020   Procedure: HAMMERTOE CORRECTION;  Surgeon: Gwyneth Revels, DPM;  [facility];  Service: Podiatry;  Laterality: Right;   INSERTION OF MESH  04/16/2021   Procedure: INSERTION OF MESH;  Surgeon: Henrene Dodge, MD;  Location: [facility] ORS;  Service: General;;   KNEE ARTHROSCOPY WITH MEDIAL MENISECTOMY Right 09/16/2012   Procedure: RIGHT KNEE ARTHROSCOPY WITH MEDIAL AND LATERAL MENISECTOMY, CHONDROPLASTY;  Surgeon: Loreta Ave, MD;  Location: [facility];  Service: Orthopedics;  Laterality: Right;  RIGHT KNEE SCOPE MEDIAL MENISCECTOMY   LEFT HEART CATH AND CORONARY ANGIOGRAPHY N/A 06/11/2016   Procedure: Left Heart Cath and Coronary Angiography;  Surgeon: Antonieta Iba, MD;  Location: [facility] INVASIVE CV LAB;  Service: Cardiovascular;  Laterality: N/A;   UMBILICAL HERNIA REPAIR N/A 04/16/2021   Procedure: HERNIA REPAIR UMBILICAL ADULT, open;  Surgeon: Henrene Dodge, MD;  Location: [facility] ORS;  Service: General;  Laterality: N/A;    Current Medications: Current Meds  Medication Sig   aspirin EC 81 MG tablet Take 1 tablet (81 mg total) by mouth daily. Swallow whole.   citalopram (CELEXA) 40 MG tablet TAKE 1 TABLET BY MOUTH EVERY DAY   clopidogrel (PLAVIX) 75 MG tablet Take 1 tablet (75 mg total) by mouth daily.   cyclobenzaprine (FLEXERIL) 10 MG tablet Take 1 tablet (10 mg total) by mouth at bedtime. Take one tab po qhs for back spasm prn only   Dulaglutide (TRULICITY) 0.75 MG/0.5ML SOAJ Inject 0.75 mg into the skin once a week.   Evolocumab (REPATHA SURECLICK) 140 MG/ML SOAJ Inject 140 mg into the skin every 14 (fourteen) days.   furosemide (LASIX) 20 MG  tablet TAKE 1 TABLET BY MOUTH EVERY DAY AS NEEDED FOR LEG SWELLING   gabapentin (NEURONTIN) 300 MG capsule Take 1 capsule (300 mg total) by mouth 2 (two) times daily. (Patient taking differently: Take 300 mg by mouth 3 (three) times daily.)   lamoTRIgine (LAMICTAL) 25 MG tablet Take 1 tablet (25 mg total) by mouth at bedtime for 14 days, THEN 1 tablet (25 mg total) 2 (two) times daily for 14 days.   lisinopril-hydrochlorothiazide (ZESTORETIC) 20-25 MG tablet Take 1 tablet by mouth daily.   Multiple Vitamins-Minerals (MULTIVITAMIN ADULTS PO) Take 1 tablet by mouth daily.   nitroGLYCERIN (NITROSTAT) 0.4 MG SL tablet Place 1 tablet (0.4 mg total) under the tongue every 5 (five) minutes x 3 doses as needed for chest pain.   rosuvastatin (CRESTOR) 5 MG tablet Take 1 tablet (5 mg total) by mouth at bedtime.     Allergies:   Nexlizet [bempedoic acid-ezetimibe] and Statins   Social History   Socioeconomic History   Marital status: Married    Spouse name: Not on file   Number of children: 3  Years of education: Not on file   Highest education level: Not on file  Occupational History   Occupation: Camera operator  Tobacco Use   Smoking status: Never    Passive exposure: Past   Smokeless tobacco: Former    Types: Chew    Quit date: 1987  Vaping Use   Vaping status: Never Used  Substance and Sexual Activity   Alcohol use: Not Currently   Drug use: No   Sexual activity: Yes  Other Topics Concern   Not on file  Social History Narrative   Married.  65 yo son.   Wife on disability for bipolar disorder.     Social Drivers of Corporate investment banker Strain: Not on file  Food Insecurity: Food Insecurity Present (04/17/2023)   Hunger Vital Sign    Worried About Running Out of Food in the Last Year: Sometimes true    Ran Out of Food in the Last Year: Sometimes true  Transportation Needs: No Transportation Needs (04/17/2023)   PRAPARE - Administrator, Civil Service  (Medical): No    Lack of Transportation (Non-Medical): No  Physical Activity: Not on file  Stress: Not on file  Social Connections: Unknown (11/18/2021)   Received from [facility]   Social Network    Social Network: Not on file     Family History: The patient's family history includes Breast cancer in his mother; Heart attack in his father; Heart disease (age of onset: 76) in his father; Hypertension in his mother; Stomach cancer in his father.  ROS:   Please see the history of present illness.     All other systems reviewed and are negative.  EKGs/Labs/Other Studies Reviewed:    The following studies were reviewed today:   EKG Interpretation Date/Time:  Tuesday April 28 2023 08:15:02 EDT Ventricular Rate:  72 PR Interval:  196 QRS Duration:  88 QT Interval:  420 QTC Calculation: 459 R Axis:   -1  Text Interpretation: Normal sinus rhythm Nonspecific ST abnormality Confirmed by Debbe Odea ([id number]) on 04/28/2023 8:22:31 AM    Recent Labs: 09/08/2022: TSH 3.030 04/16/2023: ALT 42 04/18/2023: BUN 18; Creatinine, Ser 0.82; Hemoglobin 14.2; Magnesium 2.0; Platelets 221; Potassium 3.3; Sodium 133  Recent Lipid Panel    Component Value Date/Time   CHOL 262 (H) 04/17/2023 0403   CHOL 214 (H) 09/08/2022 1030   CHOL 205 (H) 04/01/2021 1335   TRIG 232 (H) 04/17/2023 0403   TRIG 239 (H) 04/01/2021 1335   HDL 34 (L) 04/17/2023 0403   HDL 32 (L) 09/08/2022 1030   HDL 34 (L) 09/13/2011 0307   CHOLHDL 7.7 04/17/2023 0403   VLDL 46 (H) 04/17/2023 0403   VLDL 27 09/13/2011 0307   LDLCALC 182 (H) 04/17/2023 0403   LDLCALC 120 (H) 09/08/2022 1030   LDLCALC 115 (H) 09/13/2011 0307   LDLDIRECT 145.9 11/08/2013 0911     Risk Assessment/Calculations:               Physical Exam:    VS:  BP 119/80   Pulse 72   Ht 6\' 2"  (1.88 m)   Wt (!) 302 lb 9.6 oz (137.3 kg)   SpO2 96%   BMI 38.85 kg/m     Wt Readings from Last 3 Encounters:  04/28/23 (!) 302 lb 9.6 oz  (137.3 kg)  04/20/23 (!) 301 lb 6.4 oz (136.7 kg)  04/16/23 (!) 312 lb 2.7 oz (141.6 kg)     GEN:  Well  nourished, well developed in no acute distress HEENT: Normal NECK: No JVD; No carotid bruits CARDIAC: RRR, no murmurs, rubs, gallops RESPIRATORY:  Clear to auscultation without rales, wheezing or rhonchi  ABDOMEN: Soft, non-tender, non-distended MUSCULOSKELETAL:  No edema; varicose veins in lower extremities noted. SKIN: Warm and dry NEUROLOGIC:  Alert and oriented x 3 PSYCHIATRIC:  Normal affect   ASSESSMENT:    1. Cerebrovascular accident (CVA), unspecified mechanism (HCC)   2. Coronary artery disease, unspecified vessel or lesion type, unspecified whether angina present, unspecified whether native or transplanted heart   3. Primary hypertension   4. Mixed hyperlipidemia    PLAN:    In order of problems listed above:  CVA x 2.  Left-sided weakness.  Place cardiac monitor/ZIO for 2 weeks x 2(total 4 weeks).  Continue aspirin, Plavix.  Start Repatha. Nonobstructive CAD 40% proximal LAD.  Denies chest pain.  Continue aspirin 81 mg, Plavix, start Repatha.  Intolerant to statins, Nexlizet.  On low-dose Crestor. Hypertension, BP controlled.  Continue lisinopril-HCTZ 20-25 mg daily. Hyperlipidemia, not tolerant to statins.  Start Repatha.  Follow-up 2 to 3 months      edication Adjustments/Labs and Tests Ordered: Current medicines are reviewed at length with the patient today.  Concerns regarding medicines are outlined above.  Orders Placed This Encounter  Procedures   LONG TERM MONITOR (3-14 DAYS)   EKG 12-Lead   Meds ordered this encounter  Medications   Evolocumab (REPATHA SURECLICK) 140 MG/ML SOAJ    Sig: Inject 140 mg into the skin every 14 (fourteen) days.    Dispense:  2 mL    Refill:  6    Patient Instructions  Medication Instructions:  Your physician has recommended you make the following change in your medication:   START Repatha 140 mg inject one every  14 days  *If you need a refill on your cardiac medications before your next appointment, please call your pharmacy*   Lab Work: None  If you have labs (blood work) drawn today and your tests are completely normal, you will receive your results only by: MyChart Message (if you have MyChart) OR A paper copy in the mail If you have any lab test that is abnormal or we need to change your treatment, we will call you to review the results.   Testing/Procedures: Your physician has recommended that you wear a Zio monitor for 1 month. You will wear monitor #1 for 14 days then another monitor #2 for another 14 days   This monitor is a medical device that records the heart's electrical activity. Doctors most often use these monitors to diagnose arrhythmias. Arrhythmias are problems with the speed or rhythm of the heartbeat. The monitor is a small device applied to your chest. You can wear one while you do your normal daily activities. While wearing this monitor if you have any symptoms to push the button and record what you felt. Once you have worn this monitor for the period of time provider prescribed (Usually 14 days), you will return the monitor device in the postage paid box. Once it is returned they will download the data collected and provide [place] with a report which the provider will then review and we will call you with those results. Important tips:  Avoid showering during the first 24 hours of wearing the monitor. Avoid excessive sweating to help maximize wear time. Do not submerge the device, no hot tubs, and no swimming pools. Keep any lotions or oils away from the patch.  After 24 hours you may shower with the patch on. Take brief showers with your back facing the shower head.  Do not remove patch once it has been placed because that will interrupt data and decrease adhesive wear time. Push the button when you have any symptoms and write down what you were feeling. Once you have completed  wearing your monitor, remove and place into box which has postage paid and place in your outgoing mailbox.  If for some reason you have misplaced your box then call our office and we can provide another box and/or mail it off for you.      Follow-Up: At [facility], you and your health needs are our priority.  As part of our continuing mission to provide you with exceptional heart care, we have created designated Provider Care Teams.  These Care Teams include your primary Cardiologist (physician) and Advanced Practice Providers (APPs -  Physician Assistants and Nurse Practitioners) who all work together to provide you with the care you need, when you need it.  We recommend signing up for the patient portal called "MyChart".  Sign up information is provided on this After Visit Summary.  MyChart is used to connect with patients for Virtual Visits (Telemedicine).  Patients are able to view lab/test results, encounter notes, upcoming appointments, etc.  Non-urgent messages can be sent to your provider as well.   To learn more about what you can do with MyChart, go to [email or web address].    Your next appointment:   3 month(s)  Provider:   Debbe Odea, MD      Signed, Debbe Odea, MD  04/28/2023 9:32 AM    [facility]

## 2023-04-30 ENCOUNTER — Telehealth: Payer: Self-pay | Admitting: Physician Assistant

## 2023-04-30 NOTE — Telephone Encounter (Signed)
 Received disability paperwork from [employer]. Gave to Lauren to complete-Toni

## 2023-05-05 ENCOUNTER — Telehealth: Payer: Self-pay | Admitting: Cardiology

## 2023-05-05 NOTE — Telephone Encounter (Signed)
 Called and spoke with patient. Informed him of the following from [place] site.  Delivered, Gerald [name]  [place], [place] [address]   May 01, 2023, 12:05 pm  Patient verbalizes understanding. Patient states that he will contact the [place] post office.

## 2023-05-05 NOTE — Telephone Encounter (Signed)
 Pt called in for a status on when he will receive heart monitor ordered last week. Please advise.

## 2023-05-09 ENCOUNTER — Other Ambulatory Visit: Payer: Self-pay | Admitting: Physician Assistant

## 2023-05-09 DIAGNOSIS — I10 Essential (primary) hypertension: Secondary | ICD-10-CM

## 2023-05-10 ENCOUNTER — Observation Stay
Admission: EM | Admit: 2023-05-10 | Discharge: 2023-05-11 | Disposition: A | Discharge status: Home Health | Attending: Internal Medicine | Admitting: Internal Medicine

## 2023-05-10 ENCOUNTER — Other Ambulatory Visit: Payer: Self-pay

## 2023-05-10 ENCOUNTER — Observation Stay

## 2023-05-10 ENCOUNTER — Emergency Department

## 2023-05-10 DIAGNOSIS — M542 Cervicalgia: Secondary | ICD-10-CM | POA: Diagnosis not present

## 2023-05-10 DIAGNOSIS — Z7902 Long term (current) use of antithrombotics/antiplatelets: Secondary | ICD-10-CM | POA: Diagnosis not present

## 2023-05-10 DIAGNOSIS — Z6838 Body mass index (BMI) 38.0-38.9, adult: Secondary | ICD-10-CM | POA: Diagnosis not present

## 2023-05-10 DIAGNOSIS — R531 Weakness: Secondary | ICD-10-CM | POA: Diagnosis not present

## 2023-05-10 DIAGNOSIS — Z7982 Long term (current) use of aspirin: Secondary | ICD-10-CM | POA: Insufficient documentation

## 2023-05-10 DIAGNOSIS — Z955 Presence of coronary angioplasty implant and graft: Secondary | ICD-10-CM | POA: Insufficient documentation

## 2023-05-10 DIAGNOSIS — R29898 Other symptoms and signs involving the musculoskeletal system: Principal | ICD-10-CM

## 2023-05-10 DIAGNOSIS — I6381 Other cerebral infarction due to occlusion or stenosis of small artery: Secondary | ICD-10-CM | POA: Diagnosis not present

## 2023-05-10 DIAGNOSIS — I2510 Atherosclerotic heart disease of native coronary artery without angina pectoris: Secondary | ICD-10-CM | POA: Diagnosis not present

## 2023-05-10 DIAGNOSIS — R200 Anesthesia of skin: Secondary | ICD-10-CM

## 2023-05-10 DIAGNOSIS — Z79899 Other long term (current) drug therapy: Secondary | ICD-10-CM | POA: Diagnosis not present

## 2023-05-10 DIAGNOSIS — Z85828 Personal history of other malignant neoplasm of skin: Secondary | ICD-10-CM | POA: Insufficient documentation

## 2023-05-10 DIAGNOSIS — R519 Headache, unspecified: Secondary | ICD-10-CM | POA: Diagnosis not present

## 2023-05-10 DIAGNOSIS — E66812 Obesity, class 2: Secondary | ICD-10-CM | POA: Diagnosis not present

## 2023-05-10 DIAGNOSIS — I639 Cerebral infarction, unspecified: Secondary | ICD-10-CM | POA: Diagnosis not present

## 2023-05-10 DIAGNOSIS — E1165 Type 2 diabetes mellitus with hyperglycemia: Secondary | ICD-10-CM

## 2023-05-10 DIAGNOSIS — Z7722 Contact with and (suspected) exposure to environmental tobacco smoke (acute) (chronic): Secondary | ICD-10-CM | POA: Insufficient documentation

## 2023-05-10 DIAGNOSIS — I10 Essential (primary) hypertension: Secondary | ICD-10-CM | POA: Insufficient documentation

## 2023-05-10 DIAGNOSIS — E119 Type 2 diabetes mellitus without complications: Secondary | ICD-10-CM | POA: Diagnosis not present

## 2023-05-10 LAB — CBG MONITORING, ED: Glucose-Capillary: 146 mg/dL — ABNORMAL HIGH (ref 70–99)

## 2023-05-10 LAB — CBC
HCT: 41.5 % (ref 39.0–52.0)
Hemoglobin: 14.4 g/dL (ref 13.0–17.0)
MCH: 30.3 pg (ref 26.0–34.0)
MCHC: 34.7 g/dL (ref 30.0–36.0)
MCV: 87.2 fL (ref 80.0–100.0)
Platelets: 242 10*3/uL (ref 150–400)
RBC: 4.76 MIL/uL (ref 4.22–5.81)
RDW: 12.8 % (ref 11.5–15.5)
WBC: 8.8 10*3/uL (ref 4.0–10.5)
nRBC: 0 % (ref 0.0–0.2)

## 2023-05-10 LAB — COMPREHENSIVE METABOLIC PANEL WITH GFR
ALT: 36 U/L (ref 0–44)
AST: 22 U/L (ref 15–41)
Albumin: 4 g/dL (ref 3.5–5.0)
Alkaline Phosphatase: 76 U/L (ref 38–126)
Anion gap: 8 (ref 5–15)
BUN: 21 mg/dL — ABNORMAL HIGH (ref 6–20)
CO2: 29 mmol/L (ref 22–32)
Calcium: 9.1 mg/dL (ref 8.9–10.3)
Chloride: 100 mmol/L (ref 98–111)
Creatinine, Ser: 0.99 mg/dL (ref 0.61–1.24)
GFR, Estimated: >60 mL/min (ref >60)
Glucose, Bld: 145 mg/dL — ABNORMAL HIGH (ref 70–99)
Potassium: 3.9 mmol/L (ref 3.5–5.1)
Sodium: 137 mmol/L (ref 135–145)
Total Bilirubin: 0.6 mg/dL (ref 0.0–1.2)
Total Protein: 7.1 g/dL (ref 6.5–8.1)

## 2023-05-10 LAB — PROTIME-INR
INR: 1.1 (ref 0.8–1.2)
Prothrombin Time: 14 s (ref 11.4–15.2)

## 2023-05-10 LAB — DIFFERENTIAL
Abs Immature Granulocytes: 0.08 10*3/uL — ABNORMAL HIGH (ref 0.00–0.07)
Basophils Absolute: 0.1 10*3/uL (ref 0.0–0.1)
Basophils Relative: 1 %
Eosinophils Absolute: 0.1 10*3/uL (ref 0.0–0.5)
Eosinophils Relative: 1 %
Immature Granulocytes: 1 %
Lymphocytes Relative: 36 %
Lymphs Abs: 3.2 10*3/uL (ref 0.7–4.0)
Monocytes Absolute: 0.5 10*3/uL (ref 0.1–1.0)
Monocytes Relative: 5 %
Neutro Abs: 4.9 10*3/uL (ref 1.7–7.7)
Neutrophils Relative %: 56 %

## 2023-05-10 LAB — ETHANOL: Alcohol, Ethyl (B): <10 mg/dL (ref <10)

## 2023-05-10 LAB — APTT: aPTT: 30 s (ref 24–36)

## 2023-05-10 MED ORDER — SODIUM CHLORIDE 0.9% FLUSH: 3.0000 mL | Freq: Once | INTRAVENOUS | Status: DC

## 2023-05-10 MED ORDER — HYDRALAZINE HCL 20 MG/ML IJ SOLN: 5.0000 mg | Freq: Four times a day (QID) | INTRAMUSCULAR | Status: DC | PRN

## 2023-05-10 MED ORDER — GABAPENTIN 300 MG PO CAPS
300.0000 mg | ORAL_CAPSULE | Freq: Three times a day (TID) | ORAL | Status: DC
  Administered 2023-05-10 (×2): 300 mg via ORAL
  Filled 2023-05-10 (×2): qty 1

## 2023-05-10 MED ORDER — SODIUM CHLORIDE 0.9% FLUSH
3.0000 mL | Freq: Two times a day (BID) | INTRAVENOUS | Status: DC
  Administered 2023-05-10: 10 mL via INTRAVENOUS

## 2023-05-10 MED ORDER — ASPIRIN 81 MG PO TBEC
81.0000 mg | DELAYED_RELEASE_TABLET | Freq: Every day | ORAL | Status: DC
  Administered 2023-05-10: 81 mg via ORAL
  Filled 2023-05-10: qty 1

## 2023-05-10 MED ORDER — STROKE: EARLY STAGES OF RECOVERY BOOK: Freq: Once | Status: DC

## 2023-05-10 MED ORDER — ACETAMINOPHEN 160 MG/5ML PO SOLN: 650.0000 mg | ORAL | Status: DC | PRN

## 2023-05-10 MED ORDER — LAMOTRIGINE 25 MG PO TABS
25.0000 mg | ORAL_TABLET | Freq: Two times a day (BID) | ORAL | Status: DC
  Administered 2023-05-10: 25 mg via ORAL
  Filled 2023-05-10: qty 1

## 2023-05-10 MED ORDER — SODIUM CHLORIDE 0.9% FLUSH: 3.0000 mL | INTRAVENOUS | Status: DC | PRN

## 2023-05-10 MED ORDER — ACETAMINOPHEN 325 MG RE SUPP: 650.0000 mg | RECTAL | Status: DC | PRN

## 2023-05-10 MED ORDER — ENOXAPARIN SODIUM 80 MG/0.8ML IJ SOSY
65.0000 mg | PREFILLED_SYRINGE | INTRAMUSCULAR | Status: DC
  Administered 2023-05-10: 65 mg via SUBCUTANEOUS
  Filled 2023-05-10 (×2): qty 0.65

## 2023-05-10 MED ORDER — LORAZEPAM 2 MG/ML IJ SOLN
0.5000 mg | Freq: Four times a day (QID) | INTRAMUSCULAR | Status: DC | PRN
  Administered 2023-05-10: 0.5 mg via INTRAVENOUS
  Filled 2023-05-10: qty 1

## 2023-05-10 MED ORDER — IBUPROFEN 600 MG PO TABS
600.0000 mg | ORAL_TABLET | Freq: Four times a day (QID) | ORAL | Status: DC | PRN
  Administered 2023-05-10: 600 mg via ORAL
  Filled 2023-05-10: qty 1

## 2023-05-10 MED ORDER — ENOXAPARIN SODIUM 40 MG/0.4ML IJ SOSY: 40.0000 mg | PREFILLED_SYRINGE | INTRAMUSCULAR | Status: DC

## 2023-05-10 MED ORDER — MORPHINE SULFATE (PF) 2 MG/ML IV SOLN
2.0000 mg | INTRAVENOUS | Status: DC | PRN
  Administered 2023-05-10 – 2023-05-11 (×2): 2 mg via INTRAVENOUS
  Filled 2023-05-10 (×2): qty 1

## 2023-05-10 MED ORDER — CLOPIDOGREL BISULFATE 75 MG PO TABS
75.0000 mg | ORAL_TABLET | Freq: Every day | ORAL | Status: DC
  Administered 2023-05-10: 75 mg via ORAL
  Filled 2023-05-10: qty 1

## 2023-05-10 MED ORDER — CITALOPRAM HYDROBROMIDE 20 MG PO TABS
40.0000 mg | ORAL_TABLET | Freq: Every day | ORAL | Status: DC
  Administered 2023-05-10: 40 mg via ORAL
  Filled 2023-05-10: qty 2

## 2023-05-10 MED ORDER — ACETAMINOPHEN 325 MG PO TABS
650.0000 mg | ORAL_TABLET | ORAL | Status: DC | PRN
  Administered 2023-05-10 – 2023-05-11 (×2): 650 mg via ORAL
  Filled 2023-05-10 (×2): qty 2

## 2023-05-10 MED ORDER — SENNOSIDES-DOCUSATE SODIUM 8.6-50 MG PO TABS: 1.0000 | ORAL_TABLET | Freq: Every evening | ORAL | Status: DC | PRN

## 2023-05-10 NOTE — ED Provider Notes (Signed)
 [facility] Provider Note    Event Date/Time   First MD Initiated Contact with Patient 05/10/23 1502     (approximate)   History   Numbness   HPI Gerald [name] is a 58 y.o. male with prior history of CVA with left-sided numbness, HTN, HLD, DM2 presenting today for weakness.  Patient reportedly stated that he woke up this morning with a headache and neck pain.  The headache has been present for weeks but appeared worse today.  Around 11 to 11:30 AM he started to have right upper leg numbness which is different than his previous baseline.  Was not aware of any speech changes or vision changes.  Also noting heaviness to his right sided extremities.  Stroke alert was called in triage.  Chart review: Patient had recent hospital admission 1 month ago for CVA symptoms where he received TNK and was in the ICU.     Physical Exam   Triage Vital Signs: ED Triage Vitals [05/10/23 1447]  Encounter Vitals Group     BP 130/86     Systolic BP Percentile      Diastolic BP Percentile      Pulse Rate 80     Resp 20     Temp 98.1 F (36.7 C)     Temp src      SpO2 96 %     Weight      Height      Head Circumference      Peak Flow      Pain Score      Pain Loc      Pain Education      Exclude from Growth Chart     Most recent vital signs: Vitals:   05/10/23 1530 05/10/23 1600  BP: 119/71 136/77  Pulse: 72 69  Resp: 17 (!) 23  Temp:    SpO2: 95% 92%   Physical Exam: I have reviewed the vital signs and nursing notes. General: Awake, alert, no acute distress.  Nontoxic appearing. Head:  Atraumatic, normocephalic.   ENT:  EOM intact, PERRL. Oral mucosa is pink and moist with no lesions. Neck: Neck is supple with full range of motion, No meningeal signs. Cardiovascular:  RRR, No murmurs. Peripheral pulses palpable and equal bilaterally. Respiratory:  Symmetrical chest wall expansion.  No rhonchi, rales, or wheezes.  Good air movement throughout.   No use of accessory muscles.   Musculoskeletal:  No cyanosis or edema. Moving extremities with full ROM Abdomen:  Soft, nontender, nondistended. Neuro:  GCS 15, speech clear.  Cranial nerves II through XII grossly intact.  Drift to left arm and left lower extremity.  Slight drift but does not hit bed with right sided extremities.  Difficulty with heel-to-shin using left leg.  No difficulty with right leg.  Diminished sensation to left side extremities with no change in right side.  Consistent with his baseline. Psych:  Calm, appropriate.   Skin:  Warm, dry, no rash.    ED Results / Procedures / Treatments   Labs (all labs ordered are listed, but only abnormal results are displayed) Labs Reviewed  DIFFERENTIAL - Abnormal; Notable for the following components:      Result Value   Abs Immature Granulocytes 0.08 (*)    All other components within normal limits  COMPREHENSIVE METABOLIC PANEL WITH GFR - Abnormal; Notable for the following components:   Glucose, Bld 145 (*)    BUN 21 (*)    All other components  within normal limits  CBG MONITORING, ED - Abnormal; Notable for the following components:   Glucose-Capillary 146 (*)    All other components within normal limits  PROTIME-INR  APTT  CBC  ETHANOL  CBG MONITORING, ED  I-STAT CREATININE, ED     EKG My EKG interpretation: Rate of 78, sinus rhythm with first-degree AV block.  Normal axis.  No acute ST elevations or depressions   RADIOLOGY Independently interpreted CT head with no acute pathology   PROCEDURES:  Critical Care performed: Yes, see critical care procedure note(s)  .Critical Care  Performed by: Janith Lima, MD Authorized by: Janith Lima, MD   Critical care provider statement:    Critical care time (minutes):  30   Critical care was necessary to treat or prevent imminent or life-threatening deterioration of the following conditions: stroke symptoms.   Critical care was time spent personally by me on  the following activities:  Development of treatment plan with patient or surrogate, discussions with consultants, evaluation of patient's response to treatment, examination of patient, ordering and review of laboratory studies, ordering and review of radiographic studies, ordering and performing treatments and interventions, pulse oximetry, re-evaluation of patient's condition and review of old charts   I assumed direction of critical care for this patient from another provider in my specialty: no     Care discussed with: admitting provider      MEDICATIONS ORDERED IN ED: Medications  sodium chloride flush (NS) 0.9 % injection 3 mL ( Intravenous Canceled Entry 05/10/23 1453)     IMPRESSION / MDM / ASSESSMENT AND PLAN / ED COURSE  I reviewed the triage vital signs and the nursing notes.                              Differential diagnosis includes, but is not limited to, CVA, complex migraine, hemiplegic migraine, psychosocial stressors  Patient's presentation is most consistent with acute presentation with potential threat to life or bodily function.  Patient is a 58 year old male presenting today to the ED for new numbness and weakness.  Has chronic weakness to the left sided extremities but today presented with new right-sided weakness in his leg.  Called a stroke alert in triage.  NIHSS of 3.  CT head showed no acute pathology.  No indication of LVO.  Neurology stated no TNK for patient.  Given ongoing symptoms with new weakness, neurology did recommend admission for MRI brain, PT/OT.  Also requesting psychiatric consultation for possible stress-induced symptoms.  Patient admitted to hospitalist for further care.  The patient is on the cardiac monitor to evaluate for evidence of arrhythmia and/or significant heart rate changes. Clinical Course as of 05/10/23 1605  Sun May 10, 2023  1524 No TNK per neurology at the bedside. Will get MRI [DW]    Clinical Course User Index [DW] Janith Lima, MD     FINAL CLINICAL IMPRESSION(S) / ED DIAGNOSES   Final diagnoses:  Weakness of right lower extremity  Acute nonintractable headache, unspecified headache type     Rx / DC Orders   ED Discharge Orders     None        Note:  This document was prepared using Dragon voice recognition software and may include unintentional dictation errors.   Janith Lima, MD 05/10/23 [phone]

## 2023-05-10 NOTE — Consult Note (Addendum)
 NEUROLOGY CONSULT NOTE   Date of service: May 10, 2023 Patient Name: Gerald [name] MRN:  [id number] DOB:  Dec 01, 1965 Chief Complaint: "Left sided numbness and headache" Requesting Provider: No att. providers found  History of Present Illness  Gerald [name] is a 58 y.o. male with a PMHx of obesity, hepatic steatosis with abnormal LFTs, 2 recent admissions for stroke work up, in March and prior to that in October (seen as a Code Stroke), chronic pain syndrome, depression, GERD, DM2, HLD, HTN, migraines, nonischemic cardiomyopathy, obesity, bilateral carotid artery stenosis, OSA on CPAP and osteoarthritis of bilateral knees who re-presents to the ED today with acute onset of  right lower extremity numbness. He woke up this morning with a headache and neck pain. The headache has been present for weeks but was worse this AM. Ata about 11 to 11:30 AM he started to have right upper leg numbness which felt different than his previous baseline. He was not aware of any speech changes or vision changes. He also noted heaviness to his right upper and lower extremities. Code Stroke was called in triage.   Regarding his headache, it is bifrontal, throbbing and 9/10 currently. No nausea.   Home medications include ASA, Plavix and Lamictal.   MRI brain last admission: No acute infarct, mass effect or extra-axial collection. No acute or chronic hemorrhage. There is multifocal hyperintense T2-weighted signal within the white matter. Parenchymal volume and CSF spaces are normal. Old small vessel infarct of the right thalamus.   CTA of head and neck last admission: . No large vessel occlusion. Similar moderate stenosis of the left PCA origin and right PCA anterior P2 segment. No significant stenosis of the cervical carotid or vertebral arteries. Mild atherosclerosis of the right cavernous ICA without significant stenosis.  TTE from October 2024 admission:  1. Left ventricular ejection fraction,  by estimation, is 50 to 55%. The  left ventricle has low normal function. The left ventricle has no regional  wall motion abnormalities. There is moderate left ventricular hypertrophy.  Left ventricular diastolic parameters are consistent with Grade I diastolic  dysfunction (impaired relaxation).   2. Right ventricular systolic function is normal. The right ventricular  size is normal.   3. The mitral valve is normal in structure. Trivial mitral valve  regurgitation. No evidence of mitral stenosis.   4. The aortic valve is tricuspid. Aortic valve regurgitation is not  visualized. No aortic stenosis is present.   LKW: 1100 Modified rankin score: 0-Completely asymptomatic and back to baseline post- stroke IV Thrombolysis:  No: Symptoms too mild to treat EVT: No: Presentation not consistent with LVO  NIHSS components Score: Comment  1a Level of Conscious 0[x]  1[]  2[]  3[]      1b LOC Questions 0[x]  1[]  2[]       1c LOC Commands 0[x]  1[]  2[]       2 Best Gaze 0[x]  1[]  2[]       3 Visual 0[x]  1[]  2[]  3[]      4 Facial Palsy 0[x]  1[]  2[]  3[]      5a Motor Arm - left 0[x]  1[]  2[]  3[]  4[]  UN[]    Effort dependent, improves with coaching. Giveway weakness noted  5b Motor Arm - Right 0[x]  1[]  2[]  3[]  4[]  UN[]    Effort dependent, improves with coaching.  6a Motor Leg - Left 0[]  1[x]  2[]  3[]  4[]  UN[]    Bobbing drift and effort dependence  6b Motor Leg - Right 0[]  1[x]  2[]  3[]  4[]  UN[]    Bobbing drift and effort dependence  7 Limb Ataxia 0[x]  1[]  2[]  3[]  UN[]     8 Sensory 0[]  1[x]  2[]  UN[]      Patchy bilateral sensory deficits subjectively  9 Best Language 0[x]  1[]  2[]  3[]      10 Dysarthria 0[x]  1[]  2[]  UN[]      11 Extinct. and Inattention 0[x]  1[]  2[]       TOTAL:   3      ROS  Comprehensive ROS performed and pertinent positives documented in HPI    Past History   Past Medical History:  Diagnosis Date   Abnormal LFTs    Anxiety    Atypical chest pain    Borderline diabetes    CAD (coronary  artery disease)    a.) PCI and stent placement (unknown type) to mLAD and mLCx; date unknown. b.) LHC 09/10/2010: 40% mLAD; no intervention. c.) LHC 09/12/2011: EF 60%; 30% ISR mLAD, 40% dLAD, 30% pLCx, 30% ISR mLCx, 40% mRCA; no intervention. d.) LHC 05/23/2014: EF >55%; 30% p-mLAD; no intervention. e.) LHC 06/11/2016: EF 55-65%; LVEDP norm; 30-40% p-mLAD; no intervention.   Carcinoma (HCC)    Chronic pain syndrome    Depression    Fundic gland polyps of stomach, benign    Gastritis    GERD (gastroesophageal reflux disease)    Hepatic steatosis    Hyperlipidemia    Hypertension    Migraines    Myocardial infarction [facility]) 2008   was told by PCP   Nonischemic cardiomyopathy (HCC)    a.) TTE 09/12/2011: EF 35-45%. b.) LHC 09/12/2011: EF 60%. c.) TTE 06/04/2012: EF 55-60%. d.) LHC 05/23/2014: EF >55%. e.) TTE 06/11/2016: EF 55-60%; G1DD. f.) LHC 06/11/2016: EF 55-65%. g.) TTE 01/08/2018: EF 60-65%.   Obesity    Occlusion and stenosis of bilateral carotid arteries    OSA on CPAP    Osteoarthritis of both knees    Shortness of breath    Squamous cell carcinoma of skin 02/27/2020   left distal lat deltoid - [facility]    Past Surgical History:  Procedure Laterality Date   BONE EXCISION Right 11/21/2020   Procedure: PART EXCISION BONE- PHALANX;  Surgeon: Gwyneth Revels, DPM;  Location: [facility];  Service: Podiatry;  Laterality: Right;   CARDIAC CATHETERIZATION N/A 09/2011   [facility]; EF 50% with 30% mid LAD stenosis and no obstructive disease.   CARDIAC CATHETERIZATION  09/2010   [facility]; Mid LAD 40% stenosis; Mid Circumflex:Normal; Mid RCA; Normal   CARDIAC CATHETERIZATION     COLONOSCOPY     COLONOSCOPY WITH PROPOFOL N/A 04/10/2021   Procedure: COLONOSCOPY WITH PROPOFOL;  Surgeon: Wyline Mood, MD;  Location: [facility] ENDOSCOPY;  Service: Gastroenterology;  Laterality: N/A;   ESOPHAGOGASTRODUODENOSCOPY N/A 04/10/2021   Procedure: ESOPHAGOGASTRODUODENOSCOPY (EGD);  Surgeon: Wyline Mood, MD;   Location: [facility] ENDOSCOPY;  Service: Gastroenterology;  Laterality: N/A;   ESOPHAGOGASTRODUODENOSCOPY (EGD) WITH PROPOFOL N/A 06/10/2017   Procedure: ESOPHAGOGASTRODUODENOSCOPY (EGD) WITH PROPOFOL;  Surgeon: Wyline Mood, MD;  Location: [facility] ENDOSCOPY;  Service: Gastroenterology;  Laterality: N/A;   ESOPHAGOGASTRODUODENOSCOPY (EGD) WITH PROPOFOL N/A 01/01/2022   Procedure: ESOPHAGOGASTRODUODENOSCOPY (EGD) WITH PROPOFOL;  Surgeon: Wyline Mood, MD;  Location: [facility] ENDOSCOPY;  Service: Gastroenterology;  Laterality: N/A;   ESOPHAGOGASTRODUODENOSCOPY (EGD) WITH PROPOFOL N/A 01/06/2022   Procedure: ESOPHAGOGASTRODUODENOSCOPY (EGD) WITH PROPOFOL;  Surgeon: Wyline Mood, MD;  Location[facility] END[facility]COPY;  Service: Gastroenterology;  Laterality: N/A;   HAMMER TOE SURGERY Right 11/21/2020   Procedure: HAMMERTOE CORRECTION;  Surgeon: Gwyneth Revels, DPM;  Location: [facility];  Service: Podiatry;  Laterality: Right;  INSERTION OF MESH  04/16/2021   Procedure: INSERTION OF MESH;  Surgeon: Henrene Dodge, MD;  Location: [facility] ORS;  Service: General;;   KNEE ARTHROSCOPY WITH MEDIAL MENISECTOMY Right 09/16/2012   Procedure: RIGHT KNEE ARTHROSCOPY WITH MEDIAL AND LATERAL MENISECTOMY, CHONDROPLASTY;  Surgeon: Loreta Ave, MD;  Location: [facility];  Service: Orthopedics;  Laterality: Right;  RIGHT KNEE SCOPE MEDIAL MENISCECTOMY   LEFT HEART CATH AND CORONARY ANGIOGRAPHY N/A 06/11/2016   Procedure: Left Heart Cath and Coronary Angiography;  Surgeon: Antonieta Iba, MD;  Location: [facility] INVASIVE CV LAB;  Service: Cardiovascular;  Laterality: N/A;   UMBILICAL HERNIA REPAIR N/A 04/16/2021   Procedure: HERNIA REPAIR UMBILICAL ADULT, open;  Surgeon: Henrene Dodge, MD;  Location: [facility];  Service: General;  Laterality: N/A;    Family History: Family History  Problem Relation Age of Onset   Heart disease Father 37       MI   Heart attack Father    Stomach cancer Father    Hypertension Mother     Breast cancer Mother     Social History  reports that he has never smoked. He has been exposed to tobacco smoke. He quit smokeless tobacco use about 38 years ago.  His smokeless tobacco use included chew. He reports that he does not currently use alcohol. He reports that he does not use drugs.  Allergies  Allergen Reactions   Nexlizet [Bempedoic Acid-Ezetimibe] Rash   Statins Hives and Rash    Medications   Current Facility-Administered Medications:    sodium chloride flush (NS) 0.9 % injection 3 mL, 3 mL, Intravenous, Once, Minna Antis, MD  Current Outpatient Medications:    aspirin EC 81 MG tablet, Take 1 tablet (81 mg total) by mouth daily. Swallow whole., Disp: 30 tablet, Rfl: 12   citalopram (CELEXA) 40 MG tablet, TAKE 1 TABLET BY MOUTH EVERY DAY, Disp: 90 tablet, Rfl: 1   clopidogrel (PLAVIX) 75 MG tablet, Take 1 tablet (75 mg total) by mouth daily., Disp: 30 tablet, Rfl: 3   cyclobenzaprine (FLEXERIL) 10 MG tablet, Take 1 tablet (10 mg total) by mouth at bedtime. Take one tab po qhs for back spasm prn only, Disp: 30 tablet, Rfl: 1   Dulaglutide (TRULICITY) 0.75 MG/0.5ML SOAJ, Inject 0.75 mg into the skin once a week., Disp: 2 mL, Rfl: 2   Evolocumab (REPATHA SURECLICK) 140 MG/ML SOAJ, Inject 140 mg into the skin every 14 (fourteen) days., Disp: 2 mL, Rfl: 6   furosemide (LASIX) 20 MG tablet, TAKE 1 TABLET BY MOUTH EVERY DAY AS NEEDED FOR LEG SWELLING, Disp: 90 tablet, Rfl: 1   gabapentin (NEURONTIN) 300 MG capsule, Take 1 capsule (300 mg total) by mouth 2 (two) times daily. (Patient taking differently: Take 300 mg by mouth 3 (three) times daily.), Disp: , Rfl:    lamoTRIgine (LAMICTAL) 25 MG tablet, Take 1 tablet (25 mg total) by mouth at bedtime for 14 days, THEN 1 tablet (25 mg total) 2 (two) times daily for 14 days., Disp: 42 tablet, Rfl: 0   lisinopril-hydrochlorothiazide (ZESTORETIC) 20-25 MG tablet, Take 1 tablet by mouth daily., Disp: , Rfl:    Multiple  Vitamins-Minerals (MULTIVITAMIN ADULTS PO), Take 1 tablet by mouth daily., Disp: , Rfl:    nitroGLYCERIN (NITROSTAT) 0.4 MG SL tablet, Place 1 tablet (0.4 mg total) under the tongue every 5 (five) minutes x 3 doses as needed for chest pain., Disp: 30 tablet, Rfl: 0   rosuvastatin (CRESTOR) 5 MG tablet, Take  1 tablet (5 mg total) by mouth at bedtime., Disp: 30 tablet, Rfl: 3  Vitals   Vitals:   05/10/23 1447  BP: 130/86  Pulse: 80  Resp: 20  Temp: 98.1 F (36.7 C)  SpO2: 96%    There is no height or weight on file to calculate BMI.  Physical Exam   Constitutional: Obese, NAD Psych: Mildly anxious affect with some flattening Eyes: No scleral injection.  HENT: No OP obstruction.  Head: Normocephalic, atraumatic.  Respiratory: Effort normal, non-labored breathing.  Ext: No edema  Neurologic Examination   See NIHSS  Labs/Imaging/Neurodiagnostic studies   CBC: No results for input(s): "WBC", "NEUTROABS", "HGB", "HCT", "MCV", "PLT" in the last 168 hours. Basic Metabolic Panel:  Lab Results  Component Value Date   NA 133 (L) 04/18/2023   K 3.3 (L) 04/18/2023   CO2 26 04/18/2023   GLUCOSE 191 (H) 04/18/2023   BUN 18 04/18/2023   CREATININE 0.82 04/18/2023   CALCIUM 9.0 04/18/2023   GFRNONAA >60 04/18/2023   GFRAA 114 08/02/2019   Lipid Panel:  Lab Results  Component Value Date   LDLCALC 182 (H) 04/17/2023   HgbA1c:  Lab Results  Component Value Date   HGBA1C 9.1 (A) 04/13/2023   Urine Drug Screen:     Component Value Date/Time   LABOPIA NONE DETECTED 04/16/2023 1831   LABOPIA NONE DETECTED 07/17/2017 1839   COCAINSCRNUR NONE DETECTED 04/16/2023 1831   LABBENZ NONE DETECTED 04/16/2023 1831   LABBENZ NONE DETECTED 07/17/2017 1839   AMPHETMU NONE DETECTED 04/16/2023 1831   AMPHETMU NONE DETECTED 07/17/2017 1839   THCU NONE DETECTED 04/16/2023 1831   THCU NONE DETECTED 07/17/2017 1839   LABBARB NONE DETECTED 04/16/2023 1831   LABBARB (A) 07/17/2017 1839     Result not available. Reagent lot number recalled by manufacturer.    Alcohol Level     Component Value Date/Time   ETH <10 04/16/2023 1632   INR  Lab Results  Component Value Date   INR 1.1 04/16/2023   APTT  Lab Results  Component Value Date   APTT 30 04/16/2023     ASSESSMENT  58 y.o. male with a PMHx of obesity, hepatic steatosis with abnormal LFTs, 2 recent admissions for stroke work up, in March and prior to that in October (seen as a Code Stroke), chronic pain syndrome, depression, GERD, DM2, HLD, HTN, migraines, nonischemic cardiomyopathy, obesity, bilateral carotid artery stenosis, OSA on CPAP and osteoarthritis of bilateral knees who re-presents to the ED today with acute onset of  right lower extremity numbness. He woke up this morning with a headache and neck pain. The headache has been present for weeks but was worse this AM. Ata about 11 to 11:30 AM he started to have right upper leg numbness which felt different than his previous baseline. He was not aware of any speech changes or vision changes. He also noted heaviness to his right upper and lower extremities. Code Stroke was called in triage. Regarding his headache, it is bifrontal, throbbing and 9/10 currently. No nausea. Home medications include ASA, Plavix and Lamictal.  - Exam reveals findings suggestive of a functional etiology, but will need to rule out stroke with MRI.  - CT head: No acute intracranial abnormality or significant interval change. Aspects is 10/10. Remote lacunar infarct of the right thalamus. - DDx for presentation includes a tiny lacunar infarction or complicated migraine. Functional, stress-related symptoms are also relatively high on the DDx.   RECOMMENDATIONS  - MRI brain  -  Continue home ASA and Plavix - Continue Lamictal. He has no listed history of seizures. Suspect that this is for his anxiety. Will require further interview with patient regarding the indication for his Lamictal.  -  Psychology consult for possible recent psychosocial stressors - Risk reduction to include weight loss and diet.  - Management of his headache per EDP. Compazine 10 mg IV x 1 may be a good option, given his history of migraines.  - Has had recent CTA and TTE - Cardiac telemetry - Frequent neuro checks - HgbA1c, fasting lipid panel - PT consult, OT consult, Speech consult - Has a statin allergy - NPO until passes stroke swallow screen   ______________________________________________________________________    Dessa Phi, Camdon Saetern, MD [facility]

## 2023-05-10 NOTE — ED Triage Notes (Addendum)
 Pt reports woke up this morning with HA and neck pain. Pt reports HA has been present for weeks. Pt reports 11-11:30am started to have right upper leg numbness. Pt reports hx of 2 strokes with last one in March.Pt reports left sided deficits from prior stroke. No vision changes, no speech changes. Pt reports heaviness in right arm and right leg with mild drift on right side but reports arm and leg feels heavy  EDP consulted. CODE STROKE called at 65

## 2023-05-10 NOTE — ED Notes (Signed)
 Pt still reporting HA p medication administration. Mansy, MD messaged via secure chat.

## 2023-05-10 NOTE — ED Notes (Signed)
 Code stroke called at 2:49

## 2023-05-10 NOTE — Progress Notes (Signed)
 PHARMACIST - PHYSICIAN COMMUNICATION  CONCERNING:  Enoxaparin (Lovenox) for DVT Prophylaxis    RECOMMENDATION: Patient was prescribed enoxaprin 40mg  q24 hours for VTE prophylaxis.   Filed Weights   05/10/23 1524  Weight: 136.1 kg (300 lb)    Body mass index is 38.52 kg/m.  Estimated Creatinine Clearance: 122.3 mL/min (by C-G formula based on SCr of 0.99 mg/dL).   Based on [facility] policy patient is candidate for enoxaparin 0.5mg /kg TBW SQ every 24 hours based on BMI being >30.  DESCRIPTION: Pharmacy has adjusted enoxaparin dose per [facility] policy.  Patient is now receiving enoxaparin 65 mg every 24 hours   Gardner Candle, PharmD, BCPS Clinical Pharmacist 05/10/2023 4:47 PM

## 2023-05-10 NOTE — Consult Note (Signed)
 1450 Code stroke cart activated/ Pt in CT at this time 1454 Dr Otelia Limes Paged [phone] Dr Otelia Limes in CT MRS 0  Dismissed by Dr Otelia Limes while in CT.

## 2023-05-10 NOTE — ED Notes (Signed)
 Pt to MRI at this time.

## 2023-05-10 NOTE — ED Notes (Addendum)
 Pt returns from MRI, pt c/o HA prior to radiology, pt offered prn tylenol but pt stated he would wait until after his MRI. Pt still c/o HA upon return. See MAR.

## 2023-05-10 NOTE — H&P (Signed)
 History and Physical    Gerald [name] ZOX:[id number] DOB: 1966/01/20 DOA: 05/10/2023  PCP: Carlean Jews, PA-C (Confirm with patient/family/NH records and if not entered, this has to be entered at [facility] point of entry) Patient coming from: Home  I have personally briefly reviewed patient's old medical records in [facility] Link  Chief Complaint: Right front thigh numbness  HPI: Gerald [name] is a 58 y.o. male with medical history significant of CVA with residual left-sided weakness ambulation using a cane, CAD status post PCI and stenting on aspirin Plavix, HTN, HLD, morbid obesity, anxiety/depression, presented with new onset of right thigh numbness and weakness.  Symptoms started before lunch around 1130 this morning,, when patient started to feel numbness and weakness of right thigh mainly on the front, persistent.  He was concerned about having another stroke and presented to ED.  He reported that he had hospitalization 2 times recently for weakness and left-sided including 1 episode in March when he received TNK.  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  He denied any suicidal ideation. ED Course: Afebrile, nontachycardic blood pressure 130/80 nonhypoxic.  CT head negative for acute findings.  Blood work showed creatinine 0.9 BUN 21 hemoglobin 14.4, WBC 8.8.  MRI is pending.  Review of Systems: As per HPI otherwise 14 point review of systems negative.    Past Medical History:  Diagnosis Date   Abnormal LFTs    Anxiety    Atypical chest pain    Borderline diabetes    CAD (coronary artery disease)    a.) PCI and stent placement (unknown type) to mLAD and  mLCx; date unknown. b.) LHC 09/10/2010: 40% mLAD; no intervention. c.) LHC 09/12/2011: EF 60%; 30% ISR mLAD, 40% dLAD, 30% pLCx, 30% ISR mLCx, 40% mRCA; no intervention. d.) LHC 05/23/2014: EF >55%; 30% p-mLAD; no intervention. e.) LHC 06/11/2016: EF 55-65%; LVEDP norm; 30-40% p-mLAD; no intervention.   Carcinoma (HCC)    Chronic pain syndrome    Depression    Fundic gland polyps of stomach, benign    Gastritis    GERD (gastroesophageal reflux disease)    Hepatic steatosis    Hyperlipidemia    Hypertension    Migraines    Myocardial infarction [facility]) 2008   was told by PCP   Nonischemic cardiomyopathy (HCC)    a.) TTE 09/12/2011: EF 35-45%. b.) LHC 09/12/2011: EF 60%. c.) TTE 06/04/2012: EF 55-60%. d.) LHC 05/23/2014: EF >55%. e.) TTE 06/11/2016: EF 55-60%; G1DD. f.) LHC 06/11/2016: EF 55-65%. g.) TTE 01/08/2018: EF 60-65%.   Obesity    Occlusion and stenosis of bilateral carotid arteries    OSA on CPAP    Osteoarthritis of both knees    Shortness of breath    Squamous cell carcinoma of skin 02/27/2020   left distal lat deltoid - [facility]    Past Surgical History:  Procedure Laterality Date   BONE EXCISION Right 11/21/2020   Procedure: PART EXCISION BONE- PHALANX;  Surgeon: Gwyneth Revels, DPM;  Location: [facility];  Service: Podiatry;  Laterality: Right;   CARDIAC CATHETERIZATION N/A 09/2011   [facility]; EF 50% with 30% mid LAD stenosis and no obstructive disease.   CARDIAC CATHETERIZATION  09/2010  [facility]; Mid LAD 40% stenosis; Mid Circumflex:Normal; Mid RCA; Normal   CARDIAC CATHETERIZATION     COLONOSCOPY     COLONOSCOPY WITH PROPOFOL N/A 04/10/2021   Procedure: COLONOSCOPY WITH PROPOFOL;  Surgeon: Wyline Mood, MD;  Location: [facility] ENDOSCOPY;  Service: Gastroenterology;  Laterality: N/A;   ESOPHAGOGASTRODUODENOSCOPY N/A 04/10/2021   Procedure: ESOPHAGOGASTRODUODENOSCOPY (EGD);  Surgeon: Wyline Mood, MD;  Location: [facility] ENDOSCOPY;  Service: Gastroenterology;  Laterality: N/A;    ESOPHAGOGASTRODUODENOSCOPY (EGD) WITH PROPOFOL N/A 06/10/2017   Procedure: ESOPHAGOGASTRODUODENOSCOPY (EGD) WITH PROPOFOL;  Surgeon: Wyline Mood, MD;  Location: [facility] ENDOSCOPY;  Service: Gastroenterology;  Laterality: N/A;   ESOPHAGOGASTRODUODENOSCOPY (EGD) WITH PROPOFOL N/A 01/01/2022   Procedure: ESOPHAGOGASTRODUODENOSCOPY (EGD) WITH PROPOFOL;  Surgeon: Wyline Mood, MD;  Location: [facility] ENDOSCOPY;  Service: Gastroenterology;  Laterality: N/A;   ESOPHAGOGASTRODUODENOSCOPY (EGD) WITH PROPOFOL N/A 01/06/2022   Procedure: ESOPHAGOGASTRODUODENOSCOPY (EGD) WITH PROPOFOL;  Surgeon: Wyline Mood, MD;  Location: [facility] ENDOSCOPY;  Service: Gastroenterology;  Laterality: N/A;   HAMMER TOE SURGERY Right 11/21/2020   Procedure: HAMMERTOE CORRECTION;  Surgeon: Gwyneth Revels, DPM;  Location: [facility];  Service: Podiatry;  Laterality: Right;   INSERTION OF MESH  04/16/2021   Procedure: INSERTION OF MESH;  Surgeon: Henrene Dodge, MD;  Location: [facility] ORS;  Service: General;;   KNEE ARTHROSCOPY WITH MEDIAL MENISECTOMY Right 09/16/2012   Procedure: RIGHT KNEE ARTHROSCOPY WITH MEDIAL AND LATERAL MENISECTOMY, CHONDROPLASTY;  Surgeon: Loreta Ave, MD;  Location: [facility];  Service: Orthopedics;  Laterality: Right;  RIGHT KNEE SCOPE MEDIAL MENISCECTOMY   LEFT HEART CATH AND CORONARY ANGIOGRAPHY N/A 06/11/2016   Procedure: Left Heart Cath and Coronary Angiography;  Surgeon: Antonieta Iba, MD;  Location: [facility] INVASIVE CV LAB;  Service: Cardiovascular;  Laterality: N/A;   UMBILICAL HERNIA REPAIR N/A 04/16/2021   Procedure: HERNIA REPAIR UMBILICAL ADULT, open;  Surgeon: Henrene Dodge, MD;  Location: [facility] ORS;  Service: General;  Laterality: N/A;     reports that he has never smoked. He has been exposed to tobacco smoke. He quit smokeless tobacco use about 38 years ago.  His smokeless tobacco use included chew. He reports that he does not currently use alcohol. He reports that he does not use  drugs.  Allergies  Allergen Reactions   Nexlizet [Bempedoic Acid-Ezetimibe] Rash   Statins Hives and Rash    Family History  Problem Relation Age of Onset   Heart disease Father 12       MI   Heart attack Father    Stomach cancer Father    Hypertension Mother    Breast cancer Mother      Prior to Admission medications   Medication Sig Start Date End Date Taking? Authorizing Provider  aspirin EC 81 MG tablet Take 1 tablet (81 mg total) by mouth daily. Swallow whole. 04/19/23   Assaker, West Bali, MD  citalopram (CELEXA) 40 MG tablet TAKE 1 TABLET BY MOUTH EVERY DAY 04/24/23   McDonough, Salomon Fick, PA-C  clopidogrel (PLAVIX) 75 MG tablet Take 1 tablet (75 mg total) by mouth daily. 04/19/23   Assaker, [place] Bali, MD  cyclobenzaprine (FLEXERIL) 10 MG tablet Take 1 tablet (10 mg total) by mouth at bedtime. Take one tab po qhs for back spasm prn only 04/13/23   McDonough, Lauren K, PA-C  Dulaglutide (TRULICITY) 0.75 MG/0.5ML SOAJ Inject 0.75 mg into the skin once a week. 04/24/23   McDonough, Salomon Fick, PA-C  Evolocumab (REPATHA SURECLICK) 140 MG/ML SOAJ Inject 140 mg into the skin every 14 (fourteen) days.  04/28/23   Debbe Odea, MD  furosemide (LASIX) 20 MG tablet TAKE 1 TABLET BY MOUTH EVERY DAY AS NEEDED FOR LEG SWELLING 07/21/22   McDonough, Salomon Fick, PA-C  gabapentin (NEURONTIN) 300 MG capsule Take 1 capsule (300 mg total) by mouth 2 (two) times daily. Patient taking differently: Take 300 mg by mouth 3 (three) times daily. 11/12/22   Lurene Shadow, MD  lamoTRIgine (LAMICTAL) 25 MG tablet Take 1 tablet (25 mg total) by mouth at bedtime for 14 days, THEN 1 tablet (25 mg total) 2 (two) times daily for 14 days. 04/18/23 05/16/23  Assaker, [place] Bali, MD  lisinopril-hydrochlorothiazide (ZESTORETIC) 20-25 MG tablet Take 1 tablet by mouth daily.    [provider]  Multiple Vitamins-Minerals (MULTIVITAMIN ADULTS PO) Take 1 tablet by mouth daily.    [provider]   nitroGLYCERIN (NITROSTAT) 0.4 MG SL tablet Place 1 tablet (0.4 mg total) under the tongue every 5 (five) minutes x 3 doses as needed for chest pain. 04/25/21   Marrion Coy, MD  rosuvastatin (CRESTOR) 5 MG tablet Take 1 tablet (5 mg total) by mouth at bedtime. 04/18/23   Janann Colonel, MD    Physical Exam: Vitals:   05/10/23 1447 05/10/23 1524 05/10/23 1530 05/10/23 1600  BP: 130/86  119/71 136/77  Pulse: 80  72 69  Resp: 20  17 (!) 23  Temp: 98.1 F (36.7 C)     SpO2: 96%  95% 92%  Weight:  136.1 kg    Height:  6\' 2"  (1.88 m)      Constitutional: NAD, calm, comfortable Vitals:   05/10/23 1447 05/10/23 1524 05/10/23 1530 05/10/23 1600  BP: 130/86  119/71 136/77  Pulse: 80  72 69  Resp: 20  17 (!) 23  Temp: 98.1 F (36.7 C)     SpO2: 96%  95% 92%  Weight:  136.1 kg    Height:  6\' 2"  (1.88 m)     Eyes: PERRL, lids and conjunctivae normal ENMT: Mucous membranes are moist. Posterior pharynx clear of any exudate or lesions.Normal dentition.  Neck: normal, supple, no masses, no thyromegaly Respiratory: clear to auscultation bilaterally, no wheezing, no crackles. Normal respiratory effort. No accessory muscle use.  Cardiovascular: Regular rate and rhythm, no murmurs / rubs / gallops. No extremity edema. 2+ pedal pulses. No carotid bruits.  Abdomen: no tenderness, no masses palpated. No hepatosplenomegaly. Bowel sounds positive.  Musculoskeletal: no clubbing / cyanosis. No joint deformity upper and lower extremities. Good ROM, no contractures. Normal muscle tone.  Skin: no rashes, lesions, ulcers. No induration Neurologic: CN 2-12 grossly intact.  Diminished light touch sensation on right front thigh, DTR normal. Strength 4/5 left arm and left leg, muscle strength 5/5 on right upper extremity, 4/5 on right thigh with extension, and 5/5 on other muscle groups on right side leg Psychiatric: Normal judgment and insight. Alert and oriented x 3. Normal mood.     Labs on Admission:  I have personally reviewed following labs and imaging studies  CBC: Recent Labs  Lab 05/10/23 1447  WBC 8.8  NEUTROABS 4.9  HGB 14.4  HCT 41.5  MCV 87.2  PLT 242   Basic Metabolic Panel: Recent Labs  Lab 05/10/23 1447  NA 137  K 3.9  CL 100  CO2 29  GLUCOSE 145*  BUN 21*  CREATININE 0.99  CALCIUM 9.1   GFR: Estimated Creatinine Clearance: 122.3 mL/min (by C-G formula based on SCr of 0.99 mg/dL). Liver Function Tests: Recent Labs  Lab 05/10/23  1447  AST 22  ALT 36  ALKPHOS 76  BILITOT 0.6  PROT 7.1  ALBUMIN 4.0   No results for input(s): "LIPASE", "AMYLASE" in the last 168 hours. No results for input(s): "AMMONIA" in the last 168 hours. Coagulation Profile: Recent Labs  Lab 05/10/23 1447  INR 1.1   Cardiac Enzymes: No results for input(s): "CKTOTAL", "CKMB", "CKMBINDEX", "TROPONINI" in the last 168 hours. BNP (last 3 results) No results for input(s): "PROBNP" in the last 8760 hours. HbA1C: No results for input(s): "HGBA1C" in the last 72 hours. CBG: Recent Labs  Lab 05/10/23 1440  GLUCAP 146*   Lipid Profile: No results for input(s): "CHOL", "HDL", "LDLCALC", "TRIG", "CHOLHDL", "LDLDIRECT" in the last 72 hours. Thyroid Function Tests: No results for input(s): "TSH", "T4TOTAL", "FREET4", "T3FREE", "THYROIDAB" in the last 72 hours. Anemia Panel: No results for input(s): "VITAMINB12", "FOLATE", "FERRITIN", "TIBC", "IRON", "RETICCTPCT" in the last 72 hours. Urine analysis:    Component Value Date/Time   COLORURINE STRAW (A) 04/16/2023 1831   APPEARANCEUR CLEAR (A) 04/16/2023 1831   APPEARANCEUR Turbid (A) 09/01/2022 1105   LABSPEC 1.035 (H) 04/16/2023 1831   LABSPEC 1.018 12/12/2013 1247   PHURINE 5.0 04/16/2023 1831   GLUCOSEU NEGATIVE 04/16/2023 1831   GLUCOSEU Negative 12/12/2013 1247   HGBUR SMALL (A) 04/16/2023 1831   BILIRUBINUR NEGATIVE 04/16/2023 1831   BILIRUBINUR Negative 09/01/2022 1105   BILIRUBINUR Negative 12/12/2013 1247    KETONESUR NEGATIVE 04/16/2023 1831   PROTEINUR NEGATIVE 04/16/2023 1831   UROBILINOGEN 0.2 05/17/2019 0855   UROBILINOGEN 1.0 06/03/2012 1209   NITRITE NEGATIVE 04/16/2023 1831   LEUKOCYTESUR NEGATIVE 04/16/2023 1831   LEUKOCYTESUR Negative 12/12/2013 1247    Radiological Exams on Admission: DG Lumbar Spine 2-3 Views Result Date: 05/10/2023 CLINICAL DATA:  Leg weakness. EXAM: LUMBAR SPINE - 2-3 VIEW COMPARISON:  12/20/2008. FINDINGS: There is no evidence of acute lumbar spine fracture. Disc height loss and endplate osteophytosis of the mid to lower lumbar spine, most pronounced at L5-S1. These findings have progressed since the prior exam. Sacroiliac joints are anatomically aligned. IMPRESSION: 1. No acute osseous abnormality. 2. Multilevel degenerative disc changes of the mid to lower lumbar spine, most pronounced at L5-S1. Electronically Signed   By: Hart Robinsons M.D.   On: 05/10/2023 16:45   CT HEAD CODE STROKE WO CONTRAST Result Date: 05/10/2023 CLINICAL DATA:  Code stroke. Neuro deficit, acute, stroke suspected. Right-sided weakness. Last known well at 11 o'clock a.m. today. EXAM: CT HEAD WITHOUT CONTRAST TECHNIQUE: Contiguous axial images were obtained from the base of the skull through the vertex without intravenous contrast. RADIATION DOSE REDUCTION: This exam was performed according to the departmental dose-optimization program which includes automated exposure control, adjustment of the mA and/or kV according to patient size and/or use of iterative reconstruction technique. COMPARISON:  CT head without contrast 04/16/2023. MR head without contrast 04/17/2023. FINDINGS: Brain: No acute infarct, hemorrhage, or mass lesion is present. No significant white matter lesions are present. Deep brain nuclei are within normal limits. A remote lacunar infarct is again noted in the right thalamus. The ventricles are of normal size. No significant extraaxial fluid collection is present. The brainstem and  cerebellum are within normal limits. Midline structures are within normal limits. Vascular: No hyperdense vessel or unexpected calcification. Skull: Calvarium is intact. No focal lytic or blastic lesions are present. No significant extracranial soft tissue lesion is present. Sinuses/Orbits: The paranasal sinuses and mastoid air cells are clear. The globes and orbits are within normal limits.  ASPECTS [facility] Stroke Program Early CT Score) - Ganglionic level infarction (caudate, lentiform nuclei, internal capsule, insula, M1-M3 cortex): 7/7 - Supraganglionic infarction (M4-M6 cortex): 3/3 Total score (0-10 with 10 being normal): 10/10 IMPRESSION: 1. No acute intracranial abnormality or significant interval change. 2. Aspects is 10/10. 3. Remote lacunar infarct of the right thalamus. The above was relayed via text pager to Dr. Otelia Limes on 05/10/2023 at 15:00 . Electronically Signed   By: Marin Roberts M.D.   On: 05/10/2023 15:01    EKG: Independently reviewed.  Sinus, no acute ST changes.  Assessment/Plan Principal Problem:   Stroke [facility]) Active Problems:   Acute CVA (cerebrovascular accident) (HCC)  (please populate well all problems here in Problem List. (For example, if patient is on BP meds at home and you resume or decide to hold them, it is a problem that needs to be her. Same for CAD, COPD, HLD and so on)  Right lower extremity paresis and paresthesia - Rule out stroke, brain MRI pending - Recent stroke workup including CTA echo was done in March this year, do not repeat those studies. - Allow permissive hypertension, hold off home BP meds and start as needed hydralazine - PT OT evaluation - Other DDx, given the specific dermatome distribution of his symptoms, also suspect lumbar radiculopathy, will send a screening lumbar spine x-ray and outpatient follow-up with PCP/neurosurgery.  Neurology also concerned about issue of possible somatization as patient expressed significant increase of  distress increase job nature change recently.  Psychiatry consulted. UDS - Continue aspirin Plavix, history of poor statin tolerance, on Repatha  History of stroke with residual left-sided paresis - At baseline  HTN - As above  Morbid obesity - BMI= 38,  - Calorie control recommended  Hx of CAD - No acute concern, continue aspirin Plavix   DVT prophylaxis: Lovenox Code Status: Full code Family Communication: None at bedside Disposition Plan: Expect less than 2 midnight hospital stay Consults called: Neurology Admission status: Tele obs   Emeline General MD [facility] Pager [phone]  05/10/2023, 4:53 PM

## 2023-05-11 ENCOUNTER — Other Ambulatory Visit: Payer: Self-pay

## 2023-05-11 ENCOUNTER — Ambulatory Visit: Admitting: Physician Assistant

## 2023-05-11 DIAGNOSIS — E782 Mixed hyperlipidemia: Secondary | ICD-10-CM | POA: Diagnosis not present

## 2023-05-11 DIAGNOSIS — R449 Unspecified symptoms and signs involving general sensations and perceptions: Secondary | ICD-10-CM

## 2023-05-11 DIAGNOSIS — E66812 Obesity, class 2: Secondary | ICD-10-CM | POA: Diagnosis not present

## 2023-05-11 DIAGNOSIS — I639 Cerebral infarction, unspecified: Secondary | ICD-10-CM | POA: Diagnosis not present

## 2023-05-11 DIAGNOSIS — E1165 Type 2 diabetes mellitus with hyperglycemia: Secondary | ICD-10-CM | POA: Diagnosis not present

## 2023-05-11 DIAGNOSIS — Z6838 Body mass index (BMI) 38.0-38.9, adult: Secondary | ICD-10-CM | POA: Diagnosis not present

## 2023-05-11 DIAGNOSIS — I10 Essential (primary) hypertension: Secondary | ICD-10-CM | POA: Diagnosis not present

## 2023-05-11 LAB — CBC
HCT: 38.8 % — ABNORMAL LOW (ref 39.0–52.0)
Hemoglobin: 13.5 g/dL (ref 13.0–17.0)
MCH: 29.9 pg (ref 26.0–34.0)
MCHC: 34.8 g/dL (ref 30.0–36.0)
MCV: 86 fL (ref 80.0–100.0)
Platelets: 219 10*3/uL (ref 150–400)
RBC: 4.51 MIL/uL (ref 4.22–5.81)
RDW: 12.8 % (ref 11.5–15.5)
WBC: 7.8 10*3/uL (ref 4.0–10.5)
nRBC: 0 % (ref 0.0–0.2)

## 2023-05-11 LAB — COMPREHENSIVE METABOLIC PANEL WITH GFR
ALT: 30 U/L (ref 0–44)
AST: 20 U/L (ref 15–41)
Albumin: 3.6 g/dL (ref 3.5–5.0)
Alkaline Phosphatase: 73 U/L (ref 38–126)
Anion gap: 7 (ref 5–15)
BUN: 21 mg/dL — ABNORMAL HIGH (ref 6–20)
CO2: 26 mmol/L (ref 22–32)
Calcium: 8.9 mg/dL (ref 8.9–10.3)
Chloride: 103 mmol/L (ref 98–111)
Creatinine, Ser: 0.92 mg/dL (ref 0.61–1.24)
GFR, Estimated: >60 mL/min (ref >60)
Glucose, Bld: 127 mg/dL — ABNORMAL HIGH (ref 70–99)
Potassium: 3.4 mmol/L — ABNORMAL LOW (ref 3.5–5.1)
Sodium: 136 mmol/L (ref 135–145)
Total Bilirubin: 0.6 mg/dL (ref 0.0–1.2)
Total Protein: 6.7 g/dL (ref 6.5–8.1)

## 2023-05-11 LAB — LIPID PANEL
Cholesterol: 155 mg/dL (ref 0–200)
HDL: 35 mg/dL — ABNORMAL LOW (ref >40)
LDL Cholesterol: 76 mg/dL (ref 0–99)
Total CHOL/HDL Ratio: 4.4 ratio
Triglycerides: 222 mg/dL — ABNORMAL HIGH (ref <150)
VLDL: 44 mg/dL — ABNORMAL HIGH (ref 0–40)

## 2023-05-11 LAB — TROPONIN I (HIGH SENSITIVITY)
Troponin I (High Sensitivity): 5 ng/L (ref <18)
Troponin I (High Sensitivity): 4 ng/L (ref <18)

## 2023-05-11 MED ORDER — NITROGLYCERIN 0.4 MG SL SUBL
0.4000 mg | SUBLINGUAL_TABLET | SUBLINGUAL | Status: DC | PRN
  Administered 2023-05-11 (×2): 0.4 mg via SUBLINGUAL
  Filled 2023-05-11: qty 1

## 2023-05-11 NOTE — Evaluation (Signed)
 Physical Therapy Evaluation Patient Details Name: Gerald [name] MRN: [id number] DOB: September 17, 1965 Today's Date: 05/11/2023  History of Present Illness  Gerald [name] is a 58 y.o. male with medical history significant of CVA with residual left-sided weakness ambulation using a cane, CAD status post PCI and stenting on aspirin Plavix, HTN, HLD, morbid obesity, anxiety/depression, presented with new onset of right thigh numbness and weakness.   Clinical Impression  Pt admitted with above diagnosis. Pt currently with functional limitations due to the deficits listed below (see PT Problem List). Pt received supine in bed agreeable to PT. Reports being out of work since November but has been mod-I using [facility] for household and short community distances. Has chronic L sided weakness from prior CVA's. Wife assists with IADL's and pt has been attending OPPT and OT for his deficits prior to acute admission.   To date, pt is independent with bed mobility and STS transfer. CGA for gait using SPC in RUE ~30' with limited step to pattern, slowed gait speed, and limited stance time on LLE leading to diminished RLE step lengths. Generally appears unsteady. Pt independent with sitting EOB. Pt remains with chronic L sided deficits but per pt with acute RLE weakness and sensation changes. Educated pt on use of SPC to improve sequencing and stability but also benefits of RW temporarily due to unsteadiness and now acute RLE weakness and chronic LLE weakness and benefits of greater stability in general to reduce falls risk. Pt understanding and returns to supine with all needs in reach. PT to plan for return to current therapy recs pt was receiving prior to admission to address acute R sided deficits.      If plan is discharge home, recommend the following: A little help with walking and/or transfers;Assistance with cooking/housework;Assist for transportation;Help with stairs or ramp for entrance   Can travel  by private vehicle        Equipment Recommendations Rolling walker (2 wheels)  Recommendations for Other Services       Functional Status Assessment Patient has had a recent decline in their functional status and demonstrates the ability to make significant improvements in function in a reasonable and predictable amount of time.     Precautions / Restrictions Precautions Precautions: Fall Recall of Precautions/Restrictions: Intact Restrictions Weight Bearing Restrictions Per Provider Order: No      Mobility  Bed Mobility Overal bed mobility: Independent               Patient Response: Cooperative  Transfers Overall transfer level: Independent Equipment used: None                    Ambulation/Gait Ambulation/Gait assistance: Contact guard assist Gait Distance (Feet): 30 Feet Assistive device: Straight cane Gait Pattern/deviations: Step-to pattern, Decreased step length - right, Decreased step length - left, Decreased stance time - right       General Gait Details: VC's needed for correct SPC use and sequencing of limbs with correct LE with fair carryover. Very limited RLE step lengths due to diminished stance time on LLE.  Stairs            Wheelchair Mobility     Tilt Bed Tilt Bed Patient Response: Cooperative  Modified Rankin (Stroke Patients Only)       Balance Overall balance assessment: Needs assistance Sitting-balance support: Feet supported, No upper extremity supported Sitting balance-Leahy Scale: Good       Standing balance-Leahy Scale: Fair Standing balance comment: able to  don belt in standing without UE support                             Pertinent Vitals/Pain Pain Assessment Pain Assessment: Faces Faces Pain Scale: Hurts little more Pain Location: head ache Pain Descriptors / Indicators: Aching Pain Intervention(s): Monitored during session, Patient requesting pain meds-RN notified    Home Living  Family/patient expects to be discharged to:: Private residence Living Arrangements: Spouse/significant other Available Help at Discharge: Family;Available 24 hours/day Type of Home: Apartment         Home Layout: One level Home Equipment: Cane - single point      Prior Function Prior Level of Function : Needs assist       Physical Assist : ADLs (physical)   ADLs (physical): IADLs Mobility Comments: Mod-I with SPC at household and short community distances ADLs Comments: Spouse assists with IADL's     Extremity/Trunk Assessment   Upper Extremity Assessment Upper Extremity Assessment: Defer to OT evaluation    Lower Extremity Assessment Lower Extremity Assessment: Generalized weakness;RLE deficits/detail;LLE deficits/detail RLE Deficits / Details: Grossly 4/5 hip flexion, knee extension, ankle DF (acute) RLE Sensation: decreased light touch LLE Deficits / Details: Grossly 4/5 hip flexion, knee extension, ankle DF (chronic) LLE Sensation: decreased light touch (chronic)       Communication   Communication Communication: No apparent difficulties    Cognition Arousal: Alert Behavior During Therapy: WFL for tasks assessed/performed   PT - Cognitive impairments: No apparent impairments                         Following commands: Intact       Cueing Cueing Techniques: Verbal cues     General Comments General comments (skin integrity, edema, etc.): VSS throughout    Exercises Other Exercises Other Exercises: Educated on use and benefits of RW to improve balance for acutely weak RLE   Assessment/Plan    PT Assessment Patient needs continued PT services  PT Problem List Decreased strength;Pain;Decreased activity tolerance;Decreased balance;Decreased mobility;Decreased safety awareness;Impaired sensation       PT Treatment Interventions DME instruction;Balance training;Gait training;Neuromuscular re-education;Functional mobility  training;Patient/family education;Therapeutic activities;Therapeutic exercise    PT Goals (Current goals can be found in the Care Plan section)  Acute Rehab PT Goals Patient Stated Goal: improve strength PT Goal Formulation: With patient Time For Goal Achievement: 05/25/23 Potential to Achieve Goals: Good    Frequency 7X/week     Co-evaluation               AM-PAC PT "6 Clicks" Mobility  Outcome Measure Help needed turning from your back to your side while in a flat bed without using bedrails?: None Help needed moving from lying on your back to sitting on the side of a flat bed without using bedrails?: None Help needed moving to and from a bed to a chair (including a wheelchair)?: A Little Help needed standing up from a chair using your arms (e.g., wheelchair or bedside chair)?: A Little Help needed to walk in hospital room?: A Little Help needed climbing 3-5 steps with a railing? : A Lot 6 Click Score: 19    End of Session Equipment Utilized During Treatment: Gait belt Activity Tolerance: Patient limited by fatigue Patient left: in bed;with call bell/phone within reach Nurse Communication: Mobility status PT Visit Diagnosis: Other abnormalities of gait and mobility (R26.89);Muscle weakness (generalized) (M62.81)    Time:  1610-9604 PT Time Calculation (min) (ACUTE ONLY): 24 min   Charges:   PT Evaluation $PT Eval Moderate Complexity: 1 Mod PT Treatments $Gait Training: 8-22 mins PT General Charges $$ ACUTE PT VISIT: 1 Visit        Delphia Grates. Fairly IV, PT, DPT Physical Therapist- [place]  [facility]  05/11/2023, 10:01 AM

## 2023-05-11 NOTE — ED Notes (Signed)
 Pt called out reporting chest pressure that woke him from sleep around 10-15 minutes ago and also reports headache is back to 7.5/10. pt states he was able to rest for a while after receiving morphine but was woken from sleep. EKG performed and signed by York Cerise, MD; green top collected and sent to lab; and pt given prn tylenol. Mansy, MD notified.

## 2023-05-11 NOTE — Telephone Encounter (Signed)
 Please review

## 2023-05-11 NOTE — Discharge Summary (Signed)
 Physician Discharge Summary   Patient: Gerald [name] MRN: [id number] DOB: 30-Apr-1965  Admit date:     05/10/2023  Discharge date: {dischdate:26783}  Discharge Physician: Marcelino Duster   PCP: Carlean Jews, PA-C   Recommendations at discharge:  {Tip this will not be part of the note when signed- Example include specific recommendations for outpatient follow-up, pending tests to follow-up on. (Optional):26781}  ***  Discharge Diagnoses: Principal Problem:   Stroke [facility]) Active Problems:   Acute CVA (cerebrovascular accident) (HCC)  Resolved Problems:   * No resolved hospital problems. [facility] Course: No notes on file  Assessment and Plan: No notes have been filed under this hospital service. Service: Hospitalist     {Tip this will not be part of the note when signed Body mass index is 38.52 kg/m. , ,  (Optional):26781}  {(NOTE) Pain control PDMP Statment (Optional):26782} Consultants: *** Procedures performed: ***  Disposition: {Plan; Disposition:26390} Diet recommendation:  Discharge Diet Orders (From admission, onward)     Start     Ordered   05/11/23 0000  Diet - low sodium heart healthy        05/11/23 1013   05/11/23 0000  Diet Carb Modified        05/11/23 1013           {Diet_Plan:26776} DISCHARGE MEDICATION: Allergies as of 05/11/2023       Reactions   Nexlizet [bempedoic Acid-ezetimibe] Rash   Statins Hives, Rash   For high dose statin        Medication List     STOP taking these medications    Trulicity 0.75 MG/0.5ML Soaj Generic drug: Dulaglutide       TAKE these medications    aspirin EC 81 MG tablet Take 1 tablet (81 mg total) by mouth daily. Swallow whole.   citalopram 40 MG tablet Commonly known as: CELEXA TAKE 1 TABLET BY MOUTH EVERY DAY   clopidogrel 75 MG tablet Commonly known as: PLAVIX Take 1 tablet (75 mg total) by mouth daily.   cyclobenzaprine 10 MG tablet Commonly known as:  FLEXERIL Take 1 tablet (10 mg total) by mouth at bedtime. Take one tab po qhs for back spasm prn only   furosemide 20 MG tablet Commonly known as: LASIX TAKE 1 TABLET BY MOUTH EVERY DAY AS NEEDED FOR LEG SWELLING   gabapentin 300 MG capsule Commonly known as: NEURONTIN Take 1 capsule (300 mg total) by mouth 2 (two) times daily. What changed: when to take this   lamoTRIgine 25 MG tablet Commonly known as: LAMICTAL Take 1 tablet (25 mg total) by mouth at bedtime for 14 days, THEN 1 tablet (25 mg total) 2 (two) times daily for 14 days. Start taking on: April 18, 2023   lisinopril-hydrochlorothiazide 20-25 MG tablet Commonly known as: ZESTORETIC Take 1 tablet by mouth daily.   MULTIVITAMIN ADULTS PO Take 1 tablet by mouth daily.   nitroGLYCERIN 0.4 MG SL tablet Commonly known as: NITROSTAT Place 1 tablet (0.4 mg total) under the tongue every 5 (five) minutes x 3 doses as needed for chest pain.   nortriptyline 10 MG capsule Commonly known as: PAMELOR Take 10 mg by mouth at bedtime.   Repatha SureClick 140 MG/ML Soaj Generic drug: Evolocumab Inject 140 mg into the skin every 14 (fourteen) days.   rosuvastatin 5 MG tablet Commonly known as: CRESTOR Take 1 tablet (5 mg total) by mouth at bedtime.        Follow-up Information  McDonough, Lauren K, PA-C Follow up in 1 week(s).   Specialty: Physician Assistant Contact information: [phone] [address] [place] [address] [phone]                Discharge Exam: Ceasar Mons Weights   05/10/23 1524  Weight: 136.1 kg   ***  Condition at discharge: {DC Condition:26389}  The results of significant diagnostics from this hospitalization (including imaging, microbiology, ancillary and laboratory) are listed below for reference.   Imaging Studies: MR BRAIN WO CONTRAST Result Date: 05/10/2023 CLINICAL DATA:  Right lower extremity weakness EXAM: MRI HEAD WITHOUT CONTRAST TECHNIQUE: Multiplanar, multiecho pulse sequences  of the brain and surrounding structures were obtained without intravenous contrast. COMPARISON:  None Available. FINDINGS: Brain: No acute infarct, mass effect or extra-axial collection. No acute or chronic hemorrhage. There is multifocal hyperintense T2-weighted signal within the white matter. Parenchymal volume and CSF spaces are normal. The midline structures are normal. Vascular: Normal flow voids. Skull and upper cervical spine: Normal calvarium and skull base. Visualized upper cervical spine and soft tissues are normal. Sinuses/Orbits:No paranasal sinus fluid levels or advanced mucosal thickening. No mastoid or middle ear effusion. Normal orbits. IMPRESSION: Normal brain MRI. Electronically Signed   By: Deatra Robinson M.D.   On: 05/10/2023 19:51   DG Lumbar Spine 2-3 Views Result Date: 05/10/2023 CLINICAL DATA:  Leg weakness. EXAM: LUMBAR SPINE - 2-3 VIEW COMPARISON:  12/20/2008. FINDINGS: There is no evidence of acute lumbar spine fracture. Disc height loss and endplate osteophytosis of the mid to lower lumbar spine, most pronounced at L5-S1. These findings have progressed since the prior exam. Sacroiliac joints are anatomically aligned. IMPRESSION: 1. No acute osseous abnormality. 2. Multilevel degenerative disc changes of the mid to lower lumbar spine, most pronounced at L5-S1. Electronically Signed   By: Hart Robinsons M.D.   On: 05/10/2023 16:45   CT HEAD CODE STROKE WO CONTRAST Result Date: 05/10/2023 CLINICAL DATA:  Code stroke. Neuro deficit, acute, stroke suspected. Right-sided weakness. Last known well at 11 o'clock a.m. today. EXAM: CT HEAD WITHOUT CONTRAST TECHNIQUE: Contiguous axial images were obtained from the base of the skull through the vertex without intravenous contrast. RADIATION DOSE REDUCTION: This exam was performed according to the departmental dose-optimization program which includes automated exposure control, adjustment of the mA and/or kV according to patient size and/or use  of iterative reconstruction technique. COMPARISON:  CT head without contrast 04/16/2023. MR head without contrast 04/17/2023. FINDINGS: Brain: No acute infarct, hemorrhage, or mass lesion is present. No significant white matter lesions are present. Deep brain nuclei are within normal limits. A remote lacunar infarct is again noted in the right thalamus. The ventricles are of normal size. No significant extraaxial fluid collection is present. The brainstem and cerebellum are within normal limits. Midline structures are within normal limits. Vascular: No hyperdense vessel or unexpected calcification. Skull: Calvarium is intact. No focal lytic or blastic lesions are present. No significant extracranial soft tissue lesion is present. Sinuses/Orbits: The paranasal sinuses and mastoid air cells are clear. The globes and orbits are within normal limits. ASPECTS [facility] Stroke Program Early CT Score) - Ganglionic level infarction (caudate, lentiform nuclei, internal capsule, insula, M1-M3 cortex): 7/7 - Supraganglionic infarction (M4-M6 cortex): 3/3 Total score (0-10 with 10 being normal): 10/10 IMPRESSION: 1. No acute intracranial abnormality or significant interval change. 2. Aspects is 10/10. 3. Remote lacunar infarct of the right thalamus. The above was relayed via text pager to Dr. Otelia Limes on 05/10/2023 at 15:00 . Electronically Signed  By: Marin Roberts M.D.   On: 05/10/2023 15:01   MR BRAIN WO CONTRAST Result Date: 04/17/2023 CLINICAL DATA:  Stroke follow-up EXAM: MRI HEAD WITHOUT CONTRAST TECHNIQUE: Multiplanar, multiecho pulse sequences of the brain and surrounding structures were obtained without intravenous contrast. COMPARISON:  None Available. FINDINGS: Brain: No acute infarct, mass effect or extra-axial collection. No acute or chronic hemorrhage. There is multifocal hyperintense T2-weighted signal within the white matter. Parenchymal volume and CSF spaces are normal. Old small vessel infarct of the  right thalamus. The midline structures are normal. Vascular: Normal flow voids. Skull and upper cervical spine: Normal calvarium and skull base. Visualized upper cervical spine and soft tissues are normal. Sinuses/Orbits:No paranasal sinus fluid levels or advanced mucosal thickening. No mastoid or middle ear effusion. Normal orbits. IMPRESSION: 1. No acute intracranial abnormality. 2. Old thalamic small vessel infarct and mild findings of chronic small vessel ischemia. Electronically Signed   By: Deatra Robinson M.D.   On: 04/17/2023 19:10   CT HEAD WO CONTRAST ( ) Result Date: 04/16/2023 CLINICAL DATA:  Stroke follow-up, now with right-sided weakness. EXAM: CT HEAD WITHOUT CONTRAST TECHNIQUE: Contiguous axial images were obtained from the base of the skull through the vertex without intravenous contrast. RADIATION DOSE REDUCTION: This exam was performed according to the departmental dose-optimization program which includes automated exposure control, adjustment of the mA and/or kV according to patient size and/or use of iterative reconstruction technique. COMPARISON:  Same-day head CT and CTA head and neck. FINDINGS: Brain: No acute intracranial hemorrhage. No CT evidence of acute infarct. Nonspecific hypoattenuation in the periventricular and subcortical white matter favored to reflect chronic microvascular ischemic changes. Remote lacunar infarcts again noted. No edema, mass effect, or midline shift. The basilar cisterns are patent. Ventricles: The ventricles are normal. Vascular: No hyperdense vessel or unexpected calcification. Skull: No acute or aggressive finding. Orbits: Orbits are symmetric. Sinuses: Mild mucosal thickening in the ethmoid sinuses. Other: Mastoid air cells are clear. IMPRESSION: 1. No CT evidence of acute intracranial abnormality. If further clinical concern, recommend MRI for further evaluation. Electronically Signed   By: Emily Filbert M.D.   On: 04/16/2023 20:51   CT ANGIO HEAD NECK  W WO CM (CODE STROKE) Result Date: 04/16/2023 CLINICAL DATA:  Code stroke, neuro deficit EXAM: CT ANGIOGRAPHY HEAD AND NECK WITH AND WITHOUT CONTRAST TECHNIQUE: Multidetector CT imaging of the head and neck was performed using the standard protocol during bolus administration of intravenous contrast. Multiplanar CT image reconstructions and MIPs were obtained to evaluate the vascular anatomy. Carotid stenosis measurements (when applicable) are obtained utilizing NASCET criteria, using the distal internal carotid diameter as the denominator. RADIATION DOSE REDUCTION: This exam was performed according to the departmental dose-optimization program which includes automated exposure control, adjustment of the mA and/or kV according to patient size and/or use of iterative reconstruction technique. CONTRAST:  75mL OMNIPAQUE IOHEXOL 350 MG/ML SOLN COMPARISON:  Same-day head CT.  CTA head and neck 11/10/2022. FINDINGS: CTA NECK FINDINGS Aortic arch: Standard configuration of the aortic arch. Imaged portion shows no evidence of aneurysm or dissection. No significant stenosis of the major arch vessel origins. Pulmonary arteries: As permitted by contrast timing, there are no filling defects in the visualized pulmonary arteries. Subclavian arteries: The subclavian arteries are patent bilaterally. Right carotid system: No evidence of dissection, stenosis (50% or greater), or occlusion. Minimal atherosclerosis at the carotid bifurcation. Left carotid system: No evidence of dissection, stenosis (50% or greater), or occlusion. Vertebral arteries: Codominant. No evidence of dissection,  stenosis (50% or greater), or occlusion. Minimal atherosclerosis at the origin of the right vertebral artery without stenosis. Skeleton: No acute findings. Degenerative changes in the cervical spine. Other neck: The visualized airway is patent. No cervical lymphadenopathy. Upper chest: Visualized lung apices are clear. Review of the MIP images  confirms the above findings CTA HEAD FINDINGS ANTERIOR CIRCULATION: The intracranial ICAs are patent bilaterally. Mild atherosclerosis of the right cavernous ICA without significant stenosis no significant stenosis, proximal occlusion, aneurysm, or vascular malformation. MCAs: The middle cerebral arteries are patent bilaterally. ACAs: The anterior cerebral arteries are patent bilaterally. POSTERIOR CIRCULATION: No significant stenosis, proximal occlusion, aneurysm, or vascular malformation. PCAs: Patent bilaterally. Similar moderate stenosis of the left PCA origin. Additional focal moderate stenosis of the anterior P2 segment of the right PCA again noted. Pcomm: Small posterior communicating artery visualized on the left. SCAs: The superior cerebellar arteries are patent bilaterally. Basilar artery: Patent AICAs: Visualized on the left. PICAs: Visualized bilaterally. Vertebral arteries: The intracranial vertebral arteries are patent. Venous sinuses: As permitted by contrast timing, patent. Anatomic variants: None Review of the MIP images confirms the above findings IMPRESSION: 1. No large vessel occlusion. 2. Similar moderate stenosis of the left PCA origin and right PCA anterior P2 segment. 3. No significant stenosis of the cervical carotid or vertebral arteries. 4. Mild atherosclerosis of the right cavernous ICA without significant stenosis. These results were communicated to Dr. Pearlean Brownie At 5:07 pm on 04/16/2023 by text page via the [facility] messaging system. Electronically Signed   By: Emily Filbert M.D.   On: 04/16/2023 17:07   CT HEAD CODE STROKE WO CONTRAST Result Date: 04/16/2023 CLINICAL DATA:  Code stroke. Neuro deficit, last known well at 3:35 p.m. EXAM: CT HEAD WITHOUT CONTRAST TECHNIQUE: Contiguous axial images were obtained from the base of the skull through the vertex without intravenous contrast. RADIATION DOSE REDUCTION: This exam was performed according to the departmental dose-optimization program  which includes automated exposure control, adjustment of the mA and/or kV according to patient size and/or use of iterative reconstruction technique. COMPARISON:  MRI head 11/11/2022, CT head 11/10/2022. FINDINGS: Brain: No acute intracranial hemorrhage. No CT evidence of acute infarct. Nonspecific hypoattenuation in the periventricular and subcortical white matter favored to reflect chronic microvascular ischemic changes. Remote lacunar infarcts in the right thalamus and right basal ganglia. No edema, mass effect, or midline shift. The basilar cisterns are patent. Ventricles: The ventricles are normal. Vascular: No hyperdense vessel or unexpected calcification. Skull: No acute or aggressive finding. Orbits: Orbits are symmetric. Sinuses: The visualized paranasal sinuses are clear. Other: Mastoid air cells are clear. ASPECTS [facility] Stroke Program Early CT Score) - Ganglionic level infarction (caudate, lentiform nuclei, internal capsule, insula, M1-M3 cortex): 7 - Supraganglionic infarction (M4-M6 cortex): 3 Total score (0-10 with 10 being normal): IMPRESSION: 1. No CT evidence of acute intracranial abnormality. 2. Mild chronic microvascular ischemic changes. Small remote lacunar infarcts in the right thalamus and right basal ganglia. 3. ASPECTS is 10 These results were communicated to Dr. Pearlean Brownie At 4:44 pm on 04/16/2023 by text page via the [facility] messaging system. Electronically Signed   By: Emily Filbert M.D.   On: 04/16/2023 16:45    Microbiology: Results for orders placed or performed during the hospital encounter of 04/16/23  MRSA Next Gen by PCR, Nasal     Status: None   Collection Time: 04/16/23  6:11 PM   Specimen: Nasal Mucosa; Nasal Swab  Result Value Ref Range Status   MRSA by PCR  Next Gen NOT DETECTED NOT DETECTED Final    Comment: (NOTE) The GeneXpert MRSA Assay (FDA approved for NASAL specimens only), is one component of a comprehensive MRSA colonization surveillance program. It is not  intended to diagnose MRSA infection nor to guide or monitor treatment for MRSA infections. Test performance is not FDA approved in patients less than 39 years old. Performed at [facility], [address] [place], [place] [address]     Labs: CBC: Recent Labs  Lab 05/10/23 1447 05/11/23 0150  WBC 8.8 7.8  NEUTROABS 4.9  --   HGB 14.4 13.5  HCT 41.5 38.8*  MCV 87.2 86.0  PLT 242 219   Basic Metabolic Panel: Recent Labs  Lab 05/10/23 1447 05/11/23 0150  NA 137 136  K 3.9 3.4*  CL 100 103  CO2 29 26  GLUCOSE 145* 127*  BUN 21* 21*  CREATININE 0.99 0.92  CALCIUM 9.1 8.9   Liver Function Tests: Recent Labs  Lab 05/10/23 1447 05/11/23 0150  AST 22 20  ALT 36 30  ALKPHOS 76 73  BILITOT 0.6 0.6  PROT 7.1 6.7  ALBUMIN 4.0 3.6   CBG: Recent Labs  Lab 05/10/23 1440  GLUCAP 146*    Discharge time spent: {LESS THAN/GREATER THAN:26388} 30 minutes.  Signed: Marcelino Duster, MD [facility] 05/11/2023

## 2023-05-11 NOTE — ED Notes (Signed)
 Pt called out reporting an alarm was going off and his headache started about 20 minutes ago 9/10. See mar.

## 2023-05-11 NOTE — Evaluation (Signed)
 Occupational Therapy Evaluation Patient Details Name: Gerald [name] MRN: [id number] DOB: 1965-09-03 Today's Date: 05/11/2023   History of Present Illness   Gerald [name] is a 58 y.o. male with medical history significant of CVA with residual left-sided weakness ambulation using a cane, CAD status post PCI and stenting on aspirin Plavix, HTN, HLD, morbid obesity, anxiety/depression, presented with new onset of right thigh numbness and weakness. MRI negative   Clinical Impressions Gerald [name] was seen for OT evaluation this date. Prior to hospital admission, pt was MOD I using SPC limited distances and assist for IADLs. Pt lives with spouse in apartment. Pt currently requires SUPERVISION don belt in standing and increased time. CGA + SPC for ADL t/f ~20 ft. RUE grossly 3+/5, increased time for [facility]. Pt would benefit from skilled OT to address noted impairments and functional limitations (see below for any additional details). Upon hospital discharge, recommend return to Outpatient OT.   If plan is discharge home, recommend the following:   Help with stairs or ramp for entrance     Functional Status Assessment   Patient has had a recent decline in their functional status and demonstrates the ability to make significant improvements in function in a reasonable and predictable amount of time.     Equipment Recommendations   Other (comment) (RW)     Recommendations for Other Services         Precautions/Restrictions   Precautions Precautions: Fall Recall of Precautions/Restrictions: Intact Restrictions Weight Bearing Restrictions Per Provider Order: No     Mobility Bed Mobility Overal bed mobility: Independent                  Transfers Overall transfer level: Needs assistance Equipment used: None Transfers: Sit to/from Stand Sit to Stand: Supervision                  Balance Overall balance assessment: Needs assistance Sitting-balance  support: Feet supported, No upper extremity supported Sitting balance-Leahy Scale: Good     Standing balance support: No upper extremity supported, During functional activity Standing balance-Leahy Scale: Fair                             ADL either performed or assessed with clinical judgement   ADL Overall ADL's : Needs assistance/impaired                                       General ADL Comments: SUPERVISION don belt in standing and increased time. CGA + SPC for ADL t/f ~20 ft      Pertinent Vitals/Pain Pain Assessment Pain Assessment: 0-10 Pain Score: 4  Pain Location: head ache Pain Descriptors / Indicators: Aching Pain Intervention(s): Limited activity within patient's tolerance, Patient requesting pain meds-RN notified     Extremity/Trunk Assessment Upper Extremity Assessment Upper Extremity Assessment: RUE deficits/detail;LUE deficits/detail RUE Deficits / Details: 3/5 grip, increased time for digit opposition. 3+/5 RUE grossly LUE Deficits / Details: 3-/5 grip, 3/5 LUE grossly   Lower Extremity Assessment Lower Extremity Assessment: Generalized weakness RLE Deficits / Details: Grossly 4/5 hip flexion, knee extension, ankle DF (acute) RLE Sensation: decreased light touch LLE Deficits / Details: Grossly 4/5 hip flexion, knee extension, ankle DF (chronic) LLE Sensation: decreased light touch (chronic)       Communication Communication Communication: No apparent difficulties   Cognition Arousal: Alert  Behavior During Therapy: WFL for tasks assessed/performed Cognition: No apparent impairments                               Following commands: Intact       Cueing  General Comments   Cueing Techniques: Verbal cues  VSS throughout   Exercises     Shoulder Instructions      Home Living Family/patient expects to be discharged to:: Private residence Living Arrangements: Spouse/significant other Available Help  at Discharge: Family;Available 24 hours/day Type of Home: Apartment       Home Layout: One level     Bathroom Shower/Tub: Chief Strategy Officer: Handicapped height     Home Equipment: Cane - single point          Prior Functioning/Environment Prior Level of Function : Needs assist       Physical Assist : ADLs (physical)   ADLs (physical): IADLs Mobility Comments: Mod-I with SPC at household and short community distances ADLs Comments: Spouse assists with IADL's    OT Problem List: Decreased strength;Decreased activity tolerance;Impaired balance (sitting and/or standing)   OT Treatment/Interventions: Self-care/ADL training;Therapeutic exercise;Energy conservation;DME and/or AE instruction;Neuromuscular education;Therapeutic activities      OT Goals(Current goals can be found in the care plan section)   Acute Rehab OT Goals Patient Stated Goal: to go home OT Goal Formulation: With patient Time For Goal Achievement: 05/25/23 Potential to Achieve Goals: Good ADL Goals Pt Will Perform Grooming: standing;with modified independence Pt Will Perform Lower Body Dressing: with modified independence;sit to/from stand Pt Will Transfer to Toilet: with modified independence;ambulating;regular height toilet   OT Frequency:  Min 3X/week    Co-evaluation              AM-PAC OT "6 Clicks" Daily Activity     Outcome Measure Help from another person eating meals?: None Help from another person taking care of personal grooming?: A Little Help from another person toileting, which includes using toliet, bedpan, or urinal?: A Little Help from another person bathing (including washing, rinsing, drying)?: A Little Help from another person to put on and taking off regular upper body clothing?: None Help from another person to put on and taking off regular lower body clothing?: A Little 6 Click Score: 20   End of Session Nurse Communication: Patient requests pain  meds  Activity Tolerance: Patient tolerated treatment well Patient left: in bed;with call bell/phone within reach  OT Visit Diagnosis: Unsteadiness on feet (R26.81)                Time: 2956-2130 OT Time Calculation (min): 13 min Charges:  OT General Charges $OT Visit: 1 Visit OT Evaluation $OT Eval Low Complexity: 1 Low  Kathie Dike, M.S. OTR/L  05/11/23, 10:41 AM  ascom [phone]

## 2023-05-12 DIAGNOSIS — M79605 Pain in left leg: Secondary | ICD-10-CM | POA: Diagnosis not present

## 2023-05-12 DIAGNOSIS — M79602 Pain in left arm: Secondary | ICD-10-CM | POA: Diagnosis not present

## 2023-05-12 DIAGNOSIS — M6281 Muscle weakness (generalized): Secondary | ICD-10-CM | POA: Diagnosis not present

## 2023-05-14 DIAGNOSIS — M79602 Pain in left arm: Secondary | ICD-10-CM | POA: Diagnosis not present

## 2023-05-14 DIAGNOSIS — M79605 Pain in left leg: Secondary | ICD-10-CM | POA: Diagnosis not present

## 2023-05-14 DIAGNOSIS — M6281 Muscle weakness (generalized): Secondary | ICD-10-CM | POA: Diagnosis not present

## 2023-05-15 ENCOUNTER — Other Ambulatory Visit: Payer: Self-pay

## 2023-05-15 ENCOUNTER — Emergency Department
Admission: EM | Admit: 2023-05-15 | Discharge: 2023-05-16 | Disposition: A | Discharge status: Home / Self Care | Attending: Emergency Medicine | Admitting: Emergency Medicine

## 2023-05-15 ENCOUNTER — Emergency Department

## 2023-05-15 DIAGNOSIS — Z7901 Long term (current) use of anticoagulants: Secondary | ICD-10-CM | POA: Insufficient documentation

## 2023-05-15 DIAGNOSIS — R079 Chest pain, unspecified: Secondary | ICD-10-CM | POA: Insufficient documentation

## 2023-05-15 DIAGNOSIS — R0789 Other chest pain: Secondary | ICD-10-CM | POA: Diagnosis not present

## 2023-05-15 DIAGNOSIS — R0602 Shortness of breath: Secondary | ICD-10-CM | POA: Insufficient documentation

## 2023-05-15 LAB — BASIC METABOLIC PANEL WITH GFR
Anion gap: 11 (ref 5–15)
BUN: 13 mg/dL (ref 6–20)
CO2: 25 mmol/L (ref 22–32)
Calcium: 9.5 mg/dL (ref 8.9–10.3)
Chloride: 98 mmol/L (ref 98–111)
Creatinine, Ser: 0.79 mg/dL (ref 0.61–1.24)
GFR, Estimated: >60 mL/min (ref >60)
Glucose, Bld: 147 mg/dL — ABNORMAL HIGH (ref 70–99)
Potassium: 3.5 mmol/L (ref 3.5–5.1)
Sodium: 134 mmol/L — ABNORMAL LOW (ref 135–145)

## 2023-05-15 LAB — CBC
HCT: 40.5 % (ref 39.0–52.0)
Hemoglobin: 14.1 g/dL (ref 13.0–17.0)
MCH: 29.7 pg (ref 26.0–34.0)
MCHC: 34.8 g/dL (ref 30.0–36.0)
MCV: 85.3 fL (ref 80.0–100.0)
Platelets: 229 10*3/uL (ref 150–400)
RBC: 4.75 MIL/uL (ref 4.22–5.81)
RDW: 12.9 % (ref 11.5–15.5)
WBC: 11.8 10*3/uL — ABNORMAL HIGH (ref 4.0–10.5)
nRBC: 0 % (ref 0.0–0.2)

## 2023-05-15 LAB — TROPONIN I (HIGH SENSITIVITY)
Troponin I (High Sensitivity): 4 ng/L (ref <18)
Troponin I (High Sensitivity): 4 ng/L

## 2023-05-15 LAB — D-DIMER, QUANTITATIVE: D-Dimer, Quant: 0.29 ug{FEU}/mL (ref 0.00–0.50)

## 2023-05-15 LAB — BRAIN NATRIURETIC PEPTIDE: B Natriuretic Peptide: 20.4 pg/mL (ref 0.0–100.0)

## 2023-05-15 MED ORDER — KETOROLAC TROMETHAMINE 15 MG/ML IJ SOLN
15.0000 mg | Freq: Once | INTRAMUSCULAR | Status: AC
  Administered 2023-05-15: 15 mg via INTRAVENOUS
  Filled 2023-05-15: qty 1

## 2023-05-15 MED ORDER — ALUM & MAG HYDROXIDE-SIMETH 200-200-20 MG/5ML PO SUSP
30.0000 mL | Freq: Once | ORAL | Status: AC
  Administered 2023-05-15: 30 mL via ORAL
  Filled 2023-05-15: qty 30

## 2023-05-15 MED ORDER — LIDOCAINE VISCOUS HCL 2 % MT SOLN
15.0000 mL | Freq: Once | OROMUCOSAL | Status: AC
  Administered 2023-05-15: 15 mL via ORAL
  Filled 2023-05-15: qty 15

## 2023-05-15 NOTE — ED Triage Notes (Signed)
 Pt presents to ER from home with complains of chest pain and shortness of breath. Pt reports chest pain is dull and has been dealing with pain since Wednesday. Pt has a heart monitor in place for 2 weeks. In addition, pt has developed shortness of breath. Pt talks in complete sentences no respiratory distress noted

## 2023-05-15 NOTE — ED Provider Notes (Signed)
-----------------------------------------   11:12 PM on 05/15/2023 -----------------------------------------  Assuming care from Dr. Hendrick Locke.  In short, Gerald [name] is a 58 y.o. male with a chief complaint of chest pain.  Refer to the original H&P for additional details.  The current plan of care is to follow up on d-dimer and repeat troponin and reassess.   Clinical Course as of 05/16/23 0040  Fri May 15, 2023  2330 D-Dimer, Quant: 0.29 WNL [CF]  Sat May 16, 2023  0039 The patient's repeat high-sensitivity troponin is also within normal limits.  I reassessed him.  He said he feels better.  He reports still having some dull aching pain in his chest but he said "I am going to chalk it up to indigestion".  I offered admission and we discussed the pros and cons, and he would prefer to follow-up as an outpatient with his cardiologist.  I think that is reasonable given the reassuring 2 high-sensitivity troponins and no evidence of ischemia on his EKG.  I gave strict return precautions to him and his wife and they understand and agree with the plan. [CF]    Clinical Course User Index [CF] Lynnda Sas, MD     Medications  alum & mag hydroxide-simeth (MAALOX/MYLANTA) 200-200-20 MG/5ML suspension 30 mL (30 mLs Oral Given 05/15/23 2143)    And  lidocaine (XYLOCAINE) 2 % viscous mouth solution 15 mL (15 mLs Oral Given 05/15/23 2143)  ketorolac (TORADOL) 15 MG/ML injection 15 mg (15 mg Intravenous Given 05/15/23 2304)     ED Discharge Orders          Ordered    Ambulatory referral to Cardiology       Comments: If you have not heard from the Cardiology office within the next 72 hours please call [phone].   05/16/23 0040           Final diagnoses:  Chest pain, unspecified type     Lynnda Sas, MD 05/16/23 0040

## 2023-05-15 NOTE — ED Provider Notes (Signed)
 [facility] Provider Note    Event Date/Time   First MD Initiated Contact with Patient 05/15/23 2048     (approximate)   History   Chest Pain   HPI  Gerald [name] is a 58 y.o. male who presents to the emergency department today because of concerns for chest pain.  Located in his left chest.  The pain started 2 days ago.  Initially it was a sharp pain.  Since then however it has been more of a persistent dull pain.  Patient does have history of strokes though says he is not very active at baseline and was not doing any specific exertion or activity when the pain started.  He has had some associated shortness of breath.  He denies any extremity swelling.  Denies any fevers or chills.     Physical Exam   Triage Vital Signs: ED Triage Vitals  Encounter Vitals Group     BP 05/15/23 2043 119/76     Systolic BP Percentile --      Diastolic BP Percentile --      Pulse Rate 05/15/23 2043 90     Resp --      Temp 05/15/23 2043 98.1 F (36.7 C)     Temp Source 05/15/23 2043 Oral     SpO2 05/15/23 2043 98 %     Weight 05/15/23 2040 300 lb (136.1 kg)     Height 05/15/23 2040 6\' 2"  (1.88 m)     Head Circumference --      Peak Flow --      Pain Score 05/15/23 2039 6     Pain Loc --      Pain Education --      Exclude from Growth Chart --     Most recent vital signs: Vitals:   05/15/23 2043  BP: 119/76  Pulse: 90  Temp: 98.1 F (36.7 C)  SpO2: 98%   General: Awake, alert, oriented. CV:  Good peripheral perfusion. Regular rate and rhythm. Resp:  Normal effort. Lungs clear. Abd:  No distention. Slight tenderness in left upper abdomen.  ED Results / Procedures / Treatments   Labs (all labs ordered are listed, but only abnormal results are displayed) Labs Reviewed  BASIC METABOLIC PANEL WITH GFR - Abnormal; Notable for the following components:      Result Value   Sodium 134 (*)    Glucose, Bld 147 (*)    All other components within  normal limits  CBC - Abnormal; Notable for the following components:   WBC 11.8 (*)    All other components within normal limits  BRAIN NATRIURETIC PEPTIDE  D-DIMER, QUANTITATIVE  TROPONIN I (HIGH SENSITIVITY)  TROPONIN I (HIGH SENSITIVITY)     EKG  I, Phineas Semen, attending physician, personally viewed and interpreted this EKG  EKG Time: 2040 Rate: 87 Rhythm: sinus rhythm with 1st degree av block Axis: normal Intervals: qtc 428 QRS: narrow, q waves v1, v2 ST changes: no st elevation Impression: abnormal ekg   RADIOLOGY I independently interpreted and visualized the CXR. My interpretation: No pneumonia Radiology interpretation:  IMPRESSION:  No active cardiopulmonary disease.     PROCEDURES:  Critical Care performed: No   MEDICATIONS ORDERED IN ED: Medications - No data to display   IMPRESSION / MDM / ASSESSMENT AND PLAN / ED COURSE  I reviewed the triage vital signs and the nursing notes.  Differential diagnosis includes, but is not limited to, ACS, pneumonia, PE, esophagitis  Patient's presentation is most consistent with acute presentation with potential threat to life or bodily function.   The patient is on the cardiac monitor to evaluate for evidence of arrhythmia and/or significant heart rate changes.  Patient presented to the emergency department today because of concerns for chest pain that started a few days ago.  On exam patient awake and alert.  Lungs are clear.  Regular rate and rhythm.  No extremity edema.  EKG without any ST elevation or arrhythmia.  Per my read no pneumonia on the chest x-ray.  Will obtain blood work including troponin.  Will try GI cocktail if esophagitis is playing a role.  Patient did not get any significant relief with GI cocktail.  Initial troponin negative.  Will repeat and will add on D-dimer although I do have lower concern given that patient is on Plavix.  Will try NSAID in case pain is  related to pleurisy, pericarditis or costochondritis.  Awaiting second troponin and D-dimer at time of signout.    FINAL CLINICAL IMPRESSION(S) / ED DIAGNOSES   Final diagnoses:  Chest pain, unspecified type     Note:  This document was prepared using Dragon voice recognition software and may include unintentional dictation errors.'    Phineas Semen, MD 05/15/23 2257

## 2023-05-16 NOTE — Discharge Instructions (Signed)

## 2023-05-18 DIAGNOSIS — M79605 Pain in left leg: Secondary | ICD-10-CM | POA: Diagnosis not present

## 2023-05-18 DIAGNOSIS — M79602 Pain in left arm: Secondary | ICD-10-CM | POA: Diagnosis not present

## 2023-05-18 DIAGNOSIS — M6281 Muscle weakness (generalized): Secondary | ICD-10-CM | POA: Diagnosis not present

## 2023-05-19 ENCOUNTER — Encounter: Payer: Self-pay | Admitting: Cardiology

## 2023-05-19 ENCOUNTER — Ambulatory Visit: Attending: Cardiology | Admitting: Cardiology

## 2023-05-19 VITALS — BP 140/76 | HR 77 | Ht 74.0 in | Wt 312.2 lb

## 2023-05-19 DIAGNOSIS — R072 Precordial pain: Secondary | ICD-10-CM | POA: Diagnosis not present

## 2023-05-19 DIAGNOSIS — I2510 Atherosclerotic heart disease of native coronary artery without angina pectoris: Secondary | ICD-10-CM | POA: Diagnosis not present

## 2023-05-19 DIAGNOSIS — I10 Essential (primary) hypertension: Secondary | ICD-10-CM

## 2023-05-19 DIAGNOSIS — R079 Chest pain, unspecified: Secondary | ICD-10-CM

## 2023-05-19 DIAGNOSIS — I639 Cerebral infarction, unspecified: Secondary | ICD-10-CM | POA: Diagnosis not present

## 2023-05-19 DIAGNOSIS — E782 Mixed hyperlipidemia: Secondary | ICD-10-CM | POA: Diagnosis not present

## 2023-05-19 MED ORDER — METOPROLOL TARTRATE 100 MG PO TABS: ORAL_TABLET | ORAL | 0 refills | Status: DC

## 2023-05-19 NOTE — Progress Notes (Signed)
 Cardiology Office Note:    Date:  05/19/2023   ID:  Gerald [name], DOB 07-26-1965, MRN [id number]  PCP:  V[facility] Greaser   [facility] Providers Cardiologist:  Constancia Delton, MD     Referring MD: Jacques Mattock, PA*   Chief Complaint  Patient presents with   Follow-up    Has left side chest pain, if he takes a deep breathe, has sob and more pain, he feels like some thing is sitting on his chest     History of Present Illness:    Gerald [name] is a 58 y.o. male with a hx of nonobstructive CAD (40% pLAD), hyperlipidemia, CVA x 2 (on 10/24, 3/25 s/p TNK) presenting for hospital follow-up.  Patient was seen in the ED 4 days ago due to symptoms of left-sided chest pain.  Workup with EKG and troponins was unrevealing.  He denies any trauma to the chest area.  Describes pain as dull in nature, has been persistent over the past week or so.  Patient is on aspirin and Plavix due to CVA.  Was initially on aspirin only but sustained another stroke, Plavix was added as per neurology recommendations.  Tolerating low-dose Crestor, Repatha started after last visit.  Currently wearing cardiac monitor to evaluate A-fib or flutter.   Prior notes/testing Lexiscan Myoview 05/2022 no significant ischemia. Echo 11/2022 EF 50 to 55%. Echo 2019 EF 60 to 65% Left heart cath 2018 nonobstructive CAD 40% proximal LAD.   Past Medical History:  Diagnosis Date   Abnormal LFTs    Anxiety    Atypical chest pain    Borderline diabetes    CAD (coronary artery disease)    a.) PCI and stent placement (unknown type) to mLAD and mLCx; date unknown. b.) LHC 09/10/2010: 40% mLAD; no intervention. c.) LHC 09/12/2011: EF 60%; 30% ISR mLAD, 40% dLAD, 30% pLCx, 30% ISR mLCx, 40% mRCA; no intervention. d.) LHC 05/23/2014: EF >55%; 30% p-mLAD; no intervention. e.) LHC 06/11/2016: EF 55-65%; LVEDP norm; 30-40% p-mLAD; no intervention.   Carcinoma (HCC)    Chronic pain syndrome     Depression    Fundic gland polyps of stomach, benign    Gastritis    GERD (gastroesophageal reflux disease)    Hepatic steatosis    Hyperlipidemia    Hypertension    Migraines    Myocardial infarction [facility]) 2008   was told by PCP   Nonischemic cardiomyopathy (HCC)    a.) TTE 09/12/2011: EF 35-45%. b.) LHC 09/12/2011: EF 60%. c.) TTE 06/04/2012: EF 55-60%. d.) LHC 05/23/2014: EF >55%. e.) TTE 06/11/2016: EF 55-60%; G1DD. f.) LHC 06/11/2016: EF 55-65%. g.) TTE 01/08/2018: EF 60-65%.   Obesity    Occlusion and stenosis of bilateral carotid arteries    OSA on CPAP    Osteoarthritis of both knees    Shortness of breath    Squamous cell carcinoma of skin 02/27/2020   left distal lat deltoid - [facility]    Past Surgical History:  Procedure Laterality Date   BONE EXCISION Right 11/21/2020   Procedure: PART EXCISION BONE- PHALANX;  Surgeon: Anell Baptist, DPM;  Location: [facility];  Service: Podiatry;  Laterality: Right;   CARDIAC CATHETERIZATION N/A 09/2011   [facility]; EF 50% with 30% mid LAD stenosis and no obstructive disease.   CARDIAC CATHETERIZATION  09/2010   [facility]; Mid LAD 40% stenosis; Mid Circumflex:Normal; Mid RCA; Normal   CARDIAC CATHETERIZATION     COLONOSCOPY     COLONOSCOPY  WITH PROPOFOL N/A 04/10/2021   Procedure: COLONOSCOPY WITH PROPOFOL;  Surgeon: Luke Salaam, MD;  Location: [facility] ENDOSCOPY;  Service: Gastroenterology;  Laterality: N/A;   ESOPHAGOGASTRODUODENOSCOPY N/A 04/10/2021   Procedure: ESOPHAGOGASTRODUODENOSCOPY (EGD);  Surgeon: Luke Salaam, MD;  Location: [facility] ENDOSCOPY;  Service: Gastroenterology;  Laterality: N/A;   ESOPHAGOGASTRODUODENOSCOPY (EGD) WITH PROPOFOL N/A 06/10/2017   Procedure: ESOPHAGOGASTRODUODENOSCOPY (EGD) WITH PROPOFOL;  Surgeon: Luke Salaam, MD;  Location: [facility] ENDOSCOPY;  Service: Gastroenterology;  Laterality: N/A;   ESOPHAGOGASTRODUODENOSCOPY (EGD) WITH PROPOFOL N/A 01/01/2022   Procedure: ESOPHAGOGASTRODUODENOSCOPY (EGD) WITH PROPOFOL;   Surgeon: Luke Salaam, MD;  Location: [facility] ENDOSCOPY;  Service: Gastroenterology;  Laterality: N/A;   ESOPHAGOGASTRODUODENOSCOPY (EGD) WITH PROPOFOL N/A 01/06/2022   Procedure: ESOPHAGOGASTRODUODENOSCOPY (EGD) WITH PROPOFOL;  Surgeon: Luke Salaam, MD;  Location: [facility] ENDOSCOPY;  Service: Gastroenterology;  Laterality: N/A;   HAMMER TOE SURGERY Right 11/21/2020   Procedure: HAMMERTOE CORRECTION;  Surgeon: Anell Baptist, DPM;  Location: [facility];  Service: Podiatry;  Laterality: Right;   INSERTION OF MESH  04/16/2021   Procedure: INSERTION OF MESH;  Surgeon: Emmalene Hare, MD;  Location: [facility] ORS;  Service: General;;   KNEE ARTHROSCOPY WITH MEDIAL MENISECTOMY Right 09/16/2012   Procedure: RIGHT KNEE ARTHROSCOPY WITH MEDIAL AND LATERAL MENISECTOMY, CHONDROPLASTY;  Surgeon: Ferd Householder, MD;  Location: [facility];  Service: Orthopedics;  Laterality: Right;  RIGHT KNEE SCOPE MEDIAL MENISCECTOMY   LEFT HEART CATH AND CORONARY ANGIOGRAPHY N/A 06/11/2016   Procedure: Left Heart Cath and Coronary Angiography;  Surgeon: Devorah Fonder, MD;  Location: [facility] INVASIVE CV LAB;  Service: Cardiovascular;  Laterality: N/A;   UMBILICAL HERNIA REPAIR N/A 04/16/2021   Procedure: HERNIA REPAIR UMBILICAL ADULT, open;  Surgeon: Emmalene Hare, MD;  Location: [facility] ORS;  Service: General;  Laterality: N/A;    Current Medications: Current Meds  Medication Sig   aspirin EC 81 MG tablet Take 1 tablet (81 mg total) by mouth daily. Swallow whole.   citalopram (CELEXA) 40 MG tablet TAKE 1 TABLET BY MOUTH EVERY DAY   clopidogrel (PLAVIX) 75 MG tablet Take 1 tablet (75 mg total) by mouth daily.   cyclobenzaprine (FLEXERIL) 10 MG tablet Take 1 tablet (10 mg total) by mouth at bedtime. Take one tab po qhs for back spasm prn only   Evolocumab (REPATHA SURECLICK) 140 MG/ML SOAJ Inject 140 mg into the skin every 14 (fourteen) days.   furosemide (LASIX) 20 MG tablet TAKE 1 TABLET BY MOUTH EVERY DAY AS NEEDED  FOR LEG SWELLING   gabapentin (NEURONTIN) 300 MG capsule Take 1 capsule (300 mg total) by mouth 2 (two) times daily. (Patient taking differently: Take 300 mg by mouth 3 (three) times daily.)   lisinopril-hydrochlorothiazide (ZESTORETIC) 20-25 MG tablet TAKE 1 TABLET BY MOUTH EVERY DAY   metoprolol tartrate (LOPRESSOR) 100 MG tablet TAKE 1 TABLET 2 HR PRIOR TO CARDIAC PROCEDURE   Multiple Vitamins-Minerals (MULTIVITAMIN ADULTS PO) Take 1 tablet by mouth daily.   nitroGLYCERIN (NITROSTAT) 0.4 MG SL tablet Place 1 tablet (0.4 mg total) under the tongue every 5 (five) minutes x 3 doses as needed for chest pain.   nortriptyline (PAMELOR) 10 MG capsule Take 10 mg by mouth at bedtime.   rosuvastatin (CRESTOR) 5 MG tablet Take 1 tablet (5 mg total) by mouth at bedtime.     Allergies:   Nexlizet [bempedoic acid-ezetimibe] and Statins   Social History   Socioeconomic History   Marital status: Married    Spouse name: Not on file   Number  of children: 3   Years of education: Not on file   Highest education level: Not on file  Occupational History   Occupation: Camera operator  Tobacco Use   Smoking status: Never    Passive exposure: Past   Smokeless tobacco: Former    Types: Chew    Quit date: 1987  Vaping Use   Vaping status: Never Used  Substance and Sexual Activity   Alcohol use: Not Currently   Drug use: No   Sexual activity: Yes  Other Topics Concern   Not on file  Social History Narrative   Married.  39 yo son.   Wife on disability for bipolar disorder.     Social Drivers of Corporate investment banker Strain: Not on file  Food Insecurity: Food Insecurity Present (04/17/2023)   Hunger Vital Sign    Worried About Running Out of Food in the Last Year: Sometimes true    Ran Out of Food in the Last Year: Sometimes true  Transportation Needs: No Transportation Needs (04/17/2023)   PRAPARE - Administrator, Civil Service (Medical): No    Lack of Transportation  (Non-Medical): No  Physical Activity: Not on file  Stress: Not on file  Social Connections: Unknown (11/18/2021)   Received from [facility]   Social Network    Social Network: Not on file     Family History: The patient's family history includes Breast cancer in his mother; Heart attack in his father; Heart disease (age of onset: 82) in his father; Hypertension in his mother; Stomach cancer in his father.  ROS:   Please see the history of present illness.     All other systems reviewed and are negative.  EKGs/Labs/Other Studies Reviewed:    The following studies were reviewed today:        Recent Labs: 09/08/2022: TSH 3.030 04/18/2023: Magnesium 2.0 05/11/2023: ALT 30 05/15/2023: B Natriuretic Peptide 20.4; BUN 13; Creatinine, Ser 0.79; Hemoglobin 14.1; Platelets 229; Potassium 3.5; Sodium 134  Recent Lipid Panel    Component Value Date/Time   CHOL 155 05/11/2023 0150   CHOL 214 (H) 09/08/2022 1030   CHOL 205 (H) 04/01/2021 1335   TRIG 222 (H) 05/11/2023 0150   TRIG 239 (H) 04/01/2021 1335   HDL 35 (L) 05/11/2023 0150   HDL 32 (L) 09/08/2022 1030   HDL 34 (L) 09/13/2011 0307   CHOLHDL 4.4 05/11/2023 0150   VLDL 44 (H) 05/11/2023 0150   VLDL 27 09/13/2011 0307   LDLCALC 76 05/11/2023 0150   LDLCALC 120 (H) 09/08/2022 1030   LDLCALC 115 (H) 09/13/2011 0307   LDLDIRECT 145.9 11/08/2013 0911     Risk Assessment/Calculations:          Physical Exam:    VS:  BP (!) 140/76 (BP Location: Left Arm, Patient Position: Sitting, Cuff Size: Normal)   Pulse 77   Ht 6\' 2"  (1.88 m)   Wt (!) 312 lb 3.2 oz (141.6 kg)   SpO2 97%   BMI 40.08 kg/m     Wt Readings from Last 3 Encounters:  05/19/23 (!) 312 lb 3.2 oz (141.6 kg)  05/15/23 300 lb (136.1 kg)  05/10/23 300 lb (136.1 kg)     GEN:  Well nourished, well developed in no acute distress HEENT: Normal NECK: No JVD; No carotid bruits CARDIAC: RRR, no murmurs, rubs, gallops RESPIRATORY:  Clear to auscultation  without rales, wheezing or rhonchi  ABDOMEN: Soft, non-tender, non-distended MUSCULOSKELETAL:  No edema; left chest  tender with palpation. SKIN: Warm and dry NEUROLOGIC:  Alert and oriented x 3 PSYCHIATRIC:  Normal affect   ASSESSMENT:    1. Precordial pain   2. Coronary artery disease, unspecified vessel or lesion type, unspecified whether angina present, unspecified whether native or transplanted heart   3. Cerebrovascular accident (CVA), unspecified mechanism (HCC)   4. Primary hypertension   5. Mixed hyperlipidemia   6. Chest pain, unspecified type     PLAN:    In order of problems listed above:  Chest pain, history of nonobstructive CAD 40% in 2018.  Echo 10/24 EF 50 to 55%.  Obtain coronary CTA to evaluate any progression of CAD.  Left chest tender on palpation suggesting musculoskeletal etiology.  OTC NSAIDs advised, follow-up with PCP.  Continue aspirin, Plavix, Repatha, Crestor 5 mg daily.  Intolerant to higher dose of statin. CVA x 2.  Left-sided weakness.  Cardiac monitor x 4 weeks previously placed.  Will review when resulted..  Continue aspirin, Plavix, Repatha. Hypertension, BP controlled.  Continue lisinopril-HCTZ 20-25 mg daily. Hyperlipidemia, not tolerant to statins.  Continue Crestor 5 mg daily, Repatha.  Follow-up 2 to 3 months     edication Adjustments/Labs and Tests Ordered: Current medicines are reviewed at length with the patient today.  Concerns regarding medicines are outlined above.  Orders Placed This Encounter  Procedures   CT CORONARY MORPH W/CTA COR W/SCORE W/CA W/CM &/OR WO/CM   Basic metabolic panel with GFR   EKG [id number]   Meds ordered this encounter  Medications   metoprolol tartrate (LOPRESSOR) 100 MG tablet    Sig: TAKE 1 TABLET 2 HR PRIOR TO CARDIAC PROCEDURE    Dispense:  1 tablet    Refill:  0    Patient Instructions  Medication Instructions:  Take one 100 mg metoprolol 2 hours prior to cardiac CT.   *If you need a refill on  your cardiac medications before your next appointment, please call your pharmacy*  Lab Work: Your provider would like for you to have following labs drawn today BMP .   If you have labs (blood work) drawn today and your tests are completely normal, you will receive your results only by: MyChart Message (if you have MyChart) OR A paper copy in the mail If you have any lab test that is abnormal or we need to change your treatment, we will call you to review the results.  Testing/Procedures:   Your cardiac CT will be scheduled at one of the below locations:    [facility] [address] [place], [place] [address] [phone]  OR   [facility] [address] [place], [place] [address] [phone]  If scheduled at [facility] or [facility], please arrive 15 mins early for check-in and test prep.  There is spacious parking and easy access to the radiology department from the [facility] entrance. Please enter here and check-in with the desk attendant.    Please follow these instructions carefully (unless otherwise directed):  An IV will be required for this test and Nitroglycerin will be given.  Hold all erectile dysfunction medications at least 3 days (72 hrs) prior to test. (Ie viagra, cialis, sildenafil, tadalafil, etc)   On the Night Before the Test: Be sure to Drink plenty of water. Do not consume any caffeinated/decaffeinated beverages or chocolate 12 hours prior to your test. Do not take any antihistamines 12 hours prior to your  test.  On the Day of the Test: Drink plenty of water until 1 hour prior to the test. Do not eat any food 1 hour prior to test. You may take your regular medications prior to the test.  Take metoprolol (Lopressor) two hours prior to test. If you take Furosemide/Hydrochlorothiazide/Spironolactone/Chlorthalidone,  please HOLD on the morning of the test.  After the Test: Drink plenty of water. After receiving IV contrast, you may experience a mild flushed feeling. This is normal. On occasion, you may experience a mild rash up to 24 hours after the test. This is not dangerous. If this occurs, you can take Benadryl 25 mg, Zyrtec, Claritin, or Allegra and increase your fluid intake. (Patients taking Tikosyn should avoid Benadryl, and may take Zyrtec, Claritin, or Allegra) If you experience trouble breathing, this can be serious. If it is severe call 911 IMMEDIATELY. If it is mild, please call our office.  We will call to schedule your test 2-4 weeks out understanding that some insurance companies will need an authorization prior to the service being performed.   For more information and frequently asked questions, please visit our website : [email or web address]  For non-scheduling related questions, please contact the cardiac imaging nurse navigator should you have any questions/concerns: Cardiac Imaging Nurse Navigators Direct Office Dial: [phone]   For scheduling needs, including cancellations and rescheduling, please call [place], [phone].   Follow-Up: At [facility], you and your health needs are our priority.  As part of our continuing mission to provide you with exceptional heart care, our providers are all part of one team.  This team includes your primary Cardiologist (physician) and Advanced Practice Providers or APPs (Physician Assistants and Nurse Practitioners) who all work together to provide you with the care you need, when you need it.  Your next appointment:   Keep your next appointment.  Provider:   You may see Constancia Delton, MD or one of the following Advanced Practice Providers on your designated Care Team:   Laneta Pintos, NP Gildardo Labrador, PA-C Varney Gentleman, PA-C Cadence Los Luceros, PA-C Ronald Cockayne, NP Morey Ar, NP    We recommend  signing up for the patient portal called "MyChart".  Sign up information is provided on this After Visit Summary.  MyChart is used to connect with patients for Virtual Visits (Telemedicine).  Patients are able to view lab/test results, encounter notes, upcoming appointments, etc.  Non-urgent messages can be sent to your provider as well.   To learn more about what you can do with MyChart, go to [email or web address].        Signed, Constancia Delton, MD  05/19/2023 10:22 AM    [facility]

## 2023-05-19 NOTE — Patient Instructions (Signed)
 Medication Instructions:  Take one 100 mg metoprolol 2 hours prior to cardiac CT.   *If you need a refill on your cardiac medications before your next appointment, please call your pharmacy*  Lab Work: Your provider would like for you to have following labs drawn today BMP .   If you have labs (blood work) drawn today and your tests are completely normal, you will receive your results only by: MyChart Message (if you have MyChart) OR A paper copy in the mail If you have any lab test that is abnormal or we need to change your treatment, we will call you to review the results.  Testing/Procedures:   Your cardiac CT will be scheduled at one of the below locations:    [facility] [address] [place], [place] [address] [phone]  OR   [facility] [address] [place], [place] [address] [phone]  If scheduled at [facility] or [facility], please arrive 15 mins early for check-in and test prep.  There is spacious parking and easy access to the radiology department from the [facility] V[facility] entrance. Please enter here and check-in with the desk attendant.    Please follow these instructions carefully (unless otherwise directed):  An IV will be required for this test and Nitroglycerin will be given.  Hold all erectile dysfunction medications at least 3 days (72 hrs) prior to test. (Ie viagra, cialis, sildenafil, tadalafil, etc)   On the Night Before the Test: Be sure to Drink plenty of water. Do not consume any caffeinated/decaffeinated beverages or chocolate 12 hours prior to your test. Do not take any antihistamines 12 hours prior to your test.  On the Day of the Test: Drink plenty of water until 1 hour prior to the test. Do not eat any food 1 hour prior to test. You may take your regular medications prior to the test.  Take metoprolol  (Lopressor) two hours prior to test. If you take Furosemide/Hydrochlorothiazide/Spironolactone/Chlorthalidone, please HOLD on the morning of the test.  After the Test: Drink plenty of water. After receiving IV contrast, you may experience a mild flushed feeling. This is normal. On occasion, you may experience a mild rash up to 24 hours after the test. This is not dangerous. If this occurs, you can take Benadryl 25 mg, Zyrtec, Claritin, or Allegra and increase your fluid intake. (Patients taking Tikosyn should avoid Benadryl, and may take Zyrtec, Claritin, or Allegra) If you experience trouble breathing, this can be serious. If it is severe call 911 IMMEDIATELY. If it is mild, please call our office.  We will call to schedule your test 2-4 weeks out understanding that some insurance companies will need an authorization prior to the service being performed.   For more information and frequently asked questions, please visit our website : [email or web address]  For non-scheduling related questions, please contact the cardiac imaging nurse navigator should you have any questions/concerns: Cardiac Imaging Nurse Navigators Direct Office Dial: [phone]   For scheduling needs, including cancellations and rescheduling, please call [place], [phone].   Follow-Up: At [facility], you and your health needs are our priority.  As part of our continuing mission to provide you with exceptional heart care, our providers are all part of one team.  This team includes your primary Cardiologist (physician) and Advanced Practice Providers or APPs (Physician Assistants and Nurse Practitioners) who all work together to provide you with the care you  need, when you need it.  Your next appointment:   Keep your next appointment.  Provider:   You may see Constancia Delton, MD or one of the following Advanced Practice Providers on your designated Care Team:   Laneta Pintos, NP Gildardo Labrador, PA-C Varney Gentleman, PA-C Cadence Kelly, PA-C Ronald Cockayne, NP Morey Ar, NP    We recommend signing up for the patient portal called "MyChart".  Sign up information is provided on this After Visit Summary.  MyChart is used to connect with patients for Virtual Visits (Telemedicine).  Patients are able to view lab/test results, encounter notes, upcoming appointments, etc.  Non-urgent messages can be sent to your provider as well.   To learn more about what you can do with MyChart, go to [email or web address].

## 2023-05-20 LAB — BASIC METABOLIC PANEL WITH GFR
BUN/Creatinine Ratio: 17 (ref 9–20)
BUN: 15 mg/dL (ref 6–24)
CO2: 22 mmol/L (ref 20–29)
Calcium: 9.3 mg/dL (ref 8.7–10.2)
Chloride: 102 mmol/L (ref 96–106)
Creatinine, Ser: 0.87 mg/dL (ref 0.76–1.27)
Glucose: 197 mg/dL — ABNORMAL HIGH (ref 70–99)
Potassium: 4.3 mmol/L (ref 3.5–5.2)
Sodium: 138 mmol/L (ref 134–144)
eGFR: 101 mL/min/{1.73_m2} (ref >59)

## 2023-05-22 DIAGNOSIS — M79602 Pain in left arm: Secondary | ICD-10-CM | POA: Diagnosis not present

## 2023-05-22 DIAGNOSIS — M6281 Muscle weakness (generalized): Secondary | ICD-10-CM | POA: Diagnosis not present

## 2023-05-22 DIAGNOSIS — M79605 Pain in left leg: Secondary | ICD-10-CM | POA: Diagnosis not present

## 2023-05-25 ENCOUNTER — Telehealth: Payer: Self-pay | Admitting: Pharmacy Technician

## 2023-05-25 NOTE — Telephone Encounter (Signed)
 Pharmacy Patient Advocate Encounter  Received notification from [facility] that Prior Authorization for repatha  has been DENIED.  Full denial letter will be uploaded to the media tab. See denial reason below.   PA #/Case ID/Reference #: [id number]   The patient has only been on rosuvastatin  since 04/18/23. Looks like he was on ezetimibe  then was changed to nexlizet  (for unknown reason) but he had a rash from nexlizet .

## 2023-05-25 NOTE — Telephone Encounter (Signed)
 Pharmacy Patient Advocate Encounter   Received notification from Onbase that prior authorization for repatha  is required/requested.   Insurance verification completed.   The patient is insured through [facility] .   Per test claim: PA required; PA submitted to above mentioned insurance via CoverMyMeds Key/confirmation #/EOC [id number] Status is pending

## 2023-05-27 ENCOUNTER — Telehealth (HOSPITAL_COMMUNITY): Payer: Self-pay | Admitting: *Deleted

## 2023-05-27 NOTE — Telephone Encounter (Signed)
 Reaching out to patient to offer assistance regarding upcoming cardiac imaging study; pt verbalizes understanding of appt date/time, parking situation and where to check in, pre-test NPO status and medications ordered, and verified current allergies; name and call back number provided for further questions should they arise  Gerald Brick RN Navigator Cardiac Imaging [facility] [phone] office [phone] cell  Patient to take 100mg  metoprolol tartrate two hours prior to his cardiac CT scan.

## 2023-05-27 NOTE — Telephone Encounter (Signed)
 Attempted to call patient regarding upcoming cardiac CT appointment. Left message on voicemail with name and callback number  Larey Brick RN Navigator Cardiac Imaging [facility] [phone] Office [phone] Cell

## 2023-05-28 ENCOUNTER — Other Ambulatory Visit (HOSPITAL_COMMUNITY): Payer: Self-pay

## 2023-05-28 ENCOUNTER — Other Ambulatory Visit: Payer: Self-pay

## 2023-05-28 ENCOUNTER — Telehealth: Payer: Self-pay

## 2023-05-28 ENCOUNTER — Ambulatory Visit
Admission: RE | Admit: 2023-05-28 | Discharge: 2023-05-28 | Disposition: A | Discharge status: Home / Self Care | Source: Ambulatory Visit | Attending: Cardiology | Admitting: Cardiology

## 2023-05-28 DIAGNOSIS — R079 Chest pain, unspecified: Secondary | ICD-10-CM | POA: Diagnosis not present

## 2023-05-28 MED ORDER — SODIUM CHLORIDE 0.9 % IV SOLN: INTRAVENOUS | Status: DC

## 2023-05-28 MED ORDER — IOHEXOL 350 MG/ML SOLN
100.0000 mL | Freq: Once | INTRAVENOUS | Status: AC | PRN
  Administered 2023-05-28: 100 mL via INTRAVENOUS

## 2023-05-28 MED ORDER — REPATHA SURECLICK 140 MG/ML ~~LOC~~ SOAJ
140.0000 mg | SUBCUTANEOUS | 6 refills | Status: AC
  Filled 2023-05-28: qty 2, 28d supply, fill #0
  Filled 2023-07-03: qty 2, 28d supply, fill #1
  Filled 2023-07-26 – 2023-09-25 (×2): qty 2, 28d supply, fill #2

## 2023-05-28 MED ORDER — NITROGLYCERIN 0.4 MG SL SUBL: 0.4000 mg | SUBLINGUAL_TABLET | Freq: Once | SUBLINGUAL | Status: DC

## 2023-05-28 MED ORDER — NITROGLYCERIN 0.4 MG SL SUBL
0.8000 mg | SUBLINGUAL_TABLET | Freq: Once | SUBLINGUAL | Status: AC
  Administered 2023-05-28: 0.8 mg via SUBLINGUAL

## 2023-05-28 MED ORDER — METOPROLOL TARTRATE 5 MG/5ML IV SOLN
10.0000 mg | Freq: Once | INTRAVENOUS | Status: AC | PRN
  Administered 2023-05-28: 10 mg via INTRAVENOUS

## 2023-05-28 MED ORDER — DILTIAZEM HCL 25 MG/5ML IV SOLN: 10.0000 mg | INTRAVENOUS | Status: DC | PRN

## 2023-05-28 NOTE — Telephone Encounter (Signed)
 Pharmacy Patient Advocate Encounter  Received notification from [facility] that Prior Authorization for REPATHA  has been APPROVED from 05/28/23 to 05/27/24. Ran test claim, Copay is $15. This test claim was processed through [facility]- copay amounts may vary at other pharmacies due to pharmacy/plan contracts, or as the patient moves through the different stages of their insurance plan.

## 2023-05-28 NOTE — Telephone Encounter (Signed)
 Pharmacy Patient Advocate Encounter   Received notification from Physician's Office that prior authorization for REPATHA  is required/requested.   Insurance verification completed.   The patient is insured through [facility] .   Per test claim: PA required; PA submitted to above mentioned insurance via CoverMyMeds Key/confirmation #/EOC [facility] Status is pending

## 2023-05-28 NOTE — Telephone Encounter (Signed)
 Done. Thanks.

## 2023-05-28 NOTE — Telephone Encounter (Signed)
 PA request has been Submitted. New Encounter has been or will be created for follow up. For additional info see Pharmacy Prior Auth telephone encounter from 05/28/23.

## 2023-05-29 ENCOUNTER — Telehealth: Payer: Self-pay | Admitting: Cardiology

## 2023-05-29 DIAGNOSIS — I639 Cerebral infarction, unspecified: Secondary | ICD-10-CM

## 2023-05-29 NOTE — Telephone Encounter (Signed)
 Patient said that he has not received his second heart monitor yet

## 2023-05-29 NOTE — Telephone Encounter (Signed)
 Spoke with patient and made him aware that the second monitor is on it's way. He verbalized understanding and had no further questions.

## 2023-05-29 NOTE — Telephone Encounter (Signed)
 Left voicemail message to call back

## 2023-06-01 ENCOUNTER — Encounter: Payer: Self-pay | Admitting: Physician Assistant

## 2023-06-01 ENCOUNTER — Ambulatory Visit (INDEPENDENT_AMBULATORY_CARE_PROVIDER_SITE_OTHER): Admitting: Physician Assistant

## 2023-06-01 ENCOUNTER — Other Ambulatory Visit: Payer: Self-pay | Admitting: Physician Assistant

## 2023-06-01 VITALS — BP 136/82 | HR 97 | Temp 98.7°F | Resp 16 | Ht 74.0 in | Wt 311.6 lb

## 2023-06-01 DIAGNOSIS — I6930 Unspecified sequelae of cerebral infarction: Secondary | ICD-10-CM

## 2023-06-01 DIAGNOSIS — I10 Essential (primary) hypertension: Secondary | ICD-10-CM | POA: Diagnosis not present

## 2023-06-01 DIAGNOSIS — E1165 Type 2 diabetes mellitus with hyperglycemia: Secondary | ICD-10-CM | POA: Diagnosis not present

## 2023-06-01 MED ORDER — TRESIBA FLEXTOUCH 100 UNIT/ML ~~LOC~~ SOPN: 10.0000 [IU] | PEN_INJECTOR | Freq: Every day | SUBCUTANEOUS | 2 refills | Status: DC

## 2023-06-01 MED ORDER — SEMAGLUTIDE(0.25 OR 0.5MG/DOS) 2 MG/3ML ~~LOC~~ SOPN: 0.2500 mg | PEN_INJECTOR | Freq: Every day | SUBCUTANEOUS | 0 refills | Status: DC

## 2023-06-01 NOTE — Progress Notes (Signed)
 [facility] [address] [place], [place] [address]  Internal MEDICINE  Office Visit Note  Patient Name: Gerald [name]  [id number]  [id number]  Date of Service: 06/13/2023  Chief Complaint  Patient presents with   Depression   Gastroesophageal Reflux   Hypertension   Hyperlipidemia   Follow-up    HPI Pt is here for routine follow up -Went to ED for CP and SOB on 05/15/23, seeing cardiology and is on crestor  and will be starting repatha  -Still having numbness on left side and headaches. Doing PT. Walking with cane, did fall into wall yesterday off balance. Seeing neurology on Wednesday. Declines needing any other assistive devices -Quit job, because couldn't do the tasks required with recent health changes. Had completed disability forms for him, but he ended up resigning anyway -Not checking BG at home recently, never got ozempic . Insurance changed. Will try resending, but will send insulin  in meantime to help get better control. He has been on this previously a few years ago -getting gabapentin  via goodrx  Current Medication: Outpatient Encounter Medications as of 06/01/2023  Medication Sig   aspirin  EC 81 MG tablet Take 1 tablet (81 mg total) by mouth daily. Swallow whole.   citalopram  (CELEXA ) 40 MG tablet TAKE 1 TABLET BY MOUTH EVERY DAY   clopidogrel  (PLAVIX ) 75 MG tablet Take 1 tablet (75 mg total) by mouth daily.   cyclobenzaprine  (FLEXERIL ) 10 MG tablet Take 1 tablet (10 mg total) by mouth at bedtime. Take one tab po qhs for back spasm prn only   Evolocumab  (REPATHA  SURECLICK) 140 MG/ML SOAJ Inject 140 mg into the skin every 14 (fourteen) days.   furosemide  (LASIX ) 20 MG tablet TAKE 1 TABLET BY MOUTH EVERY DAY AS NEEDED FOR LEG SWELLING   gabapentin  (NEURONTIN ) 300 MG capsule Take 1 capsule (300 mg total) by mouth 2 (two) times daily. (Patient taking differently: Take 300 mg by mouth 3 (three) times daily.)   insulin  degludec (TRESIBA  FLEXTOUCH) 100  UNIT/ML FlexTouch Pen Inject 10 Units into the skin daily.   lisinopril -hydrochlorothiazide  (ZESTORETIC ) 20-25 MG tablet TAKE 1 TABLET BY MOUTH EVERY DAY   metoprolol  tartrate (LOPRESSOR ) 100 MG tablet TAKE 1 TABLET 2 HR PRIOR TO CARDIAC PROCEDURE   Multiple Vitamins-Minerals (MULTIVITAMIN ADULTS PO) Take 1 tablet by mouth daily.   nitroGLYCERIN  (NITROSTAT ) 0.4 MG SL tablet Place 1 tablet (0.4 mg total) under the tongue every 5 (five) minutes x 3 doses as needed for chest pain.   nortriptyline (PAMELOR) 10 MG capsule Take 10 mg by mouth at bedtime.   rosuvastatin  (CRESTOR ) 5 MG tablet Take 1 tablet (5 mg total) by mouth at bedtime.   [DISCONTINUED] Semaglutide ,0.25 or 0.5MG /DOS, 2 MG/3ML SOPN Inject 0.25 mg into the skin daily.   lamoTRIgine  (LAMICTAL ) 25 MG tablet Take 1 tablet (25 mg total) by mouth at bedtime for 14 days, THEN 1 tablet (25 mg total) 2 (two) times daily for 14 days.   No facility-administered encounter medications on file as of 06/01/2023.    Surgical History: Past Surgical History:  Procedure Laterality Date   BONE EXCISION Right 11/21/2020   Procedure: PART EXCISION BONE- PHALANX;  Surgeon: Anell Baptist, DPM;  Location: [facility];  Service: Podiatry;  Laterality: Right;   CARDIAC CATHETERIZATION N/A 09/2011   [facility]; EF 50% with 30% mid LAD stenosis and no obstructive disease.   CARDIAC CATHETERIZATION  09/2010   [facility]; Mid LAD 40% stenosis; Mid Circumflex:Normal; Mid RCA; Normal   CARDIAC CATHETERIZATION  COLONOSCOPY     COLONOSCOPY WITH PROPOFOL  N/A 04/10/2021   Procedure: COLONOSCOPY WITH PROPOFOL ;  Surgeon: Luke Salaam, MD;  Location: [facility];  Service: Gastroenterology;  Laterality: N/A;   ESOPHAGOGASTRODUODENOSCOPY N/A 04/10/2021   Procedure: ESOPHAGOGASTRODUODENOSCOPY (EGD);  Surgeon: Luke Salaam, MD;  Location: [facility];  Service: Gastroenterology;  Laterality: N/A;   ESOPHAGOGASTRODUODENOSCOPY (EGD) WITH PROPOFOL  N/A 06/10/2017   Procedure:  ESOPHAGOGASTRODUODENOSCOPY (EGD) WITH PROPOFOL ;  Surgeon: Luke Salaam, MD;  Location: [facility];  Service: Gastroenterology;  Laterality: N/A;   ESOPHAGOGASTRODUODENOSCOPY (EGD) WITH PROPOFOL  N/A 01/01/2022   Procedure: ESOPHAGOGASTRODUODENOSCOPY (EGD) WITH PROPOFOL ;  Surgeon: Luke Salaam, MD;  Location: [facility];  Service: Gastroenterology;  Laterality: N/A;   ESOPHAGOGASTRODUODENOSCOPY (EGD) WITH PROPOFOL  N/A 01/06/2022   Procedure: ESOPHAGOGASTRODUODENOSCOPY (EGD) WITH PROPOFOL ;  Surgeon: Luke Salaam, MD;  Location: [facility];  Service: Gastroenterology;  Laterality: N/A;   HAMMER TOE SURGERY Right 11/21/2020   Procedure: HAMMERTOE CORRECTION;  Surgeon: Anell Baptist, DPM;  Location: [facility];  Service: Podiatry;  Laterality: Right;   INSERTION OF MESH  04/16/2021   Procedure: INSERTION OF MESH;  Surgeon: Emmalene Hare, MD;  Location: [facility];  Service: General;;   KNEE ARTHROSCOPY WITH MEDIAL MENISECTOMY Right 09/16/2012   Procedure: RIGHT KNEE ARTHROSCOPY WITH MEDIAL AND LATERAL MENISECTOMY, CHONDROPLASTY;  Surgeon: Ferd Householder, MD;  Location: [facility];  Service: Orthopedics;  Laterality: Right;  RIGHT KNEE SCOPE MEDIAL MENISCECTOMY   LEFT HEART CATH AND CORONARY ANGIOGRAPHY N/A 06/11/2016   Procedure: Left Heart Cath and Coronary Angiography;  Surgeon: Devorah Fonder, MD;  Location: [facility];  Service: Cardiovascular;  Laterality: N/A;   UMBILICAL HERNIA REPAIR N/A 04/16/2021   Procedure: HERNIA REPAIR UMBILICAL ADULT, open;  Surgeon: Emmalene Hare, MD;  Location: [facility];  Service: General;  Laterality: N/A;    Medical History: Past Medical History:  Diagnosis Date   Abnormal LFTs    Anxiety    Atypical chest pain    Borderline diabetes    CAD (coronary artery disease)    a.) PCI and stent placement (unknown type) to mLAD and mLCx; date unknown. b.) LHC 09/10/2010: 40% mLAD; no intervention. c.) LHC 09/12/2011: EF 60%; 30%  ISR mLAD, 40% dLAD, 30% pLCx, 30% ISR mLCx, 40% mRCA; no intervention. d.) LHC 05/23/2014: EF >55%; 30% p-mLAD; no intervention. e.) LHC 06/11/2016: EF 55-65%; LVEDP norm; 30-40% p-mLAD; no intervention.   Carcinoma (HCC)    Chronic pain syndrome    Depression    Fundic gland polyps of stomach, benign    Gastritis    GERD (gastroesophageal reflux disease)    Hepatic steatosis    Hyperlipidemia    Hypertension    Migraines    Myocardial infarction [facility]) 2008   was told by PCP   Nonischemic cardiomyopathy (HCC)    a.) TTE 09/12/2011: EF 35-45%. b.) LHC 09/12/2011: EF 60%. c.) TTE 06/04/2012: EF 55-60%. d.) LHC 05/23/2014: EF >55%. e.) TTE 06/11/2016: EF 55-60%; G1DD. f.) LHC 06/11/2016: EF 55-65%. g.) TTE 01/08/2018: EF 60-65%.   Obesity    Occlusion and stenosis of bilateral carotid arteries    OSA on CPAP    Osteoarthritis of both knees    Shortness of breath    Squamous cell carcinoma of skin 02/27/2020   left distal lat deltoid - [facility]    Family History: Family History  Problem Relation Age of Onset   Heart disease Father 4       MI   Heart attack Father  Stomach cancer Father    Hypertension Mother    Breast cancer Mother     Social History   Socioeconomic History   Marital status: Married    Spouse name: Not on file   Number of children: 3   Years of education: Not on file   Highest education level: Not on file  Occupational History   Occupation: Camera operator  Tobacco Use   Smoking status: Never    Passive exposure: Past   Smokeless tobacco: Former    Types: Chew    Quit date: 1987  Vaping Use   Vaping status: Never Used  Substance and Sexual Activity   Alcohol use: Not Currently   Drug use: No   Sexual activity: Yes  Other Topics Concern   Not on file  Social History Narrative   Married.  73 yo son.   Wife on disability for bipolar disorder.     Social Drivers of Corporate investment banker Strain: Not on file  Food Insecurity: Food  Insecurity Present (04/17/2023)   Hunger Vital Sign    Worried About Running Out of Food in the Last Year: Sometimes true    Ran Out of Food in the Last Year: Sometimes true  Transportation Needs: No Transportation Needs (04/17/2023)   PRAPARE - Administrator, Civil Service (Medical): No    Lack of Transportation (Non-Medical): No  Physical Activity: Not on file  Stress: Not on file  Social Connections: Unknown (11/18/2021)   [facility] from [facility]   Social Network    Social Network: Not on file  Intimate Partner Violence: Not At Risk (04/17/2023)   Humiliation, Afraid, Rape, and Kick questionnaire    Fear of Current or Ex-Partner: No    Emotionally Abused: No    Physically Abused: No    Sexually Abused: No      Review of Systems  Constitutional:  Positive for fatigue. Negative for chills and unexpected weight change.  HENT:  Negative for congestion, postnasal drip, rhinorrhea, sneezing and sore throat.   Eyes:  Negative for redness.  Respiratory:  Negative for cough, chest tightness and shortness of breath.   Cardiovascular:  Negative for chest pain and palpitations.  Gastrointestinal:  Negative for abdominal pain, anal bleeding, constipation, diarrhea, nausea and vomiting.  Genitourinary:  Negative for dysuria and frequency.  Musculoskeletal:  Positive for arthralgias, back pain, gait problem and myalgias. Negative for joint swelling and neck pain.  Skin:  Negative for rash.  Neurological:  Positive for weakness and numbness. Negative for tremors.  Hematological:  Negative for adenopathy. Does not bruise/bleed easily.  Psychiatric/Behavioral:  Positive for behavioral problems (Depression) and sleep disturbance. Negative for self-injury and suicidal ideas. The patient is nervous/anxious.     Vital Signs: BP 136/82   Pulse 97   Temp 98.7 F (37.1 C)   Resp 16   Ht 6\' 2"  (1.88 m)   Wt (!) 311 lb 9.6 oz (141.3 kg)   SpO2 95%   BMI 40.01 kg/m    Physical  Exam Vitals and nursing note reviewed.  Constitutional:      General: He is not in acute distress.    Appearance: Normal appearance. He is well-developed. He is obese. He is not diaphoretic.  HENT:     Head: Normocephalic and atraumatic.  Neck:     Thyroid : No thyromegaly.     Vascular: No JVD.     Trachea: No tracheal deviation.  Cardiovascular:     Rate  and Rhythm: Normal rate and regular rhythm.     Heart sounds: Normal heart sounds. No murmur heard.    No friction rub. No gallop.  Pulmonary:     Effort: Pulmonary effort is normal. No respiratory distress.     Breath sounds: No wheezing or rales.  Chest:     Chest wall: No tenderness.  Musculoskeletal:     Cervical back: Normal range of motion and neck supple.  Lymphadenopathy:     Cervical: No cervical adenopathy.  Skin:    General: Skin is warm and dry.  Neurological:     Mental Status: He is alert and oriented to person, place, and time.     Cranial Nerves: No cranial nerve deficit.     Sensory: Sensory deficit present.     Gait: Gait abnormal.  Psychiatric:        Thought Content: Thought content normal.        Judgment: Judgment normal.        Assessment/Plan: 1. Type 2 diabetes mellitus with hyperglycemia, unspecified whether long term insulin  use (HCC) (Primary) Will start insulin , start with 10units may need to increase pending BG response, but want to avoid lows. Will see about GLP1 coverage with new insurance and which is preferred - insulin  degludec (TRESIBA  FLEXTOUCH) 100 UNIT/ML FlexTouch Pen; Inject 10 Units into the skin daily.  Dispense: 3 mL; Refill: 2  2. Essential hypertension Stable, continue current medications  3. History of stroke with current residual effects Following with neurology on Wednesday and undergoing therapy   General Counseling: Gerald [name] understanding of the findings of todays visit and agrees with plan of treatment. I have discussed any further diagnostic evaluation  that may be needed or ordered today. We also reviewed his medications today. he has been encouraged to call the office with any questions or concerns that should arise related to todays visit.    No orders of the defined types were placed in this encounter.   Meds ordered this encounter  Medications   insulin  degludec (TRESIBA  FLEXTOUCH) 100 UNIT/ML FlexTouch Pen    Sig: Inject 10 Units into the skin daily.    Dispense:  3 mL    Refill:  2   DISCONTD: Semaglutide ,0.25 or 0.5MG /DOS, 2 MG/3ML SOPN    Sig: Inject 0.25 mg into the skin daily.    Dispense:  3 mL    Refill:  0    This patient was seen by Taylor Favia, PA-C in collaboration with Dr. Verneta Gone as a part of collaborative care agreement.   Total time spent:30 Minutes Time spent includes review of chart, medications, test results, and follow up plan with the patient.      Dr Fozia M Khan Internal medicine

## 2023-06-03 DIAGNOSIS — G44229 Chronic tension-type headache, not intractable: Secondary | ICD-10-CM | POA: Diagnosis not present

## 2023-06-03 DIAGNOSIS — R269 Unspecified abnormalities of gait and mobility: Secondary | ICD-10-CM | POA: Diagnosis not present

## 2023-06-03 DIAGNOSIS — R208 Other disturbances of skin sensation: Secondary | ICD-10-CM | POA: Diagnosis not present

## 2023-06-03 DIAGNOSIS — R2689 Other abnormalities of gait and mobility: Secondary | ICD-10-CM | POA: Diagnosis not present

## 2023-06-03 DIAGNOSIS — R200 Anesthesia of skin: Secondary | ICD-10-CM | POA: Diagnosis not present

## 2023-06-03 DIAGNOSIS — R202 Paresthesia of skin: Secondary | ICD-10-CM | POA: Diagnosis not present

## 2023-06-03 DIAGNOSIS — R52 Pain, unspecified: Secondary | ICD-10-CM | POA: Diagnosis not present

## 2023-06-03 DIAGNOSIS — I69398 Other sequelae of cerebral infarction: Secondary | ICD-10-CM | POA: Diagnosis not present

## 2023-06-03 DIAGNOSIS — I639 Cerebral infarction, unspecified: Secondary | ICD-10-CM | POA: Diagnosis not present

## 2023-06-03 DIAGNOSIS — R531 Weakness: Secondary | ICD-10-CM | POA: Diagnosis not present

## 2023-06-03 DIAGNOSIS — R519 Headache, unspecified: Secondary | ICD-10-CM | POA: Diagnosis not present

## 2023-06-09 ENCOUNTER — Encounter: Payer: Self-pay | Admitting: Physical Therapy

## 2023-06-09 ENCOUNTER — Ambulatory Visit: Attending: Neurology | Admitting: Physical Therapy

## 2023-06-09 DIAGNOSIS — R269 Unspecified abnormalities of gait and mobility: Secondary | ICD-10-CM | POA: Diagnosis not present

## 2023-06-09 DIAGNOSIS — M2560 Stiffness of unspecified joint, not elsewhere classified: Secondary | ICD-10-CM | POA: Insufficient documentation

## 2023-06-09 DIAGNOSIS — M6281 Muscle weakness (generalized): Secondary | ICD-10-CM | POA: Insufficient documentation

## 2023-06-09 DIAGNOSIS — R2689 Other abnormalities of gait and mobility: Secondary | ICD-10-CM | POA: Diagnosis not present

## 2023-06-13 ENCOUNTER — Other Ambulatory Visit: Payer: Self-pay | Admitting: Physician Assistant

## 2023-06-17 ENCOUNTER — Other Ambulatory Visit: Payer: Self-pay | Admitting: Physician Assistant

## 2023-06-17 DIAGNOSIS — G5793 Unspecified mononeuropathy of bilateral lower limbs: Secondary | ICD-10-CM

## 2023-06-17 NOTE — Therapy (Addendum)
 OUTPATIENT PHYSICAL THERAPY FUNCTIONAL CAPACITY EVALUATION   Patient Name: Gerald [name] MRN: [id number] DOB:Apr 24, 1965, 58 y.o., male Today's Date: 06/09/2023  END OF SESSION:  Treatment 1 of 1.   1301 to 1432  (1 hour 31 minutes) Recert date: 06/10/23    Past Medical History:  Diagnosis Date   Abnormal LFTs    Anxiety    Atypical chest pain    Borderline diabetes    CAD (coronary artery disease)    a.) PCI and stent placement (unknown type) to mLAD and mLCx; date unknown. b.) LHC 09/10/2010: 40% mLAD; no intervention. c.) LHC 09/12/2011: EF 60%; 30% ISR mLAD, 40% dLAD, 30% pLCx, 30% ISR mLCx, 40% mRCA; no intervention. d.) LHC 05/23/2014: EF >55%; 30% p-mLAD; no intervention. e.) LHC 06/11/2016: EF 55-65%; LVEDP norm; 30-40% p-mLAD; no intervention.   Carcinoma (HCC)    Chronic pain syndrome    Depression    Fundic gland polyps of stomach, benign    Gastritis    GERD (gastroesophageal reflux disease)    Hepatic steatosis    Hyperlipidemia    Hypertension    Migraines    Myocardial infarction [facility]) 2008   was told by PCP   Nonischemic cardiomyopathy (HCC)    a.) TTE 09/12/2011: EF 35-45%. b.) LHC 09/12/2011: EF 60%. c.) TTE 06/04/2012: EF 55-60%. d.) LHC 05/23/2014: EF >55%. e.) TTE 06/11/2016: EF 55-60%; G1DD. f.) LHC 06/11/2016: EF 55-65%. g.) TTE 01/08/2018: EF 60-65%.   Obesity    Occlusion and stenosis of bilateral carotid arteries    OSA on CPAP    Osteoarthritis of both knees    Shortness of breath    Squamous cell carcinoma of skin 02/27/2020   left distal lat deltoid - [facility]   Past Surgical History:  Procedure Laterality Date   BONE EXCISION Right 11/21/2020   Procedure: PART EXCISION BONE- PHALANX;  Surgeon: Anell Baptist, DPM;  Location: [facility];  Service: Podiatry;  Laterality: Right;   CARDIAC CATHETERIZATION N/A 09/2011   [facility]; EF 50% with 30% mid LAD stenosis and no obstructive disease.   CARDIAC CATHETERIZATION  09/2010   [facility]; Mid  LAD 40% stenosis; Mid Circumflex:Normal; Mid RCA; Normal   CARDIAC CATHETERIZATION     COLONOSCOPY     COLONOSCOPY WITH PROPOFOL  N/A 04/10/2021   Procedure: COLONOSCOPY WITH PROPOFOL ;  Surgeon: Luke Salaam, MD;  Location: [facility];  Service: Gastroenterology;  Laterality: N/A;   ESOPHAGOGASTRODUODENOSCOPY N/A 04/10/2021   Procedure: ESOPHAGOGASTRODUODENOSCOPY (EGD);  Surgeon: Luke Salaam, MD;  Location: [facility] END[facility]COPY;  Service: Gastroenterology;  Laterality: N/A;   ESOPHAGOGASTRODUODENOSCOPY (EGD) WITH PROPOFOL  N/A 06/10/2017   Procedure: ESOPHAGOGASTRODUODENOSCOPY (EGD) WITH PROPOFOL ;  Surgeon: Luke Salaam, MD;  Location: [facility] ENDOSCOPY;  Service: Gastroenterology;  Laterality: N/A;   ESOPHAGOGASTRODUODENOSCOPY (EGD) WITH PROPOFOL  N/A 01/01/2022   Procedure: ESOPHAGOGASTRODUODENOSCOPY (EGD) WITH PROPOFOL ;  Surgeon: Luke Salaam, MD;  Location: [facility] ENDOSCOPY;  Service: Gastroenterology;  Laterality: N/A;   ESOPHAGOGASTRODUODENOSCOPY (EGD) WITH PROPOFOL  N/A 01/06/2022   Procedure: ESOPHAGOGASTRODUODENOSCOPY (EGD) WITH PROPOFOL ;  Surgeon: Luke Salaam, MD;  Location: [facility] ENDOSCOPY;  Service: Gastroenterology;  Laterality: N/A;   HAMMER TOE SURGERY Right 11/21/2020   Procedure: HAMMERTOE CORRECTION;  Surgeon: Anell Baptist, DPM;  Location: [facility];  Service: Podiatry;  Laterality: Right;   INSERTION OF MESH  04/16/2021   Procedure: INSERTION OF MESH;  Surgeon: Emmalene Hare, MD;  Location: [facility];  Service: General;;   KNEE ARTHROSCOPY WITH MEDIAL MENISECTOMY Right 09/16/2012   Procedure: RIGHT KNEE ARTHROSCOPY WITH MEDIAL AND  LATERAL MENISECTOMY, CHONDROPLASTY;  Surgeon: Ferd Householder, MD;  Location: [facility];  Service: Orthopedics;  Laterality: Right;  RIGHT KNEE SCOPE MEDIAL MENISCECTOMY   LEFT HEART CATH AND CORONARY ANGIOGRAPHY N/A 06/11/2016   Procedure: Left Heart Cath and Coronary Angiography;  Surgeon: Devorah Fonder, MD;  Location: [facility] INVASIVE CV LAB;   Service: Cardiovascular;  Laterality: N/A;   UMBILICAL HERNIA REPAIR N/A 04/16/2021   Procedure: HERNIA REPAIR UMBILICAL ADULT, open;  Surgeon: Emmalene Hare, MD;  Location: [facility];  Service: General;  Laterality: N/A;   Patient Active Problem List   Diagnosis Date Noted   Stroke (HCC) 05/10/2023   CVA (cerebral vascular accident) (HCC) 04/16/2023   Acute CVA (cerebrovascular accident) (HCC) 11/10/2022   OSA (obstructive sleep apnea) 08/18/2022   CPAP use counseling 08/18/2022   Essential hypertension 08/18/2022   Insufficient sleep syndrome 08/18/2022   Melena 01/06/2022   Postoperative abdominal pain 04/24/2021   Mesenteric panniculitis (HCC) 04/24/2021   Orthopnea    Infection of superficial incisional surgical site after procedure    Non-recurrent bilateral inguinal hernia without obstruction or gangrene    Umbilical hernia without obstruction and without gangrene    Impingement syndrome of right shoulder region 11/07/2019   Chest pain 01/08/2018   Gastroesophageal reflux disease without esophagitis 08/23/2017   Urinary tract infection without hematuria 06/14/2017   Generalized anxiety disorder 02/02/2017   Abnormal results of liver function studies 02/02/2017   Circadian rhythm sleep disorder, shift work type 02/02/2017   Acne vulgaris 02/02/2017   Occlusion and stenosis of bilateral carotid arteries 02/02/2017   Neoplasm of uncertain behavior of skin 02/02/2017   Morbid obesity (HCC) 07/07/2016   Atypical chest pain 06/08/2016   Mass of jaw 05/22/2014   Retaining fluid 11/08/2013   Urinary incontinence 03/14/2013   Depression 01/13/2013   Type 2 diabetes mellitus (HCC) 09/14/2012   Nonischemic cardiomyopathy (HCC)    Mixed hyperlipidemia 05/28/2012   Chronic pain syndrome 03/30/2012   HTN (hypertension) 03/30/2012    PCP: Jacques Mattock, PA-C  REFERRING PROVIDER: Bufford Carne, MD  REFERRING DIAG: [place]  THERAPY DIAG:  Muscle weakness  (generalized)  Gait difficulty  Balance disorder  Joint stiffness  Rationale for Evaluation and Treatment: Rehabilitation  ONSET DATE: Chronic  Please note that significant self-limiting behavior influenced test results. Name: Roshawn, Ayala "Eddie" Injury/Onset Date: 11/2022 Evaluation Date: 06/09/2023 Test Start, End, Duration: 1:01 PM, 2:32 PM, 1:31 hours Diagnosis: CVA Height, Weight: 6'2'', 311lb Starting BP, HR, Pain: 129/65, 84 bpm, Pain 8 out of 10 This report summarizes the results of the [employer] Physical Work Animal nutritionist.  This evaluation is substantiated by reliability and validity research conducted at the [employer] [facility] [place]  at [facility] and reported in the Journal of Occupational Medicine, September 1994   Minimal Overall Level of Work: Falls within the [employer] range.  Exerting up to 20 pounds of force occasionally, and/or up to 10 pounds of force frequently, and/or a negligible amount of force constantly (Constantly: activity or condition exist 2/3 or more of the time) to move objects.  Physical demand requirements are in excess of those for Sedentary Work.  Even though the weight lifted may be only a negligible amount, a job should be rated Light Work: (1) when it requires walking or standing to significant degree; or (2) when it requires sitting most of the time but entails pushing and/or pulling of arm or leg controls; and/or (3) when the job requires working at a  production rate pace entailing the constant pushing and/or pulling of materials even though the weight of those materials is negligible.  NOTE: The constant stress and strain of maintaining a production rate pace, especially in an industrial setting, can be and is physically demanding of a worker even though the amount of force exerted is negligible.  Please note that the overall level of work was significantly influenced by the client's self-limiting behavior.  Therefore, the Light level of  work indicates a minimum ability rather than a maximum ability.  A maximum overall level of work cannot be determined at this time due to the self-limiting behavior.  Please see the Task Performance Table for specific abilities.  Tolerance for the 8-Hour Day: Please note that the client has limited standing and walking tolerance for the 8-hour day/40-hour week at the Light level of work. However, due to the self-limiting behavior on these tasks, this represents a minimal ability and not a maximum ability. A maximum ability could not be determined at this time due to the self-limiting behavior. Regardless of this self-limiting performance, the client will be able to alternate among standing and/or walking and other tasks noted in the task performance table to maximize work tolerance to the 8-hour day/40-hour week.  Please note that the tolerance for the 8-hour day was significantly influenced by the client's self-limiting behavior and indicates his minimal rather than his maximal ability.   Self Limiting Behavior: The client self-limited on 21% of the 14 tasks.   Performance on the self-limiting tasks indicates a minimum rather than a maximum ability.  A maximum on these tasks could not be determined due to the self-limiting behavior.   Self-limiting behavior means that the client stopped the task before a maximum effort was reached.  Possible causes of self-limiting behavior include: (1) pain; (2) psychosocial issues such as fear of reinjury, anxiety, or depression; and/or (3) attempts to manipulate test results.  Although it is difficult to determine the causes of self-limiting behavior, our research indicates that motivated clients self-limit on no more than 20% of test items.  If the self-limiting exceeds 20%, then psychosocial and/or motivational factors are affecting test results.     Self Limiting < 20% of tasks = Within normal limits1  Self-Limiting 21% to 33% of tasks = Exceeds normal  limits1  Self-Limiting > 33% of tasks = Significantly exceeds normal limits1 1When compared to a motivated group of patients who participated in research.   SUBJECTIVE PAIN STATEMENTS The client made the following subjective pain statements during the test:  During sled push/pull with 20# weight, pt. reports this "feels like a lot of weight".    Increase c/o back pain with standing tolerance/ overhead reaching tasks.    Persistent headache reported throughout the evaluation.  No improvement in symptoms.  These pain statements were consistent with the observed movement patterns.  PAIN BEHAVIORS AND THEIR IMPACT ON TEST RESULTS The client demonstrated the following pain behaviors during the test:  Pt. overcompensates with R UE/LE during floor to waist box lifting and carrying tasks.    Pt. ambulates with slow L antalgic gait pattern in clinic.  1 episode of L knee buckling while catching it on the edge of TG during carrying tasks.  These pain behaviors were consistent with the observed movement patterns.  These pain behaviors correlated with the client's self-reported pain.  These pain behaviors did not affect test performance  FUNCTIONAL OUTCOME MEASURES LEFS; 8 out of 80 QuickDASH: 79.5% NDI: 72%  self-perceived  complete disability.    BODY MECHANICS AND MOVEMENT PATTERNS The client did not demonstrate safe body mechanics and movement patterns during the test.  Examples of the unsafe body mechanics and movement patterns are:  limited knee flexion  loss of upright posture  balance issues   BRIEF SUMMARY OF MEDICAL HISTORY October 2024/ March 2025 s/p CVA with L UE/LE weakness.   R hand dominant.  Lives in apartment with no stairs.  Lives with wife and no kids.   Wife is on disability for bipolar disorder.   Pt. drives limited due to feeling lightheadedness.  Pt. wearing a heart monitor from Cardiologist due to Tachycardia.   Pt. took a nitro at 4 PM yesterday after having BP 180/115  prior and 140/95 after nitro.  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  Working at [employer] starting 12/24 and stopped working after 2nd CVA.   Applied for SSI in March.  Pt. reports constant headache (7-8/10).   Reports several near falls after 2nd CVA.  Pt. ambulates with use of SPC on R side.  Going to [facility] in [place] (2x/week).   Reports vision issues/ difficulty with memory.    BP: 129/65, HR: 84 bpm, O2 sat: 97%.   Pt. back on insulin .   Blood sugar 140 with fasting.   MEDICATIONS  Medication See list    UPPER EXTREMITY FUNCTION Client has a diagnosis involving the upper extremities.  Ability to tolerate:  Handling Constantly  Fingering Constantly  Reaching at Waist Frequently  Reaching below Waist Frequently  Feeling Constantly Client's right hand is dominant.   BRIEF MUSCULOSKELETAL SCREEN  Cervical AROM WFL (generalized joint stiffness).   B LE AROM WFL (limited L knee extension/ hip flexion in seated posture)- 3/5 MMT on L.  4/5 MMT on R.   Unable to complete tandem gait/ SLS without UE assist.  Grip: L: 15.6#,  R: 71#.   Extra time to stand from chair and requires UE assist on armrests.  TEST LENGTH AND REST BREAKS The test lasted 1:27 hours.  Short pauses of 2-3 minutes between tasks occur while the evaluator is setting up equipment and documenting scores.  No additional rest breaks were taken.    TASK PERFORMANCE  Please note that the results noted below in red italics and marked SL are tasks on which the client self-limited.  On these self-limiting tasks, the performance represents a minimal rather than a maximal performance.  On these tasks we cannot determine a maximum performance. Tasks Client Performance 1 Self-Limiting Reasons Maximum Abilities 1 Floor to waist lift 7 lb Occas.  7 lb Occas. Waist to eye level lift 15 lb Occas.  15 lb Occas. Two handed carrying 12 lb Occas.  12 lb Occas. Pushing 20 lb Occas. 3  20 lb Occas.  3 Pulling 16 lb Occas. 3  16 lb Occas. 3 Sitting Occasionally   Occasionally Standing Occasionally   Occasionally Work arms over head-standing Occasionally-SL Pain ? Work kneeling Unable  Unable Work squatting/crouching Unable  Unable Climbing stairs  Occasionally-SL Fatigue ? Repetitive squatting Never  Never Walking Occasionally-SL  Pain ? Repetitive trunk rotation-standing Unable  Unable Balance on level surfaces Inadequate-SL Pain ? Balance on uneven surfaces Inadequate  Inadequate Balance on beam/scaffold Inadequate  Inadequate Forward Reaching Frequently  Frequently Grip Strength L16  R71 lb  L16  R71 lb  1 Occasionally = up to 1/3 of the day, Frequently = 1/3 to 2/3 of the day, Constantly =  2/3 to the full day.  Frequent lifting = 50% of Occasional; Constant lifting = 20%  of Occasional. 2 D.O.T. The aptitudes:  1 (90-100 percentile), 2 (67-89 percentile), 3 (34-66 percentile), 4 (11-33 percentile), 5 (0-10 percentile). 3 Pounds of force is the amount of force the client exerted during the pushing and pulling tasks.  If pushing or pulling is required for work, the force required for the task should be measured with a force gauge for comparison.     MAJOR AREAS OF DYSFUNCTION  Dynamic Strength  Mobility  Balance  In multiple major areas that span across all aspects of the test  FACTORS UNDERLYING PERFORMANCE  Generalized de-conditioning  Pain in back/ head  Generalized fatigue  EXIT INTERVIEW  There were no changes in musculoskeletal status from beginning to end.  Pain Score: 8  Gait pattern leaving the evaluation is similar as compared to gait pattern used upon arriving for test.  The client was driven to the evaluation.   PLAN:  PT FREQUENCY: 1x/week   PLANNED INTERVENTIONS: 97750- Physical Performance Testing, 97110-Therapeutic exercises, 97530- Therapeutic activity, [id number]- Neuromuscular re-education, and 29562- Self Care.  PLAN FOR NEXT SESSION: FCE report  faxed to MD office   Lendell Quarry, PT, DPT # [phone] 06/09/2023, 6:33 PM

## 2023-06-23 ENCOUNTER — Other Ambulatory Visit: Payer: Self-pay

## 2023-06-23 MED ORDER — BD PEN NEEDLE NANO U/F 32G X 4 MM MISC: 1 refills | Status: AC

## 2023-06-26 ENCOUNTER — Ambulatory Visit: Admitting: Urology

## 2023-06-26 VITALS — BP 148/90 | HR 85

## 2023-06-26 DIAGNOSIS — R972 Elevated prostate specific antigen [PSA]: Secondary | ICD-10-CM

## 2023-06-26 DIAGNOSIS — N3281 Overactive bladder: Secondary | ICD-10-CM

## 2023-06-26 DIAGNOSIS — R399 Unspecified symptoms and signs involving the genitourinary system: Secondary | ICD-10-CM

## 2023-06-26 LAB — MICROSCOPIC EXAMINATION
Epithelial Cells (non renal): >10 /HPF — AB (ref 0–10)
RBC, Urine: NONE SEEN /HPF (ref 0–2)

## 2023-06-26 LAB — URINALYSIS, COMPLETE
Bilirubin, UA: NEGATIVE
Ketones, UA: NEGATIVE
Leukocytes,UA: NEGATIVE
Nitrite, UA: NEGATIVE
Protein,UA: NEGATIVE
RBC, UA: NEGATIVE
Specific Gravity, UA: 1.02 (ref 1.005–1.030)
Urobilinogen, Ur: 0.2 mg/dL (ref 0.2–1.0)
pH, UA: 6 (ref 5.0–7.5)

## 2023-06-26 LAB — BLADDER SCAN AMB NON-IMAGING: Scan Result: 2

## 2023-06-26 MED ORDER — GEMTESA 75 MG PO TABS: 75.0000 mg | ORAL_TABLET | Freq: Every day | ORAL | 3 refills | Status: AC

## 2023-06-26 NOTE — Addendum Note (Signed)
 Addended by: Lendell Quarry on: 06/26/2023 02:23 PM   Modules accepted: Orders

## 2023-06-26 NOTE — Progress Notes (Unsigned)
 06/26/2023 9:13 AM   Gerald [name] [birth year] [id number]  Referring provider: Jacques Mattock, PA-C [address] [place],  [place] [address]  Urological history: 1. ED -     No chief complaint on file.   HPI: Gerald [name] is a 58 y.o. man who presents today for frequency and urgency to urinate.    Previous records reviewed.   I PSS 26/4  PVR 2 mL   Today's UA yellow clear, specific gravity 1.020, pH 6.0, 1+ glucose, 0-5 WBC's, > 10 epithelial cells and few bacteria.   IPSS     Row Name 06/26/23 0900         International Prostate Symptom Score   How often have you had the sensation of not emptying your bladder? More than half the time     How often have you had to urinate less than every two hours? Almost always     How often have you found you stopped and started again several times when you urinated? More than half the time     How often have you found it difficult to postpone urination? Almost always     How often have you had a weak urinary stream? About half the time     How often have you had to strain to start urination? Not at All     How many times did you typically get up at night to urinate? 5 Times     Total IPSS Score 26       Quality of Life due to urinary symptoms   If you were to spend the rest of your life with your urinary condition just the way it is now how would you feel about that? Mostly Disatisfied              Score:  1-7 Mild 8-19 Moderate 20-35 Severe   PMH: Past Medical History:  Diagnosis Date   Abnormal LFTs    Anxiety    Atypical chest pain    Borderline diabetes    CAD (coronary artery disease)    a.) PCI and stent placement (unknown type) to mLAD and mLCx; date unknown. b.) LHC 09/10/2010: 40% mLAD; no intervention. c.) LHC 09/12/2011: EF 60%; 30% ISR mLAD, 40% dLAD, 30% pLCx, 30% ISR mLCx, 40% mRCA; no intervention. d.) LHC 05/23/2014: EF >55%; 30% p-mLAD; no intervention. e.) LHC  06/11/2016: EF 55-65%; LVEDP norm; 30-40% p-mLAD; no intervention.   Carcinoma (HCC)    Chronic pain syndrome    Depression    Fundic gland polyps of stomach, benign    Gastritis    GERD (gastroesophageal reflux disease)    Hepatic steatosis    Hyperlipidemia    Hypertension    Migraines    Myocardial infarction [facility]) 2008   was told by PCP   Nonischemic cardiomyopathy (HCC)    a.) TTE 09/12/2011: EF 35-45%. b.) LHC 09/12/2011: EF 60%. c.) TTE 06/04/2012: EF 55-60%. d.) LHC 05/23/2014: EF >55%. e.) TTE 06/11/2016: EF 55-60%; G1DD. f.) LHC 06/11/2016: EF 55-65%. g.) TTE 01/08/2018: EF 60-65%.   Obesity    Occlusion and stenosis of bilateral carotid arteries    OSA on CPAP    Osteoarthritis of both knees    Shortness of breath    Squamous cell carcinoma of skin 02/27/2020   left distal lat deltoid - [facility]    Surgical History: Past Surgical History:  Procedure Laterality Date   BONE EXCISION Right 11/21/2020   Procedure: PART EXCISION  BONE- PHALANX;  Surgeon: Anell Baptist, DPM;  Location: [facility];  Service: Podiatry;  Laterality: Right;   CARDIAC CATHETERIZATION N/A 09/2011   [facility]; EF 50% with 30% mid LAD stenosis and no obstructive disease.   CARDIAC CATHETERIZATION  09/2010   [facility]; Mid LAD 40% stenosis; Mid Circumflex:Normal; Mid RCA; Normal   CARDIAC CATHETERIZATION     COLONOSCOPY     COLONOSCOPY WITH PROPOFOL  N/A 04/10/2021   Procedure: COLONOSCOPY WITH PROPOFOL ;  Surgeon: Luke Salaam, MD;  Location: [facility] ENDOSCOPY;  Service: Gastroenterology;  Laterality: N/A;   ESOPHAGOGASTRODUODENOSCOPY N/A 04/10/2021   Procedure: ESOPHAGOGASTRODUODENOSCOPY (EGD);  Surgeon: Luke Salaam, MD;  Location: [facility] ENDOSCOPY;  Service: Gastroenterology;  Laterality: N/A;   ESOPHAGOGASTRODUODENOSCOPY (EGD) WITH PROPOFOL  N/A 06/10/2017   Procedure: ESOPHAGOGASTRODUODENOSCOPY (EGD) WITH PROPOFOL ;  Surgeon: Luke Salaam, MD;  Location: [facility] END[facility]COPY;  Service: Gastroenterology;  Laterality: N/A;    ESOPHAGOGASTRODUODENOSCOPY (EGD) WITH PROPOFOL  N/A 01/01/2022   Procedure: ESOPHAGOGASTRODUODENOSCOPY (EGD) WITH PROPOFOL ;  Surgeon: Luke Salaam, MD;  Location: [facility] ENDOSCOPY;  Service: Gastroenterology;  Laterality: N/A;   ESOPHAGOGASTRODUODENOSCOPY (EGD) WITH PROPOFOL  N/A 01/06/2022   Procedure: ESOPHAGOGASTRODUODENOSCOPY (EGD) WITH PROPOFOL ;  Surgeon: Luke Salaam, MD;  Location: [facility] ENDOSCOPY;  Service: Gastroenterology;  Laterality: N/A;   HAMMER TOE SURGERY Right 11/21/2020   Procedure: HAMMERTOE CORRECTION;  Surgeon: Anell Baptist, DPM;  Location: [facility];  Service: Podiatry;  Laterality: Right;   INSERTION OF MESH  04/16/2021   Procedure: INSERTION OF MESH;  Surgeon: Emmalene Hare, MD;  Location: [facility] ORS;  Service: General;;   KNEE ARTHROSCOPY WITH MEDIAL MENISECTOMY Right 09/16/2012   Procedure: RIGHT KNEE ARTHROSCOPY WITH MEDIAL AND LATERAL MENISECTOMY, CHONDROPLASTY;  Surgeon: Ferd Householder, MD;  Location: [facility];  Service: Orthopedics;  Laterality: Right;  RIGHT KNEE SCOPE MEDIAL MENISCECTOMY   LEFT HEART CATH AND CORONARY ANGIOGRAPHY N/A 06/11/2016   Procedure: Left Heart Cath and Coronary Angiography;  Surgeon: Devorah Fonder, MD;  Location: [facility] INVASIVE CV LAB;  Service: Cardiovascular;  Laterality: N/A;   UMBILICAL HERNIA REPAIR N/A 04/16/2021   Procedure: HERNIA REPAIR UMBILICAL ADULT, open;  Surgeon: Emmalene Hare, MD;  Location: [facility] ORS;  Service: General;  Laterality: N/A;    Home Medications:  Allergies as of 06/26/2023       Reactions   Nexlizet  [bempedoic Acid-ezetimibe ] Rash   Statins Hives, Rash   For high dose statin        Medication List        Accurate as of Jun 26, 2023  9:13 AM. If you have any questions, ask your nurse or doctor.          aspirin  EC 81 MG tablet Take 1 tablet (81 mg total) by mouth daily. Swallow whole.   BD Pen Needle Nano U/F 32G X 4 MM Misc Generic drug: Insulin  Pen Needle Use as  directed once a daily   citalopram  40 MG tablet Commonly known as: CELEXA  TAKE 1 TABLET BY MOUTH EVERY DAY   clopidogrel  75 MG tablet Commonly known as: PLAVIX  Take 1 tablet (75 mg total) by mouth daily.   cyclobenzaprine  10 MG tablet Commonly known as: FLEXERIL  TAKE 1 TABLET (10 MG TOTAL) BY MOUTH AT BEDTIME FOR BACK SPASM AS NEEDED ONLY   furosemide  20 MG tablet Commonly known as: LASIX  TAKE 1 TABLET BY MOUTH EVERY DAY AS NEEDED FOR LEG SWELLING   gabapentin  300 MG capsule Commonly known as: NEURONTIN  TAKE 1 CAPSULE BY MOUTH THREE TIMES A DAY   lamoTRIgine   25 MG tablet Commonly known as: LAMICTAL  Take 1 tablet (25 mg total) by mouth at bedtime for 14 days, THEN 1 tablet (25 mg total) 2 (two) times daily for 14 days. Start taking on: April 18, 2023   lisinopril -hydrochlorothiazide  20-25 MG tablet Commonly known as: ZESTORETIC  TAKE 1 TABLET BY MOUTH EVERY DAY   metoprolol  tartrate 100 MG tablet Commonly known as: LOPRESSOR  TAKE 1 TABLET 2 HR PRIOR TO CARDIAC PROCEDURE   MULTIVITAMIN ADULTS PO Take 1 tablet by mouth daily.   nitroGLYCERIN  0.4 MG SL tablet Commonly known as: NITROSTAT  Place 1 tablet (0.4 mg total) under the tongue every 5 (five) minutes x 3 doses as needed for chest pain.   nortriptyline 10 MG capsule Commonly known as: PAMELOR Take 10 mg by mouth at bedtime.   Repatha  SureClick 140 MG/ML Soaj Generic drug: Evolocumab  Inject 140 mg into the skin every 14 (fourteen) days.   rosuvastatin  5 MG tablet Commonly known as: CRESTOR  Take 1 tablet (5 mg total) by mouth at bedtime.   Tresiba  FlexTouch 100 UNIT/ML FlexTouch Pen Generic drug: insulin  degludec Inject 10 Units into the skin daily.   Trulicity  0.75 MG/0.5ML Soaj Generic drug: Dulaglutide  Inject 0.75 mg into the skin once a week.        Allergies:  Allergies  Allergen Reactions   Nexlizet  [Bempedoic Acid-Ezetimibe ] Rash   Statins Hives and Rash    For high dose statin    Family  History: Family History  Problem Relation Age of Onset   Heart disease Father 59       MI   Heart attack Father    Stomach cancer Father    Hypertension Mother    Breast cancer Mother     Social History:  reports that he has never smoked. He has been exposed to tobacco smoke. He quit smokeless tobacco use about 38 years ago.  His smokeless tobacco use included chew. He reports that he does not currently use alcohol. He reports that he does not use drugs.  ROS: Pertinent ROS in HPI  Physical Exam: There were no vitals taken for this visit.  Constitutional:  Well nourished. Alert and oriented, No acute distress. HEENT: [place] AT, moist mucus membranes.  Trachea midline, no masses. Cardiovascular: No clubbing, cyanosis, or edema. Respiratory: Normal respiratory effort, no increased work of breathing. GI: Abdomen is soft, non tender, non distended, no abdominal masses. Liver and spleen not palpable.  No hernias appreciated.  Stool sample for occult testing is not indicated.   GU: No CVA tenderness.  No bladder fullness or masses.  Patient with circumcised/uncircumcised phallus. ***Foreskin easily retracted***  Urethral meatus is patent.  No penile discharge. No penile lesions or rashes. Scrotum without lesions, cysts, rashes and/or edema.  Testicles are located scrotally bilaterally. No masses are appreciated in the testicles. Left and right epididymis are normal. Rectal: Patient with  normal sphincter tone. Anus and perineum without scarring or rashes. No rectal masses are appreciated. Prostate is approximately *** grams, *** nodules are appreciated. Seminal vesicles are normal. Skin: No rashes, bruises or suspicious lesions. Lymph: No cervical or inguinal adenopathy. Neurologic: Grossly intact, no focal deficits, moving all 4 extremities. Psychiatric: Normal mood and affect.  Laboratory Data: Lab Results  Component Value Date   WBC 11.8 (H) 05/15/2023   HGB 14.1 05/15/2023   HCT 40.5  05/15/2023   MCV 85.3 05/15/2023   PLT 229 05/15/2023    Lab Results  Component Value Date   CREATININE 0.87 05/19/2023  Lab Results  Component Value Date   PSA 2.21 11/08/2013    Lab Results  Component Value Date   TESTOSTERONE  81 (L) 04/14/2022    Lab Results  Component Value Date   HGBA1C 9.1 (A) 04/13/2023    Lab Results  Component Value Date   TSH 3.030 09/08/2022       Component Value Date/Time   CHOL 155 05/11/2023 0150   CHOL 214 (H) 09/08/2022 1030   CHOL 205 (H) 04/01/2021 1335   HDL 35 (L) 05/11/2023 0150   HDL 32 (L) 09/08/2022 1030   HDL 34 (L) 09/13/2011 0307   CHOLHDL 4.4 05/11/2023 0150   VLDL 44 (H) 05/11/2023 0150   VLDL 27 09/13/2011 0307   LDLCALC 76 05/11/2023 0150   LDLCALC 120 (H) 09/08/2022 1030   LDLCALC 115 (H) 09/13/2011 0307    Lab Results  Component Value Date   AST 20 05/11/2023   Lab Results  Component Value Date   ALT 30 05/11/2023   No components found for: "ALKALINEPHOPHATASE" No components found for: "BILIRUBINTOTAL"  No results found for: "ESTRADIOL"  Urinalysis See EPIC and HPI  I have reviewed the labs.   Pertinent Imaging:  06/26/23 09:26  Scan Result 2 ml    Assessment & Plan:  ***  1. Elevated PSA (Primary) *** - BLADDER SCAN AMB NON-IMAGING - Urinalysis, Complete  2. Lower urinary tract symptoms (LUTS) *** - BLADDER SCAN AMB NON-IMAGING - Urinalysis, Complete   No follow-ups on file.  These notes generated with voice recognition software. I apologize for typographical errors.  Briant Camper  [facility] [address]  [address] [place], [place] [address] [phone]

## 2023-06-30 ENCOUNTER — Encounter: Payer: Self-pay | Admitting: Urology

## 2023-07-01 ENCOUNTER — Ambulatory Visit: Payer: Self-pay | Admitting: Urology

## 2023-07-01 LAB — CULTURE, URINE COMPREHENSIVE

## 2023-07-03 ENCOUNTER — Other Ambulatory Visit: Payer: Self-pay

## 2023-07-03 ENCOUNTER — Encounter: Payer: Self-pay | Admitting: Physician Assistant

## 2023-07-03 ENCOUNTER — Ambulatory Visit (INDEPENDENT_AMBULATORY_CARE_PROVIDER_SITE_OTHER): Admitting: Physician Assistant

## 2023-07-03 ENCOUNTER — Telehealth: Payer: Self-pay

## 2023-07-03 VITALS — BP 122/85 | HR 79 | Temp 98.0°F | Resp 16 | Ht 74.0 in | Wt 311.6 lb

## 2023-07-03 DIAGNOSIS — I10 Essential (primary) hypertension: Secondary | ICD-10-CM | POA: Diagnosis not present

## 2023-07-03 DIAGNOSIS — I6930 Unspecified sequelae of cerebral infarction: Secondary | ICD-10-CM | POA: Diagnosis not present

## 2023-07-03 DIAGNOSIS — E1165 Type 2 diabetes mellitus with hyperglycemia: Secondary | ICD-10-CM

## 2023-07-03 NOTE — Progress Notes (Cosign Needed)
 [facility] [address] [place], [place] [address]  Internal MEDICINE  Office Visit Note  Patient Name: Gerald [name]  [id number]  [id number]  Date of Service: 07/03/2023  Chief Complaint  Patient presents with   Follow-up   Depression   Gastroesophageal Reflux   Hypertension   Hyperlipidemia   Diabetes    HPI Pt is here for routine follow up -BG 150-250 throughout the day -Taking 10units insulin , never got GLP1. Will increase to 14-18units daily.  -States he only has 1 month left PT due to insurance -did have a fall last week doing exercises at home, did not hit head and not complaining of any injuries -is on repatha  now -seeing neurology June 12th, still having headaches and numbness -had functional capacity assessment -trying to make a lap around building daily now  Current Medication: Outpatient Encounter Medications as of 07/03/2023  Medication Sig   aspirin  EC 81 MG tablet Take 1 tablet (81 mg total) by mouth daily. Swallow whole.   citalopram  (CELEXA ) 40 MG tablet TAKE 1 TABLET BY MOUTH EVERY DAY   clopidogrel  (PLAVIX ) 75 MG tablet Take 1 tablet (75 mg total) by mouth daily.   cyclobenzaprine  (FLEXERIL ) 10 MG tablet TAKE 1 TABLET (10 MG TOTAL) BY MOUTH AT BEDTIME FOR BACK SPASM AS NEEDED ONLY   Dulaglutide  (TRULICITY ) 0.75 MG/0.5ML SOAJ Inject 0.75 mg into the skin once a week.   Evolocumab  (REPATHA  SURECLICK) 140 MG/ML SOAJ Inject 140 mg into the skin every 14 (fourteen) days.   furosemide  (LASIX ) 20 MG tablet TAKE 1 TABLET BY MOUTH EVERY DAY AS NEEDED FOR LEG SWELLING   gabapentin  (NEURONTIN ) 300 MG capsule TAKE 1 CAPSULE BY MOUTH THREE TIMES A DAY   insulin  degludec (TRESIBA  FLEXTOUCH) 100 UNIT/ML FlexTouch Pen Inject 10 Units into the skin daily.   Insulin  Pen Needle (BD PEN NEEDLE NANO U/F) 32G X 4 MM MISC Use as directed once a daily   lisinopril -hydrochlorothiazide  (ZESTORETIC ) 20-25 MG tablet TAKE 1 TABLET BY MOUTH EVERY DAY    metoprolol  tartrate (LOPRESSOR ) 100 MG tablet TAKE 1 TABLET 2 HR PRIOR TO CARDIAC PROCEDURE   Multiple Vitamins-Minerals (MULTIVITAMIN ADULTS PO) Take 1 tablet by mouth daily.   nitroGLYCERIN  (NITROSTAT ) 0.4 MG SL tablet Place 1 tablet (0.4 mg total) under the tongue every 5 (five) minutes x 3 doses as needed for chest pain.   nortriptyline (PAMELOR) 10 MG capsule Take 10 mg by mouth at bedtime.   rosuvastatin  (CRESTOR ) 5 MG tablet Take 1 tablet (5 mg total) by mouth at bedtime.   Vibegron  (GEMTESA ) 75 MG TABS Take 1 tablet (75 mg total) by mouth daily.   lamoTRIgine  (LAMICTAL ) 25 MG tablet Take 1 tablet (25 mg total) by mouth at bedtime for 14 days, THEN 1 tablet (25 mg total) 2 (two) times daily for 14 days.   No facility-administered encounter medications on file as of 07/03/2023.    Surgical History: Past Surgical History:  Procedure Laterality Date   BONE EXCISION Right 11/21/2020   Procedure: PART EXCISION BONE- PHALANX;  Surgeon: Anell Baptist, DPM;  Location: [facility];  Service: Podiatry;  Laterality: Right;   CARDIAC CATHETERIZATION N/A 09/2011   [facility]; EF 50% with 30% mid LAD stenosis and no obstructive disease.   CARDIAC CATHETERIZATION  09/2010   [facility]; Mid LAD 40% stenosis; Mid Circumflex:Normal; Mid RCA; Normal   CARDIAC CATHETERIZATION     COLONOSCOPY     COLONOSCOPY WITH PROPOFOL  N/A 04/10/2021   Procedure: COLONOSCOPY WITH  PROPOFOL ;  Surgeon: Luke Salaam, MD;  Location: [facility] END[facility]COPY;  Service: Gastroenterology;  Laterality: N/A;   ESOPHAGOGASTRODUODENOSCOPY N/A 04/10/2021   Procedure: ESOPHAGOGASTRODUODENOSCOPY (EGD);  Surgeon: Luke Salaam, MD;  Location: [facility] ENDOSCOPY;  Service: Gastroenterology;  Laterality: N/A;   ESOPHAGOGASTRODUODENOSCOPY (EGD) WITH PROPOFOL  N/A 06/10/2017   Procedure: ESOPHAGOGASTRODUODENOSCOPY (EGD) WITH PROPOFOL ;  Surgeon: Luke Salaam, MD;  Location: [facility] END[facility]COPY;  Service: Gastroenterology;  Laterality: N/A;   ESOPHAGOGASTRODUODENOSCOPY  (EGD) WITH PROPOFOL  N/A 01/01/2022   Procedure: ESOPHAGOGASTRODUODENOSCOPY (EGD) WITH PROPOFOL ;  Surgeon: Luke Salaam, MD;  Location: [facility] ENDOSCOPY;  Service: Gastroenterology;  Laterality: N/A;   ESOPHAGOGASTRODUODENOSCOPY (EGD) WITH PROPOFOL  N/A 01/06/2022   Procedure: ESOPHAGOGASTRODUODENOSCOPY (EGD) WITH PROPOFOL ;  Surgeon: Luke Salaam, MD;  Location: [facility];  Service: Gastroenterology;  Laterality: N/A;   HAMMER TOE SURGERY Right 11/21/2020   Procedure: HAMMERTOE CORRECTION;  Surgeon: Anell Baptist, DPM;  Location: [facility];  Service: Podiatry;  Laterality: Right;   INSERTION OF MESH  04/16/2021   Procedure: INSERTION OF MESH;  Surgeon: Emmalene Hare, MD;  Location: [facility] ORS;  Service: General;;   KNEE ARTHROSCOPY WITH MEDIAL MENISECTOMY Right 09/16/2012   Procedure: RIGHT KNEE ARTHROSCOPY WITH MEDIAL AND LATERAL MENISECTOMY, CHONDROPLASTY;  Surgeon: Ferd Householder, MD;  Location: [facility];  Service: Orthopedics;  Laterality: Right;  RIGHT KNEE SCOPE MEDIAL MENISCECTOMY   LEFT HEART CATH AND CORONARY ANGIOGRAPHY N/A 06/11/2016   Procedure: Left Heart Cath and Coronary Angiography;  Surgeon: Devorah Fonder, MD;  Location: [facility] INVASIVE CV LAB;  Service: Cardiovascular;  Laterality: N/A;   UMBILICAL HERNIA REPAIR N/A 04/16/2021   Procedure: HERNIA REPAIR UMBILICAL ADULT, open;  Surgeon: Emmalene Hare, MD;  Location: [facility] ORS;  Service: General;  Laterality: N/A;    Medical History: Past Medical History:  Diagnosis Date   Abnormal LFTs    Anxiety    Atypical chest pain    Borderline diabetes    CAD (coronary artery disease)    a.) PCI and stent placement (unknown type) to mLAD and mLCx; date unknown. b.) LHC 09/10/2010: 40% mLAD; no intervention. c.) LHC 09/12/2011: EF 60%; 30% ISR mLAD, 40% dLAD, 30% pLCx, 30% ISR mLCx, 40% mRCA; no intervention. d.) LHC 05/23/2014: EF >55%; 30% p-mLAD; no intervention. e.) LHC 06/11/2016: EF 55-65%; LVEDP norm; 30-40%  p-mLAD; no intervention.   Carcinoma (HCC)    Chronic pain syndrome    Depression    Diabetes mellitus without complication (HCC)    Fundic gland polyps of stomach, benign    Gastritis    GERD (gastroesophageal reflux disease)    Hepatic steatosis    Hyperlipidemia    Hypertension    Migraines    Myocardial infarction [facility]) 2008   was told by PCP   Nonischemic cardiomyopathy (HCC)    a.) TTE 09/12/2011: EF 35-45%. b.) LHC 09/12/2011: EF 60%. c.) TTE 06/04/2012: EF 55-60%. d.) LHC 05/23/2014: EF >55%. e.) TTE 06/11/2016: EF 55-60%; G1DD. f.) LHC 06/11/2016: EF 55-65%. g.) TTE 01/08/2018: EF 60-65%.   Obesity    Occlusion and stenosis of bilateral carotid arteries    OSA on CPAP    Osteoarthritis of both knees    Shortness of breath    Squamous cell carcinoma of skin 02/27/2020   left distal lat deltoid - [facility]    Family History: Family History  Problem Relation Age of Onset   Heart disease Father 40       MI   Heart attack Father    Stomach cancer Father  Hypertension Mother    Breast cancer Mother     Social History   Socioeconomic History   Marital status: Married    Spouse name: Not on file   Number of children: 3   Years of education: Not on file   Highest education level: Not on file  Occupational History   Occupation: Camera operator  Tobacco Use   Smoking status: Never    Passive exposure: Past   Smokeless tobacco: Former    Types: Chew    Quit date: 1987  Vaping Use   Vaping status: Never Used  Substance and Sexual Activity   Alcohol use: Not Currently   Drug use: No   Sexual activity: Yes  Other Topics Concern   Not on file  Social History Narrative   Married.  4 yo son.   Wife on disability for bipolar disorder.     Social Drivers of Corporate investment banker Strain: Not on file  Food Insecurity: Food Insecurity Present (04/17/2023)   Hunger Vital Sign    Worried About Running Out of Food in the Last Year: Sometimes true    Ran Out of  Food in the Last Year: Sometimes true  Transportation Needs: No Transportation Needs (04/17/2023)   PRAPARE - Administrator, Civil Service (Medical): No    Lack of Transportation (Non-Medical): No  Physical Activity: Not on file  Stress: Not on file  Social Connections: Unknown (11/18/2021)   Received [facility]   Social Network    Social Network: Not on file  Intimate Partner Violence: Not At Risk (04/17/2023)   Humiliation, Afraid, Rape, and Kick questionnaire    Fear of Current or Ex-Partner: No    Emotionally Abused: No    Physically Abused: No    Sexually Abused: No      Review of Systems  Constitutional:  Positive for fatigue. Negative for chills and unexpected weight change.  HENT:  Negative for congestion, postnasal drip, rhinorrhea, sneezing and sore throat.   Eyes:  Negative for redness.  Respiratory:  Negative for cough, chest tightness and shortness of breath.   Cardiovascular:  Negative for chest pain and palpitations.  Gastrointestinal:  Negative for abdominal pain, anal bleeding, constipation, diarrhea, nausea and vomiting.  Genitourinary:  Negative for dysuria and frequency.  Musculoskeletal:  Positive for arthralgias, back pain, gait problem and myalgias. Negative for joint swelling and neck pain.  Skin:  Negative for rash.  Neurological:  Positive for weakness and numbness. Negative for tremors.  Hematological:  Negative for adenopathy. Does not bruise/bleed easily.  Psychiatric/Behavioral:  Positive for behavioral problems (Depression) and sleep disturbance. Negative for self-injury and suicidal ideas. The patient is nervous/anxious.     Vital Signs: BP 122/85   Pulse 79   Temp 98 F (36.7 C)   Resp 16   Ht 6' 2 (1.88 m)   Wt (!) 311 lb 9.6 oz (141.3 kg)   SpO2 97%   BMI 40.01 kg/m    Physical Exam Vitals and nursing note reviewed.  Constitutional:      General: He is not in acute distress.    Appearance: Normal appearance. He  is well-developed. He is obese. He is not diaphoretic.  HENT:     Head: Normocephalic and atraumatic.  Neck:     Thyroid : No thyromegaly.     Vascular: No JVD.     Trachea: No tracheal deviation.   Cardiovascular:     Rate and Rhythm: Normal rate and  regular rhythm.     Heart sounds: Normal heart sounds. No murmur heard.    No friction rub. No gallop.  Pulmonary:     Effort: Pulmonary effort is normal. No respiratory distress.     Breath sounds: No wheezing or rales.  Chest:     Chest wall: No tenderness.   Musculoskeletal:     Cervical back: Normal range of motion and neck supple.  Lymphadenopathy:     Cervical: No cervical adenopathy.   Skin:    General: Skin is warm and dry.   Neurological:     Mental Status: He is alert and oriented to person, place, and time.     Cranial Nerves: No cranial nerve deficit.     Sensory: Sensory deficit present.     Gait: Gait abnormal.   Psychiatric:        Thought Content: Thought content normal.        Judgment: Judgment normal.        Assessment/Plan: 1. Type 2 diabetes mellitus with hyperglycemia, unspecified whether long term insulin  use (HCC) (Primary) Will increase insulin , will try for GLP1 approval again, previously did well with this. May consider adding SGLT2 in future as well - Urine Microalbumin w/creat. ratio  2. Essential hypertension Stable, continue current medication  3. History of stroke with current residual effects Followed by neurology   General Counseling: Amber Juniper understanding of the findings of todays visit and agrees with plan of treatment. I have discussed any further diagnostic evaluation that may be needed or ordered today. We also reviewed his medications today. he has been encouraged to call the office with any questions or concerns that should arise related to todays visit.    Orders Placed This Encounter  Procedures   Urine Microalbumin w/creat. ratio    No orders of the  defined types were placed in this encounter.   This patient was seen by Taylor Favia, PA-C in collaboration with Dr. Verneta Gone as a part of collaborative care agreement.   Total time spent:30 Minutes Time spent includes review of chart, medications, test results, and follow up plan with the patient.      Dr Fozia M Khan Internal medicine

## 2023-07-03 NOTE — Telephone Encounter (Signed)
Completed P.A. for patient's Ozempic. 

## 2023-07-06 ENCOUNTER — Telehealth: Payer: Self-pay

## 2023-07-06 NOTE — Telephone Encounter (Signed)
 Faxed appeal for patient's Ozempic .

## 2023-07-07 ENCOUNTER — Other Ambulatory Visit: Payer: Self-pay | Admitting: Physician Assistant

## 2023-07-07 ENCOUNTER — Telehealth: Payer: Self-pay

## 2023-07-07 NOTE — Telephone Encounter (Addendum)
 Pt advised that we add him on schedule for tomorrow with alyssa if his glucose get more higher than 400 go to ED as per lauren

## 2023-07-07 NOTE — Telephone Encounter (Signed)
 As per lauren advised to increase he can increase to between tresiba   20-24 units. she may discuss sliding scale with him if we can't get oral meds or ozempic  approved. I think we could try for jardiance . Can see what she thinks tomorrow and we can discuss in detail at visit tomorrow and also advised again that  glucose  is higher than 400 go to ED and also any changes in symptoms blurry eyes dizziness go ED

## 2023-07-08 ENCOUNTER — Encounter: Payer: Self-pay | Admitting: Nurse Practitioner

## 2023-07-08 ENCOUNTER — Ambulatory Visit (INDEPENDENT_AMBULATORY_CARE_PROVIDER_SITE_OTHER): Admitting: Nurse Practitioner

## 2023-07-08 VITALS — BP 120/85 | HR 90 | Temp 98.1°F | Resp 16 | Ht 74.0 in | Wt 314.6 lb

## 2023-07-08 DIAGNOSIS — Z794 Long term (current) use of insulin: Secondary | ICD-10-CM

## 2023-07-08 DIAGNOSIS — R5383 Other fatigue: Secondary | ICD-10-CM | POA: Diagnosis not present

## 2023-07-08 DIAGNOSIS — E1165 Type 2 diabetes mellitus with hyperglycemia: Secondary | ICD-10-CM | POA: Diagnosis not present

## 2023-07-08 DIAGNOSIS — I10 Essential (primary) hypertension: Secondary | ICD-10-CM | POA: Diagnosis not present

## 2023-07-08 MED ORDER — EMPAGLIFLOZIN 25 MG PO TABS: 25.0000 mg | ORAL_TABLET | Freq: Every day | ORAL | 5 refills | Status: DC

## 2023-07-08 NOTE — Progress Notes (Signed)
 [facility] [address] [place], [place] [address]  Internal MEDICINE  Office Visit Note  Patient Name: Gerald [name]  [id number]  [id number]  Date of Service: 07/08/2023  Chief Complaint  Patient presents with   Acute Visit    Glucose high-- 220 this morning      HPI Gerald [name] presents for an acute sick visit for Uncontrolled diabetes  Glucose last night was 410, A1c in march was 9.1 Glucose this mornign was 220. He is only on tresiba  basal insulin  for his glucose. He was on trulicity  as well but his insurance stopped covering the medication.     Current Medication:  Outpatient Encounter Medications as of 07/08/2023  Medication Sig   aspirin  EC 81 MG tablet Take 1 tablet (81 mg total) by mouth daily. Swallow whole.   citalopram  (CELEXA ) 40 MG tablet TAKE 1 TABLET BY MOUTH EVERY DAY   clopidogrel  (PLAVIX ) 75 MG tablet Take 1 tablet (75 mg total) by mouth daily.   cyclobenzaprine  (FLEXERIL ) 10 MG tablet TAKE 1 TABLET (10 MG TOTAL) BY MOUTH AT BEDTIME FOR BACK SPASM AS NEEDED ONLY   Dulaglutide  (TRULICITY ) 0.75 MG/0.5ML SOAJ Inject 0.75 mg into the skin once a week.   empagliflozin  (JARDIANCE ) 25 MG TABS tablet Take 1 tablet (25 mg total) by mouth daily before breakfast.   Evolocumab  (REPATHA  SURECLICK) 140 MG/ML SOAJ Inject 140 mg into the skin every 14 (fourteen) days.   furosemide  (LASIX ) 20 MG tablet TAKE 1 TABLET BY MOUTH EVERY DAY AS NEEDED FOR LEG SWELLING   gabapentin  (NEURONTIN ) 300 MG capsule TAKE 1 CAPSULE BY MOUTH THREE TIMES A DAY   insulin  degludec (TRESIBA  FLEXTOUCH) 100 UNIT/ML FlexTouch Pen Inject 10 Units into the skin daily.   Insulin  Pen Needle (BD PEN NEEDLE NANO U/F) 32G X 4 MM MISC Use as directed once a daily   lisinopril -hydrochlorothiazide  (ZESTORETIC ) 20-25 MG tablet TAKE 1 TABLET BY MOUTH EVERY DAY   metoprolol  tartrate (LOPRESSOR ) 100 MG tablet TAKE 1 TABLET 2 HR PRIOR TO CARDIAC PROCEDURE   Multiple Vitamins-Minerals (MULTIVITAMIN  ADULTS PO) Take 1 tablet by mouth daily.   nitroGLYCERIN  (NITROSTAT ) 0.4 MG SL tablet Place 1 tablet (0.4 mg total) under the tongue every 5 (five) minutes x 3 doses as needed for chest pain.   nortriptyline (PAMELOR) 10 MG capsule Take 10 mg by mouth at bedtime.   rosuvastatin  (CRESTOR ) 5 MG tablet Take 1 tablet (5 mg total) by mouth at bedtime.   Vibegron  (GEMTESA ) 75 MG TABS Take 1 tablet (75 mg total) by mouth daily.   lamoTRIgine  (LAMICTAL ) 25 MG tablet Take 1 tablet (25 mg total) by mouth at bedtime for 14 days, THEN 1 tablet (25 mg total) 2 (two) times daily for 14 days.   No facility-administered encounter medications on file as of 07/08/2023.      Medical History: Past Medical History:  Diagnosis Date   Abnormal LFTs    Anxiety    Atypical chest pain    Borderline diabetes    CAD (coronary artery disease)    a.) PCI and stent placement (unknown type) to mLAD and mLCx; date unknown. b.) LHC 09/10/2010: 40% mLAD; no intervention. c.) LHC 09/12/2011: EF 60%; 30% ISR mLAD, 40% dLAD, 30% pLCx, 30% ISR mLCx, 40% mRCA; no intervention. d.) LHC 05/23/2014: EF >55%; 30% p-mLAD; no intervention. e.) LHC 06/11/2016: EF 55-65%; LVEDP norm; 30-40% p-mLAD; no intervention.   Carcinoma (HCC)    Chronic pain syndrome    Depression  Diabetes mellitus without complication (HCC)    Fundic gland polyps of stomach, benign    Gastritis    GERD (gastroesophageal reflux disease)    Hepatic steatosis    Hyperlipidemia    Hypertension    Migraines    Myocardial infarction [facility]) 2008   was told by PCP   Nonischemic cardiomyopathy (HCC)    a.) TTE 09/12/2011: EF 35-45%. b.) LHC 09/12/2011: EF 60%. c.) TTE 06/04/2012: EF 55-60%. d.) LHC 05/23/2014: EF >55%. e.) TTE 06/11/2016: EF 55-60%; G1DD. f.) LHC 06/11/2016: EF 55-65%. g.) TTE 01/08/2018: EF 60-65%.   Obesity    Occlusion and stenosis of bilateral carotid arteries    OSA on CPAP    Osteoarthritis of both knees    Shortness of breath     Squamous cell carcinoma of skin 02/27/2020   left distal lat deltoid - [facility]     Vital Signs: BP 120/85 Comment: 146/94  Pulse 90   Temp 98.1 F (36.7 C)   Resp 16   Ht 6\' 2"  (1.88 m)   Wt (!) 314 lb 9.6 oz (142.7 kg)   SpO2 96%   BMI 40.39 kg/m    Review of Systems  Constitutional:  Positive for fatigue. Negative for chills and unexpected weight change.  HENT:  Negative for congestion, postnasal drip, rhinorrhea, sneezing and sore throat.   Eyes:  Negative for redness.  Respiratory:  Negative for cough, chest tightness and shortness of breath.   Cardiovascular:  Negative for chest pain and palpitations.  Gastrointestinal:  Negative for abdominal pain, anal bleeding, constipation, diarrhea, nausea and vomiting.  Genitourinary:  Negative for dysuria and frequency.  Musculoskeletal:  Positive for arthralgias, back pain, gait problem and myalgias. Negative for joint swelling and neck pain.  Skin:  Negative for rash.  Neurological:  Positive for weakness and numbness. Negative for tremors.  Hematological:  Negative for adenopathy. Does not bruise/bleed easily.  Psychiatric/Behavioral:  Positive for behavioral problems (Depression) and sleep disturbance. Negative for self-injury and suicidal ideas. The patient is nervous/anxious.     Physical Exam Vitals reviewed.  Constitutional:      General: He is not in acute distress.    Appearance: Normal appearance. He is obese. He is not ill-appearing.  HENT:     Head: Normocephalic and atraumatic.  Eyes:     Pupils: Pupils are equal, round, and reactive to light.  Cardiovascular:     Rate and Rhythm: Normal rate and regular rhythm.  Pulmonary:     Effort: Pulmonary effort is normal. No respiratory distress.  Neurological:     Mental Status: He is alert and oriented to person, place, and time.  Psychiatric:        Mood and Affect: Mood normal.        Behavior: Behavior normal.       Assessment/Plan: 1. Type 2 diabetes  mellitus with hyperglycemia, with long-term current use of insulin  (HCC) (Primary) Start jardiance  10 mg samples until you are able to get the jardiance  25 mg from the pharmacy then increase the dose to 25 mg daily. Follow up in 3-4 weeks  - empagliflozin  (JARDIANCE ) 25 MG TABS tablet; Take 1 tablet (25 mg total) by mouth daily before breakfast.  Dispense: 30 tablet; Refill: 5  2. Essential hypertension Stable, continue current medications as prescribed.   3. Other fatigue As a symptom of elevated blood glucose. Should improve with better glucose control.   General Counseling: onyekachi gathright understanding of the findings of todays visit and  agrees with plan of treatment. I have discussed any further diagnostic evaluation that may be needed or ordered today. We also reviewed his medications today. he has been encouraged to call the office with any questions or concerns that should arise related to todays visit.    Counseling:    No orders of the defined types were placed in this encounter.   Meds ordered this encounter  Medications   empagliflozin  (JARDIANCE ) 25 MG TABS tablet    Sig: Take 1 tablet (25 mg total) by mouth daily before breakfast.    Dispense:  30 tablet    Refill:  5    Fill new script today, dx code E11.65    Return in about 4 weeks (around 08/05/2023) for F/U, eval new med.  [place] Controlled Substance Database was reviewed by me for overdose risk score (ORS)  Time spent:30 Minutes Time spent with patient included reviewing progress notes, labs, imaging studies, and discussing plan for follow up.   This patient was seen by Laurence Pons, FNP-C in collaboration with Dr. Verneta Gone as a part of collaborative care agreement.  Cashmere Dingley R. Bobbi Burow, MSN, FNP-C Internal Medicine

## 2023-07-16 DIAGNOSIS — R202 Paresthesia of skin: Secondary | ICD-10-CM | POA: Diagnosis not present

## 2023-07-16 DIAGNOSIS — Z1331 Encounter for screening for depression: Secondary | ICD-10-CM | POA: Diagnosis not present

## 2023-07-16 DIAGNOSIS — R519 Headache, unspecified: Secondary | ICD-10-CM | POA: Diagnosis not present

## 2023-07-16 DIAGNOSIS — R200 Anesthesia of skin: Secondary | ICD-10-CM | POA: Diagnosis not present

## 2023-07-16 DIAGNOSIS — R531 Weakness: Secondary | ICD-10-CM | POA: Diagnosis not present

## 2023-07-16 DIAGNOSIS — R52 Pain, unspecified: Secondary | ICD-10-CM | POA: Diagnosis not present

## 2023-07-16 DIAGNOSIS — I639 Cerebral infarction, unspecified: Secondary | ICD-10-CM | POA: Diagnosis not present

## 2023-07-16 DIAGNOSIS — M5481 Occipital neuralgia: Secondary | ICD-10-CM | POA: Diagnosis not present

## 2023-07-16 DIAGNOSIS — I69398 Other sequelae of cerebral infarction: Secondary | ICD-10-CM | POA: Diagnosis not present

## 2023-07-23 ENCOUNTER — Telehealth: Payer: Self-pay

## 2023-07-23 NOTE — Telephone Encounter (Signed)
 Completed P.A. for patient's Jardiance  25mg  and it was approved.

## 2023-07-23 NOTE — Telephone Encounter (Signed)
 Lmom that jardiance  is approved

## 2023-07-23 NOTE — Telephone Encounter (Signed)
 Pt called that  his glucose fasting today 295  and stay in 310,330  as per pt he is taking tresiba  24 units as per lauren advised that take up to 28 units tresiba  and pt still waiting for jardiance  25 mg and right now he on jardiance  10 mg sample advised him to let phar fax us  for PA

## 2023-07-26 NOTE — Progress Notes (Unsigned)
 07/27/2023 9:36 PM   Gerald [name] [birth year] [id number]  Referring provider: Kristina Tinnie POUR, PA-C [address] [place],  [place] [address]  Urological history: 1. ED - Sildenafil  50 mg, on-demand dosing  2. BPH with LU TS - PSA (2024) 1.4   3. Hypogonadism - discontinued TRT secondary to stroke  4. Elevated PSA  - PSA (2023) 5.7   No chief complaint on file.  HPI: Gerald [name] is a 58 y.o. man who presents today for one month follow up after a trial of Gemtesa .    Previous records reviewed.   At his visit on 06/26/2023.  I PSS 26/4.  PVR 2 mL.  Today's UA yellow clear, specific gravity 1.020, pH 6.0, 1+ glucose, 0-5 WBC's, > 10 epithelial cells and few bacteria.  Urine culture was negative.  He has suffered two strokes since his last visit with us .  He has been experiencing urgency, urge incontinence for the last month.  He is using the restroom day and night every hour.  The volume of urine he expels is variable.  He is also experiencing post-void dribbling.  Patient denies any modifying or aggravating factors.  Patient denies any recent UTI's, gross hematuria, dysuria or suprapubic/flank pain.  Patient denies any fevers, chills, nausea or vomiting.   He was started on Gemtesa  75 mg daily.   I PSS ***     Score:  1-7 Mild 8-19 Moderate 20-35 Severe   PMH: Past Medical History:  Diagnosis Date   Abnormal LFTs    Anxiety    Atypical chest pain    Borderline diabetes    CAD (coronary artery disease)    a.) PCI and stent placement (unknown type) to mLAD and mLCx; date unknown. b.) LHC 09/10/2010: 40% mLAD; no intervention. c.) LHC 09/12/2011: EF 60%; 30% ISR mLAD, 40% dLAD, 30% pLCx, 30% ISR mLCx, 40% mRCA; no intervention. d.) LHC 05/23/2014: EF >55%; 30% p-mLAD; no intervention. e.) LHC 06/11/2016: EF 55-65%; LVEDP norm; 30-40% p-mLAD; no intervention.   Carcinoma (HCC)    Chronic pain syndrome    Depression    Diabetes mellitus without  complication (HCC)    Fundic gland polyps of stomach, benign    Gastritis    GERD (gastroesophageal reflux disease)    Hepatic steatosis    Hyperlipidemia    Hypertension    Migraines    Myocardial infarction [facility]) 2008   was told by PCP   Nonischemic cardiomyopathy (HCC)    a.) TTE 09/12/2011: EF 35-45%. b.) LHC 09/12/2011: EF 60%. c.) TTE 06/04/2012: EF 55-60%. d.) LHC 05/23/2014: EF >55%. e.) TTE 06/11/2016: EF 55-60%; G1DD. f.) LHC 06/11/2016: EF 55-65%. g.) TTE 01/08/2018: EF 60-65%.   Obesity    Occlusion and stenosis of bilateral carotid arteries    OSA on CPAP    Osteoarthritis of both knees    Shortness of breath    Squamous cell carcinoma of skin 02/27/2020   left distal lat deltoid - [facility]    Surgical History: Past Surgical History:  Procedure Laterality Date   BONE EXCISION Right 11/21/2020   Procedure: PART EXCISION BONE- PHALANX;  Surgeon: Ashley Soulier, DPM;  Location: [facility];  Service: Podiatry;  Laterality: Right;   CARDIAC CATHETERIZATION N/A 09/2011   [facility]; EF 50% with 30% mid LAD stenosis and no obstructive disease.   CARDIAC CATHETERIZATION  09/2010   [facility]; Mid LAD 40% stenosis; Mid Circumflex:Normal; Mid RCA; Normal   CARDIAC CATHETERIZATION  COLONOSCOPY     COLONOSCOPY WITH PROPOFOL  N/A 04/10/2021   Procedure: COLONOSCOPY WITH PROPOFOL ;  Surgeon: Therisa Bi, MD;  Location: [facility] ENDOSCOPY;  Service: Gastroenterology;  Laterality: N/A;   ESOPHAGOGASTRODUODENOSCOPY N/A 04/10/2021   Procedure: ESOPHAGOGASTRODUODENOSCOPY (EGD);  Surgeon: Therisa Bi, MD;  Location: [facility] ENDOSCOPY;  Service: Gastroenterology;  Laterality: N/A;   ESOPHAGOGASTRODUODENOSCOPY (EGD) WITH PROPOFOL  N/A 06/10/2017   Procedure: ESOPHAGOGASTRODUODENOSCOPY (EGD) WITH PROPOFOL ;  Surgeon: Therisa Bi, MD;  Location: [facility] ENDOSCOPY;  Service: Gastroenterology;  Laterality: N/A;   ESOPHAGOGASTRODUODENOSCOPY (EGD) WITH PROPOFOL  N/A 01/01/2022   Procedure: ESOPHAGOGASTRODUODENOSCOPY  (EGD) WITH PROPOFOL ;  Surgeon: Therisa Bi, MD;  Location: [facility] ENDOSCOPY;  Service: Gastroenterology;  Laterality: N/A;   ESOPHAGOGASTRODUODENOSCOPY (EGD) WITH PROPOFOL  N/A 01/06/2022   Procedure: ESOPHAGOGASTRODUODENOSCOPY (EGD) WITH PROPOFOL ;  Surgeon: Therisa Bi, MD;  Location: [facility] ENDOSCOPY;  Service: Gastroenterology;  Laterality: N/A;   HAMMER TOE SURGERY Right 11/21/2020   Procedure: HAMMERTOE CORRECTION;  Surgeon: Ashley Soulier, DPM;  Location: [facility];  Service: Podiatry;  Laterality: Right;   INSERTION OF MESH  04/16/2021   Procedure: INSERTION OF MESH;  Surgeon: Desiderio Schanz, MD;  Location: [facility] ORS;  Service: General;;   KNEE ARTHROSCOPY WITH MEDIAL MENISECTOMY Right 09/16/2012   Procedure: RIGHT KNEE ARTHROSCOPY WITH MEDIAL AND LATERAL MENISECTOMY, CHONDROPLASTY;  Surgeon: Toribio JULIANNA Chancy, MD;  Location: [facility];  Service: Orthopedics;  Laterality: Right;  RIGHT KNEE SCOPE MEDIAL MENISCECTOMY   LEFT HEART CATH AND CORONARY ANGIOGRAPHY N/A 06/11/2016   Procedure: Left Heart Cath and Coronary Angiography;  Surgeon: Perla Evalene PARAS, MD;  Location: [facility] INVASIVE CV LAB;  Service: Cardiovascular;  Laterality: N/A;   UMBILICAL HERNIA REPAIR N/A 04/16/2021   Procedure: HERNIA REPAIR UMBILICAL ADULT, open;  Surgeon: Desiderio Schanz, MD;  Location: [facility] ORS;  Service: General;  Laterality: N/A;    Home Medications:  Allergies as of 07/27/2023       Reactions   Nexlizet  [bempedoic Acid-ezetimibe ] Rash   Statins Hives, Rash   For high dose statin        Medication List        Accurate as of July 26, 2023  9:36 PM. If you have any questions, ask your nurse or doctor.          aspirin  EC 81 MG tablet Take 1 tablet (81 mg total) by mouth daily. Swallow whole.   BD Pen Needle Nano U/F 32G X 4 MM Misc Generic drug: Insulin  Pen Needle Use as directed once a daily   citalopram  40 MG tablet Commonly known as: CELEXA  TAKE 1 TABLET BY MOUTH EVERY DAY    clopidogrel  75 MG tablet Commonly known as: PLAVIX  Take 1 tablet (75 mg total) by mouth daily.   cyclobenzaprine  10 MG tablet Commonly known as: FLEXERIL  TAKE 1 TABLET (10 MG TOTAL) BY MOUTH AT BEDTIME FOR BACK SPASM AS NEEDED ONLY   empagliflozin  25 MG Tabs tablet Commonly known as: Jardiance  Take 1 tablet (25 mg total) by mouth daily before breakfast.   furosemide  20 MG tablet Commonly known as: LASIX  TAKE 1 TABLET BY MOUTH EVERY DAY AS NEEDED FOR LEG SWELLING   gabapentin  300 MG capsule Commonly known as: NEURONTIN  TAKE 1 CAPSULE BY MOUTH THREE TIMES A DAY   Gemtesa  75 MG Tabs Generic drug: Vibegron  Take 1 tablet (75 mg total) by mouth daily.   lamoTRIgine  25 MG tablet Commonly known as: LAMICTAL  Take 1 tablet (25 mg total) by mouth at bedtime for 14 days, THEN 1 tablet (25  mg total) 2 (two) times daily for 14 days. Start taking on: April 18, 2023   lisinopril -hydrochlorothiazide  20-25 MG tablet Commonly known as: ZESTORETIC  TAKE 1 TABLET BY MOUTH EVERY DAY   metoprolol  tartrate 100 MG tablet Commonly known as: LOPRESSOR  TAKE 1 TABLET 2 HR PRIOR TO CARDIAC PROCEDURE   MULTIVITAMIN ADULTS PO Take 1 tablet by mouth daily.   nitroGLYCERIN  0.4 MG SL tablet Commonly known as: NITROSTAT  Place 1 tablet (0.4 mg total) under the tongue every 5 (five) minutes x 3 doses as needed for chest pain.   nortriptyline 10 MG capsule Commonly known as: PAMELOR Take 10 mg by mouth at bedtime.   Repatha  SureClick 140 MG/ML Soaj Generic drug: Evolocumab  Inject 140 mg into the skin every 14 (fourteen) days.   rosuvastatin  5 MG tablet Commonly known as: CRESTOR  Take 1 tablet (5 mg total) by mouth at bedtime.   Tresiba  FlexTouch 100 UNIT/ML FlexTouch Pen Generic drug: insulin  degludec Inject 10 Units into the skin daily.   Trulicity  0.75 MG/0.5ML Soaj Generic drug: Dulaglutide  Inject 0.75 mg into the skin once a week.        Allergies:  Allergies  Allergen Reactions    Nexlizet  [Bempedoic Acid-Ezetimibe ] Rash   Statins Hives and Rash    For high dose statin    Family History: Family History  Problem Relation Age of Onset   Heart disease Father 24       MI   Heart attack Father    Stomach cancer Father    Hypertension Mother    Breast cancer Mother     Social History:  reports that he has never smoked. He has been exposed to tobacco smoke. He quit smokeless tobacco use about 38 years ago.  His smokeless tobacco use included chew. He reports that he does not currently use alcohol. He reports that he does not use drugs.  ROS: Pertinent ROS in HPI  Physical Exam: There were no vitals taken for this visit.  Constitutional:  Well nourished. Alert and oriented, No acute distress. HEENT: [place] AT, moist mucus membranes.  Trachea midline, no masses. Cardiovascular: No clubbing, cyanosis, or edema. Respiratory: Normal respiratory effort, no increased work of breathing. GI: Abdomen is soft, non tender, non distended, no abdominal masses. Liver and spleen not palpable.  No hernias appreciated.  Stool sample for occult testing is not indicated.   GU: No CVA tenderness.  No bladder fullness or masses.  Patient with circumcised/uncircumcised phallus. ***Foreskin easily retracted***  Urethral meatus is patent.  No penile discharge. No penile lesions or rashes. Scrotum without lesions, cysts, rashes and/or edema.  Testicles are located scrotally bilaterally. No masses are appreciated in the testicles. Left and right epididymis are normal. Rectal: Patient with  normal sphincter tone. Anus and perineum without scarring or rashes. No rectal masses are appreciated. Prostate is approximately *** grams, *** nodules are appreciated. Seminal vesicles are normal. Skin: No rashes, bruises or suspicious lesions. Lymph: No cervical or inguinal adenopathy. Neurologic: Grossly intact, no focal deficits, moving all 4 extremities. Psychiatric: Normal mood and affect.    Laboratory Data: N/A  Pertinent Imaging: N/A  Assessment & Plan:    1. OAB - BLADDER SCAN AMB NON-IMAGING, demonstrates adequate bladder emptying - Urinalysis, Complete -Discussed how diabetes and his history of stroke plays into his symptoms - Will have a trial of Gemtesa  75 mg daily, prescription sent to pharmacy  2. Lower urinary tract symptoms (LUTS) - BLADDER SCAN AMB NON-IMAGING, demonstrates adequate bladder emptying -  Urinalysis, Complete, unremarkable  - Urine sent for culture to rule out any indolent infection   No follow-ups on file.  These notes generated with voice recognition software. I apologize for typographical errors.  CLOTILDA HELON RIGGERS  [facility] [address]  [address] [place], [place] [address] [phone]

## 2023-07-27 ENCOUNTER — Encounter: Payer: Self-pay | Admitting: Urology

## 2023-07-27 ENCOUNTER — Ambulatory Visit: Payer: Self-pay | Admitting: Urology

## 2023-07-27 ENCOUNTER — Ambulatory Visit: Admitting: Urology

## 2023-07-27 ENCOUNTER — Other Ambulatory Visit
Admission: RE | Admit: 2023-07-27 | Discharge: 2023-07-27 | Disposition: A | Discharge status: Home / Self Care | Attending: Urology | Admitting: Urology

## 2023-07-27 VITALS — BP 101/71 | HR 81 | Ht 74.0 in | Wt 314.4 lb

## 2023-07-27 DIAGNOSIS — R399 Unspecified symptoms and signs involving the genitourinary system: Secondary | ICD-10-CM | POA: Diagnosis not present

## 2023-07-27 DIAGNOSIS — N3281 Overactive bladder: Secondary | ICD-10-CM

## 2023-07-27 DIAGNOSIS — N476 Balanoposthitis: Secondary | ICD-10-CM | POA: Diagnosis not present

## 2023-07-27 DIAGNOSIS — N418 Other inflammatory diseases of prostate: Secondary | ICD-10-CM | POA: Diagnosis not present

## 2023-07-27 LAB — URINALYSIS, COMPLETE (UACMP) WITH MICROSCOPIC
Bilirubin Urine: NEGATIVE
Glucose, UA: >=500 mg/dL — AB
Hgb urine dipstick: NEGATIVE
Ketones, ur: NEGATIVE mg/dL
Leukocytes,Ua: NEGATIVE
Nitrite: NEGATIVE
Protein, ur: NEGATIVE mg/dL
Specific Gravity, Urine: 1.01 (ref 1.005–1.030)
pH: 5.5 (ref 5.0–8.0)

## 2023-07-27 MED ORDER — NYSTATIN-TRIAMCINOLONE 100000-0.1 UNIT/GM-% EX OINT: 1.0000 | TOPICAL_OINTMENT | Freq: Two times a day (BID) | CUTANEOUS | 0 refills | Status: AC

## 2023-07-27 MED ORDER — SULFAMETHOXAZOLE-TRIMETHOPRIM 800-160 MG PO TABS: 1.0000 | ORAL_TABLET | Freq: Two times a day (BID) | ORAL | 0 refills | Status: DC

## 2023-07-28 ENCOUNTER — Emergency Department: Admission: EM | Admit: 2023-07-28 | Discharge: 2023-07-28 | Disposition: A | Discharge status: Home / Self Care

## 2023-07-28 ENCOUNTER — Emergency Department

## 2023-07-28 ENCOUNTER — Other Ambulatory Visit: Payer: Self-pay

## 2023-07-28 DIAGNOSIS — E871 Hypo-osmolality and hyponatremia: Secondary | ICD-10-CM | POA: Diagnosis not present

## 2023-07-28 DIAGNOSIS — I6782 Cerebral ischemia: Secondary | ICD-10-CM | POA: Diagnosis not present

## 2023-07-28 DIAGNOSIS — I10 Essential (primary) hypertension: Secondary | ICD-10-CM | POA: Diagnosis not present

## 2023-07-28 DIAGNOSIS — R251 Tremor, unspecified: Secondary | ICD-10-CM | POA: Diagnosis not present

## 2023-07-28 DIAGNOSIS — E119 Type 2 diabetes mellitus without complications: Secondary | ICD-10-CM | POA: Insufficient documentation

## 2023-07-28 DIAGNOSIS — R531 Weakness: Secondary | ICD-10-CM | POA: Diagnosis not present

## 2023-07-28 DIAGNOSIS — Z8673 Personal history of transient ischemic attack (TIA), and cerebral infarction without residual deficits: Secondary | ICD-10-CM | POA: Diagnosis not present

## 2023-07-28 DIAGNOSIS — D72829 Elevated white blood cell count, unspecified: Secondary | ICD-10-CM | POA: Insufficient documentation

## 2023-07-28 DIAGNOSIS — M50221 Other cervical disc displacement at C4-C5 level: Secondary | ICD-10-CM | POA: Diagnosis not present

## 2023-07-28 DIAGNOSIS — M50222 Other cervical disc displacement at C5-C6 level: Secondary | ICD-10-CM | POA: Diagnosis not present

## 2023-07-28 DIAGNOSIS — M4802 Spinal stenosis, cervical region: Secondary | ICD-10-CM | POA: Diagnosis not present

## 2023-07-28 DIAGNOSIS — M5021 Other cervical disc displacement,  high cervical region: Secondary | ICD-10-CM | POA: Diagnosis not present

## 2023-07-28 HISTORY — DX: Cerebral infarction, unspecified: I63.9

## 2023-07-28 HISTORY — DX: Heart failure, unspecified: I50.9

## 2023-07-28 LAB — CBC
HCT: 43.9 % (ref 39.0–52.0)
Hemoglobin: 15.3 g/dL (ref 13.0–17.0)
MCH: 29.7 pg (ref 26.0–34.0)
MCHC: 34.9 g/dL (ref 30.0–36.0)
MCV: 85.1 fL (ref 80.0–100.0)
Platelets: 259 10*3/uL (ref 150–400)
RBC: 5.16 MIL/uL (ref 4.22–5.81)
RDW: 13.4 % (ref 11.5–15.5)
WBC: 12.4 10*3/uL — ABNORMAL HIGH (ref 4.0–10.5)
nRBC: 0 % (ref 0.0–0.2)

## 2023-07-28 LAB — DIFFERENTIAL
Abs Immature Granulocytes: 0.12 10*3/uL — ABNORMAL HIGH (ref 0.00–0.07)
Basophils Absolute: 0 10*3/uL (ref 0.0–0.1)
Basophils Relative: 0 %
Eosinophils Absolute: 0.1 10*3/uL (ref 0.0–0.5)
Eosinophils Relative: 1 %
Immature Granulocytes: 1 %
Lymphocytes Relative: 32 %
Lymphs Abs: 3.9 10*3/uL (ref 0.7–4.0)
Monocytes Absolute: 0.7 10*3/uL (ref 0.1–1.0)
Monocytes Relative: 6 %
Neutro Abs: 7.5 10*3/uL (ref 1.7–7.7)
Neutrophils Relative %: 60 %

## 2023-07-28 LAB — PROTIME-INR
INR: 1.1 (ref 0.8–1.2)
Prothrombin Time: 14.5 s (ref 11.4–15.2)

## 2023-07-28 LAB — COMPREHENSIVE METABOLIC PANEL WITH GFR
ALT: 48 U/L — ABNORMAL HIGH (ref 0–44)
AST: 37 U/L (ref 15–41)
Albumin: 4 g/dL (ref 3.5–5.0)
Alkaline Phosphatase: 85 U/L (ref 38–126)
Anion gap: 13 (ref 5–15)
BUN: 26 mg/dL — ABNORMAL HIGH (ref 6–20)
CO2: 25 mmol/L (ref 22–32)
Calcium: 8.9 mg/dL (ref 8.9–10.3)
Chloride: 96 mmol/L — ABNORMAL LOW (ref 98–111)
Creatinine, Ser: 1.33 mg/dL — ABNORMAL HIGH (ref 0.61–1.24)
GFR, Estimated: >60 mL/min (ref >60)
Glucose, Bld: 222 mg/dL — ABNORMAL HIGH (ref 70–99)
Potassium: 3.5 mmol/L (ref 3.5–5.1)
Sodium: 134 mmol/L — ABNORMAL LOW (ref 135–145)
Total Bilirubin: 0.5 mg/dL (ref 0.0–1.2)
Total Protein: 7.2 g/dL (ref 6.5–8.1)

## 2023-07-28 LAB — ETHANOL: Alcohol, Ethyl (B): <15 mg/dL (ref <15)

## 2023-07-28 LAB — APTT: aPTT: 28 s (ref 24–36)

## 2023-07-28 MED ORDER — GADOBUTROL 1 MMOL/ML IV SOLN
10.0000 mL | Freq: Once | INTRAVENOUS | Status: AC | PRN
  Administered 2023-07-28: 10 mL via INTRAVENOUS

## 2023-07-28 MED ORDER — LORAZEPAM 2 MG/ML IJ SOLN
0.5000 mg | Freq: Once | INTRAMUSCULAR | Status: AC | PRN
  Administered 2023-07-28: 0.5 mg via INTRAVENOUS
  Filled 2023-07-28: qty 1

## 2023-07-28 NOTE — ED Notes (Signed)
 Pt reports weakness in both hands since yesterday.  Pt reports having difficulty holding utensils yesterday   pt was seen in neuro today and sent to er for eval.  Pt has dizziness and a headache.  Pt states head hurts every day.  No n/v/d  pt alert  speech clear. Hx cva 10/24 and 3/25  family with pt.

## 2023-07-28 NOTE — ED Notes (Signed)
Meds given for mri

## 2023-07-28 NOTE — ED Triage Notes (Signed)
 Pt to ED for bilateral hand weakness since 1130am yesterday. Yesterday was drinking and his R hand could not grip the cup. Hx 2 strokes with L sided numbness and weakness (whole side). Speech is clear. No arm drift. Takes Plavix , no missed doses. States R arm less weak today than yesterday.

## 2023-07-28 NOTE — ED Notes (Signed)
Pt alert, family with pt.   

## 2023-07-28 NOTE — ED Provider Notes (Signed)
 [facility] Provider Note    Event Date/Time   First MD Initiated Contact with Patient 07/28/23 1557     (approximate)   History   hand weakness  Pt to ED for bilateral hand weakness since 1130am yesterday. Yesterday was drinking and his R hand could not grip the cup. Hx 2 strokes with L sided numbness and weakness (whole side). Speech is clear. No arm drift. Takes Plavix , no missed doses. States R arm less weak today than yesterday.   HPI Gerald [name] is a 58 y.o. male PMH multiple medical comorbidities including hypertension, hyperlipidemia, DM2, nonischemic cardiomyopathy, OSA on CPAP, prior CVA, GERD, chronic pain syndrome, occipital neuralgia, impingement syndrome of right shoulder presents for evaluation of bilateral hand weakness - Patient states yesterday he had an episode around 11:30 AM in which both of his hands became weak and started trembling.  This lasted about 2 hours.  No other focal findings.  They messaged his neurology office later in the day and this morning were told to come to the emergency department. - No recurrence of symptoms.  Currently asymptomatic. - No new neck or back pain, no preceding trauma.  No saddle anesthesia.  No new urinary or fecal incontinence or retention.  Feels he is at his neurologic baseline and wife bedside agrees.   Per chart review, last seen by his neurologist on 07/16/2023 via telemedicine visit.  Noted to have residual left-sided weakness and numbness from prior strokes.  Noted to have daily severe headaches in occipital region.  On Plavix .     Physical Exam   Triage Vital Signs: ED Triage Vitals  Encounter Vitals Group     BP 07/28/23 1437 117/76     Girls Systolic BP Percentile --      Girls Diastolic BP Percentile --      Boys Systolic BP Percentile --      Boys Diastolic BP Percentile --      Pulse Rate 07/28/23 1437 (!) 105     Resp 07/28/23 1437 20     Temp 07/28/23 1437 98.3 F (36.8  C)     Temp src --      SpO2 07/28/23 1437 97 %     Weight 07/28/23 1445 (!) 315 lb (142.9 kg)     Height 07/28/23 1445 6' 2 (1.88 m)     Head Circumference --      Peak Flow --      Pain Score 07/28/23 1445 8     Pain Loc --      Pain Education --      Exclude from Growth Chart --     Most recent vital signs: Vitals:   07/28/23 2053 07/28/23 2227  BP:  (!) 144/81  Pulse: 76 79  Resp: 20 18  Temp:    SpO2: 95% 97%     General: Awake, no distress.  CV:  Good peripheral perfusion. RRR, RP 2+ Resp:  Normal effort. CTAB Abd:  No distention. Nontender to deep palpation throughout Neuro:  Aox4, CN II-XII intact, FNF wnl RUE but mild ataxia in LUE, finger taps fast b/l, somewhat diminished sensation in left face/arm/leg.  BUE AG 10+ sec no drift, RLE AG 5+ seconds no drift, able to lift LLE but drifts to bed after 2 seconds.    ED Results / Procedures / Treatments   Labs (all labs ordered are listed, but only abnormal results are displayed) Labs Reviewed  CBC - Abnormal; Notable for  the following components:      Result Value   WBC 12.4 (*)    All other components within normal limits  DIFFERENTIAL - Abnormal; Notable for the following components:   Abs Immature Granulocytes 0.12 (*)    All other components within normal limits  COMPREHENSIVE METABOLIC PANEL WITH GFR - Abnormal; Notable for the following components:   Sodium 134 (*)    Chloride 96 (*)    Glucose, Bld 222 (*)    BUN 26 (*)    Creatinine, Ser 1.33 (*)    ALT 48 (*)    All other components within normal limits  PROTIME-INR  APTT  ETHANOL  CBG MONITORING, ED     EKG  N/a   RADIOLOGY Radiology interpreted by myself and radiology reports reviewed.  No obvious acute pathology identified.    PROCEDURES:  Critical Care performed: No  Procedures   MEDICATIONS ORDERED IN ED: Medications  LORazepam  (ATIVAN ) injection 0.5 mg (0.5 mg Intravenous Given 07/28/23 1924)  gadobutrol (GADAVIST) 1  MMOL/ML injection 10 mL (10 mLs Intravenous Contrast Given 07/28/23 2030)     IMPRESSION / MDM / ASSESSMENT AND PLAN / ED COURSE  I reviewed the triage vital signs and the nursing notes.                              DDX/MDM/AP: Differential diagnosis includes, but is not limited to, TIA, consider CVA, history and findings less suggestive of seizure.  Consider underlying electrolyte abnormality.  Plan: - Labs - CT head - Reassess  Patient's presentation is most consistent with acute presentation with potential threat to life or bodily function.  The patient is on the cardiac monitor to evaluate for evidence of arrhythmia and/or significant heart rate changes.  ED course below.  CT head with no acute pathology.  Discussed case with neurology who recommends MRI C-spine and MRI head with contrast--if unremarkable, no concern for any acute pathology and patient stable for discharge.  No acute findings on MRIs, patient remains asymptomatic here, stable for outpatient neurology and PMD follow-up.  ED return precautions placed.  Patient agrees with plan.  Clinical Course as of 07/29/23 0038  Tue Jul 28, 2023  1654 CMP reviewed, mild AKI, otherwise unremarkable. [MM]  1654 CBC with mild leukocytosis similar to 2 months ago, otherwise unremarkable [MM]  1654 CTH: IMPRESSION: 1.  No evidence of an acute intracranial abnormality. 2. Known small chronic lacunar infarcts within the deep gray nuclei on the right. 3. Mild cerebral white matter chronic small vessel ischemic disease.     [MM]  1707 Neuro paged to discuss [MM]  1712 D/w Dr. Merrianne of neurology  Recommends MRI brain and MRI cspine with and without contrast If negative, stable for DC home and outpt f/u [MM]  2147 MRIs reviewed, no underlying acute pathology.  Stable for discharge home.  Findings discussed with patient, feels well and requesting discharge home.  Will proceed with discharge. [MM]  2148 MRI Cspine: IMPRESSION: 1.  Normal MRI appearance of the cervical spinal cord. No acute abnormality. 2. Right paracentral disc protrusion at C5-6 with resultant mild spinal stenosis. 3. Additional mild noncompressive disc bulging at C3-4 through C6-7 without significant stenosis or overt neural impingement. 4. Mild right C4, bilateral C5, left C7, and right C8 foraminal stenosis related to disc bulge and uncovertebral disease.   [MM]  2149 MR Brain: IMPRESSION: 1. No acute intracranial abnormality. 2. Remote lacunar  infarcts involving the left anterior genu of the corpus callosum, right lentiform nucleus, and right thalamus.   [MM]    Clinical Course User Index [MM] Clarine Ozell LABOR, MD     FINAL CLINICAL IMPRESSION(S) / ED DIAGNOSES   Final diagnoses:  Tremor     Rx / DC Orders   ED Discharge Orders     None        Note:  This document was prepared using Dragon voice recognition software and may include unintentional dictation errors.   Clarine Ozell LABOR, MD 07/29/23 [phone]

## 2023-07-28 NOTE — Discharge Instructions (Signed)
 Your evaluation in the emergency department was overall reassuring, your symptoms fortunately not recurred.  Your case was discussed with the neurologist who recommended MRI, and showed no abnormalities.  Please do follow-up with your primary care provider and neurologist for reevaluation, and return to the emergency department with any new or worsening symptoms.

## 2023-07-29 ENCOUNTER — Ambulatory Visit: Attending: Cardiology | Admitting: Cardiology

## 2023-07-29 ENCOUNTER — Encounter: Payer: Self-pay | Admitting: Cardiology

## 2023-07-29 VITALS — BP 126/72 | HR 89 | Ht 74.0 in | Wt 312.0 lb

## 2023-07-29 DIAGNOSIS — I10 Essential (primary) hypertension: Secondary | ICD-10-CM

## 2023-07-29 DIAGNOSIS — I639 Cerebral infarction, unspecified: Secondary | ICD-10-CM

## 2023-07-29 DIAGNOSIS — E782 Mixed hyperlipidemia: Secondary | ICD-10-CM

## 2023-07-29 DIAGNOSIS — I2510 Atherosclerotic heart disease of native coronary artery without angina pectoris: Secondary | ICD-10-CM | POA: Diagnosis not present

## 2023-07-29 NOTE — Progress Notes (Signed)
 Cardiology Office Note:    Date:  07/29/2023   ID:  Gerald [name], DOB 17-Jun-1965, MRN [id number]  PCP:  Kristina Tinnie MARLA DEVONNA   [facility] Providers Cardiologist:  Redell Cave, MD     Referring MD: Kristina Tinnie MARLA, PA*   Chief Complaint  Patient presents with   Follow-up    3 month follow up pat has been doing well with no complaints of chest pain, chest pressure or SOB, medciation reviewed verbally with patient    History of Present Illness:    Gerald [name] is a 58 y.o. male with a hx of nonobstructive CAD (40% pLAD), hyperlipidemia, CVA x 2 (on 10/24, 3/25 s/p TNK) presenting for hospital follow-up.  Previously seen with symptoms of chest pain, coronary CT obtained to evaluate any progression of LAD disease.  He still has left-sided weakness due to stroke.  Walks with a walker.  Has persistent headaches, nerve procedure/nerve implantation device being planned by neurology.  Cardiac monitor did not reveal any A-fib or flutter.  Patient overall feels well from a cardiac perspective.  Prior notes/testing Lexiscan  Myoview  05/2022 no significant ischemia. Echo 11/2022 EF 50 to 55%. Echo 2019 EF 60 to 65% Left heart cath 2018 nonobstructive CAD 40% proximal LAD.   Past Medical History:  Diagnosis Date   Abnormal LFTs    Anxiety    Atypical chest pain    Borderline diabetes    CAD (coronary artery disease)    a.) PCI and stent placement (unknown type) to mLAD and mLCx; date unknown. b.) LHC 09/10/2010: 40% mLAD; no intervention. c.) LHC 09/12/2011: EF 60%; 30% ISR mLAD, 40% dLAD, 30% pLCx, 30% ISR mLCx, 40% mRCA; no intervention. d.) LHC 05/23/2014: EF >55%; 30% p-mLAD; no intervention. e.) LHC 06/11/2016: EF 55-65%; LVEDP norm; 30-40% p-mLAD; no intervention.   Carcinoma (HCC)    CHF (congestive heart failure) (HCC)    Chronic pain syndrome    Depression    Diabetes mellitus without complication (HCC)    Fundic gland polyps of  stomach, benign    Gastritis    GERD (gastroesophageal reflux disease)    Hepatic steatosis    Hyperlipidemia    Hypertension    Migraines    Myocardial infarction [facility]) 2008   was told by PCP   Nonischemic cardiomyopathy (HCC)    a.) TTE 09/12/2011: EF 35-45%. b.) LHC 09/12/2011: EF 60%. c.) TTE 06/04/2012: EF 55-60%. d.) LHC 05/23/2014: EF >55%. e.) TTE 06/11/2016: EF 55-60%; G1DD. f.) LHC 06/11/2016: EF 55-65%. g.) TTE 01/08/2018: EF 60-65%.   Obesity    Occlusion and stenosis of bilateral carotid arteries    OSA on CPAP    Osteoarthritis of both knees    Shortness of breath    Squamous cell carcinoma of skin 02/27/2020   left distal lat deltoid - EDC   Stroke (HCC)    10/24, 3/25    Past Surgical History:  Procedure Laterality Date   BONE EXCISION Right 11/21/2020   Procedure: PART EXCISION BONE- PHALANX;  Surgeon: Ashley Soulier, DPM;  Location: [facility];  Service: Podiatry;  Laterality: Right;   CARDIAC CATHETERIZATION N/A 09/2011   [facility]; EF 50% with 30% mid LAD stenosis and no obstructive disease.   CARDIAC CATHETERIZATION  09/2010   [facility]; Mid LAD 40% stenosis; Mid Circumflex:Normal; Mid RCA; Normal   CARDIAC CATHETERIZATION     COLONOSCOPY     COLONOSCOPY WITH PROPOFOL  N/A 04/10/2021   Procedure: COLONOSCOPY WITH PROPOFOL ;  Surgeon:  Therisa Bi, MD;  Location: [facility] ENDOSCOPY;  Service: Gastroenterology;  Laterality: N/A;   ESOPHAGOGASTRODUODENOSCOPY N/A 04/10/2021   Procedure: ESOPHAGOGASTRODUODENOSCOPY (EGD);  Surgeon: Therisa Bi, MD;  Location: [facility] ENDOSCOPY;  Service: Gastroenterology;  Laterality: N/A;   ESOPHAGOGASTRODUODENOSCOPY (EGD) WITH PROPOFOL  N/A 06/10/2017   Procedure: ESOPHAGOGASTRODUODENOSCOPY (EGD) WITH PROPOFOL ;  Surgeon: Therisa Bi, MD;  Location: [facility] ENDOSCOPY;  Service: Gastroenterology;  Laterality: N/A;   ESOPHAGOGASTRODUODENOSCOPY (EGD) WITH PROPOFOL  N/A 01/01/2022   Procedure: ESOPHAGOGASTRODUODENOSCOPY (EGD) WITH PROPOFOL ;  Surgeon: Therisa Bi, MD;  Location[facility];  Service: Gastroenterology;  Laterality: N/A;   ESOPHAGOGASTRODUODENOSCOPY (EGD) WITH PROPOFOL  N/A 01/06/2022   Procedure: ESOPHAGOGASTRODUODENOSCOPY (EGD) WITH PROPOFOL ;  Surgeon: Therisa Bi, MD;  Location: [facility] ENDOSCOPY;  Service: Gastroenterology;  Laterality: N/A;   HAMMER TOE SURGERY Right 11/21/2020   Procedure: HAMMERTOE CORRECTION;  Surgeon: Ashley Soulier, DPM;  Location: [facility];  Service: Podiatry;  Laterality: Right;   INSERTION OF MESH  04/16/2021   Procedure: INSERTION OF MESH;  Surgeon: Desiderio Schanz, MD;  Location: [facility] ORS;  Service: General;;   KNEE ARTHROSCOPY WITH MEDIAL MENISECTOMY Right 09/16/2012   Procedure: RIGHT KNEE ARTHROSCOPY WITH MEDIAL AND LATERAL MENISECTOMY, CHONDROPLASTY;  Surgeon: Toribio JULIANNA Chancy, MD;  Location: [facility];  Service: Orthopedics;  Laterality: Right;  RIGHT KNEE SCOPE MEDIAL MENISCECTOMY   LEFT HEART CATH AND CORONARY ANGIOGRAPHY N/A 06/11/2016   Procedure: Left Heart Cath and Coronary Angiography;  Surgeon: Perla Evalene PARAS, MD;  Location: [facility] INVASIVE CV LAB;  Service: Cardiovascular;  Laterality: N/A;   UMBILICAL HERNIA REPAIR N/A 04/16/2021   Procedure: HERNIA REPAIR UMBILICAL ADULT, open;  Surgeon: Desiderio Schanz, MD;  Location: [facility] ORS;  Service: General;  Laterality: N/A;    Current Medications: Current Meds  Medication Sig   aspirin  EC 81 MG tablet Take 1 tablet (81 mg total) by mouth daily. Swallow whole.   citalopram  (CELEXA ) 40 MG tablet TAKE 1 TABLET BY MOUTH EVERY DAY   clopidogrel  (PLAVIX ) 75 MG tablet Take 1 tablet (75 mg total) by mouth daily.   cyclobenzaprine  (FLEXERIL ) 10 MG tablet TAKE 1 TABLET (10 MG TOTAL) BY MOUTH AT BEDTIME FOR BACK SPASM AS NEEDED ONLY   empagliflozin  (JARDIANCE ) 25 MG TABS tablet Take 1 tablet (25 mg total) by mouth daily before breakfast.   Evolocumab  (REPATHA  SURECLICK) 140 MG/ML SOAJ Inject 140 mg into the skin every 14 (fourteen) days.    furosemide  (LASIX ) 20 MG tablet TAKE 1 TABLET BY MOUTH EVERY DAY AS NEEDED FOR LEG SWELLING   gabapentin  (NEURONTIN ) 300 MG capsule TAKE 1 CAPSULE BY MOUTH THREE TIMES A DAY   insulin  degludec (TRESIBA  FLEXTOUCH) 100 UNIT/ML FlexTouch Pen Inject 10 Units into the skin daily.   Insulin  Pen Needle (BD PEN NEEDLE NANO U/F) 32G X 4 MM MISC Use as directed once a daily   lamoTRIgine  (LAMICTAL ) 25 MG tablet Take 1 tablet (25 mg total) by mouth at bedtime for 14 days, THEN 1 tablet (25 mg total) 2 (two) times daily for 14 days.   lisinopril -hydrochlorothiazide  (ZESTORETIC ) 20-25 MG tablet TAKE 1 TABLET BY MOUTH EVERY DAY   metoprolol  tartrate (LOPRESSOR ) 100 MG tablet TAKE 1 TABLET 2 HR PRIOR TO CARDIAC PROCEDURE   Multiple Vitamins-Minerals (MULTIVITAMIN ADULTS PO) Take 1 tablet by mouth daily.   nitroGLYCERIN  (NITROSTAT ) 0.4 MG SL tablet Place 1 tablet (0.4 mg total) under the tongue every 5 (five) minutes x 3 doses as needed for chest pain.   nortriptyline (PAMELOR) 10 MG capsule Take  10 mg by mouth at bedtime.   nystatin -triamcinolone  ointment (MYCOLOG) Apply 1 Application topically 2 (two) times daily.   rosuvastatin  (CRESTOR ) 5 MG tablet Take 1 tablet (5 mg total) by mouth at bedtime.   sulfamethoxazole -trimethoprim  (BACTRIM  DS) 800-160 MG tablet Take 1 tablet by mouth every 12 (twelve) hours.   Vibegron  (GEMTESA ) 75 MG TABS Take 1 tablet (75 mg total) by mouth daily.     Allergies:   Nexlizet  [bempedoic acid-ezetimibe ] and Statins   Social History   Socioeconomic History   Marital status: Married    Spouse name: Not on file   Number of children: 3   Years of education: Not on file   Highest education level: Not on file  Occupational History   Occupation: Camera operator  Tobacco Use   Smoking status: Never    Passive exposure: Past   Smokeless tobacco: Former    Types: Chew    Quit date: 1987  Vaping Use   Vaping status: Never Used  Substance and Sexual Activity   Alcohol  use: Not Currently   Drug use: No   Sexual activity: Yes  Other Topics Concern   Not on file  Social History Narrative   Married.  52 yo son.   Wife on disability for bipolar disorder.     Social Drivers of Corporate investment banker Strain: Not on file  Food Insecurity: Food Insecurity Present (04/17/2023)   Hunger Vital Sign    Worried About Running Out of Food in the Last Year: Sometimes true    Ran Out of Food in the Last Year: Sometimes true  Transportation Needs: No Transportation Needs (04/17/2023)   PRAPARE - Administrator, Civil Service (Medical): No    Lack of Transportation (Non-Medical): No  Physical Activity: Not on file  Stress: Not on file  Social Connections: Unknown (11/18/2021)   Received from [facility]   Social Network    Social Network: Not on file     Family History: The patient's family history includes Breast cancer in his mother; Heart attack in his father; Heart disease (age of onset: 71) in his father; Hypertension in his mother; Stomach cancer in his father.  ROS:   Please see the history of present illness.     All other systems reviewed and are negative.  EKGs/Labs/Other Studies Reviewed:    The following studies were reviewed today:   EKG Interpretation Date/Time:  Wednesday July 29 2023 10:41:30 EDT Ventricular Rate:  89 PR Interval:  212 QRS Duration:  84 QT Interval:  402 QTC Calculation: 489 R Axis:   -6  Text Interpretation: Sinus rhythm with 1st degree A-V block with occasional Premature ventricular complexes Septal infarct Confirmed by Darliss Rogue ([id number]) on 07/29/2023 11:00:48 AM    Recent Labs: 09/08/2022: TSH 3.030 04/18/2023: Magnesium  2.0 05/15/2023: B Natriuretic Peptide 20.4 07/28/2023: ALT 48; BUN 26; Creatinine, Ser 1.33; Hemoglobin 15.3; Platelets 259; Potassium 3.5; Sodium 134  Recent Lipid Panel    Component Value Date/Time   CHOL 155 05/11/2023 0150   CHOL 214 (H) 09/08/2022 1030   CHOL 205  (H) 04/01/2021 1335   TRIG 222 (H) 05/11/2023 0150   TRIG 239 (H) 04/01/2021 1335   HDL 35 (L) 05/11/2023 0150   HDL 32 (L) 09/08/2022 1030   HDL 34 (L) 09/13/2011 0307   CHOLHDL 4.4 05/11/2023 0150   VLDL 44 (H) 05/11/2023 0150   VLDL 27 09/13/2011 0307   LDLCALC 76 05/11/2023 0150  LDLCALC 120 (H) 09/08/2022 1030   LDLCALC 115 (H) 09/13/2011 0307   LDLDIRECT 145.9 11/08/2013 0911     Risk Assessment/Calculations:          Physical Exam:    VS:  BP 126/72 (BP Location: Left Arm, Patient Position: Sitting, Cuff Size: Normal)   Pulse 89   Ht 6' 2 (1.88 m)   Wt (!) 312 lb (141.5 kg)   SpO2 95%   BMI 40.06 kg/m     Wt Readings from Last 3 Encounters:  07/29/23 (!) 312 lb (141.5 kg)  07/28/23 (!) 315 lb (142.9 kg)  07/27/23 (!) 314 lb 6.4 oz (142.6 kg)     GEN:  Well nourished, well developed in no acute distress HEENT: Normal NECK: No JVD; No carotid bruits CARDIAC: RRR, no murmurs, rubs, gallops RESPIRATORY:  Clear to auscultation without rales, wheezing or rhonchi  ABDOMEN: Soft, non-tender, non-distended MUSCULOSKELETAL:  No edema; left chest tender with palpation. SKIN: Warm and dry NEUROLOGIC:  Alert and oriented x 3 PSYCHIATRIC:  Normal affect   ASSESSMENT:    1. Coronary artery disease, unspecified vessel or lesion type, unspecified whether angina present, unspecified whether native or transplanted heart   2. Cerebrovascular accident (CVA), unspecified mechanism (HCC)   3. Primary hypertension   4. Mixed hyperlipidemia    PLAN:    In order of problems listed above:  Coronary calcification, CCTA 05/2023 minimal LAD stenosis.  Echo 10/24 EF 50 to 55%.  Left chest tender on palpation suggesting musculoskeletal etiology.  Continue aspirin , Plavix , Repatha , Crestor  5 mg daily.  Intolerant to higher dose of statin. CVA x 2.  Left-sided weakness.  Cardiac monitor x 4 weeks showed no A-fib or flutter.  Continue aspirin , Plavix , Repatha . Hypertension, BP  controlled.  Continue lisinopril -HCTZ 20-25 mg daily. Hyperlipidemia, not tolerant to statins.  Continue Crestor  5 mg daily, Repatha .  Follow-up in 12 months     edication Adjustments/Labs and Tests Ordered: Current medicines are reviewed at length with the patient today.  Concerns regarding medicines are outlined above.  Orders Placed This Encounter  Procedures   EKG 12-Lead   No orders of the defined types were placed in this encounter.   Patient Instructions  Medication Instructions:  Your physician recommends that you continue on your current medications as directed. Please refer to the Current Medication list given to you today.   *If you need a refill on your cardiac medications before your next appointment, please call your pharmacy*  Lab Work: No labs ordered today  If you have labs (blood work) drawn today and your tests are completely normal, you will receive your results only by: MyChart Message (if you have MyChart) OR A paper copy in the mail If you have any lab test that is abnormal or we need to change your treatment, we will call you to review the results.  Testing/Procedures: No test ordered today   Follow-Up: At [facility], you and your health needs are our priority.  As part of our continuing mission to provide you with exceptional heart care, our providers are all part of one team.  This team includes your primary Cardiologist (physician) and Advanced Practice Providers or APPs (Physician Assistants and Nurse Practitioners) who all work together to provide you with the care you need, when you need it.  Your next appointment:   1 year(s)  Provider:   You may see Redell Cave, MD or one of the following Advanced Practice Providers on your designated Care  Team:   Lonni Meager, NP Lesley Maffucci, PA-C Bernardino Bring, PA-C Cadence Franchester, PA-C Tylene Lunch, NP Barnie Hila, NP    We recommend signing up for the patient portal called  MyChart.  Sign up information is provided on this After Visit Summary.  MyChart is used to connect with patients for Virtual Visits (Telemedicine).  Patients are able to view lab/test results, encounter notes, upcoming appointments, etc.  Non-urgent messages can be sent to your provider as well.   To learn more about what you can do with MyChart, go to [email or web address].      Signed, Redell Cave, MD  07/29/2023 12:05 PM    [facility]

## 2023-07-29 NOTE — Patient Instructions (Signed)

## 2023-08-03 ENCOUNTER — Other Ambulatory Visit: Payer: Self-pay

## 2023-08-03 ENCOUNTER — Telehealth: Payer: Self-pay

## 2023-08-03 ENCOUNTER — Ambulatory Visit (INDEPENDENT_AMBULATORY_CARE_PROVIDER_SITE_OTHER): Admitting: Physician Assistant

## 2023-08-03 ENCOUNTER — Encounter: Payer: Self-pay | Admitting: Physician Assistant

## 2023-08-03 VITALS — BP 122/78 | HR 91 | Temp 97.8°F | Resp 16 | Ht 74.0 in | Wt 321.4 lb

## 2023-08-03 DIAGNOSIS — R300 Dysuria: Secondary | ICD-10-CM

## 2023-08-03 DIAGNOSIS — I6930 Unspecified sequelae of cerebral infarction: Secondary | ICD-10-CM

## 2023-08-03 DIAGNOSIS — I10 Essential (primary) hypertension: Secondary | ICD-10-CM | POA: Diagnosis not present

## 2023-08-03 DIAGNOSIS — N390 Urinary tract infection, site not specified: Secondary | ICD-10-CM | POA: Diagnosis not present

## 2023-08-03 DIAGNOSIS — E1165 Type 2 diabetes mellitus with hyperglycemia: Secondary | ICD-10-CM

## 2023-08-03 DIAGNOSIS — Z794 Long term (current) use of insulin: Secondary | ICD-10-CM | POA: Diagnosis not present

## 2023-08-03 LAB — POCT URINALYSIS DIPSTICK
Bilirubin, UA: NEGATIVE
Blood, UA: NEGATIVE
Glucose, UA: POSITIVE — AB
Ketones, UA: POSITIVE
Leukocytes, UA: NEGATIVE
Nitrite, UA: NEGATIVE
Protein, UA: NEGATIVE
Spec Grav, UA: 1.01 (ref 1.010–1.025)
Urobilinogen, UA: 0.2 U/dL
pH, UA: 5 (ref 5.0–8.0)

## 2023-08-03 LAB — POCT GLYCOSYLATED HEMOGLOBIN (HGB A1C): Hemoglobin A1C: 9.2 % — AB (ref 4.0–5.6)

## 2023-08-03 MED ORDER — DEXCOM G7 SENSOR MISC: 3 refills | Status: DC

## 2023-08-03 MED ORDER — NOVOLOG FLEXPEN 100 UNIT/ML ~~LOC~~ SOPN: PEN_INJECTOR | SUBCUTANEOUS | 11 refills | Status: DC

## 2023-08-03 NOTE — Progress Notes (Signed)
 [facility] [address] [place], [place] [address]  Internal MEDICINE  Office Visit Note  Patient Name: Gerald [name]  [id number]  [id number]  Date of Service: 08/03/2023  Chief Complaint  Patient presents with   Follow-up   Diabetes   Gastroesophageal Reflux   Depression   Hyperlipidemia   Hypertension    HPI Pt is here for routine follow up -taking 30units tresiba  and jardiance  25mg  daily, ozempic  denied and will try for trulicity  PA. Will give sample of .25 mg ozempic  in the meantime as he has responded well to GLP1 in the past -BG in AM low 200s, mealtimes in 300s, would like to short acting sliding scale--given novolog  sample and will try to send -Would benefit from CGM and will try to send -declines endo referral at this time -Having some pain in right upper abdomen radiating to back esp on right side, does have some frequency and incontinence. Will be seeing urology on Thursday but will check urine and send for culture if needed. He is already on Bactrim  -will be finding eye doctor for exam soon -was in the ED for tremor/loss of control of hand function. States holding fork and dropped it and holding drink and then arm dropped. He is followed by neurology -neurology will be doing nerve block on wed -PT twice per week, walking with cane for stability -states working on disability -just saw cardiology and states it went well  Current Medication: Outpatient Encounter Medications as of 08/03/2023  Medication Sig   aspirin  EC 81 MG tablet Take 1 tablet (81 mg total) by mouth daily. Swallow whole.   citalopram  (CELEXA ) 40 MG tablet TAKE 1 TABLET BY MOUTH EVERY DAY   clopidogrel  (PLAVIX ) 75 MG tablet Take 1 tablet (75 mg total) by mouth daily.   cyclobenzaprine  (FLEXERIL ) 10 MG tablet TAKE 1 TABLET (10 MG TOTAL) BY MOUTH AT BEDTIME FOR BACK SPASM AS NEEDED ONLY   Dulaglutide  (TRULICITY ) 0.75 MG/0.5ML SOAJ Inject 0.75 mg into the skin once a week.    empagliflozin  (JARDIANCE ) 25 MG TABS tablet Take 1 tablet (25 mg total) by mouth daily before breakfast.   Evolocumab  (REPATHA  SURECLICK) 140 MG/ML SOAJ Inject 140 mg into the skin every 14 (fourteen) days.   furosemide  (LASIX ) 20 MG tablet TAKE 1 TABLET BY MOUTH EVERY DAY AS NEEDED FOR LEG SWELLING   gabapentin  (NEURONTIN ) 300 MG capsule TAKE 1 CAPSULE BY MOUTH THREE TIMES A DAY   insulin  aspart (NOVOLOG  FLEXPEN) 100 UNIT/ML FlexPen Inject with meals per sliding scale. Max 14 units   insulin  degludec (TRESIBA  FLEXTOUCH) 100 UNIT/ML FlexTouch Pen Inject 10 Units into the skin daily.   Insulin  Pen Needle (BD PEN NEEDLE NANO U/F) 32G X 4 MM MISC Use as directed once a daily   lamoTRIgine  (LAMICTAL ) 25 MG tablet Take 1 tablet (25 mg total) by mouth at bedtime for 14 days, THEN 1 tablet (25 mg total) 2 (two) times daily for 14 days.   lisinopril -hydrochlorothiazide  (ZESTORETIC ) 20-25 MG tablet TAKE 1 TABLET BY MOUTH EVERY DAY   metoprolol  tartrate (LOPRESSOR ) 100 MG tablet TAKE 1 TABLET 2 HR PRIOR TO CARDIAC PROCEDURE   Multiple Vitamins-Minerals (MULTIVITAMIN ADULTS PO) Take 1 tablet by mouth daily.   nitroGLYCERIN  (NITROSTAT ) 0.4 MG SL tablet Place 1 tablet (0.4 mg total) under the tongue every 5 (five) minutes x 3 doses as needed for chest pain.   nortriptyline (PAMELOR) 10 MG capsule Take 10 mg by mouth at bedtime.   nystatin -triamcinolone  ointment (  MYCOLOG) Apply 1 Application topically 2 (two) times daily.   rosuvastatin  (CRESTOR ) 5 MG tablet Take 1 tablet (5 mg total) by mouth at bedtime.   sulfamethoxazole -trimethoprim  (BACTRIM  DS) 800-160 MG tablet Take 1 tablet by mouth every 12 (twelve) hours.   Vibegron  (GEMTESA ) 75 MG TABS Take 1 tablet (75 mg total) by mouth daily.   Continuous Glucose Sensor (DEXCOM G7 SENSOR) MISC Use every 10 days as directed. Dx: E11.65.   No facility-administered encounter medications on file as of 08/03/2023.    Surgical History: Past Surgical History:   Procedure Laterality Date   BONE EXCISION Right 11/21/2020   Procedure: PART EXCISION BONE- PHALANX;  Surgeon: Ashley Soulier, DPM;  Location: [facility];  Service: Podiatry;  Laterality: Right;   CARDIAC CATHETERIZATION N/A 09/2011   [facility]; EF 50% with 30% mid LAD stenosis and no obstructive disease.   CARDIAC CATHETERIZATION  09/2010   [facility]; Mid LAD 40% stenosis; Mid Circumflex:Normal; Mid RCA; Normal   CARDIAC CATHETERIZATION     COLONOSCOPY     COLONOSCOPY WITH PROPOFOL  N/A 04/10/2021   Procedure: COLONOSCOPY WITH PROPOFOL ;  Surgeon: Therisa Bi, MD;  Location: [facility] ENDOSCOPY;  Service: Gastroenterology;  Laterality: N/A;   ESOPHAGOGASTRODUODENOSCOPY N/A 04/10/2021   Procedure: ESOPHAGOGASTRODUODENOSCOPY (EGD);  Surgeon: Therisa Bi, MD;  Location: [facility] ENDOSCOPY;  Service: Gastroenterology;  Laterality: N/A;   ESOPHAGOGASTRODUODENOSCOPY (EGD) WITH PROPOFOL  N/A 06/10/2017   Procedure: ESOPHAGOGASTRODUODENOSCOPY (EGD) WITH PROPOFOL ;  Surgeon: Therisa Bi, MD;  Location: [facility] ENDOSCOPY;  Service: Gastroenterology;  Laterality: N/A;   ESOPHAGOGASTRODUODENOSCOPY (EGD) WITH PROPOFOL  N/A 01/01/2022   Procedure: ESOPHAGOGASTRODUODENOSCOPY (EGD) WITH PROPOFOL ;  Surgeon: Therisa Bi, MD;  Location: [facility] ENDOSCOPY;  Service: Gastroenterology;  Laterality: N/A;   ESOPHAGOGASTRODUODENOSCOPY (EGD) WITH PROPOFOL  N/A 01/06/2022   Procedure: ESOPHAGOGASTRODUODENOSCOPY (EGD) WITH PROPOFOL ;  Surgeon: Therisa Bi, MD;  Location: [facility] ENDOSCOPY;  Service: Gastroenterology;  Laterality: N/A;   HAMMER TOE SURGERY Right 11/21/2020   Procedure: HAMMERTOE CORRECTION;  Surgeon: Ashley Soulier, DPM;  Location: [facility];  Service: Podiatry;  Laterality: Right;   INSERTION OF MESH  04/16/2021   Procedure: INSERTION OF MESH;  Surgeon: Desiderio Schanz, MD;  Location: [facility] ORS;  Service: General;;   KNEE ARTHROSCOPY WITH MEDIAL MENISECTOMY Right 09/16/2012   Procedure: RIGHT KNEE ARTHROSCOPY WITH MEDIAL AND  LATERAL MENISECTOMY, CHONDROPLASTY;  Surgeon: Toribio JULIANNA Chancy, MD;  Location: [facility];  Service: Orthopedics;  Laterality: Right;  RIGHT KNEE SCOPE MEDIAL MENISCECTOMY   LEFT HEART CATH AND CORONARY ANGIOGRAPHY N/A 06/11/2016   Procedure: Left Heart Cath and Coronary Angiography;  Surgeon: Perla Evalene PARAS, MD;  Location: [facility] INVASIVE CV LAB;  Service: Cardiovascular;  Laterality: N/A;   UMBILICAL HERNIA REPAIR N/A 04/16/2021   Procedure: HERNIA REPAIR UMBILICAL ADULT, open;  Surgeon: Desiderio Schanz, MD;  Location: [facility] ORS;  Service: General;  Laterality: N/A;    Medical History: Past Medical History:  Diagnosis Date   Abnormal LFTs    Anxiety    Atypical chest pain    Borderline diabetes    CAD (coronary artery disease)    a.) PCI and stent placement (unknown type) to mLAD and mLCx; date unknown. b.) LHC 09/10/2010: 40% mLAD; no intervention. c.) LHC 09/12/2011: EF 60%; 30% ISR mLAD, 40% dLAD, 30% pLCx, 30% ISR mLCx, 40% mRCA; no intervention. d.) LHC 05/23/2014: EF >55%; 30% p-mLAD; no intervention. e.) LHC 06/11/2016: EF 55-65%; LVEDP norm; 30-40% p-mLAD; no intervention.   Carcinoma (HCC)    CHF (congestive heart failure) (HCC)  Chronic pain syndrome    Depression    Diabetes mellitus without complication (HCC)    Fundic gland polyps of stomach, benign    Gastritis    GERD (gastroesophageal reflux disease)    Hepatic steatosis    Hyperlipidemia    Hypertension    Migraines    Myocardial infarction [facility]) 2008   was told by PCP   Nonischemic cardiomyopathy (HCC)    a.) TTE 09/12/2011: EF 35-45%. b.) LHC 09/12/2011: EF 60%. c.) TTE 06/04/2012: EF 55-60%. d.) LHC 05/23/2014: EF >55%. e.) TTE 06/11/2016: EF 55-60%; G1DD. f.) LHC 06/11/2016: EF 55-65%. g.) TTE 01/08/2018: EF 60-65%.   Obesity    Occlusion and stenosis of bilateral carotid arteries    OSA on CPAP    Osteoarthritis of both knees    Shortness of breath    Squamous cell carcinoma of skin 02/27/2020    left distal lat deltoid - [facility]   Stroke (HCC)    10/24, 3/25    Family History: Family History  Problem Relation Age of Onset   Heart disease Father 77       MI   Heart attack Father    Stomach cancer Father    Hypertension Mother    Breast cancer Mother     Social History   Socioeconomic History   Marital status: Married    Spouse name: Not on file   Number of children: 3   Years of education: Not on file   Highest education level: Not on file  Occupational History   Occupation: Camera operator  Tobacco Use   Smoking status: Never    Passive exposure: Past   Smokeless tobacco: Former    Types: Chew    Quit date: 1987  Vaping Use   Vaping status: Never Used  Substance and Sexual Activity   Alcohol use: Not Currently   Drug use: No   Sexual activity: Yes  Other Topics Concern   Not on file  Social History Narrative   Married.  72 yo son.   Wife on disability for bipolar disorder.     Social Drivers of Corporate investment banker Strain: Not on file  Food Insecurity: Food Insecurity Present (04/17/2023)   Hunger Vital Sign    Worried About Running Out of Food in the Last Year: Sometimes true    Ran Out of Food in the Last Year: Sometimes true  Transportation Needs: No Transportation Needs (04/17/2023)   PRAPARE - Administrator, Civil Service (Medical): No    Lack of Transportation (Non-Medical): No  Physical Activity: Not on file  Stress: Not on file  Social Connections: Unknown (11/18/2021)   Received from [facility]   Social Network    Social Network: Not on file  Intimate Partner Violence: Not At Risk (04/17/2023)   Humiliation, Afraid, Rape, and Kick questionnaire    Fear of Current or Ex-Partner: No    Emotionally Abused: No    Physically Abused: No    Sexually Abused: No      Review of Systems  Constitutional:  Positive for fatigue. Negative for chills and unexpected weight change.  HENT:  Negative for congestion, postnasal  drip, rhinorrhea, sneezing and sore throat.   Eyes:  Negative for redness.  Respiratory:  Negative for cough, chest tightness and shortness of breath.   Cardiovascular:  Negative for chest pain and palpitations.  Gastrointestinal:  Positive for abdominal pain. Negative for anal bleeding, constipation, diarrhea, nausea and vomiting.  Genitourinary:  Positive for flank pain, frequency and urgency. Negative for dysuria.  Musculoskeletal:  Positive for arthralgias, back pain, gait problem and myalgias. Negative for joint swelling and neck pain.  Skin:  Negative for rash.  Neurological:  Positive for weakness and numbness.  Hematological:  Negative for adenopathy. Does not bruise/bleed easily.  Psychiatric/Behavioral:  Positive for behavioral problems (Depression) and sleep disturbance. Negative for self-injury and suicidal ideas. The patient is nervous/anxious.     Vital Signs: BP 122/78 Comment: 131/90  Pulse 91   Temp 97.8 F (36.6 C)   Resp 16   Ht 6' 2 (1.88 m)   Wt (!) 321 lb 6.4 oz (145.8 kg)   SpO2 96%   BMI 41.27 kg/m    Physical Exam Vitals and nursing note reviewed.  Constitutional:      General: He is not in acute distress.    Appearance: Normal appearance. He is well-developed. He is obese. He is not diaphoretic.  HENT:     Head: Normocephalic and atraumatic.  Neck:     Thyroid : No thyromegaly.     Vascular: No JVD.     Trachea: No tracheal deviation.   Cardiovascular:     Rate and Rhythm: Normal rate and regular rhythm.     Heart sounds: Normal heart sounds. No murmur heard.    No friction rub. No gallop.  Pulmonary:     Effort: Pulmonary effort is normal. No respiratory distress.     Breath sounds: No wheezing or rales.  Chest:     Chest wall: No tenderness.  Abdominal:     Tenderness: There is no abdominal tenderness. There is right CVA tenderness.   Musculoskeletal:     Cervical back: Normal range of motion and neck supple.  Lymphadenopathy:      Cervical: No cervical adenopathy.   Skin:    General: Skin is warm and dry.   Neurological:     Mental Status: He is alert and oriented to person, place, and time.     Cranial Nerves: No cranial nerve deficit.     Sensory: Sensory deficit present.     Gait: Gait abnormal.     Comments: Using cane  Psychiatric:        Thought Content: Thought content normal.        Judgment: Judgment normal.        Assessment/Plan: 1. Type 2 diabetes mellitus with hyperglycemia, with long-term current use of insulin  (HCC) (Primary) - Urine Microalbumin w/creat. ratio - POCT HgB A1C is 9.2. Will add novolog  for sliding scale at meal times. Will also send CGM for better management and to avoid lows as well with increased medication. Will do PA for trulicity , given ozempic  sample in meantime - insulin  aspart (NOVOLOG  FLEXPEN) 100 UNIT/ML FlexPen; Inject with meals per sliding scale. Max 14 units  Dispense: 15 mL; Refill: 11  2. Essential hypertension Stable, continue current medication  3. History of stroke with current residual effects Followed by neurology  4. Dysuria Will check urine, pt has appt with urology in a few days to address further given flank pain with urinary symptoms.  - POCT Urinalysis Dipstick   General Counseling: Quintin oakland understanding of the findings of todays visit and agrees with plan of treatment. I have discussed any further diagnostic evaluation that may be needed or ordered today. We also reviewed his medications today. he has been encouraged to call the office with any questions or concerns that should arise related to todays visit.  Orders Placed This Encounter  Procedures   CULTURE, URINE COMPREHENSIVE   Urine Microalbumin w/creat. ratio   POCT HgB A1C   POCT Urinalysis Dipstick    Meds ordered this encounter  Medications   insulin  aspart (NOVOLOG  FLEXPEN) 100 UNIT/ML FlexPen    Sig: Inject with meals per sliding scale. Max 14 units     Dispense:  15 mL    Refill:  11   Continuous Glucose Sensor (DEXCOM G7 SENSOR) MISC    Sig: Use every 10 days as directed. Dx: E11.65.    Dispense:  4 each    Refill:  3    This patient was seen by Tinnie Pro, PA-C in collaboration with Dr. Sigrid Bathe as a part of collaborative care agreement.   Total time spent:30 Minutes Time spent includes review of chart, medications, test results, and follow up plan with the patient.      Dr Fozia M Khan Internal medicine

## 2023-08-03 NOTE — Telephone Encounter (Signed)
Completed P.A. for patient's Trulicity.

## 2023-08-04 LAB — MICROALBUMIN / CREATININE URINE RATIO
Creatinine, Urine: 51.5 mg/dL
Microalb/Creat Ratio: <6 mg/g{creat} (ref 0–29)
Microalbumin, Urine: <3 ug/mL

## 2023-08-06 ENCOUNTER — Other Ambulatory Visit: Payer: Self-pay | Admitting: Urology

## 2023-08-06 ENCOUNTER — Encounter: Payer: Self-pay | Admitting: Urology

## 2023-08-06 ENCOUNTER — Ambulatory Visit: Admitting: Urology

## 2023-08-06 ENCOUNTER — Telehealth: Payer: Self-pay | Admitting: Urology

## 2023-08-06 VITALS — BP 148/91 | HR 74 | Ht 74.0 in | Wt 321.0 lb

## 2023-08-06 DIAGNOSIS — N412 Abscess of prostate: Secondary | ICD-10-CM

## 2023-08-06 DIAGNOSIS — N476 Balanoposthitis: Secondary | ICD-10-CM

## 2023-08-06 NOTE — Telephone Encounter (Signed)
 The patient called to inquire if an order for a CT scan could be placed. They mentioned there was a prior discussion about getting a CT scan and would like to know if it can be arranged. The patient is eager to have the CT scan done as soon as possible.

## 2023-08-06 NOTE — Progress Notes (Addendum)
 08/06/2023 11:02 AM   [name] March 03, 1965 [id number]  Referring provider: Kristina Tinnie POUR, PA-C [address] [place],  [place] [address]  Urological history: 1. ED - Sildenafil  50 mg, on-demand dosing  2. BPH with LU TS - PSA (2024) 1.4   3. Hypogonadism - discontinued TRT secondary to stroke  4. Elevated PSA  - PSA (2023) 5.7   Chief Complaint  Patient presents with   Follow-up   HPI: Gerald [name] is a 58 y.o. man who presents today for two week follow up after starting Mycolog cream for balanoposthitis.      Previous records reviewed.   Starting in April of this year,  he was experiencing urinary urgency, urge incontinence, postvoid dribbling and urinary frequency that worsened to the point where he now has to sit to void otherwise he sprays all over associated with genital pain and balanoposthitis.  His PVR is demonstrated adequate emptying.  Urinalysis have not been suspicious for infection.  His urine cultures have been negative.   He has failed Gemtesa  75 mg daily.  At his last appointment it was noted that his prostate was tender on DRE, so he was started on Septra  DS twice daily for 60 days.  He also was noted to have the balanoposthitis, so Mycolog cream was prescribed.    He has been applying the Mycolog cream, but he states he has had no improvement of the pain in the tip of his penis.  He also has been taking the Septra  DS twice daily with no improvement of his symptoms.  He states he still has rectal pain, still needing to sit to void otherwise he sprays all over, urgency, frequency and nocturia.   He recently saw his PCP and was experiencing burning during that visit.  Urine dip was unremarkable.  There are urine culture is pending.   He has also been experiencing night sweats.  PMH: Past Medical History:  Diagnosis Date   Abnormal LFTs    Anxiety    Atypical chest pain    Borderline diabetes    CAD (coronary artery disease)     a.) PCI and stent placement (unknown type) to mLAD and mLCx; date unknown. b.) LHC 09/10/2010: 40% mLAD; no intervention. c.) LHC 09/12/2011: EF 60%; 30% ISR mLAD, 40% dLAD, 30% pLCx, 30% ISR mLCx, 40% mRCA; no intervention. d.) LHC 05/23/2014: EF >55%; 30% p-mLAD; no intervention. e.) LHC 06/11/2016: EF 55-65%; LVEDP norm; 30-40% p-mLAD; no intervention.   Carcinoma (HCC)    CHF (congestive heart failure) (HCC)    Chronic pain syndrome    Depression    Diabetes mellitus without complication (HCC)    Fundic gland polyps of stomach, benign    Gastritis    GERD (gastroesophageal reflux disease)    Hepatic steatosis    Hyperlipidemia    Hypertension    Migraines    Myocardial infarction [facility]) 2008   was told by PCP   Nonischemic cardiomyopathy (HCC)    a.) TTE 09/12/2011: EF 35-45%. b.) LHC 09/12/2011: EF 60%. c.) TTE 06/04/2012: EF 55-60%. d.) LHC 05/23/2014: EF >55%. e.) TTE 06/11/2016: EF 55-60%; G1DD. f.) LHC 06/11/2016: EF 55-65%. g.) TTE 01/08/2018: EF 60-65%.   Obesity    Occlusion and stenosis of bilateral carotid arteries    OSA on CPAP    Osteoarthritis of both knees    Shortness of breath    Squamous cell carcinoma of skin 02/27/2020   left distal lat deltoid - EDC  Stroke [facility])    10/24, 3/25    Surgical History: Past Surgical History:  Procedure Laterality Date   BONE EXCISION Right 11/21/2020   Procedure: PART EXCISION BONE- PHALANX;  Surgeon: Ashley Soulier, DPM;  Location: [facility]ERY [facility];  Service: Podiatry;  Laterality: Right;   CARDIAC CATHETERIZATION N/A 09/2011   [facility]; EF 50% with 30% mid LAD stenosis and no obstructive disease.   CARDIAC CATHETERIZATION  09/2010   [facility]; Mid LAD 40% stenosis; Mid Circumflex:Normal; Mid RCA; Normal   CARDIAC CATHETERIZATION     COLONOSCOPY     COLONOSCOPY WITH PROPOFOL  N/A 04/10/2021   Procedure: COLONOSCOPY WITH PROPOFOL ;  Surgeon: Therisa Bi, MD;  Location: [facility] ENDOSCOPY;  Service: Gastroenterology;  Laterality: N/A;    ESOPHAGOGASTRODUODENOSCOPY N/A 04/10/2021   Procedure: ESOPHAGOGASTRODUODENOSCOPY (EGD);  Surgeon: Therisa Bi, MD;  Location: [facility] ENDOSCOPY;  Service: Gastroenterology;  Laterality: N/A;   ESOPHAGOGASTRODUODENOSCOPY (EGD) WITH PROPOFOL  N/A 06/10/2017   Procedure: ESOPHAGOGASTRODUODENOSCOPY (EGD) WITH PROPOFOL ;  Surgeon: Therisa Bi, MD;  Location: [facility] ENDOSCOPY;  Service: Gastroenterology;  Laterality: N/A;   ESOPHAGOGASTRODUODENOSCOPY (EGD) WITH PROPOFOL  N/A 01/01/2022   Procedure: ESOPHAGOGASTRODUODENOSCOPY (EGD) WITH PROPOFOL ;  Surgeon: Therisa Bi, MD;  Location: [facility] ENDOSCOPY;  Service: Gastroenterology;  Laterality: N/A;   ESOPHAGOGASTRODUODENOSCOPY (EGD) WITH PROPOFOL  N/A 01/06/2022   Procedure: ESOPHAGOGASTRODUODENOSCOPY (EGD) WITH PROPOFOL ;  Surgeon: Therisa Bi, MD;  Location: [facility] ENDOSCOPY;  Service: Gastroenterology;  Laterality: N/A;   HAMMER TOE SURGERY Right 11/21/2020   Procedure: HAMMERTOE CORRECTION;  Surgeon: Ashley Soulier, DPM;  Location: [facility] SUR[facility];  Service: Podiatry;  Laterality: Right;   INSERTION OF MESH  04/16/2021   Procedure: INSERTION OF MESH;  Surgeon: Desiderio Schanz, MD;  Location: [facility] ORS;  Service: General;;   KNEE ARTHROSCOPY WITH MEDIAL MENISECTOMY Right 09/16/2012   Procedure: RIGHT KNEE ARTHROSCOPY WITH MEDIAL AND LATERAL MENISECTOMY, CHONDROPLASTY;  Surgeon: Toribio JULIANNA Chancy, MD;  Location: [facility];  Service: Orthopedics;  Laterality: Right;  RIGHT KNEE SCOPE MEDIAL MENISCECTOMY   LEFT HEART CATH AND CORONARY ANGIOGRAPHY N/A 06/11/2016   Procedure: Left Heart Cath and Coronary Angiography;  Surgeon: Perla Evalene PARAS, MD;  Location: [facility] INVASIVE CV LAB;  Service: Cardiovascular;  Laterality: N/A;   UMBILICAL HERNIA REPAIR N/A 04/16/2021   Procedure: HERNIA REPAIR UMBILICAL ADULT, open;  Surgeon: Desiderio Schanz, MD;  Location: [facility] ORS;  Service: General;  Laterality: N/A;    Home Medications:  Allergies as of 08/06/2023       Reactions    Nexlizet  [bempedoic Acid-ezetimibe ] Rash   Statins Hives, Rash   For high dose statin        Medication List        Accurate as of August 06, 2023 11:02 AM. If you have any questions, ask your nurse or doctor.          aspirin  EC 81 MG tablet Take 1 tablet (81 mg total) by mouth daily. Swallow whole.   BD Pen Needle Nano U/F 32G X 4 MM Misc Generic drug: Insulin  Pen Needle Use as directed once a daily   citalopram  40 MG tablet Commonly known as: CELEXA  TAKE 1 TABLET BY MOUTH EVERY DAY   clopidogrel  75 MG tablet Commonly known as: PLAVIX  Take 1 tablet (75 mg total) by mouth daily.   cyclobenzaprine  10 MG tablet Commonly known as: FLEXERIL  TAKE 1 TABLET (10 MG TOTAL) BY MOUTH AT BEDTIME FOR BACK SPASM AS NEEDED ONLY   Dexcom G7 Sensor Misc Use every 10 days as directed. Dx: E11.65.  empagliflozin  25 MG Tabs tablet Commonly known as: Jardiance  Take 1 tablet (25 mg total) by mouth daily before breakfast.   furosemide  20 MG tablet Commonly known as: LASIX  TAKE 1 TABLET BY MOUTH EVERY DAY AS NEEDED FOR LEG SWELLING   gabapentin  300 MG capsule Commonly known as: NEURONTIN  TAKE 1 CAPSULE BY MOUTH THREE TIMES A DAY   Gemtesa  75 MG Tabs Generic drug: Vibegron  Take 1 tablet (75 mg total) by mouth daily.   lamoTRIgine  25 MG tablet Commonly known as: LAMICTAL  Take 1 tablet (25 mg total) by mouth at bedtime for 14 days, THEN 1 tablet (25 mg total) 2 (two) times daily for 14 days. Start taking on: April 18, 2023   lisinopril -hydrochlorothiazide  20-25 MG tablet Commonly known as: ZESTORETIC  TAKE 1 TABLET BY MOUTH EVERY DAY   metoprolol  tartrate 100 MG tablet Commonly known as: LOPRESSOR  TAKE 1 TABLET 2 HR PRIOR TO CARDIAC PROCEDURE   MULTIVITAMIN ADULTS PO Take 1 tablet by mouth daily.   nitroGLYCERIN  0.4 MG SL tablet Commonly known as: NITROSTAT  Place 1 tablet (0.4 mg total) under the tongue every 5 (five) minutes x 3 doses as needed for chest pain.    nortriptyline 10 MG capsule Commonly known as: PAMELOR Take 10 mg by mouth at bedtime.   NovoLOG  FlexPen 100 UNIT/ML FlexPen Generic drug: insulin  aspart Inject with meals per sliding scale. Max 14 units   nystatin -triamcinolone  ointment Commonly known as: MYCOLOG Apply 1 Application topically 2 (two) times daily.   Repatha  SureClick 140 MG/ML Soaj Generic drug: Evolocumab  Inject 140 mg into the skin every 14 (fourteen) days.   rosuvastatin  5 MG tablet Commonly known as: CRESTOR  Take 1 tablet (5 mg total) by mouth at bedtime.   sulfamethoxazole -trimethoprim  800-160 MG tablet Commonly known as: BACTRIM  DS Take 1 tablet by mouth every 12 (twelve) hours.   Tresiba  FlexTouch 100 UNIT/ML FlexTouch Pen Generic drug: insulin  degludec Inject 10 Units into the skin daily.   Trulicity  0.75 MG/0.5ML Soaj Generic drug: Dulaglutide  Inject 0.75 mg into the skin once a week.        Allergies:  Allergies  Allergen Reactions   Nexlizet  [Bempedoic Acid-Ezetimibe ] Rash   Statins Hives and Rash    For high dose statin    Family History: Family History  Problem Relation Age of Onset   Heart disease Father 34       MI   Heart attack Father    Stomach cancer Father    Hypertension Mother    Breast cancer Mother     Social History:  reports that he has never smoked. He has been exposed to tobacco smoke. He quit smokeless tobacco use about 38 years ago.  His smokeless tobacco use included chew. He reports that he does not currently use alcohol. He reports that he does not use drugs.  ROS: Pertinent ROS in HPI  Physical Exam: BP (!) 148/91   Pulse 74   Ht 6' 2 (1.88 m)   Wt (!) 321 lb (145.6 kg)   BMI 41.21 kg/m   Constitutional:  Well nourished. Alert and oriented, No acute distress. HEENT: [place] AT, moist mucus membranes.  Trachea midline Cardiovascular: No clubbing, cyanosis, or edema. Respiratory: Normal respiratory effort, no increased work of breathing. GU: No CVA  tenderness.  No bladder fullness or masses.  Patient with uncircumcised phallus.  Foreskin easily retracted  Urethral meatus is patent.  No penile discharge. No penile lesions or rashes.   Balanoposthitis has appeared to have resolved.  Scrotum without lesions, cysts, rashes and/or edema.  Testicles are located scrotally bilaterally. No masses are appreciated in the testicles. Left and right epididymis are normal. Rectal: Patient started to complain of pain just upon spreading the buttocks for the rectal exam.  He states the anus, rectum and prostate were exquisitely tender during the exam.  He had a normal sphincter tone. Anus and perineum without scarring or rashes.  Neurologic: Grossly intact, no focal deficits, moving all 4 extremities. Psychiatric: Normal mood and affect.   Laboratory Data: Results for orders placed or performed in visit on 08/03/23  Urine Microalbumin w/creat. ratio   Collection Time: 08/03/23  2:20 PM  Result Value Ref Range   Creatinine, Urine 51.5 Not Estab. mg/dL   Microalbumin, Urine <6.9 Not Estab. ug/mL   Microalb/Creat Ratio <6 0 - 29 mg/g creat  POCT HgB A1C   Collection Time: 08/03/23  2:20 PM  Result Value Ref Range   Hemoglobin A1C 9.2 (A) 4.0 - 5.6 %   HbA1c POC (<> result, manual entry)     HbA1c, POC (prediabetic range)     HbA1c, POC (controlled diabetic range)    POCT Urinalysis Dipstick   Collection Time: 08/03/23  3:57 PM  Result Value Ref Range   Color, UA     Clarity, UA     Glucose, UA Positive (A) Negative   Bilirubin, UA Negative    Ketones, UA Positive    Spec Grav, UA 1.010 1.010 - 1.025   Blood, UA Negative    pH, UA 5.0 5.0 - 8.0   Protein, UA Negative Negative   Urobilinogen, UA 0.2 0.2 or 1.0 E.U./dL   Nitrite, UA Negative    Leukocytes, UA Negative Negative   Appearance     Odor    CULTURE, URINE COMPREHENSIVE   Collection Time: 08/03/23  4:01 PM   Specimen: Urine   Urine  Result Value Ref Range   Urine Culture,  Comprehensive Preliminary report    Organism ID, Bacteria Comment   I have reviewed the labs.  See HPI.     Pertinent Imaging: See above   Assessment & Plan:    1. Balanoposthitis - Appears resolved on today's exam, but patient still complains of pain at the tip of the penis which is probably referred pain -  Encouraged better diabetes control - Continue Mycolog cream twice daily for 2 weeks  2.  Prostate abscess - DRE with exquisitely tender prostate - Continue Septra  DS twice daily for 30 days - I am very concerned for the formation of a possible prostate abscess.  Patient is a uncontrolled diabetic who is on Jardiance , which has a known side effect for the increased risk for genitourinary infections, his rectal exam was exquisitely tender and he is experiencing profuse sweating which I believe is the result of a fever during the night - We will need to move forward with a stat CT of the pelvis with and without contrast to evaluate for a prostate abscess, waiting on insurance approval    Return for STAT CT to evaluate for abscess .  These notes generated with voice recognition software. I apologize for typographical errors.  CLOTILDA HELON RIGGERS  [facility] [address]  [address] [place], [place] [address] [phone]  Addendum:   We attempted to get a stat CT for the patient today, but his insurance would not approve it.  We will continue to work on getting improved on our end, but  it looks like it will not happen until next week.  In the meantime, I advised the patient to continue his Septra  DS twice daily.  I also advised him if he started to spike fevers, having increased perineal pain, incontinence of urine or stool, to seek treatment in the ED immediately.

## 2023-08-06 NOTE — Progress Notes (Deleted)
 SABRA

## 2023-08-08 ENCOUNTER — Other Ambulatory Visit: Payer: Self-pay | Admitting: Physician Assistant

## 2023-08-10 ENCOUNTER — Other Ambulatory Visit: Payer: Self-pay

## 2023-08-10 ENCOUNTER — Ambulatory Visit
Admission: RE | Admit: 2023-08-10 | Discharge: 2023-08-10 | Disposition: A | Discharge status: Home / Self Care | Source: Ambulatory Visit | Attending: Urology | Admitting: Urology

## 2023-08-10 ENCOUNTER — Ambulatory Visit: Payer: Self-pay | Admitting: Urology

## 2023-08-10 DIAGNOSIS — N412 Abscess of prostate: Secondary | ICD-10-CM | POA: Insufficient documentation

## 2023-08-10 DIAGNOSIS — Z0389 Encounter for observation for other suspected diseases and conditions ruled out: Secondary | ICD-10-CM | POA: Diagnosis not present

## 2023-08-10 MED ORDER — IOHEXOL 350 MG/ML SOLN
100.0000 mL | Freq: Once | INTRAVENOUS | Status: AC | PRN
  Administered 2023-08-10: 100 mL via INTRAVENOUS

## 2023-08-12 ENCOUNTER — Ambulatory Visit: Payer: Self-pay | Admitting: Physician Assistant

## 2023-08-12 LAB — CULTURE, URINE COMPREHENSIVE

## 2023-08-12 NOTE — Telephone Encounter (Signed)
-----   Message from Tinnie MARLA Pro sent at 08/12/2023  2:32 PM EDT ----- Please let him know that his culture did come back with a very low amount of growth, not sure if his urologist discussed this any with him, but can send macrobid since he was symptomatic ----- Message ----- From: Almer Bi, CMA Sent: 08/03/2023   2:22 PM EDT To: Tinnie MARLA Pro, PA-C

## 2023-08-12 NOTE — Telephone Encounter (Signed)
 Spoke with patient regarding urine culture, given ABX by urology. Also rescheduled patient's upcoming physical since patient will be having a procedure.

## 2023-08-23 ENCOUNTER — Other Ambulatory Visit: Payer: Self-pay | Admitting: Urology

## 2023-08-23 DIAGNOSIS — N418 Other inflammatory diseases of prostate: Secondary | ICD-10-CM

## 2023-08-24 ENCOUNTER — Telehealth: Payer: Self-pay | Admitting: Physician Assistant

## 2023-08-24 NOTE — Telephone Encounter (Addendum)
 Received request for 04/09/2022 to current MR from [facility]  Fax not going through. Mailed to:  [id number] [place] [address] [place], [place] [address]

## 2023-08-27 ENCOUNTER — Other Ambulatory Visit: Payer: Self-pay | Admitting: Physician Assistant

## 2023-08-27 DIAGNOSIS — G479 Sleep disorder, unspecified: Secondary | ICD-10-CM | POA: Diagnosis not present

## 2023-08-27 DIAGNOSIS — M5481 Occipital neuralgia: Secondary | ICD-10-CM | POA: Diagnosis not present

## 2023-08-27 DIAGNOSIS — E1165 Type 2 diabetes mellitus with hyperglycemia: Secondary | ICD-10-CM

## 2023-08-27 DIAGNOSIS — F4321 Adjustment disorder with depressed mood: Secondary | ICD-10-CM | POA: Diagnosis not present

## 2023-08-27 DIAGNOSIS — R2689 Other abnormalities of gait and mobility: Secondary | ICD-10-CM | POA: Diagnosis not present

## 2023-08-27 DIAGNOSIS — I6930 Unspecified sequelae of cerebral infarction: Secondary | ICD-10-CM | POA: Diagnosis not present

## 2023-08-27 DIAGNOSIS — I69398 Other sequelae of cerebral infarction: Secondary | ICD-10-CM | POA: Diagnosis not present

## 2023-08-27 DIAGNOSIS — R269 Unspecified abnormalities of gait and mobility: Secondary | ICD-10-CM | POA: Diagnosis not present

## 2023-08-27 DIAGNOSIS — R531 Weakness: Secondary | ICD-10-CM | POA: Diagnosis not present

## 2023-08-27 DIAGNOSIS — R52 Pain, unspecified: Secondary | ICD-10-CM | POA: Diagnosis not present

## 2023-08-27 DIAGNOSIS — R200 Anesthesia of skin: Secondary | ICD-10-CM | POA: Diagnosis not present

## 2023-09-07 ENCOUNTER — Encounter: Payer: 59 | Admitting: Physician Assistant

## 2023-09-09 ENCOUNTER — Encounter: Payer: Self-pay | Admitting: Urology

## 2023-09-09 ENCOUNTER — Ambulatory Visit (INDEPENDENT_AMBULATORY_CARE_PROVIDER_SITE_OTHER): Admitting: Urology

## 2023-09-09 VITALS — BP 130/81 | HR 74 | Ht 74.0 in | Wt 315.0 lb

## 2023-09-09 DIAGNOSIS — R399 Unspecified symptoms and signs involving the genitourinary system: Secondary | ICD-10-CM | POA: Diagnosis not present

## 2023-09-09 MED ORDER — LIDOCAINE HCL URETHRAL/MUCOSAL 2 % EX GEL
1.0000 | Freq: Once | CUTANEOUS | Status: AC
  Administered 2023-09-09: 1 via URETHRAL

## 2023-09-09 NOTE — Progress Notes (Signed)
 Cystoscopy Procedure Note:  Indication: Urinary frequency, urgency  After informed consent and discussion of the procedure and its risks, Gerald [name] was positioned and prepped in the standard fashion. Cystoscopy was performed with a flexible cystoscope. The urethra, bladder neck and entire bladder was visualized in a standard fashion. The prostate was moderate in size. The ureteral orifices were visualized in their normal location and orientation.  Bladder mucosa grossly normal throughout, no abnormalities on retroflexion.  Imaging: CT pelvis with no abnormalities  Findings: Normal cystoscopy  Assessment and Plan: Pain and dysuria has improved with Bactrim , urine culture end of June grew small amount of Enterococcus, if persistent symptoms could trial course of Cipro or amoxicillin  for more of an Enterococcus prostatitis picture, as this would not be covered by Bactrim    Gerald Burnet, MD 09/09/2023

## 2023-09-10 ENCOUNTER — Encounter: Payer: Self-pay | Admitting: Physician Assistant

## 2023-09-10 DIAGNOSIS — F320 Major depressive disorder, single episode, mild: Secondary | ICD-10-CM | POA: Diagnosis not present

## 2023-09-10 DIAGNOSIS — M1990 Unspecified osteoarthritis, unspecified site: Secondary | ICD-10-CM | POA: Diagnosis not present

## 2023-09-10 DIAGNOSIS — I252 Old myocardial infarction: Secondary | ICD-10-CM | POA: Diagnosis not present

## 2023-09-10 DIAGNOSIS — E785 Hyperlipidemia, unspecified: Secondary | ICD-10-CM | POA: Diagnosis not present

## 2023-09-10 DIAGNOSIS — Z8249 Family history of ischemic heart disease and other diseases of the circulatory system: Secondary | ICD-10-CM | POA: Diagnosis not present

## 2023-09-10 DIAGNOSIS — N529 Male erectile dysfunction, unspecified: Secondary | ICD-10-CM | POA: Diagnosis not present

## 2023-09-10 DIAGNOSIS — Z809 Family history of malignant neoplasm, unspecified: Secondary | ICD-10-CM | POA: Diagnosis not present

## 2023-09-10 DIAGNOSIS — I25119 Atherosclerotic heart disease of native coronary artery with unspecified angina pectoris: Secondary | ICD-10-CM | POA: Diagnosis not present

## 2023-09-10 DIAGNOSIS — Z833 Family history of diabetes mellitus: Secondary | ICD-10-CM | POA: Diagnosis not present

## 2023-09-10 DIAGNOSIS — F419 Anxiety disorder, unspecified: Secondary | ICD-10-CM | POA: Diagnosis not present

## 2023-09-10 DIAGNOSIS — I129 Hypertensive chronic kidney disease with stage 1 through stage 4 chronic kidney disease, or unspecified chronic kidney disease: Secondary | ICD-10-CM | POA: Diagnosis not present

## 2023-09-25 ENCOUNTER — Encounter: Payer: Self-pay | Admitting: Physician Assistant

## 2023-09-25 ENCOUNTER — Ambulatory Visit: Admitting: Physician Assistant

## 2023-09-25 ENCOUNTER — Other Ambulatory Visit: Payer: Self-pay | Admitting: Urology

## 2023-09-25 VITALS — BP 146/93 | HR 97 | Temp 98.4°F | Resp 16 | Ht 74.0 in | Wt 319.6 lb

## 2023-09-25 DIAGNOSIS — G5793 Unspecified mononeuropathy of bilateral lower limbs: Secondary | ICD-10-CM

## 2023-09-25 DIAGNOSIS — Z0001 Encounter for general adult medical examination with abnormal findings: Secondary | ICD-10-CM | POA: Diagnosis not present

## 2023-09-25 DIAGNOSIS — I10 Essential (primary) hypertension: Secondary | ICD-10-CM

## 2023-09-25 DIAGNOSIS — N418 Other inflammatory diseases of prostate: Secondary | ICD-10-CM

## 2023-09-25 DIAGNOSIS — I6930 Unspecified sequelae of cerebral infarction: Secondary | ICD-10-CM | POA: Diagnosis not present

## 2023-09-25 DIAGNOSIS — E1165 Type 2 diabetes mellitus with hyperglycemia: Secondary | ICD-10-CM

## 2023-09-25 MED ORDER — TRULICITY 1.5 MG/0.5ML ~~LOC~~ SOAJ
1.5000 mg | SUBCUTANEOUS | 2 refills | Status: DC
Start: 2023-09-25 — End: 2023-11-27

## 2023-09-25 NOTE — Progress Notes (Signed)
 [facility] [address] [place], [place] [address]  Internal MEDICINE  Office Visit Note  Patient Name: Gerald [name]  [id number]  [id number]  Date of Service: 09/30/2023  Chief Complaint  Patient presents with   Annual Exam   Diabetes   Hypertension     HPI Pt is here for routine health maintenance examination -Maxed out PT last week and will be trying home program -following with neurology and states meds changing regularly -still weakness on left side, some numbness on right leg now as well and neurology aware -feels unstable, has cane, but no walker and will order -Taking 2tab of 25 mg seroquel -based on CGM, avg around 175, improved -avg 8-10units short acting insulin  with meals; 30units long acting -jardiance , and will increase trulicity  -looking for dentist and eye doctor now with his insurance -foot exam done -back to neurology next thursday  Current Medication: Outpatient Encounter Medications as of 09/25/2023  Medication Sig   aspirin  EC 81 MG tablet Take 1 tablet (81 mg total) by mouth daily. Swallow whole.   citalopram  (CELEXA ) 40 MG tablet TAKE 1 TABLET BY MOUTH EVERY DAY   clopidogrel  (PLAVIX ) 75 MG tablet Take 1 tablet (75 mg total) by mouth daily.   Continuous Glucose Sensor (DEXCOM G7 SENSOR) MISC Use every 10 days as directed. Dx: E11.65.   cyclobenzaprine  (FLEXERIL ) 10 MG tablet TAKE 1 TABLET (10 MG TOTAL) BY MOUTH AT BEDTIME FOR BACK SPASM AS NEEDED ONLY   Dulaglutide  (TRULICITY ) 1.5 MG/0.5ML SOAJ Inject 1.5 mg into the skin once a week.   empagliflozin  (JARDIANCE ) 25 MG TABS tablet Take 1 tablet (25 mg total) by mouth daily before breakfast.   Evolocumab  (REPATHA  SURECLICK) 140 MG/ML SOAJ Inject 140 mg into the skin every 14 (fourteen) days.   furosemide  (LASIX ) 20 MG tablet TAKE 1 TABLET BY MOUTH EVERY DAY AS NEEDED FOR LEG SWELLING   gabapentin  (NEURONTIN ) 300 MG capsule TAKE 1 CAPSULE BY MOUTH THREE TIMES A DAY   insulin   aspart (NOVOLOG  FLEXPEN) 100 UNIT/ML FlexPen Inject with meals per sliding scale. Max 14 units   Insulin  Pen Needle (BD PEN NEEDLE NANO U/F) 32G X 4 MM MISC Use as directed once a daily   lamoTRIgine  (LAMICTAL ) 25 MG tablet Take 1 tablet (25 mg total) by mouth at bedtime for 14 days, THEN 1 tablet (25 mg total) 2 (two) times daily for 14 days.   lisinopril -hydrochlorothiazide  (ZESTORETIC ) 20-25 MG tablet TAKE 1 TABLET BY MOUTH EVERY DAY   metoprolol  tartrate (LOPRESSOR ) 100 MG tablet TAKE 1 TABLET 2 HR PRIOR TO CARDIAC PROCEDURE   Multiple Vitamins-Minerals (MULTIVITAMIN ADULTS PO) Take 1 tablet by mouth daily.   nitroGLYCERIN  (NITROSTAT ) 0.4 MG SL tablet Place 1 tablet (0.4 mg total) under the tongue every 5 (five) minutes x 3 doses as needed for chest pain.   nortriptyline (PAMELOR) 10 MG capsule Take 10 mg by mouth at bedtime.   nystatin -triamcinolone  ointment (MYCOLOG) Apply 1 Application topically 2 (two) times daily.   rosuvastatin  (CRESTOR ) 5 MG tablet Take 1 tablet (5 mg total) by mouth at bedtime.   sulfamethoxazole -trimethoprim  (BACTRIM  DS) 800-160 MG tablet TAKE 1 TABLET BY MOUTH EVERY 12 HOURS   TRESIBA  FLEXTOUCH 100 UNIT/ML FlexTouch Pen INJECT 10 UNITS INTO THE SKIN DAILY   Vibegron  (GEMTESA ) 75 MG TABS Take 1 tablet (75 mg total) by mouth daily.   [DISCONTINUED] Dulaglutide  (TRULICITY ) 0.75 MG/0.5ML SOAJ Inject 0.75 mg into the skin once a week.   No facility-administered encounter  medications on file as of 09/25/2023.    Surgical History: Past Surgical History:  Procedure Laterality Date   BONE EXCISION Right 11/21/2020   Procedure: PART EXCISION BONE- PHALANX;  Surgeon: Ashley Soulier, DPM;  Location: [facility];  Service: Podiatry;  Laterality: Right;   CARDIAC CATHETERIZATION N/A 09/2011   [facility]; EF 50% with 30% mid LAD stenosis and no obstructive disease.   CARDIAC CATHETERIZATION  09/2010   [facility]; Mid LAD 40% stenosis; Mid Circumflex:Normal; Mid RCA; Normal    CARDIAC CATHETERIZATION     COLONOSCOPY     COLONOSCOPY WITH PROPOFOL  N/A 04/10/2021   Procedure: COLONOSCOPY WITH PROPOFOL ;  Surgeon: Therisa Bi, MD;  Location: [facility] ENDOSCOPY;  Service: Gastroenterology;  Laterality: N/A;   ESOPHAGOGASTRODUODENOSCOPY N/A 04/10/2021   Procedure: ESOPHAGOGASTRODUODENOSCOPY (EGD);  Surgeon: Therisa Bi, MD;  Location: [facility] ENDOSCOPY;  Service: Gastroenterology;  Laterality: N/A;   ESOPHAGOGASTRODUODENOSCOPY (EGD) WITH PROPOFOL  N/A 06/10/2017   Procedure: ESOPHAGOGASTRODUODENOSCOPY (EGD) WITH PROPOFOL ;  Surgeon: Therisa Bi, MD;  Location: [facility] ENDOSCOPY;  Service: Gastroenterology;  Laterality: N/A;   ESOPHAGOGASTRODUODENOSCOPY (EGD) WITH PROPOFOL  N/A 01/01/2022   Procedure: ESOPHAGOGASTRODUODENOSCOPY (EGD) WITH PROPOFOL ;  Surgeon: Therisa Bi, MD;  Location: [facility] ENDOSCOPY;  Service: Gastroenterology;  Laterality: N/A;   ESOPHAGOGASTRODUODENOSCOPY (EGD) WITH PROPOFOL  N/A 01/06/2022   Procedure: ESOPHAGOGASTRODUODENOSCOPY (EGD) WITH PROPOFOL ;  Surgeon: Therisa Bi, MD;  Location: [facility] ENDOSCOPY;  Service: Gastroenterology;  Laterality: N/A;   HAMMER TOE SURGERY Right 11/21/2020   Procedure: HAMMERTOE CORRECTION;  Surgeon: Ashley Soulier, DPM;  Location: [facility];  Service: Podiatry;  Laterality: Right;   INSERTION OF MESH  04/16/2021   Procedure: INSERTION OF MESH;  Surgeon: Desiderio Schanz, MD;  Location: [facility] ORS;  Service: General;;   KNEE ARTHROSCOPY WITH MEDIAL MENISECTOMY Right 09/16/2012   Procedure: RIGHT KNEE ARTHROSCOPY WITH MEDIAL AND LATERAL MENISECTOMY, CHONDROPLASTY;  Surgeon: Toribio JULIANNA Chancy, MD;  Location: [facility];  Service: Orthopedics;  Laterality: Right;  RIGHT KNEE SCOPE MEDIAL MENISCECTOMY   LEFT HEART CATH AND CORONARY ANGIOGRAPHY N/A 06/11/2016   Procedure: Left Heart Cath and Coronary Angiography;  Surgeon: Perla Evalene PARAS, MD;  Location: [facility] INVASIVE CV LAB;  Service: Cardiovascular;  Laterality: N/A;   UMBILICAL HERNIA  REPAIR N/A 04/16/2021   Procedure: HERNIA REPAIR UMBILICAL ADULT, open;  Surgeon: Desiderio Schanz, MD;  Location: [facility] ORS;  Service: General;  Laterality: N/A;    Medical History: Past Medical History:  Diagnosis Date   Abnormal LFTs    Anxiety    Atypical chest pain    Borderline diabetes    CAD (coronary artery disease)    a.) PCI and stent placement (unknown type) to mLAD and mLCx; date unknown. b.) LHC 09/10/2010: 40% mLAD; no intervention. c.) LHC 09/12/2011: EF 60%; 30% ISR mLAD, 40% dLAD, 30% pLCx, 30% ISR mLCx, 40% mRCA; no intervention. d.) LHC 05/23/2014: EF >55%; 30% p-mLAD; no intervention. e.) LHC 06/11/2016: EF 55-65%; LVEDP norm; 30-40% p-mLAD; no intervention.   Carcinoma (HCC)    CHF (congestive heart failure) (HCC)    Chronic pain syndrome    Depression    Diabetes mellitus without complication (HCC)    Fundic gland polyps of stomach, benign    Gastritis    GERD (gastroesophageal reflux disease)    Hepatic steatosis    Hyperlipidemia    Hypertension    Migraines    Myocardial infarction [facility]) 2008   was told by PCP   Nonischemic cardiomyopathy (HCC)    a.) TTE 09/12/2011: EF 35-45%. b.) LHC  09/12/2011: EF 60%. c.) TTE 06/04/2012: EF 55-60%. d.) LHC 05/23/2014: EF >55%. e.) TTE 06/11/2016: EF 55-60%; G1DD. f.) LHC 06/11/2016: EF 55-65%. g.) TTE 01/08/2018: EF 60-65%.   Obesity    Occlusion and stenosis of bilateral carotid arteries    OSA on CPAP    Osteoarthritis of both knees    Shortness of breath    Squamous cell carcinoma of skin 02/27/2020   left distal lat deltoid - [facility]   Stroke (HCC)    10/24, 3/25    Family History: Family History  Problem Relation Age of Onset   Heart disease Father 11       MI   Heart attack Father    Stomach cancer Father    Hypertension Mother    Breast cancer Mother       Review of Systems  Constitutional:  Positive for fatigue. Negative for chills and unexpected weight change.  HENT:  Negative for congestion,  postnasal drip, rhinorrhea, sneezing and sore throat.   Eyes:  Negative for redness.  Respiratory:  Negative for cough, chest tightness and shortness of breath.   Cardiovascular:  Negative for chest pain and palpitations.  Gastrointestinal:  Negative for anal bleeding, constipation, diarrhea, nausea and vomiting.  Genitourinary:  Negative for dysuria.  Musculoskeletal:  Positive for arthralgias, back pain, gait problem and myalgias. Negative for joint swelling and neck pain.  Skin:  Negative for rash.  Neurological:  Positive for weakness and numbness.  Hematological:  Negative for adenopathy. Does not bruise/bleed easily.  Psychiatric/Behavioral:  Positive for behavioral problems (Depression) and sleep disturbance. Negative for self-injury and suicidal ideas. The patient is nervous/anxious.      Vital Signs: BP 132/78 Comment: 146/93  Pulse 97   Temp 98.4 F (36.9 C)   Resp 16   Ht 6' 2 (1.88 m)   Wt (!) 319 lb 9.6 oz (145 kg)   SpO2 (!) 86%   BMI 41.03 kg/m    Physical Exam Vitals and nursing note reviewed.  Constitutional:      General: He is not in acute distress.    Appearance: Normal appearance. He is well-developed. He is obese. He is not diaphoretic.  HENT:     Head: Normocephalic and atraumatic.  Eyes:     Extraocular Movements: Extraocular movements intact.  Neck:     Thyroid : No thyromegaly.     Vascular: No JVD.     Trachea: No tracheal deviation.  Cardiovascular:     Rate and Rhythm: Normal rate and regular rhythm.     Pulses:          Dorsalis pedis pulses are 1+ on the right side and 1+ on the left side.     Heart sounds: Normal heart sounds. No murmur heard.    No friction rub. No gallop.  Pulmonary:     Effort: Pulmonary effort is normal. No respiratory distress.     Breath sounds: No wheezing or rales.  Chest:     Chest wall: No tenderness.  Abdominal:     Tenderness: There is no abdominal tenderness.  Musculoskeletal:     Cervical back: Normal  range of motion and neck supple.  Feet:     Right foot:     Protective Sensation: 3 sites tested.   1 site sensed.    Skin integrity: Dry skin present.     Left foot:     Protective Sensation: 3 sites tested.  0 sites sensed.     Skin integrity: Dry  skin present.  Lymphadenopathy:     Cervical: No cervical adenopathy.  Skin:    General: Skin is warm and dry.  Neurological:     Mental Status: He is alert and oriented to person, place, and time.     Cranial Nerves: No cranial nerve deficit.     Sensory: Sensory deficit present.     Gait: Gait abnormal.  Psychiatric:        Thought Content: Thought content normal.        Judgment: Judgment normal.      LABS: Recent Results (from the past 2160 hours)  Urinalysis, Complete w Microscopic -     Status: Abnormal   Collection Time: 07/27/23  2:39 PM  Result Value Ref Range   Color, Urine YELLOW YELLOW   APPearance CLEAR CLEAR   Specific Gravity, Urine 1.010 1.005 - 1.030   pH 5.5 5.0 - 8.0   Glucose, UA >=500 (A) NEGATIVE mg/dL   Hgb urine dipstick NEGATIVE NEGATIVE   Bilirubin Urine NEGATIVE NEGATIVE   Ketones, ur NEGATIVE NEGATIVE mg/dL   Protein, ur NEGATIVE NEGATIVE mg/dL   Nitrite NEGATIVE NEGATIVE   Leukocytes,Ua NEGATIVE NEGATIVE   Squamous Epithelial / HPF 0-5 0 - 5 /HPF   WBC, UA 0-5 0 - 5 WBC/hpf   RBC / HPF 0-5 0 - 5 RBC/hpf   Bacteria, UA RARE (A) NONE SEEN   Budding Yeast PRESENT     Comment: Performed at [facility], [address] [place], [place] [address]  Protime-INR     Status: None   Collection Time: 07/28/23  2:50 PM  Result Value Ref Range   Prothrombin Time 14.5 11.4 - 15.2 seconds   INR 1.1 0.8 - 1.2    Comment: (NOTE) INR goal varies based on device and disease states. Performed at [facility], [address] [place], [place] [address]   APTT     Status: None   Collection Time: 07/28/23  2:50 PM  Result Value Ref Range   aPTT 28 24 - 36 seconds    Comment:  Performed at [facility], [address] [place], [place] [address]  CBC     Status: Abnormal   Collection Time: 07/28/23  2:50 PM  Result Value Ref Range   WBC 12.4 (H) 4.0 - 10.5 K/uL   RBC 5.16 4.22 - 5.81 MIL/uL   Hemoglobin 15.3 13.0 - 17.0 g/dL   HCT 56.0 60.9 - 47.9 %   MCV 85.1 80.0 - 100.0 fL   MCH 29.7 26.0 - 34.0 pg   MCHC 34.9 30.0 - 36.0 g/dL   RDW 86.5 88.4 - 84.4 %   Platelets 259 150 - 400 K/uL   nRBC 0.0 0.0 - 0.2 %    Comment: Performed at [facility], [address] [place], [place] [address]  Differential     Status: Abnormal   Collection Time: 07/28/23  2:50 PM  Result Value Ref Range   Neutrophils Relative % 60 %   Neutro Abs 7.5 1.7 - 7.7 K/uL   Lymphocytes Relative 32 %   Lymphs Abs 3.9 0.7 - 4.0 K/uL   Monocytes Relative 6 %   Monocytes Absolute 0.7 0.1 - 1.0 K/uL   Eosinophils Relative 1 %   Eosinophils Absolute 0.1 0.0 - 0.5 K/uL   Basophils Relative 0 %   Basophils Absolute 0.0 0.0 - 0.1 K/uL   Immature Granulocytes 1 %   Abs Immature Granulocytes 0.12 (H) 0.00 -  0.07 K/uL    Comment: Performed at [facility], [address] [place], [place] [address]  Comprehensive metabolic panel     Status: Abnormal   Collection Time: 07/28/23  2:50 PM  Result Value Ref Range   Sodium 134 (L) 135 - 145 mmol/L   Potassium 3.5 3.5 - 5.1 mmol/L   Chloride 96 (L) 98 - 111 mmol/L   CO2 25 22 - 32 mmol/L   Glucose, Bld 222 (H) 70 - 99 mg/dL    Comment: Glucose reference range applies only to samples taken after fasting for at least 8 hours.   BUN 26 (H) 6 - 20 mg/dL   Creatinine, Ser 8.66 (H) 0.61 - 1.24 mg/dL   Calcium  8.9 8.9 - 10.3 mg/dL   Total Protein 7.2 6.5 - 8.1 g/dL   Albumin 4.0 3.5 - 5.0 g/dL   AST 37 15 - 41 U/L   ALT 48 (H) 0 - 44 U/L   Alkaline Phosphatase 85 38 - 126 U/L   Total Bilirubin 0.5 0.0 - 1.2 mg/dL   GFR, Estimated >39 >39 mL/min    Comment: (NOTE) Calculated using the CKD-EPI Creatinine Equation  (2021)    Anion gap 13 5 - 15    Comment: Performed at [facility], [address] [place], [place] [address]  Ethanol     Status: None   Collection Time: 07/28/23  2:50 PM  Result Value Ref Range   Alcohol, Ethyl (B) <15 <15 mg/dL    Comment: (NOTE) For medical purposes only. Performed at [facility], [address], [place], [place] [address]   Urine Microalbumin w/creat. ratio     Status: None   Collection Time: 08/03/23  2:20 PM  Result Value Ref Range   Creatinine, Urine 51.5 Not Estab. mg/dL   Microalbumin, Urine <6.9 Not Estab. ug/mL   Microalb/Creat Ratio <6 0 - 29 mg/g creat    Comment:                        Normal:                0 -  29                        Moderately increased: 30 - 300                        Severely increased:       >300   POCT HgB A1C     Status: Abnormal   Collection Time: 08/03/23  2:20 PM  Result Value Ref Range   Hemoglobin A1C 9.2 (A) 4.0 - 5.6 %   HbA1c POC (<> result, manual entry)     HbA1c, POC (prediabetic range)     HbA1c, POC (controlled diabetic range)    POCT Urinalysis Dipstick     Status: Abnormal   Collection Time: 08/03/23  3:57 PM  Result Value Ref Range   Color, UA     Clarity, UA     Glucose, UA Positive (A) Negative   Bilirubin, UA Negative    Ketones, UA Positive    Spec Grav, UA 1.010 1.010 - 1.025   Blood, UA Negative    pH, UA 5.0 5.0 - 8.0   Protein, UA Negative Negative   Urobilinogen, UA 0.2 0.2 or 1.0 E.U./dL   Nitrite, UA Negative    Leukocytes, UA Negative  Negative   Appearance     Odor    CULTURE, URINE COMPREHENSIVE     Status: Abnormal   Collection Time: 08/03/23  4:01 PM   Specimen: Urine   Urine  Result Value Ref Range   Urine Culture, Comprehensive Final report (A)    Organism ID, Bacteria Enterococcus faecalis (A)     Comment: For Enterococcus species, aminoglycosides (except for high-level resistance screening), cephalosporins, clindamycin,  and trimethoprim -sulfamethoxazole  are not effective clinically. (CLSI, M100-S26, 2016) 2,000 Colonies/mL    ANTIMICROBIAL SUSCEPTIBILITY Comment     Comment:       ** S = Susceptible; I = Intermediate; R = Resistant **                    P = Positive; N = Negative             MICS are expressed in micrograms per mL    Antibiotic                 RSLT#1    RSLT#2    RSLT#3    RSLT#4 Ciprofloxacin                  S Levofloxacin                    S Nitrofurantoin                 S Penicillin                     S Tetracycline                   R Vancomycin                     S         Assessment/Plan: 1. Encounter for general adult medical examination with abnormal findings (Primary) CPE performed  2. Type 2 diabetes mellitus with hyperglycemia, unspecified whether long term insulin  use (HCC) Will increase trulicity , continue other medications as before and continue to monitor - Dulaglutide  (TRULICITY ) 1.5 MG/0.5ML SOAJ; Inject 1.5 mg into the skin once a week.  Dispense: 2 mL; Refill: 2  3. History of stroke with current residual effects Followed by neurology, will order walker for patient to help with balance and safety - Walker rolling  4. Essential hypertension Well controlled, continue current medication  5. Neuropathy involving both lower extremities Followed by neurology   General Counseling: Quintin oakland understanding of the findings of todays visit and agrees with plan of treatment. I have discussed any further diagnostic evaluation that may be needed or ordered today. We also reviewed his medications today. he has been encouraged to call the office with any questions or concerns that should arise related to todays visit.    Counseling:    Orders Placed This Encounter  Procedures   Walker rolling    Meds ordered this encounter  Medications   Dulaglutide  (TRULICITY ) 1.5 MG/0.5ML SOAJ    Sig: Inject 1.5 mg into the skin once a week.    Dispense:   2 mL    Refill:  2    This patient was seen by Tinnie Pro, PA-C in collaboration with Dr. Sigrid Bathe as a part of collaborative care agreement.  Total time spent:35 Minutes  Time spent includes review of chart, medications, test results, and follow up plan with the patient.     Sigrid CHRISTELLA Bathe, MD  Internal Medicine

## 2023-09-28 ENCOUNTER — Telehealth: Payer: Self-pay

## 2023-09-28 NOTE — Telephone Encounter (Signed)
 Sent to [employer] for walker

## 2023-09-30 ENCOUNTER — Telehealth: Payer: Self-pay

## 2023-09-30 DIAGNOSIS — G4733 Obstructive sleep apnea (adult) (pediatric): Secondary | ICD-10-CM | POA: Diagnosis not present

## 2023-09-30 DIAGNOSIS — I639 Cerebral infarction, unspecified: Secondary | ICD-10-CM | POA: Diagnosis not present

## 2023-09-30 DIAGNOSIS — G894 Chronic pain syndrome: Secondary | ICD-10-CM | POA: Diagnosis not present

## 2023-09-30 DIAGNOSIS — I10 Essential (primary) hypertension: Secondary | ICD-10-CM | POA: Diagnosis not present

## 2023-09-30 DIAGNOSIS — R32 Unspecified urinary incontinence: Secondary | ICD-10-CM | POA: Diagnosis not present

## 2023-09-30 DIAGNOSIS — R531 Weakness: Secondary | ICD-10-CM | POA: Diagnosis not present

## 2023-09-30 NOTE — Telephone Encounter (Signed)
 [employer] sent message that they left message to pt  and also advised pt that please call [employer] for setup

## 2023-10-01 ENCOUNTER — Telehealth: Payer: Self-pay | Admitting: Physician Assistant

## 2023-10-01 DIAGNOSIS — R52 Pain, unspecified: Secondary | ICD-10-CM | POA: Diagnosis not present

## 2023-10-01 DIAGNOSIS — G479 Sleep disorder, unspecified: Secondary | ICD-10-CM | POA: Diagnosis not present

## 2023-10-01 DIAGNOSIS — R269 Unspecified abnormalities of gait and mobility: Secondary | ICD-10-CM | POA: Diagnosis not present

## 2023-10-01 DIAGNOSIS — R531 Weakness: Secondary | ICD-10-CM | POA: Diagnosis not present

## 2023-10-01 DIAGNOSIS — R200 Anesthesia of skin: Secondary | ICD-10-CM | POA: Diagnosis not present

## 2023-10-01 DIAGNOSIS — I6930 Unspecified sequelae of cerebral infarction: Secondary | ICD-10-CM | POA: Diagnosis not present

## 2023-10-01 DIAGNOSIS — M5481 Occipital neuralgia: Secondary | ICD-10-CM | POA: Diagnosis not present

## 2023-10-01 DIAGNOSIS — I69398 Other sequelae of cerebral infarction: Secondary | ICD-10-CM | POA: Diagnosis not present

## 2023-10-01 DIAGNOSIS — F4321 Adjustment disorder with depressed mood: Secondary | ICD-10-CM | POA: Diagnosis not present

## 2023-10-01 DIAGNOSIS — R519 Headache, unspecified: Secondary | ICD-10-CM | POA: Diagnosis not present

## 2023-10-01 DIAGNOSIS — R2689 Other abnormalities of gait and mobility: Secondary | ICD-10-CM | POA: Diagnosis not present

## 2023-10-01 NOTE — Telephone Encounter (Signed)
 Patient called requesting additional PT visits. Patient advised to call his neurologist-Toni

## 2023-10-04 ENCOUNTER — Other Ambulatory Visit: Payer: Self-pay | Admitting: Physician Assistant

## 2023-10-06 ENCOUNTER — Other Ambulatory Visit: Payer: Self-pay | Admitting: Physician Assistant

## 2023-10-06 ENCOUNTER — Other Ambulatory Visit: Payer: Self-pay

## 2023-10-06 DIAGNOSIS — I10 Essential (primary) hypertension: Secondary | ICD-10-CM

## 2023-10-07 NOTE — Progress Notes (Signed)
 10/08/2023 3:12 PM   Gerald [name] 17-Jan-1966 [id number]  Referring provider: Kristina Tinnie POUR, PA-C [address] [place],  [place] [address]  Urological history: 1. ED - Sildenafil  50 mg, on-demand dosing  2. BPH with LU TS - PSA (2024) 1.4  - cysto (09/2023) moderate BPH   3. Hypogonadism - discontinued TRT secondary to stroke  4. Elevated PSA  - PSA (2023) 5.7   Chief Complaint  Patient presents with   Follow-up   HPI: Gerald [name] is a 58 y.o. man who presents today for follow up UA/PVR.   Previous records reviewed.   He was having urinary urgency, urge incontinence, postvoid dribbling urinary frequency and spraying of the urinary stream since April.  He failed Gemtesa , he is placed on Septra  DS twice daily for prostatitis as found his prostate was tender on DRE and he also underwent cystoscopy for further evaluation which did not find any stricture disease.  CT of the pelvis was unremarkable.  UA with 3+ glucose and greater than 10 epithelial cells and few bacteria  PVR 0 mL   He is no longer having any pain, but he continues to have issues with urination.  He states his urinary stream just dribbles out and less he strains.  When he strains he does have a good strong stream.  He is also experiencing postvoid dribbling.  He finds it very embarrassing.  Patient denies any modifying or aggravating factors.  Patient denies any recent UTI's, gross hematuria, dysuria or suprapubic/flank pain.  Patient denies any fevers, chills, nausea or vomiting.    PMH: Past Medical History:  Diagnosis Date   Abnormal LFTs    Anxiety    Atypical chest pain    Borderline diabetes    CAD (coronary artery disease)    a.) PCI and stent placement (unknown type) to mLAD and mLCx; date unknown. b.) LHC 09/10/2010: 40% mLAD; no intervention. c.) LHC 09/12/2011: EF 60%; 30% ISR mLAD, 40% dLAD, 30% pLCx, 30% ISR mLCx, 40% mRCA; no intervention. d.) LHC 05/23/2014: EF  >55%; 30% p-mLAD; no intervention. e.) LHC 06/11/2016: EF 55-65%; LVEDP norm; 30-40% p-mLAD; no intervention.   Carcinoma (HCC)    CHF (congestive heart failure) (HCC)    Chronic pain syndrome    Depression    Diabetes mellitus without complication (HCC)    Fundic gland polyps of stomach, benign    Gastritis    GERD (gastroesophageal reflux disease)    Hepatic steatosis    Hyperlipidemia    Hypertension    Migraines    Myocardial infarction [facility]) 2008   was told by PCP   Nonischemic cardiomyopathy (HCC)    a.) TTE 09/12/2011: EF 35-45%. b.) LHC 09/12/2011: EF 60%. c.) TTE 06/04/2012: EF 55-60%. d.) LHC 05/23/2014: EF >55%. e.) TTE 06/11/2016: EF 55-60%; G1DD. f.) LHC 06/11/2016: EF 55-65%. g.) TTE 01/08/2018: EF 60-65%.   Obesity    Occlusion and stenosis of bilateral carotid arteries    OSA on CPAP    Osteoarthritis of both knees    Shortness of breath    Squamous cell carcinoma of skin 02/27/2020   left distal lat deltoid - ED[facility]   Stroke (HCC)    10/24, 3/25    Surgical History: Past Surgical History:  Procedure Laterality Date   BONE EXCISION Right 11/21/2020   Procedure: PART EXCISION BONE- PHALANX;  Surgeon: Gerald [name], DPM;  Location: [facility];  Service: Podiatry;  Laterality: Right;   CARDIAC CATHETERIZATION N/A 09/2011  [facility]; EF 50% with 30% mid LAD stenosis and no obstructive disease.   CARDIAC CATHETERIZATION  09/2010   [facility]; Mid LAD 40% stenosis; Mid Circumflex:Normal; Mid RCA; Normal   CARDIAC CATHETERIZATION     COLONOSCOPY     COLONOSCOPY WITH PROPOFOL  N/A 04/10/2021   Procedure: COLONOSCOPY WITH PROPOFOL ;  Surgeon: Gerald Bi, MD;  Location: [facility] ENDOSCOPY;  Service: Gastroenterology;  Laterality: N/A;   ESOPHAGOGASTRODUODENOSCOPY N/A 04/10/2021   Procedure: ESOPHAGOGASTRODUODENOSCOPY (EGD);  Surgeon: Gerald Bi, MD;  Location: [facility] ENDOSCOPY;  Service: Gastroenterology;  Laterality: N/A;   ESOPHAGOGASTRODUODENOSCOPY (EGD) WITH PROPOFOL  N/A  06/10/2017   Procedure: ESOPHAGOGASTRODUODENOSCOPY (EGD) WITH PROPOFOL ;  Surgeon: Gerald Bi, MD;  Location: [facility] ENDOSCOPY;  Service: Gastroenterology;  Laterality: N/A;   ESOPHAGOGASTRODUODENOSCOPY (EGD) WITH PROPOFOL  N/A 01/01/2022   Procedure: ESOPHAGOGASTRODUODENOSCOPY (EGD) WITH PROPOFOL ;  Surgeon: Gerald Bi, MD;  Location: [facility] ENDOSCOPY;  Service: Gastroenterology;  Laterality: N/A;   ESOPHAGOGASTRODUODENOSCOPY (EGD) WITH PROPOFOL  N/A 01/06/2022   Procedure: ESOPHAGOGASTRODUODENOSCOPY (EGD) WITH PROPOFOL ;  Surgeon: Gerald Bi, MD;  Location: [facility] ENDOSCOPY;  Service: Gastroenterology;  Laterality: N/A;   HAMMER TOE SURGERY Right 11/21/2020   Procedure: HAMMERTOE CORRECTION;  Surgeon: Gerald [name], DPM;  Location: [facility];  Service: Podiatry;  Laterality: Right;   INSERTION OF MESH  04/16/2021   Procedure: INSERTION OF MESH;  Surgeon: Gerald Schanz, MD;  Location: [facility] ORS;  Service: General;;   KNEE ARTHROSCOPY WITH MEDIAL MENISECTOMY Right 09/16/2012   Procedure: RIGHT KNEE ARTHROSCOPY WITH MEDIAL AND LATERAL MENISECTOMY, CHONDROPLASTY;  Surgeon: Gerald JULIANNA Chancy, MD;  Location: [facility];  Service: Orthopedics;  Laterality: Right;  RIGHT KNEE SCOPE MEDIAL MENISCECTOMY   LEFT HEART CATH AND CORONARY ANGIOGRAPHY N/A 06/11/2016   Procedure: Left Heart Cath and Coronary Angiography;  Surgeon: Gerald Evalene PARAS, MD;  Location: [facility] INVASIVE CV LAB;  Service: Cardiovascular;  Laterality: N/A;   UMBILICAL HERNIA REPAIR N/A 04/16/2021   Procedure: HERNIA REPAIR UMBILICAL ADULT, open;  Surgeon: Gerald Schanz, MD;  Location: [facility] ORS;  Service: General;  Laterality: N/A;    Home Medications:  Allergies as of 10/08/2023       Reactions   Nexlizet  [bempedoic Acid-ezetimibe ] Rash   Statins Hives, Rash   For high dose statin        Medication List        Accurate as of October 08, 2023  3:12 PM. If you have any questions, ask your nurse or doctor.           aspirin  EC 81 MG tablet Take 1 tablet (81 mg total) by mouth daily. Swallow whole.   BD Pen Needle Nano U/F 32G X 4 MM Misc Generic drug: Insulin  Pen Needle Use as directed once a daily   citalopram  40 MG tablet Commonly known as: CELEXA  TAKE 1 TABLET BY MOUTH EVERY DAY   clopidogrel  75 MG tablet Commonly known as: PLAVIX  Take 1 tablet (75 mg total) by mouth daily.   cyclobenzaprine  10 MG tablet Commonly known as: FLEXERIL  TAKE 1 TABLET (10 MG TOTAL) BY MOUTH AT BEDTIME FOR BACK SPASM AS NEEDED ONLY   Dexcom G7 Sensor Misc Use every 10 days as directed. Dx: E11.65.   empagliflozin  25 MG Tabs tablet Commonly known as: Jardiance  Take 1 tablet (25 mg total) by mouth daily before breakfast.   furosemide  20 MG tablet Commonly known as: LASIX  TAKE 1 TABLET BY MOUTH EVERY DAY AS NEEDED FOR LEG SWELLING   gabapentin  300 MG capsule Commonly known as: NEURONTIN  TAKE 1  CAPSULE BY MOUTH THREE TIMES A DAY   Gemtesa  75 MG Tabs Generic drug: Vibegron  Take 1 tablet (75 mg total) by mouth daily.   lamoTRIgine  25 MG tablet Commonly known as: LAMICTAL  Take 1 tablet (25 mg total) by mouth at bedtime for 14 days, THEN 1 tablet (25 mg total) 2 (two) times daily for 14 days. Start taking on: April 18, 2023   lisinopril -hydrochlorothiazide  20-25 MG tablet Commonly known as: ZESTORETIC  TAKE 1 TABLET BY MOUTH EVERY DAY   metoprolol  tartrate 100 MG tablet Commonly known as: LOPRESSOR  TAKE 1 TABLET 2 HR PRIOR TO CARDIAC PROCEDURE   MULTIVITAMIN ADULTS PO Take 1 tablet by mouth daily.   nitroGLYCERIN  0.4 MG SL tablet Commonly known as: NITROSTAT  Place 1 tablet (0.4 mg total) under the tongue every 5 (five) minutes x 3 doses as needed for chest pain.   nortriptyline 10 MG capsule Commonly known as: PAMELOR Take 10 mg by mouth at bedtime.   NovoLOG  FlexPen 100 UNIT/ML FlexPen Generic drug: insulin  aspart Inject with meals per sliding scale. Max 14 units   nystatin -triamcinolone   ointment Commonly known as: MYCOLOG Apply 1 Application topically 2 (two) times daily.   Repatha  SureClick 140 MG/ML Soaj Generic drug: Evolocumab  Inject 140 mg into the skin every 14 (fourteen) days.   rosuvastatin  5 MG tablet Commonly known as: CRESTOR  Take 1 tablet (5 mg total) by mouth at bedtime.   sulfamethoxazole -trimethoprim  800-160 MG tablet Commonly known as: BACTRIM  DS TAKE 1 TABLET BY MOUTH EVERY 12 HOURS   Tresiba  FlexTouch 100 UNIT/ML FlexTouch Pen Generic drug: insulin  degludec INJECT 10 UNITS INTO THE SKIN DAILY   Trulicity  1.5 MG/0.5ML Soaj Generic drug: Dulaglutide  Inject 1.5 mg into the skin once a week.        Allergies:  Allergies  Allergen Reactions   Nexlizet  [Bempedoic Acid-Ezetimibe ] Rash   Statins Hives and Rash    For high dose statin    Family History: Family History  Problem Relation Age of Onset   Heart disease Father 68       MI   Heart attack Father    Stomach cancer Father    Hypertension Mother    Breast cancer Mother     Social History:  reports that he has never smoked. He has been exposed to tobacco smoke. He quit smokeless tobacco use about 38 years ago.  His smokeless tobacco use included chew. He reports that he does not currently use alcohol. He reports that he does not use drugs.  ROS: Pertinent ROS in HPI  Physical Exam: BP 124/81   Pulse 92   Ht 6' 2 (1.88 m)   Wt (!) 325 lb (147.4 kg)   BMI 41.73 kg/m   Constitutional:  Well nourished. Alert and oriented, No acute distress. HEENT: [place] AT, moist mucus membranes.  Trachea midline Cardiovascular: No clubbing, cyanosis, or edema. Respiratory: Normal respiratory effort, no increased work of breathing. Neurologic: Grossly intact, no focal deficits, moving all 4 extremities. Psychiatric: Normal mood and affect.   Laboratory Data: See HPI and EPIC I have reviewed the labs.  See HPI.     Pertinent Imaging: Results for orders placed or performed in visit on  10/08/23  BLADDER SCAN AMB NON-IMAGING   Collection Time: 10/08/23  2:34 PM  Result Value Ref Range   Scan Result 0ml      Assessment & Plan:    1. Balanoposthitis -  Encouraged better diabetes control - Continue Mycolog cream twice daily   2.  Prostatitis - resolved  3.  Urinary dribbling - Explained that some of the factors contributing to this are as BPH, history of stroke and diabetes and it is difficult to know at this time whether or not his symptoms are due to the prostate causing blockage with the urethra or if the bladder itself is starting to fail due to the strokes and diabetes.  I explained that sometimes, we can use urodynamics to help evaluate for this and I explained how urodynamics is performed.  I also offered PTNS therapy as it may reboot the bladder, but I explained this would be a trial and I could not guarantee the outcome.  Will also need to engage in Kegel exercises.  He would like information on this so that he can research it and discuss it with his wife and he will get back to me on whether or not he wants to have urodynamics or if he wants us  to check with his insurance to see if it will cover PTNS  Return for patient will call for follow up .  These notes generated with voice recognition software. I apologize for typographical errors.  CLOTILDA HELON RIGGERS  [facility] [address]  [address] [place], [place] [address] [phone]

## 2023-10-08 ENCOUNTER — Ambulatory Visit (INDEPENDENT_AMBULATORY_CARE_PROVIDER_SITE_OTHER): Admitting: Urology

## 2023-10-08 ENCOUNTER — Encounter: Payer: Self-pay | Admitting: Urology

## 2023-10-08 VITALS — BP 124/81 | HR 92 | Ht 74.0 in | Wt 325.0 lb

## 2023-10-08 DIAGNOSIS — N476 Balanoposthitis: Secondary | ICD-10-CM | POA: Diagnosis not present

## 2023-10-08 DIAGNOSIS — N418 Other inflammatory diseases of prostate: Secondary | ICD-10-CM | POA: Diagnosis not present

## 2023-10-08 DIAGNOSIS — N3943 Post-void dribbling: Secondary | ICD-10-CM | POA: Diagnosis not present

## 2023-10-08 LAB — BLADDER SCAN AMB NON-IMAGING

## 2023-10-08 LAB — URINALYSIS, COMPLETE
Bilirubin, UA: NEGATIVE
Ketones, UA: NEGATIVE
Leukocytes,UA: NEGATIVE
Nitrite, UA: NEGATIVE
Protein,UA: NEGATIVE
RBC, UA: NEGATIVE
Specific Gravity, UA: 1.02 (ref 1.005–1.030)
Urobilinogen, Ur: 1 mg/dL (ref 0.2–1.0)
pH, UA: 6 (ref 5.0–7.5)

## 2023-10-08 LAB — MICROSCOPIC EXAMINATION: Epithelial Cells (non renal): >10 /HPF — AB (ref 0–10)

## 2023-10-08 NOTE — Patient Instructions (Signed)

## 2023-10-12 DIAGNOSIS — M25431 Effusion, right wrist: Secondary | ICD-10-CM | POA: Diagnosis not present

## 2023-10-12 DIAGNOSIS — M778 Other enthesopathies, not elsewhere classified: Secondary | ICD-10-CM | POA: Diagnosis not present

## 2023-10-15 DIAGNOSIS — E119 Type 2 diabetes mellitus without complications: Secondary | ICD-10-CM | POA: Diagnosis not present

## 2023-10-15 DIAGNOSIS — H524 Presbyopia: Secondary | ICD-10-CM | POA: Diagnosis not present

## 2023-10-15 DIAGNOSIS — H52223 Regular astigmatism, bilateral: Secondary | ICD-10-CM | POA: Diagnosis not present

## 2023-10-29 ENCOUNTER — Telehealth: Payer: Self-pay | Admitting: Physician Assistant

## 2023-10-29 NOTE — Telephone Encounter (Signed)
 Received vm from [facility] w/ [employer] requesting office notes from 05/05/23 to present. Left her vm needing to know reason for office notes and what they are supplying patient with. [phone] ext [phone]-Toni

## 2023-11-02 ENCOUNTER — Other Ambulatory Visit: Payer: Self-pay

## 2023-11-02 ENCOUNTER — Emergency Department

## 2023-11-02 ENCOUNTER — Emergency Department
Admission: EM | Admit: 2023-11-02 | Discharge: 2023-11-02 | Disposition: A | Discharge status: Home / Self Care | Attending: Emergency Medicine | Admitting: Emergency Medicine

## 2023-11-02 DIAGNOSIS — E119 Type 2 diabetes mellitus without complications: Secondary | ICD-10-CM | POA: Diagnosis not present

## 2023-11-02 DIAGNOSIS — Z955 Presence of coronary angioplasty implant and graft: Secondary | ICD-10-CM | POA: Diagnosis not present

## 2023-11-02 DIAGNOSIS — K760 Fatty (change of) liver, not elsewhere classified: Secondary | ICD-10-CM | POA: Diagnosis not present

## 2023-11-02 DIAGNOSIS — E1165 Type 2 diabetes mellitus with hyperglycemia: Secondary | ICD-10-CM | POA: Diagnosis not present

## 2023-11-02 DIAGNOSIS — I2510 Atherosclerotic heart disease of native coronary artery without angina pectoris: Secondary | ICD-10-CM | POA: Diagnosis not present

## 2023-11-02 DIAGNOSIS — Z85828 Personal history of other malignant neoplasm of skin: Secondary | ICD-10-CM | POA: Diagnosis not present

## 2023-11-02 DIAGNOSIS — I509 Heart failure, unspecified: Secondary | ICD-10-CM | POA: Diagnosis not present

## 2023-11-02 DIAGNOSIS — I110 Hypertensive heart disease with heart failure: Secondary | ICD-10-CM | POA: Insufficient documentation

## 2023-11-02 DIAGNOSIS — B372 Candidiasis of skin and nail: Secondary | ICD-10-CM | POA: Diagnosis not present

## 2023-11-02 DIAGNOSIS — R1030 Lower abdominal pain, unspecified: Secondary | ICD-10-CM | POA: Diagnosis not present

## 2023-11-02 LAB — URINALYSIS, ROUTINE W REFLEX MICROSCOPIC
Bacteria, UA: NONE SEEN
Bilirubin Urine: NEGATIVE
Glucose, UA: >=500 mg/dL — AB
Hgb urine dipstick: NEGATIVE
Ketones, ur: NEGATIVE mg/dL
Leukocytes,Ua: NEGATIVE
Nitrite: NEGATIVE
Protein, ur: NEGATIVE mg/dL
Specific Gravity, Urine: 1.03 (ref 1.005–1.030)
pH: 5 (ref 5.0–8.0)

## 2023-11-02 LAB — COMPREHENSIVE METABOLIC PANEL WITH GFR
ALT: 41 U/L (ref 0–44)
AST: 32 U/L (ref 15–41)
Albumin: 4.1 g/dL (ref 3.5–5.0)
Alkaline Phosphatase: 83 U/L (ref 38–126)
Anion gap: 11 (ref 5–15)
BUN: 20 mg/dL (ref 6–20)
CO2: 25 mmol/L (ref 22–32)
Calcium: 9 mg/dL (ref 8.9–10.3)
Chloride: 98 mmol/L (ref 98–111)
Creatinine, Ser: 0.97 mg/dL (ref 0.61–1.24)
GFR, Estimated: >60 mL/min (ref >60)
Glucose, Bld: 187 mg/dL — ABNORMAL HIGH (ref 70–99)
Potassium: 3.4 mmol/L — ABNORMAL LOW (ref 3.5–5.1)
Sodium: 134 mmol/L — ABNORMAL LOW (ref 135–145)
Total Bilirubin: 0.8 mg/dL (ref 0.0–1.2)
Total Protein: 7.9 g/dL (ref 6.5–8.1)

## 2023-11-02 LAB — CBC WITH DIFFERENTIAL/PLATELET
Abs Immature Granulocytes: 0.23 K/uL — ABNORMAL HIGH (ref 0.00–0.07)
Basophils Absolute: 0.1 K/uL (ref 0.0–0.1)
Basophils Relative: 1 %
Eosinophils Absolute: 0.1 K/uL (ref 0.0–0.5)
Eosinophils Relative: 1 %
HCT: 44.3 % (ref 39.0–52.0)
Hemoglobin: 15.3 g/dL (ref 13.0–17.0)
Immature Granulocytes: 2 %
Lymphocytes Relative: 38 %
Lymphs Abs: 4.1 K/uL — ABNORMAL HIGH (ref 0.7–4.0)
MCH: 29.3 pg (ref 26.0–34.0)
MCHC: 34.5 g/dL (ref 30.0–36.0)
MCV: 84.7 fL (ref 80.0–100.0)
Monocytes Absolute: 0.8 K/uL (ref 0.1–1.0)
Monocytes Relative: 7 %
Neutro Abs: 5.6 K/uL (ref 1.7–7.7)
Neutrophils Relative %: 51 %
Platelets: 264 K/uL (ref 150–400)
RBC: 5.23 MIL/uL (ref 4.22–5.81)
RDW: 14.6 % (ref 11.5–15.5)
WBC: 10.9 K/uL — ABNORMAL HIGH (ref 4.0–10.5)
nRBC: 0 % (ref 0.0–0.2)

## 2023-11-02 MED ORDER — NYSTATIN 100000 UNIT/GM EX POWD: 1.0000 | Freq: Three times a day (TID) | CUTANEOUS | 1 refills | Status: AC

## 2023-11-02 NOTE — ED Triage Notes (Signed)
 Pt reports right side groin pain and lower abd pain on right side as well since yesterday, pt reports pain is worse with movement, Pt denies dysuria or hematuria.

## 2023-11-02 NOTE — ED Provider Notes (Signed)
 [facility] Provider Note    Event Date/Time   First MD Initiated Contact with Patient 11/02/23 [phone]     (approximate)   History   Chief Complaint: Groin Pain   HPI  Gerald [name] is a 57 y.o. male with a history of CAD, CHF, diabetes who comes ED complaining of right groin pain since yesterday, worse with movement.  No fever, no vomiting diarrhea.        Past Medical History:  Diagnosis Date   Abnormal LFTs    Anxiety    Atypical chest pain    Borderline diabetes    CAD (coronary artery disease)    a.) PCI and stent placement (unknown type) to mLAD and mLCx; date unknown. b.) LHC 09/10/2010: 40% mLAD; no intervention. c.) LHC 09/12/2011: EF 60%; 30% ISR mLAD, 40% dLAD, 30% pLCx, 30% ISR mLCx, 40% mRCA; no intervention. d.) LHC 05/23/2014: EF >55%; 30% p-mLAD; no intervention. e.) LHC 06/11/2016: EF 55-65%; LVEDP norm; 30-40% p-mLAD; no intervention.   Carcinoma (HCC)    CHF (congestive heart failure) (HCC)    Chronic pain syndrome    Depression    Diabetes mellitus without complication (HCC)    Fundic gland polyps of stomach, benign    Gastritis    GERD (gastroesophageal reflux disease)    Hepatic steatosis    Hyperlipidemia    Hypertension    Migraines    Myocardial infarction [facility]) 2008   was told by PCP   Nonischemic cardiomyopathy (HCC)    a.) TTE 09/12/2011: EF 35-45%. b.) LHC 09/12/2011: EF 60%. c.) TTE 06/04/2012: EF 55-60%. d.) LHC 05/23/2014: EF >55%. e.) TTE 06/11/2016: EF 55-60%; G1DD. f.) LHC 06/11/2016: EF 55-65%. g.) TTE 01/08/2018: EF 60-65%.   Obesity    Occlusion and stenosis of bilateral carotid arteries    OSA on CPAP    Osteoarthritis of both knees    Shortness of breath    Squamous cell carcinoma of skin 02/27/2020   left distal lat deltoid - [facility]   Stroke (HCC)    10/24, 3/25    Current Outpatient Rx   Order #: [id number] Class: Normal   Order #: [id number] Class: Normal   Order #: [id number] Class: Normal    Order #: [id number] Class: Normal   Order #: [id number] Class: Normal   Order #: [id number] Class: Normal   Order #: [id number] Class: Normal   Order #: [id number] Class: Normal   Order #: [id number] Class: Normal   Order #: [id number] Class: Normal   Order #: [id number] Class: Normal   Order #: [id number] Class: Normal   Order #: [id number] Class: Normal   Order #: [id number] Class: Normal   Order #: [id number] Class: Normal   Order #: [id number] Class: Normal   Order #: [id number] Class: Historical Med   Order #: [id number] Class: Normal   Order #: [id number] Class: Historical Med   Order #: [id number] Class: Normal   Order #: [id number] Class: Normal   Order #: [id number] Class: Normal   Order #: [id number] Class: Normal   Order #: [id number] Class: Normal    Past Surgical History:  Procedure Laterality Date   BONE EXCISION Right 11/21/2020   Procedure: PART EXCISION BONE- PHALANX;  Surgeon: Ashley Soulier, DPM;  Location: [facility];  Service: Podiatry;  Laterality: Right;   CARDIAC CATHETERIZATION N/A 09/2011   [facility]; EF 50% with 30% mid LAD stenosis and no obstructive disease.   CARDIAC CATHETERIZATION  09/2010   [facility]; Mid LAD 40% stenosis; Mid Circumflex:Normal; Mid RCA; Normal   CARDIAC CATHETERIZATION     COLONOSCOPY  COLONOSCOPY WITH PROPOFOL  N/A 04/10/2021   Procedure: COLONOSCOPY WITH PROPOFOL ;  Surgeon: Therisa Bi, MD;  Location: [facility] ENDOSCOPY;  Service: Gastroenterology;  Laterality: N/A;   ESOPHAGOGASTRODUODENOSCOPY N/A 04/10/2021   Procedure: ESOPHAGOGASTRODUODENOSCOPY (EGD);  Surgeon: Therisa Bi, MD;  Location: [facility] END[facility]COPY;  Service: Gastroenterology;  Laterality: N/A;   ESOPHAGOGASTRODUODENOSCOPY (EGD) WITH PROPOFOL  N/A 06/10/2017   Procedure: ESOPHAGOGASTRODUODENOSCOPY (EGD) WITH PROPOFOL ;  Surgeon: Therisa Bi, MD;  Location: [facility] ENDOSCOPY;  Service: Gastroenterology;  Laterality: N/A;   ESOPHAGOGASTRODUODENOSCOPY (EGD) WITH PROPOFOL  N/A 01/01/2022   Procedure:  ESOPHAGOGASTRODUODENOSCOPY (EGD) WITH PROPOFOL ;  Surgeon: Therisa Bi, MD;  Location: [facility] ENDOSCOPY;  Service: Gastroenterology;  Laterality: N/A;   ESOPHAGOGASTRODUODENOSCOPY (EGD) WITH PROPOFOL  N/A 01/06/2022   Procedure: ESOPHAGOGASTRODUODENOSCOPY (EGD) WITH PROPOFOL ;  Surgeon: Therisa Bi, MD;  Location: [facility] ENDOSCOPY;  Service: Gastroenterology;  Laterality: N/A;   HAMMER TOE SURGERY Right 11/21/2020   Procedure: HAMMERTOE CORRECTION;  Surgeon: Ashley Soulier, DPM;  Location: [facility];  Service: Podiatry;  Laterality: Right;   INSERTION OF MESH  04/16/2021   Procedure: INSERTION OF MESH;  Surgeon: Desiderio Schanz, MD;  Location: [facility] ORS;  Service: General;;   KNEE ARTHROSCOPY WITH MEDIAL MENISECTOMY Right 09/16/2012   Procedure: RIGHT KNEE ARTHROSCOPY WITH MEDIAL AND LATERAL MENISECTOMY, CHONDROPLASTY;  Surgeon: Toribio JULIANNA Chancy, MD;  Location: [facility];  Service: Orthopedics;  Laterality: Right;  RIGHT KNEE SCOPE MEDIAL MENISCECTOMY   LEFT HEART CATH AND CORONARY ANGIOGRAPHY N/A 06/11/2016   Procedure: Left Heart Cath and Coronary Angiography;  Surgeon: Perla Evalene PARAS, MD;  Location: [facility] INVASIVE CV LAB;  Service: Cardiovascular;  Laterality: N/A;   UMBILICAL HERNIA REPAIR N/A 04/16/2021   Procedure: HERNIA REPAIR UMBILICAL ADULT, open;  Surgeon: Desiderio Schanz, MD;  Location: [facility] ORS;  Service: General;  Laterality: N/A;    Physical Exam   Triage Vital Signs: ED Triage Vitals  Encounter Vitals Group     BP 11/02/23 0137 139/74     Girls Systolic BP Percentile --      Girls Diastolic BP Percentile --      Boys Systolic BP Percentile --      Boys Diastolic BP Percentile --      Pulse Rate 11/02/23 0137 93     Resp 11/02/23 0137 20     Temp 11/02/23 0137 (!) 97.5 F (36.4 C)     Temp src --      SpO2 11/02/23 0137 97 %     Weight 11/02/23 0136 (!) 321 lb (145.6 kg)     Height 11/02/23 0136 6' 2 (1.88 m)     Head Circumference --      Peak Flow --       Pain Score 11/02/23 0136 9     Pain Loc --      Pain Education --      Exclude from Growth Chart --     Most recent vital signs: Vitals:   11/02/23 0137  BP: 139/74  Pulse: 93  Resp: 20  Temp: (!) 97.5 F (36.4 C)  SpO2: 97%    General: Awake, no distress.  CV:  Good peripheral perfusion.  Resp:  Normal effort.  Abd:  No distention.  Soft nontender Other:  There is Candida intertrigo at the right groin.  No palpable hernias.  No crepitus, genitalia normal.   ED Results / Procedures / Treatments   Labs (all labs ordered are listed, but only abnormal results are displayed) Labs Reviewed  CBC WITH DIFFERENTIAL/PLATELET - Abnormal; Notable  for the following components:      Result Value   WBC 10.9 (*)    Lymphs Abs 4.1 (*)    Abs Immature Granulocytes 0.23 (*)    All other components within normal limits  COMPREHENSIVE METABOLIC PANEL WITH GFR - Abnormal; Notable for the following components:   Sodium 134 (*)    Potassium 3.4 (*)    Glucose, Bld 187 (*)    All other components within normal limits  URINALYSIS, ROUTINE W REFLEX MICROSCOPIC - Abnormal; Notable for the following components:   Color, Urine YELLOW (*)    APPearance CLEAR (*)    Glucose, UA >=500 (*)    All other components within normal limits     EKG    RADIOLOGY CT abdomen pelvis interpreted by me, negative for ureterolithiasis.  Radiology report reviewed   PROCEDURES:  Procedures   MEDICATIONS ORDERED IN ED: Medications - No data to display   IMPRESSION / MDM / ASSESSMENT AND PLAN / ED COURSE  I reviewed the triage vital signs and the nursing notes.  DDx: Candidiasis, UTI, AKI, electrolyte derangement, kidney stone  Patient's presentation is most consistent with acute presentation with potential threat to life or bodily function.  Patient presents with right groin pain, found to have cutaneous candidiasis.  Will start on nystatin  powder.  Stable for discharge       FINAL  CLINICAL IMPRESSION(S) / ED DIAGNOSES   Final diagnoses:  Candidiasis, intertriginous  Type 2 diabetes mellitus without complication, with long-term current use of insulin  (HCC)     Rx / DC Orders   ED Discharge Orders          Ordered    nystatin  (MYCOSTATIN /NYSTOP ) powder  3 times daily        11/02/23 0330             Note:  This document was prepared using Dragon voice recognition software and may include unintentional dictation errors.   Viviann Pastor, MD 11/02/23 [phone]

## 2023-11-03 ENCOUNTER — Encounter: Payer: Self-pay | Admitting: Internal Medicine

## 2023-11-03 ENCOUNTER — Ambulatory Visit: Admitting: Internal Medicine

## 2023-11-03 VITALS — BP 118/67 | HR 80 | Temp 97.3°F | Resp 16 | Ht 74.0 in | Wt 321.4 lb

## 2023-11-03 DIAGNOSIS — E1169 Type 2 diabetes mellitus with other specified complication: Secondary | ICD-10-CM

## 2023-11-03 DIAGNOSIS — E876 Hypokalemia: Secondary | ICD-10-CM

## 2023-11-03 DIAGNOSIS — E1165 Type 2 diabetes mellitus with hyperglycemia: Secondary | ICD-10-CM | POA: Diagnosis not present

## 2023-11-03 DIAGNOSIS — Z794 Long term (current) use of insulin: Secondary | ICD-10-CM | POA: Diagnosis not present

## 2023-11-03 DIAGNOSIS — B372 Candidiasis of skin and nail: Secondary | ICD-10-CM

## 2023-11-03 DIAGNOSIS — Z6841 Body Mass Index (BMI) 40.0 and over, adult: Secondary | ICD-10-CM

## 2023-11-03 DIAGNOSIS — E785 Hyperlipidemia, unspecified: Secondary | ICD-10-CM | POA: Diagnosis not present

## 2023-11-03 LAB — POCT GLYCOSYLATED HEMOGLOBIN (HGB A1C): Hemoglobin A1C: 7.8 % — AB (ref 4.0–5.6)

## 2023-11-03 MED ORDER — TRESIBA FLEXTOUCH 100 UNIT/ML ~~LOC~~ SOPN: PEN_INJECTOR | SUBCUTANEOUS | 3 refills | Status: DC

## 2023-11-03 MED ORDER — TERBINAFINE HCL 250 MG PO TABS
ORAL_TABLET | ORAL | 0 refills | Status: DC
Start: 2023-11-03 — End: 2023-12-24

## 2023-11-03 MED ORDER — POTASSIUM CHLORIDE CRYS ER 10 MEQ PO TBCR: EXTENDED_RELEASE_TABLET | ORAL | 3 refills | Status: DC

## 2023-11-03 NOTE — Progress Notes (Signed)
 [facility] [address] [place], [place] [address]  Internal MEDICINE  Office Visit Note  Patient Name: Gerald [name]  [id number]  [id number]  Date of Service: 11/09/2023  Chief Complaint  Patient presents with   Follow-up    ED f/u     HPI Pt is seen today ED follow up Pt was seen in ED for right groin pain and was dxed with groin candidiasis, antifungal powder was prescribed, pt believes he is somewhat better  Labs reviewed, has low sodium and potassium, he is on diuretic prn but does not take potassium Improving diabetic control, now on 30 units of triesba Pt is on crestor  and REPATHA      Current Medication: Outpatient Encounter Medications as of 11/03/2023  Medication Sig   potassium chloride  (KLOR-CON  M) 10 MEQ tablet Take one tab as needed with Lasix  only for low potassium   terbinafine (LAMISIL) 250 MG tablet Take one tab a day for skin fungal infection   aspirin  EC 81 MG tablet Take 1 tablet (81 mg total) by mouth daily. Swallow whole.   citalopram  (CELEXA ) 40 MG tablet TAKE 1 TABLET BY MOUTH EVERY DAY   clopidogrel  (PLAVIX ) 75 MG tablet Take 1 tablet (75 mg total) by mouth daily.   Continuous Glucose Sensor (DEXCOM G7 SENSOR) MISC Use every 10 days as directed. Dx: E11.65.   cyclobenzaprine  (FLEXERIL ) 10 MG tablet TAKE 1 TABLET (10 MG TOTAL) BY MOUTH AT BEDTIME FOR BACK SPASM AS NEEDED ONLY   Dulaglutide  (TRULICITY ) 1.5 MG/0.5ML SOAJ Inject 1.5 mg into the skin once a week.   empagliflozin  (JARDIANCE ) 25 MG TABS tablet Take 1 tablet (25 mg total) by mouth daily before breakfast.   Evolocumab  (REPATHA  SURECLICK) 140 MG/ML SOAJ Inject 140 mg into the skin every 14 (fourteen) days.   furosemide  (LASIX ) 20 MG tablet TAKE 1 TABLET BY MOUTH EVERY DAY AS NEEDED FOR LEG SWELLING   gabapentin  (NEURONTIN ) 300 MG capsule TAKE 1 CAPSULE BY MOUTH THREE TIMES A DAY   insulin  aspart (NOVOLOG  FLEXPEN) 100 UNIT/ML FlexPen Inject with meals per sliding scale. Max  14 units   insulin  degludec (TRESIBA  FLEXTOUCH) 100 UNIT/ML FlexTouch Pen Inject 30 units in am sq   Insulin  Pen Needle (BD PEN NEEDLE NANO U/F) 32G X 4 MM MISC Use as directed once a daily   lamoTRIgine  (LAMICTAL ) 25 MG tablet Take 1 tablet (25 mg total) by mouth at bedtime for 14 days, THEN 1 tablet (25 mg total) 2 (two) times daily for 14 days.   lisinopril -hydrochlorothiazide  (ZESTORETIC ) 20-25 MG tablet TAKE 1 TABLET BY MOUTH EVERY DAY   metoprolol  tartrate (LOPRESSOR ) 100 MG tablet TAKE 1 TABLET 2 HR PRIOR TO CARDIAC PROCEDURE   Multiple Vitamins-Minerals (MULTIVITAMIN ADULTS PO) Take 1 tablet by mouth daily.   nitroGLYCERIN  (NITROSTAT ) 0.4 MG SL tablet Place 1 tablet (0.4 mg total) under the tongue every 5 (five) minutes x 3 doses as needed for chest pain.   nortriptyline (PAMELOR) 10 MG capsule Take 10 mg by mouth at bedtime.   nystatin  (MYCOSTATIN /NYSTOP ) powder Apply 1 Application topically 3 (three) times daily for 20 days.   nystatin -triamcinolone  ointment (MYCOLOG) Apply 1 Application topically 2 (two) times daily.   rosuvastatin  (CRESTOR ) 5 MG tablet Take 1 tablet (5 mg total) by mouth at bedtime.   sulfamethoxazole -trimethoprim  (BACTRIM  DS) 800-160 MG tablet TAKE 1 TABLET BY MOUTH EVERY 12 HOURS   Vibegron  (GEMTESA ) 75 MG TABS Take 1 tablet (75 mg total) by mouth daily.   [  DISCONTINUED] TRESIBA  FLEXTOUCH 100 UNIT/ML FlexTouch Pen INJECT 10 UNITS INTO THE SKIN DAILY   No facility-administered encounter medications on file as of 11/03/2023.    Surgical History: Past Surgical History:  Procedure Laterality Date   BONE EXCISION Right 11/21/2020   Procedure: PART EXCISION BONE- PHALANX;  Surgeon: Ashley Soulier, DPM;  Location: [facility];  Service: Podiatry;  Laterality: Right;   CARDIAC CATHETERIZATION N/A 09/2011   [facility]; EF 50% with 30% mid LAD stenosis and no obstructive disease.   CARDIAC CATHETERIZATION  09/2010   [facility]; Mid LAD 40% stenosis; Mid Circumflex:Normal; Mid  RCA; Normal   CARDIAC CATHETERIZATION     COLONOSCOPY     COLONOSCOPY WITH PROPOFOL  N/A 04/10/2021   Procedure: COLONOSCOPY WITH PROPOFOL ;  Surgeon: Therisa Bi, MD;  Location: [facility] ENDOSCOPY;  Service: Gastroenterology;  Laterality: N/A;   ESOPHAGOGASTRODUODENOSCOPY N/A 04/10/2021   Procedure: ESOPHAGOGASTRODUODENOSCOPY (EGD);  Surgeon: Therisa Bi, MD;  Location: [facility] ENDOSCOPY;  Service: Gastroenterology;  Laterality: N/A;   ESOPHAGOGASTRODUODENOSCOPY (EGD) WITH PROPOFOL  N/A 06/10/2017   Procedure: ESOPHAGOGASTRODUODENOSCOPY (EGD) WITH PROPOFOL ;  Surgeon: Therisa Bi, MD;  Location: [facility] END[facility]COPY;  Service: Gastroenterology;  Laterality: N/A;   ESOPHAGOGASTRODUODENOSCOPY (EGD) WITH PROPOFOL  N/A 01/01/2022   Procedure: ESOPHAGOGASTRODUODENOSCOPY (EGD) WITH PROPOFOL ;  Surgeon: Therisa Bi, MD;  Location: [facility] ENDOSCOPY;  Service: Gastroenterology;  Laterality: N/A;   ESOPHAGOGASTRODUODENOSCOPY (EGD) WITH PROPOFOL  N/A 01/06/2022   Procedure: ESOPHAGOGASTRODUODENOSCOPY (EGD) WITH PROPOFOL ;  Surgeon: Therisa Bi, MD;  Location: [facility] ENDOSCOPY;  Service: Gastroenterology;  Laterality: N/A;   HAMMER TOE SURGERY Right 11/21/2020   Procedure: HAMMERTOE CORRECTION;  Surgeon: Ashley Soulier, DPM;  Location: [facility];  Service: Podiatry;  Laterality: Right;   INSERTION OF MESH  04/16/2021   Procedure: INSERTION OF MESH;  Surgeon: Desiderio Schanz, MD;  Location: [facility] ORS;  Service: General;;   KNEE ARTHROSCOPY WITH MEDIAL MENISECTOMY Right 09/16/2012   Procedure: RIGHT KNEE ARTHROSCOPY WITH MEDIAL AND LATERAL MENISECTOMY, CHONDROPLASTY;  Surgeon: Toribio JULIANNA Chancy, MD;  Location: [facility];  Service: Orthopedics;  Laterality: Right;  RIGHT KNEE SCOPE MEDIAL MENISCECTOMY   LEFT HEART CATH AND CORONARY ANGIOGRAPHY N/A 06/11/2016   Procedure: Left Heart Cath and Coronary Angiography;  Surgeon: Perla Evalene PARAS, MD;  Location: [facility] INVASIVE CV LAB;  Service: Cardiovascular;  Laterality: N/A;    UMBILICAL HERNIA REPAIR N/A 04/16/2021   Procedure: HERNIA REPAIR UMBILICAL ADULT, open;  Surgeon: Desiderio Schanz, MD;  Location: [facility] ORS;  Service: General;  Laterality: N/A;    Medical History: Past Medical History:  Diagnosis Date   Abnormal LFTs    Anxiety    Atypical chest pain    Borderline diabetes    CAD (coronary artery disease)    a.) PCI and stent placement (unknown type) to mLAD and mLCx; date unknown. b.) LHC 09/10/2010: 40% mLAD; no intervention. c.) LHC 09/12/2011: EF 60%; 30% ISR mLAD, 40% dLAD, 30% pLCx, 30% ISR mLCx, 40% mRCA; no intervention. d.) LHC 05/23/2014: EF >55%; 30% p-mLAD; no intervention. e.) LHC 06/11/2016: EF 55-65%; LVEDP norm; 30-40% p-mLAD; no intervention.   Carcinoma (HCC)    CHF (congestive heart failure) (HCC)    Chronic pain syndrome    Depression    Diabetes mellitus without complication (HCC)    Fundic gland polyps of stomach, benign    Gastritis    GERD (gastroesophageal reflux disease)    Hepatic steatosis    Hyperlipidemia    Hypertension    Migraines    Myocardial infarction [facility]) 2008  was told by PCP   Nonischemic cardiomyopathy (HCC)    a.) TTE 09/12/2011: EF 35-45%. b.) LHC 09/12/2011: EF 60%. c.) TTE 06/04/2012: EF 55-60%. d.) LHC 05/23/2014: EF >55%. e.) TTE 06/11/2016: EF 55-60%; G1DD. f.) LHC 06/11/2016: EF 55-65%. g.) TTE 01/08/2018: EF 60-65%.   Obesity    Occlusion and stenosis of bilateral carotid arteries    OSA on CPAP    Osteoarthritis of both knees    Shortness of breath    Squamous cell carcinoma of skin 02/27/2020   left distal lat deltoid - [facility]   Stroke (HCC)    10/24, 3/25    Family History: Family History  Problem Relation Age of Onset   Heart disease Father 57       MI   Heart attack Father    Stomach cancer Father    Hypertension Mother    Breast cancer Mother     Social History   Socioeconomic History   Marital status: Married    Spouse name: Not on file   Number of children: 3   Years of  education: Not on file   Highest education level: Not on file  Occupational History   Occupation: Camera operator  Tobacco Use   Smoking status: Never    Passive exposure: Past   Smokeless tobacco: Former    Types: Chew    Quit date: 1987  Vaping Use   Vaping status: Never Used  Substance and Sexual Activity   Alcohol use: Not Currently   Drug use: No   Sexual activity: Yes  Other Topics Concern   Not on file  Social History Narrative   Married.  52 yo son.   Wife on disability for bipolar disorder.     Social Drivers of Corporate investment banker Strain: Not on file  Food Insecurity: Food Insecurity Present (04/17/2023)   Hunger Vital Sign    Worried About Running Out of Food in the Last Year: Sometimes true    Ran Out of Food in the Last Year: Sometimes true  Transportation Needs: No Transportation Needs (04/17/2023)   PRAPARE - Administrator, Civil Service (Medical): No    Lack of Transportation (Non-Medical): No  Physical Activity: Not on file  Stress: Not on file  Social Connections: Unknown (11/18/2021)   Received from [facility]   Social Network    Social Network: Not on file  Intimate Partner Violence: Not At Risk (04/17/2023)   Humiliation, Afraid, Rape, and Kick questionnaire    Fear of Current or Ex-Partner: No    Emotionally Abused: No    Physically Abused: No    Sexually Abused: No      Review of Systems  Constitutional:  Negative for fatigue and fever.  HENT:  Negative for congestion, mouth sores and postnasal drip.   Respiratory:  Negative for cough.   Cardiovascular:  Negative for chest pain.  Genitourinary:  Negative for flank pain.  Psychiatric/Behavioral: Negative.      Vital Signs: BP 118/67   Pulse 80   Temp (!) 97.3 F (36.3 C)   Resp 16   Ht 6' 2 (1.88 m)   Wt (!) 321 lb 6.4 oz (145.8 kg)   SpO2 96%   BMI 41.27 kg/m    Physical Exam Constitutional:      Appearance: Normal appearance.  HENT:     Head:  Normocephalic and atraumatic.     Nose: Nose normal.     Mouth/Throat:  Mouth: Mucous membranes are moist.     Pharynx: No posterior oropharyngeal erythema.  Eyes:     Extraocular Movements: Extraocular movements intact.     Pupils: Pupils are equal, round, and reactive to light.  Cardiovascular:     Pulses: Normal pulses.     Heart sounds: Normal heart sounds.  Pulmonary:     Effort: Pulmonary effort is normal.     Breath sounds: Normal breath sounds.  Neurological:     General: No focal deficit present.     Mental Status: He is alert.  Psychiatric:        Mood and Affect: Mood normal.        Behavior: Behavior normal.        Assessment/Plan: 1. Uncontrolled diabetes mellitus with hyperglycemia, with long-term current use of insulin  (HCC) (Primary) Updated all medications, pt is on Triseba 30 units, plus meal time novolog  per sliding scal, jatdiance 25 mg po every day and trulicity  1.5 mg q week  - insulin  degludec (TRESIBA  FLEXTOUCH) 100 UNIT/ML FlexTouch Pen; Inject 30 units in am sq  Dispense: 15 mL; Refill: 3 - POCT glycosylated hemoglobin (Hb A1C) improved 7.8  2. Candidiasis, intertrigo Continue topical, add oral lamisil  - terbinafine (LAMISIL) 250 MG tablet; Take one tab a day for skin fungal infection  Dispense: 30 tablet; Refill: 0  3. Hypokalemia Pt is instructed to take potassium along with lasix  as needed  - potassium chloride  (KLOR-CON  M) 10 MEQ tablet; Take one tab as needed with Lasix  only for low potassium  Dispense: 15 tablet; Refill: 3  4. Hyperlipidemia associated with type 2 diabetes mellitus (HCC) Pt is on crestor  and Repatha    5. Morbid obesity with BMI of 40.0-44.9, adult (HCC) Pt is gradually working on it   General Counseling: Quintin verbalizes understanding of the findings of todays visit and agrees with plan of treatment. I have discussed any further diagnostic evaluation that may be needed or ordered today. We also reviewed his  medications today. he has been encouraged to call the office with any questions or concerns that should arise related to todays visit.    Orders Placed This Encounter  Procedures   POCT glycosylated hemoglobin (Hb A1C)    Meds ordered this encounter  Medications   insulin  degludec (TRESIBA  FLEXTOUCH) 100 UNIT/ML FlexTouch Pen    Sig: Inject 30 units in am sq    Dispense:  15 mL    Refill:  3   potassium chloride  (KLOR-CON  M) 10 MEQ tablet    Sig: Take one tab as needed with Lasix  only for low potassium    Dispense:  15 tablet    Refill:  3   terbinafine (LAMISIL) 250 MG tablet    Sig: Take one tab a day for skin fungal infection    Dispense:  30 tablet    Refill:  0    Total time spent:35 Minutes Time spent includes review of chart, medications, test results, and follow up plan with the patient.   [place] Controlled Substance Database was reviewed by me.   Dr Keijuan Schellhase M Baraa Tubbs Internal medicine

## 2023-11-16 ENCOUNTER — Other Ambulatory Visit: Payer: Self-pay | Admitting: Internal Medicine

## 2023-11-16 DIAGNOSIS — E876 Hypokalemia: Secondary | ICD-10-CM

## 2023-11-18 ENCOUNTER — Telehealth: Payer: Self-pay | Admitting: Internal Medicine

## 2023-11-18 NOTE — Telephone Encounter (Signed)
 Lvm to schedule new patient appointment with dsk for cpap use-Toni

## 2023-11-24 ENCOUNTER — Ambulatory Visit: Admitting: Internal Medicine

## 2023-11-27 ENCOUNTER — Encounter: Payer: Self-pay | Admitting: Physician Assistant

## 2023-11-27 ENCOUNTER — Ambulatory Visit (INDEPENDENT_AMBULATORY_CARE_PROVIDER_SITE_OTHER): Admitting: Physician Assistant

## 2023-11-27 ENCOUNTER — Telehealth: Payer: Self-pay | Admitting: Physician Assistant

## 2023-11-27 VITALS — BP 166/93 | HR 82 | Temp 97.4°F | Resp 16 | Ht 74.0 in | Wt 330.0 lb

## 2023-11-27 DIAGNOSIS — I10 Essential (primary) hypertension: Secondary | ICD-10-CM | POA: Diagnosis not present

## 2023-11-27 DIAGNOSIS — G629 Polyneuropathy, unspecified: Secondary | ICD-10-CM | POA: Diagnosis not present

## 2023-11-27 DIAGNOSIS — E559 Vitamin D deficiency, unspecified: Secondary | ICD-10-CM

## 2023-11-27 DIAGNOSIS — E1165 Type 2 diabetes mellitus with hyperglycemia: Secondary | ICD-10-CM

## 2023-11-27 DIAGNOSIS — R946 Abnormal results of thyroid function studies: Secondary | ICD-10-CM

## 2023-11-27 DIAGNOSIS — I6930 Unspecified sequelae of cerebral infarction: Secondary | ICD-10-CM

## 2023-11-27 DIAGNOSIS — Z794 Long term (current) use of insulin: Secondary | ICD-10-CM | POA: Diagnosis not present

## 2023-11-27 DIAGNOSIS — R5383 Other fatigue: Secondary | ICD-10-CM

## 2023-11-27 DIAGNOSIS — E538 Deficiency of other specified B group vitamins: Secondary | ICD-10-CM | POA: Diagnosis not present

## 2023-11-27 MED ORDER — LISINOPRIL 10 MG PO TABS: 10.0000 mg | ORAL_TABLET | Freq: Every day | ORAL | 3 refills | Status: AC

## 2023-11-27 MED ORDER — TRULICITY 3 MG/0.5ML ~~LOC~~ SOAJ: 3.0000 mg | SUBCUTANEOUS | 2 refills | Status: DC

## 2023-11-27 NOTE — Telephone Encounter (Signed)
 Lvm to schedule 1 mo f/u for BP check per Lauren-Toni

## 2023-11-27 NOTE — Progress Notes (Signed)
 [facility] [address] [place], [place] [address]  Internal MEDICINE  Office Visit Note  Patient Name: Gerald [name]  [id number]  [id number]  Date of Service: 11/27/2023  Chief Complaint  Patient presents with   Depression   Diabetes   Gastroesophageal Reflux   Hyperlipidemia   Hypertension   Follow-up    HPI Pt is here for routine follow up -BP elevated at home this morning, and for the last month likely due to stress and poor sleep. -seeing neurology next Wednesday for follow up, still having headaches and neuropathy which impacts his sleep. Left leg stays numb since stroke, but comes and goes in right leg. Uses walker around the house. Cane today. Had a fall about a month ago due to balance -taking 30units tresiba  and 14 novalog typically at meals. Would like to increase trulicity . In range 68% on his monitor  Current Medication: Outpatient Encounter Medications as of 11/27/2023  Medication Sig   Dulaglutide  (TRULICITY ) 3 MG/0.5ML SOAJ Inject 3 mg as directed once a week.   lisinopril  (ZESTRIL ) 10 MG tablet Take 1 tablet (10 mg total) by mouth daily.   aspirin  EC 81 MG tablet Take 1 tablet (81 mg total) by mouth daily. Swallow whole. (Patient not taking: Reported on 11/27/2023)   citalopram  (CELEXA ) 40 MG tablet TAKE 1 TABLET BY MOUTH EVERY DAY   clopidogrel  (PLAVIX ) 75 MG tablet Take 1 tablet (75 mg total) by mouth daily.   Continuous Glucose Sensor (DEXCOM G7 SENSOR) MISC Use every 10 days as directed. Dx: E11.65.   cyclobenzaprine  (FLEXERIL ) 10 MG tablet TAKE 1 TABLET (10 MG TOTAL) BY MOUTH AT BEDTIME FOR BACK SPASM AS NEEDED ONLY   empagliflozin  (JARDIANCE ) 25 MG TABS tablet Take 1 tablet (25 mg total) by mouth daily before breakfast.   Evolocumab  (REPATHA  SURECLICK) 140 MG/ML SOAJ Inject 140 mg into the skin every 14 (fourteen) days.   furosemide  (LASIX ) 20 MG tablet TAKE 1 TABLET BY MOUTH EVERY DAY AS NEEDED FOR LEG SWELLING   gabapentin   (NEURONTIN ) 300 MG capsule TAKE 1 CAPSULE BY MOUTH THREE TIMES A DAY   insulin  aspart (NOVOLOG  FLEXPEN) 100 UNIT/ML FlexPen Inject with meals per sliding scale. Max 14 units   insulin  degludec (TRESIBA  FLEXTOUCH) 100 UNIT/ML FlexTouch Pen Inject 30 units in am sq   Insulin  Pen Needle (BD PEN NEEDLE NANO U/F) 32G X 4 MM MISC Use as directed once a daily   lamoTRIgine  (LAMICTAL ) 25 MG tablet Take 1 tablet (25 mg total) by mouth at bedtime for 14 days, THEN 1 tablet (25 mg total) 2 (two) times daily for 14 days. (Patient not taking: No sig reported)   lisinopril -hydrochlorothiazide  (ZESTORETIC ) 20-25 MG tablet TAKE 1 TABLET BY MOUTH EVERY DAY   metoprolol  tartrate (LOPRESSOR ) 100 MG tablet TAKE 1 TABLET 2 HR PRIOR TO CARDIAC PROCEDURE (Patient not taking: Reported on 11/27/2023)   Multiple Vitamins-Minerals (MULTIVITAMIN ADULTS PO) Take 1 tablet by mouth daily.   nitroGLYCERIN  (NITROSTAT ) 0.4 MG SL tablet Place 1 tablet (0.4 mg total) under the tongue every 5 (five) minutes x 3 doses as needed for chest pain.   nortriptyline (PAMELOR) 10 MG capsule Take 10 mg by mouth at bedtime.   nystatin -triamcinolone  ointment (MYCOLOG) Apply 1 Application topically 2 (two) times daily.   potassium chloride  (KLOR-CON  M) 10 MEQ tablet TAKE 1 TABLET BY MOUTH AS NEEDED WITH FUROSEMIDE . (ONLY FOR LOW POTASSIUM)   rosuvastatin  (CRESTOR ) 5 MG tablet Take 1 tablet (5 mg total) by mouth  at bedtime.   sulfamethoxazole -trimethoprim  (BACTRIM  DS) 800-160 MG tablet TAKE 1 TABLET BY MOUTH EVERY 12 HOURS   terbinafine (LAMISIL) 250 MG tablet Take one tab a day for skin fungal infection (Patient not taking: Reported on 11/27/2023)   Vibegron  (GEMTESA ) 75 MG TABS Take 1 tablet (75 mg total) by mouth daily.   [DISCONTINUED] Dulaglutide  (TRULICITY ) 1.5 MG/0.5ML SOAJ Inject 1.5 mg into the skin once a week.   No facility-administered encounter medications on file as of 11/27/2023.    Surgical History: Past Surgical History:   Procedure Laterality Date   BONE EXCISION Right 11/21/2020   Procedure: PART EXCISION BONE- PHALANX;  Surgeon: Ashley Soulier, DPM;  Location: [facility];  Service: Podiatry;  Laterality: Right;   CARDIAC CATHETERIZATION N/A 09/2011   [facility]; EF 50% with 30% mid LAD stenosis and no obstructive disease.   CARDIAC CATHETERIZATION  09/2010   [facility]; Mid LAD 40% stenosis; Mid Circumflex:Normal; Mid RCA; Normal   CARDIAC CATHETERIZATION     COLONOSCOPY     COLONOSCOPY WITH PROPOFOL  N/A 04/10/2021   Procedure: COLONOSCOPY WITH PROPOFOL ;  Surgeon: Therisa Bi, MD;  Location: [facility] END[facility]COPY;  Service: Gastroenterology;  Laterality: N/A;   ESOPHAGOGASTRODUODENOSCOPY N/A 04/10/2021   Procedure: ESOPHAGOGASTRODUODENOSCOPY (EGD);  Surgeon: Therisa Bi, MD;  Location: [facility] END[facility]COPY;  Service: Gastroenterology;  Laterality: N/A;   ESOPHAGOGASTRODUODENOSCOPY (EGD) WITH PROPOFOL  N/A 06/10/2017   Procedure: ESOPHAGOGASTRODUODENOSCOPY (EGD) WITH PROPOFOL ;  Surgeon: Therisa Bi, MD;  Location: [facility] ENDOSCOPY;  Service: Gastroenterology;  Laterality: N/A;   ESOPHAGOGASTRODUODENOSCOPY (EGD) WITH PROPOFOL  N/A 01/01/2022   Procedure: ESOPHAGOGASTRODUODENOSCOPY (EGD) WITH PROPOFOL ;  Surgeon: Therisa Bi, MD;  Location: [facility] ENDOSCOPY;  Service: Gastroenterology;  Laterality: N/A;   ESOPHAGOGASTRODUODENOSCOPY (EGD) WITH PROPOFOL  N/A 01/06/2022   Procedure: ESOPHAGOGASTRODUODENOSCOPY (EGD) WITH PROPOFOL ;  Surgeon: Therisa Bi, MD;  Location: [facility] ENDOSCOPY;  Service: Gastroenterology;  Laterality: N/A;   HAMMER TOE SURGERY Right 11/21/2020   Procedure: HAMMERTOE CORRECTION;  Surgeon: Ashley Soulier, DPM;  Location: [facility];  Service: Podiatry;  Laterality: Right;   INSERTION OF MESH  04/16/2021   Procedure: INSERTION OF MESH;  Surgeon: Desiderio Schanz, MD;  Location: [facility] ORS;  Service: General;;   KNEE ARTHROSCOPY WITH MEDIAL MENISECTOMY Right 09/16/2012   Procedure: RIGHT KNEE ARTHROSCOPY WITH MEDIAL AND  LATERAL MENISECTOMY, CHONDROPLASTY;  Surgeon: Toribio JULIANNA Chancy, MD;  Location: [facility];  Service: Orthopedics;  Laterality: Right;  RIGHT KNEE SCOPE MEDIAL MENISCECTOMY   LEFT HEART CATH AND CORONARY ANGIOGRAPHY N/A 06/11/2016   Procedure: Left Heart Cath and Coronary Angiography;  Surgeon: Perla Evalene PARAS, MD;  Location: [facility] INVASIVE CV LAB;  Service: Cardiovascular;  Laterality: N/A;   UMBILICAL HERNIA REPAIR N/A 04/16/2021   Procedure: HERNIA REPAIR UMBILICAL ADULT, open;  Surgeon: Desiderio Schanz, MD;  Location: [facility] ORS;  Service: General;  Laterality: N/A;    Medical History: Past Medical History:  Diagnosis Date   Abnormal LFTs    Anxiety    Atypical chest pain    Borderline diabetes    CAD (coronary artery disease)    a.) PCI and stent placement (unknown type) to mLAD and mLCx; date unknown. b.) LHC 09/10/2010: 40% mLAD; no intervention. c.) LHC 09/12/2011: EF 60%; 30% ISR mLAD, 40% dLAD, 30% pLCx, 30% ISR mLCx, 40% mRCA; no intervention. d.) LHC 05/23/2014: EF >55%; 30% p-mLAD; no intervention. e.) LHC 06/11/2016: EF 55-65%; LVEDP norm; 30-40% p-mLAD; no intervention.   Carcinoma (HCC)    CHF (congestive heart failure) (HCC)    Chronic pain  syndrome    Depression    Diabetes mellitus without complication (HCC)    Fundic gland polyps of stomach, benign    Gastritis    GERD (gastroesophageal reflux disease)    Hepatic steatosis    Hyperlipidemia    Hypertension    Migraines    Myocardial infarction [facility]) 2008   was told by PCP   Nonischemic cardiomyopathy (HCC)    a.) TTE 09/12/2011: EF 35-45%. b.) LHC 09/12/2011: EF 60%. c.) TTE 06/04/2012: EF 55-60%. d.) LHC 05/23/2014: EF >55%. e.) TTE 06/11/2016: EF 55-60%; G1DD. f.) LHC 06/11/2016: EF 55-65%. g.) TTE 01/08/2018: EF 60-65%.   Obesity    Occlusion and stenosis of bilateral carotid arteries    OSA on CPAP    Osteoarthritis of both knees    Shortness of breath    Squamous cell carcinoma of skin 02/27/2020    left distal lat deltoid - [facility]   Stroke (HCC)    10/24, 3/25    Family History: Family History  Problem Relation Age of Onset   Heart disease Father 62       MI   Heart attack Father    Stomach cancer Father    Hypertension Mother    Breast cancer Mother     Social History   Socioeconomic History   Marital status: Married    Spouse name: Not on file   Number of children: 3   Years of education: Not on file   Highest education level: Not on file  Occupational History   Occupation: Camera operator  Tobacco Use   Smoking status: Never    Passive exposure: Past   Smokeless tobacco: Former    Types: Chew    Quit date: 1987  Vaping Use   Vaping status: Never Used  Substance and Sexual Activity   Alcohol use: Not Currently   Drug use: No   Sexual activity: Yes  Other Topics Concern   Not on file  Social History Narrative   Married.  62 yo son.   Wife on disability for bipolar disorder.     Social Drivers of Corporate investment banker Strain: Not on file  Food Insecurity: Food Insecurity Present (04/17/2023)   Hunger Vital Sign    Worried About Running Out of Food in the Last Year: Sometimes true    Ran Out of Food in the Last Year: Sometimes true  Transportation Needs: No Transportation Needs (04/17/2023)   PRAPARE - Administrator, Civil Service (Medical): No    Lack of Transportation (Non-Medical): No  Physical Activity: Not on file  Stress: Not on file  Social Connections: Unknown (11/18/2021)   Received from [facility]   Social Network    Social Network: Not on file  Intimate Partner Violence: Not At Risk (04/17/2023)   Humiliation, Afraid, Rape, and Kick questionnaire    Fear of Current or Ex-Partner: No    Emotionally Abused: No    Physically Abused: No    Sexually Abused: No      Review of Systems  Constitutional:  Positive for fatigue. Negative for chills and unexpected weight change.  HENT:  Negative for congestion, postnasal  drip, rhinorrhea, sneezing and sore throat.   Eyes:  Negative for redness.  Respiratory:  Negative for cough, chest tightness and shortness of breath.   Cardiovascular:  Negative for chest pain and palpitations.  Gastrointestinal:  Negative for anal bleeding, constipation, diarrhea, nausea and vomiting.  Genitourinary:  Negative for dysuria.  Musculoskeletal:  Positive for arthralgias, back pain, gait problem and myalgias. Negative for joint swelling and neck pain.  Skin:  Negative for rash.  Neurological:  Positive for weakness and numbness.  Hematological:  Negative for adenopathy. Does not bruise/bleed easily.  Psychiatric/Behavioral:  Positive for behavioral problems (Depression) and sleep disturbance. Negative for self-injury and suicidal ideas. The patient is nervous/anxious.     Vital Signs: BP (!) 166/93   Pulse 82   Temp (!) 97.4 F (36.3 C)   Resp 16   Ht 6' 2 (1.88 m)   Wt (!) 330 lb (149.7 kg)   SpO2 98%   BMI 42.37 kg/m    Physical Exam Vitals and nursing note reviewed.  Constitutional:      Appearance: Normal appearance.  HENT:     Head: Normocephalic and atraumatic.  Eyes:     Extraocular Movements: Extraocular movements intact.  Cardiovascular:     Rate and Rhythm: Normal rate and regular rhythm.     Pulses: Normal pulses.     Heart sounds: Normal heart sounds.  Pulmonary:     Effort: Pulmonary effort is normal.     Breath sounds: Normal breath sounds.  Skin:    General: Skin is warm and dry.  Neurological:     General: No focal deficit present.     Mental Status: He is alert.     Gait: Gait abnormal.     Comments: Walking with cane  Psychiatric:        Mood and Affect: Mood normal.        Behavior: Behavior normal.        Assessment/Plan: 1. Essential hypertension (Primary) Will add 10mg  lisinopril  to take with his zestoretic  for a total of 30mg  lisinopril . May titrate up in future if not improving. Will also monitor labs for potassium  levels. - lisinopril  (ZESTRIL ) 10 MG tablet; Take 1 tablet (10 mg total) by mouth daily.  Dispense: 90 tablet; Refill: 3  2. Type 2 diabetes mellitus with hyperglycemia, with long-term current use of insulin  (HCC) Will increase to 3mg  weekly trulicity  and continue insulin  as before.  - Dulaglutide  (TRULICITY ) 3 MG/0.5ML SOAJ; Inject 3 mg as directed once a week.  Dispense: 2 mL; Refill: 2  3. History of stroke with current residual effects Followed by neurology, awaiting disability as well  4. Neuropathy Followed by neurology  5. B12 deficiency - B12 and Folate Panel  6. Vitamin D  deficiency - VITAMIN D  25 Hydroxy (Vit-D Deficiency, Fractures)  7. Abnormal thyroid  exam - TSH + free T4  8. Other fatigue - Basic Metabolic Panel (BMET) - CBC w/Diff/Platelet - TSH + free T4 - VITAMIN D  25 Hydroxy (Vit-D Deficiency, Fractures) - B12 and Folate Panel   General Counseling: Gerald [name] verbalizes understanding of the findings of todays visit and agrees with plan of treatment. I have discussed any further diagnostic evaluation that may be needed or ordered today. We also reviewed his medications today. he has been encouraged to call the office with any questions or concerns that should arise related to todays visit.    Orders Placed This Encounter  Procedures   Basic Metabolic Panel (BMET)   CBC w/Diff/Platelet   TSH + free T4   VITAMIN D  25 Hydroxy (Vit-D Deficiency, Fractures)   B12 and Folate Panel    Meds ordered this encounter  Medications   Dulaglutide  (TRULICITY ) 3 MG/0.5ML SOAJ    Sig: Inject 3 mg as directed once a week.    Dispense:  2 mL  Refill:  2   lisinopril  (ZESTRIL ) 10 MG tablet    Sig: Take 1 tablet (10 mg total) by mouth daily.    Dispense:  90 tablet    Refill:  3    This patient was seen by Tinnie Pro, PA-C in collaboration with Dr. Sigrid Bathe as a part of collaborative care agreement.   Total time spent:30 Minutes Time spent includes review  of chart, medications, test results, and follow up plan with the patient.      Dr Fozia M Khan Internal medicine

## 2023-11-28 ENCOUNTER — Other Ambulatory Visit: Payer: Self-pay | Admitting: Physician Assistant

## 2023-12-02 DIAGNOSIS — M5481 Occipital neuralgia: Secondary | ICD-10-CM | POA: Diagnosis not present

## 2023-12-02 DIAGNOSIS — R269 Unspecified abnormalities of gait and mobility: Secondary | ICD-10-CM | POA: Diagnosis not present

## 2023-12-02 DIAGNOSIS — G479 Sleep disorder, unspecified: Secondary | ICD-10-CM | POA: Diagnosis not present

## 2023-12-02 DIAGNOSIS — F4321 Adjustment disorder with depressed mood: Secondary | ICD-10-CM | POA: Diagnosis not present

## 2023-12-02 DIAGNOSIS — R52 Pain, unspecified: Secondary | ICD-10-CM | POA: Diagnosis not present

## 2023-12-02 DIAGNOSIS — I69398 Other sequelae of cerebral infarction: Secondary | ICD-10-CM | POA: Diagnosis not present

## 2023-12-02 DIAGNOSIS — I6930 Unspecified sequelae of cerebral infarction: Secondary | ICD-10-CM | POA: Diagnosis not present

## 2023-12-02 DIAGNOSIS — R2689 Other abnormalities of gait and mobility: Secondary | ICD-10-CM | POA: Diagnosis not present

## 2023-12-02 DIAGNOSIS — R200 Anesthesia of skin: Secondary | ICD-10-CM | POA: Diagnosis not present

## 2023-12-02 DIAGNOSIS — R519 Headache, unspecified: Secondary | ICD-10-CM | POA: Diagnosis not present

## 2023-12-02 DIAGNOSIS — R531 Weakness: Secondary | ICD-10-CM | POA: Diagnosis not present

## 2023-12-04 ENCOUNTER — Other Ambulatory Visit: Payer: Self-pay | Admitting: Physician Assistant

## 2023-12-04 DIAGNOSIS — G5793 Unspecified mononeuropathy of bilateral lower limbs: Secondary | ICD-10-CM

## 2023-12-05 ENCOUNTER — Other Ambulatory Visit: Payer: Self-pay | Admitting: Internal Medicine

## 2023-12-05 DIAGNOSIS — B372 Candidiasis of skin and nail: Secondary | ICD-10-CM

## 2023-12-07 NOTE — Telephone Encounter (Signed)
 Pt does not need refill if his rash ( fungal infection in his groin) is better

## 2023-12-10 DIAGNOSIS — E559 Vitamin D deficiency, unspecified: Secondary | ICD-10-CM | POA: Diagnosis not present

## 2023-12-10 DIAGNOSIS — E538 Deficiency of other specified B group vitamins: Secondary | ICD-10-CM | POA: Diagnosis not present

## 2023-12-10 DIAGNOSIS — R946 Abnormal results of thyroid function studies: Secondary | ICD-10-CM | POA: Diagnosis not present

## 2023-12-10 DIAGNOSIS — R5383 Other fatigue: Secondary | ICD-10-CM | POA: Diagnosis not present

## 2023-12-11 LAB — CBC WITH DIFFERENTIAL/PLATELET
Basophils Absolute: 0.1 x10E3/uL (ref 0.0–0.2)
Basos: 1 %
EOS (ABSOLUTE): 0.1 x10E3/uL (ref 0.0–0.4)
Eos: 1 %
Hematocrit: 48.6 % (ref 37.5–51.0)
Hemoglobin: 15.8 g/dL (ref 13.0–17.7)
Immature Grans (Abs): 0.2 x10E3/uL — ABNORMAL HIGH (ref 0.0–0.1)
Immature Granulocytes: 2 %
Lymphocytes Absolute: 3.3 x10E3/uL — ABNORMAL HIGH (ref 0.7–3.1)
Lymphs: 37 %
MCH: 29.8 pg (ref 26.6–33.0)
MCHC: 32.5 g/dL (ref 31.5–35.7)
MCV: 92 fL (ref 79–97)
Monocytes Absolute: 0.5 x10E3/uL (ref 0.1–0.9)
Monocytes: 6 %
Neutrophils Absolute: 4.7 x10E3/uL (ref 1.4–7.0)
Neutrophils: 53 %
Platelets: 262 x10E3/uL (ref 150–450)
RBC: 5.31 x10E6/uL (ref 4.14–5.80)
RDW: 14.6 % (ref 11.6–15.4)
WBC: 8.8 x10E3/uL (ref 3.4–10.8)

## 2023-12-11 LAB — BASIC METABOLIC PANEL WITH GFR
BUN/Creatinine Ratio: 12 (ref 9–20)
BUN: 13 mg/dL (ref 6–24)
CO2: 23 mmol/L (ref 20–29)
Calcium: 9.5 mg/dL (ref 8.7–10.2)
Chloride: 97 mmol/L (ref 96–106)
Creatinine, Ser: 1.07 mg/dL (ref 0.76–1.27)
Glucose: 269 mg/dL — ABNORMAL HIGH (ref 70–99)
Potassium: 4.1 mmol/L (ref 3.5–5.2)
Sodium: 139 mmol/L (ref 134–144)
eGFR: 81 mL/min/1.73 (ref >59)

## 2023-12-11 LAB — B12 AND FOLATE PANEL
Folate: 13.9 ng/mL (ref >3.0)
Vitamin B-12: 405 pg/mL (ref 232–1245)

## 2023-12-11 LAB — VITAMIN D 25 HYDROXY (VIT D DEFICIENCY, FRACTURES): Vit D, 25-Hydroxy: 21.1 ng/mL — ABNORMAL LOW (ref 30.0–100.0)

## 2023-12-11 LAB — TSH+FREE T4
Free T4: 1.06 ng/dL (ref 0.82–1.77)
TSH: 2.93 u[IU]/mL (ref 0.450–4.500)

## 2023-12-18 ENCOUNTER — Other Ambulatory Visit: Payer: Self-pay | Admitting: Internal Medicine

## 2023-12-18 DIAGNOSIS — E876 Hypokalemia: Secondary | ICD-10-CM

## 2023-12-24 ENCOUNTER — Telehealth: Payer: Self-pay | Admitting: Physician Assistant

## 2023-12-24 ENCOUNTER — Ambulatory Visit (INDEPENDENT_AMBULATORY_CARE_PROVIDER_SITE_OTHER): Admitting: Physician Assistant

## 2023-12-24 ENCOUNTER — Encounter: Payer: Self-pay | Admitting: Physician Assistant

## 2023-12-24 ENCOUNTER — Telehealth: Payer: Self-pay | Admitting: Cardiology

## 2023-12-24 VITALS — BP 119/90 | HR 87 | Temp 98.0°F | Resp 16 | Ht 74.0 in | Wt 330.0 lb

## 2023-12-24 DIAGNOSIS — G4733 Obstructive sleep apnea (adult) (pediatric): Secondary | ICD-10-CM

## 2023-12-24 DIAGNOSIS — I6930 Unspecified sequelae of cerebral infarction: Secondary | ICD-10-CM | POA: Diagnosis not present

## 2023-12-24 DIAGNOSIS — F411 Generalized anxiety disorder: Secondary | ICD-10-CM | POA: Diagnosis not present

## 2023-12-24 DIAGNOSIS — G5793 Unspecified mononeuropathy of bilateral lower limbs: Secondary | ICD-10-CM | POA: Diagnosis not present

## 2023-12-24 DIAGNOSIS — R002 Palpitations: Secondary | ICD-10-CM | POA: Diagnosis not present

## 2023-12-24 DIAGNOSIS — I10 Essential (primary) hypertension: Secondary | ICD-10-CM

## 2023-12-24 DIAGNOSIS — Z794 Long term (current) use of insulin: Secondary | ICD-10-CM | POA: Diagnosis not present

## 2023-12-24 DIAGNOSIS — E1165 Type 2 diabetes mellitus with hyperglycemia: Secondary | ICD-10-CM | POA: Diagnosis not present

## 2023-12-24 MED ORDER — TRULICITY 4.5 MG/0.5ML ~~LOC~~ SOAJ: 4.5000 mg | SUBCUTANEOUS | 2 refills | Status: DC

## 2023-12-24 MED ORDER — EMPAGLIFLOZIN 25 MG PO TABS: 25.0000 mg | ORAL_TABLET | Freq: Every day | ORAL | 5 refills | Status: AC

## 2023-12-24 MED ORDER — NOVOLOG FLEXPEN 100 UNIT/ML ~~LOC~~ SOPN: PEN_INJECTOR | SUBCUTANEOUS | 11 refills | Status: AC

## 2023-12-24 MED ORDER — CITALOPRAM HYDROBROMIDE 40 MG PO TABS: ORAL_TABLET | ORAL | 1 refills | Status: DC

## 2023-12-24 NOTE — Progress Notes (Signed)
 [facility] [address] [place], [place] [address]  Internal MEDICINE  Office Visit Note  Patient Name: Gerald [name]  [id number]  [id number]  Date of Service: 12/24/2023  Chief Complaint  Patient presents with   Follow-up    Discuss CPAP use   Hypertension   Diabetes   Medication Refill    30 days fast acting insulin  needed    HPI Pt is here for routine follow up -Went up on his fast acting to 20units briefly due to high BG, taking 30units long acting -210 avg for last 14 days -couple of falls, no injuries, no head strikes -using walker at home and for any distances -neurology on Dec 4th -HR staying around 100 now with any level of exertion--non strenuous. Will need another heart monitor and is going to call his cardiologist. -long acting go up to 34 units -Has not been able to pay for new CPAP supplies, so this is limiting CPAP use because it was leaking too much to use. He is going to check supplies online to see if more affordable. He normally reports great benefit from CPAP and wears nightly. -will need letter for disability processing, his lawyer will send fax with details. He reports neurology is also sending letter  Current Medication: Outpatient Encounter Medications as of 12/24/2023  Medication Sig   aspirin  EC 81 MG tablet Take 1 tablet (81 mg total) by mouth daily. Swallow whole.   clopidogrel  (PLAVIX ) 75 MG tablet Take 1 tablet (75 mg total) by mouth daily.   Continuous Glucose Sensor (DEXCOM G7 SENSOR) MISC Use every 10 days as directed. Dx: E11.65.   cyclobenzaprine  (FLEXERIL ) 10 MG tablet TAKE 1 TABLET (10 MG TOTAL) BY MOUTH AT BEDTIME FOR BACK SPASM AS NEEDED ONLY   Dulaglutide  (TRULICITY ) 4.5 MG/0.5ML SOAJ Inject 4.5 mg as directed once a week.   Evolocumab  (REPATHA  SURECLICK) 140 MG/ML SOAJ Inject 140 mg into the skin every 14 (fourteen) days.   furosemide  (LASIX ) 20 MG tablet TAKE 1 TABLET BY MOUTH EVERY DAY AS NEEDED FOR LEG  SWELLING   gabapentin  (NEURONTIN ) 300 MG capsule TAKE 1 CAPSULE BY MOUTH THREE TIMES A DAY   Insulin  Pen Needle (BD PEN NEEDLE NANO U/F) 32G X 4 MM MISC Use as directed once a daily   lamoTRIgine  (LAMICTAL ) 25 MG tablet Take 1 tablet (25 mg total) by mouth at bedtime for 14 days, THEN 1 tablet (25 mg total) 2 (two) times daily for 14 days.   lisinopril  (ZESTRIL ) 10 MG tablet Take 1 tablet (10 mg total) by mouth daily.   lisinopril -hydrochlorothiazide  (ZESTORETIC ) 20-25 MG tablet TAKE 1 TABLET BY MOUTH EVERY DAY   Multiple Vitamins-Minerals (MULTIVITAMIN ADULTS PO) Take 1 tablet by mouth daily.   nitroGLYCERIN  (NITROSTAT ) 0.4 MG SL tablet Place 1 tablet (0.4 mg total) under the tongue every 5 (five) minutes x 3 doses as needed for chest pain.   nortriptyline (PAMELOR) 10 MG capsule Take 10 mg by mouth at bedtime.   nystatin -triamcinolone  ointment (MYCOLOG) Apply 1 Application topically 2 (two) times daily.   potassium chloride  (KLOR-CON  M) 10 MEQ tablet TAKE 1 TABLET BY MOUTH AS NEEDED WITH FUROSEMIDE . (ONLY FOR LOW POTASSIUM)   rosuvastatin  (CRESTOR ) 5 MG tablet Take 1 tablet (5 mg total) by mouth at bedtime.   sulfamethoxazole -trimethoprim  (BACTRIM  DS) 800-160 MG tablet TAKE 1 TABLET BY MOUTH EVERY 12 HOURS   Vibegron  (GEMTESA ) 75 MG TABS Take 1 tablet (75 mg total) by mouth daily.   [DISCONTINUED] citalopram  (  CELEXA ) 40 MG tablet TAKE 1 TABLET BY MOUTH EVERY DAY   [DISCONTINUED] Dulaglutide  (TRULICITY ) 3 MG/0.5ML SOAJ Inject 3 mg as directed once a week.   [DISCONTINUED] empagliflozin  (JARDIANCE ) 25 MG TABS tablet Take 1 tablet (25 mg total) by mouth daily before breakfast.   [DISCONTINUED] insulin  aspart (NOVOLOG  FLEXPEN) 100 UNIT/ML FlexPen Inject with meals per sliding scale. Max 14 units   [DISCONTINUED] insulin  degludec (TRESIBA  FLEXTOUCH) 100 UNIT/ML FlexTouch Pen Inject 30 units in am sq   [DISCONTINUED] metoprolol  tartrate (LOPRESSOR ) 100 MG tablet TAKE 1 TABLET 2 HR PRIOR TO CARDIAC  PROCEDURE (Patient not taking: Reported on 12/25/2023)   [DISCONTINUED] terbinafine  (LAMISIL ) 250 MG tablet Take one tab a day for skin fungal infection   citalopram  (CELEXA ) 40 MG tablet TAKE 1 TABLET BY MOUTH EVERY DAY   empagliflozin  (JARDIANCE ) 25 MG TABS tablet Take 1 tablet (25 mg total) by mouth daily before breakfast.   insulin  aspart (NOVOLOG  FLEXPEN) 100 UNIT/ML FlexPen Inject with meals per sliding scale. Max 14 units   insulin  degludec (TRESIBA  FLEXTOUCH) 100 UNIT/ML FlexTouch Pen Inject 34 units in am sq   No facility-administered encounter medications on file as of 12/24/2023.    Surgical History: Past Surgical History:  Procedure Laterality Date   BONE EXCISION Right 11/21/2020   Procedure: PART EXCISION BONE- PHALANX;  Surgeon: Ashley Soulier, DPM;  Location: [facility];  Service: Podiatry;  Laterality: Right;   CARDIAC CATHETERIZATION N/A 09/2011   [facility]; EF 50% with 30% mid LAD stenosis and no obstructive disease.   CARDIAC CATHETERIZATION  09/2010   [facility]; Mid LAD 40% stenosis; Mid Circumflex:Normal; Mid RCA; Normal   CARDIAC CATHETERIZATION     COLONOSCOPY     COLONOSCOPY WITH PROPOFOL  N/A 04/10/2021   Procedure: COLONOSCOPY WITH PROPOFOL ;  Surgeon: Therisa Bi, MD;  Location: [facility];  Service: Gastroenterology;  Laterality: N/A;   ESOPHAGOGASTRODUODENOSCOPY N/A 04/10/2021   Procedure: ESOPHAGOGASTRODUODENOSCOPY (EGD);  Surgeon: Therisa Bi, MD;  Location: [facility] ENDOSCOPY;  Service: Gastroenterology;  Laterality: N/A;   ESOPHAGOGASTRODUODENOSCOPY (EGD) WITH PROPOFOL  N/A 06/10/2017   Procedure: ESOPHAGOGASTRODUODENOSCOPY (EGD) WITH PROPOFOL ;  Surgeon: Therisa Bi, MD;  Location: [facility] END[facility]COPY;  Service: Gastroenterology;  Laterality: N/A;   ESOPHAGOGASTRODUODENOSCOPY (EGD) WITH PROPOFOL  N/A 01/01/2022   Procedure: ESOPHAGOGASTRODUODENOSCOPY (EGD) WITH PROPOFOL ;  Surgeon: Therisa Bi, MD;  Location: [facility] ENDOSCOPY;  Service: Gastroenterology;  Laterality: N/A;    ESOPHAGOGASTRODUODENOSCOPY (EGD) WITH PROPOFOL  N/A 01/06/2022   Procedure: ESOPHAGOGASTRODUODENOSCOPY (EGD) WITH PROPOFOL ;  Surgeon: Therisa Bi, MD;  Location: [facility] END[facility]COPY;  Service: Gastroenterology;  Laterality: N/A;   HAMMER TOE SURGERY Right 11/21/2020   Procedure: HAMMERTOE CORRECTION;  Surgeon: Ashley Soulier, DPM;  Location: [facility];  Service: Podiatry;  Laterality: Right;   INSERTION OF MESH  04/16/2021   Procedure: INSERTION OF MESH;  Surgeon: Desiderio Schanz, MD;  Location: [facility] ORS;  Service: General;;   KNEE ARTHROSCOPY WITH MEDIAL MENISECTOMY Right 09/16/2012   Procedure: RIGHT KNEE ARTHROSCOPY WITH MEDIAL AND LATERAL MENISECTOMY, CHONDROPLASTY;  Surgeon: Toribio JULIANNA Chancy, MD;  Location: [facility];  Service: Orthopedics;  Laterality: Right;  RIGHT KNEE SCOPE MEDIAL MENISCECTOMY   LEFT HEART CATH AND CORONARY ANGIOGRAPHY N/A 06/11/2016   Procedure: Left Heart Cath and Coronary Angiography;  Surgeon: Perla Evalene PARAS, MD;  Location: [facility] INVASIVE CV LAB;  Service: Cardiovascular;  Laterality: N/A;   UMBILICAL HERNIA REPAIR N/A 04/16/2021   Procedure: HERNIA REPAIR UMBILICAL ADULT, open;  Surgeon: Desiderio Schanz, MD;  Location: [facility] ORS;  Service: General;  Laterality: N/A;    Medical History: Past Medical History:  Diagnosis Date   Abnormal LFTs    Anxiety    Atypical chest pain    Borderline diabetes    CAD (coronary artery disease)    a.) PCI and stent placement (unknown type) to mLAD and mLCx; date unknown. b.) LHC 09/10/2010: 40% mLAD; no intervention. c.) LHC 09/12/2011: EF 60%; 30% ISR mLAD, 40% dLAD, 30% pLCx, 30% ISR mLCx, 40% mRCA; no intervention. d.) LHC 05/23/2014: EF >55%; 30% p-mLAD; no intervention. e.) LHC 06/11/2016: EF 55-65%; LVEDP norm; 30-40% p-mLAD; no intervention.   Carcinoma (HCC)    CHF (congestive heart failure) (HCC)    Chronic pain syndrome    Depression    Diabetes mellitus without complication (HCC)    Fundic gland polyps of  stomach, benign    Gastritis    GERD (gastroesophageal reflux disease)    Hepatic steatosis    Hyperlipidemia    Hypertension    Migraines    Myocardial infarction [facility]) 2008   was told by PCP   Nonischemic cardiomyopathy (HCC)    a.) TTE 09/12/2011: EF 35-45%. b.) LHC 09/12/2011: EF 60%. c.) TTE 06/04/2012: EF 55-60%. d.) LHC 05/23/2014: EF >55%. e.) TTE 06/11/2016: EF 55-60%; G1DD. f.) LHC 06/11/2016: EF 55-65%. g.) TTE 01/08/2018: EF 60-65%.   Obesity    Occlusion and stenosis of bilateral carotid arteries    OSA on CPAP    Osteoarthritis of both knees    Shortness of breath    Squamous cell carcinoma of skin 02/27/2020   left distal lat deltoid - [facility]   Stroke (HCC)    10/24, 3/25    Family History: Family History  Problem Relation Age of Onset   Heart disease Father 75       MI   Heart attack Father    Stomach cancer Father    Hypertension Mother    Breast cancer Mother     Social History   Socioeconomic History   Marital status: Married    Spouse name: Not on file   Number of children: 3   Years of education: Not on file   Highest education level: Not on file  Occupational History   Occupation: Camera Operator  Tobacco Use   Smoking status: Never    Passive exposure: Past   Smokeless tobacco: Former    Types: Chew    Quit date: 1987  Vaping Use   Vaping status: Never Used  Substance and Sexual Activity   Alcohol use: Not Currently   Drug use: No   Sexual activity: Yes  Other Topics Concern   Not on file  Social History Narrative   Married.  76 yo son.   Wife on disability for bipolar disorder.     Social Drivers of Corporate Investment Banker Strain: Not on file  Food Insecurity: Food Insecurity Present (04/17/2023)   Hunger Vital Sign    Worried About Running Out of Food in the Last Year: Sometimes true    Ran Out of Food in the Last Year: Sometimes true  Transportation Needs: No Transportation Needs (04/17/2023)   PRAPARE - Therapist, Art (Medical): No    Lack of Transportation (Non-Medical): No  Physical Activity: Not on file  Stress: Not on file  Social Connections: Not on file  Intimate Partner Violence: Not At Risk (04/17/2023)   Humiliation, Afraid, Rape, and Kick questionnaire    Fear of Current or Ex-Partner: No  Emotionally Abused: No    Physically Abused: No    Sexually Abused: No      Review of Systems  Constitutional:  Positive for fatigue. Negative for chills and unexpected weight change.  HENT:  Negative for congestion, postnasal drip, rhinorrhea, sneezing and sore throat.   Eyes:  Negative for redness.  Respiratory:  Negative for cough, chest tightness and shortness of breath.   Cardiovascular:  Negative for chest pain and palpitations.  Gastrointestinal:  Negative for anal bleeding, constipation, diarrhea, nausea and vomiting.  Genitourinary:  Negative for dysuria.  Musculoskeletal:  Positive for arthralgias, back pain, gait problem and myalgias. Negative for joint swelling and neck pain.  Skin:  Negative for rash.  Neurological:  Positive for weakness, numbness and headaches.  Hematological:  Negative for adenopathy. Does not bruise/bleed easily.  Psychiatric/Behavioral:  Positive for behavioral problems (Depression) and sleep disturbance. Negative for self-injury and suicidal ideas. The patient is nervous/anxious.     Vital Signs: BP (!) 119/90   Pulse 87   Temp 98 F (36.7 C)   Resp 16   Ht 6' 2 (1.88 m)   Wt (!) 330 lb (149.7 kg)   SpO2 96%   BMI 42.37 kg/m    Physical Exam Vitals and nursing note reviewed.  Constitutional:      Appearance: Normal appearance.  HENT:     Head: Normocephalic and atraumatic.  Eyes:     Extraocular Movements: Extraocular movements intact.  Cardiovascular:     Rate and Rhythm: Normal rate and regular rhythm.     Pulses: Normal pulses.     Heart sounds: Normal heart sounds.  Pulmonary:     Effort: Pulmonary effort is  normal.     Breath sounds: Normal breath sounds.  Skin:    General: Skin is warm and dry.  Neurological:     General: No focal deficit present.     Mental Status: He is alert.     Gait: Gait abnormal.     Comments: Walking with cane  Psychiatric:        Mood and Affect: Mood normal.        Behavior: Behavior normal.        Assessment/Plan: 1. Type 2 diabetes mellitus with hyperglycemia, with long-term current use of insulin  (HCC) (Primary) Will increase trulicity  and raise long acting insulin  to 34units. May need to continue to titrate up on long acting - Dulaglutide  (TRULICITY ) 4.5 MG/0.5ML SOAJ; Inject 4.5 mg as directed once a week.  Dispense: 2 mL; Refill: 2 - empagliflozin  (JARDIANCE ) 25 MG TABS tablet; Take 1 tablet (25 mg total) by mouth daily before breakfast.  Dispense: 30 tablet; Refill: 5 - insulin  aspart (NOVOLOG  FLEXPEN) 100 UNIT/ML FlexPen; Inject with meals per sliding scale. Max 14 units  Dispense: 15 mL; Refill: 11  2. Essential hypertension Borderline in office. May end up on BB for palpitations which could help BP as well. Will hold off on changes until patient sees cardiology. Pt will monitor and call if any concerns  3. Palpitations None in office today, but pt reports palpitations and HR over 100 at home regularly recently and will call cardiology for follow up. Likely will need another heart monitor and start BB. Advised to call back if unable to see cardiology soon.  4. Generalized anxiety disorder - citalopram  (CELEXA ) 40 MG tablet; TAKE 1 TABLET BY MOUTH EVERY DAY  Dispense: 90 tablet; Refill: 1  5. Neuropathy involving both lower extremities Followed by neurology  6. History  of stroke with current residual effects Followed by neurology  7. OSA (obstructive sleep apnea) Discussed need for increased compliance, however pt limited due to inability to afford supplies. Will look online for cheaper options at this time and restart nightly use.  General  Counseling: talyn dessert understanding of the findings of todays visit and agrees with plan of treatment. I have discussed any further diagnostic evaluation that may be needed or ordered today. We also reviewed his medications today. he has been encouraged to call the office with any questions or concerns that should arise related to todays visit.    No orders of the defined types were placed in this encounter.   Meds ordered this encounter  Medications   Dulaglutide  (TRULICITY ) 4.5 MG/0.5ML SOAJ    Sig: Inject 4.5 mg as directed once a week.    Dispense:  2 mL    Refill:  2   empagliflozin  (JARDIANCE ) 25 MG TABS tablet    Sig: Take 1 tablet (25 mg total) by mouth daily before breakfast.    Dispense:  30 tablet    Refill:  5    Fill new script today, dx code E11.65   citalopram  (CELEXA ) 40 MG tablet    Sig: TAKE 1 TABLET BY MOUTH EVERY DAY    Dispense:  90 tablet    Refill:  1   insulin  aspart (NOVOLOG  FLEXPEN) 100 UNIT/ML FlexPen    Sig: Inject with meals per sliding scale. Max 14 units    Dispense:  15 mL    Refill:  11   insulin  degludec (TRESIBA  FLEXTOUCH) 100 UNIT/ML FlexTouch Pen    Sig: Inject 34 units in am sq    This patient was seen by Tinnie Pro, PA-C in collaboration with Dr. Sigrid Bathe as a part of collaborative care agreement.   Total time spent:35 Minutes Time spent includes review of chart, medications, test results, and follow up plan with the patient.      Dr Fozia M Khan Internal medicine

## 2023-12-24 NOTE — Telephone Encounter (Signed)
 Pt c/o BP issue: STAT if pt c/o blurred vision, one-sided weakness or slurred speech.  STAT if BP is GREATER than 180/120 TODAY.  STAT if BP is LESS than 90/60 and SYMPTOMATIC TODAY  1. What is your BP concern? Hypertension   2. Have you taken any BP medication today? yes  3. What are your last 5 BP readings?161/90 HR 98, 118/90 HR 93  4. Are you having any other symptoms (ex. Dizziness, headache, blurred vision, passed out)? Headaches, lightheaded   Pt would like a c/b from a nurse to discuss further

## 2023-12-24 NOTE — Telephone Encounter (Signed)
 Patient called today expressing concerns regarding his heart rate. He noted that with minimal exertion, or even at rest, his HR increases to 95-105. He stated that he feels as though he has run a mile and begins to sweat and become clammy. He also reported fluctuations in his blood pressure ranging from 133/98 to 167/105. He reports frequent headaches, but noted these are not new and have been present since his stroke.  Patient denies any symptoms currently. Appointment scheduled for tomorrow, 12/25/23, for further evaluation.

## 2023-12-24 NOTE — Telephone Encounter (Signed)
 Received request from [employer] for letter. Gave to [employer]

## 2023-12-25 ENCOUNTER — Encounter: Payer: Self-pay | Admitting: Physician Assistant

## 2023-12-25 ENCOUNTER — Ambulatory Visit: Attending: Physician Assistant | Admitting: Physician Assistant

## 2023-12-25 ENCOUNTER — Ambulatory Visit

## 2023-12-25 VITALS — BP 132/90 | HR 80 | Ht 74.0 in | Wt 333.0 lb

## 2023-12-25 DIAGNOSIS — I2510 Atherosclerotic heart disease of native coronary artery without angina pectoris: Secondary | ICD-10-CM

## 2023-12-25 DIAGNOSIS — I10 Essential (primary) hypertension: Secondary | ICD-10-CM

## 2023-12-25 DIAGNOSIS — E785 Hyperlipidemia, unspecified: Secondary | ICD-10-CM | POA: Diagnosis not present

## 2023-12-25 DIAGNOSIS — R002 Palpitations: Secondary | ICD-10-CM

## 2023-12-25 DIAGNOSIS — R0602 Shortness of breath: Secondary | ICD-10-CM | POA: Diagnosis not present

## 2023-12-25 DIAGNOSIS — I493 Ventricular premature depolarization: Secondary | ICD-10-CM

## 2023-12-25 DIAGNOSIS — I639 Cerebral infarction, unspecified: Secondary | ICD-10-CM

## 2023-12-25 MED ORDER — METOPROLOL SUCCINATE ER 25 MG PO TB24: 25.0000 mg | ORAL_TABLET | Freq: Every day | ORAL | 3 refills | Status: DC

## 2023-12-25 NOTE — Patient Instructions (Addendum)
 Medication Instructions:  Your physician recommends the following medication changes.  START TAKING: Toprol -XL (metoprolol  succinate) 25 mg daily  *If you need a refill on your cardiac medications before your next appointment, please call your pharmacy*  Lab Work: None ordered at this time   Testing/Procedures: Your physician has requested that you have an echocardiogram. Echocardiography is a painless test that uses sound waves to create images of your heart. It provides your doctor with information about the size and shape of your heart and how well your heart's chambers and valves are working.   You may receive an ultrasound enhancing agent through an IV if needed to better visualize your heart during the echo. This procedure takes approximately one hour.  There are no restrictions for this procedure.  This will take place at [address] [facility]) [address], [place] [address]  Please note: We ask at that you not bring children with you during ultrasound (echo/ vascular) testing. Due to room size and safety concerns, children are not allowed in the ultrasound rooms during exams. Our front office staff cannot provide observation of children in our lobby area while testing is being conducted. An adult accompanying a patient to their appointment will only be allowed in the ultrasound room at the discretion of the ultrasound technician under special circumstances. We apologize for any inconvenience.  ZIO XT- Long Term Monitor Instructions  Your physician has requested you wear a ZIO patch monitor for 7 days.  This is a single patch monitor. [employer] supplies one patch monitor per enrollment. Additional stickers are not available. Please do not apply patch if you will be having a Nuclear Stress Test, Echocardiogram, Cardiac CT, MRI, or Chest Xray during the period you would be wearing the monitor. The patch cannot be worn during these tests. You cannot remove and re-apply the ZIO  XT patch monitor.  Your ZIO patch monitor will be mailed 3 day USPS to your address on file. It may take 3-5 days to receive your monitor after you have been enrolled. Once you have received your monitor, please review the enclosed instructions. Your monitor has already been registered assigning a specific monitor serial number to you.  Billing and Patient Assistance Program Information  We have supplied [employer] with any of your insurance information on file for billing purposes.  [employer] offers a sliding scale Patient Assistance Program for patients that do not have insurance, or whose insurance does not completely cover the cost of the ZIO monitor.  You must apply for the Patient Assistance Program to qualify for this discounted rate.  To apply, please call [employer] at [phone], select option 4, select option 2, ask to apply for Patient Assistance Program. Meredeth will ask your household income, and how many people are in your household. They will quote your out-of-pocket cost based on that information. [employer] will also be able to set up a 61-month, interest-free payment plan if needed.  Applying the monitor   Shave hair from upper left chest.  Hold abrader disc by orange tab. Rub abrader in 40 strokes over the upper left chest as indicated in your monitor instructions.  Clean area with 4 enclosed alcohol pads. Let dry.  Apply patch as indicated in monitor instructions. Patch will be placed under collarbone on left side of chest with arrow pointing upward.  Rub patch adhesive wings for 2 minutes. Remove white label marked 1. Remove the white label marked 2. Rub patch adhesive wings for 2 additional minutes.  While  looking in a mirror, press and release button in center of patch. A small green light will flash 3-4 times. This will be your only indicator that the monitor has been turned on.  Do not shower for the first 24 hours. You may shower after the first 24 hours.  Press the button if  you feel a symptom. You will hear a small click. Record Date, Time and Symptom in the Patient Logbook.  When you are ready to remove the patch, follow instructions on the last 2 pages of Patient Logbook.  Stick patch monitor into the tabs at the bottom of the return box.  Place Patient Logbook in the blue and white box. Use locking tab on box and tape box closed securely. The blue and white box has prepaid postage on it. Please place it in the mailbox as soon as possible. Your physician should have your test results approximately 7-14 days after the monitor has been mailed back to [facility].  Call [facility] Customer Care at [phone] if you have questions regarding your ZIO XT patch monitor.  Call them immediately if you see an orange light blinking on your monitor.  If your monitor falls off in less than 4 days, contact our Monitor department at [phone].  If your monitor becomes loose or falls off after 4 days call [employer] at [phone] for suggestions on securing your monitor.   Follow-Up: At [facility], you and your health needs are our priority.  As part of our continuing mission to provide you with exceptional heart care, our providers are all part of one team.  This team includes your primary Cardiologist (physician) and Advanced Practice Providers or APPs (Physician Assistants and Nurse Practitioners) who all work together to provide you with the care you need, when you need it.  Your next appointment:   2 month(s)  Provider:   You may see Redell Cave, MD or Lesley Maffucci, PA-C  We recommend signing up for the patient portal called MyChart.  Sign up information is provided on this After Visit Summary.  MyChart is used to connect with patients for Virtual Visits (Telemedicine).  Patients are able to view lab/test results, encounter notes, upcoming appointments, etc.  Non-urgent messages can be sent to your provider as well.   To learn more about what  you can do with MyChart, go to [email or web address].   Blood Pressure log given

## 2023-12-25 NOTE — Progress Notes (Signed)
 Cardiology Office Note    Date:  12/25/2023   ID:  Gerald [name], DOB [birth year], MRN [id number]  PCP:  Kristina Tinnie POUR, PA-C  Cardiologist:  Redell Cave, MD  Electrophysiologist:  None   Chief Complaint: Heart racing  History of Present Illness:   Gerald [name] is a 59 y.o. male with history of nonobstructive CAD, hyperlipidemia, CVA x 2 (11/2022, 04/2023) who presents for evaluation of heart racing and elevated BP.    Patient initially followed with Dr. Deretha and had cardiac catheterization done in August 2012 in August 2013 with atypical chest pain.  Both showed nonobstructive disease.  He reportedly had reduced ejection fraction with EF 35 to 40% in 2013.  By cardiac cath, ejection fraction was 50%.  Repeat cardiac catheterization in 2016 again showed nonobstructive CAD with 30% LAD stenosis.  He had a cath again in 2018 which showed stable 30% mid LAD stenosis with normal EF.  Patient reestablished with our practice 04/2022 and was seen by Dr. Cave.  Echo 04/2022 showed normal LV systolic function with no significant valvular abnormalities.  Lexiscan  Myoview  05/2022 was overall low risk with no evidence of ischemia or infarction.  Echo 11/2022 showed EF 50 to 55% with G1 DD and trivial MR.  Coronary CTA 05/2023 showed calcium  score of 23.3 which was 58th percentile for age and sex matched control.  Minimal mid LAD stenosis was noted (less than 25%).  ZIO monitor ordered due to CVA 05/2023 revealed predominantly sinus rhythm with rare PACs and frequent PVCs at 5.4% burden.  No evidence of atrial fibrillation or flutter and no sustained arrhythmias.   Patient was most recently seen in our office by Dr. Cave 07/29/2023 overall doing well from a cardiac perspective.  Left chest was noted to be tender on palpation suggesting musculoskeletal etiology of chest discomfort.  He was noted to be intolerant to higher doses of statins and was continued on Crestor  5 mg  daily.  No further testing or medication changes were indicated at that time.  Patient presents today with complaints of approximately 1 month history of episodes of feelings of heart racing associated with shortness of breath and breaking into a sweat. This occurs with minimal activity such as walking a short distance or even feeding himself.  These episodes occur several times per week and he feels exhausted after. He reports taking his vital signs during these episodes and noting elevated blood pressure and heart rate.  He notes occasional elevated heart rate even at rest in the 105-110s bpm.  He had 1 episode during which his chest felt tight and blood pressure was very elevated at that time.  He did take a sublingual nitroglycerin  at that time with improvement in blood pressure and chest discomfort.  He denies exertional chest pain.  He is very limited in his activity due to left-sided weakness from a stroke.  No lower extremity swelling, orthopnea, or PND.  Labs independently reviewed: 12/2022-Hgb 15.8, HCT 48.6, platelets 262, BUN 13, creatinine 1.07, sodium 139, potassium 4.1 10/2023-normal LFTs 05/2023-TC 155, TG 222, HDL 35, LDL 76  Objective   Past Medical History:  Diagnosis Date   Abnormal LFTs    Anxiety    Atypical chest pain    Borderline diabetes    CAD (coronary artery disease)    a.) PCI and stent placement (unknown type) to mLAD and mLCx; date unknown. b.) LHC 09/10/2010: 40% mLAD; no intervention. c.) LHC 09/12/2011: EF 60%; 30% ISR mLAD, 40%  dLAD, 30% pLCx, 30% ISR mLCx, 40% mRCA; no intervention. d.) LHC 05/23/2014: EF >55%; 30% p-mLAD; no intervention. e.) LHC 06/11/2016: EF 55-65%; LVEDP norm; 30-40% p-mLAD; no intervention.   Carcinoma (HCC)    CHF (congestive heart failure) (HCC)    Chronic pain syndrome    Depression    Diabetes mellitus without complication (HCC)    Fundic gland polyps of stomach, benign    Gastritis    GERD (gastroesophageal reflux disease)     Hepatic steatosis    Hyperlipidemia    Hypertension    Migraines    Myocardial infarction [facility]) 2008   was told by PCP   Nonischemic cardiomyopathy (HCC)    a.) TTE 09/12/2011: EF 35-45%. b.) LHC 09/12/2011: EF 60%. c.) TTE 06/04/2012: EF 55-60%. d.) LHC 05/23/2014: EF >55%. e.) TTE 06/11/2016: EF 55-60%; G1DD. f.) LHC 06/11/2016: EF 55-65%. g.) TTE 01/08/2018: EF 60-65%.   Obesity    Occlusion and stenosis of bilateral carotid arteries    OSA on CPAP    Osteoarthritis of both knees    Shortness of breath    Squamous cell carcinoma of skin 02/27/2020   left distal lat deltoid - EDC   Stroke (HCC)    10/24, 3/25    Current Medications: Current Meds  Medication Sig   aspirin  EC 81 MG tablet Take 1 tablet (81 mg total) by mouth daily. Swallow whole.   citalopram  (CELEXA ) 40 MG tablet TAKE 1 TABLET BY MOUTH EVERY DAY   clopidogrel  (PLAVIX ) 75 MG tablet Take 1 tablet (75 mg total) by mouth daily.   Continuous Glucose Sensor (DEXCOM G7 SENSOR) MISC Use every 10 days as directed. Dx: E11.65.   cyclobenzaprine  (FLEXERIL ) 10 MG tablet TAKE 1 TABLET (10 MG TOTAL) BY MOUTH AT BEDTIME FOR BACK SPASM AS NEEDED ONLY   Dulaglutide  (TRULICITY ) 4.5 MG/0.5ML SOAJ Inject 4.5 mg as directed once a week.   empagliflozin  (JARDIANCE ) 25 MG TABS tablet Take 1 tablet (25 mg total) by mouth daily before breakfast.   Evolocumab  (REPATHA  SURECLICK) 140 MG/ML SOAJ Inject 140 mg into the skin every 14 (fourteen) days.   furosemide  (LASIX ) 20 MG tablet TAKE 1 TABLET BY MOUTH EVERY DAY AS NEEDED FOR LEG SWELLING   gabapentin  (NEURONTIN ) 300 MG capsule TAKE 1 CAPSULE BY MOUTH THREE TIMES A DAY   insulin  aspart (NOVOLOG  FLEXPEN) 100 UNIT/ML FlexPen Inject with meals per sliding scale. Max 14 units   insulin  degludec (TRESIBA  FLEXTOUCH) 100 UNIT/ML FlexTouch Pen Inject 30 units in am sq   Insulin  Pen Needle (BD PEN NEEDLE NANO U/F) 32G X 4 MM MISC Use as directed once a daily   lamoTRIgine  (LAMICTAL ) 25 MG tablet  Take 1 tablet (25 mg total) by mouth at bedtime for 14 days, THEN 1 tablet (25 mg total) 2 (two) times daily for 14 days.   lisinopril  (ZESTRIL ) 10 MG tablet Take 1 tablet (10 mg total) by mouth daily.   lisinopril -hydrochlorothiazide  (ZESTORETIC ) 20-25 MG tablet TAKE 1 TABLET BY MOUTH EVERY DAY   metoprolol  succinate (TOPROL  XL) 25 MG 24 hr tablet Take 1 tablet (25 mg total) by mouth daily.   Multiple Vitamins-Minerals (MULTIVITAMIN ADULTS PO) Take 1 tablet by mouth daily.   nitroGLYCERIN  (NITROSTAT ) 0.4 MG SL tablet Place 1 tablet (0.4 mg total) under the tongue every 5 (five) minutes x 3 doses as needed for chest pain.   nortriptyline (PAMELOR) 10 MG capsule Take 10 mg by mouth at bedtime.   nystatin -triamcinolone  ointment (MYCOLOG) Apply 1  Application topically 2 (two) times daily.   potassium chloride  (KLOR-CON  M) 10 MEQ tablet TAKE 1 TABLET BY MOUTH AS NEEDED WITH FUROSEMIDE . (ONLY FOR LOW POTASSIUM)   rosuvastatin  (CRESTOR ) 5 MG tablet Take 1 tablet (5 mg total) by mouth at bedtime.   sulfamethoxazole -trimethoprim  (BACTRIM  DS) 800-160 MG tablet TAKE 1 TABLET BY MOUTH EVERY 12 HOURS   Vibegron  (GEMTESA ) 75 MG TABS Take 1 tablet (75 mg total) by mouth daily.    Allergies:   Nexlizet  [bempedoic acid-ezetimibe ] and Statins   Social History   Socioeconomic History   Marital status: Married    Spouse name: Not on file   Number of children: 3   Years of education: Not on file   Highest education level: Not on file  Occupational History   Occupation: Camera Operator  Tobacco Use   Smoking status: Never    Passive exposure: Past   Smokeless tobacco: Former    Types: Chew    Quit date: 1987  Vaping Use   Vaping status: Never Used  Substance and Sexual Activity   Alcohol use: Not Currently   Drug use: No   Sexual activity: Yes  Other Topics Concern   Not on file  Social History Narrative   Married.  71 yo son.   Wife on disability for bipolar disorder.     Social Drivers of  Corporate Investment Banker Strain: Not on file  Food Insecurity: Food Insecurity Present (04/17/2023)   Hunger Vital Sign    Worried About Running Out of Food in the Last Year: Sometimes true    Ran Out of Food in the Last Year: Sometimes true  Transportation Needs: No Transportation Needs (04/17/2023)   PRAPARE - Administrator, Civil Service (Medical): No    Lack of Transportation (Non-Medical): No  Physical Activity: Not on file  Stress: Not on file  Social Connections: Not on file     Family History:  The patient's family history includes Breast cancer in his mother; Heart attack in his father; Heart disease (age of onset: 67) in his father; Hypertension in his mother; Stomach cancer in his father.  ROS:   12-point review of systems is negative unless otherwise noted in the HPI.  EKGs/Other Studies Reviewed:    Studies reviewed were summarized above. The additional studies were reviewed today:  05/2023 Long term monitor Average heart rate 80 bpm, range 59-129. Frequent PVCs, 5.4% burden. No atrial fibrillation or atrial flutter. No sustained arrhythmias.  05/2023 Coronary CTA 1. Coronary calcium  score of 23.3. This was 58th percentile for age and sex matched control. 2. Normal coronary origin with right dominance. 3. Minimal mid LAD stenosis (<25%). 4. CAD-RADS 1. Minimal non-obstructive CAD (0-24%). Consider non-atherosclerotic causes of chest pain. Consider preventive therapy and risk factor modification.  11/2022 Echo complete 1. Left ventricular ejection fraction, by estimation, is 50 to 55%. The left ventricle has low normal function. The left ventricle has no regional wall motion abnormalities. There is moderate left ventricular hypertrophy. Left ventricular diastolic parameters are consistent with Grade I diastolic dysfunction (impaired relaxation).   2. Right ventricular systolic function is normal. The right ventricular size is normal.   3. The mitral  valve is normal in structure. Trivial mitral valve regurgitation. No evidence of mitral stenosis.   4. The aortic valve is tricuspid. Aortic valve regurgitation is not visualized. No aortic stenosis is present.   EKG:  EKG personally reviewed by me today EKG Interpretation Date/Time:  Friday  December 25 2023 09:45:54 EST Ventricular Rate:  80 PR Interval:    QRS Duration:  82 QT Interval:  388 QTC Calculation: 447 R Axis:   73  Text Interpretation: Sinus rhythm with first degree AV block and occasional PVCs Septal infarct (cited on or before 29-Jul-2023) Confirmed by Lorene Sinclair ([id number]) on 12/25/2023 9:48:46 AM  PHYSICAL EXAM:    VS:  BP (!) 132/90   Pulse 80   Ht 6' 2 (1.88 m)   Wt (!) 333 lb (151 kg)   SpO2 97%   BMI 42.75 kg/m   BMI: Body mass index is 42.75 kg/m.  GEN: Well nourished, well developed in no acute distress NECK: No JVD; No carotid bruits CARDIAC: RRR, no murmurs, rubs, gallops RESPIRATORY:  Clear to auscultation without rales, wheezing or rhonchi  ABDOMEN: Soft, non-tender, non-distended EXTREMITIES: No edema; No deformity  Wt Readings from Last 3 Encounters:  12/25/23 (!) 333 lb (151 kg)  12/24/23 (!) 330 lb (149.7 kg)  11/27/23 (!) 330 lb (149.7 kg)                  ASSESSMENT & PLAN:   Palpitations Shortness of breath - Patient reports episodes of heart racing with associated shortness of breath and breaking into a sweat followed by fatigue. Reports increased BP and HR with these episodes. One of which had accompanying chest tightness. He had a recent coronary CTA in April which showed minimal nonobstructive CAD which was stable since prior cath. Recommend obtaining echo and Zio XT 7 days. Start metoprolol  succinate 25 mg daily.   PVCs - Recent ZIO monitor 05/2023 which showed predominantly sinus rhythm with frequent PVCs, 5.4% burden.  Recommend starting metoprolol  and obtaining echo as above.  Coronary artery disease - Recent coronary CTA  05/2023 minimal nonobstructive CAD (0 to 24%).  No symptoms of exertional angina or cardiac decompensation.  He is continued on aspirin  81 mg daily, Repatha , and rosuvastatin  5 mg daily.   CVA x2 - ZIO monitor 05/2023 without evidence of atrial fibrillation or flutter.  He is continued on aspirin , Plavix , Repatha , and rosuvastatin .  Hypertension - BP mildly elevated today.  He is continued on lisinopril  10 mg daily.  Start metoprolol  as above.  BP log provided.  Hyperlipidemia - Most recent lipid panel 05/2023 with LDL 76.  He is continued on Repatha  and rosuvastatin  as above.    Disposition: Obtain echo and ZIO XT x 7 days.  Start metoprolol  succinate 25 mg daily.  Keep BP log and bring to follow-up.  F/u with Dr. Darliss or an APP in 2 months.   Medication Adjustments/Labs and Tests Ordered: Current medicines are reviewed at length with the patient today.  Concerns regarding medicines are outlined above. Medication changes, Labs and Tests ordered today are summarized above and listed in the Patient Instructions accessible in Encounters.   Bonney Sinclair Lorene, PA-C 12/25/2023 11:31 AM     [facility] [place] [address] [place], [place] [address] [phone]

## 2023-12-27 MED ORDER — TRESIBA FLEXTOUCH 100 UNIT/ML ~~LOC~~ SOPN: PEN_INJECTOR | SUBCUTANEOUS | Status: DC

## 2023-12-28 ENCOUNTER — Telehealth: Payer: Self-pay | Admitting: Cardiology

## 2023-12-28 ENCOUNTER — Telehealth: Payer: Self-pay | Admitting: Physician Assistant

## 2023-12-28 NOTE — Telephone Encounter (Signed)
 Called and spoke with patient. Answered questions regarding new medication. Patient verbalizes understanding and appreciation for the call.

## 2023-12-28 NOTE — Telephone Encounter (Signed)
 Patient calling in to see how he should take his medication. Please advise

## 2023-12-28 NOTE — Telephone Encounter (Signed)
 Letter to [employer] done. Faxed to them; [phone]. Scanned. Patient to p/u @ front desk-Toni

## 2024-01-05 ENCOUNTER — Telehealth: Payer: Self-pay | Admitting: Physician Assistant

## 2024-01-05 NOTE — Telephone Encounter (Addendum)
 Received MR request from [facility]  08/04/23-Current faxed to [facility]; [phone]

## 2024-01-07 DIAGNOSIS — R519 Headache, unspecified: Secondary | ICD-10-CM | POA: Diagnosis not present

## 2024-01-07 DIAGNOSIS — R200 Anesthesia of skin: Secondary | ICD-10-CM | POA: Diagnosis not present

## 2024-01-07 DIAGNOSIS — F4321 Adjustment disorder with depressed mood: Secondary | ICD-10-CM | POA: Diagnosis not present

## 2024-01-07 DIAGNOSIS — G479 Sleep disorder, unspecified: Secondary | ICD-10-CM | POA: Diagnosis not present

## 2024-01-07 DIAGNOSIS — I6930 Unspecified sequelae of cerebral infarction: Secondary | ICD-10-CM | POA: Diagnosis not present

## 2024-01-07 DIAGNOSIS — R269 Unspecified abnormalities of gait and mobility: Secondary | ICD-10-CM | POA: Diagnosis not present

## 2024-01-07 DIAGNOSIS — R2689 Other abnormalities of gait and mobility: Secondary | ICD-10-CM | POA: Diagnosis not present

## 2024-01-07 DIAGNOSIS — R52 Pain, unspecified: Secondary | ICD-10-CM | POA: Diagnosis not present

## 2024-01-07 DIAGNOSIS — R531 Weakness: Secondary | ICD-10-CM | POA: Diagnosis not present

## 2024-01-07 DIAGNOSIS — M5481 Occipital neuralgia: Secondary | ICD-10-CM | POA: Diagnosis not present

## 2024-01-07 DIAGNOSIS — I69398 Other sequelae of cerebral infarction: Secondary | ICD-10-CM | POA: Diagnosis not present

## 2024-01-14 ENCOUNTER — Other Ambulatory Visit: Payer: Self-pay | Admitting: Physician Assistant

## 2024-01-14 DIAGNOSIS — I639 Cerebral infarction, unspecified: Secondary | ICD-10-CM

## 2024-01-14 DIAGNOSIS — I493 Ventricular premature depolarization: Secondary | ICD-10-CM

## 2024-01-14 DIAGNOSIS — R0602 Shortness of breath: Secondary | ICD-10-CM

## 2024-01-14 DIAGNOSIS — E785 Hyperlipidemia, unspecified: Secondary | ICD-10-CM

## 2024-01-14 DIAGNOSIS — R002 Palpitations: Secondary | ICD-10-CM

## 2024-01-14 DIAGNOSIS — I2510 Atherosclerotic heart disease of native coronary artery without angina pectoris: Secondary | ICD-10-CM

## 2024-01-14 DIAGNOSIS — I10 Essential (primary) hypertension: Secondary | ICD-10-CM

## 2024-01-20 ENCOUNTER — Ambulatory Visit: Attending: Neurology | Admitting: Physical Therapy

## 2024-01-20 ENCOUNTER — Encounter: Payer: Self-pay | Admitting: Physical Therapy

## 2024-01-20 DIAGNOSIS — M6281 Muscle weakness (generalized): Secondary | ICD-10-CM | POA: Insufficient documentation

## 2024-01-20 DIAGNOSIS — R269 Unspecified abnormalities of gait and mobility: Secondary | ICD-10-CM | POA: Insufficient documentation

## 2024-01-20 DIAGNOSIS — M2560 Stiffness of unspecified joint, not elsewhere classified: Secondary | ICD-10-CM | POA: Insufficient documentation

## 2024-01-20 DIAGNOSIS — R2689 Other abnormalities of gait and mobility: Secondary | ICD-10-CM | POA: Diagnosis present

## 2024-01-20 NOTE — Therapy (Signed)
 OUTPATIENT PHYSICAL THERAPY NEURO EVALUATION   Patient Name: Gerald [name] MRN: [id number] DOB:1965/07/21, 58 y.o., male Today's Date: 01/20/2024   PCP: Kristina Tinnie POUR, PA-C  REFERRING PROVIDER: Lane Arthea BRAVO, MD   END OF SESSION:  PT End of Session - 01/20/24 0905     Visit Number 1    Number of Visits 24    Date for Recertification  04/13/24    Progress Note Due on Visit 10    PT Start Time 0848    PT Stop Time 0930    PT Time Calculation (min) 42 min          Past Medical History:  Diagnosis Date   Abnormal LFTs    Anxiety    Atypical chest pain    Borderline diabetes    CAD (coronary artery disease)    a.) PCI and stent placement (unknown type) to mLAD and mLCx; date unknown. b.) LHC 09/10/2010: 40% mLAD; no intervention. c.) LHC 09/12/2011: EF 60%; 30% ISR mLAD, 40% dLAD, 30% pLCx, 30% ISR mLCx, 40% mRCA; no intervention. d.) LHC 05/23/2014: EF >55%; 30% p-mLAD; no intervention. e.) LHC 06/11/2016: EF 55-65%; LVEDP norm; 30-40% p-mLAD; no intervention.   Carcinoma (HCC)    CHF (congestive heart failure) (HCC)    Chronic pain syndrome    Depression    Diabetes mellitus without complication (HCC)    Fundic gland polyps of stomach, benign    Gastritis    GERD (gastroesophageal reflux disease)    Hepatic steatosis    Hyperlipidemia    Hypertension    Migraines    Myocardial infarction [facility]) 2008   was told by PCP   Nonischemic cardiomyopathy (HCC)    a.) TTE 09/12/2011: EF 35-45%. b.) LHC 09/12/2011: EF 60%. c.) TTE 06/04/2012: EF 55-60%. d.) LHC 05/23/2014: EF >55%. e.) TTE 06/11/2016: EF 55-60%; G1DD. f.) LHC 06/11/2016: EF 55-65%. g.) TTE 01/08/2018: EF 60-65%.   Obesity    Occlusion and stenosis of bilateral carotid arteries    OSA on CPAP    Osteoarthritis of both knees    Shortness of breath    Squamous cell carcinoma of skin 02/27/2020   left distal lat deltoid - ED[facility]   Stroke (HCC)    10/24, 3/25   Past Surgical History:   Procedure Laterality Date   BONE EXCISION Right 11/21/2020   Procedure: PART EXCISION BONE- PHALANX;  Surgeon: Ashley Soulier, DPM;  Location: [facility];  Service: Podiatry;  Laterality: Right;   CARDIAC CATHETERIZATION N/A 09/2011   [facility]; EF 50% with 30% mid LAD stenosis and no obstructive disease.   CARDIAC CATHETERIZATION  09/2010   [facility]; Mid LAD 40% stenosis; Mid Circumflex:Normal; Mid RCA; Normal   CARDIAC CATHETERIZATION     COLONOSCOPY     COLONOSCOPY WITH PROPOFOL  N/A 04/10/2021   Procedure: COLONOSCOPY WITH PROPOFOL ;  Surgeon: Therisa Bi, MD;  Location: [facility] ENDOSCOPY;  Service: Gastroenterology;  Laterality: N/A;   ESOPHAGOGASTRODUODENOSCOPY N/A 04/10/2021   Procedure: ESOPHAGOGASTRODUODENOSCOPY (EGD);  Surgeon: Therisa Bi, MD;  Location: [facility] ENDOSCOPY;  Service: Gastroenterology;  Laterality: N/A;   ESOPHAGOGASTRODUODENOSCOPY (EGD) WITH PROPOFOL  N/A 06/10/2017   Procedure: ESOPHAGOGASTRODUODENOSCOPY (EGD) WITH PROPOFOL ;  Surgeon: Therisa Bi, MD;  Location: [facility] ENDOSCOPY;  Service: Gastroenterology;  Laterality: N/A;   ESOPHAGOGASTRODUODENOSCOPY (EGD) WITH PROPOFOL  N/A 01/01/2022   Procedure: ESOPHAGOGASTRODUODENOSCOPY (EGD) WITH PROPOFOL ;  Surgeon: Therisa Bi, MD;  Location[facility] END[facility]COPY;  Service: Gastroenterology;  Laterality: N/A;   ESOPHAGOGASTRODUODENOSCOPY (EGD) WITH PROPOFOL  N/A 01/06/2022   Procedure: ESOPHAGOGASTRODUODENOSCOPY (  EGD) WITH PROPOFOL ;  Surgeon: Therisa Bi, MD;  Location: [facility] ENDOSCOPY;  Service: Gastroenterology;  Laterality: N/A;   HAMMER TOE SURGERY Right 11/21/2020   Procedure: HAMMERTOE CORRECTION;  Surgeon: Ashley Soulier, DPM;  Location: [facility];  Service: Podiatry;  Laterality: Right;   INSERTION OF MESH  04/16/2021   Procedure: INSERTION OF MESH;  Surgeon: Desiderio Schanz, MD;  Location: [facility] ORS;  Service: General;;   KNEE ARTHROSCOPY WITH MEDIAL MENISECTOMY Right 09/16/2012   Procedure: RIGHT KNEE ARTHROSCOPY WITH MEDIAL AND  LATERAL MENISECTOMY, CHONDROPLASTY;  Surgeon: Toribio JULIANNA Chancy, MD;  Location: [facility];  Service: Orthopedics;  Laterality: Right;  RIGHT KNEE SCOPE MEDIAL MENISCECTOMY   LEFT HEART CATH AND CORONARY ANGIOGRAPHY N/A 06/11/2016   Procedure: Left Heart Cath and Coronary Angiography;  Surgeon: Perla Evalene PARAS, MD;  Location: [facility] INVASIVE CV LAB;  Service: Cardiovascular;  Laterality: N/A;   UMBILICAL HERNIA REPAIR N/A 04/16/2021   Procedure: HERNIA REPAIR UMBILICAL ADULT, open;  Surgeon: Desiderio Schanz, MD;  Location: [facility] ORS;  Service: General;  Laterality: N/A;   Patient Active Problem List   Diagnosis Date Noted   Stroke (HCC) 05/10/2023   CVA (cerebral vascular accident) (HCC) 04/16/2023   Acute CVA (cerebrovascular accident) (HCC) 11/10/2022   OSA (obstructive sleep apnea) 08/18/2022   CPAP use counseling 08/18/2022   Essential hypertension 08/18/2022   Insufficient sleep syndrome 08/18/2022   Melena 01/06/2022   Postoperative abdominal pain 04/24/2021   Mesenteric panniculitis (HCC) 04/24/2021   Orthopnea    Infection of superficial incisional surgical site after procedure    Non-recurrent bilateral inguinal hernia without obstruction or gangrene    Umbilical hernia without obstruction and without gangrene    Impingement syndrome of right shoulder region 11/07/2019   Chest pain 01/08/2018   Gastroesophageal reflux disease without esophagitis 08/23/2017   Urinary tract infection without hematuria 06/14/2017   Generalized anxiety disorder 02/02/2017   Abnormal results of liver function studies 02/02/2017   Circadian rhythm sleep disorder, shift work type 02/02/2017   Acne vulgaris 02/02/2017   Occlusion and stenosis of bilateral carotid arteries 02/02/2017   Neoplasm of uncertain behavior of skin 02/02/2017   Morbid obesity (HCC) 07/07/2016   Atypical chest pain 06/08/2016   Mass of jaw 05/22/2014   Retaining fluid 11/08/2013   Urinary incontinence 03/14/2013    Depression 01/13/2013   Type 2 diabetes mellitus (HCC) 09/14/2012   Nonischemic cardiomyopathy (HCC)    Mixed hyperlipidemia 05/28/2012   Chronic pain syndrome 03/30/2012   HTN (hypertension) 03/30/2012    ONSET DATE: 10/24  REFERRING DIAG: Imbalance  THERAPY DIAG:  Muscle weakness (generalized)  Gait difficulty  Balance disorder  Imbalance  Rationale for Evaluation and Treatment: Rehabilitation  SUBJECTIVE:  SUBJECTIVE STATEMENT:  Pt was in PT from march to August then insurance denied further session per his report. He mentioned this to his MD that he tries exercise at home but he has trouble and has lost his balance and fallen to the floor several times. He is having severe L sided weakness and occasional numbness. His hips occasionally give way causing imbalance. Pt also has difficulties eating due to grasping utensils. Pt has been having headaches as well that feel like pressure radiating form the back of his head near base of occiput. Getting up and down from the chair is very hard and walking is difficult as well. Doing things throughout the day is exhausting due to fatigue. Pt cannot complete a task without taking a break in between. Pt second stroke only exacerbated these symptoms. Pt still has sensation on the left side but it is dull and numb. Pt is very nervous stepping up on something like a curb or step.   Pt accompanied by: self  PERTINENT HISTORY:  Pt was in PT from march to August then insurance denied further session per his report. He mentioned this to his MD that he tries exercise at home but he has trouble and has lost his balance and fallen to the floor several times. He is having severe L sided weakness and occasional numbness. His hips occasionally give way causing imbalance.  Pt also has difficulties eating due to grasping utensils. Pt has been having headaches as well that feel like pressure radiating form the back of his head near base of occiput. Getting up and down from the chair is very hard and walking is difficult as well. Doing things throughout the day is exhausting due to fatigue. Pt cannot complete a task without taking a break in between. Pt second stroke only exacerbated these symptoms. Pt still has sensation on the left side but it is dull and numb. Pt is very nervous stepping up on something like a curb or step.   Pt with relevant history of CAD, CHF, DM, GERD, MI, HTN, HLD, obseity and 2 x CVA (10/24 and 3/25). Per las MD note from referral. Patient overall unchanged symptoms. No new recent stroke like symptoms. Unchanged residual left sided weakness and numbness from previous strokes. Balance remains poor, no recent falls. Ambulates with a walker at all times. Continues to have numbness in the right thigh when exercising. Unchanged daily severe headaches that start in the occipital region and radiate to the right frontal region. Describes pain as a throbbing, pressure sensation. Sleep continues to remain poor but have somewhat improved.  PAIN:  Are you having pain? No  PRECAUTIONS: Fall  RED FLAGS: None   WEIGHT BEARING RESTRICTIONS: No  FALLS: Has patient fallen in last 6 months? Yes. Number of falls 2  LIVING ENVIRONMENT: Lives with: lives with their family and lives with their spouse Lives in: House/apartment Stairs: No Has following equipment at home: Single point cane and Environmental Consultant - 2 wheeled  PLOF: Independent, Independent with basic ADLs, Independent with gait, and Independent with transfers  PATIENT GOALS: improve mobility, strength, and balance  OBJECTIVE:  Note: Objective measures were completed at Evaluation unless otherwise noted.  DIAGNOSTIC FINDINGS: Last MRI on 07/28/23 :IMPRESSION: 1. No acute intracranial abnormality. 2.  Remote lacunar infarcts involving the left anterior genu of the corpus callosum, right lentiform nucleus, and right thalamus.  COGNITION: Overall cognitive status: Within functional limits for tasks assessed   SENSATION: Not tested Ipmaired on L side per  report      POSTURE: rounded shoulder and flexed trunk posture when standing and using UE support on walker   LOWER EXTREMITY ROM:     WNL for tasks assessed   LOWER EXTREMITY MMT:    MMT Right Eval Left Eval  Hip flexion 4+ 3  Hip extension    Hip abduction 4+ 3+  Hip adduction 4+ 3  Hip internal rotation    Hip external rotation    Knee flexion 4+ 3+  Knee extension 4+ 3+  Ankle dorsiflexion    Ankle plantarflexion    Ankle inversion    Ankle eversion    (Blank rows = not tested)  BED MOBILITY:  More time and needs time to adjust.   TRANSFERS: Sit to stand: CGA  Assistive device utilized: Environmental Consultant - 2 wheeled     Stand to sit: CGA  Assistive device utilized: Environmental Consultant - 2 wheeled     Chair to chair: Modified independence and CGA  Assistive device utilized: Environmental Consultant - 2 wheeled      Floor to chair- excessive time and effort per report  RAMP:  Not tested  CURB:  Check future session   STAIRS: Check future session  GAIT: Findings: Distance walked: 10 meters and Comments: slow gait speed, poor awareness of LLE foot position   FUNCTIONAL TESTS:  5 times sit to stand: 63.92 sec - completes 2.5 reps with heavy UE assist  Timed up and go (TUG): 82.58 se with RW 10 meter walk test: 68.43 sec with RW   PATIENT SURVEYS:  Stroke Impact Scale   Question date:   Dress the top part of your body? 75  Bathe yourself? 50  Get to the toilet on time? 75  Control your bladder (not have an accident)? 75  Control your bowels (not have an accident)? 75  Stand without losing balance? 50  Go shopping? 25  Do heavy household chores (e.g., vacuum, laundry, yard work)? 25  Stay sitting without losing balance? 75  Walk  without losing balance? 50  Move from a bed to a chair? 75  Walk fast? 0  Climb one flight of stairs? 0  Walk one block? 0  Get in and out of a car? 50  Carry heavy objects (e.g., bag of groceries) with your affected hand? 25  total out of 1600 725  total / [phone]                                                                                                                                01/20/2024   SELF CARE BP 134/78 using large cuff on dynamap  Patient instructed in plan of care, findings for evaluation, and ways of physical therapy may improve their function and quality of life.     PATIENT EDUCATION: Education details: POC Person educated: Patient Education method: Explanation Education comprehension: verbalized understanding   HOME EXERCISE PROGRAM: Establish visit 2  GOALS: Goals reviewed with patient? Yes  SHORT TERM GOALS: Target date: 02/17/2024       Patient will be independent in home exercise program to improve strength/mobility for better functional independence with ADLs. Baseline: No HEP currently  Goal status: INITIAL   LONG TERM GOALS: Target date: 04/13/2024    1.  Patient will complete five times sit to stand test in < 15 seconds indicating an increased LE strength and improved balance. Baseline: 5 times sit to stand: 63.92 sec - completes 2.5 reps with heavy UE assist   Goal status: INITIAL  2.  Patient will improve SIS 16 score to 55  to demonstrate statistically significant improvement in mobility and limitations as it relates to their QOL impact as a result of his CVA.  Baseline: 45.3 Goal status: INITIAL     3.   Patient will reduce timed up and go to <30 seconds to reduce fall risk and demonstrate improved transfer/gait ability. Baseline:  82.58 se with RW  Goal status: INITIAL  4.   Patient will increase 10 meter walk test to >1.109m/s as to improve gait speed for better community ambulation and to reduce fall  risk. Baseline:  68.43 sec with RW Goal status: INITIAL  5.   Patient will increase 2 minute walk test distance to >1000 for progression to community ambulator and improve gait ability Baseline: test visit 2  Goal status: INITIAL       ASSESSMENT:  CLINICAL IMPRESSION: Patient is a 58 y.o. M who was seen today for physical therapy evaluation and treatment for multiple CVAs. Pt previous therapy seemed not be focussed on mobility and balance based on pt reported exercises performed and described. Pt shows significant functional limitations as a result of his CVA including very slow gait speed, indicating inability to navigate in a community setting. Pt TUG indicated very high risk factor for falls and difficulty with the copmonents of standing up and walking. Pt also shows very limited functional strength based on LE MMT and 5XSTS as pt was unable to complete 5 STS in allotted time period. Pt will benefit from skilled PT to address his deficits, improve his balance, reduce his fall risk and frequency,  and improve his overall QOL.   OBJECTIVE IMPAIRMENTS: Abnormal gait, decreased balance, decreased coordination, decreased endurance, decreased mobility, difficulty walking, decreased strength, impaired perceived functional ability, and obesity.   ACTIVITY LIMITATIONS: carrying, lifting, standing, squatting, stairs, transfers, and locomotion level  PARTICIPATION LIMITATIONS: meal prep, driving, shopping, community activity, and yard work  PERSONAL FACTORS: Behavior pattern, Fitness, Time since onset of injury/illness/exacerbation, and 3+ comorbidities: CAD, CHF, DM, GERD, MI, HTN, HLD, obseity and 2 x CVA (10/24 and 3/25) are also affecting patient's functional outcome.   REHAB POTENTIAL: Good  CLINICAL DECISION MAKING: Evolving/moderate complexity  EVALUATION COMPLEXITY: Moderate  PLAN:  PT FREQUENCY: 2x/week  PT DURATION: 12 weeks  PLANNED INTERVENTIONS: 97750- Physical  Performance Testing, 97110-Therapeutic exercises, 97530- Therapeutic activity, 97112- Neuromuscular re-education, 97535- Self Care, [id number]- Manual therapy, [phone]- Gait training, Patient/Family education, Balance training, Stair training, Vestibular training, Visual/preceptual remediation/compensation, DME instructions, and Wheelchair mobility training  PLAN FOR NEXT SESSION: , LE strength an balance focussed HEP, begin POC for balance, strength and functional mobility    Lonni KATHEE Gainer, PT 01/20/2024, 9:59 AM

## 2024-01-21 ENCOUNTER — Encounter: Payer: Self-pay | Admitting: Physician Assistant

## 2024-01-21 ENCOUNTER — Ambulatory Visit: Admitting: Physician Assistant

## 2024-01-21 VITALS — BP 124/79 | HR 82 | Temp 98.0°F | Resp 16 | Ht 74.0 in | Wt 331.6 lb

## 2024-01-21 DIAGNOSIS — I6930 Unspecified sequelae of cerebral infarction: Secondary | ICD-10-CM

## 2024-01-21 DIAGNOSIS — R002 Palpitations: Secondary | ICD-10-CM | POA: Diagnosis not present

## 2024-01-21 DIAGNOSIS — E1165 Type 2 diabetes mellitus with hyperglycemia: Secondary | ICD-10-CM

## 2024-01-21 DIAGNOSIS — M5481 Occipital neuralgia: Secondary | ICD-10-CM

## 2024-01-21 DIAGNOSIS — G5793 Unspecified mononeuropathy of bilateral lower limbs: Secondary | ICD-10-CM

## 2024-01-21 DIAGNOSIS — I10 Essential (primary) hypertension: Secondary | ICD-10-CM | POA: Diagnosis not present

## 2024-01-21 DIAGNOSIS — Z794 Long term (current) use of insulin: Secondary | ICD-10-CM

## 2024-01-21 MED ORDER — LAMOTRIGINE 150 MG PO TABS: 150.0000 mg | ORAL_TABLET | Freq: Two times a day (BID) | ORAL | Status: AC

## 2024-01-21 NOTE — Progress Notes (Signed)
 [facility] [address] [place], [place] [address]  Internal MEDICINE  Office Visit Note  Patient Name: Gerald [name]  [id number]  [id number]  Date of Service: 01/21/2024  Chief Complaint  Patient presents with   Follow-up   Diabetes   Hypertension   Headache    Daily headaches at the back of head, will also notify neurology    HPI Pt is here for routine follow up -BP doing better overall, doing well most days and then will have an off day where is spikes. -Had a heart monitor recently and will have echo Tuesday then will review tests with cardilogy. Did start on metoprolol  for the palpitations and it has helped. -BG avg 160, in AM 150, but has gotten as low as 90. Thinks trulicity  is working as does feel appetite suppression now -Continues to have daily headaches, most significantly in the back of his head consistent with occipital neuralgia which he has been diagnosed with. States injections done once and did help but only very short term. He will follow up with neurology about ongoing problem -Neurology adjusting meds and he is now taking Seroquel 200mg ; lexapro 10mg  and stopped celexa ; Lamictal  150mg  BID, and nortriptyline 50mg  at bedtime  -PT starting At [facility] OP center, went for first meeting yesterday and will be starting PT Monday  Current Medication: Outpatient Encounter Medications as of 01/21/2024  Medication Sig   citalopram  (CELEXA ) 40 MG tablet TAKE 1 TABLET BY MOUTH EVERY DAY   clopidogrel  (PLAVIX ) 75 MG tablet Take 1 tablet (75 mg total) by mouth daily.   Continuous Glucose Sensor (DEXCOM G7 SENSOR) MISC Use every 10 days as directed. Dx: E11.65.   cyclobenzaprine  (FLEXERIL ) 10 MG tablet TAKE 1 TABLET (10 MG TOTAL) BY MOUTH AT BEDTIME FOR BACK SPASM AS NEEDED ONLY   Dulaglutide  (TRULICITY ) 4.5 MG/0.5ML SOAJ Inject 4.5 mg as directed once a week.   empagliflozin  (JARDIANCE ) 25 MG TABS tablet Take 1 tablet (25 mg total) by mouth daily before  breakfast.   Evolocumab  (REPATHA  SURECLICK) 140 MG/ML SOAJ Inject 140 mg into the skin every 14 (fourteen) days.   furosemide  (LASIX ) 20 MG tablet TAKE 1 TABLET BY MOUTH EVERY DAY AS NEEDED FOR LEG SWELLING   gabapentin  (NEURONTIN ) 300 MG capsule TAKE 1 CAPSULE BY MOUTH THREE TIMES A DAY   insulin  aspart (NOVOLOG  FLEXPEN) 100 UNIT/ML FlexPen Inject with meals per sliding scale. Max 14 units   insulin  degludec (TRESIBA  FLEXTOUCH) 100 UNIT/ML FlexTouch Pen Inject 34 units in am sq   Insulin  Pen Needle (BD PEN NEEDLE NANO U/F) 32G X 4 MM MISC Use as directed once a daily   lamoTRIgine  (LAMICTAL ) 25 MG tablet Take 1 tablet (25 mg total) by mouth at bedtime for 14 days, THEN 1 tablet (25 mg total) 2 (two) times daily for 14 days.   lisinopril  (ZESTRIL ) 10 MG tablet Take 1 tablet (10 mg total) by mouth daily.   lisinopril -hydrochlorothiazide  (ZESTORETIC ) 20-25 MG tablet TAKE 1 TABLET BY MOUTH EVERY DAY   metoprolol  succinate (TOPROL  XL) 25 MG 24 hr tablet Take 1 tablet (25 mg total) by mouth daily.   Multiple Vitamins-Minerals (MULTIVITAMIN ADULTS PO) Take 1 tablet by mouth daily.   nitroGLYCERIN  (NITROSTAT ) 0.4 MG SL tablet Place 1 tablet (0.4 mg total) under the tongue every 5 (five) minutes x 3 doses as needed for chest pain.   nortriptyline (PAMELOR) 10 MG capsule Take 10 mg by mouth at bedtime.   nystatin -triamcinolone  ointment (MYCOLOG) Apply 1  Application topically 2 (two) times daily.   potassium chloride  (KLOR-CON  M) 10 MEQ tablet TAKE 1 TABLET BY MOUTH AS NEEDED WITH FUROSEMIDE . (ONLY FOR LOW POTASSIUM)   rosuvastatin  (CRESTOR ) 5 MG tablet Take 1 tablet (5 mg total) by mouth at bedtime.   sulfamethoxazole -trimethoprim  (BACTRIM  DS) 800-160 MG tablet TAKE 1 TABLET BY MOUTH EVERY 12 HOURS   Vibegron  (GEMTESA ) 75 MG TABS Take 1 tablet (75 mg total) by mouth daily.   [DISCONTINUED] aspirin  EC 81 MG tablet Take 1 tablet (81 mg total) by mouth daily. Swallow whole.   No facility-administered  encounter medications on file as of 01/21/2024.    Surgical History: Past Surgical History:  Procedure Laterality Date   BONE EXCISION Right 11/21/2020   Procedure: PART EXCISION BONE- PHALANX;  Surgeon: Ashley Soulier, DPM;  Location: [facility];  Service: Podiatry;  Laterality: Right;   CARDIAC CATHETERIZATION N/A 09/2011   [facility]; EF 50% with 30% mid LAD stenosis and no obstructive disease.   CARDIAC CATHETERIZATION  09/2010   [facility]; Mid LAD 40% stenosis; Mid Circumflex:Normal; Mid RCA; Normal   CARDIAC CATHETERIZATION     COLONOSCOPY     COLONOSCOPY WITH PROPOFOL  N/A 04/10/2021   Procedure: COLONOSCOPY WITH PROPOFOL ;  Surgeon: Therisa Bi, MD;  Location: [facility] ENDOSCOPY;  Service: Gastroenterology;  Laterality: N/A;   ESOPHAGOGASTRODUODENOSCOPY N/A 04/10/2021   Procedure: ESOPHAGOGASTRODUODENOSCOPY (EGD);  Surgeon: Therisa Bi, MD;  Location: [facility] ENDOSCOPY;  Service: Gastroenterology;  Laterality: N/A;   ESOPHAGOGASTRODUODENOSCOPY (EGD) WITH PROPOFOL  N/A 06/10/2017   Procedure: ESOPHAGOGASTRODUODENOSCOPY (EGD) WITH PROPOFOL ;  Surgeon: Therisa Bi, MD;  Location: [facility] ENDOSCOPY;  Service: Gastroenterology;  Laterality: N/A;   ESOPHAGOGASTRODUODENOSCOPY (EGD) WITH PROPOFOL  N/A 01/01/2022   Procedure: ESOPHAGOGASTRODUODENOSCOPY (EGD) WITH PROPOFOL ;  Surgeon: Therisa Bi, MD;  Location[facility];  Service: Gastroenterology;  Laterality: N/A;   ESOPHAGOGASTRODUODENOSCOPY (EGD) WITH PROPOFOL  N/A 01/06/2022   Procedure: ESOPHAGOGASTRODUODENOSCOPY (EGD) WITH PROPOFOL ;  Surgeon: Therisa Bi, MD;  Location: [facility] END[facility];  Service: Gastroenterology;  Laterality: N/A;   HAMMER TOE SURGERY Right 11/21/2020   Procedure: HAMMERTOE CORRECTION;  Surgeon: Ashley Soulier, DPM;  Location: [facility];  Service: Podiatry;  Laterality: Right;   INSERTION OF MESH  04/16/2021   Procedure: INSERTION OF MESH;  Surgeon: Desiderio Schanz, MD;  Location: [facility] ORS;  Service: General;;   KNEE ARTHROSCOPY  WITH MEDIAL MENISECTOMY Right 09/16/2012   Procedure: RIGHT KNEE ARTHROSCOPY WITH MEDIAL AND LATERAL MENISECTOMY, CHONDROPLASTY;  Surgeon: Toribio JULIANNA Chancy, MD;  Location: [facility];  Service: Orthopedics;  Laterality: Right;  RIGHT KNEE SCOPE MEDIAL MENISCECTOMY   LEFT HEART CATH AND CORONARY ANGIOGRAPHY N/A 06/11/2016   Procedure: Left Heart Cath and Coronary Angiography;  Surgeon: Perla Evalene PARAS, MD;  Location: [facility] INVASIVE CV LAB;  Service: Cardiovascular;  Laterality: N/A;   UMBILICAL HERNIA REPAIR N/A 04/16/2021   Procedure: HERNIA REPAIR UMBILICAL ADULT, open;  Surgeon: Desiderio Schanz, MD;  Location: [facility] ORS;  Service: General;  Laterality: N/A;    Medical History: Past Medical History:  Diagnosis Date   Abnormal LFTs    Anxiety    Atypical chest pain    Borderline diabetes    CAD (coronary artery disease)    a.) PCI and stent placement (unknown type) to mLAD and mLCx; date unknown. b.) LHC 09/10/2010: 40% mLAD; no intervention. c.) LHC 09/12/2011: EF 60%; 30% ISR mLAD, 40% dLAD, 30% pLCx, 30% ISR mLCx, 40% mRCA; no intervention. d.) LHC 05/23/2014: EF >55%; 30% p-mLAD; no intervention. e.) LHC 06/11/2016: EF 55-65%;  LVEDP norm; 30-40% p-mLAD; no intervention.   Carcinoma (HCC)    CHF (congestive heart failure) (HCC)    Chronic pain syndrome    Depression    Diabetes mellitus without complication (HCC)    Fundic gland polyps of stomach, benign    Gastritis    GERD (gastroesophageal reflux disease)    Hepatic steatosis    Hyperlipidemia    Hypertension    Migraines    Myocardial infarction [facility]) 2008   was told by PCP   Nonischemic cardiomyopathy (HCC)    a.) TTE 09/12/2011: EF 35-45%. b.) LHC 09/12/2011: EF 60%. c.) TTE 06/04/2012: EF 55-60%. d.) LHC 05/23/2014: EF >55%. e.) TTE 06/11/2016: EF 55-60%; G1DD. f.) LHC 06/11/2016: EF 55-65%. g.) TTE 01/08/2018: EF 60-65%.   Obesity    Occlusion and stenosis of bilateral carotid arteries    OSA on CPAP     Osteoarthritis of both knees    Shortness of breath    Squamous cell carcinoma of skin 02/27/2020   left distal lat deltoid - [facility]   Stroke (HCC)    10/24, 3/25    Family History: Family History  Problem Relation Age of Onset   Heart disease Father 20       MI   Heart attack Father    Stomach cancer Father    Hypertension Mother    Breast cancer Mother     Social History   Socioeconomic History   Marital status: Married    Spouse name: Not on file   Number of children: 3   Years of education: Not on file   Highest education level: Not on file  Occupational History   Occupation: Camera Operator  Tobacco Use   Smoking status: Never    Passive exposure: Past   Smokeless tobacco: Former    Types: Chew    Quit date: 1987  Vaping Use   Vaping status: Never Used  Substance and Sexual Activity   Alcohol use: Not Currently   Drug use: No   Sexual activity: Yes  Other Topics Concern   Not on file  Social History Narrative   Married.  23 yo son.   Wife on disability for bipolar disorder.     Social Drivers of Health   Tobacco Use: Medium Risk (01/21/2024)   Patient History    Smoking Tobacco Use: Never    Smokeless Tobacco Use: Former    Passive Exposure: Past  Physicist, Medical Strain: Not on file  Food Insecurity: Food Insecurity Present (04/17/2023)   Hunger Vital Sign    Worried About Running Out of Food in the Last Year: Sometimes true    Ran Out of Food in the Last Year: Sometimes true  Transportation Needs: No Transportation Needs (04/17/2023)   PRAPARE - Administrator, Civil Service (Medical): No    Lack of Transportation (Non-Medical): No  Physical Activity: Not on file  Stress: Not on file  Social Connections: Not on file  Intimate Partner Violence: Not At Risk (04/17/2023)   Humiliation, Afraid, Rape, and Kick questionnaire    Fear of Current or Ex-Partner: No    Emotionally Abused: No    Physically Abused: No    Sexually Abused: No   Depression (PHQ2-9): Low Risk (09/25/2023)   Depression (PHQ2-9)    PHQ-2 Score: 0  Alcohol Screen: Not on file  Housing: Unknown (04/22/2023)   Received from [facility]   Epic    Unable to Pay for Housing in the  Last Year: Not on file    Number of Times Moved in the Last Year: Not on file    At any time in the past 12 months, were you homeless or living in a shelter (including now)?: No  Utilities: Not At Risk (04/17/2023)   [facility] Utilities    Threatened with loss of utilities: No  Health Literacy: Not on file      Review of Systems  Constitutional:  Positive for fatigue. Negative for chills and unexpected weight change.  HENT:  Negative for congestion, postnasal drip, rhinorrhea, sneezing and sore throat.   Eyes:  Negative for redness.  Respiratory:  Negative for cough, chest tightness and shortness of breath.   Cardiovascular:  Negative for chest pain and palpitations.  Gastrointestinal:  Negative for anal bleeding, constipation, diarrhea, nausea and vomiting.  Genitourinary:  Negative for dysuria.  Musculoskeletal:  Positive for arthralgias, back pain, gait problem and myalgias. Negative for joint swelling and neck pain.  Skin:  Negative for rash.  Neurological:  Positive for weakness, numbness and headaches.  Hematological:  Negative for adenopathy. Does not bruise/bleed easily.  Psychiatric/Behavioral:  Positive for behavioral problems (Depression) and sleep disturbance. Negative for self-injury and suicidal ideas. The patient is nervous/anxious.     Vital Signs: BP 124/79   Pulse 82   Temp 98 F (36.7 C)   Resp 16   Ht 6' 2 (1.88 m)   Wt (!) 331 lb 9.6 oz (150.4 kg)   SpO2 97%   BMI 42.57 kg/m    Physical Exam Vitals and nursing note reviewed.  Constitutional:      Appearance: Normal appearance.  HENT:     Head: Normocephalic and atraumatic.  Eyes:     Extraocular Movements: Extraocular movements intact.  Cardiovascular:     Rate and  Rhythm: Normal rate and regular rhythm.     Pulses: Normal pulses.     Heart sounds: Normal heart sounds.  Pulmonary:     Effort: Pulmonary effort is normal.     Breath sounds: Normal breath sounds.  Skin:    General: Skin is warm and dry.  Neurological:     General: No focal deficit present.     Mental Status: He is alert.     Gait: Gait abnormal.     Comments: Walking with cane  Psychiatric:        Mood and Affect: Mood normal.        Behavior: Behavior normal.        Assessment/Plan: 1. Essential hypertension (Primary) Improved, continue current medications  2. Palpitations Improving, followed by cardiology  3. Type 2 diabetes mellitus with hyperglycemia, with long-term current use of insulin  (HCC) Continue current medications and monitoring  4. History of stroke with current residual effects Followed by neurology  5. Neuropathy involving both lower extremities Followed by neurology  6. Bilateral occipital neuralgia Followed by neurology, pt will call due to continued daily headaches due to this. If new or sudden worsening symptoms call or go to ED.   General Counseling: lavern maslow understanding of the findings of todays visit and agrees with plan of treatment. I have discussed any further diagnostic evaluation that may be needed or ordered today. We also reviewed his medications today. he has been encouraged to call the office with any questions or concerns that should arise related to todays visit.    No orders of the defined types were placed in this encounter.   No orders of the defined types were  placed in this encounter.   This patient was seen by Tinnie Pro, PA-C in collaboration with Dr. Sigrid Bathe as a part of collaborative care agreement.   Total time spent:30 Minutes Time spent includes review of chart, medications, test results, and follow up plan with the patient.      Dr Fozia M Khan Internal medicine

## 2024-01-22 NOTE — Therapy (Unsigned)
 " OUTPATIENT PHYSICAL THERAPY NEURO TREATMENT   Patient Name: Gerald [name] MRN: [id number] DOB:[birth year], 58 y.o., male Today's Date: 01/25/2024   PCP: Kristina Tinnie POUR, PA-C  REFERRING PROVIDER: Lane Arthea BRAVO, MD   END OF SESSION:  PT End of Session - 01/25/24 0936     Visit Number 2    Number of Visits 24    Date for Recertification  04/13/24    Progress Note Due on Visit 10    PT Start Time 0932    PT Stop Time 1015    PT Time Calculation (min) 43 min           Past Medical History:  Diagnosis Date   Abnormal LFTs    Anxiety    Atypical chest pain    Borderline diabetes    CAD (coronary artery disease)    a.) PCI and stent placement (unknown type) to mLAD and mLCx; date unknown. b.) LHC 09/10/2010: 40% mLAD; no intervention. c.) LHC 09/12/2011: EF 60%; 30% ISR mLAD, 40% dLAD, 30% pLCx, 30% ISR mLCx, 40% mRCA; no intervention. d.) LHC 05/23/2014: EF >55%; 30% p-mLAD; no intervention. e.) LHC 06/11/2016: EF 55-65%; LVEDP norm; 30-40% p-mLAD; no intervention.   Carcinoma (HCC)    CHF (congestive heart failure) (HCC)    Chronic pain syndrome    Depression    Diabetes mellitus without complication (HCC)    Fundic gland polyps of stomach, benign    Gastritis    GERD (gastroesophageal reflux disease)    Hepatic steatosis    Hyperlipidemia    Hypertension    Migraines    Myocardial infarction [facility]) 2008   was told by PCP   Nonischemic cardiomyopathy (HCC)    a.) TTE 09/12/2011: EF 35-45%. b.) LHC 09/12/2011: EF 60%. c.) TTE 06/04/2012: EF 55-60%. d.) LHC 05/23/2014: EF >55%. e.) TTE 06/11/2016: EF 55-60%; G1DD. f.) LHC 06/11/2016: EF 55-65%. g.) TTE 01/08/2018: EF 60-65%.   Obesity    Occlusion and stenosis of bilateral carotid arteries    OSA on CPAP    Osteoarthritis of both knees    Shortness of breath    Squamous cell carcinoma of skin 02/27/2020   left distal lat deltoid - ED[facility]   Stroke (HCC)    10/24, 3/25   Past Surgical History:   Procedure Laterality Date   BONE EXCISION Right 11/21/2020   Procedure: PART EXCISION BONE- PHALANX;  Surgeon: Ashley Soulier, DPM;  Location: [facility];  Service: Podiatry;  Laterality: Right;   CARDIAC CATHETERIZATION N/A 09/2011   [facility]; EF 50% with 30% mid LAD stenosis and no obstructive disease.   CARDIAC CATHETERIZATION  09/2010   [facility]; Mid LAD 40% stenosis; Mid Circumflex:Normal; Mid RCA; Normal   CARDIAC CATHETERIZATION     COLONOSCOPY     COLONOSCOPY WITH PROPOFOL  N/A 04/10/2021   Procedure: COLONOSCOPY WITH PROPOFOL ;  Surgeon: Therisa Bi, MD;  Location: [facility] ENDOSCOPY;  Service: Gastroenterology;  Laterality: N/A;   ESOPHAGOGASTRODUODENOSCOPY N/A 04/10/2021   Procedure: ESOPHAGOGASTRODUODENOSCOPY (EGD);  Surgeon: Therisa Bi, MD;  Location: [facility] ENDOSCOPY;  Service: Gastroenterology;  Laterality: N/A;   ESOPHAGOGASTRODUODENOSCOPY (EGD) WITH PROPOFOL  N/A 06/10/2017   Procedure: ESOPHAGOGASTRODUODENOSCOPY (EGD) WITH PROPOFOL ;  Surgeon: Therisa Bi, MD;  Location: [facility] ENDOSCOPY;  Service: Gastroenterology;  Laterality: N/A;   ESOPHAGOGASTRODUODENOSCOPY (EGD) WITH PROPOFOL  N/A 01/01/2022   Procedure: ESOPHAGOGASTRODUODENOSCOPY (EGD) WITH PROPOFOL ;  Surgeon: Therisa Bi, MD;  Location: [facility] ENDOSCOPY;  Service: Gastroenterology;  Laterality: N/A;   ESOPHAGOGASTRODUODENOSCOPY (EGD) WITH PROPOFOL  N/A 01/06/2022  Procedure: ESOPHAGOGASTRODUODENOSCOPY (EGD) WITH PROPOFOL ;  Surgeon: Therisa Bi, MD;  Location: [facility] ENDOSCOPY;  Service: Gastroenterology;  Laterality: N/A;   HAMMER TOE SURGERY Right 11/21/2020   Procedure: HAMMERTOE CORRECTION;  Surgeon: Ashley Soulier, DPM;  Location: [facility];  Service: Podiatry;  Laterality: Right;   INSERTION OF MESH  04/16/2021   Procedure: INSERTION OF MESH;  Surgeon: Desiderio Schanz, MD;  Location: [facility] ORS;  Service: General;;   KNEE ARTHROSCOPY WITH MEDIAL MENISECTOMY Right 09/16/2012   Procedure: RIGHT KNEE ARTHROSCOPY WITH MEDIAL AND  LATERAL MENISECTOMY, CHONDROPLASTY;  Surgeon: Toribio JULIANNA Chancy, MD;  Location: [facility];  Service: Orthopedics;  Laterality: Right;  RIGHT KNEE SCOPE MEDIAL MENISCECTOMY   LEFT HEART CATH AND CORONARY ANGIOGRAPHY N/A 06/11/2016   Procedure: Left Heart Cath and Coronary Angiography;  Surgeon: Perla Evalene PARAS, MD;  Location: [facility] INVASIVE CV LAB;  Service: Cardiovascular;  Laterality: N/A;   UMBILICAL HERNIA REPAIR N/A 04/16/2021   Procedure: HERNIA REPAIR UMBILICAL ADULT, open;  Surgeon: Desiderio Schanz, MD;  Location: [facility] ORS;  Service: General;  Laterality: N/A;   Patient Active Problem List   Diagnosis Date Noted   Stroke (HCC) 05/10/2023   CVA (cerebral vascular accident) (HCC) 04/16/2023   Acute CVA (cerebrovascular accident) (HCC) 11/10/2022   OSA (obstructive sleep apnea) 08/18/2022   CPAP use counseling 08/18/2022   Essential hypertension 08/18/2022   Insufficient sleep syndrome 08/18/2022   Melena 01/06/2022   Postoperative abdominal pain 04/24/2021   Mesenteric panniculitis (HCC) 04/24/2021   Orthopnea    Infection of superficial incisional surgical site after procedure    Non-recurrent bilateral inguinal hernia without obstruction or gangrene    Umbilical hernia without obstruction and without gangrene    Impingement syndrome of right shoulder region 11/07/2019   Chest pain 01/08/2018   Gastroesophageal reflux disease without esophagitis 08/23/2017   Urinary tract infection without hematuria 06/14/2017   Generalized anxiety disorder 02/02/2017   Abnormal results of liver function studies 02/02/2017   Circadian rhythm sleep disorder, shift work type 02/02/2017   Acne vulgaris 02/02/2017   Occlusion and stenosis of bilateral carotid arteries 02/02/2017   Neoplasm of uncertain behavior of skin 02/02/2017   Morbid obesity (HCC) 07/07/2016   Atypical chest pain 06/08/2016   Mass of jaw 05/22/2014   Retaining fluid 11/08/2013   Urinary incontinence 03/14/2013    Depression 01/13/2013   Type 2 diabetes mellitus (HCC) 09/14/2012   Nonischemic cardiomyopathy (HCC)    Mixed hyperlipidemia 05/28/2012   Chronic pain syndrome 03/30/2012   HTN (hypertension) 03/30/2012    ONSET DATE: 10/24  REFERRING DIAG: Imbalance  THERAPY DIAG:  Muscle weakness (generalized)  Gait difficulty  Balance disorder  Imbalance  Rationale for Evaluation and Treatment: Rehabilitation  SUBJECTIVE:  SUBJECTIVE STATEMENT:  Pt reports no updates since last session medically. Will be getting new medicaid insurance at start of year. Explained potential changes with therapy visits.   From Eval: Pt was in PT from march to August then insurance denied further session per his report. He mentioned this to his MD that he tries exercise at home but he has trouble and has lost his balance and fallen to the floor several times. He is having severe L sided weakness and occasional numbness. His hips occasionally give way causing imbalance. Pt also has difficulties eating due to grasping utensils. Pt has been having headaches as well that feel like pressure radiating form the back of his head near base of occiput. Getting up and down from the chair is very hard and walking is difficult as well. Doing things throughout the day is exhausting due to fatigue. Pt cannot complete a task without taking a break in between. Pt second stroke only exacerbated these symptoms. Pt still has sensation on the left side but it is dull and numb. Pt is very nervous stepping up on something like a curb or step.   Pt accompanied by: self  PERTINENT HISTORY:  From Eval:  Pt was in PT from march to August then insurance denied further session per his report. He mentioned this to his MD that he tries exercise at home but he has  trouble and has lost his balance and fallen to the floor several times. He is having severe L sided weakness and occasional numbness. His hips occasionally give way causing imbalance. Pt also has difficulties eating due to grasping utensils. Pt has been having headaches as well that feel like pressure radiating form the back of his head near base of occiput. Getting up and down from the chair is very hard and walking is difficult as well. Doing things throughout the day is exhausting due to fatigue. Pt cannot complete a task without taking a break in between. Pt second stroke only exacerbated these symptoms. Pt still has sensation on the left side but it is dull and numb. Pt is very nervous stepping up on something like a curb or step.   Pt was in PT from march to August then insurance denied further session per his report. He mentioned this to his MD that he tries exercise at home but he has trouble and has lost his balance and fallen to the floor several times. He is having severe L sided weakness and occasional numbness. His hips occasionally give way causing imbalance. Pt also has difficulties eating due to grasping utensils. Pt has been having headaches as well that feel like pressure radiating form the back of his head near base of occiput. Getting up and down from the chair is very hard and walking is difficult as well. Doing things throughout the day is exhausting due to fatigue. Pt cannot complete a task without taking a break in between. Pt second stroke only exacerbated these symptoms. Pt still has sensation on the left side but it is dull and numb. Pt is very nervous stepping up on something like a curb or step.   Pt with relevant history of CAD, CHF, DM, GERD, MI, HTN, HLD, obseity and 2 x CVA (10/24 and 3/25). Per las MD note from referral. Patient overall unchanged symptoms. No new recent stroke like symptoms. Unchanged residual left sided weakness and numbness from previous strokes. Balance  remains poor, no recent falls. Ambulates with a walker at all times. Continues to  have numbness in the right thigh when exercising. Unchanged daily severe headaches that start in the occipital region and radiate to the right frontal region. Describes pain as a throbbing, pressure sensation. Sleep continues to remain poor but have somewhat improved.  PAIN:  Are you having pain? No  PRECAUTIONS: Fall  RED FLAGS: None   WEIGHT BEARING RESTRICTIONS: No  FALLS: Has patient fallen in last 6 months? Yes. Number of falls 2  LIVING ENVIRONMENT: Lives with: lives with their family and lives with their spouse Lives in: House/apartment Stairs: No Has following equipment at home: Single point cane and Environmental Consultant - 2 wheeled  PLOF: Independent, Independent with basic ADLs, Independent with gait, and Independent with transfers  PATIENT GOALS: improve mobility, strength, and balance  OBJECTIVE:  Note: Objective measures were completed at Evaluation unless otherwise noted.  DIAGNOSTIC FINDINGS: Last MRI on 07/28/23 :IMPRESSION: 1. No acute intracranial abnormality. 2. Remote lacunar infarcts involving the left anterior genu of the corpus callosum, right lentiform nucleus, and right thalamus.  COGNITION: Overall cognitive status: Within functional limits for tasks assessed   SENSATION: Not tested Ipmaired on L side per report      POSTURE: rounded shoulder and flexed trunk posture when standing and using UE support on walker   LOWER EXTREMITY ROM:     WNL for tasks assessed   LOWER EXTREMITY MMT:    MMT Right Eval Left Eval  Hip flexion 4+ 3  Hip extension    Hip abduction 4+ 3+  Hip adduction 4+ 3  Hip internal rotation    Hip external rotation    Knee flexion 4+ 3+  Knee extension 4+ 3+  Ankle dorsiflexion    Ankle plantarflexion    Ankle inversion    Ankle eversion    (Blank rows = not tested)  BED MOBILITY:  More time and needs time to adjust.   TRANSFERS: Sit  to stand: CGA  Assistive device utilized: Environmental Consultant - 2 wheeled     Stand to sit: CGA  Assistive device utilized: Environmental Consultant - 2 wheeled     Chair to chair: Modified independence and CGA  Assistive device utilized: Environmental Consultant - 2 wheeled      Floor to chair- excessive time and effort per report  RAMP:  Not tested  CURB:  Check future session   STAIRS: Check future session  GAIT: Findings: Distance walked: 10 meters and Comments: slow gait speed, poor awareness of LLE foot position   FUNCTIONAL TESTS:  5 times sit to stand: 63.92 sec - completes 2.5 reps with heavy UE assist  Timed up and go (TUG): 82.58 se with RW 10 meter walk test: 68.43 sec with RW   PATIENT SURVEYS:  Stroke Impact Scale   Question date:   Dress the top part of your body? 75  Bathe yourself? 50  Get to the toilet on time? 75  Control your bladder (not have an accident)? 75  Control your bowels (not have an accident)? 75  Stand without losing balance? 50  Go shopping? 25  Do heavy household chores (e.g., vacuum, laundry, yard work)? 25  Stay sitting without losing balance? 75  Walk without losing balance? 50  Move from a bed to a chair? 75  Walk fast? 0  Climb one flight of stairs? 0  Walk one block? 0  Get in and out of a car? 50  Carry heavy objects (e.g., bag of groceries) with your affected hand? 25  total out of 1600 725  total / [phone]                                                                                                                                01/25/2024    75 ft with BRW - very fatigued   Community Ambulation Potential: A distance of >113 meters in the suggests a patient is likely to achieve community ambulation (equivalent to >361m in a 6-minute test). Minimal Detectable Change (MDC): For a change in performance to be considered clinically significant in adults, an improvement of at least 42.5 meters is typically required. Clinical Conditions: Average distances are  significantly lower for those with medical conditions, such as mild Multiple Sclerosis (132m) or post-stroke (approx. 86m-113m)  Standing march 2 x 10  Mini squat 2 x 10  Standing sidestepping 2 x 10   NMR: To facilitate reeducation of movement, balance, posture, coordination, and/or proprioception/kinesthetic sense.  1LE on airex other on 6 in step ant 3 x 10 sec ea- much higher difficulty on L on foam than R dues to knee instability ( unable to hold extension)   Pt required occasional rest breaks due fatigue, PT was attentive to when pt appeared to be tired or winded in order to prevent excessive fatigue.   PATIENT EDUCATION: Education details: POC Person educated: Patient Education method: Explanation Education comprehension: verbalized understanding   HOME EXERCISE PROGRAM: Access Code: [email or web address] URL: [email or web address] Date: 01/25/2024 Prepared by: Lonni Gainer  Exercises - Marching Near Counter  - 1 x daily - 4 x weekly - 2 sets - 10 reps - Sidestepping near counter   - 1 x daily - 4 x weekly - 2 sets - 10 reps - Mini Squat with Counter Support  - 1 x daily - 4 x weekly - 2 sets - 10 reps - Heel Raises with Counter Support  - 1 x daily - 4 x weekly - 2 sets - 10 reps   GOALS: Goals reviewed with patient? Yes  SHORT TERM GOALS: Target date: 02/17/2024       Patient will be independent in home exercise program to improve strength/mobility for better functional independence with ADLs. Baseline: No HEP currently  Goal status: INITIAL   LONG TERM GOALS: Target date: 04/13/2024    1.  Patient will complete five times sit to stand test in < 15 seconds indicating an increased LE strength and improved balance. Baseline: 5 times sit to stand: 63.92 sec - completes 2.5 reps with heavy UE assist   Goal status: INITIAL  2.  Patient will improve SIS 16 score to 55  to demonstrate statistically significant improvement in mobility and limitations as it  relates to their QOL impact as a result of his CVA.  Baseline: 45.3 Goal status: INITIAL     3.   Patient will reduce timed up and go to <30 seconds to reduce fall risk and demonstrate improved transfer/gait ability. Baseline:  82.58  se with RW  Goal status: INITIAL  4.   Patient will increase 10 meter walk test to >1.25m/s as to improve gait speed for better community ambulation and to reduce fall risk. Baseline:  68.43 sec with RW Goal status: INITIAL  5.   Patient will increase 2 minute walk test distance to >1000 for progression to community ambulator and improve gait ability Baseline: test visit 2  Goal status: INITIAL       ASSESSMENT:  CLINICAL IMPRESSION: Patient arrived with good motivation for completion of pt activities.  Patient introduced to initial home exercise program involving many standing activities to improve his functional strength and balance.  Patient also performed 2-minute walk test and shows significant decrease in function compared to age-matched norms including norms with patients who have had a stroke.Pt will continue to benefit from skilled physical therapy intervention to address impairments, improve QOL, and attain therapy goals.    OBJECTIVE IMPAIRMENTS: Abnormal gait, decreased balance, decreased coordination, decreased endurance, decreased mobility, difficulty walking, decreased strength, impaired perceived functional ability, and obesity.   ACTIVITY LIMITATIONS: carrying, lifting, standing, squatting, stairs, transfers, and locomotion level  PARTICIPATION LIMITATIONS: meal prep, driving, shopping, community activity, and yard work  PERSONAL FACTORS: Behavior pattern, Fitness, Time since onset of injury/illness/exacerbation, and 3+ comorbidities: CAD, CHF, DM, GERD, MI, HTN, HLD, obseity and 2 x CVA (10/24 and 3/25) are also affecting patient's functional outcome.   REHAB POTENTIAL: Good  CLINICAL DECISION MAKING: Evolving/moderate  complexity  EVALUATION COMPLEXITY: Moderate  PLAN:  PT FREQUENCY: 2x/week  PT DURATION: 12 weeks  PLANNED INTERVENTIONS: 97750- Physical Performance Testing, 97110-Therapeutic exercises, 97530- Therapeutic activity, 97112- Neuromuscular re-education, 97535- Self Care, [id number]- Manual therapy, [phone]- Gait training, Patient/Family education, Balance training, Stair training, Vestibular training, Visual/preceptual remediation/compensation, DME instructions, and Wheelchair mobility training  PLAN FOR NEXT SESSION: LE strength an balance focussed HEP, continue POC for balance, strength and functional mobility  Lite gait treadmill training for gait speed and endurance    Lonni KATHEE Gainer, PT 01/25/2024, 12:17 PM        "

## 2024-01-25 ENCOUNTER — Ambulatory Visit: Admitting: Physical Therapy

## 2024-01-25 DIAGNOSIS — R002 Palpitations: Secondary | ICD-10-CM | POA: Diagnosis not present

## 2024-01-25 DIAGNOSIS — M6281 Muscle weakness (generalized): Secondary | ICD-10-CM | POA: Diagnosis not present

## 2024-01-26 ENCOUNTER — Ambulatory Visit

## 2024-01-26 ENCOUNTER — Ambulatory Visit: Payer: Self-pay | Admitting: Physician Assistant

## 2024-01-26 DIAGNOSIS — I2510 Atherosclerotic heart disease of native coronary artery without angina pectoris: Secondary | ICD-10-CM | POA: Diagnosis not present

## 2024-01-26 DIAGNOSIS — R0602 Shortness of breath: Secondary | ICD-10-CM

## 2024-01-26 LAB — ECHOCARDIOGRAM COMPLETE
AR max vel: 3.19 cm2
AV Area VTI: 3.25 cm2
AV Area mean vel: 3.02 cm2
AV Mean grad: 5 mmHg
AV Peak grad: 9.6 mmHg
Ao pk vel: 1.55 m/s
Area-P 1/2: 3.99 cm2
S' Lateral: 4.1 cm

## 2024-01-26 MED ORDER — PERFLUTREN LIPID MICROSPHERE
1.0000 mL | INTRAVENOUS | Status: AC | PRN
  Administered 2024-01-26: 2 mL via INTRAVENOUS

## 2024-01-27 ENCOUNTER — Ambulatory Visit: Admitting: Physical Therapy

## 2024-01-27 DIAGNOSIS — R269 Unspecified abnormalities of gait and mobility: Secondary | ICD-10-CM

## 2024-01-27 DIAGNOSIS — R2689 Other abnormalities of gait and mobility: Secondary | ICD-10-CM

## 2024-01-27 DIAGNOSIS — M6281 Muscle weakness (generalized): Secondary | ICD-10-CM

## 2024-01-27 NOTE — Therapy (Signed)
 " OUTPATIENT PHYSICAL THERAPY NEURO TREATMENT   Patient Name: Gerald [name] MRN: [id number] DOB:[birth year], 58 y.o., male Today's Date: 01/27/2024   PCP: Kristina Tinnie POUR, PA-C  REFERRING PROVIDER: Lane Arthea BRAVO, MD   END OF SESSION:  PT End of Session - 01/27/24 0936     Visit Number 3    Number of Visits 24    Date for Recertification  04/13/24    Progress Note Due on Visit 10    PT Start Time 0932    PT Stop Time 1015    PT Time Calculation (min) 43 min            Past Medical History:  Diagnosis Date   Abnormal LFTs    Anxiety    Atypical chest pain    Borderline diabetes    CAD (coronary artery disease)    a.) PCI and stent placement (unknown type) to mLAD and mLCx; date unknown. b.) LHC 09/10/2010: 40% mLAD; no intervention. c.) LHC 09/12/2011: EF 60%; 30% ISR mLAD, 40% dLAD, 30% pLCx, 30% ISR mLCx, 40% mRCA; no intervention. d.) LHC 05/23/2014: EF >55%; 30% p-mLAD; no intervention. e.) LHC 06/11/2016: EF 55-65%; LVEDP norm; 30-40% p-mLAD; no intervention.   Carcinoma (HCC)    CHF (congestive heart failure) (HCC)    Chronic pain syndrome    Depression    Diabetes mellitus without complication (HCC)    Fundic gland polyps of stomach, benign    Gastritis    GERD (gastroesophageal reflux disease)    Hepatic steatosis    Hyperlipidemia    Hypertension    Migraines    Myocardial infarction [facility]) 2008   was told by PCP   Nonischemic cardiomyopathy (HCC)    a.) TTE 09/12/2011: EF 35-45%. b.) LHC 09/12/2011: EF 60%. c.) TTE 06/04/2012: EF 55-60%. d.) LHC 05/23/2014: EF >55%. e.) TTE 06/11/2016: EF 55-60%; G1DD. f.) LHC 06/11/2016: EF 55-65%. g.) TTE 01/08/2018: EF 60-65%.   Obesity    Occlusion and stenosis of bilateral carotid arteries    OSA on CPAP    Osteoarthritis of both knees    Shortness of breath    Squamous cell carcinoma of skin 02/27/2020   left distal lat deltoid - EDC   Stroke (HCC)    10/24, 3/25   Past Surgical History:   Procedure Laterality Date   BONE EXCISION Right 11/21/2020   Procedure: PART EXCISION BONE- PHALANX;  Surgeon: Ashley Soulier, DPM;  Location: [facility];  Service: Podiatry;  Laterality: Right;   CARDIAC CATHETERIZATION N/A 09/2011   [facility]; EF 50% with 30% mid LAD stenosis and no obstructive disease.   CARDIAC CATHETERIZATION  09/2010   [facility]; Mid LAD 40% stenosis; Mid Circumflex:Normal; Mid RCA; Normal   CARDIAC CATHETERIZATION     COLONOSCOPY     COLONOSCOPY WITH PROPOFOL  N/A 04/10/2021   Procedure: COLONOSCOPY WITH PROPOFOL ;  Surgeon: Therisa Bi, MD;  Location: [facility] ENDOSCOPY;  Service: Gastroenterology;  Laterality: N/A;   ESOPHAGOGASTRODUODENOSCOPY N/A 04/10/2021   Procedure: ESOPHAGOGASTRODUODENOSCOPY (EGD);  Surgeon: Therisa Bi, MD;  Location: [facility] ENDOSCOPY;  Service: Gastroenterology;  Laterality: N/A;   ESOPHAGOGASTRODUODENOSCOPY (EGD) WITH PROPOFOL  N/A 06/10/2017   Procedure: ESOPHAGOGASTRODUODENOSCOPY (EGD) WITH PROPOFOL ;  Surgeon: Therisa Bi, MD;  Location: [facility] ENDOSCOPY;  Service: Gastroenterology;  Laterality: N/A;   ESOPHAGOGASTRODUODENOSCOPY (EGD) WITH PROPOFOL  N/A 01/01/2022   Procedure: ESOPHAGOGASTRODUODENOSCOPY (EGD) WITH PROPOFOL ;  Surgeon: Therisa Bi, MD;  Location: [facility] ENDOSCOPY;  Service: Gastroenterology;  Laterality: N/A;   ESOPHAGOGASTRODUODENOSCOPY (EGD) WITH PROPOFOL  N/A 01/06/2022  Procedure: ESOPHAGOGASTRODUODENOSCOPY (EGD) WITH PROPOFOL ;  Surgeon: Therisa Bi, MD;  Location: [facility] ENDOSCOPY;  Service: Gastroenterology;  Laterality: N/A;   HAMMER TOE SURGERY Right 11/21/2020   Procedure: HAMMERTOE CORRECTION;  Surgeon: Ashley Soulier, DPM;  Location: [facility];  Service: Podiatry;  Laterality: Right;   INSERTION OF MESH  04/16/2021   Procedure: INSERTION OF MESH;  Surgeon: Desiderio Schanz, MD;  Location: [facility] ORS;  Service: General;;   KNEE ARTHROSCOPY WITH MEDIAL MENISECTOMY Right 09/16/2012   Procedure: RIGHT KNEE ARTHROSCOPY WITH MEDIAL AND  LATERAL MENISECTOMY, CHONDROPLASTY;  Surgeon: Toribio JULIANNA Chancy, MD;  Location: [facility];  Service: Orthopedics;  Laterality: Right;  RIGHT KNEE SCOPE MEDIAL MENISCECTOMY   LEFT HEART CATH AND CORONARY ANGIOGRAPHY N/A 06/11/2016   Procedure: Left Heart Cath and Coronary Angiography;  Surgeon: Perla Evalene PARAS, MD;  Location: [facility] INVASIVE CV LAB;  Service: Cardiovascular;  Laterality: N/A;   UMBILICAL HERNIA REPAIR N/A 04/16/2021   Procedure: HERNIA REPAIR UMBILICAL ADULT, open;  Surgeon: Desiderio Schanz, MD;  Location: [facility] ORS;  Service: General;  Laterality: N/A;   Patient Active Problem List   Diagnosis Date Noted   Stroke (HCC) 05/10/2023   CVA (cerebral vascular accident) (HCC) 04/16/2023   Acute CVA (cerebrovascular accident) (HCC) 11/10/2022   OSA (obstructive sleep apnea) 08/18/2022   CPAP use counseling 08/18/2022   Essential hypertension 08/18/2022   Insufficient sleep syndrome 08/18/2022   Melena 01/06/2022   Postoperative abdominal pain 04/24/2021   Mesenteric panniculitis (HCC) 04/24/2021   Orthopnea    Infection of superficial incisional surgical site after procedure    Non-recurrent bilateral inguinal hernia without obstruction or gangrene    Umbilical hernia without obstruction and without gangrene    Impingement syndrome of right shoulder region 11/07/2019   Chest pain 01/08/2018   Gastroesophageal reflux disease without esophagitis 08/23/2017   Urinary tract infection without hematuria 06/14/2017   Generalized anxiety disorder 02/02/2017   Abnormal results of liver function studies 02/02/2017   Circadian rhythm sleep disorder, shift work type 02/02/2017   Acne vulgaris 02/02/2017   Occlusion and stenosis of bilateral carotid arteries 02/02/2017   Neoplasm of uncertain behavior of skin 02/02/2017   Morbid obesity (HCC) 07/07/2016   Atypical chest pain 06/08/2016   Mass of jaw 05/22/2014   Retaining fluid 11/08/2013   Urinary incontinence 03/14/2013    Depression 01/13/2013   Type 2 diabetes mellitus (HCC) 09/14/2012   Nonischemic cardiomyopathy (HCC)    Mixed hyperlipidemia 05/28/2012   Chronic pain syndrome 03/30/2012   HTN (hypertension) 03/30/2012    ONSET DATE: 10/24  REFERRING DIAG: Imbalance  THERAPY DIAG:  Muscle weakness (generalized)  Gait difficulty  Balance disorder  Imbalance  Rationale for Evaluation and Treatment: Rehabilitation  SUBJECTIVE:  SUBJECTIVE STATEMENT:  Pt reports no updates since last session medically. Was sore form doing exercises at home yesterday. Explained this is good and indicates positive response to activities.   From Eval: Pt was in PT from march to August then insurance denied further session per his report. He mentioned this to his MD that he tries exercise at home but he has trouble and has lost his balance and fallen to the floor several times. He is having severe L sided weakness and occasional numbness. His hips occasionally give way causing imbalance. Pt also has difficulties eating due to grasping utensils. Pt has been having headaches as well that feel like pressure radiating form the back of his head near base of occiput. Getting up and down from the chair is very hard and walking is difficult as well. Doing things throughout the day is exhausting due to fatigue. Pt cannot complete a task without taking a break in between. Pt second stroke only exacerbated these symptoms. Pt still has sensation on the left side but it is dull and numb. Pt is very nervous stepping up on something like a curb or step.   Pt accompanied by: self  PERTINENT HISTORY:  From Eval:  Pt was in PT from march to August then insurance denied further session per his report. He mentioned this to his MD that he tries exercise at  home but he has trouble and has lost his balance and fallen to the floor several times. He is having severe L sided weakness and occasional numbness. His hips occasionally give way causing imbalance. Pt also has difficulties eating due to grasping utensils. Pt has been having headaches as well that feel like pressure radiating form the back of his head near base of occiput. Getting up and down from the chair is very hard and walking is difficult as well. Doing things throughout the day is exhausting due to fatigue. Pt cannot complete a task without taking a break in between. Pt second stroke only exacerbated these symptoms. Pt still has sensation on the left side but it is dull and numb. Pt is very nervous stepping up on something like a curb or step.   Pt was in PT from march to August then insurance denied further session per his report. He mentioned this to his MD that he tries exercise at home but he has trouble and has lost his balance and fallen to the floor several times. He is having severe L sided weakness and occasional numbness. His hips occasionally give way causing imbalance. Pt also has difficulties eating due to grasping utensils. Pt has been having headaches as well that feel like pressure radiating form the back of his head near base of occiput. Getting up and down from the chair is very hard and walking is difficult as well. Doing things throughout the day is exhausting due to fatigue. Pt cannot complete a task without taking a break in between. Pt second stroke only exacerbated these symptoms. Pt still has sensation on the left side but it is dull and numb. Pt is very nervous stepping up on something like a curb or step.   Pt with relevant history of CAD, CHF, DM, GERD, MI, HTN, HLD, obseity and 2 x CVA (10/24 and 3/25). Per las MD note from referral. Patient overall unchanged symptoms. No new recent stroke like symptoms. Unchanged residual left sided weakness and numbness from previous  strokes. Balance remains poor, no recent falls. Ambulates with a walker at all times.  Continues to have numbness in the right thigh when exercising. Unchanged daily severe headaches that start in the occipital region and radiate to the right frontal region. Describes pain as a throbbing, pressure sensation. Sleep continues to remain poor but have somewhat improved.  PAIN:  Are you having pain? No  PRECAUTIONS: Fall  RED FLAGS: None   WEIGHT BEARING RESTRICTIONS: No  FALLS: Has patient fallen in last 6 months? Yes. Number of falls 2  LIVING ENVIRONMENT: Lives with: lives with their family and lives with their spouse Lives in: House/apartment Stairs: No Has following equipment at home: Single point cane and Environmental Consultant - 2 wheeled  PLOF: Independent, Independent with basic ADLs, Independent with gait, and Independent with transfers  PATIENT GOALS: improve mobility, strength, and balance  OBJECTIVE:  Note: Objective measures were completed at Evaluation unless otherwise noted.  DIAGNOSTIC FINDINGS: Last MRI on 07/28/23 :IMPRESSION: 1. No acute intracranial abnormality. 2. Remote lacunar infarcts involving the left anterior genu of the corpus callosum, right lentiform nucleus, and right thalamus.  COGNITION: Overall cognitive status: Within functional limits for tasks assessed   SENSATION: Not tested Ipmaired on L side per report      POSTURE: rounded shoulder and flexed trunk posture when standing and using UE support on walker   LOWER EXTREMITY ROM:     WNL for tasks assessed   LOWER EXTREMITY MMT:    MMT Right Eval Left Eval  Hip flexion 4+ 3  Hip extension    Hip abduction 4+ 3+  Hip adduction 4+ 3  Hip internal rotation    Hip external rotation    Knee flexion 4+ 3+  Knee extension 4+ 3+  Ankle dorsiflexion    Ankle plantarflexion    Ankle inversion    Ankle eversion    (Blank rows = not tested)  BED MOBILITY:  More time and needs time to adjust.    TRANSFERS: Sit to stand: CGA  Assistive device utilized: Environmental Consultant - 2 wheeled     Stand to sit: CGA  Assistive device utilized: Environmental Consultant - 2 wheeled     Chair to chair: Modified independence and CGA  Assistive device utilized: Environmental Consultant - 2 wheeled      Floor to chair- excessive time and effort per report  RAMP:  Not tested  CURB:  Check future session   STAIRS: Check future session  GAIT: Findings: Distance walked: 10 meters and Comments: slow gait speed, poor awareness of LLE foot position   FUNCTIONAL TESTS:  5 times sit to stand: 63.92 sec - completes 2.5 reps with heavy UE assist  Timed up and go (TUG): 82.58 se with RW 10 meter walk test: 68.43 sec with RW   PATIENT SURVEYS:  Stroke Impact Scale   Question date:   Dress the top part of your body? 75  Bathe yourself? 50  Get to the toilet on time? 75  Control your bladder (not have an accident)? 75  Control your bowels (not have an accident)? 75  Stand without losing balance? 50  Go shopping? 25  Do heavy household chores (e.g., vacuum, laundry, yard work)? 25  Stay sitting without losing balance? 75  Walk without losing balance? 50  Move from a bed to a chair? 75  Walk fast? 0  Climb one flight of stairs? 0  Walk one block? 0  Get in and out of a car? 50  Carry heavy objects (e.g., bag of groceries) with your affected hand? 25  total out of 1600  725  total / [id number]                                                                                                                                01/27/2024   BP: 138/75  mmHg with HR 84  TA- To improve functional movements patterns for everyday tasks   Donned lite gait harness - LiteGait harness applied with patient in standing position. Straps secured snugly around torso and legs per manufacturer guidelines. Checked for proper alignment, comfort, and safety prior to standing and gait training. All buckles and fasteners verified secure; weight-bearing support  adjusted to patient tolerance   Gait training - activities focussed on specific components of gait cycle and multimodal cueing for completion  Lite gait .4-.5 MPH x 2 min  3 min rest Lite gait treadmill .5 - .6 MPH RPE 13 at end  3 min rest  Lite gait treadmill .6 - .7 MPH RPE 16 at end 3 min rest  Lite gait treadmill .6 - .7 MPH RPE 16 at end Stepped off lite gait treadmill with retro stepping then doffed straps for seated rest break   TA- To improve functional movements patterns for everyday tasks    -Doffed lite gait harness- LiteGait harness removed with patient in standing position. Due to increased abdominal pressure from harness, pt observed for dizziness post removal before transitioning to sitting for recovery.   - Cues and occasional minimal assistance and tactile cues for left foot clearance throughout.  Patient challenged with increased speed and will continue to progress in future sessions as patient tolerates  TE- To improve strength, endurance, mobility, and function of specific targeted muscle groups or improve joint range of motion or improve muscle flexibility  Seated HS curl x 10 ea LE with GTB - high difficulty on the L Pt required occasional rest breaks due fatigue, PT was attentive to when pt appeared to be tired or winded in order to prevent excessive fatigue.   PATIENT EDUCATION: Education details: POC Person educated: Patient Education method: Explanation Education comprehension: verbalized understanding   HOME EXERCISE PROGRAM: Access Code: [email or web address] URL: [email or web address] Date: 01/25/2024 Prepared by: Lonni Gainer  Exercises - Marching Near Counter  - 1 x daily - 4 x weekly - 2 sets - 10 reps - Sidestepping near counter   - 1 x daily - 4 x weekly - 2 sets - 10 reps - Mini Squat with Counter Support  - 1 x daily - 4 x weekly - 2 sets - 10 reps - Heel Raises with Counter Support  - 1 x daily - 4 x weekly - 2 sets - 10  reps   GOALS: Goals reviewed with patient? Yes  SHORT TERM GOALS: Target date: 02/17/2024       Patient will be independent in home exercise program to improve strength/mobility for better functional independence with ADLs. Baseline: No HEP currently  Goal status: INITIAL   LONG  TERM GOALS: Target date: 04/13/2024    1.  Patient will complete five times sit to stand test in < 15 seconds indicating an increased LE strength and improved balance. Baseline: 5 times sit to stand: 63.92 sec - completes 2.5 reps with heavy UE assist   Goal status: INITIAL  2.  Patient will improve SIS 16 score to 55  to demonstrate statistically significant improvement in mobility and limitations as it relates to their QOL impact as a result of his CVA.  Baseline: 45.3 Goal status: INITIAL     3.   Patient will reduce timed up and go to <30 seconds to reduce fall risk and demonstrate improved transfer/gait ability. Baseline:  82.58 se with RW  Goal status: INITIAL  4.   Patient will increase 10 meter walk test to >1.18m/s as to improve gait speed for better community ambulation and to reduce fall risk. Baseline:  68.43 sec with RW Goal status: INITIAL  5.   Patient will increase 2 minute walk test distance to >1000 for progression to community ambulator and improve gait ability Baseline: test visit 2  Goal status: INITIAL       ASSESSMENT:  CLINICAL IMPRESSION:  Patient arrived with good motivation for completion of pt activities.  Patient trialed with light gait harness and treadmill system today.  Patient showed good ability to adapt to cues from physical therapist for improved foot clearance as well as challenge of keeping up with treadmill but at increased speed's.  Will continue to complete this at future sessions as long as patient tolerates well.  Patient also reported he felt very comfortable and like gait harness and felt more comfortable pushing himself due to the safety it  gave him.  Of note patient also demonstrates having a weakness in his hamstrings during hamstring curl activity and this will also be another activity to help improve left foot clearance with gait based on his gait pattern on treadmill this date.  Pt will continue to benefit from skilled physical therapy intervention to address impairments, improve QOL, and attain therapy goals.    OBJECTIVE IMPAIRMENTS: Abnormal gait, decreased balance, decreased coordination, decreased endurance, decreased mobility, difficulty walking, decreased strength, impaired perceived functional ability, and obesity.   ACTIVITY LIMITATIONS: carrying, lifting, standing, squatting, stairs, transfers, and locomotion level  PARTICIPATION LIMITATIONS: meal prep, driving, shopping, community activity, and yard work  PERSONAL FACTORS: Behavior pattern, Fitness, Time since onset of injury/illness/exacerbation, and 3+ comorbidities: CAD, CHF, DM, GERD, MI, HTN, HLD, obseity and 2 x CVA (10/24 and 3/25) are also affecting patient's functional outcome.   REHAB POTENTIAL: Good  CLINICAL DECISION MAKING: Evolving/moderate complexity  EVALUATION COMPLEXITY: Moderate  PLAN:  PT FREQUENCY: 2x/week  PT DURATION: 12 weeks  PLANNED INTERVENTIONS: 97750- Physical Performance Testing, 97110-Therapeutic exercises, 97530- Therapeutic activity, 97112- Neuromuscular re-education, 97535- Self Care, [id number]- Manual therapy, [phone]- Gait training, Patient/Family education, Balance training, Stair training, Vestibular training, Visual/preceptual remediation/compensation, DME instructions, and Wheelchair mobility training  PLAN FOR NEXT SESSION: LE strength an balance focussed HEP, continue POC for balance, strength and functional mobility  Lite gait treadmill training for gait speed and endurance    Lonni KATHEE Gainer, PT 01/27/2024, 10:24 AM        "

## 2024-01-29 ENCOUNTER — Ambulatory Visit: Payer: Self-pay | Admitting: Physician Assistant

## 2024-01-30 ENCOUNTER — Other Ambulatory Visit: Payer: Self-pay | Admitting: Physician Assistant

## 2024-02-01 ENCOUNTER — Ambulatory Visit: Admitting: Physical Therapy

## 2024-02-01 DIAGNOSIS — M6281 Muscle weakness (generalized): Secondary | ICD-10-CM

## 2024-02-01 DIAGNOSIS — R2689 Other abnormalities of gait and mobility: Secondary | ICD-10-CM

## 2024-02-01 DIAGNOSIS — R269 Unspecified abnormalities of gait and mobility: Secondary | ICD-10-CM

## 2024-02-01 NOTE — Therapy (Signed)
 " OUTPATIENT PHYSICAL THERAPY NEURO TREATMENT   Patient Name: Gerald [name] MRN: [id number] DOB:02-08-1965, 58 y.o., male Today's Date: 02/01/2024   PCP: Kristina Tinnie POUR, PA-C  REFERRING PROVIDER: Lane Arthea BRAVO, MD   END OF SESSION:  PT End of Session - 02/01/24 1531     Visit Number 4    Number of Visits 24    Date for Recertification  04/13/24    Progress Note Due on Visit 10    PT Start Time 1317    PT Stop Time 1358    PT Time Calculation (min) 41 min    Equipment Utilized During Treatment Gait belt    Activity Tolerance Patient tolerated treatment well    Behavior During Therapy WFL for tasks assessed/performed             Past Medical History:  Diagnosis Date   Abnormal LFTs    Anxiety    Atypical chest pain    Borderline diabetes    CAD (coronary artery disease)    a.) PCI and stent placement (unknown type) to mLAD and mLCx; date unknown. b.) LHC 09/10/2010: 40% mLAD; no intervention. c.) LHC 09/12/2011: EF 60%; 30% ISR mLAD, 40% dLAD, 30% pLCx, 30% ISR mLCx, 40% mRCA; no intervention. d.) LHC 05/23/2014: EF >55%; 30% p-mLAD; no intervention. e.) LHC 06/11/2016: EF 55-65%; LVEDP norm; 30-40% p-mLAD; no intervention.   Carcinoma (HCC)    CHF (congestive heart failure) (HCC)    Chronic pain syndrome    Depression    Diabetes mellitus without complication (HCC)    Fundic gland polyps of stomach, benign    Gastritis    GERD (gastroesophageal reflux disease)    Hepatic steatosis    Hyperlipidemia    Hypertension    Migraines    Myocardial infarction [facility]) 2008   was told by PCP   Nonischemic cardiomyopathy (HCC)    a.) TTE 09/12/2011: EF 35-45%. b.) LHC 09/12/2011: EF 60%. c.) TTE 06/04/2012: EF 55-60%. d.) LHC 05/23/2014: EF >55%. e.) TTE 06/11/2016: EF 55-60%; G1DD. f.) LHC 06/11/2016: EF 55-65%. g.) TTE 01/08/2018: EF 60-65%.   Obesity    Occlusion and stenosis of bilateral carotid arteries    OSA on CPAP    Osteoarthritis of both knees     Shortness of breath    Squamous cell carcinoma of skin 02/27/2020   left distal lat deltoid - ED[facility]   Stroke (HCC)    10/24, 3/25   Past Surgical History:  Procedure Laterality Date   BONE EXCISION Right 11/21/2020   Procedure: PART EXCISION BONE- PHALANX;  Surgeon: Ashley Soulier, DPM;  Location: [facility];  Service: Podiatry;  Laterality: Right;   CARDIAC CATHETERIZATION N/A 09/2011   [facility]; EF 50% with 30% mid LAD stenosis and no obstructive disease.   CARDIAC CATHETERIZATION  09/2010   [facility]; Mid LAD 40% stenosis; Mid Circumflex:Normal; Mid RCA; Normal   CARDIAC CATHETERIZATION     COLONOSCOPY     COLONOSCOPY WITH PROPOFOL  N/A 04/10/2021   Procedure: COLONOSCOPY WITH PROPOFOL ;  Surgeon: Therisa Bi, MD;  Location: [facility] ENDOSCOPY;  Service: Gastroenterology;  Laterality: N/A;   ESOPHAGOGASTRODUODENOSCOPY N/A 04/10/2021   Procedure: ESOPHAGOGASTRODUODENOSCOPY (EGD);  Surgeon: Therisa Bi, MD;  Location: [facility] ENDOSCOPY;  Service: Gastroenterology;  Laterality: N/A;   ESOPHAGOGASTRODUODENOSCOPY (EGD) WITH PROPOFOL  N/A 06/10/2017   Procedure: ESOPHAGOGASTRODUODENOSCOPY (EGD) WITH PROPOFOL ;  Surgeon: Therisa Bi, MD;  Location: [facility] ENDOSCOPY;  Service: Gastroenterology;  Laterality: N/A;   ESOPHAGOGASTRODUODENOSCOPY (EGD) WITH PROPOFOL  N/A 01/01/2022  Procedure: ESOPHAGOGASTRODUODENOSCOPY (EGD) WITH PROPOFOL ;  Surgeon: Therisa Bi, MD;  Location: [facility] ENDOSCOPY;  Service: Gastroenterology;  Laterality: N/A;   ESOPHAGOGASTRODUODENOSCOPY (EGD) WITH PROPOFOL  N/A 01/06/2022   Procedure: ESOPHAGOGASTRODUODENOSCOPY (EGD) WITH PROPOFOL ;  Surgeon: Therisa Bi, MD;  Location: [facility] ENDOSCOPY;  Service: Gastroenterology;  Laterality: N/A;   HAMMER TOE SURGERY Right 11/21/2020   Procedure: HAMMERTOE CORRECTION;  Surgeon: Ashley Soulier, DPM;  Location: [facility];  Service: Podiatry;  Laterality: Right;   INSERTION OF MESH  04/16/2021   Procedure: INSERTION OF MESH;  Surgeon: Desiderio Schanz,  MD;  Location: [facility] ORS;  Service: General;;   KNEE ARTHROSCOPY WITH MEDIAL MENISECTOMY Right 09/16/2012   Procedure: RIGHT KNEE ARTHROSCOPY WITH MEDIAL AND LATERAL MENISECTOMY, CHONDROPLASTY;  Surgeon: Toribio JULIANNA Chancy, MD;  Location: [facility];  Service: Orthopedics;  Laterality: Right;  RIGHT KNEE SCOPE MEDIAL MENISCECTOMY   LEFT HEART CATH AND CORONARY ANGIOGRAPHY N/A 06/11/2016   Procedure: Left Heart Cath and Coronary Angiography;  Surgeon: Perla Evalene PARAS, MD;  Location: [facility] INV[facility]IVE [facility];  Service: Cardiovascular;  Laterality: N/A;   UMBILICAL HERNIA REPAIR N/A 04/16/2021   Procedure: HERNIA REPAIR UMBILICAL ADULT, open;  Surgeon: Desiderio Schanz, MD;  Location: [facility] ORS;  Service: General;  Laterality: N/A;   Patient Active Problem List   Diagnosis Date Noted   Stroke (HCC) 05/10/2023   CVA (cerebral vascular accident) (HCC) 04/16/2023   Acute CVA (cerebrovascular accident) (HCC) 11/10/2022   OSA (obstructive sleep apnea) 08/18/2022   CPAP use counseling 08/18/2022   Essential hypertension 08/18/2022   Insufficient sleep syndrome 08/18/2022   Melena 01/06/2022   Postoperative abdominal pain 04/24/2021   Mesenteric panniculitis (HCC) 04/24/2021   Orthopnea    Infection of superficial incisional surgical site after procedure    Non-recurrent bilateral inguinal hernia without obstruction or gangrene    Umbilical hernia without obstruction and without gangrene    Impingement syndrome of right shoulder region 11/07/2019   Chest pain 01/08/2018   Gastroesophageal reflux disease without esophagitis 08/23/2017   Urinary tract infection without hematuria 06/14/2017   Generalized anxiety disorder 02/02/2017   Abnormal results of liver function studies 02/02/2017   Circadian rhythm sleep disorder, shift work type 02/02/2017   Acne vulgaris 02/02/2017   Occlusion and stenosis of bilateral carotid arteries 02/02/2017   Neoplasm of uncertain behavior of skin 02/02/2017    Morbid obesity (HCC) 07/07/2016   Atypical chest pain 06/08/2016   Mass of jaw 05/22/2014   Retaining fluid 11/08/2013   Urinary incontinence 03/14/2013   Depression 01/13/2013   Type 2 diabetes mellitus (HCC) 09/14/2012   Nonischemic cardiomyopathy (HCC)    Mixed hyperlipidemia 05/28/2012   Chronic pain syndrome 03/30/2012   HTN (hypertension) 03/30/2012    ONSET DATE: 10/24  REFERRING DIAG: Imbalance  THERAPY DIAG:  Muscle weakness (generalized)  Gait difficulty  Balance disorder  Imbalance  Rationale for Evaluation and Treatment: Rehabilitation  SUBJECTIVE:  SUBJECTIVE STATEMENT:  Pt reports no updates since last session medically. Did have some significant soreness after last visit. Is recovered to baseline now though. Also had R side numbness in thigh ( noninvolved side) over the weekend but it has resolved.   From Eval: Pt was in PT from march to August then insurance denied further session per his report. He mentioned this to his MD that he tries exercise at home but he has trouble and has lost his balance and fallen to the floor several times. He is having severe L sided weakness and occasional numbness. His hips occasionally give way causing imbalance. Pt also has difficulties eating due to grasping utensils. Pt has been having headaches as well that feel like pressure radiating form the back of his head near base of occiput. Getting up and down from the chair is very hard and walking is difficult as well. Doing things throughout the day is exhausting due to fatigue. Pt cannot complete a task without taking a break in between. Pt second stroke only exacerbated these symptoms. Pt still has sensation on the left side but it is dull and numb. Pt is very nervous stepping up on something like a  curb or step.   Pt accompanied by: self  PERTINENT HISTORY:  From Eval:  Pt was in PT from march to August then insurance denied further session per his report. He mentioned this to his MD that he tries exercise at home but he has trouble and has lost his balance and fallen to the floor several times. He is having severe L sided weakness and occasional numbness. His hips occasionally give way causing imbalance. Pt also has difficulties eating due to grasping utensils. Pt has been having headaches as well that feel like pressure radiating form the back of his head near base of occiput. Getting up and down from the chair is very hard and walking is difficult as well. Doing things throughout the day is exhausting due to fatigue. Pt cannot complete a task without taking a break in between. Pt second stroke only exacerbated these symptoms. Pt still has sensation on the left side but it is dull and numb. Pt is very nervous stepping up on something like a curb or step.   Pt with relevant history of CAD, CHF, DM, GERD, MI, HTN, HLD, obseity and 2 x CVA (10/24 and 3/25). Per las MD note from referral. Patient overall unchanged symptoms. No new recent stroke like symptoms. Unchanged residual left sided weakness and numbness from previous strokes. Balance remains poor, no recent falls. Ambulates with a walker at all times. Continues to have numbness in the right thigh when exercising. Unchanged daily severe headaches that start in the occipital region and radiate to the right frontal region. Describes pain as a throbbing, pressure sensation. Sleep continues to remain poor but have somewhat improved.  PAIN:  Are you having pain? No  PRECAUTIONS: Fall  RED FLAGS: None   WEIGHT BEARING RESTRICTIONS: No  FALLS: Has patient fallen in last 6 months? Yes. Number of falls 2  LIVING ENVIRONMENT: Lives with: lives with their family and lives with their spouse Lives in: House/apartment Stairs: No Has following  equipment at home: Single point cane and Environmental Consultant - 2 wheeled  PLOF: Independent, Independent with basic ADLs, Independent with gait, and Independent with transfers  PATIENT GOALS: improve mobility, strength, and balance  OBJECTIVE:  Note: Objective measures were completed at Evaluation unless otherwise noted.  DIAGNOSTIC FINDINGS: Last MRI on 07/28/23 :  IMPRESSION: 1. No acute intracranial abnormality. 2. Remote lacunar infarcts involving the left anterior genu of the corpus callosum, right lentiform nucleus, and right thalamus.  COGNITION: Overall cognitive status: Within functional limits for tasks assessed   SENSATION: Not tested Ipmaired on L side per report      POSTURE: rounded shoulder and flexed trunk posture when standing and using UE support on walker   LOWER EXTREMITY ROM:     WNL for tasks assessed   LOWER EXTREMITY MMT:    MMT Right Eval Left Eval  Hip flexion 4+ 3  Hip extension    Hip abduction 4+ 3+  Hip adduction 4+ 3  Hip internal rotation    Hip external rotation    Knee flexion 4+ 3+  Knee extension 4+ 3+  Ankle dorsiflexion    Ankle plantarflexion    Ankle inversion    Ankle eversion    (Blank rows = not tested)  BED MOBILITY:  More time and needs time to adjust.   TRANSFERS: Sit to stand: CGA  Assistive device utilized: Environmental Consultant - 2 wheeled     Stand to sit: CGA  Assistive device utilized: Environmental Consultant - 2 wheeled     Chair to chair: Modified independence and CGA  Assistive device utilized: Environmental Consultant - 2 wheeled      Floor to chair- excessive time and effort per report  RAMP:  Not tested  CURB:  Check future session   STAIRS: Check future session  GAIT: Findings: Distance walked: 10 meters and Comments: slow gait speed, poor awareness of LLE foot position   FUNCTIONAL TESTS:  5 times sit to stand: 63.92 sec - completes 2.5 reps with heavy UE assist  Timed up and go (TUG): 82.58 se with RW 10 meter walk test: 68.43 sec with  RW   PATIENT SURVEYS:  Stroke Impact Scale   Question date:   Dress the top part of your body? 75  Bathe yourself? 50  Get to the toilet on time? 75  Control your bladder (not have an accident)? 75  Control your bowels (not have an accident)? 75  Stand without losing balance? 50  Go shopping? 25  Do heavy household chores (e.g., vacuum, laundry, yard work)? 25  Stay sitting without losing balance? 75  Walk without losing balance? 50  Move from a bed to a chair? 75  Walk fast? 0  Climb one flight of stairs? 0  Walk one block? 0  Get in and out of a car? 50  Carry heavy objects (e.g., bag of groceries) with your affected hand? 25  total out of 1600 725  total / 1600 [id number]                                                                                                                                02/01/2024  Isolates strength for HS, Glutes and quads  STS Gait with cues from last session - good  foot clearance   TA- To improve functional movements patterns for everyday tasks / TE- To improve strength, endurance, mobility, and function of specific targeted muscle groups or improve joint range of motion or improve muscle flexibility  STS from 25.5 in mat table x 5 no UE - walker support in standing  HS curl RTB x 10 ea LE  STS from 25.5 in mat table x 5 no UE HS curl RTB x 10 ea LE  STS from 25.5 in mat table x 5 no UE HS curl RTB x 10 ea LE   Gait forward and retro with walker x 10 ft ea - 1 instance of LLE giving way with post gait- cues for knee extension prior to bearing weight through ea LE with retro gait to correct form Seated heel raise on raised plinth x 15  Gait forward and retro with walker x 10 ft ea  Seated heel raise on raised plinth x 15  Gait forward and retro with walker x 10 ft ea  Seated heel raise on raised plinth x 15   Pt required occasional rest breaks due fatigue, PT was attentive to when pt appeared to be tired or winded in order to prevent  excessive fatigue.  Unless otherwise stated, CGA was provided and gait belt donned in order to ensure pt safety   PATIENT EDUCATION: Education details: POC Person educated: Patient Education method: Explanation Education comprehension: verbalized understanding   HOME EXERCISE PROGRAM: Access Code: [email or web address] URL: [email or web address] Date: 01/25/2024 Prepared by: Lonni Gainer  Exercises - Marching Near Counter  - 1 x daily - 4 x weekly - 2 sets - 10 reps - Sidestepping near counter   - 1 x daily - 4 x weekly - 2 sets - 10 reps - Mini Squat with Counter Support  - 1 x daily - 4 x weekly - 2 sets - 10 reps - Heel Raises with Counter Support  - 1 x daily - 4 x weekly - 2 sets - 10 reps   GOALS: Goals reviewed with patient? Yes  SHORT TERM GOALS: Target date: 02/17/2024       Patient will be independent in home exercise program to improve strength/mobility for better functional independence with ADLs. Baseline: No HEP currently  Goal status: INITIAL   LONG TERM GOALS: Target date: 04/13/2024    1.  Patient will complete five times sit to stand test in < 15 seconds indicating an increased LE strength and improved balance. Baseline: 5 times sit to stand: 63.92 sec - completes 2.5 reps with heavy UE assist   Goal status: INITIAL  2.  Patient will improve SIS 16 score to 55  to demonstrate statistically significant improvement in mobility and limitations as it relates to their QOL impact as a result of his CVA.  Baseline: 45.3 Goal status: INITIAL     3.   Patient will reduce timed up and go to <30 seconds to reduce fall risk and demonstrate improved transfer/gait ability. Baseline:  82.58 se with RW  Goal status: INITIAL  4.   Patient will increase 10 meter walk test to >1.48m/s as to improve gait speed for better community ambulation and to reduce fall risk. Baseline:  68.43 sec with RW Goal status: INITIAL  5.   Patient will increase 2 minute  walk test distance to >1000 for progression to community ambulator and improve gait ability Baseline: 75 ft with BRW - very fatigued  Goal status: INITIAL  ASSESSMENT:  CLINICAL IMPRESSION:  Patient arrived with good motivation for completion of pt activities.  Focussed on functional strength and isolated strength this date. Will move to lite gait on next treatment session for high intensity gait training. Pt very eager to improve and is working diligently at home with exercises.  Pt will continue to benefit from skilled physical therapy intervention to address impairments, improve QOL, and attain therapy goals.    OBJECTIVE IMPAIRMENTS: Abnormal gait, decreased balance, decreased coordination, decreased endurance, decreased mobility, difficulty walking, decreased strength, impaired perceived functional ability, and obesity.   ACTIVITY LIMITATIONS: carrying, lifting, standing, squatting, stairs, transfers, and locomotion level  PARTICIPATION LIMITATIONS: meal prep, driving, shopping, community activity, and yard work  PERSONAL FACTORS: Behavior pattern, Fitness, Time since onset of injury/illness/exacerbation, and 3+ comorbidities: CAD, CHF, DM, GERD, MI, HTN, HLD, obseity and 2 x CVA (10/24 and 3/25) are also affecting patient's functional outcome.   REHAB POTENTIAL: Good  CLINICAL DECISION MAKING: Evolving/moderate complexity  EVALUATION COMPLEXITY: Moderate  PLAN:  PT FREQUENCY: 2x/week  PT DURATION: 12 weeks  PLANNED INTERVENTIONS: 97750- Physical Performance Testing, 97110-Therapeutic exercises, 97530- Therapeutic activity, 97112- Neuromuscular re-education, 97535- Self Care, [id number]- Manual therapy, [phone]- Gait training, Patient/Family education, Balance training, Stair training, Vestibular training, Visual/preceptual remediation/compensation, DME instructions, and Wheelchair mobility training  PLAN FOR NEXT SESSION: LE strength an balance focussed HEP, continue POC for  balance, strength and functional mobility  Lite gait treadmill training for gait speed and endurance    Lonni KATHEE Gainer, PT 02/01/2024, 3:37 PM        "

## 2024-02-03 ENCOUNTER — Ambulatory Visit: Admitting: Physical Therapy

## 2024-02-03 DIAGNOSIS — M2560 Stiffness of unspecified joint, not elsewhere classified: Secondary | ICD-10-CM

## 2024-02-03 DIAGNOSIS — R2689 Other abnormalities of gait and mobility: Secondary | ICD-10-CM

## 2024-02-03 DIAGNOSIS — M6281 Muscle weakness (generalized): Secondary | ICD-10-CM

## 2024-02-03 DIAGNOSIS — R269 Unspecified abnormalities of gait and mobility: Secondary | ICD-10-CM

## 2024-02-03 NOTE — Therapy (Signed)
 " OUTPATIENT PHYSICAL THERAPY NEURO TREATMENT   Patient Name: Gerald [name] MRN: [id number] DOB:1965/04/20, 58 y.o., male Today's Date: 02/03/2024   PCP: Kristina Tinnie POUR, PA-C  REFERRING PROVIDER: Lane Arthea BRAVO, MD   END OF SESSION:  PT End of Session - 02/03/24 1148     Visit Number 5    Number of Visits 24    Date for Recertification  04/13/24    Progress Note Due on Visit 10    PT Start Time 1148    PT Stop Time 1230    PT Time Calculation (min) 42 min    Equipment Utilized During Treatment Gait belt    Activity Tolerance Patient tolerated treatment well    Behavior During Therapy WFL for tasks assessed/performed             Past Medical History:  Diagnosis Date   Abnormal LFTs    Anxiety    Atypical chest pain    Borderline diabetes    CAD (coronary artery disease)    a.) PCI and stent placement (unknown type) to mLAD and mLCx; date unknown. b.) LHC 09/10/2010: 40% mLAD; no intervention. c.) LHC 09/12/2011: EF 60%; 30% ISR mLAD, 40% dLAD, 30% pLCx, 30% ISR mLCx, 40% mRCA; no intervention. d.) LHC 05/23/2014: EF >55%; 30% p-mLAD; no intervention. e.) LHC 06/11/2016: EF 55-65%; LVEDP norm; 30-40% p-mLAD; no intervention.   Carcinoma (HCC)    CHF (congestive heart failure) (HCC)    Chronic pain syndrome    Depression    Diabetes mellitus without complication (HCC)    Fundic gland polyps of stomach, benign    Gastritis    GERD (gastroesophageal reflux disease)    Hepatic steatosis    Hyperlipidemia    Hypertension    Migraines    Myocardial infarction [facility]) 2008   was told by PCP   Nonischemic cardiomyopathy (HCC)    a.) TTE 09/12/2011: EF 35-45%. b.) LHC 09/12/2011: EF 60%. c.) TTE 06/04/2012: EF 55-60%. d.) LHC 05/23/2014: EF >55%. e.) TTE 06/11/2016: EF 55-60%; G1DD. f.) LHC 06/11/2016: EF 55-65%. g.) TTE 01/08/2018: EF 60-65%.   Obesity    Occlusion and stenosis of bilateral carotid arteries    OSA on CPAP    Osteoarthritis of both knees     Shortness of breath    Squamous cell carcinoma of skin 02/27/2020   left distal lat deltoid - ED[facility]   Stroke (HCC)    10/24, 3/25   Past Surgical History:  Procedure Laterality Date   BONE EXCISION Right 11/21/2020   Procedure: PART EXCISION BONE- PHALANX;  Surgeon: Ashley Soulier, DPM;  Location: [facility];  Service: Podiatry;  Laterality: Right;   CARDIAC CATHETERIZATION N/A 09/2011   [facility]; EF 50% with 30% mid LAD stenosis and no obstructive disease.   CARDIAC CATHETERIZATION  09/2010   [facility]; Mid LAD 40% stenosis; Mid Circumflex:Normal; Mid RCA; Normal   CARDIAC CATHETERIZATION     COLONOSCOPY     COLONOSCOPY WITH PROPOFOL  N/A 04/10/2021   Procedure: COLONOSCOPY WITH PROPOFOL ;  Surgeon: Therisa Bi, MD;  Location: [facility] ENDOSCOPY;  Service: Gastroenterology;  Laterality: N/A;   ESOPHAGOGASTRODUODENOSCOPY N/A 04/10/2021   Procedure: ESOPHAGOGASTRODUODENOSCOPY (EGD);  Surgeon: Therisa Bi, MD;  Location: [facility] ENDOSCOPY;  Service: Gastroenterology;  Laterality: N/A;   ESOPHAGOGASTRODUODENOSCOPY (EGD) WITH PROPOFOL  N/A 06/10/2017   Procedure: ESOPHAGOGASTRODUODENOSCOPY (EGD) WITH PROPOFOL ;  Surgeon: Therisa Bi, MD;  Location: [facility] ENDOSCOPY;  Service: Gastroenterology;  Laterality: N/A;   ESOPHAGOGASTRODUODENOSCOPY (EGD) WITH PROPOFOL  N/A 01/01/2022  Procedure: ESOPHAGOGASTRODUODENOSCOPY (EGD) WITH PROPOFOL ;  Surgeon: Therisa Bi, MD;  Location: [facility] ENDOSCOPY;  Service: Gastroenterology;  Laterality: N/A;   ESOPHAGOGASTRODUODENOSCOPY (EGD) WITH PROPOFOL  N/A 01/06/2022   Procedure: ESOPHAGOGASTRODUODENOSCOPY (EGD) WITH PROPOFOL ;  Surgeon: Therisa Bi, MD;  Location: [facility] ENDOSCOPY;  Service: Gastroenterology;  Laterality: N/A;   HAMMER TOE SURGERY Right 11/21/2020   Procedure: HAMMERTOE CORRECTION;  Surgeon: Ashley Soulier, DPM;  Location: [facility];  Service: Podiatry;  Laterality: Right;   INSERTION OF MESH  04/16/2021   Procedure: INSERTION OF MESH;  Surgeon: Desiderio Schanz,  MD;  Location: [facility] ORS;  Service: General;;   KNEE ARTHROSCOPY WITH MEDIAL MENISECTOMY Right 09/16/2012   Procedure: RIGHT KNEE ARTHROSCOPY WITH MEDIAL AND LATERAL MENISECTOMY, CHONDROPLASTY;  Surgeon: Toribio JULIANNA Chancy, MD;  Location: [facility];  Service: Orthopedics;  Laterality: Right;  RIGHT KNEE SCOPE MEDIAL MENISCECTOMY   LEFT HEART CATH AND CORONARY ANGIOGRAPHY N/A 06/11/2016   Procedure: Left Heart Cath and Coronary Angiography;  Surgeon: Perla Evalene PARAS, MD;  Location: [facility] INVASIVE CV LAB;  Service: Cardiovascular;  Laterality: N/A;   UMBILICAL HERNIA REPAIR N/A 04/16/2021   Procedure: HERNIA REPAIR UMBILICAL ADULT, open;  Surgeon: Desiderio Schanz, MD;  Location: [facility] ORS;  Service: General;  Laterality: N/A;   Patient Active Problem List   Diagnosis Date Noted   Stroke (HCC) 05/10/2023   CVA (cerebral vascular accident) (HCC) 04/16/2023   Acute CVA (cerebrovascular accident) (HCC) 11/10/2022   OSA (obstructive sleep apnea) 08/18/2022   CPAP use counseling 08/18/2022   Essential hypertension 08/18/2022   Insufficient sleep syndrome 08/18/2022   Melena 01/06/2022   Postoperative abdominal pain 04/24/2021   Mesenteric panniculitis (HCC) 04/24/2021   Orthopnea    Infection of superficial incisional surgical site after procedure    Non-recurrent bilateral inguinal hernia without obstruction or gangrene    Umbilical hernia without obstruction and without gangrene    Impingement syndrome of right shoulder region 11/07/2019   Chest pain 01/08/2018   Gastroesophageal reflux disease without esophagitis 08/23/2017   Urinary tract infection without hematuria 06/14/2017   Generalized anxiety disorder 02/02/2017   Abnormal results of liver function studies 02/02/2017   Circadian rhythm sleep disorder, shift work type 02/02/2017   Acne vulgaris 02/02/2017   Occlusion and stenosis of bilateral carotid arteries 02/02/2017   Neoplasm of uncertain behavior of skin 02/02/2017    Morbid obesity (HCC) 07/07/2016   Atypical chest pain 06/08/2016   Mass of jaw 05/22/2014   Retaining fluid 11/08/2013   Urinary incontinence 03/14/2013   Depression 01/13/2013   Type 2 diabetes mellitus (HCC) 09/14/2012   Nonischemic cardiomyopathy (HCC)    Mixed hyperlipidemia 05/28/2012   Chronic pain syndrome 03/30/2012   HTN (hypertension) 03/30/2012    ONSET DATE: 10/24  REFERRING DIAG: Imbalance  THERAPY DIAG:  Muscle weakness (generalized)  Gait difficulty  Balance disorder  Imbalance  Joint stiffness  Rationale for Evaluation and Treatment: Rehabilitation  SUBJECTIVE:  SUBJECTIVE STATEMENT: From Today  Pt reports that he is having increased neck tightness in upper neck near occiput. 7/10. As well as some general soreness.  Reports that neck pain and HA started getting worse last night. Has not taken any pain meds today because he didn't want to take meds and drive to therapy.    From Eval: Pt was in PT from march to August then insurance denied further session per his report. He mentioned this to his MD that he tries exercise at home but he has trouble and has lost his balance and fallen to the floor several times. He is having severe L sided weakness and occasional numbness. His hips occasionally give way causing imbalance. Pt also has difficulties eating due to grasping utensils. Pt has been having headaches as well that feel like pressure radiating form the back of his head near base of occiput. Getting up and down from the chair is very hard and walking is difficult as well. Doing things throughout the day is exhausting due to fatigue. Pt cannot complete a task without taking a break in between. Pt second stroke only exacerbated these symptoms. Pt still has sensation on the left  side but it is dull and numb. Pt is very nervous stepping up on something like a curb or step.   Pt accompanied by: self  PERTINENT HISTORY:  From Eval:  Pt was in PT from march to August then insurance denied further session per his report. He mentioned this to his MD that he tries exercise at home but he has trouble and has lost his balance and fallen to the floor several times. He is having severe L sided weakness and occasional numbness. His hips occasionally give way causing imbalance. Pt also has difficulties eating due to grasping utensils. Pt has been having headaches as well that feel like pressure radiating form the back of his head near base of occiput. Getting up and down from the chair is very hard and walking is difficult as well. Doing things throughout the day is exhausting due to fatigue. Pt cannot complete a task without taking a break in between. Pt second stroke only exacerbated these symptoms. Pt still has sensation on the left side but it is dull and numb. Pt is very nervous stepping up on something like a curb or step.   Pt with relevant history of CAD, CHF, DM, GERD, MI, HTN, HLD, obseity and 2 x CVA (10/24 and 3/25). Per las MD note from referral. Patient overall unchanged symptoms. No new recent stroke like symptoms. Unchanged residual left sided weakness and numbness from previous strokes. Balance remains poor, no recent falls. Ambulates with a walker at all times. Continues to have numbness in the right thigh when exercising. Unchanged daily severe headaches that start in the occipital region and radiate to the right frontal region. Describes pain as a throbbing, pressure sensation. Sleep continues to remain poor but have somewhat improved.  PAIN:  Are you having pain? No  PRECAUTIONS: Fall  RED FLAGS: None   WEIGHT BEARING RESTRICTIONS: No  FALLS: Has patient fallen in last 6 months? Yes. Number of falls 2  LIVING ENVIRONMENT: Lives with: lives with their family  and lives with their spouse Lives in: House/apartment Stairs: No Has following equipment at home: Single point cane and Environmental Consultant - 2 wheeled  PLOF: Independent, Independent with basic ADLs, Independent with gait, and Independent with transfers  PATIENT GOALS: improve mobility, strength, and balance  OBJECTIVE:  Note: Objective  measures were completed at Evaluation unless otherwise noted.  DIAGNOSTIC FINDINGS: Last MRI on 07/28/23 :IMPRESSION: 1. No acute intracranial abnormality. 2. Remote lacunar infarcts involving the left anterior genu of the corpus callosum, right lentiform nucleus, and right thalamus.  COGNITION: Overall cognitive status: Within functional limits for tasks assessed   SENSATION: Not tested Ipmaired on L side per report      POSTURE: rounded shoulder and flexed trunk posture when standing and using UE support on walker   LOWER EXTREMITY ROM:     WNL for tasks assessed   LOWER EXTREMITY MMT:    MMT Right Eval Left Eval  Hip flexion 4+ 3  Hip extension    Hip abduction 4+ 3+  Hip adduction 4+ 3  Hip internal rotation    Hip external rotation    Knee flexion 4+ 3+  Knee extension 4+ 3+  Ankle dorsiflexion    Ankle plantarflexion    Ankle inversion    Ankle eversion    (Blank rows = not tested)  BED MOBILITY:  More time and needs time to adjust.   TRANSFERS: Sit to stand: CGA  Assistive device utilized: Environmental Consultant - 2 wheeled     Stand to sit: CGA  Assistive device utilized: Environmental Consultant - 2 wheeled     Chair to chair: Modified independence and CGA  Assistive device utilized: Environmental Consultant - 2 wheeled      Floor to chair- excessive time and effort per report  RAMP:  Not tested  CURB:  Check future session   STAIRS: Check future session  GAIT: Findings: Distance walked: 10 meters and Comments: slow gait speed, poor awareness of LLE foot position   FUNCTIONAL TESTS:  5 times sit to stand: 63.92 sec - completes 2.5 reps with heavy UE assist  Timed  up and go (TUG): 82.58 se with RW 10 meter walk test: 68.43 sec with RW   PATIENT SURVEYS:  Stroke Impact Scale   Question date:   Dress the top part of your body? 75  Bathe yourself? 50  Get to the toilet on time? 75  Control your bladder (not have an accident)? 75  Control your bowels (not have an accident)? 75  Stand without losing balance? 50  Go shopping? 25  Do heavy household chores (e.g., vacuum, laundry, yard work)? 25  Stay sitting without losing balance? 75  Walk without losing balance? 50  Move from a bed to a chair? 75  Walk fast? 0  Climb one flight of stairs? 0  Walk one block? 0  Get in and out of a car? 50  Carry heavy objects (e.g., bag of groceries) with your affected hand? 25  total out of 1600 725  total / [phone]       PT TREATMENT:                                                                                                  02/03/2024   Stand pivot transfer to arm chair with airex pad in place to elevate seat. Gait with RW x 22ft. Short steps with poor  foot clearance throughout.   Sit<>stand from chair to don lite gait harness. Standing tolerance while donning harness. Gait in litegait to treadmill.  Step up with heavy UE support on handle.   BWSTT 3 min +1 min +2 min at 0.5 mph. Required multiple rest breaks due to BLE fatigue on this day with seated break after second bout. Total 249ft.   Step down off treadmill with reverse gait mild knee instability noted on the LLE.   Stand pivot transfer to transport chair with RW.     PATIENT EDUCATION: Education details: POC Person educated: Patient Education method: Explanation Education comprehension: verbalized understanding   HOME EXERCISE PROGRAM: Access Code: [email or web address] URL: [email or web address] Date: 01/25/2024 Prepared by: Lonni Gainer  Exercises - Marching Near Counter  - 1 x daily - 4 x weekly - 2 sets - 10 reps - Sidestepping near counter   - 1 x daily - 4  x weekly - 2 sets - 10 reps - Mini Squat with Counter Support  - 1 x daily - 4 x weekly - 2 sets - 10 reps - Heel Raises with Counter Support  - 1 x daily - 4 x weekly - 2 sets - 10 reps   GOALS: Goals reviewed with patient? Yes  SHORT TERM GOALS: Target date: 02/17/2024       Patient will be independent in home exercise program to improve strength/mobility for better functional independence with ADLs. Baseline: No HEP currently  Goal status: INITIAL   LONG TERM GOALS: Target date: 04/13/2024    1.  Patient will complete five times sit to stand test in < 15 seconds indicating an increased LE strength and improved balance. Baseline: 5 times sit to stand: 63.92 sec - completes 2.5 reps with heavy UE assist   Goal status: INITIAL  2.  Patient will improve SIS 16 score to 55  to demonstrate statistically significant improvement in mobility and limitations as it relates to their QOL impact as a result of his CVA.  Baseline: 45.3 Goal status: INITIAL     3.   Patient will reduce timed up and go to <30 seconds to reduce fall risk and demonstrate improved transfer/gait ability. Baseline:  82.58 se with RW  Goal status: INITIAL  4.   Patient will increase 10 meter walk test to >1.71m/s as to improve gait speed for better community ambulation and to reduce fall risk. Baseline:  68.43 sec with RW Goal status: INITIAL  5.   Patient will increase 2 minute walk test distance to >1000 for progression to community ambulator and improve gait ability Baseline: 75 ft with BRW - very fatigued  Goal status: INITIAL       ASSESSMENT:  CLINICAL IMPRESSION:  Patient arrived with good motivation for completion of pt activities.  Focussed on over ground and treadmill training on lite gait as tolerated. Limited on this day to <348ft on treadmill due to HA and BLE weakness per pt report.  Pt will continue to benefit from skilled physical therapy intervention to address impairments, improve  QOL, and attain therapy goals.    OBJECTIVE IMPAIRMENTS: Abnormal gait, decreased balance, decreased coordination, decreased endurance, decreased mobility, difficulty walking, decreased strength, impaired perceived functional ability, and obesity.   ACTIVITY LIMITATIONS: carrying, lifting, standing, squatting, stairs, transfers, and locomotion level  PARTICIPATION LIMITATIONS: meal prep, driving, shopping, community activity, and yard work  PERSONAL FACTORS: Behavior pattern, Fitness, Time since onset of injury/illness/exacerbation, and 3+ comorbidities: CAD, CHF, DM, GERD, MI, HTN, HLD, obseity  and 2 x CVA (10/24 and 3/25) are also affecting patient's functional outcome.   REHAB POTENTIAL: Good  CLINICAL DECISION MAKING: Evolving/moderate complexity  EVALUATION COMPLEXITY: Moderate  PLAN:  PT FREQUENCY: 2x/week  PT DURATION: 12 weeks  PLANNED INTERVENTIONS: 97750- Physical Performance Testing, 97110-Therapeutic exercises, 97530- Therapeutic activity, 97112- Neuromuscular re-education, 97535- Self Care, [id number]- Manual therapy, [phone]- Gait training, Patient/Family education, Balance training, Stair training, Vestibular training, Visual/preceptual remediation/compensation, DME instructions, and Wheelchair mobility training  PLAN FOR NEXT SESSION:   LE strength an balance focussed HEP, continue POC for balance, strength and functional mobility  Lite gait treadmill training for gait speed and endurance    Massie FORBES Dollar, PT 02/03/2024, 11:49 AM        "

## 2024-02-08 ENCOUNTER — Ambulatory Visit: Attending: Neurology | Admitting: Physical Therapy

## 2024-02-08 DIAGNOSIS — M2560 Stiffness of unspecified joint, not elsewhere classified: Secondary | ICD-10-CM | POA: Diagnosis present

## 2024-02-08 DIAGNOSIS — M6281 Muscle weakness (generalized): Secondary | ICD-10-CM | POA: Diagnosis present

## 2024-02-08 DIAGNOSIS — R269 Unspecified abnormalities of gait and mobility: Secondary | ICD-10-CM | POA: Insufficient documentation

## 2024-02-08 DIAGNOSIS — R2689 Other abnormalities of gait and mobility: Secondary | ICD-10-CM | POA: Diagnosis present

## 2024-02-08 NOTE — Therapy (Signed)
 " OUTPATIENT PHYSICAL THERAPY NEURO TREATMENT   Patient Name: Gerald [name] MRN: [id number] DOB:1965/02/06, 59 y.o., male Today's Date: 02/08/2024   PCP: Kristina Tinnie POUR, PA-C  REFERRING PROVIDER: Lane Arthea BRAVO, MD   END OF SESSION:  PT End of Session - 02/08/24 0949     Visit Number 6    Number of Visits 24    Date for Recertification  04/13/24    Progress Note Due on Visit 10    PT Start Time 0845    PT Stop Time 0930    PT Time Calculation (min) 45 min    Equipment Utilized During Treatment Gait belt    Activity Tolerance Patient tolerated treatment well    Behavior During Therapy WFL for tasks assessed/performed              Past Medical History:  Diagnosis Date   Abnormal LFTs    Anxiety    Atypical chest pain    Borderline diabetes    CAD (coronary artery disease)    a.) PCI and stent placement (unknown type) to mLAD and mLCx; date unknown. b.) LHC 09/10/2010: 40% mLAD; no intervention. c.) LHC 09/12/2011: EF 60%; 30% ISR mLAD, 40% dLAD, 30% pLCx, 30% ISR mLCx, 40% mRCA; no intervention. d.) LHC 05/23/2014: EF >55%; 30% p-mLAD; no intervention. e.) LHC 06/11/2016: EF 55-65%; LVEDP norm; 30-40% p-mLAD; no intervention.   Carcinoma (HCC)    CHF (congestive heart failure) (HCC)    Chronic pain syndrome    Depression    Diabetes mellitus without complication (HCC)    Fundic gland polyps of stomach, benign    Gastritis    GERD (gastroesophageal reflux disease)    Hepatic steatosis    Hyperlipidemia    Hypertension    Migraines    Myocardial infarction [facility]) 2008   was told by PCP   Nonischemic cardiomyopathy (HCC)    a.) TTE 09/12/2011: EF 35-45%. b.) LHC 09/12/2011: EF 60%. c.) TTE 06/04/2012: EF 55-60%. d.) LHC 05/23/2014: EF >55%. e.) TTE 06/11/2016: EF 55-60%; G1DD. f.) LHC 06/11/2016: EF 55-65%. g.) TTE 01/08/2018: EF 60-65%.   Obesity    Occlusion and stenosis of bilateral carotid arteries    OSA on CPAP    Osteoarthritis of both knees     Shortness of breath    Squamous cell carcinoma of skin 02/27/2020   left distal lat deltoid - [facility]   Stroke (HCC)    10/24, 3/25   Past Surgical History:  Procedure Laterality Date   BONE EXCISION Right 11/21/2020   Procedure: PART EXCISION BONE- PHALANX;  Surgeon: Ashley Soulier, DPM;  Location: [facility];  Service: Podiatry;  Laterality: Right;   CARDIAC CATHETERIZATION N/A 09/2011   [facility]; EF 50% with 30% mid LAD stenosis and no obstructive disease.   CARDIAC CATHETERIZATION  09/2010   [facility]; Mid LAD 40% stenosis; Mid Circumflex:Normal; Mid RCA; Normal   CARDIAC CATHETERIZATION     COLONOSCOPY     COLONOSCOPY WITH PROPOFOL  N/A 04/10/2021   Procedure: COLONOSCOPY WITH PROPOFOL ;  Surgeon: Therisa Bi, MD;  Location: [facility];  Service: Gastroenterology;  Laterality: N/A;   ESOPHAGOGASTRODUODENOSCOPY N/A 04/10/2021   Procedure: ESOPHAGOGASTRODUODENOSCOPY (EGD);  Surgeon: Therisa Bi, MD;  Location: [facility] ENDOSCOPY;  Service: Gastroenterology;  Laterality: N/A;   ESOPHAGOGASTRODUODENOSCOPY (EGD) WITH PROPOFOL  N/A 06/10/2017   Procedure: ESOPHAGOGASTRODUODENOSCOPY (EGD) WITH PROPOFOL ;  Surgeon: Therisa Bi, MD;  Location: [facility]COPY;  Service: Gastroenterology;  Laterality: N/A;   ESOPHAGOGASTRODUODENOSCOPY (EGD) WITH PROPOFOL  N/A 01/01/2022  Procedure: ESOPHAGOGASTRODUODENOSCOPY (EGD) WITH PROPOFOL ;  Surgeon: Therisa Bi, MD;  Location: [facility] END[facility]COPY;  Service: Gastroenterology;  Laterality: N/A;   ESOPHAGOGASTRODUODENOSCOPY (EGD) WITH PROPOFOL  N/A 01/06/2022   Procedure: ESOPHAGOGASTRODUODENOSCOPY (EGD) WITH PROPOFOL ;  Surgeon: Therisa Bi, MD;  Location: [facility] ENDOSCOPY;  Service: Gastroenterology;  Laterality: N/A;   HAMMER TOE SURGERY Right 11/21/2020   Procedure: HAMMERTOE CORRECTION;  Surgeon: Ashley Soulier, DPM;  Location: [facility];  Service: Podiatry;  Laterality: Right;   INSERTION OF MESH  04/16/2021   Procedure: INSERTION OF MESH;  Surgeon: Desiderio Schanz,  MD;  Location: [facility];  Service: General;;   KNEE ARTHROSCOPY WITH MEDIAL MENISECTOMY Right 09/16/2012   Procedure: RIGHT KNEE ARTHROSCOPY WITH MEDIAL AND LATERAL MENISECTOMY, CHONDROPLASTY;  Surgeon: Toribio JULIANNA Chancy, MD;  Location: [facility];  Service: Orthopedics;  Laterality: Right;  RIGHT KNEE SCOPE MEDIAL MENISCECTOMY   LEFT HEART CATH AND CORONARY ANGIOGRAPHY N/A 06/11/2016   Procedure: Left Heart Cath and Coronary Angiography;  Surgeon: Perla Evalene PARAS, MD;  Location: [facility];  Service: Cardiovascular;  Laterality: N/A;   UMBILICAL HERNIA REPAIR N/A 04/16/2021   Procedure: HERNIA REPAIR UMBILICAL ADULT, open;  Surgeon: Desiderio Schanz, MD;  Location: [facility] OR[facility];  Service: General;  Laterality: N/A;   Patient Active Problem List   Diagnosis Date Noted   Stroke (HCC) 05/10/2023   CVA (cerebral vascular accident) (HCC) 04/16/2023   Acute CVA (cerebrovascular accident) (HCC) 11/10/2022   OSA (obstructive sleep apnea) 08/18/2022   CPAP use counseling 08/18/2022   Essential hypertension 08/18/2022   Insufficient sleep syndrome 08/18/2022   Melena 01/06/2022   Postoperative abdominal pain 04/24/2021   Mesenteric panniculitis (HCC) 04/24/2021   Orthopnea    Infection of superficial incisional surgical site after procedure    Non-recurrent bilateral inguinal hernia without obstruction or gangrene    Umbilical hernia without obstruction and without gangrene    Impingement syndrome of right shoulder region 11/07/2019   Chest pain 01/08/2018   Gastroesophageal reflux disease without esophagitis 08/23/2017   Urinary tract infection without hematuria 06/14/2017   Generalized anxiety disorder 02/02/2017   Abnormal results of liver function studies 02/02/2017   Circadian rhythm sleep disorder, shift work type 02/02/2017   Acne vulgaris 02/02/2017   Occlusion and stenosis of bilateral carotid arteries 02/02/2017   Neoplasm of uncertain behavior of skin 02/02/2017    Morbid obesity (HCC) 07/07/2016   Atypical chest pain 06/08/2016   Mass of jaw 05/22/2014   Retaining fluid 11/08/2013   Urinary incontinence 03/14/2013   Depression 01/13/2013   Type 2 diabetes mellitus (HCC) 09/14/2012   Nonischemic cardiomyopathy (HCC)    Mixed hyperlipidemia 05/28/2012   Chronic pain syndrome 03/30/2012   HTN (hypertension) 03/30/2012    ONSET DATE: 10/24  REFERRING DIAG: Imbalance  THERAPY DIAG:  Muscle weakness (generalized)  Gait difficulty  Balance disorder  Imbalance  Rationale for Evaluation and Treatment: Rehabilitation  SUBJECTIVE:  SUBJECTIVE STATEMENT: From Today  Patient reports he had less soreness after last session and feels like he is feeling some improvements.  Patient's insurance did change and is now only approved for 12 visits over a 12-week period.  Discussed change in treatment schedule as well as change in plan for at home working provided new and updated home exercise program to complete some days a week but still continuing other home exercise program on other days of the week.   From Eval: Pt was in PT from march to August then insurance denied further session per his report. He mentioned this to his MD that he tries exercise at home but he has trouble and has lost his balance and fallen to the floor several times. He is having severe L sided weakness and occasional numbness. His hips occasionally give way causing imbalance. Pt also has difficulties eating due to grasping utensils. Pt has been having headaches as well that feel like pressure radiating form the back of his head near base of occiput. Getting up and down from the chair is very hard and walking is difficult as well. Doing things throughout the day is exhausting due to fatigue. Pt cannot  complete a task without taking a break in between. Pt second stroke only exacerbated these symptoms. Pt still has sensation on the left side but it is dull and numb. Pt is very nervous stepping up on something like a curb or step.   Pt accompanied by: self  PERTINENT HISTORY:  From Eval:  Pt was in PT from march to August then insurance denied further session per his report. He mentioned this to his MD that he tries exercise at home but he has trouble and has lost his balance and fallen to the floor several times. He is having severe L sided weakness and occasional numbness. His hips occasionally give way causing imbalance. Pt also has difficulties eating due to grasping utensils. Pt has been having headaches as well that feel like pressure radiating form the back of his head near base of occiput. Getting up and down from the chair is very hard and walking is difficult as well. Doing things throughout the day is exhausting due to fatigue. Pt cannot complete a task without taking a break in between. Pt second stroke only exacerbated these symptoms. Pt still has sensation on the left side but it is dull and numb. Pt is very nervous stepping up on something like a curb or step.   Pt with relevant history of CAD, CHF, DM, GERD, MI, HTN, HLD, obseity and 2 x CVA (10/24 and 3/25). Per las MD note from referral. Patient overall unchanged symptoms. No new recent stroke like symptoms. Unchanged residual left sided weakness and numbness from previous strokes. Balance remains poor, no recent falls. Ambulates with a walker at all times. Continues to have numbness in the right thigh when exercising. Unchanged daily severe headaches that start in the occipital region and radiate to the right frontal region. Describes pain as a throbbing, pressure sensation. Sleep continues to remain poor but have somewhat improved.  PAIN:  Are you having pain? No  PRECAUTIONS: Fall  RED FLAGS: None   WEIGHT BEARING  RESTRICTIONS: No  FALLS: Has patient fallen in last 6 months? Yes. Number of falls 2  LIVING ENVIRONMENT: Lives with: lives with their family and lives with their spouse Lives in: House/apartment Stairs: No Has following equipment at home: Single point cane and Environmental Consultant - 2 wheeled  PLOF: Independent,  Independent with basic ADLs, Independent with gait, and Independent with transfers  PATIENT GOALS: improve mobility, strength, and balance  OBJECTIVE:  Note: Objective measures were completed at Evaluation unless otherwise noted.  DIAGNOSTIC FINDINGS: Last MRI on 07/28/23 :IMPRESSION: 1. No acute intracranial abnormality. 2. Remote lacunar infarcts involving the left anterior genu of the corpus callosum, right lentiform nucleus, and right thalamus.  COGNITION: Overall cognitive status: Within functional limits for tasks assessed   SENSATION: Not tested Ipmaired on L side per report      POSTURE: rounded shoulder and flexed trunk posture when standing and using UE support on walker   LOWER EXTREMITY ROM:     WNL for tasks assessed   LOWER EXTREMITY MMT:    MMT Right Eval Left Eval  Hip flexion 4+ 3  Hip extension    Hip abduction 4+ 3+  Hip adduction 4+ 3  Hip internal rotation    Hip external rotation    Knee flexion 4+ 3+  Knee extension 4+ 3+  Ankle dorsiflexion    Ankle plantarflexion    Ankle inversion    Ankle eversion    (Blank rows = not tested)  BED MOBILITY:  More time and needs time to adjust.   TRANSFERS: Sit to stand: CGA  Assistive device utilized: Environmental Consultant - 2 wheeled     Stand to sit: CGA  Assistive device utilized: Environmental Consultant - 2 wheeled     Chair to chair: Modified independence and CGA  Assistive device utilized: Environmental Consultant - 2 wheeled      Floor to chair- excessive time and effort per report  RAMP:  Not tested  CURB:  Check future session   STAIRS: Check future session  GAIT: Findings: Distance walked: 10 meters and Comments: slow gait  speed, poor awareness of LLE foot position   FUNCTIONAL TESTS:  5 times sit to stand: 63.92 sec - completes 2.5 reps with heavy UE assist  Timed up and go (TUG): 82.58 se with RW 10 meter walk test: 68.43 sec with RW   PATIENT SURVEYS:  Stroke Impact Scale   Question date:   Dress the top part of your body? 75  Bathe yourself? 50  Get to the toilet on time? 75  Control your bladder (not have an accident)? 75  Control your bowels (not have an accident)? 75  Stand without losing balance? 50  Go shopping? 25  Do heavy household chores (e.g., vacuum, laundry, yard work)? 25  Stay sitting without losing balance? 75  Walk without losing balance? 50  Move from a bed to a chair? 75  Walk fast? 0  Climb one flight of stairs? 0  Walk one block? 0  Get in and out of a car? 50  Carry heavy objects (e.g., bag of groceries) with your affected hand? 25  total out of 1600 725  total / [phone]       PT TREATMENT:                                                                                                  02/08/2024  TA- To improve functional movements patterns for everyday tasks  and Gait training - activities focussed on specific components of gait cycle and multimodal cueing for completion And TE- To improve strength, endurance, mobility, and function of specific targeted muscle groups or improve joint range of motion or improve muscle flexibility  STS x 7 from raised mat table - arms across chest - very taxing  Gait with RW x 50 ft - cues for step and foot clearance Seated HS curl x 12 ea with GTB  Rest  STS x 7 from raised mat table - arms across chest - very taxing  Gait with RW x 50 ft  Seated HS curl x 12 ea with GTB   STS then Forward and retro gait x 10 ft with RW -0 cues for reciprocal stepping and straightening of knee to allow for improved stability 10 x heel raises with UE support tin standing  Rest  STS then Forward and retro gait x 10 ft with RW 10 x heel  raises with UE support tin standing  STS then Forward and retro gait x 10 ft with RW 10 x heel raises with UE support tin standing  - seated in transport chair at end of last set to end session.  - Improved retrograde reciprocal stepping on last round    PATIENT EDUCATION: Education details: POC Person educated: Patient Education method: Explanation Education comprehension: verbalized understanding   HOME EXERCISE PROGRAM: Access Code: [email or web address] URL: [email or web address] Date: 01/25/2024 Prepared by: Lonni Gainer  Exercises - Marching Near Counter  - 1 x daily - 4 x weekly - 2 sets - 10 reps - Sidestepping near counter   - 1 x daily - 4 x weekly - 2 sets - 10 reps - Mini Squat with Counter Support  - 1 x daily - 4 x weekly - 2 sets - 10 reps - Heel Raises with Counter Support  - 1 x daily - 4 x weekly - 2 sets - 10 reps  Access Code: [email or web address] URL: [email or web address] Date: 02/08/2024 Prepared by: Lonni Gainer  Exercises -Verbally added walking for about 50 feet with walker to beginning of this program but not on MedBridge - Sit to Stand with Arms Crossed  - 2 x weekly - 2 sets - 7 reps - Backwards Walking  - 1 x daily - 2 x weekly - 2 sets - 10 ft  hold - Heel Raises with Counter Support  - 1 x daily - 7 x weekly - 2 sets - 10 reps   GOALS: Goals reviewed with patient? Yes  SHORT TERM GOALS: Target date: 02/17/2024       Patient will be independent in home exercise program to improve strength/mobility for better functional independence with ADLs. Baseline: No HEP currently  Goal status: INITIAL   LONG TERM GOALS: Target date: 04/13/2024    1.  Patient will complete five times sit to stand test in < 15 seconds indicating an increased LE strength and improved balance. Baseline: 5 times sit to stand: 63.92 sec - completes 2.5 reps with heavy UE assist   Goal status: INITIAL  2.  Patient will improve SIS 16 score to 55  to  demonstrate statistically significant improvement in mobility and limitations as it relates to their QOL impact as a result of his CVA.  Baseline: 45.3 Goal status: INITIAL     3.   Patient will reduce timed up and go to <30 seconds to reduce fall risk and demonstrate improved transfer/gait  ability. Baseline:  82.58 se with RW  Goal status: INITIAL  4.   Patient will increase 10 meter walk test to >1.73m/s as to improve gait speed for better community ambulation and to reduce fall risk. Baseline:  68.43 sec with RW Goal status: INITIAL  5.   Patient will increase 2 minute walk test distance to >1000 for progression to community ambulator and improve gait ability Baseline: 75 ft with BRW - very fatigued  Goal status: INITIAL       ASSESSMENT:  CLINICAL IMPRESSION:  Patient arrived with good motivation for completion of pt activities.  Focused on functional movement training with particular focus on strengthening targeted muscle groups as well as improving ability to complete functional activities such as sit to stand and home navigation utilizing walker.  Patient showed improvement with gait with cues but did have significant fatigue in his hip flexors as well as tightness in his hip flexors with upright posture.  Patient also showed good improvement with sit to stands showing ability to complete from lower height to raise to mat table.  Due to patient's insurance change and limited visits approved through [facility] patient's treatment plan being altered to mostly light gait training on days he is in therapy and is able and patient completing program similar to what he did here today although and a modified and without formal instruction from physical therapist.  Patient provided with home exercise program to complete on his off day from physical therapy as well as to continue with his normal home exercise program on his normal days throughout the week.  Patient verbalized understanding of  plan and elected for removal of appointments later in the week to comply with 1 time a week over the next 12 weeks.  Pt will continue to benefit from skilled physical therapy intervention to address impairments, improve QOL, and attain therapy goals.    OBJECTIVE IMPAIRMENTS: Abnormal gait, decreased balance, decreased coordination, decreased endurance, decreased mobility, difficulty walking, decreased strength, impaired perceived functional ability, and obesity.   ACTIVITY LIMITATIONS: carrying, lifting, standing, squatting, stairs, transfers, and locomotion level  PARTICIPATION LIMITATIONS: meal prep, driving, shopping, community activity, and yard work  PERSONAL FACTORS: Behavior pattern, Fitness, Time since onset of injury/illness/exacerbation, and 3+ comorbidities: CAD, CHF, DM, GERD, MI, HTN, HLD, obseity and 2 x CVA (10/24 and 3/25) are also affecting patient's functional outcome.   REHAB POTENTIAL: Good  CLINICAL DECISION MAKING: Evolving/moderate complexity  EVALUATION COMPLEXITY: Moderate  PLAN:  PT FREQUENCY: 2x/week  PT DURATION: 12 weeks  PLANNED INTERVENTIONS: 97750- Physical Performance Testing, 97110-Therapeutic exercises, 97530- Therapeutic activity, 97112- Neuromuscular re-education, 97535- Self Care, [id number]- Manual therapy, [phone]- Gait training, Patient/Family education, Balance training, Stair training, Vestibular training, Visual/preceptual remediation/compensation, DME instructions, and Wheelchair mobility training  PLAN FOR NEXT SESSION:  Update HEP and iron out plan for day of week where he works at a similar effort to day he is in PT since insurance visit limit is limiting his treatment and progress!  Lite gait treadmill training for gait speed and endurance with functional strength intermingled    Lonni KATHEE Gainer, PT 02/08/2024, 9:49 AM        "

## 2024-02-11 ENCOUNTER — Ambulatory Visit: Admitting: Physical Therapy

## 2024-02-15 ENCOUNTER — Ambulatory Visit: Admitting: Physical Therapy

## 2024-02-15 DIAGNOSIS — R2689 Other abnormalities of gait and mobility: Secondary | ICD-10-CM

## 2024-02-15 DIAGNOSIS — M6281 Muscle weakness (generalized): Secondary | ICD-10-CM

## 2024-02-15 DIAGNOSIS — R269 Unspecified abnormalities of gait and mobility: Secondary | ICD-10-CM

## 2024-02-15 NOTE — Therapy (Signed)
 " OUTPATIENT PHYSICAL THERAPY NEURO TREATMENT   Patient Name: Gerald [name] MRN: [id number] DOB:05-12-1965, 59 y.o., male Today's Date: 02/15/2024   PCP: Kristina Tinnie POUR, PA-C  REFERRING PROVIDER: Lane Arthea BRAVO, MD   END OF SESSION:  PT End of Session - 02/15/24 0947     Visit Number 7    Number of Visits 24    Date for Recertification  04/13/24    Progress Note Due on Visit 10    PT Start Time 0846    PT Stop Time 0930    PT Time Calculation (min) 44 min    Equipment Utilized During Treatment Gait belt    Activity Tolerance Patient tolerated treatment well    Behavior During Therapy WFL for tasks assessed/performed               Past Medical History:  Diagnosis Date   Abnormal LFTs    Anxiety    Atypical chest pain    Borderline diabetes    CAD (coronary artery disease)    a.) PCI and stent placement (unknown type) to mLAD and mLCx; date unknown. b.) LHC 09/10/2010: 40% mLAD; no intervention. c.) LHC 09/12/2011: EF 60%; 30% ISR mLAD, 40% dLAD, 30% pLCx, 30% ISR mLCx, 40% mRCA; no intervention. d.) LHC 05/23/2014: EF >55%; 30% p-mLAD; no intervention. e.) LHC 06/11/2016: EF 55-65%; LVEDP norm; 30-40% p-mLAD; no intervention.   Carcinoma (HCC)    CHF (congestive heart failure) (HCC)    Chronic pain syndrome    Depression    Diabetes mellitus without complication (HCC)    Fundic gland polyps of stomach, benign    Gastritis    GERD (gastroesophageal reflux disease)    Hepatic steatosis    Hyperlipidemia    Hypertension    Migraines    Myocardial infarction [facility]) 2008   was told by PCP   Nonischemic cardiomyopathy (HCC)    a.) TTE 09/12/2011: EF 35-45%. b.) LHC 09/12/2011: EF 60%. c.) TTE 06/04/2012: EF 55-60%. d.) LHC 05/23/2014: EF >55%. e.) TTE 06/11/2016: EF 55-60%; G1DD. f.) LHC 06/11/2016: EF 55-65%. g.) TTE 01/08/2018: EF 60-65%.   Obesity    Occlusion and stenosis of bilateral carotid arteries    OSA on CPAP    Osteoarthritis of both  knees    Shortness of breath    Squamous cell carcinoma of skin 02/27/2020   left distal lat deltoid - [facility]   Stroke (HCC)    10/24, 3/25   Past Surgical History:  Procedure Laterality Date   BONE EXCISION Right 11/21/2020   Procedure: PART EXCISION BONE- PHALANX;  Surgeon: Ashley Soulier, DPM;  Location: [facility];  Service: Podiatry;  Laterality: Right;   CARDIAC CATHETERIZATION N/A 09/2011   [facility]; EF 50% with 30% mid LAD stenosis and no obstructive disease.   CARDIAC CATHETERIZATION  09/2010   [facility]; Mid LAD 40% stenosis; Mid Circumflex:Normal; Mid RCA; Normal   CARDIAC CATHETERIZATION     COLONOSCOPY     COLONOSCOPY WITH PROPOFOL  N/A 04/10/2021   Procedure: COLONOSCOPY WITH PROPOFOL ;  Surgeon: Therisa Bi, MD;  Location[facility];  Service: Gastroenterology;  Laterality: N/A;   ESOPHAGOGASTRODUODENOSCOPY N/A 04/10/2021   Procedure: ESOPHAGOGASTRODUODENOSCOPY (EGD);  Surgeon: Therisa Bi, MD;  Location: [facility] ENDOSCOPY;  Service: Gastroenterology;  Laterality: N/A;   ESOPHAGOGASTRODUODENOSCOPY (EGD) WITH PROPOFOL  N/A 06/10/2017   Procedure: ESOPHAGOGASTRODUODENOSCOPY (EGD) WITH PROPOFOL ;  Surgeon: Therisa Bi, MD;  Location: [facility] ENDOSCOPY;  Service: Gastroenterology;  Laterality: N/A;   ESOPHAGOGASTRODUODENOSCOPY (EGD) WITH PROPOFOL  N/A 01/01/2022  Procedure: ESOPHAGOGASTRODUODENOSCOPY (EGD) WITH PROPOFOL ;  Surgeon: Therisa Bi, MD;  Location: [facility] ENDOSCOPY;  Service: Gastroenterology;  Laterality: N/A;   ESOPHAGOGASTRODUODENOSCOPY (EGD) WITH PROPOFOL  N/A 01/06/2022   Procedure: ESOPHAGOGASTRODUODENOSCOPY (EGD) WITH PROPOFOL ;  Surgeon: Therisa Bi, MD;  Location: [facility];  Service: Gastroenterology;  Laterality: N/A;   HAMMER TOE SURGERY Right 11/21/2020   Procedure: HAMMERTOE CORRECTION;  Surgeon: Ashley Soulier, DPM;  Location: [facility];  Service: Podiatry;  Laterality: Right;   INSERTION OF MESH  04/16/2021   Procedure: INSERTION OF MESH;  Surgeon: Desiderio Schanz, MD;  Location: [facility] ORS;  Service: General;;   KNEE ARTHROSCOPY WITH MEDIAL MENISECTOMY Right 09/16/2012   Procedure: RIGHT KNEE ARTHROSCOPY WITH MEDIAL AND LATERAL MENISECTOMY, CHONDROPLASTY;  Surgeon: Toribio JULIANNA Chancy, MD;  Location: [facility];  Service: Orthopedics;  Laterality: Right;  RIGHT KNEE SCOPE MEDIAL MENISCECTOMY   LEFT HEART CATH AND CORONARY ANGIOGRAPHY N/A 06/11/2016   Procedure: Left Heart Cath and Coronary Angiography;  Surgeon: Perla Evalene PARAS, MD;  Location: [facility] INVASIVE CV LAB;  Service: Cardiovascular;  Laterality: N/A;   UMBILICAL HERNIA REPAIR N/A 04/16/2021   Procedure: HERNIA REPAIR UMBILICAL ADULT, open;  Surgeon: Desiderio Schanz, MD;  Location: [facility] ORS;  Service: General;  Laterality: N/A;   Patient Active Problem List   Diagnosis Date Noted   Stroke (HCC) 05/10/2023   CVA (cerebral vascular accident) (HCC) 04/16/2023   Acute CVA (cerebrovascular accident) (HCC) 11/10/2022   OSA (obstructive sleep apnea) 08/18/2022   CPAP use counseling 08/18/2022   Essential hypertension 08/18/2022   Insufficient sleep syndrome 08/18/2022   Melena 01/06/2022   Postoperative abdominal pain 04/24/2021   Mesenteric panniculitis (HCC) 04/24/2021   Orthopnea    Infection of superficial incisional surgical site after procedure    Non-recurrent bilateral inguinal hernia without obstruction or gangrene    Umbilical hernia without obstruction and without gangrene    Impingement syndrome of right shoulder region 11/07/2019   Chest pain 01/08/2018   Gastroesophageal reflux disease without esophagitis 08/23/2017   Urinary tract infection without hematuria 06/14/2017   Generalized anxiety disorder 02/02/2017   Abnormal results of liver function studies 02/02/2017   Circadian rhythm sleep disorder, shift work type 02/02/2017   Acne vulgaris 02/02/2017   Occlusion and stenosis of bilateral carotid arteries 02/02/2017   Neoplasm of uncertain behavior of skin 02/02/2017    Morbid obesity (HCC) 07/07/2016   Atypical chest pain 06/08/2016   Mass of jaw 05/22/2014   Retaining fluid 11/08/2013   Urinary incontinence 03/14/2013   Depression 01/13/2013   Type 2 diabetes mellitus (HCC) 09/14/2012   Nonischemic cardiomyopathy (HCC)    Mixed hyperlipidemia 05/28/2012   Chronic pain syndrome 03/30/2012   HTN (hypertension) 03/30/2012    ONSET DATE: 10/24  REFERRING DIAG: Imbalance  THERAPY DIAG:  Muscle weakness (generalized)  Gait difficulty  Balance disorder  Imbalance  Rationale for Evaluation and Treatment: Rehabilitation  SUBJECTIVE:  SUBJECTIVE STATEMENT: From Today  Patient reports he had less soreness after last session and feels like he is feeling some improvements.  Patient's insurance did change and is now only approved for 12 visits over a 12-week period.  Discussed change in treatment schedule as well as change in plan for at home working provided new and updated home exercise program to complete some days a week but still continuing other home exercise program on other days of the week.   From Eval: Pt was in PT from march to August then insurance denied further session per his report. He mentioned this to his MD that he tries exercise at home but he has trouble and has lost his balance and fallen to the floor several times. He is having severe L sided weakness and occasional numbness. His hips occasionally give way causing imbalance. Pt also has difficulties eating due to grasping utensils. Pt has been having headaches as well that feel like pressure radiating form the back of his head near base of occiput. Getting up and down from the chair is very hard and walking is difficult as well. Doing things throughout the day is exhausting due to fatigue. Pt cannot  complete a task without taking a break in between. Pt second stroke only exacerbated these symptoms. Pt still has sensation on the left side but it is dull and numb. Pt is very nervous stepping up on something like a curb or step.   Pt accompanied by: self  PERTINENT HISTORY:  From Eval:  Pt was in PT from march to August then insurance denied further session per his report. He mentioned this to his MD that he tries exercise at home but he has trouble and has lost his balance and fallen to the floor several times. He is having severe L sided weakness and occasional numbness. His hips occasionally give way causing imbalance. Pt also has difficulties eating due to grasping utensils. Pt has been having headaches as well that feel like pressure radiating form the back of his head near base of occiput. Getting up and down from the chair is very hard and walking is difficult as well. Doing things throughout the day is exhausting due to fatigue. Pt cannot complete a task without taking a break in between. Pt second stroke only exacerbated these symptoms. Pt still has sensation on the left side but it is dull and numb. Pt is very nervous stepping up on something like a curb or step.   Pt with relevant history of CAD, CHF, DM, GERD, MI, HTN, HLD, obseity and 2 x CVA (10/24 and 3/25). Per las MD note from referral. Patient overall unchanged symptoms. No new recent stroke like symptoms. Unchanged residual left sided weakness and numbness from previous strokes. Balance remains poor, no recent falls. Ambulates with a walker at all times. Continues to have numbness in the right thigh when exercising. Unchanged daily severe headaches that start in the occipital region and radiate to the right frontal region. Describes pain as a throbbing, pressure sensation. Sleep continues to remain poor but have somewhat improved.  PAIN:  Are you having pain? No  PRECAUTIONS: Fall  RED FLAGS: None   WEIGHT BEARING  RESTRICTIONS: No  FALLS: Has patient fallen in last 6 months? Yes. Number of falls 2  LIVING ENVIRONMENT: Lives with: lives with their family and lives with their spouse Lives in: House/apartment Stairs: No Has following equipment at home: Single point cane and Environmental Consultant - 2 wheeled  PLOF: Independent,  Independent with basic ADLs, Independent with gait, and Independent with transfers  PATIENT GOALS: improve mobility, strength, and balance  OBJECTIVE:  Note: Objective measures were completed at Evaluation unless otherwise noted.  DIAGNOSTIC FINDINGS: Last MRI on 07/28/23 :IMPRESSION: 1. No acute intracranial abnormality. 2. Remote lacunar infarcts involving the left anterior genu of the corpus callosum, right lentiform nucleus, and right thalamus.  COGNITION: Overall cognitive status: Within functional limits for tasks assessed   SENSATION: Not tested Ipmaired on L side per report      POSTURE: rounded shoulder and flexed trunk posture when standing and using UE support on walker   LOWER EXTREMITY ROM:     WNL for tasks assessed   LOWER EXTREMITY MMT:    MMT Right Eval Left Eval  Hip flexion 4+ 3  Hip extension    Hip abduction 4+ 3+  Hip adduction 4+ 3  Hip internal rotation    Hip external rotation    Knee flexion 4+ 3+  Knee extension 4+ 3+  Ankle dorsiflexion    Ankle plantarflexion    Ankle inversion    Ankle eversion    (Blank rows = not tested)  BED MOBILITY:  More time and needs time to adjust.   TRANSFERS: Sit to stand: CGA  Assistive device utilized: Environmental Consultant - 2 wheeled     Stand to sit: CGA  Assistive device utilized: Environmental Consultant - 2 wheeled     Chair to chair: Modified independence and CGA  Assistive device utilized: Environmental Consultant - 2 wheeled      Floor to chair- excessive time and effort per report  RAMP:  Not tested  CURB:  Check future session   STAIRS: Check future session  GAIT: Findings: Distance walked: 10 meters and Comments: slow gait  speed, poor awareness of LLE foot position   FUNCTIONAL TESTS:  5 times sit to stand: 63.92 sec - completes 2.5 reps with heavy UE assist  Timed up and go (TUG): 82.58 se with RW 10 meter walk test: 68.43 sec with RW   PATIENT SURVEYS:  Stroke Impact Scale   Question date:   Dress the top part of your body? 75  Bathe yourself? 50  Get to the toilet on time? 75  Control your bladder (not have an accident)? 75  Control your bowels (not have an accident)? 75  Stand without losing balance? 50  Go shopping? 25  Do heavy household chores (e.g., vacuum, laundry, yard work)? 25  Stay sitting without losing balance? 75  Walk without losing balance? 50  Move from a bed to a chair? 75  Walk fast? 0  Climb one flight of stairs? 0  Walk one block? 0  Get in and out of a car? 50  Carry heavy objects (e.g., bag of groceries) with your affected hand? 25  total out of 1600 725  total / [id number]       PT TREATMENT:                                                                                                  02/15/2024  TA- To improve functional movements patterns for everyday tasks   Donned lite gait harness - LiteGait harness applied with patient in standing position. Straps secured snugly around torso and legs per manufacturer guidelines. Checked for proper alignment, comfort, and safety prior to standing and gait training. All buckles and fasteners verified secure; weight-bearing support adjusted to patient tolerance   Stepped onto treadmill using R LE in lite gait system then   Gait training - activities focussed on specific components of gait cycle and multimodal cueing for completion  3 min of gait with cues for weight shift and step length and knee extension in stance phase -  x. x 2 min . x 1 min  2 min rest  . x 3 min same cues as above - improve weight shift and foot clearance noted  2 min rest  . x 3 min same cues as above - improve weight shift and  foot clearance noted  2 min rest  Practice turning - unlocked transverse plane movement on lite gait and performed 360 deg turn x 1 turn ea direction did not use UE support !  2 min rest  . x 3 min same cues as above - improve weight shift and foot clearance noted  Stepped off of tredamill backwards   Doffed lite gait harness- LiteGait harness removed with patient in standing position. Due to increased abdominal pressure from harness, pt observed for dizziness post removal before transitioning to sitting for recovery.    Unless otherwise stated, SBA was provided and gait belt donned in order to ensure pt safety  Pt required occasional rest breaks due fatigue, PT was attentive to when pt appeared to be tired or winded in order to prevent excessive fatigue.   PATIENT EDUCATION: Education details: POC Person educated: Patient Education method: Explanation Education comprehension: verbalized understanding   HOME EXERCISE PROGRAM: Access Code: [email or web address] URL: [email or web address] Date: 01/25/2024 Prepared by: Lonni Gainer  Exercises - Marching Near Counter  - 1 x daily - 4 x weekly - 2 sets - 10 reps - Sidestepping near counter   - 1 x daily - 4 x weekly - 2 sets - 10 reps - Mini Squat with Counter Support  - 1 x daily - 4 x weekly - 2 sets - 10 reps - Heel Raises with Counter Support  - 1 x daily - 4 x weekly - 2 sets - 10 reps  Access Code: [email or web address] URL: [email or web address] Date: 02/08/2024 Prepared by: Lonni Gainer  Exercises -Verbally added walking for about 50 feet with walker to beginning of this program but not on MedBridge - Sit to Stand with Arms Crossed  - 2 x weekly - 2 sets - 7 reps - Backwards Walking  - 1 x daily - 2 x weekly - 2 sets - 10 ft  hold - Heel Raises with Counter Support  - 1 x daily - 7 x weekly - 2 sets - 10 reps   GOALS: Goals reviewed with patient? Yes  SHORT TERM GOALS: Target date: 02/17/2024        Patient will be independent in home exercise program to improve strength/mobility for better functional independence with ADLs. Baseline: No HEP currently  Goal status: INITIAL   LONG TERM GOALS: Target date: 04/13/2024    1.  Patient will complete five times sit to stand test in < 15 seconds indicating an increased LE strength and improved balance. Baseline: 5 times sit to stand: 63.92 sec - completes 2.5 reps with heavy  UE assist   Goal status: INITIAL  2.  Patient will improve SIS 16 score to 55  to demonstrate statistically significant improvement in mobility and limitations as it relates to their QOL impact as a result of his CVA.  Baseline: 45.3 Goal status: INITIAL     3.   Patient will reduce timed up and go to <30 seconds to reduce fall risk and demonstrate improved transfer/gait ability. Baseline:  82.58 se with RW  Goal status: INITIAL  4.   Patient will increase 10 meter walk test to >1.17m/s as to improve gait speed for better community ambulation and to reduce fall risk. Baseline:  68.43 sec with RW Goal status: INITIAL  5.   Patient will increase 2 minute walk test distance to >1000 for progression to community ambulator and improve gait ability Baseline: 75 ft with BRW - very fatigued  Goal status: INITIAL       ASSESSMENT:  CLINICAL IMPRESSION:  Patient arrived with good motivation for completion of pt activities.  Pt focussed on training with lite gait as well and will continue in future sessions. Pt also continuing to complete exercises at home at high intensities as he is able to do safely. Pt showed improved foot clearance, gait speed, endurance, and ability to adapt to cues for gait with lite gait training.  Pt will continue to benefit from skilled physical therapy intervention to address impairments, improve QOL, and attain therapy goals.    OBJECTIVE IMPAIRMENTS: Abnormal gait, decreased balance, decreased coordination, decreased endurance,  decreased mobility, difficulty walking, decreased strength, impaired perceived functional ability, and obesity.   ACTIVITY LIMITATIONS: carrying, lifting, standing, squatting, stairs, transfers, and locomotion level  PARTICIPATION LIMITATIONS: meal prep, driving, shopping, community activity, and yard work  PERSONAL FACTORS: Behavior pattern, Fitness, Time since onset of injury/illness/exacerbation, and 3+ comorbidities: CAD, CHF, DM, GERD, MI, HTN, HLD, obseity and 2 x CVA (10/24 and 3/25) are also affecting patient's functional outcome.   REHAB POTENTIAL: Good  CLINICAL DECISION MAKING: Evolving/moderate complexity  EVALUATION COMPLEXITY: Moderate  PLAN:  PT FREQUENCY: 2x/week  PT DURATION: 12 weeks  PLANNED INTERVENTIONS: 97750- Physical Performance Testing, 97110-Therapeutic exercises, 97530- Therapeutic activity, 97112- Neuromuscular re-education, 97535- Self Care, [id number]- Manual therapy, [phone]- Gait training, Patient/Family education, Balance training, Stair training, Vestibular training, Visual/preceptual remediation/compensation, DME instructions, and Wheelchair mobility training  PLAN FOR NEXT SESSION:  Update HEP and iron out plan for day of week where he works at a similar effort to day he is in PT since insurance visit limit is limiting his treatment and progress!  Lite gait treadmill training for gait speed and endurance with functional strength intermingled    Lonni KATHEE Gainer, PT 02/15/2024, 9:48 AM        "

## 2024-02-19 ENCOUNTER — Ambulatory Visit: Admitting: Physical Therapy

## 2024-02-24 ENCOUNTER — Ambulatory Visit: Admitting: Physical Therapy

## 2024-02-24 DIAGNOSIS — R2689 Other abnormalities of gait and mobility: Secondary | ICD-10-CM

## 2024-02-24 DIAGNOSIS — M6281 Muscle weakness (generalized): Secondary | ICD-10-CM | POA: Diagnosis not present

## 2024-02-24 DIAGNOSIS — R269 Unspecified abnormalities of gait and mobility: Secondary | ICD-10-CM

## 2024-02-24 NOTE — Progress Notes (Unsigned)
 "  Cardiology Office Note    Date:  02/26/2024   ID:  Gerald, [name] October 01, 1965, MRN [id number]  PCP:  Kristina Tinnie POUR, PA-C  Cardiologist:  Redell Cave, MD  Electrophysiologist:  None   Chief Complaint: Follow up  History of Present Illness:   Gerald [name] is a 59 y.o. male with history of  nonobstructive CAD, hyperlipidemia, CVA x 2 (11/2022, 04/2023) who presents for follow up after testing.    Patient initially followed with Dr. Deretha and had cardiac catheterization done in August 2012 in August 2013 with atypical chest pain.  Both showed nonobstructive disease.  He reportedly had reduced ejection fraction with EF 35 to 40% in 2013.  By cardiac cath, ejection fraction was 50%.  Repeat cardiac catheterization in 2016 again showed nonobstructive CAD with 30% LAD stenosis.  He had a cath again in 2018 which showed stable 30% mid LAD stenosis with normal EF.  Patient reestablished with our practice 04/2022 and was seen by Dr. Cave.  Echo 04/2022 showed normal LV systolic function with no significant valvular abnormalities.  Lexiscan  Myoview  05/2022 was overall low risk with no evidence of ischemia or infarction.  Echo 11/2022 showed EF 50 to 55% with G1 DD and trivial MR.  Coronary CTA 05/2023 showed calcium  score of 23.3 which was 58th percentile for age and sex matched control.  Minimal mid LAD stenosis was noted (less than 25%).  ZIO monitor ordered due to CVA 05/2023 revealed predominantly sinus rhythm with rare PACs and frequent PVCs at 5.4% burden.  No evidence of atrial fibrillation or flutter and no sustained arrhythmias.    Patient was most recently seen by myself in clinic 12/25/2023 reporting approximately 1 month history of episodes of feelings of heart racing associated with shortness of breath and breaking into a sweat.  This was noted to occur with minimal activity such as walking a short distance or feeding himself.  These episodes occurred several  times per week making him feel exhausted after.  He reported taking his vital signs during these episodes and noting elevated blood pressure and heart rate.  He notes occasional elevated heart rate even at rest at 105 to 110 bpm.  He had 1 episode during which his chest felt tight and blood pressure was very elevated at that time.  He did take a sublingual nitroglycerin  with improvement in blood pressure and chest discomfort.  Denied exertional chest pain.  Very limited in activity at baseline due to left-sided weakness from prior stroke.  He was started on metoprolol  succinate 25 mg daily.  ZIO revealed predominantly sinus rhythm with occasional PVCs at a 3.3% burden with no evidence of atrial fibrillation or flutter.  Patient triggered events were associated with occasional PVCs. Echo 01/2024 revealed EF 55 to 60% with no RWMA, normal diastolic parameters, and no significant valvular abnormalities.  Patient presents today with overall symptomatic improvement since starting metoprolol .  He does continue to have intermittent episodes of palpitations with associated shortness of breath.  Symptoms were associated with PVCs on recent monitor.  He denies exertional chest pain and dyspnea.  BP is noted to be 110/68 today, this is much lower than his readings at home.  He denies lightheadedness and dizziness.  Labs independently reviewed: 12/2022-Hgb 15.8, HCT 48.6, platelets 262, BUN 13, creatinine 1.07, sodium 139, potassium 4.1 10/2023-normal LFTs 05/2023-TC 155, TG 222, HDL 35, LDL 76  Objective   Past Medical History:  Diagnosis Date   Abnormal LFTs  Anxiety    Atypical chest pain    Borderline diabetes    CAD (coronary artery disease)    a.) PCI and stent placement (unknown type) to mLAD and mLCx; date unknown. b.) LHC 09/10/2010: 40% mLAD; no intervention. c.) LHC 09/12/2011: EF 60%; 30% ISR mLAD, 40% dLAD, 30% pLCx, 30% ISR mLCx, 40% mRCA; no intervention. d.) LHC 05/23/2014: EF >55%; 30% p-mLAD;  no intervention. e.) LHC 06/11/2016: EF 55-65%; LVEDP norm; 30-40% p-mLAD; no intervention.   Carcinoma (HCC)    CHF (congestive heart failure) (HCC)    Chronic pain syndrome    Depression    Diabetes mellitus without complication (HCC)    Fundic gland polyps of stomach, benign    Gastritis    GERD (gastroesophageal reflux disease)    Hepatic steatosis    Hyperlipidemia    Hypertension    Migraines    Myocardial infarction [facility]) 2008   was told by PCP   Nonischemic cardiomyopathy (HCC)    a.) TTE 09/12/2011: EF 35-45%. b.) LHC 09/12/2011: EF 60%. c.) TTE 06/04/2012: EF 55-60%. d.) LHC 05/23/2014: EF >55%. e.) TTE 06/11/2016: EF 55-60%; G1DD. f.) LHC 06/11/2016: EF 55-65%. g.) TTE 01/08/2018: EF 60-65%.   Obesity    Occlusion and stenosis of bilateral carotid arteries    OSA on CPAP    Osteoarthritis of both knees    Shortness of breath    Squamous cell carcinoma of skin 02/27/2020   left distal lat deltoid - EDC   Stroke (HCC)    10/24, 3/25    Current Medications: Active Medications[1]  Allergies:   Nexlizet  [bempedoic acid-ezetimibe ] and Statins   Social History   Socioeconomic History   Marital status: Married    Spouse name: Not on file   Number of children: 3   Years of education: Not on file   Highest education level: Not on file  Occupational History   Occupation: Camera Operator  Tobacco Use   Smoking status: Never    Passive exposure: Past   Smokeless tobacco: Former    Types: Chew    Quit date: 1987  Vaping Use   Vaping status: Never Used  Substance and Sexual Activity   Alcohol use: Not Currently   Drug use: No   Sexual activity: Yes  Other Topics Concern   Not on file  Social History Narrative   Married.  59 yo son.   Wife on disability for bipolar disorder.     Social Drivers of Health   Tobacco Use: Medium Risk (02/26/2024)   Patient History    Smoking Tobacco Use: Never    Smokeless Tobacco Use: Former    Passive Exposure: Past   Physicist, Medical Strain: Not on file  Food Insecurity: Food Insecurity Present (04/17/2023)   Hunger Vital Sign    Worried About Running Out of Food in the Last Year: Sometimes true    Ran Out of Food in the Last Year: Sometimes true  Transportation Needs: No Transportation Needs (04/17/2023)   PRAPARE - Administrator, Civil Service (Medical): No    Lack of Transportation (Non-Medical): No  Physical Activity: Not on file  Stress: Not on file  Social Connections: Not on file  Depression (EYV7-0): Low Risk (09/25/2023)   Depression (PHQ2-9)    PHQ-2 Score: 0  Alcohol Screen: Not on file  Housing: Unknown (04/22/2023)   Received from [facility]   Epic    Unable to Pay for Housing in the Last Year:  Not on file    Number of Times Moved in the Last Year: Not on file    At any time in the past 12 months, were you homeless or living in a shelter (including now)?: No  Utilities: Not At Risk (04/17/2023)   [facility] Utilities    Threatened with loss of utilities: No  Health Literacy: Not on file     Family History:  The patient's family history includes Breast cancer in his mother; Heart attack in his father; Heart disease (age of onset: 76) in his father; Hypertension in his mother; Stomach cancer in his father.  ROS:   12-point review of systems is negative unless otherwise noted in the HPI.  EKGs/Other Studies Reviewed:    Studies reviewed were summarized above. The additional studies were reviewed today:  01/2024 2D echo 1. Technically difficult study. Echo contrast used.   2. Left ventricular ejection fraction, by estimation, is 55 to 60%. The  left ventricle has normal function. The left ventricle has no regional  wall motion abnormalities. The left ventricular internal cavity size was  mildly dilated. Left ventricular  diastolic parameters were normal.   3. Right ventricular systolic function was not well visualized. The right  ventricular size is  not well visualized.   4. The mitral valve is normal in structure. No evidence of mitral valve  regurgitation. No evidence of mitral stenosis.   5. The aortic valve was not well visualized. Aortic valve regurgitation  is not visualized. No aortic stenosis is present.   Comparison(s): A prior study was performed on 11/11/2022. No significant  change from prior study.   01/2024 Long term monitor Average heart rate 83, range 64-127 bpm. Occasional PVCs, 3.3% burden. No atrial fibrillation or atrial flutter. No sustained arrhythmias. Patient triggered events associated with occasional PVCs.  05/2023 Long term monitor Average heart rate 80 bpm, range 59-129. Frequent PVCs, 5.4% burden. No atrial fibrillation or atrial flutter. No sustained arrhythmias.   05/2023 Coronary CTA 1. Coronary calcium  score of 23.3. This was 58th percentile for age and sex matched control. 2. Normal coronary origin with right dominance. 3. Minimal mid LAD stenosis (<25%). 4. CAD-RADS 1. Minimal non-obstructive CAD (0-24%). Consider non-atherosclerotic causes of chest pain. Consider preventive therapy and risk factor modification.  EKG:  EKG personally reviewed by me today EKG Interpretation Date/Time:  Friday February 26 2024 10:12:53 EST Ventricular Rate:  94 PR Interval:  196 QRS Duration:  94 QT Interval:  374 QTC Calculation: 467 R Axis:   7  Text Interpretation: Sinus rhythm Premature ventricular complexes Confirmed by Lorene Sinclair ([id number]) on 02/26/2024 10:14:59 AM  PHYSICAL EXAM:    VS:  BP 110/68 (BP Location: Left Arm, Patient Position: Sitting, Cuff Size: Normal)   Pulse 93   Ht 6' 2 (1.88 m)   Wt (!) 330 lb (149.7 kg)   SpO2 96%   BMI 42.37 kg/m   BMI: Body mass index is 42.37 kg/m.  GEN: Well nourished, well developed in no acute distress NECK: No JVD; No carotid bruits CARDIAC: RRR, no murmurs, rubs, gallops RESPIRATORY:  Clear to auscultation without rales, wheezing or rhonchi   ABDOMEN: Soft, non-tender, non-distended EXTREMITIES: No edema; No deformity  Wt Readings from Last 3 Encounters:  02/26/24 (!) 330 lb (149.7 kg)  01/21/24 (!) 331 lb 9.6 oz (150.4 kg)  12/25/23 (!) 333 lb (151 kg)                  ASSESSMENT & PLAN:  Symptomatic PVCs - Burden improved on recent monitor 01/2024 to 3.3%, down from 5.7% on prior monitor 05/2023.  Recent ischemic evaluation with coronary CTA 05/2023 showed minimal nonobstructive CAD.  Echo 01/2024 with preserved LV systolic function.  Patient continues to be very symptomatic to PVCs.  Recommend increasing metoprolol  succinate to 50 mg daily.  Shortness of breath - Seems to be mostly related to symptomatic PVCs.  Recent echo with preserved LV systolic function and normal diastolic parameters.  Increase metoprolol  as above.  Coronary artery disease Hyperlipidemia - Minimal nonobstructive CAD on coronary CTA 05/2023.  No symptoms of angina or cardiac decompensation.  He is on Plavix  monotherapy for history of CVA.  Most recent lipid panel 05/2023 with LDL 76.  Continue rosuvastatin  and Repatha .  CVA x 2 - ZIO without evidence of atrial fibrillation or flutter.  Continue Plavix , Repatha , and rosuvastatin  as above.  Hypertension - BP at goal today.  Continue lisinopril  and metoprolol .  Continue to monitor.  Recommend bringing home BP cuff to follow-up to compare readings.    Disposition: Increase metoprolol  succinate to 50 mg daily.  F/u with Dr. Darliss or an APP in 6 months.   Medication Adjustments/Labs and Tests Ordered: Current medicines are reviewed at length with the patient today.  Concerns regarding medicines are outlined above. Medication changes, Labs and Tests ordered today are summarized above and listed in the Patient Instructions accessible in Encounters.   Bonney Lesley Maffucci, PA-C 02/26/2024 11:17 AM      [facility] [address] [place], [place]  [address] [phone]      [1]  Current Meds  Medication Sig   clopidogrel  (PLAVIX ) 75 MG tablet Take 1 tablet (75 mg total) by mouth daily.   Continuous Glucose Sensor (DEXCOM G7 SENSOR) MISC Use every 10 days as directed. Dx: E11.65.   cyclobenzaprine  (FLEXERIL ) 10 MG tablet TAKE 1 TABLET (10 MG TOTAL) BY MOUTH AT BEDTIME FOR BACK SPASM AS NEEDED ONLY   Dulaglutide  (TRULICITY ) 4.5 MG/0.5ML SOAJ Inject 4.5 mg as directed once a week.   empagliflozin  (JARDIANCE ) 25 MG TABS tablet Take 1 tablet (25 mg total) by mouth daily before breakfast.   escitalopram (LEXAPRO) 10 MG tablet Take 10 mg by mouth daily.   Evolocumab  (REPATHA  SURECLICK) 140 MG/ML SOAJ Inject 140 mg into the skin every 14 (fourteen) days.   furosemide  (LASIX ) 20 MG tablet TAKE 1 TABLET BY MOUTH EVERY DAY AS NEEDED FOR LEG SWELLING   gabapentin  (NEURONTIN ) 300 MG capsule TAKE 1 CAPSULE BY MOUTH THREE TIMES A DAY   insulin  aspart (NOVOLOG  FLEXPEN) 100 UNIT/ML FlexPen Inject with meals per sliding scale. Max 14 units   insulin  degludec (TRESIBA  FLEXTOUCH) 100 UNIT/ML FlexTouch Pen Inject 34 units in am sq   Insulin  Pen Needle (BD PEN NEEDLE NANO U/F) 32G X 4 MM MISC Use as directed once a daily   lamoTRIgine  (LAMICTAL ) 150 MG tablet Take 1 tablet (150 mg total) by mouth 2 (two) times daily.   lisinopril  (ZESTRIL ) 10 MG tablet Take 1 tablet (10 mg total) by mouth daily.   lisinopril -hydrochlorothiazide  (ZESTORETIC ) 20-25 MG tablet TAKE 1 TABLET BY MOUTH EVERY DAY   Multiple Vitamins-Minerals (MULTIVITAMIN ADULTS PO) Take 1 tablet by mouth daily.   nitroGLYCERIN  (NITROSTAT ) 0.4 MG SL tablet Place 1 tablet (0.4 mg total) under the tongue every 5 (five) minutes x 3 doses as needed for chest pain.   nortriptyline (PAMELOR) 10 MG capsule Take 50 mg by mouth  at bedtime.   nystatin -triamcinolone  ointment (MYCOLOG) Apply 1 Application topically 2 (two) times daily.   potassium chloride  (KLOR-CON  M) 10 MEQ tablet TAKE 1 TABLET BY MOUTH AS  NEEDED WITH FUROSEMIDE . (ONLY FOR LOW POTASSIUM)   QUEtiapine (SEROQUEL) 200 MG tablet Take 200 mg by mouth at bedtime.   rosuvastatin  (CRESTOR ) 5 MG tablet Take 1 tablet (5 mg total) by mouth at bedtime.   sulfamethoxazole -trimethoprim  (BACTRIM  DS) 800-160 MG tablet TAKE 1 TABLET BY MOUTH EVERY 12 HOURS   Vibegron  (GEMTESA ) 75 MG TABS Take 1 tablet (75 mg total) by mouth daily.   [DISCONTINUED] metoprolol  succinate (TOPROL  XL) 25 MG 24 hr tablet Take 1 tablet (25 mg total) by mouth daily.   "

## 2024-02-24 NOTE — Therapy (Addendum)
 " OUTPATIENT PHYSICAL THERAPY NEURO TREATMENT   Patient Name: Gerald [name] MRN: [id number] DOB:1965/10/08, 59 y.o., male Today's Date: 02/24/2024   PCP: Kristina Tinnie POUR, PA-C  REFERRING PROVIDER: Lane Arthea BRAVO, MD   END OF SESSION:   END OF SESSION:  PT End of Session - 02/08/24 0949     Visit Number 8   Number of Visits 24    Date for Recertification  04/13/24    Progress Note Due on Visit 10    PT Start Time 1446   PT Stop Time 1530   PT Time Calculation (min) 44 min    Equipment Utilized During Treatment Gait belt    Activity Tolerance Patient tolerated treatment well    Behavior During Therapy WFL for tasks assessed/performed              Past Medical History:  Diagnosis Date   Abnormal LFTs    Anxiety    Atypical chest pain    Borderline diabetes    CAD (coronary artery disease)    a.) PCI and stent placement (unknown type) to mLAD and mLCx; date unknown. b.) LHC 09/10/2010: 40% mLAD; no intervention. c.) LHC 09/12/2011: EF 60%; 30% ISR mLAD, 40% dLAD, 30% pLCx, 30% ISR mLCx, 40% mRCA; no intervention. d.) LHC 05/23/2014: EF >55%; 30% p-mLAD; no intervention. e.) LHC 06/11/2016: EF 55-65%; LVEDP norm; 30-40% p-mLAD; no intervention.   Carcinoma (HCC)    CHF (congestive heart failure) (HCC)    Chronic pain syndrome    Depression    Diabetes mellitus without complication (HCC)    Fundic gland polyps of stomach, benign    Gastritis    GERD (gastroesophageal reflux disease)    Hepatic steatosis    Hyperlipidemia    Hypertension    Migraines    Myocardial infarction [facility]) 2008   was told by PCP   Nonischemic cardiomyopathy (HCC)    a.) TTE 09/12/2011: EF 35-45%. b.) LHC 09/12/2011: EF 60%. c.) TTE 06/04/2012: EF 55-60%. d.) LHC 05/23/2014: EF >55%. e.) TTE 06/11/2016: EF 55-60%; G1DD. f.) LHC 06/11/2016: EF 55-65%. g.) TTE 01/08/2018: EF 60-65%.   Obesity    Occlusion and stenosis of bilateral carotid arteries    OSA on CPAP     Osteoarthritis of both knees    Shortness of breath    Squamous cell carcinoma of skin 02/27/2020   left distal lat deltoid - EDC   Stroke (HCC)    10/24, 3/25   Past Surgical History:  Procedure Laterality Date   BONE EXCISION Right 11/21/2020   Procedure: PART EXCISION BONE- PHALANX;  Surgeon: Ashley Soulier, DPM;  Location: [facility];  Service: Podiatry;  Laterality: Right;   CARDIAC CATHETERIZATION N/A 09/2011   [facility]; EF 50% with 30% mid LAD stenosis and no obstructive disease.   CARDIAC CATHETERIZATION  09/2010   [facility]; Mid LAD 40% stenosis; Mid Circumflex:Normal; Mid RCA; Normal   CARDIAC CATHETERIZATION     COLONOSCOPY     COLONOSCOPY WITH PROPOFOL  N/A 04/10/2021   Procedure: COLONOSCOPY WITH PROPOFOL ;  Surgeon: Therisa Bi, MD;  Location: [facility] ENDOSCOPY;  Service: Gastroenterology;  Laterality: N/A;   ESOPHAGOGASTRODUODENOSCOPY N/A 04/10/2021   Procedure: ESOPHAGOGASTRODUODENOSCOPY (EGD);  Surgeon: Therisa Bi, MD;  Location: [facility] ENDOSCOPY;  Service: Gastroenterology;  Laterality: N/A;   ESOPHAGOGASTRODUODENOSCOPY (EGD) WITH PROPOFOL  N/A 06/10/2017   Procedure: ESOPHAGOGASTRODUODENOSCOPY (EGD) WITH PROPOFOL ;  Surgeon: Therisa Bi, MD;  Location: [facility]COPY;  Service: Gastroenterology;  Laterality: N/A;   ESOPHAGOGASTRODUODENOSCOPY (EGD) WITH PROPOFOL  N/A  01/01/2022   Procedure: ESOPHAGOGASTRODUODENOSCOPY (EGD) WITH PROPOFOL ;  Surgeon: Therisa Bi, MD;  Location: [facility]COPY;  Service: Gastroenterology;  Laterality: N/A;   ESOPHAGOGASTRODUODENOSCOPY (EGD) WITH PROPOFOL  N/A 01/06/2022   Procedure: ESOPHAGOGASTRODUODENOSCOPY (EGD) WITH PROPOFOL ;  Surgeon: Therisa Bi, MD;  Location: [facility] ENDOSCOPY;  Service: Gastroenterology;  Laterality: N/A;   HAMMER TOE SURGERY Right 11/21/2020   Procedure: HAMMERTOE CORRECTION;  Surgeon: Ashley Soulier, DPM;  Location: [facility];  Service: Podiatry;  Laterality: Right;   INSERTION OF MESH  04/16/2021   Procedure: INSERTION OF  MESH;  Surgeon: Desiderio Schanz, MD;  Location: [facility] ORS;  Service: General;;   KNEE ARTHROSCOPY WITH MEDIAL MENISECTOMY Right 09/16/2012   Procedure: RIGHT KNEE ARTHROSCOPY WITH MEDIAL AND LATERAL MENISECTOMY, CHONDROPLASTY;  Surgeon: Toribio JULIANNA Chancy, MD;  Location: [facility];  Service: Orthopedics;  Laterality: Right;  RIGHT KNEE SCOPE MEDIAL MENISCECTOMY   LEFT HEART CATH AND CORONARY ANGIOGRAPHY N/A 06/11/2016   Procedure: Left Heart Cath and Coronary Angiography;  Surgeon: Perla Evalene PARAS, MD;  Location: [facility] INVASIVE [facility] LAB;  Service: Cardiovascular;  Laterality: N/A;   UMBILICAL HERNIA REPAIR N/A 04/16/2021   Procedure: HERNIA REPAIR UMBILICAL ADULT, open;  Surgeon: Desiderio Schanz, MD;  Location: [facility] ORS;  Service: General;  Laterality: N/A;   Patient Active Problem List   Diagnosis Date Noted   Stroke (HCC) 05/10/2023   CVA (cerebral vascular accident) (HCC) 04/16/2023   Acute CVA (cerebrovascular accident) (HCC) 11/10/2022   OSA (obstructive sleep apnea) 08/18/2022   CPAP use counseling 08/18/2022   Essential hypertension 08/18/2022   Insufficient sleep syndrome 08/18/2022   Melena 01/06/2022   Postoperative abdominal pain 04/24/2021   Mesenteric panniculitis (HCC) 04/24/2021   Orthopnea    Infection of superficial incisional surgical site after procedure    Non-recurrent bilateral inguinal hernia without obstruction or gangrene    Umbilical hernia without obstruction and without gangrene    Impingement syndrome of right shoulder region 11/07/2019   Chest pain 01/08/2018   Gastroesophageal reflux disease without esophagitis 08/23/2017   Urinary tract infection without hematuria 06/14/2017   Generalized anxiety disorder 02/02/2017   Abnormal results of liver function studies 02/02/2017   Circadian rhythm sleep disorder, shift work type 02/02/2017   Acne vulgaris 02/02/2017   Occlusion and stenosis of bilateral carotid arteries 02/02/2017   Neoplasm of uncertain  behavior of skin 02/02/2017   Morbid obesity (HCC) 07/07/2016   Atypical chest pain 06/08/2016   Mass of jaw 05/22/2014   Retaining fluid 11/08/2013   Urinary incontinence 03/14/2013   Depression 01/13/2013   Type 2 diabetes mellitus (HCC) 09/14/2012   Nonischemic cardiomyopathy (HCC)    Mixed hyperlipidemia 05/28/2012   Chronic pain syndrome 03/30/2012   HTN (hypertension) 03/30/2012    ONSET DATE: 10/24  REFERRING DIAG: Imbalance  THERAPY DIAG:  Muscle weakness (generalized)  Gait difficulty  Balance disorder  Imbalance  Rationale for Evaluation and Treatment: Rehabilitation  SUBJECTIVE:  SUBJECTIVE STATEMENT: From Today  Patient reports he had less soreness after last session and feels like he is feeling some improvements.  Patient's insurance did change and is now only approved for 12 visits over a 12-week period.  Discussed change in treatment schedule as well as change in plan for at home working provided new and updated home exercise program to complete some days a week but still continuing other home exercise program on other days of the week.   From Eval: Pt was in PT from march to August then insurance denied further session per his report. He mentioned this to his MD that he tries exercise at home but he has trouble and has lost his balance and fallen to the floor several times. He is having severe L sided weakness and occasional numbness. His hips occasionally give way causing imbalance. Pt also has difficulties eating due to grasping utensils. Pt has been having headaches as well that feel like pressure radiating form the back of his head near base of occiput. Getting up and down from the chair is very hard and walking is difficult as well. Doing things throughout the day is  exhausting due to fatigue. Pt cannot complete a task without taking a break in between. Pt second stroke only exacerbated these symptoms. Pt still has sensation on the left side but it is dull and numb. Pt is very nervous stepping up on something like a curb or step.   Pt accompanied by: self  PERTINENT HISTORY:  From Eval:  Pt was in PT from march to August then insurance denied further session per his report. He mentioned this to his MD that he tries exercise at home but he has trouble and has lost his balance and fallen to the floor several times. He is having severe L sided weakness and occasional numbness. His hips occasionally give way causing imbalance. Pt also has difficulties eating due to grasping utensils. Pt has been having headaches as well that feel like pressure radiating form the back of his head near base of occiput. Getting up and down from the chair is very hard and walking is difficult as well. Doing things throughout the day is exhausting due to fatigue. Pt cannot complete a task without taking a break in between. Pt second stroke only exacerbated these symptoms. Pt still has sensation on the left side but it is dull and numb. Pt is very nervous stepping up on something like a curb or step.   Pt with relevant history of CAD, CHF, DM, GERD, MI, HTN, HLD, obseity and 2 x CVA (10/24 and 3/25). Per las MD note from referral. Patient overall unchanged symptoms. No new recent stroke like symptoms. Unchanged residual left sided weakness and numbness from previous strokes. Balance remains poor, no recent falls. Ambulates with a walker at all times. Continues to have numbness in the right thigh when exercising. Unchanged daily severe headaches that start in the occipital region and radiate to the right frontal region. Describes pain as a throbbing, pressure sensation. Sleep continues to remain poor but have somewhat improved.  PAIN:  Are you having pain? No  PRECAUTIONS: Fall  RED  FLAGS: None   WEIGHT BEARING RESTRICTIONS: No  FALLS: Has patient fallen in last 6 months? Yes. Number of falls 2  LIVING ENVIRONMENT: Lives with: lives with their family and lives with their spouse Lives in: House/apartment Stairs: No Has following equipment at home: Single point cane and Environmental Consultant - 2 wheeled  PLOF: Independent,  Independent with basic ADLs, Independent with gait, and Independent with transfers  PATIENT GOALS: improve mobility, strength, and balance  OBJECTIVE:  Note: Objective measures were completed at Evaluation unless otherwise noted.  DIAGNOSTIC FINDINGS: Last MRI on 07/28/23 :IMPRESSION: 1. No acute intracranial abnormality. 2. Remote lacunar infarcts involving the left anterior genu of the corpus callosum, right lentiform nucleus, and right thalamus.  COGNITION: Overall cognitive status: Within functional limits for tasks assessed   SENSATION: Not tested Ipmaired on L side per report      POSTURE: rounded shoulder and flexed trunk posture when standing and using UE support on walker   LOWER EXTREMITY ROM:     WNL for tasks assessed   LOWER EXTREMITY MMT:    MMT Right Eval Left Eval  Hip flexion 4+ 3  Hip extension    Hip abduction 4+ 3+  Hip adduction 4+ 3  Hip internal rotation    Hip external rotation    Knee flexion 4+ 3+  Knee extension 4+ 3+  Ankle dorsiflexion    Ankle plantarflexion    Ankle inversion    Ankle eversion    (Blank rows = not tested)  BED MOBILITY:  More time and needs time to adjust.   TRANSFERS: Sit to stand: CGA  Assistive device utilized: Environmental Consultant - 2 wheeled     Stand to sit: CGA  Assistive device utilized: Environmental Consultant - 2 wheeled     Chair to chair: Modified independence and CGA  Assistive device utilized: Environmental Consultant - 2 wheeled      Floor to chair- excessive time and effort per report  RAMP:  Not tested  CURB:  Check future session   STAIRS: Check future session  GAIT: Findings: Distance walked: 10  meters and Comments: slow gait speed, poor awareness of LLE foot position   FUNCTIONAL TESTS:  5 times sit to stand: 63.92 sec - completes 2.5 reps with heavy UE assist  Timed up and go (TUG): 82.58 se with RW 10 meter walk test: 68.43 sec with RW   PATIENT SURVEYS:  Stroke Impact Scale   Question date:   Dress the top part of your body? 75  Bathe yourself? 50  Get to the toilet on time? 75  Control your bladder (not have an accident)? 75  Control your bowels (not have an accident)? 75  Stand without losing balance? 50  Go shopping? 25  Do heavy household chores (e.g., vacuum, laundry, yard work)? 25  Stay sitting without losing balance? 75  Walk without losing balance? 50  Move from a bed to a chair? 75  Walk fast? 0  Climb one flight of stairs? 0  Walk one block? 0  Get in and out of a car? 50  Carry heavy objects (e.g., bag of groceries) with your affected hand? 25  total out of 1600 725  total / [phone]       PT TREATMENT:                                                                                                  02/24/24  TA- To improve functional movements patterns for everyday tasks   Donned lite gait harness - LiteGait harness applied with patient in standing position. Straps secured snugly around torso and legs per manufacturer guidelines. Checked for proper alignment, comfort, and safety prior to standing and gait training. All buckles and fasteners verified secure; weight-bearing support adjusted to patient tolerance   Stepped onto treadmill using R LE in lite gait system then   Gait training - activities focussed on specific components of gait cycle and multimodal cueing for completion  3 min of gait with cues for weight shift and step length and knee extension in stance phase .6 mph x 3 min  2 min rest  . x 3 min same cues as above - improve weight shift and foot clearance noted  2 min rest  .7 mph x 3 min same cues as above - improve  weight shift and foot clearance noted  2 min rest  .7 mph x 3 min same cues as above - improve weight shift and foot clearance noted  Practice turning - unlocked transverse plane movement on lite gait and performed 360 deg turn x 2 turn ea direction did not use UE support !  Stepped off of tredamill backwards   Doffed lite gait harness- LiteGait harness removed with patient in standing position. Due to increased abdominal pressure from harness, pt observed for dizziness post removal before transitioning to sitting for recovery.    Unless otherwise stated, SBA was provided and gait belt donned in order to ensure pt safety  Pt required occasional rest breaks due fatigue, PT was attentive to when pt appeared to be tired or winded in order to prevent excessive fatigue.   PATIENT EDUCATION: Education details: POC Person educated: Patient Education method: Explanation Education comprehension: verbalized understanding   HOME EXERCISE PROGRAM: Access Code: [email or web address] URL: [email or web address] Date: 01/25/2024 Prepared by: Lonni Gainer  Exercises - Marching Near Counter  - 1 x daily - 4 x weekly - 2 sets - 10 reps - Sidestepping near counter   - 1 x daily - 4 x weekly - 2 sets - 10 reps - Mini Squat with Counter Support  - 1 x daily - 4 x weekly - 2 sets - 10 reps - Heel Raises with Counter Support  - 1 x daily - 4 x weekly - 2 sets - 10 reps  Access Code: [email or web address] URL: [email or web address] Date: 02/08/2024 Prepared by: Lonni Gainer  Exercises -Verbally added walking for about 50 feet with walker to beginning of this program but not on MedBridge - Sit to Stand with Arms Crossed  - 2 x weekly - 2 sets - 7 reps - Backwards Walking  - 1 x daily - 2 x weekly - 2 sets - 10 ft  hold - Heel Raises with Counter Support  - 1 x daily - 7 x weekly - 2 sets - 10 reps   GOALS: Goals reviewed with patient? Yes  SHORT TERM GOALS: Target date: 02/17/2024    Patient will be independent in home exercise program to improve strength/mobility for better functional independence with ADLs. Baseline: No HEP currently  Goal status: INITIAL   LONG TERM GOALS: Target date: 04/13/2024  1.  Patient will complete five times sit to stand test in < 15 seconds indicating an increased LE strength and improved balance. Baseline: 5 times sit to stand: 63.92 sec - completes 2.5 reps with heavy UE assist   Goal status: INITIAL  2.  Patient will improve  SIS 16 score to 55  to demonstrate statistically significant improvement in mobility and limitations as it relates to their QOL impact as a result of his CVA.  Baseline: 45.3 Goal status: INITIAL     3.   Patient will reduce timed up and go to <30 seconds to reduce fall risk and demonstrate improved transfer/gait ability. Baseline:  82.58 se with RW  Goal status: INITIAL  4.   Patient will increase 10 meter walk test to >1.3m/s as to improve gait speed for better community ambulation and to reduce fall risk. Baseline:  68.43 sec with RW Goal status: INITIAL  5.   Patient will increase 2 minute walk test distance to >1000 for progression to community ambulator and improve gait ability Baseline: 75 ft with BRW - very fatigued  Goal status: INITIAL  ASSESSMENT:  CLINICAL IMPRESSION:  The patient is a 59 year old male status post CVA who continues to demonstrate gait impairments, decreased weight shifting, limited foot clearance and challenges with functional movement patterns. During todays visit, he participated in therapeutic activities and structured gait training within the LiteGait system, showing improvements in weight shift, step length and foot clearance across multiple bouts. He was also able to practice turning without upper extremity support and required only occasional rest breaks which were managed appropriately for safety and fatigue control. Continued physical therapy remains beneficial in  order to progress his neuromuscular recovery, reinforce correct movement patterns and safely advance his home exercise program. Skilled intervention is necessary to guide motor relearning, adjust exercise intensity, reduce compensations and prevent the pain and setbacks he experiences when he attempts to progress on his own without timely supervision. His current insurance visit limits are significantly restricting his progress. Long gaps between sessions make it difficult to adjust his home exercise program appropriately as demonstrated by the significant pain he experienced over the past week and his inability to modify his exercises until this appointment ten days later, limiting his progress during that vital week of recovery. More consistent access to therapy is important to maintain momentum, ensure safe progression and optimize his long term functional outcomes.   OBJECTIVE IMPAIRMENTS: Abnormal gait, decreased balance, decreased coordination, decreased endurance, decreased mobility, difficulty walking, decreased strength, impaired perceived functional ability, and obesity.   ACTIVITY LIMITATIONS: carrying, lifting, standing, squatting, stairs, transfers, and locomotion level  PARTICIPATION LIMITATIONS: meal prep, driving, shopping, community activity, and yard work  PERSONAL FACTORS: Behavior pattern, Fitness, Time since onset of injury/illness/exacerbation, and 3+ comorbidities: CAD, CHF, DM, GERD, MI, HTN, HLD, obseity and 2 x CVA (10/24 and 3/25) are also affecting patient's functional outcome.   REHAB POTENTIAL: Good  CLINICAL DECISION MAKING: Evolving/moderate complexity  EVALUATION COMPLEXITY: Moderate  PLAN:  PT FREQUENCY: 2x/week  PT DURATION: 12 weeks  PLANNED INTERVENTIONS: 97750- Physical Performance Testing, 97110-Therapeutic exercises, 97530- Therapeutic activity, 97112- Neuromuscular re-education, 97535- Self Care, [id number]- Manual therapy, [phone]- Gait training,  Patient/Family education, Balance training, Stair training, Vestibular training, Visual/preceptual remediation/compensation, DME instructions, and Wheelchair mobility training  PLAN FOR NEXT SESSION:  Update HEP and iron out plan for day of week where he works at a similar effort to day he is in PT since insurance visit limit is limiting his treatment and progress!  Lite gait treadmill training for gait speed and endurance with functional strength intermingled   Lonni KATHEE Gainer, PT 02/24/2024, 2:26 PM        "

## 2024-02-26 ENCOUNTER — Ambulatory Visit: Attending: Physician Assistant | Admitting: Physician Assistant

## 2024-02-26 ENCOUNTER — Ambulatory Visit: Admitting: Physical Therapy

## 2024-02-26 ENCOUNTER — Encounter: Payer: Self-pay | Admitting: Physician Assistant

## 2024-02-26 VITALS — BP 110/68 | HR 93 | Ht 74.0 in | Wt 330.0 lb

## 2024-02-26 DIAGNOSIS — I493 Ventricular premature depolarization: Secondary | ICD-10-CM | POA: Diagnosis present

## 2024-02-26 DIAGNOSIS — E785 Hyperlipidemia, unspecified: Secondary | ICD-10-CM | POA: Diagnosis present

## 2024-02-26 DIAGNOSIS — R0602 Shortness of breath: Secondary | ICD-10-CM | POA: Insufficient documentation

## 2024-02-26 DIAGNOSIS — I2510 Atherosclerotic heart disease of native coronary artery without angina pectoris: Secondary | ICD-10-CM | POA: Diagnosis present

## 2024-02-26 DIAGNOSIS — I10 Essential (primary) hypertension: Secondary | ICD-10-CM | POA: Diagnosis present

## 2024-02-26 DIAGNOSIS — I639 Cerebral infarction, unspecified: Secondary | ICD-10-CM | POA: Diagnosis present

## 2024-02-26 MED ORDER — METOPROLOL SUCCINATE ER 50 MG PO TB24: 50.0000 mg | ORAL_TABLET | Freq: Every day | ORAL | 3 refills | Status: AC

## 2024-02-26 MED ORDER — METOPROLOL SUCCINATE ER 50 MG PO TB24: 25.0000 mg | ORAL_TABLET | Freq: Every day | ORAL | 3 refills | Status: DC

## 2024-02-26 MED ORDER — METOPROLOL SUCCINATE ER 50 MG PO TB24: 50.0000 mg | ORAL_TABLET | Freq: Every day | ORAL | 3 refills | Status: DC

## 2024-02-26 NOTE — Patient Instructions (Signed)
 Medication Instructions:  Your physician recommends the following medication changes.  INCREASE: Metoprolol  50 mg Daily  *If you need a refill on your cardiac medications before your next appointment, please call your pharmacy*  Lab Work: No labs ordered today  If you have labs (blood work) drawn today and your tests are completely normal, you will receive your results only by: MyChart Message (if you have MyChart) OR A paper copy in the mail If you have any lab test that is abnormal or we need to change your treatment, we will call you to review the results.  Testing/Procedures: No test ordered today   Follow-Up: At [facility], you and your health needs are our priority.  As part of our continuing mission to provide you with exceptional heart care, our providers are all part of one team.  This team includes your primary Cardiologist (physician) and Advanced Practice Providers or APPs (Physician Assistants and Nurse Practitioners) who all work together to provide you with the care you need, when you need it.  Your next appointment:   6 month(s)  Provider:   You may see Redell Cave, MD or one of the following Advanced Practice Providers on your designated Care Team:   Lonni Meager, NP Lesley Maffucci, PA-C Bernardino Bring, PA-C Cadence Winifred, PA-C Tylene Lunch, NP Barnie Hila, NP    We recommend signing up for the patient portal called MyChart.  Sign up information is provided on this After Visit Summary.  MyChart is used to connect with patients for Virtual Visits (Telemedicine).  Patients are able to view lab/test results, encounter notes, upcoming appointments, etc.  Non-urgent messages can be sent to your provider as well.   To learn more about what you can do with MyChart, go to [email or web address].   Other Instructions

## 2024-02-29 ENCOUNTER — Ambulatory Visit: Admitting: Physician Assistant

## 2024-03-02 ENCOUNTER — Ambulatory Visit: Admitting: Physical Therapy

## 2024-03-03 ENCOUNTER — Encounter: Payer: Self-pay | Admitting: Physician Assistant

## 2024-03-03 ENCOUNTER — Ambulatory Visit: Admitting: Physical Therapy

## 2024-03-03 ENCOUNTER — Ambulatory Visit: Admitting: Physician Assistant

## 2024-03-03 VITALS — BP 135/70 | HR 86 | Temp 98.0°F | Resp 16 | Ht 74.0 in | Wt 334.0 lb

## 2024-03-03 DIAGNOSIS — M2560 Stiffness of unspecified joint, not elsewhere classified: Secondary | ICD-10-CM

## 2024-03-03 DIAGNOSIS — R269 Unspecified abnormalities of gait and mobility: Secondary | ICD-10-CM

## 2024-03-03 DIAGNOSIS — I493 Ventricular premature depolarization: Secondary | ICD-10-CM | POA: Diagnosis not present

## 2024-03-03 DIAGNOSIS — I6930 Unspecified sequelae of cerebral infarction: Secondary | ICD-10-CM | POA: Diagnosis not present

## 2024-03-03 DIAGNOSIS — Z794 Long term (current) use of insulin: Secondary | ICD-10-CM | POA: Diagnosis not present

## 2024-03-03 DIAGNOSIS — M6281 Muscle weakness (generalized): Secondary | ICD-10-CM | POA: Diagnosis not present

## 2024-03-03 DIAGNOSIS — E1165 Type 2 diabetes mellitus with hyperglycemia: Secondary | ICD-10-CM | POA: Diagnosis not present

## 2024-03-03 DIAGNOSIS — I10 Essential (primary) hypertension: Secondary | ICD-10-CM | POA: Diagnosis not present

## 2024-03-03 DIAGNOSIS — R2689 Other abnormalities of gait and mobility: Secondary | ICD-10-CM

## 2024-03-03 LAB — POCT GLYCOSYLATED HEMOGLOBIN (HGB A1C): Hemoglobin A1C: 8.8 % — AB (ref 4.0–5.6)

## 2024-03-03 MED ORDER — DEXCOM G7 SENSOR MISC: 12 refills | Status: AC

## 2024-03-03 MED ORDER — TRESIBA FLEXTOUCH 100 UNIT/ML ~~LOC~~ SOPN: PEN_INJECTOR | SUBCUTANEOUS | Status: AC

## 2024-03-03 MED ORDER — TRULICITY 4.5 MG/0.5ML ~~LOC~~ SOAJ: 4.5000 mg | SUBCUTANEOUS | 6 refills | Status: AC

## 2024-03-03 NOTE — Therapy (Signed)
 " OUTPATIENT PHYSICAL THERAPY NEURO TREATMENT   Patient Name: Gerald [name] MRN: [id number] DOB:04-30-1965, 59 y.o., male Today's Date: 03/03/2024   PCP: Kristina Tinnie POUR, PA-C  REFERRING PROVIDER: Lane Arthea BRAVO, MD   END OF SESSION:  PT End of Session - 03/03/24 1052     Visit Number 9    Number of Visits 24    Date for Recertification  04/13/24    Progress Note Due on Visit 10    PT Start Time 1055    PT Stop Time 1135    PT Time Calculation (min) 40 min    Equipment Utilized During Treatment Gait belt    Activity Tolerance Patient tolerated treatment well    Behavior During Therapy WFL for tasks assessed/performed          END OF SESSION:  PT End of Session - 02/08/24 0949     Visit Number 8   Number of Visits 24    Date for Recertification  04/13/24    Progress Note Due on Visit 10    PT Start Time 1446   PT Stop Time 1530   PT Time Calculation (min) 44 min    Equipment Utilized During Treatment Gait belt    Activity Tolerance Patient tolerated treatment well    Behavior During Therapy WFL for tasks assessed/performed              Past Medical History:  Diagnosis Date   Abnormal LFTs    Anxiety    Atypical chest pain    Borderline diabetes    CAD (coronary artery disease)    a.) PCI and stent placement (unknown type) to mLAD and mLCx; date unknown. b.) LHC 09/10/2010: 40% mLAD; no intervention. c.) LHC 09/12/2011: EF 60%; 30% ISR mLAD, 40% dLAD, 30% pLCx, 30% ISR mLCx, 40% mRCA; no intervention. d.) LHC 05/23/2014: EF >55%; 30% p-mLAD; no intervention. e.) LHC 06/11/2016: EF 55-65%; LVEDP norm; 30-40% p-mLAD; no intervention.   Carcinoma (HCC)    CHF (congestive heart failure) (HCC)    Chronic pain syndrome    Depression    Diabetes mellitus without complication (HCC)    Fundic gland polyps of stomach, benign    Gastritis    GERD (gastroesophageal reflux disease)    Hepatic steatosis    Hyperlipidemia    Hypertension    Migraines     Myocardial infarction [facility]) 2008   was told by PCP   Nonischemic cardiomyopathy (HCC)    a.) TTE 09/12/2011: EF 35-45%. b.) LHC 09/12/2011: EF 60%. c.) TTE 06/04/2012: EF 55-60%. d.) LHC 05/23/2014: EF >55%. e.) TTE 06/11/2016: EF 55-60%; G1DD. f.) LHC 06/11/2016: EF 55-65%. g.) TTE 01/08/2018: EF 60-65%.   Obesity    Occlusion and stenosis of bilateral carotid arteries    OSA on CPAP    Osteoarthritis of both knees    Shortness of breath    Squamous cell carcinoma of skin 02/27/2020   left distal lat deltoid - ED[facility]   Stroke (HCC)    10/24, 3/25   Past Surgical History:  Procedure Laterality Date   BONE EXCISION Right 11/21/2020   Procedure: PART EXCISION BONE- PHALANX;  Surgeon: Ashley Soulier, DPM;  Location: [facility];  Service: Podiatry;  Laterality: Right;   CARDIAC CATHETERIZATION N/A 09/2011   [facility]; EF 50% with 30% mid LAD stenosis and no obstructive disease.   CARDIAC CATHETERIZATION  09/2010   [facility]; Mid LAD 40% stenosis; Mid Circumflex:Normal; Mid RCA; Normal   CARDIAC CATHETERIZATION  COLONOSCOPY     COLONOSCOPY WITH PROPOFOL  N/A 04/10/2021   Procedure: COLONOSCOPY WITH PROPOFOL ;  Surgeon: Therisa Bi, MD;  Location: [facility] END[facility]COPY;  Service: Gastroenterology;  Laterality: N/A;   ESOPHAGOGASTRODUODENOSCOPY N/A 04/10/2021   Procedure: ESOPHAGOGASTRODUODENOSCOPY (EGD);  Surgeon: Therisa Bi, MD;  Location: [facility] ENDOSCOPY;  Service: Gastroenterology;  Laterality: N/A;   ESOPHAGOGASTRODUODENOSCOPY (EGD) WITH PROPOFOL  N/A 06/10/2017   Procedure: ESOPHAGOGASTRODUODENOSCOPY (EGD) WITH PROPOFOL ;  Surgeon: Therisa Bi, MD;  Location: [facility];  Service: Gastroenterology;  Laterality: N/A;   ESOPHAGOGASTRODUODENOSCOPY (EGD) WITH PROPOFOL  N/A 01/01/2022   Procedure: ESOPHAGOGASTRODUODENOSCOPY (EGD) WITH PROPOFOL ;  Surgeon: Therisa Bi, MD;  Location: [facility] ENDOSCOPY;  Service: Gastroenterology;  Laterality: N/A;   ESOPHAGOGASTRODUODENOSCOPY (EGD) WITH PROPOFOL  N/A  01/06/2022   Procedure: ESOPHAGOGASTRODUODENOSCOPY (EGD) WITH PROPOFOL ;  Surgeon: Therisa Bi, MD;  Location: [facility] ENDOSCOPY;  Service: Gastroenterology;  Laterality: N/A;   HAMMER TOE SURGERY Right 11/21/2020   Procedure: HAMMERTOE CORRECTION;  Surgeon: Ashley Soulier, DPM;  Location: [facility];  Service: Podiatry;  Laterality: Right;   INSERTION OF MESH  04/16/2021   Procedure: INSERTION OF MESH;  Surgeon: Desiderio Schanz, MD;  Location: [facility] ORS;  Service: General;;   KNEE ARTHROSCOPY WITH MEDIAL MENISECTOMY Right 09/16/2012   Procedure: RIGHT KNEE ARTHROSCOPY WITH MEDIAL AND LATERAL MENISECTOMY, CHONDROPLASTY;  Surgeon: Toribio JULIANNA Chancy, MD;  Location: [facility];  Service: Orthopedics;  Laterality: Right;  RIGHT KNEE SCOPE MEDIAL MENISCECTOMY   LEFT HEART CATH AND CORONARY ANGIOGRAPHY N/A 06/11/2016   Procedure: Left Heart Cath and Coronary Angiography;  Surgeon: Perla Evalene PARAS, MD;  Location: [facility] INV[facility];  Service: Cardiovascular;  Laterality: N/A;   UMBILICAL HERNIA REPAIR N/A 04/16/2021   Procedure: HERNIA REPAIR UMBILICAL ADULT, open;  Surgeon: Desiderio Schanz, MD;  Location: [facility] ORS;  Service: General;  Laterality: N/A;   Patient Active Problem List   Diagnosis Date Noted   Stroke (HCC) 05/10/2023   CVA (cerebral vascular accident) (HCC) 04/16/2023   Acute CVA (cerebrovascular accident) (HCC) 11/10/2022   OSA (obstructive sleep apnea) 08/18/2022   CPAP use counseling 08/18/2022   Essential hypertension 08/18/2022   Insufficient sleep syndrome 08/18/2022   Melena 01/06/2022   Postoperative abdominal pain 04/24/2021   Mesenteric panniculitis (HCC) 04/24/2021   Orthopnea    Infection of superficial incisional surgical site after procedure    Non-recurrent bilateral inguinal hernia without obstruction or gangrene    Umbilical hernia without obstruction and without gangrene    Impingement syndrome of right shoulder region 11/07/2019   Chest pain  01/08/2018   Gastroesophageal reflux disease without esophagitis 08/23/2017   Urinary tract infection without hematuria 06/14/2017   Generalized anxiety disorder 02/02/2017   Abnormal results of liver function studies 02/02/2017   Circadian rhythm sleep disorder, shift work type 02/02/2017   Acne vulgaris 02/02/2017   Occlusion and stenosis of bilateral carotid arteries 02/02/2017   Neoplasm of uncertain behavior of skin 02/02/2017   Morbid obesity (HCC) 07/07/2016   Atypical chest pain 06/08/2016   Mass of jaw 05/22/2014   Retaining fluid 11/08/2013   Urinary incontinence 03/14/2013   Depression 01/13/2013   Type 2 diabetes mellitus (HCC) 09/14/2012   Nonischemic cardiomyopathy (HCC)    Mixed hyperlipidemia 05/28/2012   Chronic pain syndrome 03/30/2012   HTN (hypertension) 03/30/2012    ONSET DATE: 10/24  REFERRING DIAG: Imbalance  THERAPY DIAG:  Muscle weakness (generalized)  Gait difficulty  Balance disorder  Imbalance  Joint stiffness  Rationale for Evaluation and Treatment: Rehabilitation  SUBJECTIVE:  SUBJECTIVE STATEMENT: From Today   Pt reports that he is doing well. No pain reported on this day. Pt reports that he is still performing HEP regularly, but feels like there is a sudden weakness and hip and thigh will buckle with a few of the exercises. States that he had a little soreness after completing a few of the exercises.    From Eval: Pt was in PT from march to August then insurance denied further session per his report. He mentioned this to his MD that he tries exercise at home but he has trouble and has lost his balance and fallen to the floor several times. He is having severe L sided weakness and occasional numbness. His hips occasionally give way causing imbalance. Pt  also has difficulties eating due to grasping utensils. Pt has been having headaches as well that feel like pressure radiating form the back of his head near base of occiput. Getting up and down from the chair is very hard and walking is difficult as well. Doing things throughout the day is exhausting due to fatigue. Pt cannot complete a task without taking a break in between. Pt second stroke only exacerbated these symptoms. Pt still has sensation on the left side but it is dull and numb. Pt is very nervous stepping up on something like a curb or step.   Pt accompanied by: self  PERTINENT HISTORY:  From Eval:  Pt was in PT from march to August then insurance denied further session per his report. He mentioned this to his MD that he tries exercise at home but he has trouble and has lost his balance and fallen to the floor several times. He is having severe L sided weakness and occasional numbness. His hips occasionally give way causing imbalance. Pt also has difficulties eating due to grasping utensils. Pt has been having headaches as well that feel like pressure radiating form the back of his head near base of occiput. Getting up and down from the chair is very hard and walking is difficult as well. Doing things throughout the day is exhausting due to fatigue. Pt cannot complete a task without taking a break in between. Pt second stroke only exacerbated these symptoms. Pt still has sensation on the left side but it is dull and numb. Pt is very nervous stepping up on something like a curb or step.   Pt with relevant history of CAD, CHF, DM, GERD, MI, HTN, HLD, obseity and 2 x CVA (10/24 and 3/25). Per las MD note from referral. Patient overall unchanged symptoms. No new recent stroke like symptoms. Unchanged residual left sided weakness and numbness from previous strokes. Balance remains poor, no recent falls. Ambulates with a walker at all times. Continues to have numbness in the right thigh when  exercising. Unchanged daily severe headaches that start in the occipital region and radiate to the right frontal region. Describes pain as a throbbing, pressure sensation. Sleep continues to remain poor but have somewhat improved.  PAIN:  Are you having pain? No  PRECAUTIONS: Fall  RED FLAGS: None   WEIGHT BEARING RESTRICTIONS: No  FALLS: Has patient fallen in last 6 months? Yes. Number of falls 2  LIVING ENVIRONMENT: Lives with: lives with their family and lives with their spouse Lives in: House/apartment Stairs: No Has following equipment at home: Single point cane and Environmental Consultant - 2 wheeled  PLOF: Independent, Independent with basic ADLs, Independent with gait, and Independent with transfers  PATIENT GOALS: improve mobility, strength, and  balance  OBJECTIVE:  Note: Objective measures were completed at Evaluation unless otherwise noted.  DIAGNOSTIC FINDINGS: Last MRI on 07/28/23 :IMPRESSION: 1. No acute intracranial abnormality. 2. Remote lacunar infarcts involving the left anterior genu of the corpus callosum, right lentiform nucleus, and right thalamus.  COGNITION: Overall cognitive status: Within functional limits for tasks assessed   SENSATION: Not tested Ipmaired on L side per report      POSTURE: rounded shoulder and flexed trunk posture when standing and using UE support on walker   LOWER EXTREMITY ROM:     WNL for tasks assessed   LOWER EXTREMITY MMT:    MMT Right Eval Left Eval  Hip flexion 4+ 3  Hip extension    Hip abduction 4+ 3+  Hip adduction 4+ 3  Hip internal rotation    Hip external rotation    Knee flexion 4+ 3+  Knee extension 4+ 3+  Ankle dorsiflexion    Ankle plantarflexion    Ankle inversion    Ankle eversion    (Blank rows = not tested)  BED MOBILITY:  More time and needs time to adjust.   TRANSFERS: Sit to stand: CGA  Assistive device utilized: Environmental Consultant - 2 wheeled     Stand to sit: CGA  Assistive device utilized: Environmental Consultant -  2 wheeled     Chair to chair: Modified independence and CGA  Assistive device utilized: Environmental Consultant - 2 wheeled      Floor to chair- excessive time and effort per report  RAMP:  Not tested  CURB:  Check future session   STAIRS: Check future session  GAIT: Findings: Distance walked: 10 meters and Comments: slow gait speed, poor awareness of LLE foot position   FUNCTIONAL TESTS:  5 times sit to stand: 63.92 sec - completes 2.5 reps with heavy UE assist  Timed up and go (TUG): 82.58 se with RW 10 meter walk test: 68.43 sec with RW   PATIENT SURVEYS:  Stroke Impact Scale   Question date:   Dress the top part of your body? 75  Bathe yourself? 50  Get to the toilet on time? 75  Control your bladder (not have an accident)? 75  Control your bowels (not have an accident)? 75  Stand without losing balance? 50  Go shopping? 25  Do heavy household chores (e.g., vacuum, laundry, yard work)? 25  Stay sitting without losing balance? 75  Walk without losing balance? 50  Move from a bed to a chair? 75  Walk fast? 0  Climb one flight of stairs? 0  Walk one block? 0  Get in and out of a car? 50  Carry heavy objects (e.g., bag of groceries) with your affected hand? 25  total out of 1600 725  total / 1600 [id number]       PT TREATMENT:                                                                                                  03/03/24   HEP review/expansion  Sit<>stand 2 x 5.  Forward/reverse gait with RW 29ft x 2.  Standing  march 2 x 6 bil  Seated hip abduction x 6 GTB x 8 RTB Heel slide x 8 bil  LAQ x 10 bil  Hip flexion x 10.   Performed additional 5 for BLE of hip flexion and LAQ with RTB to wrap thigh and lower limb. Improved speed of movement and and ROM with increased sensory feedback from deep pressure.   Stand pivot transfer to transport chair. UE supported on chair only. Short unsteady steps.  Left sitting in WC at end of session  and left in PT lobby   TA- To  improve functional movements patterns for everyday tasks   Donned lite gait harness - LiteGait harness applied with patient in standing position. Straps secured snugly around torso and legs per manufacturer guidelines. Checked for proper alignment, comfort, and safety prior to standing and gait training. All buckles and fasteners verified secure; weight-bearing support adjusted to patient tolerance   Stepped onto treadmill using R LE in lite gait system then   Gait training - activities focussed on specific components of gait cycle and multimodal cueing for completion  3 min of gait with cues for weight shift and step length and knee extension in stance phase .6 mph x 3 min  2 min rest  . x 3 min same cues as above - improve weight shift and foot clearance noted  2 min rest  .7 mph x 3 min same cues as above - improve weight shift and foot clearance noted  2 min rest  .7 mph x 3 min same cues as above - improve weight shift and foot clearance noted  Practice turning - unlocked transverse plane movement on lite gait and performed 360 deg turn x 2 turn ea direction did not use UE support !  Stepped off of tredamill backwards   Doffed lite gait harness- LiteGait harness removed with patient in standing position. Due to increased abdominal pressure from harness, pt observed for dizziness post removal before transitioning to sitting for recovery.    Unless otherwise stated, SBA was provided and gait belt donned in order to ensure pt safety  Pt required occasional rest breaks due fatigue, PT was attentive to when pt appeared to be tired or winded in order to prevent excessive fatigue.   PATIENT EDUCATION: Education details: POC Person educated: Patient Education method: Explanation Education comprehension: verbalized understanding   HOME EXERCISE PROGRAM: Access Code: [email or web address] URL: [email or web address] Date: 01/25/2024 Prepared by: Lonni Gainer  Exercises -  Marching Near Counter  - 1 x daily - 4 x weekly - 2 sets - 10 reps - Sidestepping near counter   - 1 x daily - 4 x weekly - 2 sets - 10 reps - Mini Squat with Counter Support  - 1 x daily - 4 x weekly - 2 sets - 10 reps - Heel Raises with Counter Support  - 1 x daily - 4 x weekly - 2 sets - 10 reps  Access Code: [email or web address] URL: [email or web address] Date: 03/03/2024 Prepared by: Massie Dollar  Exercises - Sit to Stand with Arms Crossed  - 2 x weekly - 2 sets - 7 reps - Backwards Walking  - 1 x daily - 2 x weekly - 2 sets - 10 ft  hold - Heel Raises with Counter Support  - 1 x daily - 7 x weekly - 2 sets - 10 reps - Seated Heel Slide  - 1 x daily - 7 x weekly - 3 sets - 8 reps - Seated Hip  Abduction with Resistance  - 1 x daily - 7 x weekly - 3 sets - 10 reps - 2-3 seconds  hold - Clamshell  - 1 x daily - 7 x weekly - 3 sets - 10 reps - Sidelying Hip Extension in Abduction with Knee Bent  - 1 x daily - 7 x weekly - 3 sets - 10 reps - Seated Long Arc Quad  - 1 x daily - 7 x weekly - 3 sets - 10 reps   GOALS: Goals reviewed with patient? Yes  SHORT TERM GOALS: Target date: 02/17/2024   Patient will be independent in home exercise program to improve strength/mobility for better functional independence with ADLs. Baseline: No HEP currently  Goal status: INITIAL   LONG TERM GOALS: Target date: 04/13/2024  1.  Patient will complete five times sit to stand test in < 15 seconds indicating an increased LE strength and improved balance. Baseline: 5 times sit to stand: 63.92 sec - completes 2.5 reps with heavy UE assist   Goal status: INITIAL  2.  Patient will improve SIS 16 score to 55  to demonstrate statistically significant improvement in mobility and limitations as it relates to their QOL impact as a result of his CVA.  Baseline: 45.3 Goal status: INITIAL     3.   Patient will reduce timed up and go to <30 seconds to reduce fall risk and demonstrate improved  transfer/gait ability. Baseline:  82.58 se with RW  Goal status: INITIAL  4.   Patient will increase 10 meter walk test to >1.24m/s as to improve gait speed for better community ambulation and to reduce fall risk. Baseline:  68.43 sec with RW Goal status: INITIAL  5.   Patient will increase 2 minute walk test distance to >1000 for progression to community ambulator and improve gait ability Baseline: 75 ft with BRW - very fatigued  Goal status: INITIAL  ASSESSMENT:  CLINICAL IMPRESSION:  The patient is a 58 year old male status post CVA who continues to demonstrate gait impairments, decreased weight shifting, limited foot clearance and challenges with functional movement patterns. PT treatment focused on HEP review and expansion to allow increased focused activation of BLE L>R while sitting and lying in bed. See HEP for details. Encouraged use of tband or ace wrap to improve sensory feed back with HEP and walking trials at home as well as cues for focused activation of hip and knee extension in stance to reduce risk of knee buckling.  More consistent access to therapy is important to maintain momentum, ensure safe progression and optimize his long term functional outcomes.   OBJECTIVE IMPAIRMENTS: Abnormal gait, decreased balance, decreased coordination, decreased endurance, decreased mobility, difficulty walking, decreased strength, impaired perceived functional ability, and obesity.   ACTIVITY LIMITATIONS: carrying, lifting, standing, squatting, stairs, transfers, and locomotion level  PARTICIPATION LIMITATIONS: meal prep, driving, shopping, community activity, and yard work  PERSONAL FACTORS: Behavior pattern, Fitness, Time since onset of injury/illness/exacerbation, and 3+ comorbidities: CAD, CHF, DM, GERD, MI, HTN, HLD, obseity and 2 x CVA (10/24 and 3/25) are also affecting patient's functional outcome.   REHAB POTENTIAL: Good  CLINICAL DECISION MAKING: Evolving/moderate  complexity  EVALUATION COMPLEXITY: Moderate  PLAN:  PT FREQUENCY: 2x/week  PT DURATION: 12 weeks  PLANNED INTERVENTIONS: 97750- Physical Performance Testing, 97110-Therapeutic exercises, 97530- Therapeutic activity, 97112- Neuromuscular re-education, 97535- Self Care, [id number]- Manual therapy, [phone]- Gait training, Patient/Family education, Balance training, Stair training, Vestibular training, Visual/preceptual remediation/compensation, DME instructions, and Wheelchair mobility training  PLAN FOR NEXT SESSION:   Lite gait treadmill training for gait speed and endurance with functional strength intermingled   Massie FORBES Dollar, PT 03/03/2024, 10:52 AM        "

## 2024-03-03 NOTE — Progress Notes (Signed)
 [facility] [address] [place], [place] [address]  Internal MEDICINE  Office Visit Note  Patient Name: Gerald [name]  [id number]  [id number]  Date of Service: 03/03/2024  Chief Complaint  Patient presents with   Follow-up   Diabetes   Depression   Gastroesophageal Reflux   Hypertension   Hyperlipidemia    HPI Pt is here for routine follow up -disability approved after 11months, temporarily has medicaid in the meantime -taking 50mg  metoprolol  now from cardiology, for PVCs -Sugars have been more 175-180; and several higher readings. No lows.   -sliding scale typically 10-14units, twice per day. Long acting 34units, will increase to 36 and then plan to go up again if not improving after a few days will go up to 38 -taking all of his meds--jardiance , trulicity --needs refills. Also needs dexcom refills -Doing PT currently, neurology increased to 20mg  lexapro. Taking 5 caps of 300mg  gabapentin . Goes back in 1.5 months  Current Medication: Outpatient Encounter Medications as of 03/03/2024  Medication Sig   clopidogrel  (PLAVIX ) 75 MG tablet Take 1 tablet (75 mg total) by mouth daily.   cyclobenzaprine  (FLEXERIL ) 10 MG tablet TAKE 1 TABLET (10 MG TOTAL) BY MOUTH AT BEDTIME FOR BACK SPASM AS NEEDED ONLY   empagliflozin  (JARDIANCE ) 25 MG TABS tablet Take 1 tablet (25 mg total) by mouth daily before breakfast.   escitalopram (LEXAPRO) 20 MG tablet Take 20 mg by mouth daily.   Evolocumab  (REPATHA  SURECLICK) 140 MG/ML SOAJ Inject 140 mg into the skin every 14 (fourteen) days.   furosemide  (LASIX ) 20 MG tablet TAKE 1 TABLET BY MOUTH EVERY DAY AS NEEDED FOR LEG SWELLING   gabapentin  (NEURONTIN ) 300 MG capsule TAKE 1 CAPSULE BY MOUTH THREE TIMES A DAY   insulin  aspart (NOVOLOG  FLEXPEN) 100 UNIT/ML FlexPen Inject with meals per sliding scale. Max 14 units   Insulin  Pen Needle (BD PEN NEEDLE NANO U/F) 32G X 4 MM MISC Use as directed once a daily   lamoTRIgine  (LAMICTAL )  150 MG tablet Take 1 tablet (150 mg total) by mouth 2 (two) times daily.   lisinopril  (ZESTRIL ) 10 MG tablet Take 1 tablet (10 mg total) by mouth daily.   lisinopril -hydrochlorothiazide  (ZESTORETIC ) 20-25 MG tablet TAKE 1 TABLET BY MOUTH EVERY DAY   metoprolol  succinate (TOPROL  XL) 50 MG 24 hr tablet Take 1 tablet (50 mg total) by mouth daily.   Multiple Vitamins-Minerals (MULTIVITAMIN ADULTS PO) Take 1 tablet by mouth daily.   nitroGLYCERIN  (NITROSTAT ) 0.4 MG SL tablet Place 1 tablet (0.4 mg total) under the tongue every 5 (five) minutes x 3 doses as needed for chest pain.   nortriptyline (PAMELOR) 10 MG capsule Take 50 mg by mouth at bedtime.   nystatin -triamcinolone  ointment (MYCOLOG) Apply 1 Application topically 2 (two) times daily.   potassium chloride  (KLOR-CON  M) 10 MEQ tablet TAKE 1 TABLET BY MOUTH AS NEEDED WITH FUROSEMIDE . (ONLY FOR LOW POTASSIUM)   QUEtiapine (SEROQUEL) 200 MG tablet Take 200 mg by mouth at bedtime.   rosuvastatin  (CRESTOR ) 5 MG tablet Take 1 tablet (5 mg total) by mouth at bedtime.   sulfamethoxazole -trimethoprim  (BACTRIM  DS) 800-160 MG tablet TAKE 1 TABLET BY MOUTH EVERY 12 HOURS   Vibegron  (GEMTESA ) 75 MG TABS Take 1 tablet (75 mg total) by mouth daily.   [DISCONTINUED] Continuous Glucose Sensor (DEXCOM G7 SENSOR) MISC Use every 10 days as directed. Dx: E11.65.   [DISCONTINUED] Dulaglutide  (TRULICITY ) 4.5 MG/0.5ML SOAJ Inject 4.5 mg as directed once a week.   [DISCONTINUED] insulin   degludec (TRESIBA  FLEXTOUCH) 100 UNIT/ML FlexTouch Pen Inject 34 units in am sq   Continuous Glucose Sensor (DEXCOM G7 SENSOR) MISC Use every 10 days as directed. Dx: E11.65.   Dulaglutide  (TRULICITY ) 4.5 MG/0.5ML SOAJ Inject 4.5 mg as directed once a week.   insulin  degludec (TRESIBA  FLEXTOUCH) 100 UNIT/ML FlexTouch Pen Inject 36 units in am sq   No facility-administered encounter medications on file as of 03/03/2024.    Surgical History: Past Surgical History:  Procedure Laterality  Date   BONE EXCISION Right 11/21/2020   Procedure: PART EXCISION BONE- PHALANX;  Surgeon: Ashley Soulier, DPM;  Location: [facility];  Service: Podiatry;  Laterality: Right;   CARDIAC CATHETERIZATION N/A 09/2011   [facility]; EF 50% with 30% mid LAD stenosis and no obstructive disease.   CARDIAC CATHETERIZATION  09/2010   [facility]; Mid LAD 40% stenosis; Mid Circumflex:Normal; Mid RCA; Normal   CARDIAC CATHETERIZATION     COLONOSCOPY     COLONOSCOPY WITH PROPOFOL  N/A 04/10/2021   Procedure: COLONOSCOPY WITH PROPOFOL ;  Surgeon: Therisa Bi, MD;  Location: [facility];  Service: Gastroenterology;  Laterality: N/A;   ESOPHAGOGASTRODUODENOSCOPY N/A 04/10/2021   Procedure: ESOPHAGOGASTRODUODENOSCOPY (EGD);  Surgeon: Therisa Bi, MD;  Location: [facility] END[facility]COPY;  Service: Gastroenterology;  Laterality: N/A;   ESOPHAGOGASTRODUODENOSCOPY (EGD) WITH PROPOFOL  N/A 06/10/2017   Procedure: ESOPHAGOGASTRODUODENOSCOPY (EGD) WITH PROPOFOL ;  Surgeon: Therisa Bi, MD;  Location: [facility] END[facility]COPY;  Service: Gastroenterology;  Laterality: N/A;   ESOPHAGOGASTRODUODENOSCOPY (EGD) WITH PROPOFOL  N/A 01/01/2022   Procedure: ESOPHAGOGASTRODUODENOSCOPY (EGD) WITH PROPOFOL ;  Surgeon: Therisa Bi, MD;  Location: [facility] END[facility]COPY;  Service: Gastroenterology;  Laterality: N/A;   ESOPHAGOGASTRODUODENOSCOPY (EGD) WITH PROPOFOL  N/A 01/06/2022   Procedure: ESOPHAGOGASTRODUODENOSCOPY (EGD) WITH PROPOFOL ;  Surgeon: Therisa Bi, MD;  Location: [facility];  Service: Gastroenterology;  Laterality: N/A;   HAMMER TOE SURGERY Right 11/21/2020   Procedure: HAMMERTOE CORRECTION;  Surgeon: Ashley Soulier, DPM;  Location: [facility];  Service: Podiatry;  Laterality: Right;   INSERTION OF MESH  04/16/2021   Procedure: INSERTION OF MESH;  Surgeon: Desiderio Schanz, MD;  Location: [facility];  Service: General;;   KNEE ARTHROSCOPY WITH MEDIAL MENISECTOMY Right 09/16/2012   Procedure: RIGHT KNEE ARTHROSCOPY WITH MEDIAL AND LATERAL MENISECTOMY,  CHONDROPLASTY;  Surgeon: Toribio JULIANNA Chancy, MD;  Location: [facility];  Service: Orthopedics;  Laterality: Right;  RIGHT KNEE SCOPE MEDIAL MENISCECTOMY   LEFT HEART CATH AND CORONARY ANGIOGRAPHY N/A 06/11/2016   Procedure: Left Heart Cath and Coronary Angiography;  Surgeon: Perla Evalene PARAS, MD;  Location: [facility];  Service: Cardiovascular;  Laterality: N/A;   UMBILICAL HERNIA REPAIR N/A 04/16/2021   Procedure: HERNIA REPAIR UMBILICAL ADULT, open;  Surgeon: Desiderio Schanz, MD;  Location: [facility] OR[facility];  Service: General;  Laterality: N/A;    Medical History: Past Medical History:  Diagnosis Date   Abnormal LFTs    Anxiety    Atypical chest pain    Borderline diabetes    CAD (coronary artery disease)    a.) PCI and stent placement (unknown type) to mLAD and mLCx; date unknown. b.) LHC 09/10/2010: 40% mLAD; no intervention. c.) LHC 09/12/2011: EF 60%; 30% ISR mLAD, 40% dLAD, 30% pLCx, 30% ISR mLCx, 40% mRCA; no intervention. d.) LHC 05/23/2014: EF >55%; 30% p-mLAD; no intervention. e.) LHC 06/11/2016: EF 55-65%; LVEDP norm; 30-40% p-mLAD; no intervention.   Carcinoma (HCC)    CHF (congestive heart failure) (HCC)    Chronic pain syndrome    Depression    Diabetes mellitus without complication (  HCC)    Fundic gland polyps of stomach, benign    Gastritis    GERD (gastroesophageal reflux disease)    Hepatic steatosis    Hyperlipidemia    Hypertension    Migraines    Myocardial infarction [facility]) 2008   was told by PCP   Nonischemic cardiomyopathy (HCC)    a.) TTE 09/12/2011: EF 35-45%. b.) LHC 09/12/2011: EF 60%. c.) TTE 06/04/2012: EF 55-60%. d.) LHC 05/23/2014: EF >55%. e.) TTE 06/11/2016: EF 55-60%; G1DD. f.) LHC 06/11/2016: EF 55-65%. g.) TTE 01/08/2018: EF 60-65%.   Obesity    Occlusion and stenosis of bilateral carotid arteries    OSA on CPAP    Osteoarthritis of both knees    Shortness of breath    Squamous cell carcinoma of skin 02/27/2020   left distal lat  deltoid - [facility]   Stroke (HCC)    10/24, 3/25    Family History: Family History  Problem Relation Age of Onset   Heart disease Father 23       MI   Heart attack Father    Stomach cancer Father    Hypertension Mother    Breast cancer Mother     Social History   Socioeconomic History   Marital status: Married    Spouse name: Not on file   Number of children: 3   Years of education: Not on file   Highest education level: Not on file  Occupational History   Occupation: Camera Operator  Tobacco Use   Smoking status: Never    Passive exposure: Past   Smokeless tobacco: Former    Types: Chew    Quit date: 1987  Vaping Use   Vaping status: Never Used  Substance and Sexual Activity   Alcohol use: Not Currently   Drug use: No   Sexual activity: Yes  Other Topics Concern   Not on file  Social History Narrative   Married.  47 yo son.   Wife on disability for bipolar disorder.     Social Drivers of Health   Tobacco Use: Medium Risk (03/03/2024)   Patient History    Smoking Tobacco Use: Never    Smokeless Tobacco Use: Former    Passive Exposure: Past  Physicist, Medical Strain: Not on file  Food Insecurity: Food Insecurity Present (04/17/2023)   Hunger Vital Sign    Worried About Running Out of Food in the Last Year: Sometimes true    Ran Out of Food in the Last Year: Sometimes true  Transportation Needs: No Transportation Needs (04/17/2023)   PRAPARE - Administrator, Civil Service (Medical): No    Lack of Transportation (Non-Medical): No  Physical Activity: Not on file  Stress: Not on file  Social Connections: Not on file  Intimate Partner Violence: Not At Risk (04/17/2023)   Humiliation, Afraid, Rape, and Kick questionnaire    Fear of Current or Ex-Partner: No    Emotionally Abused: No    Physically Abused: No    Sexually Abused: No  Depression (PHQ2-9): Low Risk (09/25/2023)   Depression (PHQ2-9)    PHQ-2 Score: 0  Alcohol Screen: Not on file   Housing: Unknown (04/22/2023)   Received from [facility]   Epic    Unable to Pay for Housing in the Last Year: Not on file    Number of Times Moved in the Last Year: Not on file    At any time in the past 12 months, were you homeless or living  in a shelter (including now)?: No  Utilities: Not At Risk (04/17/2023)   [facility] Utilities    Threatened with loss of utilities: No  Health Literacy: Not on file      Review of Systems  Constitutional:  Positive for fatigue. Negative for chills and unexpected weight change.  HENT:  Negative for congestion, postnasal drip, rhinorrhea, sneezing and sore throat.   Eyes:  Negative for redness.  Respiratory:  Negative for cough, chest tightness and shortness of breath.   Cardiovascular:  Negative for chest pain and palpitations.  Gastrointestinal:  Negative for anal bleeding, constipation, diarrhea, nausea and vomiting.  Genitourinary:  Negative for dysuria.  Musculoskeletal:  Positive for arthralgias, back pain, gait problem and myalgias. Negative for joint swelling and neck pain.  Skin:  Negative for rash.  Neurological:  Positive for weakness, numbness and headaches.  Hematological:  Negative for adenopathy. Does not bruise/bleed easily.  Psychiatric/Behavioral:  Positive for behavioral problems (Depression) and sleep disturbance. Negative for self-injury and suicidal ideas. The patient is nervous/anxious.     Vital Signs: BP 135/70   Pulse 86   Temp 98 F (36.7 C)   Resp 16   Ht 6' 2 (1.88 m)   Wt (!) 334 lb (151.5 kg)   SpO2 97%   BMI 42.88 kg/m    Physical Exam Vitals and nursing note reviewed.  Constitutional:      Appearance: Normal appearance.  HENT:     Head: Normocephalic and atraumatic.  Eyes:     Extraocular Movements: Extraocular movements intact.  Cardiovascular:     Rate and Rhythm: Normal rate and regular rhythm.     Pulses: Normal pulses.     Heart sounds: Normal heart sounds.  Pulmonary:      Effort: Pulmonary effort is normal.     Breath sounds: Normal breath sounds.  Skin:    General: Skin is warm and dry.  Neurological:     General: No focal deficit present.     Mental Status: He is alert.     Gait: Gait abnormal.     Comments: Walking with cane  Psychiatric:        Mood and Affect: Mood normal.        Behavior: Behavior normal.        Assessment/Plan: 1. Type 2 diabetes mellitus with hyperglycemia, with long-term current use of insulin  (HCC) (Primary) - POCT HgB A1C is 8.8 which is up from 7.8 last visit. Increase tresiba  to 36units for 3-4 days, if sugar still elevated then increase to 38units. Continue sliding scale, jardiance , and trulicity  as prescribed. Continue with dexcom and notify office if any lows/ concerns - Dulaglutide  (TRULICITY ) 4.5 MG/0.5ML SOAJ; Inject 4.5 mg as directed once a week.  Dispense: 2 mL; Refill: 6 - Continuous Glucose Sensor (DEXCOM G7 SENSOR) MISC; Use every 10 days as directed. Dx: E11.65.  Dispense: 4 each; Refill: 12 - insulin  degludec (TRESIBA  FLEXTOUCH) 100 UNIT/ML FlexTouch Pen; Inject 36 units in am sq  2. PVC's (premature ventricular contractions) Followed by cardiology, on metoprolol   3. Essential hypertension Well controlled, continue current medications  4. History of stroke with current residual effects Followed by neurology and undergoing PT currently   General Counseling: Quintin oakland understanding of the findings of todays visit and agrees with plan of treatment. I have discussed any further diagnostic evaluation that may be needed or ordered today. We also reviewed his medications today. he has been encouraged to call the office with any questions or concerns  that should arise related to todays visit.    Orders Placed This Encounter  Procedures   POCT HgB A1C    Meds ordered this encounter  Medications   Dulaglutide  (TRULICITY ) 4.5 MG/0.5ML SOAJ    Sig: Inject 4.5 mg as directed once a week.     Dispense:  2 mL    Refill:  6   Continuous Glucose Sensor (DEXCOM G7 SENSOR) MISC    Sig: Use every 10 days as directed. Dx: E11.65.    Dispense:  4 each    Refill:  12   insulin  degludec (TRESIBA  FLEXTOUCH) 100 UNIT/ML FlexTouch Pen    Sig: Inject 36 units in am sq    This patient was seen by Tinnie Pro, PA-C in collaboration with Dr. Sigrid Bathe as a part of collaborative care agreement.   Total time spent:30 Minutes Time spent includes review of chart, medications, test results, and follow up plan with the patient.      Dr Fozia M Khan Internal medicine

## 2024-03-04 ENCOUNTER — Ambulatory Visit: Admitting: Physical Therapy

## 2024-03-04 ENCOUNTER — Telehealth: Payer: Self-pay

## 2024-03-08 ENCOUNTER — Telehealth: Payer: Self-pay

## 2024-03-09 ENCOUNTER — Ambulatory Visit: Admitting: Physical Therapy

## 2024-03-10 ENCOUNTER — Ambulatory Visit: Admitting: Physician Assistant

## 2024-03-10 ENCOUNTER — Encounter: Payer: Self-pay | Admitting: Physician Assistant

## 2024-03-10 VITALS — BP 120/68 | HR 85 | Temp 98.0°F | Resp 16 | Ht 74.0 in | Wt 327.4 lb

## 2024-03-10 DIAGNOSIS — K649 Unspecified hemorrhoids: Secondary | ICD-10-CM | POA: Diagnosis not present

## 2024-03-10 MED ORDER — HYDROCORTISONE ACETATE 25 MG RE SUPP: RECTAL | 0 refills | Status: AC

## 2024-03-10 NOTE — Progress Notes (Cosign Needed)
 " [facility] [address] [place], [place] [address]  Internal MEDICINE  Office Visit Note  Patient Name: Gerald [name]  [id number]  [id number]  Date of Service: 03/10/2024  Chief Complaint  Patient presents with   Acute Visit   Hemorrhoids     HPI Pt is here for a sick visit. -Thinks he has hemorrhoids -tried Lidocaine  cream which helped some, prepH, but still hurts when having BM -taking stool softener now bc was constipated -some blood on tissue last night, none in stool or dark tarry appearance  Current Medication:  Outpatient Encounter Medications as of 03/10/2024  Medication Sig   clopidogrel  (PLAVIX ) 75 MG tablet Take 1 tablet (75 mg total) by mouth daily.   Continuous Glucose Sensor (DEXCOM G7 SENSOR) MISC Use every 10 days as directed. Dx: E11.65.   cyclobenzaprine  (FLEXERIL ) 10 MG tablet TAKE 1 TABLET (10 MG TOTAL) BY MOUTH AT BEDTIME FOR BACK SPASM AS NEEDED ONLY   Dulaglutide  (TRULICITY ) 4.5 MG/0.5ML SOAJ Inject 4.5 mg as directed once a week.   empagliflozin  (JARDIANCE ) 25 MG TABS tablet Take 1 tablet (25 mg total) by mouth daily before breakfast.   escitalopram (LEXAPRO) 20 MG tablet Take 20 mg by mouth daily.   Evolocumab  (REPATHA  SURECLICK) 140 MG/ML SOAJ Inject 140 mg into the skin every 14 (fourteen) days.   furosemide  (LASIX ) 20 MG tablet TAKE 1 TABLET BY MOUTH EVERY DAY AS NEEDED FOR LEG SWELLING   gabapentin  (NEURONTIN ) 300 MG capsule TAKE 1 CAPSULE BY MOUTH THREE TIMES A DAY   insulin  aspart (NOVOLOG  FLEXPEN) 100 UNIT/ML FlexPen Inject with meals per sliding scale. Max 14 units   insulin  degludec (TRESIBA  FLEXTOUCH) 100 UNIT/ML FlexTouch Pen Inject 36 units in am sq   Insulin  Pen Needle (BD PEN NEEDLE NANO U/F) 32G X 4 MM MISC Use as directed once a daily   lamoTRIgine  (LAMICTAL ) 150 MG tablet Take 1 tablet (150 mg total) by mouth 2 (two) times daily.   lisinopril  (ZESTRIL ) 10 MG tablet Take 1 tablet (10 mg total) by mouth daily.    lisinopril -hydrochlorothiazide  (ZESTORETIC ) 20-25 MG tablet TAKE 1 TABLET BY MOUTH EVERY DAY   metoprolol  succinate (TOPROL  XL) 50 MG 24 hr tablet Take 1 tablet (50 mg total) by mouth daily.   Multiple Vitamins-Minerals (MULTIVITAMIN ADULTS PO) Take 1 tablet by mouth daily.   nitroGLYCERIN  (NITROSTAT ) 0.4 MG SL tablet Place 1 tablet (0.4 mg total) under the tongue every 5 (five) minutes x 3 doses as needed for chest pain.   nortriptyline (PAMELOR) 10 MG capsule Take 50 mg by mouth at bedtime.   nystatin -triamcinolone  ointment (MYCOLOG) Apply 1 Application topically 2 (two) times daily.   potassium chloride  (KLOR-CON  M) 10 MEQ tablet TAKE 1 TABLET BY MOUTH AS NEEDED WITH FUROSEMIDE . (ONLY FOR LOW POTASSIUM)   QUEtiapine (SEROQUEL) 200 MG tablet Take 200 mg by mouth at bedtime.   rosuvastatin  (CRESTOR ) 5 MG tablet Take 1 tablet (5 mg total) by mouth at bedtime.   sulfamethoxazole -trimethoprim  (BACTRIM  DS) 800-160 MG tablet TAKE 1 TABLET BY MOUTH EVERY 12 HOURS   Vibegron  (GEMTESA ) 75 MG TABS Take 1 tablet (75 mg total) by mouth daily.   No facility-administered encounter medications on file as of 03/10/2024.      Medical History: Past Medical History:  Diagnosis Date   Abnormal LFTs    Anxiety    Atypical chest pain    Borderline diabetes    CAD (coronary artery disease)    a.) PCI and  stent placement (unknown type) to mLAD and mLCx; date unknown. b.) LHC 09/10/2010: 40% mLAD; no intervention. c.) LHC 09/12/2011: EF 60%; 30% ISR mLAD, 40% dLAD, 30% pLCx, 30% ISR mLCx, 40% mRCA; no intervention. d.) LHC 05/23/2014: EF >55%; 30% p-mLAD; no intervention. e.) LHC 06/11/2016: EF 55-65%; LVEDP norm; 30-40% p-mLAD; no intervention.   Carcinoma (HCC)    CHF (congestive heart failure) (HCC)    Chronic pain syndrome    Depression    Diabetes mellitus without complication (HCC)    Fundic gland polyps of stomach, benign    Gastritis    GERD (gastroesophageal reflux disease)    Hepatic steatosis     Hyperlipidemia    Hypertension    Migraines    Myocardial infarction [facility]) 2008   was told by PCP   Nonischemic cardiomyopathy (HCC)    a.) TTE 09/12/2011: EF 35-45%. b.) LHC 09/12/2011: EF 60%. c.) TTE 06/04/2012: EF 55-60%. d.) LHC 05/23/2014: EF >55%. e.) TTE 06/11/2016: EF 55-60%; G1DD. f.) LHC 06/11/2016: EF 55-65%. g.) TTE 01/08/2018: EF 60-65%.   Obesity    Occlusion and stenosis of bilateral carotid arteries    OSA on CPAP    Osteoarthritis of both knees    Shortness of breath    Squamous cell carcinoma of skin 02/27/2020   left distal lat deltoid - [facility]   Stroke (HCC)    10/24, 3/25     Vital Signs: BP 120/68   Pulse 85   Temp 98 F (36.7 C)   Resp 16   Ht 6' 2 (1.88 m)   Wt (!) 327 lb 6.4 oz (148.5 kg)   SpO2 97%   BMI 42.04 kg/m    Review of Systems  Constitutional:  Negative for fatigue and fever.  HENT:  Negative for congestion, mouth sores and postnasal drip.   Respiratory:  Negative for cough.   Cardiovascular:  Negative for chest pain.  Gastrointestinal:  Positive for anal bleeding and constipation. Negative for blood in stool.  Genitourinary:  Negative for flank pain.  Psychiatric/Behavioral: Negative.      Physical Exam Vitals and nursing note reviewed.  Constitutional:      Appearance: Normal appearance.  HENT:     Head: Normocephalic and atraumatic.  Eyes:     Extraocular Movements: Extraocular movements intact.  Cardiovascular:     Rate and Rhythm: Normal rate and regular rhythm.     Heart sounds: Normal heart sounds.  Pulmonary:     Effort: Pulmonary effort is normal.     Breath sounds: Normal breath sounds.  Neurological:     General: No focal deficit present.     Mental Status: He is alert.     Gait: Gait abnormal.     Comments: Walking with cane  Psychiatric:        Mood and Affect: Mood normal.        Behavior: Behavior normal.       Assessment/Plan: 1. Bleeding hemorrhoids (Primary) May try anusol , advised to  discontinue if worsening. May use topical creams as before and avoid straining with BM. Will place urgent GI referral. - Ambulatory referral to Gastroenterology - hydrocortisone  (ANUSOL -HC) 25 MG suppository; Insert one after EACH bm QD  Dispense: 25 suppository; Refill: 0   General Counseling: Phuong verbalizes understanding of the findings of todays visit and agrees with plan of treatment. I have discussed any further diagnostic evaluation that may be needed or ordered today. We also reviewed his medications today. he has been encouraged to  call the office with any questions or concerns that should arise related to todays visit.    Counseling:    No orders of the defined types were placed in this encounter.   No orders of the defined types were placed in this encounter.   Time spent:25 Minutes "

## 2024-03-11 ENCOUNTER — Ambulatory Visit: Admitting: Physical Therapy

## 2024-03-16 ENCOUNTER — Ambulatory Visit: Admitting: Physical Therapy

## 2024-03-18 ENCOUNTER — Ambulatory Visit: Admitting: Physical Therapy

## 2024-03-23 ENCOUNTER — Ambulatory Visit: Admitting: Physical Therapy

## 2024-03-25 ENCOUNTER — Ambulatory Visit: Admitting: Physical Therapy

## 2024-03-30 ENCOUNTER — Ambulatory Visit: Admitting: Physical Therapy

## 2024-04-01 ENCOUNTER — Ambulatory Visit: Admitting: Physical Therapy

## 2024-04-06 ENCOUNTER — Ambulatory Visit: Admitting: Physical Therapy

## 2024-04-08 ENCOUNTER — Ambulatory Visit: Admitting: Physical Therapy

## 2024-04-13 ENCOUNTER — Ambulatory Visit: Admitting: Physical Therapy

## 2024-04-15 ENCOUNTER — Ambulatory Visit: Admitting: Physical Therapy

## 2024-04-20 ENCOUNTER — Ambulatory Visit: Admitting: Physical Therapy

## 2024-04-22 ENCOUNTER — Ambulatory Visit: Admitting: Physical Therapy

## 2024-04-28 ENCOUNTER — Ambulatory Visit: Admitting: Physician Assistant

## 2024-09-26 ENCOUNTER — Encounter: Admitting: Physician Assistant
